# Patient Record
Sex: Female | Born: 1950 | Race: White | Hispanic: No | Smoking: Former smoker
Health system: Southern US, Community
[De-identification: ages and names within clinical notes are randomized; demographics above are authoritative.]

## PROBLEM LIST (undated history)

## (undated) ENCOUNTER — Emergency Department (HOSPITAL_COMMUNITY): Admission: EM | Payer: Medicare Other | Source: Home / Self Care

## (undated) DIAGNOSIS — N189 Chronic kidney disease, unspecified: Secondary | ICD-10-CM

## (undated) DIAGNOSIS — I509 Heart failure, unspecified: Secondary | ICD-10-CM

## (undated) DIAGNOSIS — Z9889 Other specified postprocedural states: Secondary | ICD-10-CM

## (undated) DIAGNOSIS — Z9289 Personal history of other medical treatment: Secondary | ICD-10-CM

## (undated) DIAGNOSIS — K449 Diaphragmatic hernia without obstruction or gangrene: Secondary | ICD-10-CM

## (undated) DIAGNOSIS — F419 Anxiety disorder, unspecified: Secondary | ICD-10-CM

## (undated) DIAGNOSIS — F319 Bipolar disorder, unspecified: Secondary | ICD-10-CM

## (undated) DIAGNOSIS — F411 Generalized anxiety disorder: Secondary | ICD-10-CM

## (undated) DIAGNOSIS — M1990 Unspecified osteoarthritis, unspecified site: Secondary | ICD-10-CM

## (undated) DIAGNOSIS — Z8719 Personal history of other diseases of the digestive system: Secondary | ICD-10-CM

## (undated) DIAGNOSIS — F329 Major depressive disorder, single episode, unspecified: Secondary | ICD-10-CM

## (undated) DIAGNOSIS — R0600 Dyspnea, unspecified: Secondary | ICD-10-CM

## (undated) DIAGNOSIS — G4700 Insomnia, unspecified: Secondary | ICD-10-CM

## (undated) DIAGNOSIS — K2270 Barrett's esophagus without dysplasia: Secondary | ICD-10-CM

## (undated) DIAGNOSIS — G250 Essential tremor: Secondary | ICD-10-CM

## (undated) DIAGNOSIS — K222 Esophageal obstruction: Secondary | ICD-10-CM

## (undated) DIAGNOSIS — Z86010 Personal history of colonic polyps: Secondary | ICD-10-CM

## (undated) DIAGNOSIS — Z87442 Personal history of urinary calculi: Secondary | ICD-10-CM

## (undated) DIAGNOSIS — Z8489 Family history of other specified conditions: Secondary | ICD-10-CM

## (undated) DIAGNOSIS — I639 Cerebral infarction, unspecified: Secondary | ICD-10-CM

## (undated) DIAGNOSIS — I219 Acute myocardial infarction, unspecified: Secondary | ICD-10-CM

## (undated) DIAGNOSIS — M797 Fibromyalgia: Secondary | ICD-10-CM

## (undated) DIAGNOSIS — R011 Cardiac murmur, unspecified: Secondary | ICD-10-CM

## (undated) DIAGNOSIS — T4145XA Adverse effect of unspecified anesthetic, initial encounter: Secondary | ICD-10-CM

## (undated) DIAGNOSIS — J42 Unspecified chronic bronchitis: Secondary | ICD-10-CM

## (undated) DIAGNOSIS — Z8711 Personal history of peptic ulcer disease: Secondary | ICD-10-CM

## (undated) DIAGNOSIS — I359 Nonrheumatic aortic valve disorder, unspecified: Secondary | ICD-10-CM

## (undated) DIAGNOSIS — J449 Chronic obstructive pulmonary disease, unspecified: Secondary | ICD-10-CM

## (undated) DIAGNOSIS — J189 Pneumonia, unspecified organism: Secondary | ICD-10-CM

## (undated) DIAGNOSIS — I5181 Takotsubo syndrome: Secondary | ICD-10-CM

## (undated) DIAGNOSIS — T8859XA Other complications of anesthesia, initial encounter: Secondary | ICD-10-CM

## (undated) DIAGNOSIS — I499 Cardiac arrhythmia, unspecified: Secondary | ICD-10-CM

## (undated) DIAGNOSIS — K219 Gastro-esophageal reflux disease without esophagitis: Secondary | ICD-10-CM

## (undated) DIAGNOSIS — R112 Nausea with vomiting, unspecified: Secondary | ICD-10-CM

## (undated) DIAGNOSIS — F32A Depression, unspecified: Secondary | ICD-10-CM

## (undated) DIAGNOSIS — E785 Hyperlipidemia, unspecified: Secondary | ICD-10-CM

## (undated) DIAGNOSIS — D649 Anemia, unspecified: Secondary | ICD-10-CM

## (undated) DIAGNOSIS — E119 Type 2 diabetes mellitus without complications: Secondary | ICD-10-CM

## (undated) DIAGNOSIS — I10 Essential (primary) hypertension: Secondary | ICD-10-CM

## (undated) DIAGNOSIS — R350 Frequency of micturition: Secondary | ICD-10-CM

## (undated) HISTORY — DX: Esophageal obstruction: K22.2

## (undated) HISTORY — DX: Diaphragmatic hernia without obstruction or gangrene: K44.9

## (undated) HISTORY — DX: Depression, unspecified: F32.A

## (undated) HISTORY — DX: Cerebral infarction, unspecified: I63.9

## (undated) HISTORY — DX: Personal history of colonic polyps: Z86.010

## (undated) HISTORY — DX: Gastro-esophageal reflux disease without esophagitis: K21.9

## (undated) HISTORY — DX: Insomnia, unspecified: G47.00

## (undated) HISTORY — DX: Barrett's esophagus without dysplasia: K22.70

## (undated) HISTORY — DX: Essential tremor: G25.0

## (undated) HISTORY — PX: CARDIAC CATHETERIZATION: SHX172

## (undated) HISTORY — DX: Generalized anxiety disorder: F41.1

## (undated) HISTORY — PX: COLONOSCOPY W/ BIOPSIES AND POLYPECTOMY: SHX1376

## (undated) HISTORY — PX: CARPAL TUNNEL RELEASE: SHX101

## (undated) HISTORY — PX: CATARACT EXTRACTION W/ INTRAOCULAR LENS  IMPLANT, BILATERAL: SHX1307

## (undated) HISTORY — DX: Chronic obstructive pulmonary disease, unspecified: J44.9

## (undated) HISTORY — DX: Unspecified osteoarthritis, unspecified site: M19.90

## (undated) HISTORY — PX: DILATION AND CURETTAGE OF UTERUS: SHX78

## (undated) HISTORY — DX: Major depressive disorder, single episode, unspecified: F32.9

## (undated) HISTORY — DX: Nonrheumatic aortic valve disorder, unspecified: I35.9

## (undated) HISTORY — DX: Hyperlipidemia, unspecified: E78.5

## (undated) HISTORY — PX: TUBAL LIGATION: SHX77

## (undated) HISTORY — PX: VAGINAL HYSTERECTOMY: SUR661

## (undated) HISTORY — PX: WRIST FLEXION TENDON TENOTOMIES AND PROXIMAL CORPECTOMY W/ WRIST ARTHRODESIS&ILIAC CREST BONE GRAFT: SHX2672

## (undated) HISTORY — DX: Essential (primary) hypertension: I10

---
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
## 1999-04-24 ENCOUNTER — Emergency Department (HOSPITAL_COMMUNITY): Admission: EM | Admit: 1999-04-24 | Discharge: 1999-04-24 | Payer: Self-pay | Admitting: Emergency Medicine

## 1999-04-24 ENCOUNTER — Encounter: Payer: Self-pay | Admitting: Emergency Medicine

## 2001-03-16 ENCOUNTER — Emergency Department (HOSPITAL_COMMUNITY): Admission: EM | Admit: 2001-03-16 | Discharge: 2001-03-16 | Payer: Self-pay

## 2001-03-30 ENCOUNTER — Ambulatory Visit (HOSPITAL_COMMUNITY): Admission: RE | Admit: 2001-03-30 | Discharge: 2001-03-30 | Payer: Self-pay | Admitting: Cardiovascular Disease

## 2001-05-28 ENCOUNTER — Ambulatory Visit (HOSPITAL_COMMUNITY): Admission: RE | Admit: 2001-05-28 | Discharge: 2001-05-28 | Payer: Self-pay | Admitting: Cardiovascular Disease

## 2002-02-07 ENCOUNTER — Encounter: Admission: RE | Admit: 2002-02-07 | Discharge: 2002-02-08 | Payer: Self-pay | Admitting: Specialist

## 2002-02-20 ENCOUNTER — Emergency Department (HOSPITAL_COMMUNITY): Admission: EM | Admit: 2002-02-20 | Discharge: 2002-02-20 | Payer: Self-pay | Admitting: Emergency Medicine

## 2002-02-20 ENCOUNTER — Encounter: Payer: Self-pay | Admitting: Emergency Medicine

## 2002-03-01 HISTORY — PX: LAPAROSCOPIC CHOLECYSTECTOMY: SUR755

## 2002-03-13 ENCOUNTER — Encounter: Payer: Self-pay | Admitting: Surgery

## 2002-03-13 ENCOUNTER — Encounter (INDEPENDENT_AMBULATORY_CARE_PROVIDER_SITE_OTHER): Payer: Self-pay | Admitting: Specialist

## 2002-03-13 ENCOUNTER — Observation Stay (HOSPITAL_COMMUNITY): Admission: RE | Admit: 2002-03-13 | Discharge: 2002-03-13 | Payer: Self-pay | Admitting: Surgery

## 2003-02-07 ENCOUNTER — Encounter: Payer: Self-pay | Admitting: Cardiovascular Disease

## 2003-02-07 ENCOUNTER — Ambulatory Visit (HOSPITAL_COMMUNITY): Admission: RE | Admit: 2003-02-07 | Discharge: 2003-02-07 | Payer: Self-pay | Admitting: Cardiovascular Disease

## 2003-08-02 HISTORY — PX: HEEL SPUR SURGERY: SHX665

## 2004-07-08 ENCOUNTER — Encounter: Payer: Self-pay | Admitting: Cardiovascular Disease

## 2004-08-17 ENCOUNTER — Emergency Department (HOSPITAL_COMMUNITY): Admission: EM | Admit: 2004-08-17 | Discharge: 2004-08-17 | Payer: Self-pay | Admitting: *Deleted

## 2004-08-28 ENCOUNTER — Emergency Department (HOSPITAL_COMMUNITY): Admission: EM | Admit: 2004-08-28 | Discharge: 2004-08-28 | Payer: Self-pay | Admitting: Emergency Medicine

## 2005-03-16 ENCOUNTER — Ambulatory Visit (HOSPITAL_COMMUNITY): Admission: RE | Admit: 2005-03-16 | Discharge: 2005-03-16 | Payer: Self-pay | Admitting: Cardiovascular Disease

## 2005-09-21 ENCOUNTER — Emergency Department (HOSPITAL_COMMUNITY): Admission: EM | Admit: 2005-09-21 | Discharge: 2005-09-21 | Payer: Self-pay | Admitting: Emergency Medicine

## 2007-06-07 ENCOUNTER — Encounter: Admission: RE | Admit: 2007-06-07 | Discharge: 2007-09-05 | Payer: Self-pay | Admitting: Orthopedic Surgery

## 2007-10-04 ENCOUNTER — Emergency Department (HOSPITAL_COMMUNITY): Admission: EM | Admit: 2007-10-04 | Discharge: 2007-10-05 | Payer: Self-pay | Admitting: Emergency Medicine

## 2008-09-28 ENCOUNTER — Emergency Department (HOSPITAL_COMMUNITY): Admission: EM | Admit: 2008-09-28 | Discharge: 2008-09-29 | Payer: Self-pay | Admitting: Emergency Medicine

## 2009-08-01 HISTORY — PX: SHOULDER OPEN ROTATOR CUFF REPAIR: SHX2407

## 2009-10-22 ENCOUNTER — Emergency Department (HOSPITAL_COMMUNITY): Admission: EM | Admit: 2009-10-22 | Discharge: 2009-10-22 | Payer: Self-pay | Admitting: Emergency Medicine

## 2010-03-03 ENCOUNTER — Ambulatory Visit (HOSPITAL_COMMUNITY): Admission: AD | Admit: 2010-03-03 | Discharge: 2010-03-04 | Payer: Self-pay | Admitting: Orthopedic Surgery

## 2010-03-31 ENCOUNTER — Encounter
Admission: RE | Admit: 2010-03-31 | Discharge: 2010-06-29 | Payer: Self-pay | Source: Home / Self Care | Admitting: Orthopedic Surgery

## 2010-05-11 ENCOUNTER — Encounter: Payer: Self-pay | Admitting: Gastroenterology

## 2010-08-05 ENCOUNTER — Ambulatory Visit: Payer: Self-pay | Admitting: Cardiovascular Disease

## 2010-09-13 ENCOUNTER — Encounter (INDEPENDENT_AMBULATORY_CARE_PROVIDER_SITE_OTHER): Payer: Self-pay | Admitting: *Deleted

## 2010-09-22 NOTE — Letter (Signed)
Summary: New Patient letter  [facility]  [address]   [place], [place] [address]   Phone: [phone]  Fax: [phone]       09/13/2010 MRN: [id number]  [name] [name] [address] [place], [place]  [address]  Dear [name] [name],  Welcome to the Gastroenterology Division at [facility].    You are scheduled to see Dr.  Jarold Motto on 10/19/2010 at 8:30 on the 3rd floor at [facility], [address]  We ask that you try to arrive at our office 15 minutes prior to your appointment time to allow for check-in.  We would like you to complete the enclosed self-administered evaluation form prior to your visit and bring it with you on the day of your appointment.  We will review it with you.  Also, please bring a complete list of all your medications or, if you prefer, bring the medication bottles and we will list them.  Please bring your insurance card so that we may make a copy of it.  If your insurance requires a referral to see a specialist, please bring your referral form from your primary care physician.  Co-payments are due at the time of your visit and may be paid by cash, check or credit card.     Your office visit will consist of a consult with your physician (includes a physical exam), any laboratory testing he/she may order, scheduling of any necessary diagnostic testing (e.g. x-ray, ultrasound, CT-scan), and scheduling of a procedure (e.g. Endoscopy, Colonoscopy) if required.  Please allow enough time on your schedule to allow for any/all of these possibilities.    If you cannot keep your appointment, please call [phone] to cancel or reschedule prior to your appointment date.  This allows [place] the opportunity to schedule an appointment for another patient in need of care.  If you do not cancel or reschedule by 5 p.m. the business day prior to your appointment date, you will be charged a $50.00 late cancellation/no-show fee.    Thank you for choosing  [facility] for your medical needs.  We appreciate the opportunity to care for you.  Please visit [place] at our website  to learn more about our practice.                     Sincerely,                                                             The Gastroenterology Division

## 2010-09-30 DIAGNOSIS — Z860100 Personal history of colon polyps, unspecified: Secondary | ICD-10-CM

## 2010-09-30 HISTORY — DX: Personal history of colon polyps, unspecified: Z86.0100

## 2010-09-30 HISTORY — DX: Personal history of colonic polyps: Z86.010

## 2010-10-11 LAB — HM COLONOSCOPY

## 2010-10-15 LAB — GLUCOSE, CAPILLARY
Glucose-Capillary: 143 mg/dL — ABNORMAL HIGH (ref 70–99)
Glucose-Capillary: 174 mg/dL — ABNORMAL HIGH (ref 70–99)
Glucose-Capillary: 175 mg/dL — ABNORMAL HIGH (ref 70–99)
Glucose-Capillary: 217 mg/dL — ABNORMAL HIGH (ref 70–99)

## 2010-10-16 LAB — COMPREHENSIVE METABOLIC PANEL
AST: 16 U/L (ref 0–37)
Alkaline Phosphatase: 59 U/L (ref 39–117)
CO2: 27 mEq/L (ref 19–32)
Calcium: 9.1 mg/dL (ref 8.4–10.5)
Chloride: 107 mEq/L (ref 96–112)
Creatinine, Ser: 0.61 mg/dL (ref 0.4–1.2)
Sodium: 141 mEq/L (ref 135–145)
Total Protein: 6.2 g/dL (ref 6.0–8.3)

## 2010-10-16 LAB — CBC
HCT: 35.4 % — ABNORMAL LOW (ref 36.0–46.0)
MCV: 91.3 fL (ref 78.0–100.0)
RDW: 13.9 % (ref 11.5–15.5)

## 2010-10-16 LAB — URINALYSIS, ROUTINE W REFLEX MICROSCOPIC
Bilirubin Urine: NEGATIVE
Glucose, UA: NEGATIVE mg/dL
Nitrite: NEGATIVE
pH: 6.5 (ref 5.0–8.0)

## 2010-10-16 LAB — DIFFERENTIAL
Eosinophils Relative: 4 % (ref 0–5)
Lymphocytes Relative: 29 % (ref 12–46)
Lymphs Abs: 1.8 10*3/uL (ref 0.7–4.0)
Monocytes Absolute: 0.4 10*3/uL (ref 0.1–1.0)
Neutro Abs: 3.7 10*3/uL (ref 1.7–7.7)
Neutrophils Relative %: 60 % (ref 43–77)

## 2010-10-16 LAB — APTT: aPTT: 33 seconds (ref 24–37)

## 2010-10-19 ENCOUNTER — Ambulatory Visit (INDEPENDENT_AMBULATORY_CARE_PROVIDER_SITE_OTHER): Payer: Medicaid Other | Admitting: Gastroenterology

## 2010-10-19 ENCOUNTER — Encounter: Payer: Self-pay | Admitting: Gastroenterology

## 2010-10-19 DIAGNOSIS — E119 Type 2 diabetes mellitus without complications: Secondary | ICD-10-CM

## 2010-10-19 DIAGNOSIS — R1013 Epigastric pain: Secondary | ICD-10-CM

## 2010-10-19 DIAGNOSIS — Z800 Family history of malignant neoplasm of digestive organs: Secondary | ICD-10-CM

## 2010-10-19 DIAGNOSIS — K5900 Constipation, unspecified: Secondary | ICD-10-CM

## 2010-10-19 DIAGNOSIS — K219 Gastro-esophageal reflux disease without esophagitis: Secondary | ICD-10-CM

## 2010-10-19 MED ORDER — PEG-KCL-NACL-NASULF-NA ASC-C 100 G PO SOLR: 1.0000 | Freq: Once | ORAL | Status: DC

## 2010-10-19 MED ORDER — PEG-KCL-NACL-NASULF-NA ASC-C 100 G PO SOLR: 1.0000 | Freq: Once | ORAL | Status: AC

## 2010-10-19 NOTE — Progress Notes (Signed)
History of Present Illness:  This is a 60 year old Caucasian female referred  For evaluation of years of acid reflux endoscopy performed within 20 years ago. She now has sub-xiphoid pain with rather marked regurgitation and increasing coughing and possible asthma. She denies true dysphasia, anorexia or weight loss. She has abnormal ulcer diabetes and associated mild constipation , but no history of rectal bleeding or melena. She is status post cholecystectomy in 2005 for cholelithiasis. She has not had previous colonoscopy , and she has a brother that died of colon cancer at age 41. Review of her labs showed normal CBC and metabolic profile. There is no history of fatty liver or other hepatobiliary complaints. She currently is on Nexium with some improvement in her symptomatology. There is no history of pancreatitis or hepatitis. She does have chronic anxiety syndrome and is on regular Xanax. She also uses frequent tramadol for right shoulder pain.   Past Medical History  Diagnosis Date  . Insomnia, unspecified   . Esophageal reflux   . Type II or unspecified type diabetes mellitus without mention of complication, not stated as uncontrolled   . HTN (hypertension)   . Hyperlipemia   . Asthma   . Osteoporosis   . Arthritis   . COPD (chronic obstructive pulmonary disease)   . Depression   . Stroke    Past Surgical History  Procedure Date  . Cholecystectomy 2005  . Partial hysterectomy   . Shoulder surgery 2011  . Tubal ligation   . Cardiac catheterization   . Tonsillectomy   . Foot surgery 2005    Left foot    reports that she has been smoking Cigarettes.  She has a 20 pack-year smoking history. She does not have any smokeless tobacco history on file. She reports that she drinks about .6 ounces of alcohol per week. She reports that she does not use illicit drugs. family history includes Breast cancer in her mother; Colon cancer (age of onset:52) in her brother; Diabetes in her maternal  grandfather; Heart disease in an unspecified family member; and Kidney disease in an unspecified family member. Allergies  Allergen Reactions  . Boniva (Ibandronate Sodium)     Jaw popping  . Ceclor (Cefaclor)   . Codeine   . Penicillins     Current outpatient prescriptions:albuterol (PROVENTIL HFA;VENTOLIN HFA) 108 (90 BASE) MCG/ACT inhaler, Take two puffs four times a day as needed , Disp: , Rfl: ;  ALPRAZolam (XANAX) 0.25 MG tablet, Take 0.25 mg by mouth 3 (three) times daily as needed.  , Disp: , Rfl: ;  aspirin 81 MG tablet, Take 81 mg by mouth daily.  , Disp: , Rfl: ;  azelastine (OPTIVAR) 0.05 % ophthalmic solution, Apply 1 drop to eye 2 (two) times daily.  , Disp: , Rfl:  esomeprazole (NEXIUM) 40 MG capsule, Take 40 mg by mouth daily before breakfast.  , Disp: , Rfl: ;  Fluticasone-Salmeterol (ADVAIR DISKUS) 500-50 MCG/DOSE AEPB, One puff twice a day as needed , Disp: , Rfl: ;  furosemide (LASIX) 40 MG tablet, Take 40 mg by mouth daily.  , Disp: , Rfl: ;  Olopatadine HCl (PATADAY) 0.2 % SOLN, Apply to eye. Use in each eye as needed , Disp: , Rfl:  Omega-3 Fatty Acids (FISH OIL) 1000 MG CAPS, Take by mouth daily.  , Disp: , Rfl: ;  rosuvastatin (CRESTOR) 20 MG tablet, Take 20 mg by mouth daily.  , Disp: , Rfl: ;  sertraline (ZOLOFT) 50 MG tablet, Take 50  mg by mouth at bedtime.  , Disp: , Rfl: ;  Teriparatide, Recombinant, (FORTEO) 600 MCG/2.4ML SOLN, Inject into the skin daily.  , Disp: , Rfl: ;  zolpidem (AMBIEN) 10 MG tablet, Take 10 mg by mouth at bedtime.  , Disp: , Rfl:  dexlansoprazole (DEXILANT) 60 MG capsule, Take 60 mg by mouth daily.  , Disp: , Rfl: ;  peg 3350 electrolyte powder (MOVIPREP) 100 G SOLR, Take 1 kit (100 g total) by mouth once., Disp: 1 kit, Rfl: 0;  DISCONTD: peg 3350 electrolyte powder (MOVIPREP) 100 G SOLR, Take 1 kit (100 g total) by mouth once., Disp: 1 kit, Rfl: 0  ROS: she does have some urinary incontinence he but denies rectal incontinency. The remainder of the  8 point ROS is negative     Physical Exam: General well developed well nourished patient in no acute distress, appearing their stated age Eyes PERRLA, no icterus fundoscopic exam per opthamologist Skin no lesions noted Neck supple, no adenopathy, no thyroid enlargement, no tenderness Chest clear to percussion and auscultation Heart no significant murmurs, gallops or rubs noted Abdomen no hepatosplenomegaly masses or tenderness, BS normal.   there is no hepatosplenomegaly but there is mild tenderness the subxiphoid area. Rectal inspection normal no fissures, or fistulae noted.  No masses or tenderness on digital exam. Stool guaiac negative. Extremities no acute joint lesions, edema, phlebitis or evidence of cellulitis. Neurologic patient oriented x 3, cranial nerves intact, no focal neurologic deficits noted. Psychological mental status normal and normal affect.      Assessment and plan:  This patient has worsening acid reflux and a probable large hiatal hernia. I've scheduled endoscopic exam because of her abdominal pain. We need to exclude Barrett's mucosa. Also because of her constipation and family history of colon carcinoma in her brother at age 76 , I've scheduled colonoscopy exam. A reflex regime has been reviewed with the patient , also the risk and benefits of endoscopy and colonoscopy. She understands has agreed to proceed.

## 2010-10-19 NOTE — Patient Instructions (Addendum)
Your procedure has been scheduled for 10/20/2010, please follow the seperate instructions.  You were given a handout on gerd and hiatal hernia today.  Your prescription(s) have been sent to you pharmacy.

## 2010-10-20 ENCOUNTER — Ambulatory Visit (AMBULATORY_SURGERY_CENTER): Payer: Medicaid Other | Admitting: Gastroenterology

## 2010-10-20 ENCOUNTER — Encounter: Payer: Self-pay | Admitting: Gastroenterology

## 2010-10-20 VITALS — BP 130/62 | HR 57 | Temp 97.0°F | Resp 18

## 2010-10-20 DIAGNOSIS — D126 Benign neoplasm of colon, unspecified: Secondary | ICD-10-CM

## 2010-10-20 DIAGNOSIS — K2970 Gastritis, unspecified, without bleeding: Secondary | ICD-10-CM

## 2010-10-20 DIAGNOSIS — K317 Polyp of stomach and duodenum: Secondary | ICD-10-CM

## 2010-10-20 DIAGNOSIS — K5790 Diverticulosis of intestine, part unspecified, without perforation or abscess without bleeding: Secondary | ICD-10-CM

## 2010-10-20 DIAGNOSIS — K449 Diaphragmatic hernia without obstruction or gangrene: Secondary | ICD-10-CM

## 2010-10-20 DIAGNOSIS — Z800 Family history of malignant neoplasm of digestive organs: Secondary | ICD-10-CM

## 2010-10-20 DIAGNOSIS — R109 Unspecified abdominal pain: Secondary | ICD-10-CM

## 2010-10-20 DIAGNOSIS — K2270 Barrett's esophagus without dysplasia: Secondary | ICD-10-CM

## 2010-10-20 DIAGNOSIS — K2950 Unspecified chronic gastritis without bleeding: Secondary | ICD-10-CM

## 2010-10-20 DIAGNOSIS — K222 Esophageal obstruction: Secondary | ICD-10-CM

## 2010-10-20 DIAGNOSIS — Z1211 Encounter for screening for malignant neoplasm of colon: Secondary | ICD-10-CM

## 2010-10-20 DIAGNOSIS — K635 Polyp of colon: Secondary | ICD-10-CM

## 2010-10-20 NOTE — Patient Instructions (Signed)
Take carafate 20 minutes after each meal and at bedtime Call office to schedule 3 week follow up.  Teaching information given for dilation diet, high fiber diet,polyps, diverticulosis, hiatal hernia, barrett's esophagus,gastritis.

## 2010-10-21 ENCOUNTER — Encounter: Payer: Self-pay | Admitting: Gastroenterology

## 2010-10-21 ENCOUNTER — Telehealth: Payer: Self-pay | Admitting: *Deleted

## 2010-10-21 MED ORDER — SUCRALFATE 1 GM/10ML PO SUSP: 1.0000 g | Freq: Four times a day (QID) | ORAL | Status: DC

## 2010-10-21 NOTE — Telephone Encounter (Signed)
Call in carafate suspension 1pt,refill prn, 1tbs pc and qhs

## 2010-10-21 NOTE — Telephone Encounter (Signed)
Follow up Call- Patient questions:  Do you have a fever, pain , or abdominal swelling? no Pain Score  0 *  Have you tolerated food without any problems? yes  Have you been able to return to your normal activities? yes  Do you have any questions about your discharge instructions: Diet   no Medications  yes Follow up visit  yes  Do you have questions or concerns about your Care? no  Actions: * If pain score is 4 or above: No action needed, pain <4.

## 2010-10-21 NOTE — Telephone Encounter (Signed)
Scheduled pt for f/u with Dr Jarold Motto for 11/16/10 @1 :45pm. Ordered her Carafate. Pt stated understanding.

## 2010-10-21 NOTE — Telephone Encounter (Signed)
LMOM for pt to call back.

## 2010-10-21 NOTE — Telephone Encounter (Signed)
Follow up call to patient today. Patient stating she went to pharmacy no prescription had been called in. Carafate ordered but not sent. Informed patient a telephone note would be sent to MD for Rx.

## 2010-10-22 DIAGNOSIS — K2960 Other gastritis without bleeding: Secondary | ICD-10-CM

## 2010-10-22 LAB — HELICOBACTER PYLORI SCREEN-BIOPSY: UREASE: NEGATIVE

## 2010-10-25 ENCOUNTER — Encounter: Payer: Self-pay | Admitting: Gastroenterology

## 2010-10-27 ENCOUNTER — Encounter: Payer: Self-pay | Admitting: Gastroenterology

## 2010-10-27 LAB — HM DEXA SCAN

## 2010-10-28 NOTE — Procedures (Signed)
Summary: Upper Endoscopy  Patient: [name] [name] Note: All result statuses are Final unless otherwise noted.  Tests: (1) Upper Endoscopy (EGD)   EGD Upper Endoscopy       DONE     [facility]     [address]     [place], [place]  [address]          ENDOSCOPY PROCEDURE REPORT          PATIENT:  [name], [name]  MR#:  [id number]     BIRTHDATE:  July 13, 1951, 59 yrs. old  GENDER:  female          ENDOSCOPIST:  Vania Rea. Jarold Motto, MD, [facility]     Referred by:  Rudi Heap, M.D.          PROCEDURE DATE:  10/20/2010     PROCEDURE:  EGD with biopsy for H. pylori 19147     ASA CLASS:  Class II     INDICATIONS:  GERD, dysphagia          MEDICATIONS:   There was residual sedation effect present from     prior procedure., Fentanyl 50 mcg IV, Versed 6 mg IV     TOPICAL ANESTHETIC:  Exactacain Spray          DESCRIPTION OF PROCEDURE:   After the risks benefits and     alternatives of the procedure were thoroughly explained, informed     consent was obtained.  The LB GIF-H180 [id number] endoscope was     introduced through the mouth and advanced to the second portion of     the duodenum, without limitations.  The instrument was slowly     withdrawn as the mucosa was fully examined.     <<PROCEDUREIMAGES>>          A hiatal hernia was found. 5 CM HH NOTED  Barrett's esophagus was     found.  Mild gastritis was found in the body and the antrum of the     stomach. CLO BX.DONE  A stricture was found at the     gastroesophageal junction. DILATED #75f DILATOR.SOME HEME,NO PAIN.     Retroflexed views revealed a hiatal hernia.    The scope was then     withdrawn from the patient and the procedure completed.          COMPLICATIONS:  None          ENDOSCOPIC IMPRESSION:     1) Hiatal hernia     2) Barrett's esophagus     3) Mild gastritis in the body and the antrum of the stomach     4) Stricture at the gastroesophageal junction     5) A hiatal hernia     1.R/O H.PYLORI   2.GERD AND STRICTURE     3.GASTRIC POLYPS.R/O ADENOMAS     RECOMMENDATIONS:     1) Await biopsy results     2) Clear liquids until, then soft foods rest iof day. Resume     prior diet tomorrow.     3) Rx CLO if positive     4) continue PPI          REPEAT EXAM:  No          ______________________________     Vania Rea. Jarold Motto, MD, [name]          CC:          n.     eSIGNED:   Vania Rea. Patterson at 10/20/2010 03:01 PM  [name], [name], [id number]  Note: An exclamation mark (!) indicates a result that was not dispersed into the flowsheet. Document Creation Date: 10/20/2010 3:01 PM _______________________________________________________________________  (1) Order result status: Final Collection or observation date-time: 10/20/2010 14:48 Requested date-time:  Receipt date-time:  Reported date-time:  Referring Physician:   Ordering Physician: Sheryn Bison [phone]) Specimen Source:  Source: Launa Grill Order Number: [id number] Lab site:

## 2010-10-28 NOTE — Procedures (Signed)
Summary: Colonoscopy  Patient: [name] Welcome Note: All result statuses are Final unless otherwise noted.  Tests: (1) Colonoscopy (COL)   COL Colonoscopy           DONE     [facility]     [address]     [place], [place]  [address]          COLONOSCOPY PROCEDURE REPORT          PATIENT:  [name] [name], [name] [name]  MR#:  [id number]     BIRTHDATE:  06-Jul-1951, 59 yrs. old  GENDER:  female     ENDOSCOPIST:  Vania Rea. Jarold Motto, MD, [facility]     REF. BY:  Rudi Heap, M.D.     PROCEDURE DATE:  10/20/2010     PROCEDURE:  Colonoscopy with snare polypectomy     ASA CLASS:  Class II     INDICATIONS:  Elevated Risk Screening brother with colon ca age     32.     MEDICATIONS:   Fentanyl 100 mg IV NEEDSPROPOFOL FOR NEXT CASE.,     Versed 10 mg IV          DESCRIPTION OF PROCEDURE:   After the risks benefits and     alternatives of the procedure were thoroughly explained, informed     consent was obtained.  Digital rectal exam was performed and     revealed no abnormalities.   The LB CF-H180AL [id number] endoscope     was introduced through the anus and advanced to the cecum, which     was identified by both the appendix and ileocecal valve, limited     by poor preparation.    The quality of the prep was poor, using     MoviPrep.  The instrument was then slowly withdrawn as the colon     was fully examined.     <<PROCEDUREIMAGES>>          FINDINGS:  There were multiple polyps identified and removed. 5-7     mm flat right colon and rectal polyps hot snare excised.see     pictures.  Mild diverticulosis was found in the sigmoid colon.     Retroflexed views in the rectum revealed no abnormalities.    The     scope was then withdrawn from the patient and the procedure     completed.          COMPLICATIONS:  None     ENDOSCOPIC IMPRESSION:     1) Polyps, multiple     2) Mild diverticulosis in the sigmoid colon     r/o adenomas.poor prep per n and v.     RECOMMENDATIONS:     1) Await  biopsy results     2) Repeat Colonoscopy in 3 years.     3) high fiber diet     REPEAT EXAM:  No          ______________________________     Vania Rea. Jarold Motto, MD, [name]          CC:          n.     eSIGNED:   Vania Rea. Jakerra Floyd at 10/20/2010 02:53 PM          Madison Hickman, [id number]  Note: An exclamation mark (!) indicates a result that was not dispersed into the flowsheet. Document Creation Date: 10/20/2010 2:53 PM _______________________________________________________________________  (1) Order result status: Final Collection or observation date-time: 10/20/2010 14:33 Requested date-time:  Receipt date-time:  Reported  date-time:  Referring Physician:   Ordering Physician: Sheryn Bison [id number]) Specimen Source:  Source: Launa Grill Order Number: [id number] Lab site:

## 2010-11-11 LAB — CK TOTAL AND CKMB (NOT AT ARMC)
CK, MB: 3.9 ng/mL (ref 0.3–4.0)
Relative Index: 2.6 — ABNORMAL HIGH (ref 0.0–2.5)

## 2010-11-11 LAB — TROPONIN I: Troponin I: <0.01 ng/mL (ref 0.00–0.06)

## 2010-11-16 ENCOUNTER — Encounter: Payer: Self-pay | Admitting: Gastroenterology

## 2010-11-16 ENCOUNTER — Ambulatory Visit (INDEPENDENT_AMBULATORY_CARE_PROVIDER_SITE_OTHER): Payer: Medicaid Other | Admitting: Gastroenterology

## 2010-11-16 VITALS — BP 130/68 | HR 64 | Ht 61.0 in | Wt 176.0 lb

## 2010-11-16 DIAGNOSIS — K222 Esophageal obstruction: Secondary | ICD-10-CM

## 2010-11-16 DIAGNOSIS — Z860100 Personal history of colon polyps, unspecified: Secondary | ICD-10-CM

## 2010-11-16 DIAGNOSIS — K219 Gastro-esophageal reflux disease without esophagitis: Secondary | ICD-10-CM

## 2010-11-16 LAB — URINALYSIS, ROUTINE W REFLEX MICROSCOPIC
Ketones, ur: NEGATIVE mg/dL
Protein, ur: NEGATIVE mg/dL
Urobilinogen, UA: 0.2 mg/dL (ref 0.0–1.0)

## 2010-11-16 LAB — CBC
RBC: 4.04 MIL/uL (ref 3.87–5.11)
WBC: 6.8 10*3/uL (ref 4.0–10.5)

## 2010-11-16 LAB — POCT I-STAT, CHEM 8
Chloride: 109 mEq/L (ref 96–112)
Creatinine, Ser: 0.6 mg/dL (ref 0.4–1.2)
Glucose, Bld: 182 mg/dL — ABNORMAL HIGH (ref 70–99)
HCT: 38 % (ref 36.0–46.0)
Potassium: 3.6 mEq/L (ref 3.5–5.1)
Sodium: 140 mEq/L (ref 135–145)

## 2010-11-16 LAB — HEPATIC FUNCTION PANEL
ALT: 29 U/L (ref 0–35)
AST: 29 U/L (ref 0–37)
Albumin: 4 g/dL (ref 3.5–5.2)
Bilirubin, Direct: <0.1 mg/dL (ref 0.0–0.3)

## 2010-11-16 LAB — DIFFERENTIAL
Lymphocytes Relative: 32 % (ref 12–46)
Lymphs Abs: 2.1 10*3/uL (ref 0.7–4.0)
Monocytes Relative: 7 % (ref 3–12)
Neutro Abs: 4 10*3/uL (ref 1.7–7.7)
Neutrophils Relative %: 57 % (ref 43–77)

## 2010-11-16 LAB — CK TOTAL AND CKMB (NOT AT ARMC)
CK, MB: 5.3 ng/mL — ABNORMAL HIGH (ref 0.3–4.0)
Relative Index: 2.4 (ref 0.0–2.5)

## 2010-11-16 LAB — RAPID URINE DRUG SCREEN, HOSP PERFORMED
Amphetamines: NOT DETECTED
Barbiturates: NOT DETECTED

## 2010-11-16 LAB — POCT CARDIAC MARKERS
Myoglobin, poc: 59.6 ng/mL (ref 12–200)
Troponin i, poc: <0.05 ng/mL (ref 0.00–0.09)

## 2010-11-16 MED ORDER — DEXLANSOPRAZOLE 60 MG PO CPDR: 60.0000 mg | DELAYED_RELEASE_CAPSULE | Freq: Every day | ORAL | Status: DC

## 2010-11-16 NOTE — Patient Instructions (Signed)
Your prescription(s) have been sent to you pharmacy.  Today you watched a movie on GERD.

## 2010-11-16 NOTE — Progress Notes (Signed)
History of Present Illness: This is a 60 year old Caucasian female with acid reflux who had recent endoscopy and colonoscopy and esophageal dilatation. She currently is asymptomatic on Dexilant 2 mg a day. At the time of colonoscopy she had several adenomatous polyps removed. Currently she is having regular bowel movements without abdominal pain, dysphagia, or reflux symptoms.    Current Medications, Allergies, Past Medical History, Past Surgical History, Family History and Social History were reviewed in [employer] record.   Assessment and plan: Reflux regime review with patient and patient video shown for her patient education. She is to continue Dexilant  and a high  fiber diet as tolerated. We will do when necessary dilations, followup colonoscopy in 5 years time. She is to continue other multiple medications listed and reviewed her chart per primary care. Encounter Diagnosis  Name Primary?  . Esophageal reflux Yes

## 2010-12-03 ENCOUNTER — Encounter: Payer: Self-pay | Admitting: Physician Assistant

## 2010-12-17 NOTE — H&P (Signed)
[place]  Patient:    [name] [name], [name] [name] Visit Number: [id number] MRN: [id number]          Service Type: EMS Location: [facility] Attending Physician:  Lorre Nick Dictated by:   Sandria Bales. Ezzard Standing, M.D. Admit Date:  02/20/2002   CC:         Thelma Barge, M.D., of [place] [facility]. Eden Emms, M.D. [facility]  Vesta Mixer, Montez Hageman., M.D.  Clinton D. Maple Hudson, M.D.  Maretta Bees. Vonita Moss, M.D.   History and Physical  DATE OF BIRTH:  1950-12-14  HISTORY OF PRESENT ILLNESS:  This is a 60 year old white female who is a patient of Dr. Thelma Barge of [place].  She started developing abdominal pain this morning with nausea and vomiting.  She called Dr. Orvan Falconer but never got an answer and came to the [facility] emergency room.  I was called after an evaluation revealed gallstones.  She denies any history of fatty food intolerance or prior stomach ulcers though she is on Nexium for some indigestion she has had.  No history of liver disease, pancreatic disease, or colon disease.  PAST MEDICAL HISTORY:  She has allergies to CECLOR and PENICILLIN by her history.  CURRENT MEDICATIONS:  She is not sure of all of them but includes nitroglycerin, cholesterol medicines, potassium, Nexium, fluid pill, Advair for her lungs, Flonase, Dextral LA, Plavix, and aspirin.  She is unsure of the doses of all these medicines.  PRIOR OPERATIONS:  Tubal ligation.  She had a partial hysterectomy in 1987 by an unknown physician.  She had kidney stone surgery in [place] in 1972 by an unknown physician, tonsils and adenoids in 1963.  She had a right arm ligament tear and repaired by Dr. Montez Morita in 1997.  She had a cardiac catheterization by Dr. Kristeen Miss in August 2002.  REVIEW OF SYSTEMS:  NEUROLOGIC:  She claims to have had two strokes when she was in her 27s some 20 years ago.  The etiology of these strokes is unclear. 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 I was able to find Dr. Namon Cirri note from a cardiac catheterization of August 2002, about a year ago, in which she had an ejection fraction of 55% and smooth and normal-appearing coronary arteries.  PULMONARY:  She has a history of asthma.  She is followed by Dr. Fannie Knee.  She quit smoking just two weeks ago.  GASTROINTESTINAL:  See history of present illness.  UROLOGIC:  She has had a history of kidney stones.  Again, she is followed by Dr. Larey Dresser most recently.  She is accompanied by her husband in the emergency room.  PHYSICAL EXAMINATION:  VITAL SIGNS:  Her temperature is 98.2, blood pressure 118/66, pulse 69, respirations 20.  GENERAL:  She is a well-nourished, mildly obese white female, alert and cooperative.  HEENT:  Unremarkable.  NECK:  Supple.  I feel no mass, no thyromegaly.  LUNGS:  Clear to auscultation.  HEART:  Regular rate and rhythm without murmur or rub.  ABDOMEN:  Soft at this time.  She has no guarding, no tenderness, no rebound.  RECTAL:  Exam reveals guaiac-negative stools.  EXTREMITIES:  Good strength in all four extremities.  NEUROLOGIC:  Grossly intact.  LABORATORY DATA:  Labs that I have show a white blood count  of 9300, a hemoglobin 13, hematocrit 39.  Her sodium is 137, potassium 3.8, chloride 109, CO2 20.  Her liver functions are all within normal limits.  Her urinalysis is unremarkable.  Her amylase is 51.  I reviewed her ultrasound which does show large gallstones which appear to be impacted.  IMPRESSION: 1. My feeling is that she is having and had biliary colic and she needs to    have a laparoscopic cholecystectomy but, first, that she is on two    medicines which are anticoagulants, the aspirin and the Plavix.  We could    just stop those for five to seven days before any surgery.  She is not    acutely at this time  and can be set up for elective surgery in the next two    to three weeks and I discussed with her.  I told her to stay on a low-fat    diet.  She will go to my office today, will get records from Dr. Elease Hashimoto,    Dr. Orvan Falconer, to make sure there are no other medical problems. 2. History of strokes but no permanent problems. 3. History of "MIs" but with recent cardiac catheterization which is reported    as negative. 4. Hypercholesterolemia. 5. Asthma followed by Dr. Maple Hudson. 6. Bladder problems followed by Dr. Larey Dresser. Dictated by:   Sandria Bales. Ezzard Standing, M.D. Attending Physician:  Lorre Nick DD:  02/20/02 TD:  02/20/02 Job: [id number]

## 2010-12-17 NOTE — Cardiovascular Report (Signed)
[name], [name]               ACCOUNT NO.:  [id number]   MEDICAL RECORD NO.:  [id number]          PATIENT TYPE:  OIB   LOCATION:  2853                         FACILITY:  [facility]   PHYSICIAN:  Vesta Mixer, M.D. DATE OF BIRTH:  Mar 29, 1951   DATE OF PROCEDURE:  03/16/2005  DATE OF DISCHARGE:                              CARDIAC CATHETERIZATION   Ms. [name] [name] is a 60 year old female with a history of syncope and episodes of  shortness breath.  She also has occasional episodes of chest tightness.  She  is referred for heart catheterization for further evaluation.   PROCEDURE:  Left heart catheterization with coronary angiography.   The right femoral artery was easily cannulated using a modified Seldinger  technique.   HEMODYNAMIC RESULTS:  LV pressure is 116/9 with an aortic pressure of  114/57.   ANGIOGRAPHY:  Left main:  The left main is smooth and normal.   The left anterior descending artery has only minimal luminal irregularities  between 5 and 10%.  There are certainly no clinically imported obstructions.  The diagonal vessels were relatively small throughout, otherwise  unremarkable.   The left circumflex artery is normal.  The obtuse marginal artery is normal.   The right coronary artery is moderate in size and is normal.  The posterior  descending artery the and posterolateral segment arteries were normal.   The left ventriculogram was performed in a 30 RAO position.  It revealed  left ventricle that is at the upper limits of normal.  The left ventricular  systolic function is at the lower limits of normal with an EF of around 50  and perhaps 55%.  There is no significant mitral regurgitation.   COMPLICATIONS:  None.   CONCLUSION:  1.  Essentially normal coronary arteries.  2.  Lower limits of normal left ventricular systolic function.  I do not      think that her symptoms are coming from a cardiac etiology.           ______________________________  Vesta Mixer, M.D.    PJN/MEDQ  D:  03/16/2005  T:  03/16/2005  Job:  [id number]   cc:   Orvan Falconer, M.D.  [place], [place]

## 2010-12-17 NOTE — Cardiovascular Report (Signed)
[facility]  Patient:    CHAYNA, SURRATT Visit Number: [id number] MRN: [id number]          Service Type: Attending:  Alvia Grove., M.D. Dictated by:   Alvia Grove., M.D. Proc. Date: 03/30/01   CC:         Cardiac Catheterization Lab   Cardiac Catheterization  HISTORY:  Mrs. Margo Aye is a 60 year old female with a recent onset of chest pain. She complains of exertional chest pain with even minor exertion.  She was referred for a heart catheterization for further evaluation.  CARDIOLOGIST:  Alvia Grove., M.D.  PROCEDURE:  Left heart catheterization with coronary angiography.  DESCRIPTION OF PROCEDURE:  The right femoral artery was easily cannulated using a modified Seldinger technique.  HEMODYNAMICS:  The left ventricular pressure was 113/18 with an aortic pressure was 108/62.  ANGIOGRAPHY:  The left main coronary artery is fairly short.  There is an ostial stenosis of approximately 20%.  There was some ventricularization of the 6-French catheter.  The 5-French catheter fit fairly well.  The left anterior descending artery is fairly smooth and normal.   It gives off several moderate size diagonal branches which are normal.  The first diagonal is a fairly large vessel; however, it is normal.  It is smooth and is without stenoses.  The left circumflex artery is a moderate size vessel and is normal.  The first obtuse marginal artery is normal.  The right coronary artery is large and dominant.  The posterior descending artery and the posterolateral segment artery are normal.  LEFT VENTRICULOGRAM:  A left ventriculogram was performed in the 30 RAO position.  It reveals overall normal left ventricular systolic function.  The ejection fraction is approximately 55%.  There is no mitral regurgitation. COMPLICATIONS:  None.  CONCLUSIONS: 1. Smooth and normal coronary arteries.  The mild narrowing of the left main  appears to be a fairly normal variant.  She does not appear to have    significant plaque. 2. Normal left ventricular systolic function. Dictated by:   Alvia Grove., M.D. Attending:  Alvia Grove., M.D. DD:  03/30/01 TD:  03/30/01 Job: [id number]

## 2010-12-17 NOTE — H&P (Signed)
[name] [name]               ACCOUNT NO.:  [id number]   MEDICAL RECORD NO.:  [id number]          PATIENT TYPE:  OIB   LOCATION:                               FACILITY:  [facility]   PHYSICIAN:  Vesta Mixer, M.D. DATE OF BIRTH:  October 06, 1950   DATE OF ADMISSION:  03/16/2005  DATE OF DISCHARGE:                                HISTORY & PHYSICAL   [name] [name] is a middle-aged female with a history of mild to moderate coronary  artery disease by heart catheterization in 2002.  She has continued to have  intermittent episodes of severe shortness of breath and some chest  discomfort.  She took several nitroglycerin last week because of her  episodes of chest discomfort.  She had a stress Cardiolite study recently  during which she was only able to get her heart rate up to 118.  She has had  episodes of weakness and dizziness.  She has not had any significant  arrhythmias on a [place] monitor.  She is now scheduled for heart  catheterization for further evaluation.   She denies any PND or orthopnea.  She is very intolerant of exercise.   CURRENT MEDICATIONS:  1.  Flonase twice a day.  2.  Nexium 40 mg p.o. b.i.d.  3.  Aspirin 81 mg a day.  4.  Zocor 80 mg a day.  5.  Furosemide 40 mg every other day.  6.  Fish oil once a day.  7.  Vitamin E, B12, and calcium once a day.   ALLERGIES:  None.   PAST MEDICAL HISTORY:  1.  History of generalized weakness.  2.  Diabetes mellitus.  3.  Hyperlipidemia.  4.  History of syncope.  5.  History of bradycardia.   FAMILY HISTORY:  Her father died at age 16 due to complications of  congestive heart failure.  Her mother has a history of aortic aneurysm.   REVIEW OF SYSTEMS:  Was reviewed and is essentially negative.   PHYSICAL EXAMINATION:  GENERAL:  She is a middle-aged white female in no  acute distress.  She is alert and oriented x3 and her mood and affect are  normal.  VITAL SIGNS:  Weight 170, blood pressure 118/70 with a heart  rate of 72.  HEENT:  2+ carotids.  She has no bruits.  No JVD.  No thyromegaly.  LUNGS:  Clear to auscultation.  HEART:  Regular rate.  S1, S2 with no murmurs, rubs, or gallops.  ABDOMEN:  Good bowel sounds.  It is nontender.  EXTREMITIES:  She has no clubbing, cyanosis, edema.  NEUROLOGIC:  Nonfocal.   [name] [name] presents with persistent episodes of weakness, dizziness, and now  has some chest pain relieved with nitroglycerin.  Given these symptoms I  would like to proceed with a heart catheterization.  We have discussed the  risks, benefits, and options of heart catheterization.  She understands and  agrees to proceed.  We will plan on doing an elective heart catheterization  within the week or so.  ______________________________  Vesta Mixer, M.D.     PJN/MEDQ  D:  03/07/2005  T:  03/07/2005  Job:  [id number]

## 2010-12-17 NOTE — Op Note (Signed)
[name] [name], [name] [name]                        ACCOUNT NO.:  [id number]   MEDICAL RECORD NO.:  [id number]                   PATIENT TYPE:  OBV   LOCATION:  0252                                 FACILITY:  [facility]   PHYSICIAN:  Sandria Bales. Ezzard Standing, M.D.               DATE OF BIRTH:  1950/08/31   DATE OF PROCEDURE:  03/13/2002  DATE OF DISCHARGE:  03/13/2002                                 OPERATIVE REPORT   CCS#:  [id number]   PREOPERATIVE DIAGNOSIS:  Chronic cholecystitis with cholelithiasis.   POSTOPERATIVE DIAGNOSIS:  Chronic cholecystitis with cholelithiasis.   PROCEDURE:  Laparoscopic cholecystectomy with intraoperative cholangiogram.   SURGEON:  Sandria Bales. Ezzard Standing, M.D.   FIRST ASSISTANT:  Sharlet Salina T. Hoxworth, M.D.   ANESTHESIA:  General endotracheal.   ESTIMATED BLOOD LOSS:  Minimal.   INDICATIONS FOR PROCEDURE:  The patient is a 60 year old white female  patient of Dr. Thelma Barge [place] who presented to the  [facility] Emergency Room on February 20, 2002, with abdominal pain. An  ultrasound at that time revealed large gallstones which appeared to be  impacting the neck of the gallbladder. She had normal liver functions.   She was sent home to be set up for elective laparoscopic cholecystectomy.  She was on Plavix and aspirin and I have asked her to stop both of these for  one week prior to her operation.   DESCRIPTION OF PROCEDURE:  The patient was placed in the supine position,  given a general endotracheal anesthetic. Her abdomen was prepped with  Betadine solution and sterilely draped. She was given 1 gm of Ancef at the  initiation of the procedure and she has PS stockings in place.   An infraumbilical incision was made with sharp dissection and carried down  to the abdominal cavity. A 30 degree laparoscope was inserted through a 10  mm Hasson trocar and the Hasson trocar secured with a #0 Vicryl suture. A  laparoscopic examination of her abdomen revealed  some adhesions in the lower  part of her abdomen. Her liver was unremarkable, stomach was unremarkable,  and what I could see of her bowel was unremarkable. Extra trocars were  placed, a 10 mm Ethicon trocar in the subxiphoid location and a 5 mm Ethicon  trocar in the right mid subcostal and a 5 mm Ethicon trocar in the right  lateral subcostal location. The gallbladder was grasped, rotated cephalad,  dissection carried out identifying the cystic duct/gallbladder junction.  There was noted to be a fair amount of fibrous tissue around this area  consistent with chronic cholecystitis.   The cystic artery was identified, triply Endo clipped and divided. The  cystic duct was identified, a clip placed on the gallbladder side of the  cystic duct. An intraoperative cholangiogram was then obtained using a cut-  off taut catheter, inserted through a 14-gauge Jelco catheter and inserted  to the side  of the cystic duct and then secured with an Endoclip.   The cholangiogram was shot using about 6 cc of 1/2 strength Hypaque solution  under direct fluoroscopy showing free flow of contrast down the cystic duct  into the common bile duct into the duodenum and refluxing up the hepatic  radicles. There was no obstruction, no mass and no leak. This led to a  normal intraoperative cholangiogram.   The taut catheter was then removed, the cystic duct triply Endo clipped and  divided. The gallbladder was then sharply and bluntly dissected from the  gallbladder bed. There were two additional clips placed along the  gallbladder bed for vessels. Bleeding was controlled primarily by hook Bovie  coagulation. Prior to complete revision of the gallbladder to the  gallbladder bed, the gallbladder bed and the triangle of Calot were  visualized and there was no bleeding and no bile leak. The gallbladder was  then divided, delivered through the umbilicus intact and sent to pathology.   The umbilical port was closed  with a #0 Vicryl suture. The other ports were  all visualized when the trocars were removed and there was no bleeding in  the trocar site. The skin at each site was closed with a 5-0 Monocryl  suture, painted with tincture of Benzoin and Steri-Strips and sterilely  dressed.   The patient tolerated the procedure well and was transported to the recovery  room in good condition. Sponge and needle counts were correct at the end of  the case.                                               Sandria Bales. Ezzard Standing, M.D.    DHN/MEDQ  D:  03/13/2002  T:  03/15/2002  Job:  [id number]   cc:   Thelma Barge, M.D.  [place], [place].   Vesta Mixer, M.D.   Clinton D. Maple Hudson, M.D.   Maretta Bees. Vonita Moss, M.D.

## 2011-01-03 ENCOUNTER — Encounter: Payer: Self-pay | Admitting: Physician Assistant

## 2011-04-25 LAB — POCT CARDIAC MARKERS
Myoglobin, poc: 42.2
Operator id: 4533
Operator id: 4533
Troponin i, poc: <0.05
Troponin i, poc: <0.05

## 2011-04-25 LAB — DIFFERENTIAL
Basophils Absolute: 0
Lymphocytes Relative: 35
Lymphs Abs: 2.2
Neutro Abs: 3.3
Neutrophils Relative %: 52

## 2011-04-25 LAB — CBC
HCT: 35 — ABNORMAL LOW
Platelets: 199
RDW: 14.1
WBC: 6.4

## 2011-04-25 LAB — BASIC METABOLIC PANEL
BUN: 8
Calcium: 9.7
Creatinine, Ser: 0.67
GFR calc non Af Amer: >60
Glucose, Bld: 109 — ABNORMAL HIGH
Potassium: 3.7

## 2012-02-06 ENCOUNTER — Ambulatory Visit (INDEPENDENT_AMBULATORY_CARE_PROVIDER_SITE_OTHER): Payer: Medicaid Other | Admitting: Cardiovascular Disease

## 2012-02-06 ENCOUNTER — Encounter: Payer: Self-pay | Admitting: Cardiovascular Disease

## 2012-02-06 VITALS — BP 125/68 | HR 56 | Ht 61.0 in | Wt 174.1 lb

## 2012-02-06 DIAGNOSIS — R079 Chest pain, unspecified: Secondary | ICD-10-CM

## 2012-02-06 DIAGNOSIS — E785 Hyperlipidemia, unspecified: Secondary | ICD-10-CM

## 2012-02-06 MED ORDER — ROSUVASTATIN CALCIUM 20 MG PO TABS: 20.0000 mg | ORAL_TABLET | Freq: Every day | ORAL | Status: DC

## 2012-02-06 MED ORDER — FUROSEMIDE 40 MG PO TABS: 40.0000 mg | ORAL_TABLET | Freq: Every day | ORAL | Status: DC

## 2012-02-06 MED ORDER — ATORVASTATIN CALCIUM 40 MG PO TABS: 40.0000 mg | ORAL_TABLET | Freq: Every day | ORAL | Status: DC

## 2012-02-06 NOTE — Patient Instructions (Signed)
Cleared for surgery/ paper form [facility] faxed back, no letter written,  Your physician wants you to follow-up in: 1 year You will receive a reminder letter in the mail two months in advance. If you don't receive a letter, please call our office to schedule the follow-up appointment.

## 2012-02-06 NOTE — Assessment & Plan Note (Signed)
Continues to have episodes of noncardiac chest pain. Number stress test and cardiac catheterizations in the past and she has within normal coronary arteries. I do not think that any of her current symptoms are related to coronary artery disease.  She is up to do most of her normal activities except for the is limited by her knee pain. She does have some shortness of breath and chest tightness after pushing on for 30 minutes. I suspect that this is because she is somewhat deconditioned.  We will see her again in one year. We'll check fasting labs and EKG at that time.

## 2012-02-06 NOTE — Progress Notes (Signed)
[name] [name] Date of Birth  November 10, 1950       [facility]    [facility] [address]  [address] [place], [place]  [address]   [place], [place]  [address] [phone]     [phone]   Fax  [phone]    Fax [phone]  Problem List: 1. Noncardiac chest pain 2. Normal heart catheterization 2006 3. Hyperlipidemia 4. Left knee pain  History of Present Illness:  [name] [name] has done well. It's been over 2 years since last saw her in the office. She has a history of noncardiac chest pain. She's had a normal heart catheterization the past. She complains of having some palpitations and shortness of breath particularly when she exerts herself but she's not been able to do much in the way of exercise with her bad knee. She  has been able to do all her normal activities without any significant problems.   Current Outpatient Prescriptions on File Prior to Visit  Medication Sig Dispense Refill  . albuterol (PROVENTIL HFA;VENTOLIN HFA) 108 (90 BASE) MCG/ACT inhaler Take two puffs four times a day as needed       . ALPRAZolam (XANAX) 0.25 MG tablet Take 0.25 mg by mouth 3 (three) times daily as needed.        [name] aspirin 81 MG tablet Take 81 mg by mouth daily.        [name] atorvastatin (LIPITOR) 40 MG tablet Take 40 mg by mouth daily.       [name] azelastine (OPTIVAR) 0.05 % ophthalmic solution Apply 1 drop to eye 2 (two) times daily.        . calcium carbonate (OS-CAL) 600 MG TABS Take 600 mg by mouth daily.       . Cholecalciferol (VITAMIN D) 1000 UNITS capsule Take 1,000 Units by mouth 2 (two) times a week.       [name] dexlansoprazole (DEXILANT) 60 MG capsule Take 1 capsule (60 mg total) by mouth daily.  30 capsule  6  . Fluticasone-Salmeterol (ADVAIR DISKUS) 500-50 MCG/DOSE AEPB One puff twice a day as needed       . furosemide (LASIX) 40 MG tablet Take 40 mg by mouth daily.        . Olopatadine HCl (PATADAY) 0.2 % SOLN Apply to eye. Use in each eye as  needed       . Omega-3 Fatty Acids (FISH OIL) 1000 MG CAPS Take by mouth daily.        . rosuvastatin (CRESTOR) 20 MG tablet Take 20 mg by mouth daily.        . sertraline (ZOLOFT) 50 MG tablet Take 50 mg by mouth at bedtime.        . vitamin B-12 (CYANOCOBALAMIN) 1000 MCG tablet Take 1,000 mcg by mouth daily.        [name] zolpidem (AMBIEN) 10 MG tablet Take 10 mg by mouth at bedtime.        . sucralfate (CARAFATE) 1 GM/10ML suspension Take 10 mLs (1 g total) by mouth 4 (four) times daily.  420 mL  1    Allergies  Allergen Reactions  . Boniva (Ibandronate Sodium)     Jaw popping  . Ceclor (Cefaclor)   . Codeine   . Penicillins     Past Medical History  Diagnosis Date  . Insomnia, unspecified   . Esophageal reflux   . Type II or unspecified type diabetes mellitus without mention of complication, not  stated as uncontrolled   . HTN (hypertension)   . Hyperlipemia   . Asthma   . Osteoporosis   . Arthritis   . COPD (chronic obstructive pulmonary disease)   . Depression   . Stroke   . Personal history of colonic polyps 09/2010    TUBULAR ADENOMAS (X3); NEGATIVE FOR HIGH GRADE DYSPLASIA OR MALIGNANCY.  . Barrett esophagus   . Hiatal hernia   . Esophageal stricture   . GERD (gastroesophageal reflux disease)   . GAD (generalized anxiety disorder)   . Aortic valve disorders     Past Surgical History  Procedure Date  . Cholecystectomy 2005  . Partial hysterectomy   . Shoulder surgery 2011  . Tubal ligation   . Cardiac catheterization   . Tonsillectomy   . Foot surgery 2005    Left foot  . Wrist flexion tendon tenotomies and proximal corpectomy w/ wrist arthrodesis&iliac crest bone graft     History  Smoking status  . Current Everyday Smoker -- 0.2 packs/day for 40 years  . Types: Cigarettes  Smokeless tobacco  . Never Used    History  Alcohol Use  . 0.6 oz/week  . 1 Cans of beer per week    occasionally    Family History  Problem Relation Age of Onset  . Breast  cancer Mother   . Diabetes Maternal Grandfather   . Kidney disease      Both sides of family  . Heart disease      Both sides of family  . Colon cancer Brother 11  . Colon polyps Brother   . Breast cancer Maternal Aunt   . Breast cancer Maternal Grandmother     Reviw of Systems:  Reviewed in the HPI.  All other systems are negative.  Physical Exam: Blood pressure 125/68, pulse 56, height 5\' 1"  (1.549 m), weight 174 lb 1.9 oz (78.98 kg). General: Well developed, well nourished, in no acute distress.  Head: Normocephalic, atraumatic, sclera non-icteric, mucus membranes are moist,   Neck: Supple. Carotids are 2 + without bruits. No JVD  Lungs: Clear bilaterally to auscultation.  Heart: regular rate.  normal  S1 S2. No murmurs, gallops or rubs.  Abdomen: Soft, non-tender, non-distended with normal bowel sounds. No hepatomegaly. No rebound/guarding. No masses.  Msk:  Strength and tone are normal  Extremities: No clubbing or cyanosis. No edema.  Distal pedal pulses are 2+ and equal bilaterally.  Neuro: Alert and oriented X 3. Moves all extremities spontaneously.  Psych:  Responds to questions appropriately with a normal affect.  ECG: 02/06/2012-sinus bradycardia at 58 beats a minute. She has no ST or T wave changes.  Assessment / Plan:

## 2012-02-14 ENCOUNTER — Telehealth: Payer: Self-pay | Admitting: Cardiovascular Disease

## 2012-02-14 NOTE — Telephone Encounter (Signed)
New  Problem:  Patient was seen in the office on  7/8 . clearance letter sent on 6/13. Office check on status of cardiac clearance .

## 2012-02-14 NOTE — Telephone Encounter (Signed)
Was faxe 7/8 as noted in ov note under pt instructions, will fax again. Toniann Fail informed

## 2012-02-21 ENCOUNTER — Encounter (HOSPITAL_COMMUNITY): Payer: Self-pay | Admitting: Pharmacy Technician

## 2012-02-21 NOTE — Patient Instructions (Signed)
[name] [name]  02/21/2012   Your procedure is scheduled on:  02/28/12 1230pm-130pm  Report to [facility] at 1000 AM.  Call this number if you have problems the morning of surgery: [phone]   Remember:   Do not eat food:After Midnight.  May have clear liquids:until Midnight . [name]  Take these medicines the morning of surgery with A SIP OF WATER:    Do not wear jewelry, make-up or nail polish.  Do not wear lotions, powders, or perfumes. .  Do not shave 48 hours prior to surgery.   Do not bring valuables to the hospital.  Contacts, dentures or bridgework may not be worn into surgery.  .   Patients discharged the day of surgery will not be allowed to drive home.  Name and phone number of your driver:   Special Instructions: CHG Shower Use Special Wash: 1/2 bottle night before surgery and 1/2 bottle morning of surgery. Shower chin to toes with CHG.  Wash face and private parts with regular soap.    Please read over the following fact sheets that you were given: MRSA Information, Incentive Spirometry Fact Sheet, coughing and deep breathing exercises

## 2012-02-22 ENCOUNTER — Encounter (HOSPITAL_COMMUNITY): Payer: Self-pay

## 2012-02-22 ENCOUNTER — Ambulatory Visit (HOSPITAL_COMMUNITY)
Admission: RE | Admit: 2012-02-22 | Discharge: 2012-02-22 | Disposition: A | Discharge status: Home / Self Care | Payer: Medicaid Other | Source: Ambulatory Visit | Attending: Orthopedic Surgery | Admitting: Orthopedic Surgery

## 2012-02-22 ENCOUNTER — Encounter (HOSPITAL_COMMUNITY)
Admission: RE | Admit: 2012-02-22 | Discharge: 2012-02-22 | Disposition: A | Discharge status: Home / Self Care | Payer: Medicaid Other | Source: Ambulatory Visit | Attending: Orthopedic Surgery | Admitting: Orthopedic Surgery

## 2012-02-22 DIAGNOSIS — I10 Essential (primary) hypertension: Secondary | ICD-10-CM | POA: Insufficient documentation

## 2012-02-22 DIAGNOSIS — Z01812 Encounter for preprocedural laboratory examination: Secondary | ICD-10-CM | POA: Insufficient documentation

## 2012-02-22 DIAGNOSIS — IMO0002 Reserved for concepts with insufficient information to code with codable children: Secondary | ICD-10-CM | POA: Insufficient documentation

## 2012-02-22 DIAGNOSIS — X58XXXA Exposure to other specified factors, initial encounter: Secondary | ICD-10-CM | POA: Insufficient documentation

## 2012-02-22 HISTORY — DX: Cardiac arrhythmia, unspecified: I49.9

## 2012-02-22 HISTORY — DX: Chronic kidney disease, unspecified: N18.9

## 2012-02-22 HISTORY — DX: Anxiety disorder, unspecified: F41.9

## 2012-02-22 HISTORY — DX: Acute myocardial infarction, unspecified: I21.9

## 2012-02-22 HISTORY — DX: Pneumonia, unspecified organism: J18.9

## 2012-02-22 HISTORY — DX: Cardiac murmur, unspecified: R01.1

## 2012-02-22 HISTORY — DX: Anemia, unspecified: D64.9

## 2012-02-22 LAB — APTT: aPTT: 31 seconds (ref 24–37)

## 2012-02-22 LAB — COMPREHENSIVE METABOLIC PANEL
ALT: 34 U/L (ref 0–35)
AST: 27 U/L (ref 0–37)
Albumin: 3.8 g/dL (ref 3.5–5.2)
Alkaline Phosphatase: 73 U/L (ref 39–117)
BUN: 10 mg/dL (ref 6–23)
CO2: 23 mEq/L (ref 19–32)
Calcium: 9.3 mg/dL (ref 8.4–10.5)
Chloride: 103 mEq/L (ref 96–112)
Creatinine, Ser: 0.53 mg/dL (ref 0.50–1.10)
GFR calc Af Amer: >90 mL/min (ref >90)
GFR calc non Af Amer: >90 mL/min (ref >90)
Glucose, Bld: 165 mg/dL — ABNORMAL HIGH (ref 70–99)
Potassium: 3.8 mEq/L (ref 3.5–5.1)
Sodium: 138 mEq/L (ref 135–145)
Total Bilirubin: 0.2 mg/dL — ABNORMAL LOW (ref 0.3–1.2)
Total Protein: 7.1 g/dL (ref 6.0–8.3)

## 2012-02-22 LAB — PROTIME-INR
INR: 0.95 (ref 0.00–1.49)
Prothrombin Time: 12.9 seconds (ref 11.6–15.2)

## 2012-02-22 LAB — URINALYSIS, ROUTINE W REFLEX MICROSCOPIC
Bilirubin Urine: NEGATIVE
Glucose, UA: NEGATIVE mg/dL
Hgb urine dipstick: NEGATIVE
Ketones, ur: NEGATIVE mg/dL
Leukocytes, UA: NEGATIVE
Nitrite: NEGATIVE
Protein, ur: NEGATIVE mg/dL
Specific Gravity, Urine: 1.015 (ref 1.005–1.030)
Urobilinogen, UA: 0.2 mg/dL (ref 0.0–1.0)
pH: 6 (ref 5.0–8.0)

## 2012-02-22 LAB — DIFFERENTIAL
Basophils Absolute: 0 10*3/uL (ref 0.0–0.1)
Basophils Relative: 1 % (ref 0–1)
Eosinophils Absolute: 0.2 10*3/uL (ref 0.0–0.7)
Eosinophils Relative: 3 % (ref 0–5)
Lymphocytes Relative: 34 % (ref 12–46)
Lymphs Abs: 2 10*3/uL (ref 0.7–4.0)
Monocytes Absolute: 0.6 10*3/uL (ref 0.1–1.0)
Monocytes Relative: 10 % (ref 3–12)
Neutro Abs: 3.1 10*3/uL (ref 1.7–7.7)
Neutrophils Relative %: 53 % (ref 43–77)

## 2012-02-22 LAB — SURGICAL PCR SCREEN
MRSA, PCR: NEGATIVE
Staphylococcus aureus: POSITIVE — AB

## 2012-02-22 LAB — CBC
HCT: 35.8 % — ABNORMAL LOW (ref 36.0–46.0)
Hemoglobin: 11.9 g/dL — ABNORMAL LOW (ref 12.0–15.0)
MCH: 27.9 pg (ref 26.0–34.0)
MCHC: 33.2 g/dL (ref 30.0–36.0)
MCV: 84 fL (ref 78.0–100.0)
Platelets: 227 10*3/uL (ref 150–400)
RBC: 4.26 MIL/uL (ref 3.87–5.11)
RDW: 15.9 % — ABNORMAL HIGH (ref 11.5–15.5)
WBC: 5.9 10*3/uL (ref 4.0–10.5)

## 2012-02-22 NOTE — H&P (Signed)
  [name] [name] DOB: [birth year]  Chief complaint: left knee pain  History of Present Illness The patient is a 61 year old female who is scheduled for a left knee arthroscopy with medial menisectomy by Dr. Darrelyn Hillock on Tuesday February 28, 2012. Symptoms reported today include pain, popping, pain with weightbearing and difficulty ambulating. The patient feels that they are doing poorly and report their pain level to be mild to moderate.  Her chief complaint is pain in her left knee and her knee giving away. She had minimal relief with cortisone injections. MRI revealed torn medial meniscus in the left knee.    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  Sprain/strain, shoulder/arm NOS (840.9).  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  Sprain/strain, ankle NOS (845.00).  Diabetes Mellitus, Type II Gastroesophageal Reflux Disease Hypercholesterolemia Osteoporosis    Allergies PENICILLIN.  CODEINE.  CECLOR.  BONIVA.    Family History Cancer. mother Cerebrovascular Accident. father Chronic Obstructive Lung Disease. father and brother Heart disease in female family member before age 63   Social History Alcohol use. current drinker; drinks beer; only occasionally per week Children. 3 Current work status. disabled Drug/Alcohol Rehab (Currently). no Exercise. Exercises daily; does other Illicit drug use. no Living situation. live alone Marital status. divorced Number of flights of stairs before winded. 1 Pain Contract. no Previously in rehab. no Tobacco / smoke exposure. yes Tobacco use. current some days smoker; smoke(d) less than 1/2 pack(s) per day   Medication History Lasix ( Oral) Active. Albuterol Sulfate ( Inhalation) Active. Xanax ( Oral)  Active. Lipitor ( Oral)  Active. Dexilant ( Oral) Active. Spirulina ( Oral)  Active. Zolpidem Tartrate ( Oral)  Active.    Past Surgical  History Carpal Tunnel Repair. right Foot Surgery. left Gallbladder Surgery. laporoscopic Hysterectomy. partial (non-cancerous) Rotator Cuff Repair. right Tonsillectomy Tubal Ligation   Review of Systems General:Present- Fever. Not Present- Chills, Night Sweats, Appetite Loss, Fatigue, Feeling sick, Weight Gain and Weight Loss. Skin:Not Present- Itching, Rash, Skin Color Changes, Ulcer, Psoriasis and Change in Hair or Nails. HEENT:Present- Ringing in the Ears. Not Present- Sensitivity to light, Nose Bleed, Visual Loss and Decreased Hearing. Neck:Not Present- Swollen Glands and Neck Mass. Respiratory:Present- Chronic Cough. Not Present- Shortness of breath, Snoring and Bloody sputum. Cardiovascular:Present- Palpitations. Not Present- Shortness of Breath, Chest Pain, Swelling of Extremities and Leg Cramps. Gastrointestinal:Present- Heartburn. Not Present- Bloody Stool, Abdominal Pain, Vomiting, Nausea and Incontinence of Stool. Female Genitourinary:Not Present- Blood in Urine, Irregular/missing periods, Frequency, Incontinence and Nocturia. Musculoskeletal:Present- Joint Pain. Not Present- Muscle Weakness, Muscle Pain, Joint Stiffness, Joint Swelling and Back Pain. Neurological:Not Present- Tingling, Numbness, Burning, Tremor, Headaches and Dizziness. Psychiatric:Not Present- Anxiety, Depression and Memory Loss. Endocrine:Not Present- Cold Intolerance, Heat Intolerance, Excessive hunger and Excessive Thirst. Hematology:Not Present- Abnormal Bleeding, Abnormal bruising, Anemia and Blood Clots.   Physical Exam She has pain and swelling in her knee. She has a peculiar clunking sensation in her knee. She does have a knee effusion. The popliteal space is fine. The calf is soft and nontender. No phlebitis. Circulation is intact. The hips exhibit normal painless ROM. Heart sounds normal. No murmurs. Lungs clear to auscultation. Abdomen soft and nontender. Neck supple. EOM  intact.     Assessment and Plan Medial meniscus tear, left knee Left knee arthroscopy with medial menisectomy     Dimitri Ped, PA-C

## 2012-02-23 ENCOUNTER — Encounter (HOSPITAL_COMMUNITY): Payer: Self-pay

## 2012-02-23 NOTE — Progress Notes (Signed)
EKG 02/06/12 chart  

## 2012-02-23 NOTE — Progress Notes (Signed)
02/06/12 LOV note with cardiology in [facility] Stress test 2004 - chart  Cath 2006 - chart

## 2012-02-23 NOTE — Progress Notes (Signed)
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 According to office visit note from cardiology ( Dr Elease Hashimoto ) pt has had episoded of noncardiac chest pain in the past and cardiac caths have been normal.

## 2012-02-28 ENCOUNTER — Ambulatory Visit (HOSPITAL_COMMUNITY): Payer: Medicaid Other | Admitting: Anesthesiology

## 2012-02-28 ENCOUNTER — Encounter (HOSPITAL_COMMUNITY): Payer: Self-pay | Admitting: Anesthesiology

## 2012-02-28 ENCOUNTER — Encounter (HOSPITAL_COMMUNITY): Payer: Self-pay | Admitting: *Deleted

## 2012-02-28 ENCOUNTER — Ambulatory Visit (HOSPITAL_COMMUNITY)
Admission: RE | Admit: 2012-02-28 | Discharge: 2012-02-28 | Disposition: A | Discharge status: Home / Self Care | Payer: Medicaid Other | Source: Ambulatory Visit | Attending: Orthopedic Surgery | Admitting: Orthopedic Surgery

## 2012-02-28 ENCOUNTER — Encounter (HOSPITAL_COMMUNITY)
Admission: RE | Disposition: A | Discharge status: Home / Self Care | Payer: Self-pay | Source: Ambulatory Visit | Attending: Orthopedic Surgery

## 2012-02-28 DIAGNOSIS — M2240 Chondromalacia patellae, unspecified knee: Secondary | ICD-10-CM | POA: Insufficient documentation

## 2012-02-28 DIAGNOSIS — E119 Type 2 diabetes mellitus without complications: Secondary | ICD-10-CM | POA: Insufficient documentation

## 2012-02-28 DIAGNOSIS — X58XXXA Exposure to other specified factors, initial encounter: Secondary | ICD-10-CM | POA: Insufficient documentation

## 2012-02-28 DIAGNOSIS — K219 Gastro-esophageal reflux disease without esophagitis: Secondary | ICD-10-CM | POA: Insufficient documentation

## 2012-02-28 DIAGNOSIS — M1710 Unilateral primary osteoarthritis, unspecified knee: Secondary | ICD-10-CM | POA: Insufficient documentation

## 2012-02-28 DIAGNOSIS — Z9889 Other specified postprocedural states: Secondary | ICD-10-CM

## 2012-02-28 DIAGNOSIS — IMO0002 Reserved for concepts with insufficient information to code with codable children: Secondary | ICD-10-CM | POA: Insufficient documentation

## 2012-02-28 DIAGNOSIS — Z79899 Other long term (current) drug therapy: Secondary | ICD-10-CM | POA: Insufficient documentation

## 2012-02-28 DIAGNOSIS — E7800 Pure hypercholesterolemia, unspecified: Secondary | ICD-10-CM | POA: Insufficient documentation

## 2012-02-28 DIAGNOSIS — M810 Age-related osteoporosis without current pathological fracture: Secondary | ICD-10-CM | POA: Insufficient documentation

## 2012-02-28 HISTORY — PX: KNEE ARTHROSCOPY: SHX127

## 2012-02-28 LAB — GLUCOSE, CAPILLARY: Glucose-Capillary: 149 mg/dL — ABNORMAL HIGH (ref 70–99)

## 2012-02-28 SURGERY — ARTHROSCOPY, KNEE
Anesthesia: General | Site: Knee | Laterality: Left | Wound class: Clean

## 2012-02-28 MED ORDER — SODIUM CHLORIDE 0.9 % IR SOLN: Status: DC | PRN

## 2012-02-28 MED ORDER — BACITRACIN-NEOMYCIN-POLYMYXIN 400-5-5000 EX OINT
TOPICAL_OINTMENT | CUTANEOUS | Status: AC
  Filled 2012-02-28: qty 1

## 2012-02-28 MED ORDER — FENTANYL CITRATE 0.05 MG/ML IJ SOLN
25.0000 ug | INTRAMUSCULAR | Status: DC | PRN
  Administered 2012-02-28 (×3): 50 ug via INTRAVENOUS

## 2012-02-28 MED ORDER — HYDROMORPHONE HCL PF 1 MG/ML IJ SOLN: 0.5000 mg | INTRAMUSCULAR | Status: DC | PRN

## 2012-02-28 MED ORDER — LACTATED RINGERS IR SOLN
Status: DC | PRN
  Administered 2012-02-28 (×2): 3000 mL

## 2012-02-28 MED ORDER — LACTATED RINGERS IV SOLN
INTRAVENOUS | Status: DC
  Administered 2012-02-28: 1000 mL via INTRAVENOUS

## 2012-02-28 MED ORDER — CLINDAMYCIN PHOSPHATE 900 MG/50ML IV SOLN
900.0000 mg | INTRAVENOUS | Status: AC
  Administered 2012-02-28: 900 mg via INTRAVENOUS
  Filled 2012-02-28: qty 50

## 2012-02-28 MED ORDER — BUPIVACAINE-EPINEPHRINE PF 0.25-1:200000 % IJ SOLN
INTRAMUSCULAR | Status: AC
  Filled 2012-02-28: qty 30

## 2012-02-28 MED ORDER — CHLORHEXIDINE GLUCONATE 4 % EX LIQD
60.0000 mL | Freq: Once | CUTANEOUS | Status: DC
  Filled 2012-02-28: qty 60

## 2012-02-28 MED ORDER — FENTANYL CITRATE 0.05 MG/ML IJ SOLN
INTRAMUSCULAR | Status: AC
  Filled 2012-02-28: qty 2

## 2012-02-28 MED ORDER — BACITRACIN ZINC 500 UNIT/GM EX OINT
TOPICAL_OINTMENT | CUTANEOUS | Status: DC | PRN
  Administered 2012-02-28: 1 via TOPICAL

## 2012-02-28 MED ORDER — FENTANYL CITRATE 0.05 MG/ML IJ SOLN
INTRAMUSCULAR | Status: DC | PRN
  Administered 2012-02-28: 25 ug via INTRAVENOUS
  Administered 2012-02-28: 50 ug via INTRAVENOUS
  Administered 2012-02-28: 25 ug via INTRAVENOUS
  Administered 2012-02-28: 50 ug via INTRAVENOUS

## 2012-02-28 MED ORDER — MIDAZOLAM HCL 5 MG/5ML IJ SOLN
INTRAMUSCULAR | Status: DC | PRN
  Administered 2012-02-28: 2 mg via INTRAVENOUS

## 2012-02-28 MED ORDER — LACTATED RINGERS IV SOLN: INTRAVENOUS | Status: DC

## 2012-02-28 MED ORDER — ACETAMINOPHEN 10 MG/ML IV SOLN
INTRAVENOUS | Status: DC | PRN
  Administered 2012-02-28: 1000 mg via INTRAVENOUS

## 2012-02-28 MED ORDER — HYDROCODONE-ACETAMINOPHEN 7.5-325 MG PO TABS
1.0000 | ORAL_TABLET | Freq: Four times a day (QID) | ORAL | Status: DC | PRN
  Administered 2012-02-28: 1 via ORAL
  Filled 2012-02-28: qty 1

## 2012-02-28 MED ORDER — ACETAMINOPHEN 10 MG/ML IV SOLN
INTRAVENOUS | Status: AC
  Filled 2012-02-28: qty 100

## 2012-02-28 MED ORDER — PROPOFOL 10 MG/ML IV EMUL
INTRAVENOUS | Status: DC | PRN
  Administered 2012-02-28: 150 mg via INTRAVENOUS

## 2012-02-28 MED ORDER — BUPIVACAINE-EPINEPHRINE 0.25% -1:200000 IJ SOLN
INTRAMUSCULAR | Status: DC | PRN
  Administered 2012-02-28: 30 mL

## 2012-02-28 MED ORDER — CLINDAMYCIN PHOSPHATE 900 MG/50ML IV SOLN
INTRAVENOUS | Status: AC
  Filled 2012-02-28: qty 50

## 2012-02-28 MED ORDER — HYDROCODONE-ACETAMINOPHEN 7.5-750 MG PO TABS: 1.0000 | ORAL_TABLET | Freq: Four times a day (QID) | ORAL | Status: AC | PRN

## 2012-02-28 MED ORDER — HYDROMORPHONE HCL PF 1 MG/ML IJ SOLN
INTRAMUSCULAR | Status: AC
  Administered 2012-02-28: 1 mg
  Filled 2012-02-28: qty 1

## 2012-02-28 MED ORDER — ONDANSETRON HCL 4 MG/2ML IJ SOLN
INTRAMUSCULAR | Status: DC | PRN
  Administered 2012-02-28: 4 mg via INTRAVENOUS

## 2012-02-28 MED ORDER — LIDOCAINE HCL (CARDIAC) 20 MG/ML IV SOLN
INTRAVENOUS | Status: DC | PRN
  Administered 2012-02-28: 80 mg via INTRAVENOUS

## 2012-02-28 SURGICAL SUPPLY — 25 items
BANDAGE ELASTIC 4 VELCRO ST LF (GAUZE/BANDAGES/DRESSINGS) ×2 IMPLANT
BLADE GREAT WHITE 4.2 (BLADE) ×2 IMPLANT
BNDG COHESIVE 6X5 TAN STRL LF (GAUZE/BANDAGES/DRESSINGS) ×2 IMPLANT
CLOTH BEACON ORANGE TIMEOUT ST (SAFETY) ×2 IMPLANT
DRAPE LG THREE QUARTER DISP (DRAPES) ×2 IMPLANT
DRSG ADAPTIC 3X8 NADH LF (GAUZE/BANDAGES/DRESSINGS) ×1 IMPLANT
DRSG PAD ABDOMINAL 8X10 ST (GAUZE/BANDAGES/DRESSINGS) ×3 IMPLANT
DURAPREP 26ML APPLICATOR (WOUND CARE) ×2 IMPLANT
GLOVE BIOGEL PI IND STRL 8 (GLOVE) ×1 IMPLANT
GLOVE BIOGEL PI INDICATOR 8 (GLOVE) ×1
GLOVE ECLIPSE 8.0 STRL XLNG CF (GLOVE) ×2 IMPLANT
GOWN STRL REIN XL XLG (GOWN DISPOSABLE) ×4 IMPLANT
MANIFOLD NEPTUNE II (INSTRUMENTS) ×2 IMPLANT
PACK ARTHROSCOPY WL (CUSTOM PROCEDURE TRAY) ×2 IMPLANT
PACK ICE MAXI GEL EZY WRAP (MISCELLANEOUS) ×2 IMPLANT
PAD MASON LEG HOLDER (PIN) ×2 IMPLANT
PADDING CAST COTTON 6X4 STRL (CAST SUPPLIES) ×1 IMPLANT
SET ARTHROSCOPY TUBING (MISCELLANEOUS) ×2
SET ARTHROSCOPY TUBING LN (MISCELLANEOUS) ×1 IMPLANT
SPONGE GAUZE 4X4 12PLY (GAUZE/BANDAGES/DRESSINGS) ×1 IMPLANT
SUT ETHILON 3 0 PS 1 (SUTURE) ×2 IMPLANT
TOWEL OR 17X26 10 PK STRL BLUE (TOWEL DISPOSABLE) ×6 IMPLANT
TUBING CONNECTING 10 (TUBING) ×2 IMPLANT
WAND 90 DEG TURBOVAC W/CORD (SURGICAL WAND) ×1 IMPLANT
WRAP KNEE MAXI GEL POST OP (GAUZE/BANDAGES/DRESSINGS) ×6 IMPLANT

## 2012-02-28 NOTE — Brief Op Note (Signed)
02/28/2012  2:04 PM  PATIENT:  [name] [name]  61 y.o. female  PRE-OPERATIVE DIAGNOSIS:  Left Knee Medial meniscal tear and Degenerative arthritis  POST-OPERATIVE DIAGNOSIS:  left knee medial meniscal tear and Degenerative arthritis  PROCEDURE:  Procedure(s) (LRB): ARTHROSCOPY KNEE (Left) with Medial Menisectomy and Abraison chondroplasties of Patello-Femoral Joint and Medial Femoral Condyle and Microfracture of Medial Femoral Condyle,and Diagnostic Arthroscopy. SURGEON:  Surgeon(s) and Role:    * Jacki Cones, MD - Primary    ASSISTANTS: Nurse   ANESTHESIA:   general  EBL:     BLOOD ADMINISTERED:none  DRAINS: none   LOCAL MEDICATIONS USED:  MARCAINE 0.25%,30cc with Epinephrine   SPECIMEN:  No Specimen  DISPOSITION OF SPECIMEN:  N/A  COUNTS:  YES  TOURNIQUET:  * No tourniquets in log *  DICTATION: .Other Dictation: Dictation Number [id number]  PLAN OF CARE: Discharge to home after PACU  PATIENT DISPOSITION:  PACU - hemodynamically stable.   Delay start of Pharmacological VTE agent (>24hrs) due to surgical blood loss or risk of bleeding: yes

## 2012-02-28 NOTE — Preoperative (Signed)
Beta Blockers   Reason not to administer Beta Blockers:Not Applicable 

## 2012-02-28 NOTE — Progress Notes (Addendum)
Discharge instructions with family.  Teach back done on medications, aspirin, ice sleeve, and transfer, stairs, and walker. Pt transferred to wheelchair and has increase pain left knee. Tears come to her eyes and she states that it feels like she never took any pain meds. Paged Dr Lequita Halt who is on call for Dr Darrelyn Hillock to get order for addition PO pain medication prior to discharge.  Pt states she wants to leave as her brothers are waiting on her. Will page Dr Lequita Halt again and let MD know she uses [facility] in [place].

## 2012-02-28 NOTE — Anesthesia Preprocedure Evaluation (Addendum)
Anesthesia Evaluation  Patient identified by MRN, date of birth, ID band Patient awake    Reviewed: Allergy & Precautions, H&P , NPO status , Patient's Chart, lab work & pertinent test results  Airway Mallampati: II TM Distance: >3 FB Neck ROM: full    Dental  (+) Edentulous Upper and Edentulous Lower   Pulmonary neg pulmonary ROS, shortness of breath, asthma , COPD COPD inhaler,  breath sounds clear to auscultation  Pulmonary exam normal       Cardiovascular Exercise Tolerance: Good negative cardio ROS  + dysrhythmias + Valvular Problems/Murmurs Rhythm:regular Rate:Normal + Systolic murmurs    Neuro/Psych Anxiety Depression CVA, No Residual Symptoms negative neurological ROS  negative psych ROS   GI/Hepatic negative GI ROS, Neg liver ROS, hiatal hernia, GERD-  Medicated and Controlled,  Endo/Other  negative endocrine ROSWell Controlled, Type 2  Renal/GU negative Renal ROS  negative genitourinary   Musculoskeletal   Abdominal   Peds  Hematology negative hematology ROS (+)   Anesthesia Other Findings   Reproductive/Obstetrics negative OB ROS                          Anesthesia Physical Anesthesia Plan  ASA: III  Anesthesia Plan: General   Post-op Pain Management:    Induction: Intravenous  Airway Management Planned: LMA  Additional Equipment:   Intra-op Plan:   Post-operative Plan:   Informed Consent: I have reviewed the patients History and Physical, chart, labs and discussed the procedure including the risks, benefits and alternatives for the proposed anesthesia with the patient or authorized representative who has indicated his/her understanding and acceptance.   Dental Advisory Given  Plan Discussed with: Surgeon  Anesthesia Plan Comments:         Anesthesia Quick Evaluation

## 2012-02-28 NOTE — Interval H&P Note (Signed)
History and Physical Interval Note:  02/28/2012 1:00 PM  [name] [name]  has presented today for surgery, with the diagnosis of Left Knee Medial meniscal tear  The various methods of treatment have been discussed with the patient and family. After consideration of risks, benefits and other options for treatment, the patient has consented to  Procedure(s) (LRB): ARTHROSCOPY KNEE (Left) as a surgical intervention .  The patient's history has been reviewed, patient examined, no change in status, stable for surgery.  I have reviewed the patient's chart and labs.  Questions were answered to the patient's satisfaction.     Niko Penson A

## 2012-02-28 NOTE — Anesthesia Postprocedure Evaluation (Signed)
  Anesthesia Post-op Note  Patient: [name] [name]  Procedure(s) Performed: Procedure(s) (LRB): ARTHROSCOPY KNEE (Left)  Patient Location: PACU  Anesthesia Type: General  Level of Consciousness: awake and alert   Airway and Oxygen Therapy: Patient Spontanous Breathing  Post-op Pain: mild  Post-op Assessment: Post-op Vital signs reviewed, Patient's Cardiovascular Status Stable, Respiratory Function Stable, Patent Airway and No signs of Nausea or vomiting  Post-op Vital Signs: stable  Complications: No apparent anesthesia complications

## 2012-02-28 NOTE — H&P (View-Only) (Signed)
EKG 02/06/12 chart

## 2012-02-28 NOTE — Transfer of Care (Signed)
Immediate Anesthesia Transfer of Care Note  Patient: [name] [name]  Procedure(s) Performed: Procedure(s) (LRB): ARTHROSCOPY KNEE (Left)  Patient Location: PACU  Anesthesia Type: General  Level of Consciousness: awake, alert , oriented and patient cooperative  Airway & Oxygen Therapy: Patient Spontanous Breathing and Patient connected to face mask oxygen  Post-op Assessment: Report given to PACU RN  Post vital signs: Reviewed and stable  Complications: No apparent anesthesia complications

## 2012-02-29 ENCOUNTER — Encounter (HOSPITAL_COMMUNITY): Payer: Self-pay | Admitting: Orthopedic Surgery

## 2012-02-29 NOTE — Op Note (Signed)
[name] [name], [name] [name] NO.:  [id number]  MEDICAL RECORD NO.:  [id number]  LOCATION:  [facility]                         FACILITY:  [facility]  PHYSICIAN:  Georges Lynch. Christyann Manolis, M.D.DATE OF BIRTH:  [birth year]  DATE OF PROCEDURE:  02/28/2012 DATE OF DISCHARGE:  02/28/2012                              OPERATIVE REPORT   SURGEON:  Windy Fast A. Maleah Rabago, MD  ASSISTANT:  The nurse.  PREOPERATIVE DIAGNOSES: 1. Degenerative arthritis, left knee. 2. Torn medial meniscus, left knee.  POSTOPERATIVE DIAGNOSES: 1. Degenerative arthritis, left knee. 2. Torn medial meniscus, left knee.  OPERATION: 1. Diagnostic arthroscopy, left knee. 2. Abrasion chondroplasty of the patellofemoral joint, left knee. 3. Synovectomy, left knee; suprapatellar pouch. 4. Medial meniscectomy of a complex tear of the posterior horn, medial     meniscus, left knee. 5. Abrasion chondroplasty of the medial femoral condyle. 6. Microfracture technique medial femoral condyle.  PROCEDURE IN DETAIL:  Under general anesthesia, routine orthopedic prep and drape of the left lower extremity was carried out.  She had Cleocin 600 mg IV.  At this time, the appropriate time-out was carried out in the operating room.  Prior to surgery, I also marked the appropriate left leg in the holding area.  Small punctate incision was made in suprapatellar pouch.  Knee was distended with saline.  Small punctate incision was made in the anterolateral joint.  The arthroscope was entered from lateral approach and a complete diagnostic arthroscopy was carried out.  I went up in the suprapatellar pouch, she had severe chronic thickened synovium.  I introduced the ArthroCare and did a synovectomy and then inspected the patellofemoral joint.  She had severe patellofemoral arthritis.  I did an abrasion chondroplasty of the patellofemoral joint.  Following that, I went down to lateral joint and lateral joint showed very minimal changes nothing  really needed to be done there.  Cruciate's were intact.  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  I did an abrasion chondroplasty of the medial femoral condyle and then microfracture technique on the medial femoral condyle.  I also did a medial meniscectomy of a severe complex tear of the posterior horn medial meniscus.  I thoroughly irrigated out the knee, removed all the fluid, closed all 3 punctate incisions with 3-0 nylon suture.  I injected 30 mL of 0.25% Marcaine and epinephrine in the joint and a sterile Neosporin dressing was applied.  FOLLOWUP CARE: 1. She will be on aspirin 325 mg b.i.d. as an anticoagulant. 2. She can take hydrocodone she told me they will put her on     hydrocodone 10/650 mg one every 4 hours p.r.n. for pain.  She will     be on crutches, partial weightbearing as tolerated. 3. We will see her in the office 10-12 days or prior to if there is a     problem.          ______________________________ Georges Lynch Darrelyn Hillock, M.D.     RAG/MEDQ  D:  02/28/2012  T:  02/29/2012  Job:  [id number]

## 2012-07-24 ENCOUNTER — Encounter: Payer: Self-pay | Admitting: Internal Medicine

## 2012-07-24 ENCOUNTER — Ambulatory Visit (INDEPENDENT_AMBULATORY_CARE_PROVIDER_SITE_OTHER): Payer: Medicaid Other | Admitting: Internal Medicine

## 2012-07-24 VITALS — BP 136/78 | HR 59 | Ht 61.0 in | Wt 173.4 lb

## 2012-07-24 DIAGNOSIS — J42 Unspecified chronic bronchitis: Secondary | ICD-10-CM

## 2012-07-24 DIAGNOSIS — J309 Allergic rhinitis, unspecified: Secondary | ICD-10-CM

## 2012-07-24 DIAGNOSIS — R05 Cough: Secondary | ICD-10-CM

## 2012-07-24 DIAGNOSIS — J3089 Other allergic rhinitis: Secondary | ICD-10-CM

## 2012-07-24 DIAGNOSIS — L509 Urticaria, unspecified: Secondary | ICD-10-CM

## 2012-07-24 DIAGNOSIS — J209 Acute bronchitis, unspecified: Secondary | ICD-10-CM

## 2012-07-24 DIAGNOSIS — J302 Other seasonal allergic rhinitis: Secondary | ICD-10-CM

## 2012-07-24 MED ORDER — DOXYCYCLINE HYCLATE 100 MG PO TABS: ORAL_TABLET | ORAL | Status: DC

## 2012-07-24 MED ORDER — LEVALBUTEROL HCL 0.63 MG/3ML IN NEBU
0.6300 mg | INHALATION_SOLUTION | Freq: Once | RESPIRATORY_TRACT | Status: AC
  Administered 2012-07-24: 0.63 mg via RESPIRATORY_TRACT

## 2012-07-24 MED ORDER — DYMISTA 137-50 MCG/ACT NA SUSP: 2.0000 | Freq: Every day | NASAL | Status: DC

## 2012-07-24 MED ORDER — BENZONATATE 200 MG PO CAPS: 200.0000 mg | ORAL_CAPSULE | Freq: Three times a day (TID) | ORAL | Status: DC | PRN

## 2012-07-24 NOTE — Progress Notes (Signed)
07/24/12- 38 yoF former smoker (06/24/12), former patient at old office,  Ref By Dr. Myers/ [facility] -- pt reports having prod cough w thick green mucus, pnd, chest tightness x4 weeks occuring mostly w weather change She reports cough and chest congestion off and on for years. Says she just stopped smoking in November of 2013. Now green sputum for 2 or 3 weeks with scratchy throat but no fever, blood or pain. Some cough even on good days. Occasional wheeze and she uses her rescue inhaler between 3 and 6 times daily. History of an allergic component and was on allergy vaccine years ago. Reports urticaria last week after steroid injection in her knee and shoulder, and also had flu vaccine about the same time. She treated herself with Benadryl successfully. She remains on Benadryl 50 mg twice daily. Has had hives before and "nervous". Several episodes of pneumonia. Aware of GERD symptoms despite Dexilant. CXR- 02/22/12 image reviewed IMPRESSION:  Negative exam.  Original Report Authenticated By: Rosealee Albee, M.D.   // Will want CXR on return//    Prior to Admission medications   Medication Sig Start Date End Date Taking? Authorizing Provider  albuterol (PROVENTIL HFA;VENTOLIN HFA) 108 (90 BASE) MCG/ACT inhaler Take two puffs four times a day as needed   Yes Historical Provider, MD  ALPRAZolam (XANAX) 0.25 MG tablet Take 0.25 mg by mouth 3 (three) times daily as needed. For anxiety   Yes Historical Provider, MD  atorvastatin (LIPITOR) 40 MG tablet Take 40 mg by mouth at bedtime.  02/06/12  Yes Vesta Mixer, MD  calcium carbonate (OS-CAL) 600 MG TABS Take 600 mg by mouth daily.    Yes Historical Provider, MD  Cholecalciferol (VITAMIN D) 1000 UNITS capsule Take 1,000 Units by mouth every morning.    Yes Historical Provider, MD  dexlansoprazole (DEXILANT) 60 MG capsule Take 60 mg by mouth every morning. 11/16/10  Yes Mardella Layman, MD  fluticasone [facility]) 50 MCG/ACT nasal spray Place 2 sprays  into the nose daily.   Yes Historical Provider, MD  Fluticasone-Salmeterol (ADVAIR DISKUS) 500-50 MCG/DOSE AEPB Take 1 puff by mouth 2 (two) times daily as needed. For shortness of breath   Yes Historical Provider, MD  furosemide (LASIX) 40 MG tablet Take 40 mg by mouth every morning. 02/06/12  Yes Vesta Mixer, MD  metFORMIN (GLUCOPHAGE) 500 MG tablet Take 500 mg by mouth 2 (two) times daily with a meal.   Yes Historical Provider, MD  Olopatadine HCl (PATADAY) 0.2 % SOLN Apply 1 drop to eye daily.   Yes Historical Provider, MD  Omega-3 Fatty Acids (FISH OIL) 1000 MG CAPS Take 3 capsules by mouth at bedtime.    Yes Historical Provider, MD  rosuvastatin (CRESTOR) 20 MG tablet Take 20 mg by mouth every morning. 02/06/12  Yes Vesta Mixer, MD  sertraline (ZOLOFT) 50 MG tablet Take 50 mg by mouth at bedtime.    Yes Historical Provider, MD  vitamin B-12 (CYANOCOBALAMIN) 1000 MCG tablet Take 1,000 mcg by mouth daily.    Yes Historical Provider, MD  zolpidem (AMBIEN) 10 MG tablet Take 10 mg by mouth at bedtime as needed. For sleep   Yes Historical Provider, MD  benzonatate (TESSALON) 200 MG capsule Take 1 capsule (200 mg total) by mouth 3 (three) times daily as needed for cough. 07/24/12 07/24/13  Waymon Budge, MD  doxycycline (VIBRA-TABS) 100 MG tablet 2 today then one daily 07/24/12 07/24/13  Waymon Budge, MD  DYMISTA [phone]  MCG/ACT SUSP Place 2 puffs into the nose at bedtime. 07/24/12   Waymon Budge, MD  sucralfate (CARAFATE) 1 GM/10ML suspension Take 10 mLs (1 g total) by mouth 4 (four) times daily. 10/21/10 10/21/11  Mardella Layman, MD   Past Medical History  Diagnosis Date  . Insomnia, unspecified   . Esophageal reflux   . Hyperlipemia   . Asthma   . Osteoporosis   . Arthritis   . COPD (chronic obstructive pulmonary disease)   . Depression   . Personal history of colonic polyps 09/2010    TUBULAR ADENOMAS (X3); NEGATIVE FOR HIGH GRADE DYSPLASIA OR MALIGNANCY.  . Barrett esophagus    . Hiatal hernia   . Esophageal stricture   . GERD (gastroesophageal reflux disease)   . GAD (generalized anxiety disorder)   . Aortic valve disorders   . Stroke     hx of 2   . Dysrhythmia     heart skips per pt   . Heart murmur   . Anxiety   . Shortness of breath     with exertion   . Pneumonia     hx of   . Type II or unspecified type diabetes mellitus without mention of complication, not stated as uncontrolled     on no meds   . Chronic kidney disease     xh of   . Anemia     hx of years ago    Past Surgical History  Procedure Date  . Cholecystectomy 2005  . Partial hysterectomy   . Shoulder surgery 2011  . Tubal ligation   . Tonsillectomy   . Foot surgery 2005    Left foot  . Wrist flexion tendon tenotomies and proximal corpectomy w/ wrist arthrodesis&iliac crest bone graft   . Cardiac catheterization     normal per Dr Elease Hashimoto in note dated 02/06/12   . Knee arthroscopy 02/28/2012    Procedure: ARTHROSCOPY KNEE;  Surgeon: Jacki Cones, MD;  Location: [facility];  Service: Orthopedics;  Laterality: Left;   Family History  Problem Relation Age of Onset  . Breast cancer Mother   . Diabetes Maternal Grandfather   . Kidney disease      Both sides of family  . Heart disease      Both sides of family  . Colon cancer Brother 62  . Colon polyps Brother   . Breast cancer Maternal Aunt   . Breast cancer Maternal Grandmother    History   Social History  . Marital Status: Divorced    Spouse Name: N/A    Number of Children: N/A  . Years of Education: N/A   Occupational History  . Retired    Social History Main Topics  . Smoking status: Former Smoker -- 0.2 packs/day for 40 years    Types: Cigarettes    Quit date: 06/24/2012  . Smokeless tobacco: Former Neurosurgeon    Types: Snuff, Chew    Quit date: 12/31/2011  . Alcohol Use: 0.6 oz/week    1 Cans of beer per week     Comment: occasionally  . Drug Use: No  . Sexually Active: Not on file   Other Topics Concern   . Not on file   Social History Narrative  . No narrative on file   ROS-see HPI Constitutional:   No-   weight loss, night sweats, fevers, chills, fatigue, lassitude. HEENT:   No-  headaches, difficulty swallowing, tooth/dental problems, sore throat,  No-  Sneezing,+ itching, no-ear ache, +nasal congestion, post nasal drip,  CV:  No-   chest pain, orthopnea, PND, swelling in lower extremities, anasarca, dizziness, +palpitations Resp: +  shortness of breath with exertion or at rest.              No-   productive cough,  No non-productive cough,  No- coughing up of blood.              No-   change in color of mucus.  No- wheezing.   Skin: + rash or lesions. GI:  +  heartburn, indigestion, no-abdominal pain, nausea, vomiting, diarrhea,                 change in bowel habits, loss of appetite GU: No-   dysuria, change in color of urine, no urgency or frequency.  No- flank pain. MS:  + joint pain or swelling.  No-No- decreased range of motion.  No- back pain. Neuro-     nothing unusual Psych:  No- change in mood or affect. + depression or anxiety.  No memory loss.  OBJ- Physical Exam General- Alert, Oriented, Affect-appropriate, Distress- none acute. Overweight Skin- + faint area of hives demonstrated on arm Lymphadenopathy- none Head- atraumatic            Eyes- Gross vision intact, PERRLA, conjunctivae and secretions clear            Ears- Hearing, canals-normal            Nose- Clear, no-Septal dev, mucus, polyps, erosion, perforation             Throat- Mallampati II , mucosa clear , drainage- none, tonsils- atrophic Neck- flexible , trachea midline, no stridor , thyroid nl, carotid no bruit Chest - symmetrical excursion , unlabored           Heart/CV- RRR , no murmur , no gallop  , no rub, nl s1 s2                           - JVD- none , edema- none, stasis changes- none, varices- none           Lung- clear to P&A, +wheezey cough with deep breath , dullness-none, rub- none            Chest wall-  Abd- tender-no, distended-no, bowel sounds-present, HSM- no Br/ Gen/ Rectal- Not done, not indicated Extrem- cyanosis- none, clubbing, none, atrophy- none, strength- nl Neuro- grossly intact to observation

## 2012-07-24 NOTE — Patient Instructions (Addendum)
Script sent for antibiotic and for the benzonatate perles for cough  Neb xop 0.63  Sample Dymista nasal spray    1-2 puffs each nostril once daily at bedtime  For the hives, try allegra/ fexofenadine or zyrtec/ cetirizine. These are once daily and non-sedating.   CXR on return visit

## 2012-07-24 NOTE — Progress Notes (Deleted)
  Subjective:    Patient ID: [name] [name], female    DOB: 1951/02/11, 61 y.o.   MRN: [id number]  HPI    Review of Systems  Constitutional: Negative for fever and unexpected weight change.  HENT: Positive for postnasal drip. Negative for ear pain, nosebleeds, congestion, sore throat, rhinorrhea, sneezing, trouble swallowing, dental problem and sinus pressure.   Eyes: Negative for redness and itching.  Respiratory: Positive for cough, chest tightness and shortness of breath. Negative for wheezing.   Cardiovascular: Positive for palpitations and leg swelling.  Gastrointestinal: Negative for nausea and vomiting.  Genitourinary: Negative for dysuria.  Musculoskeletal: Negative for joint swelling.  Skin: Positive for rash.  Neurological: Negative for headaches.  Hematological: Does not bruise/bleed easily.  Psychiatric/Behavioral: Negative for dysphoric mood. The patient is nervous/anxious.        Objective:   Physical Exam        Assessment & Plan:

## 2012-08-05 DIAGNOSIS — J449 Chronic obstructive pulmonary disease, unspecified: Secondary | ICD-10-CM | POA: Insufficient documentation

## 2012-08-05 DIAGNOSIS — L509 Urticaria, unspecified: Secondary | ICD-10-CM | POA: Insufficient documentation

## 2012-08-05 DIAGNOSIS — J302 Other seasonal allergic rhinitis: Secondary | ICD-10-CM | POA: Insufficient documentation

## 2012-08-05 NOTE — Assessment & Plan Note (Signed)
Nonspecific. She blames "stress" since her husband left her for a younger woman. She has also been dealing with an acute infection which could be a trigger. We discussed medications.  Plan- start by switching from Benadryl to a nonsedating antihistamine like Allegra. which can then be tapered as tolerated. We will look harder if we find that hives are persisting

## 2012-08-05 NOTE — Assessment & Plan Note (Signed)
Plan- Sample dymista nasal spray

## 2012-08-05 NOTE — Assessment & Plan Note (Signed)
Long tobacco history. We will want radiologic followup. We will proceed first with medical management. Plan-doxycycline, nebulizer with Xopenex

## 2012-08-22 ENCOUNTER — Telehealth: Payer: Self-pay | Admitting: Internal Medicine

## 2012-08-22 DIAGNOSIS — J42 Unspecified chronic bronchitis: Secondary | ICD-10-CM

## 2012-08-22 NOTE — Telephone Encounter (Signed)
Spoke with patient in the lobby.  Pt c/o wheezing, prod cough with small amounts of yellow/green sputum, increased SOB w/ some pressure/tightness in chest x10days, worse since last evening.  Denies f/c/s.  Pt has upcoming ov 2.4.14. Call pt back on her cell phone @ [phone]. [facility] Allergies  Allergen Reactions  . Boniva (Ibandronate Sodium)     Jaw popping  . Ceclor (Cefaclor) Other (See Comments)    Reaction=burning all over  . Codeine Nausea And Vomiting  . Penicillins    Dr Maple Hudson please advise, thanks.

## 2012-08-22 NOTE — Telephone Encounter (Signed)
Per CY- Order DME nebulizer compressor  Albuterol 0.083% Neb sol'n #50 1 QID PRN Refill PRN Dx Chronic Bronchitis

## 2012-08-22 NOTE — Telephone Encounter (Signed)
LMTCB

## 2012-08-23 MED ORDER — ALBUTEROL SULFATE (2.5 MG/3ML) 0.083% IN NEBU: 2.5000 mg | INHALATION_SOLUTION | Freq: Four times a day (QID) | RESPIRATORY_TRACT | Status: DC | PRN

## 2012-08-23 NOTE — Telephone Encounter (Signed)
Pt notified of CY recs and an order has been sent to [facility] for neb machine and RX to the pharmacy for the albuterol neb solution. Pt verbalized understanding and has no questions.

## 2012-08-29 ENCOUNTER — Telehealth: Payer: Self-pay | Admitting: Internal Medicine

## 2012-08-29 DIAGNOSIS — J209 Acute bronchitis, unspecified: Secondary | ICD-10-CM

## 2012-08-29 NOTE — Telephone Encounter (Signed)
Melissa called back, she does not see the 1.23.14 order.  But she will take the order from today and process this.  Nothing further needed; will sign off.

## 2012-08-29 NOTE — Telephone Encounter (Signed)
Order was sent to [facility] on 1.23.14, but the way in which these are placed have changed recently > ? Could be why she has not received this yet ?  LMOM TCB x1 for [name] [name], our [facility] liaison. In the meantime, will resend order the "old" way with a note that pt needs this today if possible. Called spoke with patient, informed her of the above and apologized for the inconvenience.  Pt okay with this and verbalized her understanding.  Advised that if she does not receive her neb machine or hear from [facility] by lunch-time tomorrow to call us back. =========================== [name] [name] called back.  She will check on the previous order, is aware that a new one has been placed and that the patient needs her machine today or tomorrow.  She will check on this and call me back directly.  Will forward to by inbasket.

## 2012-09-04 ENCOUNTER — Ambulatory Visit (INDEPENDENT_AMBULATORY_CARE_PROVIDER_SITE_OTHER)
Admission: RE | Admit: 2012-09-04 | Discharge: 2012-09-04 | Disposition: A | Discharge status: Home / Self Care | Payer: Medicaid Other | Source: Ambulatory Visit | Attending: Internal Medicine | Admitting: Internal Medicine

## 2012-09-04 ENCOUNTER — Ambulatory Visit (INDEPENDENT_AMBULATORY_CARE_PROVIDER_SITE_OTHER): Payer: Medicaid Other | Admitting: Internal Medicine

## 2012-09-04 VITALS — BP 130/70 | HR 61 | Ht 61.0 in | Wt 179.2 lb

## 2012-09-04 DIAGNOSIS — J42 Unspecified chronic bronchitis: Secondary | ICD-10-CM

## 2012-09-04 DIAGNOSIS — J209 Acute bronchitis, unspecified: Secondary | ICD-10-CM

## 2012-09-04 DIAGNOSIS — L509 Urticaria, unspecified: Secondary | ICD-10-CM

## 2012-09-04 MED ORDER — BENZONATATE 200 MG PO CAPS: 200.0000 mg | ORAL_CAPSULE | Freq: Three times a day (TID) | ORAL | Status: DC | PRN

## 2012-09-04 MED ORDER — MOMETASONE FURO-FORMOTEROL FUM 100-5 MCG/ACT IN AERO: INHALATION_SPRAY | RESPIRATORY_TRACT | Status: DC

## 2012-09-04 MED ORDER — LEVALBUTEROL HCL 0.63 MG/3ML IN NEBU
0.6300 mg | INHALATION_SOLUTION | Freq: Once | RESPIRATORY_TRACT | Status: AC
  Administered 2012-09-04: 0.63 mg via RESPIRATORY_TRACT

## 2012-09-04 MED ORDER — METHYLPREDNISOLONE ACETATE 80 MG/ML IJ SUSP
80.0000 mg | Freq: Once | INTRAMUSCULAR | Status: AC
  Administered 2012-09-04: 80 mg via INTRAMUSCULAR

## 2012-09-04 MED ORDER — ALBUTEROL SULFATE HFA 108 (90 BASE) MCG/ACT IN AERS: 2.0000 | INHALATION_SPRAY | RESPIRATORY_TRACT | Status: DC | PRN

## 2012-09-04 NOTE — Patient Instructions (Addendum)
Neb xop 0.63  Depo 80  Sample and script for Dulera 100 maintenance controller      2 puffs then rinse mouth, twice daily    Try this instead of Advair  Script for refill albuterol HFA rescue inhaler  Script for tessalon perles for cough   - amount limited by Medicaid  Schedule PFT   Dx asthma with bronchitis

## 2012-09-04 NOTE — Progress Notes (Signed)
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 Myers/ [facility] -- pt reports having prod cough w thick green mucus, pnd, chest tightness x4 weeks occuring mostly w weather change She reports cough and chest congestion off and on for years. Says she just stopped smoking in November of 2013. Now green sputum for 2 or 3 weeks with scratchy throat but no fever, blood or pain. Some cough even on good days. Occasional wheeze and she uses her rescue inhaler between 3 and 6 times daily. History of an allergic component and was on allergy vaccine years ago. Reports urticaria last week after steroid injection in her knee and shoulder, and also had flu vaccine about the same time. She treated herself with Benadryl successfully. She remains on Benadryl 50 mg twice daily. Has had hives before and "nervous". Several episodes of pneumonia. Aware of GERD symptoms despite Dexilant. CXR- 02/22/12 image reviewed IMPRESSION:  Negative exam.  Original Report Authenticated By: Rosealee Albee, M.D.   09/04/12- 88 yoF former smoker (06/24/12), former patient at old office,  Follows For: 4 week f/u - Non prod cough - Sinus and chest congestion - SOB worse with weather changes - Chest tightness and wheezing - Out of Advair for 2 weeks Advair sample helped. Dry cough. Postnasal drip with yellow nasal discharge. Hives resolved. Nebulizer does help. CXR 09/04/12 IMPRESSION:  No acute disease.  Original Report Authenticated By: Holley Dexter, M.D.  ROS-see HPI Constitutional:   No-   weight loss, night sweats, fevers, chills, fatigue, lassitude. HEENT:   No-  headaches, difficulty swallowing, tooth/dental problems, sore throat,       No-  Sneezing, itching, no-ear ache, +nasal congestion, post nasal drip,  CV:  No-   chest pain, orthopnea, PND, swelling in lower extremities, anasarca, dizziness, +palpitations Resp: +  shortness of breath with exertion or at rest.              No-    productive cough,  No non-productive cough,  No- coughing up of blood.              No-   change in color of mucus.  No- wheezing.   Skin: + rash or lesions. GI:  +  heartburn, indigestion, no-abdominal pain, nausea, vomiting,  GU: . MS:  + joint pain or swelling.   Neuro-     nothing unusual Psych:  No- change in mood or affect. + depression or anxiety.  No memory loss.  OBJ- Physical Exam General- Alert, Oriented, Affect-appropriate, Distress- none acute. Overweight Skin- + faint area of hives demonstrated on arm Lymphadenopathy- none Head- atraumatic            Eyes- Gross vision intact, PERRLA, conjunctivae and secretions clear            Ears- Hearing, canals-normal            Nose- Clear, no-Septal dev, mucus, polyps, erosion, perforation             Throat- Mallampati II , mucosa clear , drainage- none, tonsils- atrophic Neck- flexible , trachea midline, no stridor , thyroid nl, carotid no bruit Chest - symmetrical excursion , unlabored           Heart/CV- RRR , no murmur , no gallop  , no rub, nl s1 s2                           -  JVD- none , edema- none, stasis changes- none, varices- none           Lung- +diminished, +light wheeze, +harsh cough with deep breath , dullness-none, rub- none           Chest wall-  Abd-  Br/ Gen/ Rectal- Not done, not indicated Extrem- cyanosis- none, clubbing, none, atrophy- none, strength- nl Neuro- grossly intact to observation

## 2012-09-13 ENCOUNTER — Encounter: Payer: Self-pay | Admitting: Internal Medicine

## 2012-09-13 NOTE — Assessment & Plan Note (Signed)
Medications should provide control. Needs refills. We discussed options. Because of price we will switch from Advair to [facility]. Plan-Tessalon, Dulera, pro air

## 2012-09-13 NOTE — Assessment & Plan Note (Signed)
Resolved, probably self-limited. Etiology not defined.

## 2012-09-18 ENCOUNTER — Ambulatory Visit: Payer: Medicaid Other | Attending: Orthopedic Surgery | Admitting: Physical Therapy

## 2012-09-18 DIAGNOSIS — M25519 Pain in unspecified shoulder: Secondary | ICD-10-CM | POA: Insufficient documentation

## 2012-09-18 DIAGNOSIS — IMO0001 Reserved for inherently not codable concepts without codable children: Secondary | ICD-10-CM | POA: Insufficient documentation

## 2012-09-18 DIAGNOSIS — R5381 Other malaise: Secondary | ICD-10-CM | POA: Insufficient documentation

## 2012-09-18 DIAGNOSIS — M25619 Stiffness of unspecified shoulder, not elsewhere classified: Secondary | ICD-10-CM | POA: Insufficient documentation

## 2012-09-19 ENCOUNTER — Telehealth: Payer: Self-pay | Admitting: Internal Medicine

## 2012-09-19 NOTE — Telephone Encounter (Signed)
Medicaid ID# [id number] L.  [name] is non-preferred. Preferred alternatives are:  Qvar, Advair, Symbicort and Pulmicort.  Pt must try 2 alternatives and she has only tried Advair. DX of acute bronchitis MAY be enough for approval.  PA initiated and under clinical review. Should receive a response within 24 hours. Ref # [id number] P.  Will forward to Katie to await decision and for follow-up.

## 2012-09-20 ENCOUNTER — Ambulatory Visit: Payer: Medicaid Other | Admitting: Physical Therapy

## 2012-09-26 ENCOUNTER — Ambulatory Visit: Payer: Medicaid Other | Admitting: Physical Therapy

## 2012-09-28 NOTE — Telephone Encounter (Signed)
Called to check on PA for [facility].  Dulera APPROVED from 09/19/2012 through 09/09/2014 Ref # [id number] Pharmacy notified.

## 2012-10-08 ENCOUNTER — Ambulatory Visit: Payer: Medicaid Other | Admitting: Internal Medicine

## 2012-10-09 ENCOUNTER — Telehealth: Payer: Self-pay | Admitting: Internal Medicine

## 2012-10-09 NOTE — Telephone Encounter (Signed)
ATC patient, no answer LMOMTCB 

## 2012-10-10 MED ORDER — BENZONATATE 200 MG PO CAPS: 200.0000 mg | ORAL_CAPSULE | Freq: Three times a day (TID) | ORAL | Status: DC | PRN

## 2012-10-10 NOTE — Telephone Encounter (Signed)
LMTCBx1.Jennifer Castillo, CMA  

## 2012-10-10 NOTE — Telephone Encounter (Signed)
Per CY-okay to refill as requested. 

## 2012-10-10 NOTE — Telephone Encounter (Signed)
Tessalon refilled 09/04/12 #20 x PRN refills. Per pt [facility] can not transfer this to her new pharmacy [facility] and we need to send in RX. Please advise if okay to send in RX Dr. Maple Hudson thanks Last OV 09/04/12 Pending PFT AND OV 11/05/12

## 2012-10-10 NOTE — Telephone Encounter (Signed)
Rx has been sent to the pharmacy. Pt is aware. 

## 2012-10-11 ENCOUNTER — Encounter: Payer: Self-pay | Admitting: *Deleted

## 2012-10-16 ENCOUNTER — Encounter: Payer: Medicaid Other | Admitting: Gastroenterology

## 2012-10-16 NOTE — Progress Notes (Signed)
This encounter was created in error - please disregard.

## 2012-11-05 ENCOUNTER — Ambulatory Visit (INDEPENDENT_AMBULATORY_CARE_PROVIDER_SITE_OTHER): Payer: Medicaid Other | Admitting: Internal Medicine

## 2012-11-05 ENCOUNTER — Encounter: Payer: Self-pay | Admitting: Internal Medicine

## 2012-11-05 VITALS — BP 124/70 | HR 74 | Ht 60.0 in | Wt 172.0 lb

## 2012-11-05 DIAGNOSIS — J42 Unspecified chronic bronchitis: Secondary | ICD-10-CM

## 2012-11-05 DIAGNOSIS — J209 Acute bronchitis, unspecified: Secondary | ICD-10-CM

## 2012-11-05 DIAGNOSIS — L509 Urticaria, unspecified: Secondary | ICD-10-CM

## 2012-11-05 LAB — PULMONARY FUNCTION TEST

## 2012-11-05 MED ORDER — MOMETASONE FURO-FORMOTEROL FUM 100-5 MCG/ACT IN AERO: INHALATION_SPRAY | RESPIRATORY_TRACT | Status: DC

## 2012-11-05 MED ORDER — MOMETASONE FURO-FORMOTEROL FUM 100-5 MCG/ACT IN AERO: 2.0000 | INHALATION_SPRAY | Freq: Two times a day (BID) | RESPIRATORY_TRACT | Status: DC

## 2012-11-05 NOTE — Progress Notes (Signed)
07/24/12- 49 yoF former smoker (06/24/12), former patient at old office,  Ref By Dr. Myers/ [facility] -- pt reports having prod cough w thick green mucus, pnd, chest tightness x4 weeks occuring mostly w weather change She reports cough and chest congestion off and on for years. Says she just stopped smoking in November of 2013. Now green sputum for 2 or 3 weeks with scratchy throat but no fever, blood or pain. Some cough even on good days. Occasional wheeze and she uses her rescue inhaler between 3 and 6 times daily. History of an allergic component and was on allergy vaccine years ago. Reports urticaria last week after steroid injection in her knee and shoulder, and also had flu vaccine about the same time. She treated herself with Benadryl successfully. She remains on Benadryl 50 mg twice daily. Has had hives before and "nervous". Several episodes of pneumonia. Aware of GERD symptoms despite Dexilant. CXR- 02/22/12 image reviewed IMPRESSION:  Negative exam.  Original Report Authenticated By: Rosealee Albee, M.D.   09/04/12- 40 yoF former smoker (06/24/12), former patient at old office,  Follows For: 4 week f/u - Non prod cough - Sinus and chest congestion - SOB worse with weather changes - Chest tightness and wheezing - Out of Advair for 2 weeks Advair sample helped. Dry cough. Postnasal drip with yellow nasal discharge. Hives resolved. Nebulizer does help. CXR 09/04/12 IMPRESSION:  No acute disease.  Original Report Authenticated By: Holley Dexter, M.D.  11/05/12- 70 yoF former smoker (06/24/12), former patient at old office, followed for chronic bronchitis. FOLLOWS FOR: PFTs today-- pt reports breathing is "slightly worse" d/t weather, gets SOB easier, prod cough at times-- denies any other concerns at this time [facility] sample worked well instead of advair. LOV nebulizer and depomedrol helped " a bunch", as did Tessalon for cough. Still smoking 4-5 cigarettes/ day and resists blaming this for any  airway problems. Blames weather. Also blames oil heat and lack of airconditioning in her home. Asks letter stating those are aggravating her breathing. Says she looks for opportunities to be out of house. CXR 09/04/12 IMPRESSION:  No acute disease.  Original Report Authenticated By: Holley Dexter, M.D. PFT- 11/05/12- Normal except mild reduction of DLCO. No response to bronchodilator. FVC 2.17/ 83%, FEV1 1.76/ 91%, FEV1/FVC 0.81, TLC 93%, DLCO 68%.  ROS-see HPI Constitutional:   No-   weight loss, night sweats, fevers, chills, fatigue, lassitude. HEENT:   No-  headaches, difficulty swallowing, tooth/dental problems, sore throat,       No-  Sneezing, itching, no-ear ache, nasal congestion, post nasal drip,  CV:  No-   chest pain, orthopnea, PND, swelling in lower extremities, anasarca, dizziness, +palpitations Resp: +  shortness of breath with exertion or at rest.              No-   productive cough,  + non-productive cough,  No- coughing up of blood.              No-   change in color of mucus.  + wheezing.   Skin: No- rash or lesions. GI:  +  heartburn, indigestion, no-abdominal pain, nausea, vomiting,  GU: . MS:  + joint pain or swelling.   Neuro-     nothing unusual Psych:  No- change in mood or affect. + depression or anxiety.  No memory loss.  OBJ- Physical Exam General- Alert, Oriented, Affect-appropriate, Distress- none acute. Overweight Skin- No rash Lymphadenopathy- none Head- atraumatic  Eyes- Gross vision intact, PERRLA, conjunctivae and secretions clear            Ears- Hearing, canals-normal            Nose- Clear, no-Septal dev, mucus, polyps, erosion, perforation             Throat- Mallampati II , mucosa clear , drainage- none, tonsils- atrophic Neck- flexible , trachea midline, no stridor , thyroid nl, carotid no bruit Chest - symmetrical excursion , unlabored           Heart/CV- RRR , no murmur , no gallop  , no rub, nl s1 s2                           -  JVD- none , edema- none, stasis changes- none, varices- none           Lung-  +harsh cough with deep breath again noted , dullness-none, rub- none           Chest wall-  Abd-  Br/ Gen/ Rectal- Not done, not indicated Extrem- cyanosis- none, clubbing, none, atrophy- none, strength- nl Neuro- grossly intact to observation

## 2012-11-05 NOTE — Assessment & Plan Note (Signed)
Raspy cough despite good PFTs. There is a bronchitis. Watch for reflux. Plan- emphasis on smoking cessation. Walk for endurance and weight loss.           She requests letter supporting need for Heat Pump to replace old oil heat, and air conditioning. She hopes her breathing can improve.           Emphasized total smoking cessation.

## 2012-11-05 NOTE — Patient Instructions (Addendum)
Script with sample and coupon for [facility] 100 maintenance controller inhaler     2 puffs then rinse mouth well, twice daily every day.  Please keep trying to stop smoking completely  Please call as needed

## 2012-11-05 NOTE — Progress Notes (Signed)
PFT done today. 

## 2012-11-05 NOTE — Assessment & Plan Note (Signed)
Currently in remission 

## 2012-11-20 ENCOUNTER — Telehealth: Payer: Self-pay | Admitting: Internal Medicine

## 2012-11-20 NOTE — Telephone Encounter (Signed)
Chronic bronchitis with acute exacerbation - Waymon Budge, MD at 11/05/2012 2:48 PM   Status: Written Related Problem: Chronic bronchitis with acute exacerbation        Raspy cough despite good PFTs. There is a bronchitis. Watch for reflux.  Plan- emphasis on smoking cessation. Walk for endurance and weight loss.  She requests letter supporting need for Heat Pump to replace old oil heat, and air conditioning. She hopes her breathing can improve.  Emphasized total smoking cessation   Please advise. Thanks.

## 2012-11-21 NOTE — Telephone Encounter (Signed)
Pt is aware that we are working on this. Will send back to [facility] as reminder and patient would like this letter asap.

## 2012-11-21 NOTE — Telephone Encounter (Signed)
Pt called back re: same. Wants to speak to [facility]. Hazel Sams

## 2012-11-22 ENCOUNTER — Encounter: Payer: Self-pay | Admitting: Internal Medicine

## 2012-11-22 NOTE — Telephone Encounter (Signed)
LMTCB-need to know if patient would like to come by and pick up letter or have letter mailed to her home address. Thanks.

## 2012-11-22 NOTE — Telephone Encounter (Signed)
Pt called back-stating to mail letter to her home address on file. Letter has been mailed and nothing more needed at this time.

## 2012-12-04 ENCOUNTER — Other Ambulatory Visit: Payer: Self-pay | Admitting: *Deleted

## 2012-12-04 MED ORDER — ALPRAZOLAM 0.25 MG PO TABS: ORAL_TABLET | ORAL | Status: DC

## 2012-12-04 MED ORDER — DEXLANSOPRAZOLE 60 MG PO CPDR: 60.0000 mg | DELAYED_RELEASE_CAPSULE | Freq: Every morning | ORAL | Status: DC

## 2012-12-04 NOTE — Telephone Encounter (Signed)
[facility] 

## 2012-12-04 NOTE — Telephone Encounter (Signed)
Please call in RX for xanax 

## 2012-12-04 NOTE — Telephone Encounter (Signed)
LAST RF 10/30/12. CALL IN [facility] [place].

## 2012-12-07 ENCOUNTER — Ambulatory Visit (INDEPENDENT_AMBULATORY_CARE_PROVIDER_SITE_OTHER): Payer: Medicaid Other | Admitting: Nurse Practitioner

## 2012-12-07 ENCOUNTER — Encounter: Payer: Self-pay | Admitting: Nurse Practitioner

## 2012-12-07 VITALS — BP 133/75 | HR 70 | Temp 97.7°F | Ht 61.0 in | Wt 171.0 lb

## 2012-12-07 DIAGNOSIS — R600 Localized edema: Secondary | ICD-10-CM | POA: Insufficient documentation

## 2012-12-07 DIAGNOSIS — I10 Essential (primary) hypertension: Secondary | ICD-10-CM | POA: Insufficient documentation

## 2012-12-07 DIAGNOSIS — E119 Type 2 diabetes mellitus without complications: Secondary | ICD-10-CM

## 2012-12-07 DIAGNOSIS — E785 Hyperlipidemia, unspecified: Secondary | ICD-10-CM

## 2012-12-07 DIAGNOSIS — E559 Vitamin D deficiency, unspecified: Secondary | ICD-10-CM

## 2012-12-07 DIAGNOSIS — E1165 Type 2 diabetes mellitus with hyperglycemia: Secondary | ICD-10-CM

## 2012-12-07 DIAGNOSIS — R609 Edema, unspecified: Secondary | ICD-10-CM

## 2012-12-07 DIAGNOSIS — E118 Type 2 diabetes mellitus with unspecified complications: Secondary | ICD-10-CM | POA: Insufficient documentation

## 2012-12-07 DIAGNOSIS — D649 Anemia, unspecified: Secondary | ICD-10-CM

## 2012-12-07 HISTORY — DX: Essential (primary) hypertension: I10

## 2012-12-07 LAB — COMPLETE METABOLIC PANEL WITH GFR
AST: 19 U/L (ref 0–37)
Albumin: 4.2 g/dL (ref 3.5–5.2)
Alkaline Phosphatase: 75 U/L (ref 39–117)
Calcium: 9.3 mg/dL (ref 8.4–10.5)
Chloride: 106 mEq/L (ref 96–112)
Glucose, Bld: 201 mg/dL — ABNORMAL HIGH (ref 70–99)
Potassium: 3.4 mEq/L — ABNORMAL LOW (ref 3.5–5.3)
Sodium: 140 mEq/L (ref 135–145)
Total Protein: 6.7 g/dL (ref 6.0–8.3)

## 2012-12-07 LAB — POCT CBC
Granulocyte percent: 63.8 %G (ref 37–80)
Lymph, poc: 1.7 (ref 0.6–3.4)
MCH, POC: 30.5 pg (ref 27–31.2)
MPV: 7.9 fL (ref 0–99.8)
POC Granulocyte: 3.5 (ref 2–6.9)
POC LYMPH PERCENT: 31 %L (ref 10–50)
Platelet Count, POC: 212 10*3/uL (ref 142–424)
RBC: 4.2 M/uL (ref 4.04–5.48)

## 2012-12-07 MED ORDER — EPINEPHRINE 0.3 MG/0.3ML IJ SOAJ: 0.3000 mg | Freq: Once | INTRAMUSCULAR | Status: DC

## 2012-12-07 MED ORDER — SERTRALINE HCL 50 MG PO TABS: 50.0000 mg | ORAL_TABLET | Freq: Every day | ORAL | Status: DC

## 2012-12-07 MED ORDER — DEXLANSOPRAZOLE 60 MG PO CPDR: 60.0000 mg | DELAYED_RELEASE_CAPSULE | Freq: Every morning | ORAL | Status: DC

## 2012-12-07 NOTE — Progress Notes (Signed)
Subjective:    Patient ID: [name] [name], female    DOB: 07/16/1951, 62 y.o.   MRN: [id number]  HPI  Patient in today for follow-up of multiple medical problems. She is doing quite well and has no complaints today. She is a diabetic and checks her BS  Up to 3x a day. Averaging in 90's in AM fasting. She denies nay low blood sugars. o numbness or tingling bil feet. Last eye exam was in July of 2013. Patient Active Problem List   Diagnosis Date Noted  . Hypertension 12/07/2012  . Peripheral edema 12/07/2012  . Type II or unspecified type diabetes mellitus without mention of complication, not stated as uncontrolled 12/07/2012  . Chronic bronchitis with acute exacerbation 08/05/2012  . Seasonal and perennial allergic rhinitis 08/05/2012  . Urticaria 08/05/2012  . Chest pain 02/06/2012  . Hyperlipidemia 02/06/2012   Current outpatient prescriptions:albuterol (PROVENTIL HFA;VENTOLIN HFA) 108 (90 BASE) MCG/ACT inhaler, Inhale 2 puffs into the lungs every 4 (four) hours as needed for wheezing or shortness of breath. Take two puffs four times a day as needed, Disp: 1 Inhaler, Rfl: prn;  albuterol (PROVENTIL) (2.5 MG/3ML) 0.083% nebulizer solution, Take 3 mLs (2.5 mg total) by nebulization every 6 (six) hours as needed for wheezing. DX  491.9, Disp: 150 mL, Rfl: 12 ALPRAZolam (XANAX) 0.25 MG tablet, TAKE ONE TABLET EVERY 8 TO 12 HOURS AS NEEDED, Disp: 60 tablet, Rfl: 0;  atorvastatin (LIPITOR) 40 MG tablet, Take 40 mg by mouth at bedtime. , Disp: , Rfl: ;  calcium carbonate (OS-CAL) 600 MG TABS, Take 600 mg by mouth daily. , Disp: , Rfl: ;  Cholecalciferol (VITAMIN D) 1000 UNITS capsule, Take 1,000 Units by mouth every morning. , Disp: , Rfl:  dexlansoprazole (DEXILANT) 60 MG capsule, Take 1 capsule (60 mg total) by mouth every morning., Disp: 30 capsule, Rfl: 2;  DYMISTA 137-50 MCG/ACT SUSP, Place 2 puffs into the nose at bedtime., Disp: 1 Bottle, Rfl: 0;  fluticasone (FLONASE) 50 MCG/ACT nasal spray,  Place 2 sprays into the nose daily., Disp: , Rfl: ;  furosemide (LASIX) 40 MG tablet, Take 40 mg by mouth every morning., Disp: , Rfl:  metFORMIN (GLUCOPHAGE) 500 MG tablet, Take 500 mg by mouth daily with breakfast. , Disp: , Rfl: ;  mometasone-formoterol (DULERA) 100-5 MCG/ACT AERO, 2 puffs then rinse mouth twice daily maintenance controller, Disp: 1 Inhaler, Rfl: prn;  Omega-3 Fatty Acids (FISH OIL) 1000 MG CAPS, Take 3 capsules by mouth at bedtime. , Disp: , Rfl: ;  sertraline (ZOLOFT) 50 MG tablet, Take 50 mg by mouth at bedtime. , Disp: , Rfl:  vitamin B-12 (CYANOCOBALAMIN) 1000 MCG tablet, Take 1,000 mcg by mouth daily. , Disp: , Rfl: ;  benzonatate (TESSALON) 200 MG capsule, Take 1 capsule (200 mg total) by mouth 3 (three) times daily as needed for cough., Disp: 20 capsule, Rfl: prn;  Olopatadine HCl (PATADAY) 0.2 % SOLN, Apply 1 drop to eye daily., Disp: , Rfl: ;  sucralfate (CARAFATE) 1 GM/10ML suspension, Take 10 mLs (1 g total) by mouth 4 (four) times daily., Disp: 420 mL, Rfl: 1    Review of Systems  Constitutional: Negative for appetite change and fatigue.  HENT: Negative.   Eyes: Negative for visual disturbance.  Respiratory: Negative for cough, chest tightness and shortness of breath.   Cardiovascular: Negative for chest pain, palpitations and leg swelling.  Gastrointestinal: Negative for abdominal pain.  Genitourinary: Negative for frequency and difficulty urinating.  Neurological: Negative for dizziness,  numbness and headaches.  Hematological: Negative.   Psychiatric/Behavioral: Negative.        Objective:   Physical Exam  Constitutional: She is oriented to person, place, and time. She appears well-developed and well-nourished.  HENT:  Nose: Nose normal.  Mouth/Throat: Oropharynx is clear and moist.  Eyes: EOM are normal.  Neck: Trachea normal, normal range of motion and full passive range of motion without pain. Neck supple. No JVD present. Carotid bruit is not present.  No thyromegaly present.  Cardiovascular: Normal rate, regular rhythm, normal heart sounds and intact distal pulses.  Exam reveals no gallop and no friction rub.   No murmur heard. Pulmonary/Chest: Effort normal and breath sounds normal.  Abdominal: Soft. Bowel sounds are normal. She exhibits no distension and no mass. There is no tenderness.  Musculoskeletal: Normal range of motion.  Lymphadenopathy:    She has no cervical adenopathy.  Neurological: She is alert and oriented to person, place, and time. She has normal reflexes.  (+) 4/4 monofilament bil feet  Skin: Skin is warm and dry.  No callus formation   Psychiatric: She has a normal mood and affect. Her behavior is normal. Judgment and thought content normal.    BP 133/75  Pulse 70  Temp(Src) 97.7 F (36.5 C) (Oral)  Ht 5\' 1"  (1.549 m)  Wt 171 lb (77.565 kg)  BMI 32.33 kg/m2 Results for orders placed in visit on 12/07/12  POCT GLYCOSYLATED HEMOGLOBIN (HGB A1C)      Result Value Range   Hemoglobin A1C 7.3  %    POCT CBC      Result Value Range   WBC 5.5  4.6 - 10.2 K/uL   Lymph, poc 1.7  0.6 - 3.4   POC LYMPH PERCENT 31.0  10 - 50 %L   POC Granulocyte 3.5  2 - 6.9   Granulocyte percent 63.8  37 - 80 %G   RBC 4.2  4.04 - 5.48 M/uL   Hemoglobin 12.9  12.2 - 16.2 g/dL   HCT, POC 47.8 (*) 29.5 - 47.9 %   MCV 85.5  80 - 97 fL   MCH, POC 30.5  27 - 31.2 pg   MCHC 35.7 (*) 31.8 - 35.4 g/dL   RDW, POC 62.1     Platelet Count, POC 212.0  142 - 424 K/uL   MPV 7.9  0 - 99.8 fL         Assessment & Plan:  Type II or unspecified type diabetes mellitus with unspecified complication, uncontrolled - Plan: POCT glycosylated hemoglobin (Hb A1C)  Hypertension - Plan: COMPLETE METABOLIC PANEL WITH GFR  Anemia - Plan: POCT CBC  Unspecified vitamin D deficiency - Plan: Vitamin D 25 hydroxy  Hyperlipidemia - Plan: NMR Lipoprofile with Lipids  Peripheral edema  Type II or unspecified type diabetes mellitus without mention of  complication, not stated as uncontrolled    Medication List       These changes are accurate as of: 12/07/2012  3:06 PM. If you have any questions, ask your nurse or doctor.          STOP taking these medications       rosuvastatin 20 MG tablet  Commonly known as:  CRESTOR  Stopped by:  Bennie Pierini, FNP      TAKE these medications       albuterol (2.5 MG/3ML) 0.083% nebulizer solution  Commonly known as:  PROVENTIL  Take 3 mLs (2.5 mg total) by nebulization every 6 (six) hours as  needed for wheezing. DX  491.9     albuterol 108 (90 BASE) MCG/ACT inhaler  Commonly known as:  PROVENTIL HFA;VENTOLIN HFA  Inhale 2 puffs into the lungs every 4 (four) hours as needed for wheezing or shortness of breath. Take two puffs four times a day as needed     ALPRAZolam 0.25 MG tablet  Commonly known as:  XANAX  TAKE ONE TABLET EVERY 8 TO 12 HOURS AS NEEDED     atorvastatin 40 MG tablet  Commonly known as:  LIPITOR  Take 40 mg by mouth at bedtime.     benzonatate 200 MG capsule  Commonly known as:  TESSALON  Take 1 capsule (200 mg total) by mouth 3 (three) times daily as needed for cough.     calcium carbonate 600 MG Tabs  Commonly known as:  OS-CAL  Take 600 mg by mouth daily.     dexlansoprazole 60 MG capsule  Commonly known as:  DEXILANT  Take 1 capsule (60 mg total) by mouth every morning.     DYMISTA 137-50 MCG/ACT Susp  Generic drug:  Azelastine-Fluticasone  Place 2 puffs into the nose at bedtime.     EPINEPHrine 0.3 mg/0.3 mL Devi  Commonly known as:  EPIPEN  Inject 0.3 mLs (0.3 mg total) into the muscle once.  Started by:  Bennie Pierini, FNP     Fish Oil 1000 MG Caps  Take 3 capsules by mouth at bedtime.     fluticasone 50 MCG/ACT nasal spray  Commonly known as:  FLONASE  Place 2 sprays into the nose daily.     furosemide 40 MG tablet  Commonly known as:  LASIX  Take 40 mg by mouth every morning.     metFORMIN 500 MG tablet  Commonly known as:   GLUCOPHAGE  Take 500 mg by mouth daily with breakfast.     mometasone-formoterol 100-5 MCG/ACT Aero  Commonly known as:  DULERA  2 puffs then rinse mouth twice daily maintenance controller     PATADAY 0.2 % Soln  Generic drug:  Olopatadine HCl  Apply 1 drop to eye daily.     sertraline 50 MG tablet  Commonly known as:  ZOLOFT  Take 1 tablet (50 mg total) by mouth at bedtime.     sucralfate 1 GM/10ML suspension  Commonly known as:  CARAFATE  Take 10 mLs (1 g total) by mouth 4 (four) times daily.     vitamin B-12 1000 MCG tablet  Commonly known as:  CYANOCOBALAMIN  Take 1,000 mcg by mouth daily.     Vitamin D 1000 UNITS capsule  Take 1,000 Units by mouth every morning.       Low fat diet an dexercise Continue all meds Labs pending F/U in 3 months Mary-Margaret Daphine Deutscher, FNP

## 2012-12-08 LAB — VITAMIN D 25 HYDROXY (VIT D DEFICIENCY, FRACTURES): Vit D, 25-Hydroxy: 58 ng/mL (ref 30–89)

## 2012-12-11 LAB — NMR LIPOPROFILE WITH LIPIDS
Cholesterol, Total: 129 mg/dL (ref <200)
Large HDL-P: 2.9 umol/L — ABNORMAL LOW (ref >=4.8)
Large VLDL-P: 3.5 nmol/L — ABNORMAL HIGH (ref <=2.7)
Small LDL Particle Number: 530 nmol/L — ABNORMAL HIGH (ref <=527)
Triglycerides: 147 mg/dL (ref <150)

## 2013-01-04 NOTE — Progress Notes (Signed)
Surgery scheduled for 01/17/13.  Preop 01/08/13 at 130pm.  Need orders in EPIC.  Thanks.

## 2013-01-07 ENCOUNTER — Encounter (HOSPITAL_COMMUNITY): Payer: Self-pay | Admitting: Pharmacy Technician

## 2013-01-08 ENCOUNTER — Encounter (HOSPITAL_COMMUNITY): Payer: Self-pay

## 2013-01-08 ENCOUNTER — Encounter (HOSPITAL_COMMUNITY)
Admission: RE | Admit: 2013-01-08 | Discharge: 2013-01-08 | Disposition: A | Discharge status: Home / Self Care | Payer: Medicaid Other | Source: Ambulatory Visit | Attending: Orthopedic Surgery | Admitting: Orthopedic Surgery

## 2013-01-08 DIAGNOSIS — I219 Acute myocardial infarction, unspecified: Secondary | ICD-10-CM

## 2013-01-08 DIAGNOSIS — Z87442 Personal history of urinary calculi: Secondary | ICD-10-CM

## 2013-01-08 HISTORY — DX: Acute myocardial infarction, unspecified: I21.9

## 2013-01-08 HISTORY — DX: Personal history of urinary calculi: Z87.442

## 2013-01-08 HISTORY — PX: ESOPHAGEAL DILATION: SHX303

## 2013-01-08 LAB — URINALYSIS, ROUTINE W REFLEX MICROSCOPIC
Bilirubin Urine: NEGATIVE
Glucose, UA: NEGATIVE mg/dL
Hgb urine dipstick: NEGATIVE
Ketones, ur: NEGATIVE mg/dL
Leukocytes, UA: NEGATIVE
Nitrite: NEGATIVE
Protein, ur: NEGATIVE mg/dL
Specific Gravity, Urine: 1.018 (ref 1.005–1.030)
Urobilinogen, UA: 0.2 mg/dL (ref 0.0–1.0)
pH: 5 (ref 5.0–8.0)

## 2013-01-08 LAB — COMPREHENSIVE METABOLIC PANEL
ALT: 28 U/L (ref 0–35)
AST: 18 U/L (ref 0–37)
Albumin: 3.8 g/dL (ref 3.5–5.2)
Alkaline Phosphatase: 100 U/L (ref 39–117)
BUN: 11 mg/dL (ref 6–23)
CO2: 26 mEq/L (ref 19–32)
Calcium: 9.5 mg/dL (ref 8.4–10.5)
Chloride: 102 mEq/L (ref 96–112)
Creatinine, Ser: 0.56 mg/dL (ref 0.50–1.10)
GFR calc Af Amer: >90 mL/min (ref >90)
GFR calc non Af Amer: >90 mL/min (ref >90)
Glucose, Bld: 134 mg/dL — ABNORMAL HIGH (ref 70–99)
Potassium: 3.7 mEq/L (ref 3.5–5.1)
Sodium: 137 mEq/L (ref 135–145)
Total Bilirubin: 0.2 mg/dL — ABNORMAL LOW (ref 0.3–1.2)
Total Protein: 7.3 g/dL (ref 6.0–8.3)

## 2013-01-08 LAB — CBC
MCH: 27.3 pg (ref 26.0–34.0)
MCV: 83.4 fL (ref 78.0–100.0)
Platelets: 239 10*3/uL (ref 150–400)
RDW: 14.5 % (ref 11.5–15.5)
WBC: 7.3 10*3/uL (ref 4.0–10.5)

## 2013-01-08 LAB — PROTIME-INR
INR: 0.94 (ref 0.00–1.49)
Prothrombin Time: 12.5 seconds (ref 11.6–15.2)

## 2013-01-08 LAB — APTT: aPTT: 33 seconds (ref 24–37)

## 2013-01-08 NOTE — Patient Instructions (Addendum)
22 [name] [name]  01/08/2013   Your procedure is scheduled on: 6-19  -2014  Report to [facility] at      1100  AM.  Call this number if you have problems the morning of surgery: [phone]  Or Presurgical Testing [phone](Sharion Grieves)       Do not eat food:After Midnight.  May have clear liquids:up to 6 Hours before arrival. Nothing after : 0800 am  Clear liquids include soda, tea, black coffee, apple or grape juice, broth.  Take these medicines the morning of surgery with A SIP OF WATER: Xanax. Dexilant. Use inhalers - Albuterol, Advair, Dulera and bring. Bring Flonase. Do not take any Diabetic meds AM of .   Do not wear jewelry, make-up or nail polish.  Do not wear lotions, powders, or perfumes. You may wear deodorant.  Do not shave 12 hours prior to first CHG shower(legs and under arms).(face and neck okay.)  Do not bring valuables to the hospital.  Contacts, dentures or bridgework,body piercing,  may not be worn into surgery.  Leave suitcase in the car. After surgery it may be brought to your room.  For patients admitted to the hospital, checkout time is 11:00 AM the day of discharge.   Patients discharged the day of surgery will not be allowed to drive home. Must have responsible person with you x 24 hours once discharged.  Name and phone number of your driver: Claudette Wermuth- [phone]  Special Instructions: CHG(Chlorhedine 4%-"Hibiclens","Betasept","Aplicare") Shower Use Special Wash: see special instructions.(avoid face and genitals)   Please read over the following fact sheets that you were given: MRSA Information, Blood Transfusion fact sheet, Incentive Spirometry Instruction.    Failure to follow these instructions may result in Cancellation of your surgery.   Patient signature_______________________________________________________

## 2013-01-08 NOTE — Pre-Procedure Instructions (Signed)
01-08-13 EKG-7'13/ Cxr 7'25- epic

## 2013-01-13 NOTE — H&P (Signed)
TOTAL KNEE ADMISSION H&P  Patient is being admitted for left total knee arthroplasty.  Subjective:  Chief Complaint:left knee pain.  HPI: [name] [name], 62 y.o. female, has a history of pain and functional disability in the left knee due to arthritis and has failed non-surgical conservative treatments for greater than 12 weeks to includeNSAID's and/or analgesics, corticosteriod injections and activity modification.  Onset of symptoms was gradual, starting 5 years ago with gradually worsening course since that time. The patient noted prior procedures on the knee to include  arthroscopy and menisectomy on the left knee(s).  Patient currently rates pain in the left knee(s) at 6 out of 10 with activity. Patient has night pain, worsening of pain with activity and weight bearing, pain that interferes with activities of daily living, pain with passive range of motion, crepitus and joint swelling.  Patient has evidence of periarticular osteophytes and joint space narrowing by imaging studies.  There is no active infection.  Patient Active Problem List   Diagnosis Date Noted  . Hypertension 12/07/2012  . Peripheral edema 12/07/2012  . Type II or unspecified type diabetes mellitus without mention of complication, not stated as uncontrolled 12/07/2012  . Chronic bronchitis with acute exacerbation 08/05/2012  . Seasonal and perennial allergic rhinitis 08/05/2012  . Urticaria 08/05/2012  . Chest pain 02/06/2012  . Hyperlipidemia 02/06/2012   Past Medical History  Diagnosis Date  . Insomnia, unspecified   . Esophageal reflux   . Hyperlipemia   . Asthma   . Osteoporosis   . Arthritis   . COPD (chronic obstructive pulmonary disease)   . Depression   . Personal history of colonic polyps 09/2010    TUBULAR ADENOMAS (X3); NEGATIVE FOR HIGH GRADE DYSPLASIA OR MALIGNANCY.  . Barrett esophagus   . Hiatal hernia   . Esophageal stricture   . GERD (gastroesophageal reflux disease)   . GAD (generalized  anxiety disorder)   . Aortic valve disorders   . Dysrhythmia     heart skips per pt   . Heart murmur   . Anxiety   . Shortness of breath     with exertion   . Pneumonia     hx of   . Type II or unspecified type diabetes mellitus without mention of complication, not stated as uncontrolled     on no meds   . Anemia     hx of years ago   . Hypertension 12/07/2012  . Myocardial infarction 01-08-13    '06-Chest pain-no stent-dx. MI-stress related  . 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 History of kidney stones 01-08-13    past hx.  . Transfusion history 01-08-13    past hx. many yrs ago    Past Surgical History  Procedure Laterality Date  . Cholecystectomy  2005  . Partial hysterectomy    . Shoulder surgery  2011  . Tubal ligation    . Tonsillectomy    . Foot surgery  2005    Left foot  . Wrist flexion tendon tenotomies and proximal corpectomy w/ wrist arthrodesis&iliac crest bone graft    . Cardiac catheterization      normal per Dr Elease Hashimoto in note dated 02/06/12   . Knee arthroscopy  02/28/2012    Procedure: ARTHROSCOPY KNEE;  Surgeon: Jacki Cones, MD;  Location: [facility];  Service: Orthopedics;  Laterality: Left;  . Esophageal dilation  01-08-13    2 yrs ago  . Cataract extraction,  bilateral       Current outpatient prescriptions: albuterol (PROVENTIL HFA;VENTOLIN HFA) 108 (90 BASE) MCG/ACT inhaler, Inhale 2 puffs into the lungs every 4 (four) hours as needed for wheezing or shortness of breath. Take two puffs four times a day as needed, Disp: 1 Inhaler, Rfl: prn;  albuterol (PROVENTIL) (2.5 MG/3ML) 0.083% nebulizer solution, Take 3 mLs (2.5 mg total) by nebulization every 6 (six) hours as needed for wheezing. DX  491.9, Disp: 150 mL, Rfl: 12 ALPRAZolam (XANAX) 0.25 MG tablet, Take 0.25 mg by mouth every 8 (eight) hours as needed for anxiety., Disp: , Rfl: ;  aspirin EC 81 MG tablet, Take 81 mg by mouth daily., Disp: , Rfl: ;  atorvastatin (LIPITOR) 40 MG  tablet, Take 40 mg by mouth at bedtime. , Disp: , Rfl: ;  benzonatate (TESSALON) 200 MG capsule, Take 1 capsule (200 mg total) by mouth 3 (three) times daily as needed for cough., Disp: 20 capsule, Rfl: prn calcium carbonate (OS-CAL) 600 MG TABS, Take 600 mg by mouth daily. , Disp: , Rfl: ;  Cholecalciferol (VITAMIN D) 1000 UNITS capsule, Take 1,000 Units by mouth every morning. , Disp: , Rfl: ;  dexlansoprazole (DEXILANT) 60 MG capsule, Take 1 capsule (60 mg total) by mouth every morning., Disp: 30 capsule, Rfl: 5;  EPINEPHrine (EPIPEN) 0.3 mg/0.3 mL DEVI, Inject 0.3 mLs (0.3 mg total) into the muscle once., Disp: 1 Device, Rfl: 1 fluticasone (FLONASE) 50 MCG/ACT nasal spray, Place 2 sprays into the nose daily., Disp: , Rfl: ;  Fluticasone-Salmeterol (ADVAIR) 500-50 MCG/DOSE AEPB, Inhale 1 puff into the lungs every 12 (twelve) hours., Disp: , Rfl: ;  furosemide (LASIX) 40 MG tablet, Take 40 mg by mouth every morning., Disp: , Rfl: ;  metFORMIN (GLUCOPHAGE) 500 MG tablet, Take 500 mg by mouth daily with breakfast. Takes at bedtime., Disp: , Rfl:  mometasone-formoterol (DULERA) 100-5 MCG/ACT AERO, 2 puffs then rinse mouth twice daily maintenance controller, Disp: 1 Inhaler, Rfl: prn;  sertraline (ZOLOFT) 50 MG tablet, Take 1 tablet (50 mg total) by mouth at bedtime., Disp: 30 tablet, Rfl: 5;  vitamin B-12 (CYANOCOBALAMIN) 1000 MCG tablet, Take 1,000 mcg by mouth daily. , Disp: , Rfl:   Allergies  Allergen Reactions  . Boniva (Ibandronate Sodium)     Jaw popping  . Ceclor (Cefaclor) Other (See Comments)    Reaction=burning all over  . Codeine Nausea And Vomiting  . Cortisone Hives    All over body  . Penicillins     History  Substance Use Topics  . Smoking status: Current Some Day Smoker -- 0.25 packs/day for 40 years    Types: Cigarettes    Last Attempt to Quit: 06/24/2012  . Smokeless tobacco: Former Neurosurgeon    Types: Snuff, Chew    Quit date: 12/31/2011     Comment: 4-5 cigs per day///01-08-13   . Alcohol Use: 0.6 oz/week    1 Cans of beer per week     Comment: occasionally    Family History  Problem Relation Age of Onset  . Breast cancer Mother   . Diabetes Maternal Grandfather   . Kidney disease      Both sides of family  . Heart disease      Both sides of family  . Colon cancer Brother 9  . Colon polyps Brother   . Breast cancer Maternal Aunt   . Breast cancer Maternal Grandmother      Review of Systems  Constitutional: Negative.   HENT:  Negative.  Negative for neck pain.   Eyes: Negative.   Respiratory: Negative.   Cardiovascular: Negative.   Gastrointestinal: Negative.   Genitourinary: Negative.   Musculoskeletal: Positive for joint pain. Negative for myalgias, back pain and falls.       Left knee pain  Skin: Negative.   Neurological: Negative.   Endo/Heme/Allergies: Negative.   Psychiatric/Behavioral: Negative.     Objective:  Physical Exam  Constitutional: She is oriented to person, place, and time. She appears well-developed and well-nourished. No distress.  HENT:  Head: Normocephalic and atraumatic.  Right Ear: External ear normal.  Left Ear: External ear normal.  Nose: Nose normal.  Mouth/Throat: Oropharynx is clear and moist.  Eyes: Conjunctivae and EOM are normal.  Neck: Normal range of motion. Neck supple. No tracheal deviation present. No thyromegaly present.  Cardiovascular: Normal rate, regular rhythm, normal heart sounds and intact distal pulses.   No murmur heard. Respiratory: Effort normal and breath sounds normal. No respiratory distress. She has no wheezes. She exhibits no tenderness.  GI: Soft. Bowel sounds are normal. She exhibits no distension and no mass. There is no tenderness.  Musculoskeletal:       Right hip: Normal.       Left hip: Normal.       Right knee: Normal.       Left knee: She exhibits decreased range of motion and swelling. She exhibits no effusion and no erythema. Tenderness found. Medial joint line and lateral  joint line tenderness noted.  Lymphadenopathy:    She has no cervical adenopathy.  Neurological: She is alert and oriented to person, place, and time. She has normal strength and normal reflexes. No sensory deficit.  Skin: No rash noted. She is not diaphoretic. No erythema.  Psychiatric: She has a normal mood and affect. Her behavior is normal.   Vitals Pulse: 72 (Regular) BP: 167/80 (Sitting, Left Arm, Standard)  Estimated body mass index is 32.33 kg/(m^2) as calculated from the following:   Height as of 12/07/12: 5\' 1"  (1.549 m).   Weight as of 12/07/12: 77.565 kg (171 lb).   Imaging Review Plain radiographs demonstrate severe degenerative joint disease of the left knee(s). The overall alignment ismild varus. The bone quality appears to be good for age and reported activity level.  Assessment/Plan:  End stage arthritis, left knee   The patient history, physical examination, clinical judgment of the provider and imaging studies are consistent with end stage degenerative joint disease of the left knee(s) and total knee arthroplasty is deemed medically necessary. The treatment options including medical management, injection therapy arthroscopy and arthroplasty were discussed at length. The risks and benefits of total knee arthroplasty were presented and reviewed. The risks due to aseptic loosening, infection, stiffness, patella tracking problems, thromboembolic complications and other imponderables were discussed. The patient acknowledged the explanation, agreed to proceed with the plan and consent was signed. Patient is being admitted for inpatient treatment for surgery, pain control, PT, OT, prophylactic antibiotics, VTE prophylaxis, progressive ambulation and ADL's and discharge planning. The patient is planning to be discharged home with home health services     [place], [place]

## 2013-01-17 ENCOUNTER — Encounter (HOSPITAL_COMMUNITY): Payer: Self-pay | Admitting: Certified Registered Nurse Anesthetist

## 2013-01-17 ENCOUNTER — Inpatient Hospital Stay (HOSPITAL_COMMUNITY): Payer: Medicaid Other

## 2013-01-17 ENCOUNTER — Encounter (HOSPITAL_COMMUNITY)
Admission: RE | Disposition: A | Discharge status: Home Health | Payer: Self-pay | Source: Ambulatory Visit | Attending: Orthopedic Surgery

## 2013-01-17 ENCOUNTER — Inpatient Hospital Stay (HOSPITAL_COMMUNITY): Payer: Medicaid Other | Admitting: Certified Registered Nurse Anesthetist

## 2013-01-17 ENCOUNTER — Inpatient Hospital Stay (HOSPITAL_COMMUNITY)
Admission: RE | Admit: 2013-01-17 | Discharge: 2013-01-19 | DRG: 470 | Disposition: A | Discharge status: Home Health | Payer: Medicaid Other | Source: Ambulatory Visit | Attending: Orthopedic Surgery | Admitting: Orthopedic Surgery

## 2013-01-17 ENCOUNTER — Encounter (HOSPITAL_COMMUNITY): Payer: Self-pay | Admitting: *Deleted

## 2013-01-17 DIAGNOSIS — Z01812 Encounter for preprocedural laboratory examination: Secondary | ICD-10-CM

## 2013-01-17 DIAGNOSIS — E119 Type 2 diabetes mellitus without complications: Secondary | ICD-10-CM | POA: Diagnosis present

## 2013-01-17 DIAGNOSIS — M1712 Unilateral primary osteoarthritis, left knee: Secondary | ICD-10-CM | POA: Diagnosis present

## 2013-01-17 DIAGNOSIS — M1710 Unilateral primary osteoarthritis, unspecified knee: Principal | ICD-10-CM | POA: Diagnosis present

## 2013-01-17 DIAGNOSIS — E785 Hyperlipidemia, unspecified: Secondary | ICD-10-CM | POA: Diagnosis present

## 2013-01-17 DIAGNOSIS — I10 Essential (primary) hypertension: Secondary | ICD-10-CM | POA: Diagnosis present

## 2013-01-17 DIAGNOSIS — D62 Acute posthemorrhagic anemia: Secondary | ICD-10-CM | POA: Diagnosis not present

## 2013-01-17 DIAGNOSIS — M24569 Contracture, unspecified knee: Secondary | ICD-10-CM | POA: Diagnosis present

## 2013-01-17 HISTORY — PX: TOTAL KNEE ARTHROPLASTY: SHX125

## 2013-01-17 LAB — GLUCOSE, CAPILLARY: Glucose-Capillary: 200 mg/dL — ABNORMAL HIGH (ref 70–99)

## 2013-01-17 LAB — TYPE AND SCREEN
ABO/RH(D): O POS
Antibody Screen: NEGATIVE

## 2013-01-17 SURGERY — ARTHROPLASTY, KNEE, TOTAL
Anesthesia: Spinal | Site: Knee | Laterality: Left | Wound class: Clean

## 2013-01-17 MED ORDER — ONDANSETRON HCL 4 MG PO TABS
4.0000 mg | ORAL_TABLET | Freq: Four times a day (QID) | ORAL | Status: DC | PRN
  Administered 2013-01-17: 4 mg via ORAL
  Filled 2013-01-17: qty 1

## 2013-01-17 MED ORDER — MENTHOL 3 MG MT LOZG
1.0000 | LOZENGE | OROMUCOSAL | Status: DC | PRN
  Filled 2013-01-17: qty 9

## 2013-01-17 MED ORDER — VITAMIN B-12 1000 MCG PO TABS
1000.0000 ug | ORAL_TABLET | Freq: Every day | ORAL | Status: DC
  Administered 2013-01-18 – 2013-01-19 (×2): 1000 ug via ORAL
  Filled 2013-01-17 (×3): qty 1

## 2013-01-17 MED ORDER — HYDROMORPHONE HCL 2 MG PO TABS
2.0000 mg | ORAL_TABLET | ORAL | Status: DC | PRN
  Administered 2013-01-17 – 2013-01-19 (×10): 2 mg via ORAL
  Filled 2013-01-17 (×10): qty 1

## 2013-01-17 MED ORDER — CHLORHEXIDINE GLUCONATE 4 % EX LIQD
60.0000 mL | Freq: Once | CUTANEOUS | Status: DC
  Filled 2013-01-17: qty 60

## 2013-01-17 MED ORDER — HYDROMORPHONE HCL PF 1 MG/ML IJ SOLN
1.0000 mg | INTRAMUSCULAR | Status: DC | PRN
  Administered 2013-01-17: 0.25 mg via INTRAVENOUS
  Administered 2013-01-18: 1 mg via INTRAVENOUS
  Filled 2013-01-17: qty 1

## 2013-01-17 MED ORDER — HYDROMORPHONE HCL PF 1 MG/ML IJ SOLN
0.2500 mg | INTRAMUSCULAR | Status: DC | PRN
  Administered 2013-01-17: 0.25 mg via INTRAVENOUS

## 2013-01-17 MED ORDER — SODIUM CHLORIDE 0.9 % IR SOLN
Status: DC | PRN
  Administered 2013-01-17: 3000 mL

## 2013-01-17 MED ORDER — PANTOPRAZOLE SODIUM 40 MG PO TBEC
40.0000 mg | DELAYED_RELEASE_TABLET | Freq: Every day | ORAL | Status: DC
  Administered 2013-01-18 – 2013-01-19 (×2): 40 mg via ORAL
  Filled 2013-01-17 (×2): qty 1

## 2013-01-17 MED ORDER — FLUTICASONE PROPIONATE 50 MCG/ACT NA SUSP
2.0000 | Freq: Every day | NASAL | Status: DC
  Administered 2013-01-18 – 2013-01-19 (×2): 2 via NASAL
  Filled 2013-01-17: qty 16

## 2013-01-17 MED ORDER — BUPIVACAINE LIPOSOME 1.3 % IJ SUSP
20.0000 mL | Freq: Once | INTRAMUSCULAR | Status: DC
  Filled 2013-01-17: qty 20

## 2013-01-17 MED ORDER — PROPOFOL 10 MG/ML IV BOLUS
INTRAVENOUS | Status: DC | PRN
  Administered 2013-01-17: 20 mg via INTRAVENOUS
  Administered 2013-01-17: 160 mg via INTRAVENOUS
  Administered 2013-01-17: 20 mg via INTRAVENOUS

## 2013-01-17 MED ORDER — FLEET ENEMA 7-19 GM/118ML RE ENEM: 1.0000 | ENEMA | Freq: Once | RECTAL | Status: AC | PRN

## 2013-01-17 MED ORDER — ONDANSETRON HCL 4 MG/2ML IJ SOLN: 4.0000 mg | Freq: Four times a day (QID) | INTRAMUSCULAR | Status: DC | PRN

## 2013-01-17 MED ORDER — ACETAMINOPHEN 650 MG RE SUPP: 650.0000 mg | Freq: Four times a day (QID) | RECTAL | Status: DC | PRN

## 2013-01-17 MED ORDER — FUROSEMIDE 40 MG PO TABS
40.0000 mg | ORAL_TABLET | Freq: Every morning | ORAL | Status: DC
  Administered 2013-01-18 – 2013-01-19 (×2): 40 mg via ORAL
  Filled 2013-01-17 (×2): qty 1

## 2013-01-17 MED ORDER — KETAMINE HCL 10 MG/ML IJ SOLN
INTRAMUSCULAR | Status: DC | PRN
  Administered 2013-01-17: 20 mg via INTRAVENOUS

## 2013-01-17 MED ORDER — CALCIUM CARBONATE 1250 (500 CA) MG PO TABS
1.0000 | ORAL_TABLET | Freq: Every day | ORAL | Status: DC
  Administered 2013-01-18 – 2013-01-19 (×2): 500 mg via ORAL
  Filled 2013-01-17 (×3): qty 1

## 2013-01-17 MED ORDER — ALUM & MAG HYDROXIDE-SIMETH 200-200-20 MG/5ML PO SUSP
30.0000 mL | ORAL | Status: DC | PRN
  Administered 2013-01-18: 30 mL via ORAL
  Filled 2013-01-17: qty 30

## 2013-01-17 MED ORDER — BUPIVACAINE LIPOSOME 1.3 % IJ SUSP
INTRAMUSCULAR | Status: DC | PRN
  Administered 2013-01-17: 20 mL

## 2013-01-17 MED ORDER — SODIUM CHLORIDE 0.9 % IR SOLN
Status: DC | PRN
  Administered 2013-01-17: 14:00:00

## 2013-01-17 MED ORDER — PROMETHAZINE HCL 25 MG/ML IJ SOLN: 6.2500 mg | INTRAMUSCULAR | Status: DC | PRN

## 2013-01-17 MED ORDER — ALBUTEROL SULFATE (5 MG/ML) 0.5% IN NEBU
2.5000 mg | INHALATION_SOLUTION | RESPIRATORY_TRACT | Status: DC
  Administered 2013-01-17 – 2013-01-18 (×3): 2.5 mg via RESPIRATORY_TRACT
  Filled 2013-01-17 (×4): qty 0.5

## 2013-01-17 MED ORDER — METHOCARBAMOL 500 MG PO TABS
500.0000 mg | ORAL_TABLET | Freq: Four times a day (QID) | ORAL | Status: DC | PRN
  Administered 2013-01-17 – 2013-01-18 (×4): 500 mg via ORAL
  Filled 2013-01-17 (×4): qty 1

## 2013-01-17 MED ORDER — ONDANSETRON HCL 4 MG/2ML IJ SOLN
INTRAMUSCULAR | Status: DC | PRN
  Administered 2013-01-17: 4 mg via INTRAVENOUS

## 2013-01-17 MED ORDER — BISACODYL 10 MG RE SUPP: 10.0000 mg | Freq: Every day | RECTAL | Status: DC | PRN

## 2013-01-17 MED ORDER — CLINDAMYCIN PHOSPHATE 600 MG/50ML IV SOLN
600.0000 mg | Freq: Four times a day (QID) | INTRAVENOUS | Status: AC
  Administered 2013-01-17 – 2013-01-18 (×2): 600 mg via INTRAVENOUS
  Filled 2013-01-17 (×2): qty 50

## 2013-01-17 MED ORDER — CELECOXIB 200 MG PO CAPS
200.0000 mg | ORAL_CAPSULE | Freq: Two times a day (BID) | ORAL | Status: DC
  Administered 2013-01-17 – 2013-01-19 (×4): 200 mg via ORAL
  Filled 2013-01-17 (×5): qty 1

## 2013-01-17 MED ORDER — GLYCOPYRROLATE 0.2 MG/ML IJ SOLN
INTRAMUSCULAR | Status: DC | PRN
  Administered 2013-01-17: 0.2 mg via INTRAVENOUS

## 2013-01-17 MED ORDER — CLINDAMYCIN PHOSPHATE 900 MG/50ML IV SOLN
900.0000 mg | INTRAVENOUS | Status: AC
  Administered 2013-01-17: 900 mg via INTRAVENOUS

## 2013-01-17 MED ORDER — RIVAROXABAN 10 MG PO TABS
10.0000 mg | ORAL_TABLET | Freq: Every day | ORAL | Status: DC
  Administered 2013-01-18 – 2013-01-19 (×2): 10 mg via ORAL
  Filled 2013-01-17 (×3): qty 1

## 2013-01-17 MED ORDER — SODIUM CHLORIDE 0.9 % IJ SOLN
INTRAMUSCULAR | Status: DC | PRN
  Administered 2013-01-17: 20 mL

## 2013-01-17 MED ORDER — PHENOL 1.4 % MT LIQD
1.0000 | OROMUCOSAL | Status: DC | PRN
  Filled 2013-01-17: qty 177

## 2013-01-17 MED ORDER — METFORMIN HCL 500 MG PO TABS
500.0000 mg | ORAL_TABLET | Freq: Every day | ORAL | Status: DC
  Administered 2013-01-18 – 2013-01-19 (×2): 500 mg via ORAL
  Filled 2013-01-17 (×3): qty 1

## 2013-01-17 MED ORDER — ACETAMINOPHEN 10 MG/ML IV SOLN: 1000.0000 mg | Freq: Once | INTRAVENOUS | Status: DC | PRN

## 2013-01-17 MED ORDER — DEXAMETHASONE SODIUM PHOSPHATE 10 MG/ML IJ SOLN
INTRAMUSCULAR | Status: DC | PRN
  Administered 2013-01-17: 10 mg via INTRAVENOUS

## 2013-01-17 MED ORDER — MEPERIDINE HCL 50 MG/ML IJ SOLN: 6.2500 mg | INTRAMUSCULAR | Status: DC | PRN

## 2013-01-17 MED ORDER — MOMETASONE FURO-FORMOTEROL FUM 200-5 MCG/ACT IN AERO
2.0000 | INHALATION_SPRAY | Freq: Two times a day (BID) | RESPIRATORY_TRACT | Status: DC
  Filled 2013-01-17: qty 8.8

## 2013-01-17 MED ORDER — HEMOSTATIC AGENTS (NO CHARGE) OPTIME
TOPICAL | Status: DC | PRN
  Administered 2013-01-17: 1 via TOPICAL

## 2013-01-17 MED ORDER — SERTRALINE HCL 50 MG PO TABS
50.0000 mg | ORAL_TABLET | Freq: Every day | ORAL | Status: DC
  Administered 2013-01-17 – 2013-01-18 (×2): 50 mg via ORAL
  Filled 2013-01-17 (×3): qty 1

## 2013-01-17 MED ORDER — BUPIVACAINE IN DEXTROSE 0.75-8.25 % IT SOLN
INTRATHECAL | Status: DC | PRN
  Administered 2013-01-17: 2 mL via INTRATHECAL

## 2013-01-17 MED ORDER — MOMETASONE FURO-FORMOTEROL FUM 100-5 MCG/ACT IN AERO
2.0000 | INHALATION_SPRAY | Freq: Two times a day (BID) | RESPIRATORY_TRACT | Status: DC
Start: 2013-01-17 — End: 2013-01-19
  Administered 2013-01-17 – 2013-01-19 (×4): 2 via RESPIRATORY_TRACT
  Filled 2013-01-17: qty 8.8

## 2013-01-17 MED ORDER — POLYETHYLENE GLYCOL 3350 17 G PO PACK: 17.0000 g | PACK | Freq: Every day | ORAL | Status: DC | PRN

## 2013-01-17 MED ORDER — LIDOCAINE HCL (CARDIAC) 20 MG/ML IV SOLN
INTRAVENOUS | Status: DC | PRN
  Administered 2013-01-17: 100 mg via INTRAVENOUS

## 2013-01-17 MED ORDER — FENTANYL CITRATE 0.05 MG/ML IJ SOLN
INTRAMUSCULAR | Status: DC | PRN
  Administered 2013-01-17 (×2): 50 ug via INTRAVENOUS

## 2013-01-17 MED ORDER — ALBUTEROL SULFATE (5 MG/ML) 0.5% IN NEBU
2.5000 mg | INHALATION_SOLUTION | Freq: Once | RESPIRATORY_TRACT | Status: AC
  Administered 2013-01-17: 2.5 mg via RESPIRATORY_TRACT
  Filled 2013-01-17: qty 0.5

## 2013-01-17 MED ORDER — ACETAMINOPHEN 325 MG PO TABS: 650.0000 mg | ORAL_TABLET | Freq: Four times a day (QID) | ORAL | Status: DC | PRN

## 2013-01-17 MED ORDER — LACTATED RINGERS IV SOLN
INTRAVENOUS | Status: DC
Start: 2013-01-17 — End: 2013-01-19
  Administered 2013-01-17 – 2013-01-18 (×2): via INTRAVENOUS

## 2013-01-17 MED ORDER — HYDROMORPHONE HCL PF 1 MG/ML IJ SOLN
INTRAMUSCULAR | Status: DC | PRN
  Administered 2013-01-17: 0.5 mg via INTRAVENOUS
  Administered 2013-01-17: 2 mg via INTRAVENOUS
  Administered 2013-01-17: 0.5 mg via INTRAVENOUS

## 2013-01-17 MED ORDER — THROMBIN 5000 UNITS EX SOLR
CUTANEOUS | Status: DC | PRN
  Administered 2013-01-17 (×2): 5000 [IU] via TOPICAL

## 2013-01-17 MED ORDER — ALPRAZOLAM 0.25 MG PO TABS: 0.2500 mg | ORAL_TABLET | Freq: Three times a day (TID) | ORAL | Status: DC | PRN

## 2013-01-17 MED ORDER — CALCIUM CARBONATE 600 MG PO TABS: 600.0000 mg | ORAL_TABLET | Freq: Every day | ORAL | Status: DC

## 2013-01-17 MED ORDER — INSULIN ASPART 100 UNIT/ML ~~LOC~~ SOLN
0.0000 [IU] | Freq: Three times a day (TID) | SUBCUTANEOUS | Status: DC
  Administered 2013-01-17: 3 [IU] via SUBCUTANEOUS
  Administered 2013-01-18 (×2): 5 [IU] via SUBCUTANEOUS
  Administered 2013-01-18: 3 [IU] via SUBCUTANEOUS
  Administered 2013-01-19: 5 [IU] via SUBCUTANEOUS
  Administered 2013-01-19: 3 [IU] via SUBCUTANEOUS

## 2013-01-17 MED ORDER — MIDAZOLAM HCL 5 MG/5ML IJ SOLN
INTRAMUSCULAR | Status: DC | PRN
  Administered 2013-01-17: 2 mg via INTRAVENOUS

## 2013-01-17 MED ORDER — EPINEPHRINE 0.3 MG/0.3ML IJ SOAJ: 0.3000 mg | Freq: Once | INTRAMUSCULAR | Status: DC

## 2013-01-17 MED ORDER — FERROUS SULFATE 325 (65 FE) MG PO TABS
325.0000 mg | ORAL_TABLET | Freq: Three times a day (TID) | ORAL | Status: DC
  Administered 2013-01-18 – 2013-01-19 (×3): 325 mg via ORAL
  Filled 2013-01-17 (×7): qty 1

## 2013-01-17 MED ORDER — ALBUTEROL SULFATE HFA 108 (90 BASE) MCG/ACT IN AERS
2.0000 | INHALATION_SPRAY | RESPIRATORY_TRACT | Status: DC | PRN
  Administered 2013-01-18 (×3): 2 via RESPIRATORY_TRACT
  Filled 2013-01-17: qty 6.7

## 2013-01-17 MED ORDER — LACTATED RINGERS IV SOLN
INTRAVENOUS | Status: DC
  Administered 2013-01-17: 1000 mL via INTRAVENOUS

## 2013-01-17 MED ORDER — METHOCARBAMOL 100 MG/ML IJ SOLN
500.0000 mg | Freq: Four times a day (QID) | INTRAVENOUS | Status: DC | PRN
  Administered 2013-01-17: 500 mg via INTRAVENOUS
  Filled 2013-01-17 (×2): qty 5

## 2013-01-17 MED ORDER — ATORVASTATIN CALCIUM 40 MG PO TABS
40.0000 mg | ORAL_TABLET | Freq: Every day | ORAL | Status: DC
  Administered 2013-01-17 – 2013-01-18 (×2): 40 mg via ORAL
  Filled 2013-01-17 (×3): qty 1

## 2013-01-17 MED ORDER — LABETALOL HCL 5 MG/ML IV SOLN
INTRAVENOUS | Status: DC | PRN
  Administered 2013-01-17 (×3): 5 mg via INTRAVENOUS

## 2013-01-17 SURGICAL SUPPLY — 69 items
ADH SKN CLS APL DERMABOND .7 (GAUZE/BANDAGES/DRESSINGS) ×1
BAG SPEC THK2 15X12 ZIP CLS (MISCELLANEOUS) ×1
BAG ZIPLOCK 12X15 (MISCELLANEOUS) ×2 IMPLANT
BANDAGE ELASTIC 4 VELCRO ST LF (GAUZE/BANDAGES/DRESSINGS) ×2 IMPLANT
BANDAGE ELASTIC 6 VELCRO ST LF (GAUZE/BANDAGES/DRESSINGS) ×2 IMPLANT
BANDAGE ESMARK 6X9 LF (GAUZE/BANDAGES/DRESSINGS) ×1 IMPLANT
BLADE SAG 18X100X1.27 (BLADE) ×2 IMPLANT
BLADE SAW SGTL 11.0X1.19X90.0M (BLADE) ×2 IMPLANT
BNDG CMPR 9X6 STRL LF SNTH (GAUZE/BANDAGES/DRESSINGS) ×1
BNDG ESMARK 6X9 LF (GAUZE/BANDAGES/DRESSINGS) ×2
BONE CEMENT GENTAMICIN (Cement) ×4 IMPLANT
CAPT RP KNEE ×1 IMPLANT
CEMENT BONE GENTAMICIN 40 (Cement) ×2 IMPLANT
CLOTH BEACON ORANGE TIMEOUT ST (SAFETY) ×2 IMPLANT
CUFF TOURN SGL QUICK 34 (TOURNIQUET CUFF) ×2
CUFF TRNQT CYL 34X4X40X1 (TOURNIQUET CUFF) ×1 IMPLANT
DERMABOND ADVANCED (GAUZE/BANDAGES/DRESSINGS) ×1
DERMABOND ADVANCED .7 DNX12 (GAUZE/BANDAGES/DRESSINGS) ×1 IMPLANT
DRAPE EXTREMITY T 121X128X90 (DRAPE) ×2 IMPLANT
DRAPE INCISE IOBAN 66X45 STRL (DRAPES) ×2 IMPLANT
DRAPE LG THREE QUARTER DISP (DRAPES) ×2 IMPLANT
DRAPE POUCH INSTRU U-SHP 10X18 (DRAPES) ×2 IMPLANT
DRAPE U-SHAPE 47X51 STRL (DRAPES) ×2 IMPLANT
DRSG AQUACEL AG ADV 3.5X 6 (GAUZE/BANDAGES/DRESSINGS) IMPLANT
DRSG AQUACEL AG ADV 3.5X10 (GAUZE/BANDAGES/DRESSINGS) ×2 IMPLANT
DRSG PAD ABDOMINAL 8X10 ST (GAUZE/BANDAGES/DRESSINGS) ×8 IMPLANT
DRSG TEGADERM 4X4.75 (GAUZE/BANDAGES/DRESSINGS) ×2 IMPLANT
DURAPREP 26ML APPLICATOR (WOUND CARE) ×2 IMPLANT
ELECT REM PT RETURN 9FT ADLT (ELECTROSURGICAL) ×2
ELECTRODE REM PT RTRN 9FT ADLT (ELECTROSURGICAL) ×1 IMPLANT
EVACUATOR 1/8 PVC DRAIN (DRAIN) ×2 IMPLANT
GAUZE SPONGE 2X2 8PLY STRL LF (GAUZE/BANDAGES/DRESSINGS) ×1 IMPLANT
GLOVE BIOGEL PI IND STRL 8 (GLOVE) ×1 IMPLANT
GLOVE BIOGEL PI IND STRL 8.5 (GLOVE) IMPLANT
GLOVE BIOGEL PI INDICATOR 8 (GLOVE) ×1
GLOVE BIOGEL PI INDICATOR 8.5 (GLOVE)
GLOVE ECLIPSE 8.0 STRL XLNG CF (GLOVE) ×4 IMPLANT
GLOVE SURG SS PI 6.5 STRL IVOR (GLOVE) ×4 IMPLANT
GOWN PREVENTION PLUS LG XLONG (DISPOSABLE) ×4 IMPLANT
GOWN STRL REIN XL XLG (GOWN DISPOSABLE) ×2 IMPLANT
HANDPIECE INTERPULSE COAX TIP (DISPOSABLE) ×2
IMMOBILIZER KNEE 20 (SOFTGOODS) ×2
IMMOBILIZER KNEE 20 THIGH 36 (SOFTGOODS) IMPLANT
KIT BASIN OR (CUSTOM PROCEDURE TRAY) ×2 IMPLANT
MANIFOLD NEPTUNE II (INSTRUMENTS) ×2 IMPLANT
NEEDLE HYPO 22GX1.5 SAFETY (NEEDLE) ×2 IMPLANT
NS IRRIG 1000ML POUR BTL (IV SOLUTION) ×2 IMPLANT
PACK TOTAL JOINT (CUSTOM PROCEDURE TRAY) ×2 IMPLANT
PADDING CAST COTTON 6X4 STRL (CAST SUPPLIES) ×2 IMPLANT
POSITIONER SURGICAL ARM (MISCELLANEOUS) ×2 IMPLANT
SET HNDPC FAN SPRY TIP SCT (DISPOSABLE) ×1 IMPLANT
SPONGE GAUZE 2X2 STER 10/PKG (GAUZE/BANDAGES/DRESSINGS) ×1
SPONGE LAP 18X18 X RAY DECT (DISPOSABLE) ×2 IMPLANT
SPONGE SURGIFOAM ABS GEL 100 (HEMOSTASIS) ×2 IMPLANT
STAPLER VISISTAT 35W (STAPLE) IMPLANT
SUCTION FRAZIER 12FR DISP (SUCTIONS) ×1 IMPLANT
SUT BONE WAX W31G (SUTURE) ×2 IMPLANT
SUT MNCRL AB 4-0 PS2 18 (SUTURE) ×2 IMPLANT
SUT VIC AB 1 CT1 27 (SUTURE) ×4
SUT VIC AB 1 CT1 27XBRD ANTBC (SUTURE) ×2 IMPLANT
SUT VIC AB 2-0 CT1 27 (SUTURE) ×8
SUT VIC AB 2-0 CT1 TAPERPNT 27 (SUTURE) ×2 IMPLANT
SUT VLOC 180 0 24IN GS25 (SUTURE) ×2 IMPLANT
SYR 20CC LL (SYRINGE) ×2 IMPLANT
TOWEL OR 17X26 10 PK STRL BLUE (TOWEL DISPOSABLE) ×4 IMPLANT
TOWER CARTRIDGE SMART MIX (DISPOSABLE) ×2 IMPLANT
TRAY FOLEY CATH 14FRSI W/METER (CATHETERS) ×2 IMPLANT
WATER STERILE IRR 1500ML POUR (IV SOLUTION) ×2 IMPLANT
WRAP KNEE MAXI GEL POST OP (GAUZE/BANDAGES/DRESSINGS) ×4 IMPLANT

## 2013-01-17 NOTE — Anesthesia Preprocedure Evaluation (Addendum)
Anesthesia Evaluation    Airway Mallampati: II TM Distance: >3 FB Neck ROM: Full    Dental  (+) Dental Advisory Given   Pulmonary shortness of breath, asthma , COPD COPD inhaler, Current Smoker,  breath sounds clear to auscultation        Cardiovascular hypertension, Pt. on medications + Past MI (2006) + dysrhythmias + Valvular Problems/Murmurs Rhythm:Regular Rate:Normal     Neuro/Psych CVA (R sided weakness), Residual Symptoms    GI/Hepatic hiatal hernia, GERD-  ,  Endo/Other  diabetes, Type 2  Renal/GU      Musculoskeletal   Abdominal   Peds  Hematology   Anesthesia Other Findings   Reproductive/Obstetrics                        Anesthesia Physical Anesthesia Plan  ASA: III  Anesthesia Plan: Spinal   Post-op Pain Management:    Induction:   Airway Management Planned: Simple Face Mask and Nasal Cannula  Additional Equipment:   Intra-op Plan:   Post-operative Plan:   Informed Consent: I have reviewed the patients History and Physical, chart, labs and discussed the procedure including the risks, benefits and alternatives for the proposed anesthesia with the patient or authorized representative who has indicated his/her understanding and acceptance.   Dental advisory given  Plan Discussed with: CRNA  Anesthesia Plan Comments:         Anesthesia Quick Evaluation

## 2013-01-17 NOTE — Preoperative (Signed)
Beta Blockers   Reason not to administer Beta Blockers:Not Applicable 

## 2013-01-17 NOTE — Interval H&P Note (Signed)
History and Physical Interval Note:  01/17/2013 1:25 PM  [name] [name]  has presented today for surgery, with the diagnosis of OA LEFT KNEE   The various methods of treatment have been discussed with the patient and family. After consideration of risks, benefits and other options for treatment, the patient has consented to  Procedure(s): LEFT TOTAL KNEE ARTHROPLASTY (Left) as a surgical intervention .  The patient's history has been reviewed, patient examined, no change in status, stable for surgery.  I have reviewed the patient's chart and labs.  Questions were answered to the patient's satisfaction.     [name] A

## 2013-01-17 NOTE — Anesthesia Postprocedure Evaluation (Signed)
Anesthesia Post Note  Patient: [name] [name]  Procedure(s) Performed: Procedure(s) (LRB): LEFT TOTAL KNEE ARTHROPLASTY (Left)  Anesthesia type: General  Patient location: PACU  Post pain: Pain level controlled  Post assessment: Post-op Vital signs reviewed  Last Vitals: BP 129/66  Pulse 66  Temp(Src) 36.4 C (Oral)  Resp 14  SpO2 94%  Post vital signs: Reviewed  Level of consciousness: sedated  Complications: No apparent anesthesia complications

## 2013-01-17 NOTE — Anesthesia Procedure Notes (Signed)
Spinal  Patient location during procedure: OR Start time: 01/17/2013 1:38 PM End time: 01/17/2013 1:43 PM Staffing Anesthesiologist: Lewie Loron R Performed by: anesthesiologist  Preanesthetic Checklist Completed: patient identified, site marked, surgical consent, pre-op evaluation, timeout performed, IV checked, risks and benefits discussed and monitors and equipment checked Spinal Block Patient position: sitting Prep: ChloraPrep Patient monitoring: heart rate, continuous pulse ox and blood pressure Approach: midline Location: L4-5 Injection technique: single-shot Needle Needle type: Quincke  Needle gauge: 22 G Needle length: 9 cm Additional Notes Expiration date of kit checked and confirmed. Patient tolerated procedure well, without complications.

## 2013-01-17 NOTE — Transfer of Care (Signed)
Immediate Anesthesia Transfer of Care Note  Patient: [name] [name]  Procedure(s) Performed: Procedure(s): LEFT TOTAL KNEE ARTHROPLASTY (Left)  Patient Location: PACU  Anesthesia Type:General and Spinal  Level of Consciousness: awake, alert , oriented and patient cooperative  Airway & Oxygen Therapy: Patient Spontanous Breathing and Patient connected to face mask oxygen  Post-op Assessment: Report given to PACU RN, Post -op Vital signs reviewed and stable and Patient moving all extremities X 4  Post vital signs: Reviewed and stable  Complications: No apparent anesthesia complications

## 2013-01-17 NOTE — Brief Op Note (Signed)
01/17/2013  3:38 PM  PATIENT:  [name] [name]  62 y.o. female  PRE-OPERATIVE DIAGNOSIS:  OA LEFT KNEE   POST-OPERATIVE DIAGNOSIS:  OA LEFT KNEE   PROCEDURE:  Procedure(s): LEFT TOTAL KNEE ARTHROPLASTY (Left)  SURGEON:  Surgeon(s) and Role:    * Jacki Cones, MD - Primary  PHYSICIAN ASSISTANT: Dimitri Ped PA  ASSISTANTS: Dimitri Ped PA   ANESTHESIA:   spinal and general  EBL:  Total I/O In: 1000 [I.V.:1000] Out: -   BLOOD ADMINISTERED:none  DRAINS: (one) Hemovact drain(s) in the Left Knee with  Suction Open   LOCAL MEDICATIONS USED:  BUPIVICAINE20cc mixed with 20cc of Normal Saline   SPECIMEN:  No Specimen  DISPOSITION OF SPECIMEN:  N/A  COUNTS:  YES  TOURNIQUET:  * Missing tourniquet times found for documented tourniquets in log:  [id number] *  DICTATION: .Other Dictation: Dictation Number [id number]  PLAN OF CARE: Admit to inpatient   PATIENT DISPOSITION:  stable in OR   Delay start of Pharmacological VTE agent (>24hrs) due to surgical blood loss or risk of bleeding: yes

## 2013-01-18 ENCOUNTER — Encounter (HOSPITAL_COMMUNITY): Payer: Self-pay | Admitting: Orthopedic Surgery

## 2013-01-18 DIAGNOSIS — D62 Acute posthemorrhagic anemia: Secondary | ICD-10-CM | POA: Diagnosis not present

## 2013-01-18 LAB — BASIC METABOLIC PANEL
BUN: 10 mg/dL (ref 6–23)
CO2: 23 mEq/L (ref 19–32)
Chloride: 99 mEq/L (ref 96–112)
Creatinine, Ser: 0.56 mg/dL (ref 0.50–1.10)
Glucose, Bld: 248 mg/dL — ABNORMAL HIGH (ref 70–99)

## 2013-01-18 LAB — CBC
HCT: 28.3 % — ABNORMAL LOW (ref 36.0–46.0)
Hemoglobin: 9.3 g/dL — ABNORMAL LOW (ref 12.0–15.0)
MCH: 27.4 pg (ref 26.0–34.0)
MCV: 83.2 fL (ref 78.0–100.0)
RBC: 3.4 MIL/uL — ABNORMAL LOW (ref 3.87–5.11)
WBC: 10.6 10*3/uL — ABNORMAL HIGH (ref 4.0–10.5)

## 2013-01-18 LAB — GLUCOSE, CAPILLARY
Glucose-Capillary: 210 mg/dL — ABNORMAL HIGH (ref 70–99)
Glucose-Capillary: 220 mg/dL — ABNORMAL HIGH (ref 70–99)

## 2013-01-18 MED ORDER — ALBUTEROL SULFATE HFA 108 (90 BASE) MCG/ACT IN AERS
2.0000 | INHALATION_SPRAY | Freq: Four times a day (QID) | RESPIRATORY_TRACT | Status: DC
  Administered 2013-01-18 – 2013-01-19 (×2): 2 via RESPIRATORY_TRACT

## 2013-01-18 MED ORDER — ALBUTEROL SULFATE (5 MG/ML) 0.5% IN NEBU: 2.5000 mg | INHALATION_SOLUTION | RESPIRATORY_TRACT | Status: DC | PRN

## 2013-01-18 NOTE — Op Note (Signed)
[name] [name], LEVITAN NO.:  [id number]  MEDICAL RECORD NO.:  [id number]  LOCATION:  1603                         FACILITY:  [facility]  PHYSICIAN:  Georges Lynch. Blondie Riggsbee, M.D.DATE OF BIRTH:  Nov 14, 1950  DATE OF PROCEDURE:  01/17/2013 DATE OF DISCHARGE:                              OPERATIVE REPORT   SURGEON:  Georges Lynch. Darrelyn Hillock, M.D.  ASSISTANT:  Dimitri Ped, [place].  PREOPERATIVE DIAGNOSIS:  Severe degenerative arthritis of the left knee with a flexion contracture.  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  OPERATION:  Left total knee arthroplasty utilizing DePuy system.  I cemented all three components.  The gentamicin was used in the cement. Sizes used was a size 3 left posterior cruciate sacrificing femoral component, tibial tray was a 2.5 mm, the tibial insert was a size 3 12.5- mm thickness rotating platform.  The patella was a size 32 with 3 pegs. As I said, all three components were cemented.  PROCEDURE:  First, under spinal anesthesia.  Since this spinal anesthesia was very light, we decided to go ahead and give her general anesthetic.  A routine orthopedic prepping and draping of the left lower extremity was carried out.  The leg was exsanguinated with an Esmarch. Tourniquet was elevated at 325 mmHg.  The appropriate time-out was first carried out though.  I also marked the appropriate left leg in the holding area.  At this time, the knee was flexed and incision was made on the anterior aspect of the left knee.  Bleeders were identified and cauterized.  This particular time, a median parapatellar incision.  The left knee was carried out.  Bleeders were identified and cauterized. Following that, I did anterior and posterior cruciate excisions, also excised the medial and lateral menisci.  Thoroughly debrided the knee, then a synovectomy as well.  At this time, the knee was flexed.  The drill hole  was made in the intercondylar notch in usual fashion.  The canal finder then was inserted.  I thoroughly irrigated the femoral canal after removing 12 mm thickness off the distal femur.  We measured the femur to be a size 3.  I then inserted my next jig and did anterior, posterior and chamfering cuts for size 3 femoral component.  Following that, I went down and prepared the tibia in usual fashion.  We removed approximately 8 mm thickness off the affected medial side of the tibia. We then checked and removed the spurs in the posterior condyle. Following that, I inserted my spacer guide blocks and we had good function in flexion and extension.  I then removed the spacer blocks.  I then continued to prepare the tibia.  I cut my keel cut out of the tibia in usual fashion.  Following that, we then did a notch cut out of the distal femur.  Thoroughly irrigated out the area and then inserted my trial components.  We utilized the size 2.5 tray 3-mm and a size 3 femoral component.  I reduced the knee with a 12.5-mm thickness insert. I then did a resurfacing procedure on the patella for a size 32 patella. Three drill holes were made in  the articular surface of the patella.  I then removed all trial components, water picked the knee out, cemented all three permanent components in the usual fashion.  After the cement was dried, I removed all loose piece of cement.  I then water picked the knee out to make sure we removed all the pieces of cement.  Following that, I then removed my tibial insert and I injected a mixture of 20 mL of Exparel and 20 mL of normal saline.  I used half of that mixture in the deep structures.  Great care was taken to avoid the vessels and nerves.  I then packed some thrombin-soaked Gelfoam loosely in the posterior compartment region and then inserted my permanent rotating platform 12.5 mm thickness size 3, reduced the knee.  We had a nice flexion and extension and good  medial and lateral stability.  At this time, I then inserted the Hemovac drain, closed the knee in layers in usual fashion.  The wound was closed over Hemovac drain.  Sterile dressings were applied.          ______________________________ Georges Lynch Darrelyn Hillock, M.D.     RAG/MEDQ  D:  01/17/2013  T:  01/18/2013  Job:  [id number]

## 2013-01-18 NOTE — Progress Notes (Signed)
Physical Therapy Treatment Patient Details Name: [name] [name] MRN: [id number] DOB: 02-21-1951 Today's Date: 01/18/2013 Time: 1421-1440 PT Time Calculation (min): 19 min  PT Assessment / Plan / Recommendation Comments on Treatment Session       Follow Up Recommendations  Home health PT     Does the patient have the potential to tolerate intense rehabilitation     Barriers to Discharge        Equipment Recommendations  Rolling walker with 5" wheels    Recommendations for Other Services OT consult  Frequency 7X/week   Plan Discharge plan remains appropriate    Precautions / Restrictions Precautions Precautions: Knee;Fall Required Braces or Orthoses: Knee Immobilizer - Left Knee Immobilizer - Left: Discontinue once straight leg raise with < 10 degree lag Restrictions Weight Bearing Restrictions: No Other Position/Activity Restrictions: WBAT   Pertinent Vitals/Pain 7/10; premedicated, RN aware    Mobility  Transfers Transfers: Sit to Stand;Stand to Sit Sit to Stand: 4: Min assist Stand to Sit: 4: Min assist Details for Transfer Assistance: cues for LE management and use of UEs to self assist Ambulation/Gait Ambulation/Gait Assistance: 4: Min assist Ambulation Distance (Feet): 55 Feet (twice) Assistive device: Rolling walker Ambulation/Gait Assistance Details: cues for sequence, stride length, position from RW and posture Gait Pattern: Step-to pattern;Decreased stance time - left Stairs: No    Exercises     PT Diagnosis:    PT Problem List:   PT Treatment Interventions:     PT Goals Acute Rehab PT Goals PT Goal Formulation: With patient Time For Goal Achievement: 01/24/13 Potential to Achieve Goals: Good Pt will go Supine/Side to Sit: with supervision PT Goal: Supine/Side to Sit - Progress: Goal set today Pt will go Sit to Supine/Side: with supervision PT Goal: Sit to Supine/Side - Progress: Goal set today Pt will go Sit to Stand: with supervision PT  Goal: Sit to Stand - Progress: Goal set today Pt will go Stand to Sit: with supervision PT Goal: Stand to Sit - Progress: Goal set today Pt will Ambulate: 51 - 150 feet;with supervision;with rolling walker PT Goal: Ambulate - Progress: Goal set today Pt will Go Up / Down Stairs: 3-5 stairs;with min assist;with least restrictive assistive device PT Goal: Up/Down Stairs - Progress: Goal set today  Visit Information  Last PT Received On: 01/18/13 Assistance Needed: +1    Subjective Data  Subjective: Its hurting more than this morning Patient Stated Goal: Resume previous lifestyle with decreased pain   Cognition  Cognition Arousal/Alertness: Awake/alert Behavior During Therapy: WFL for tasks assessed/performed Overall Cognitive Status: Within Functional Limits for tasks assessed    Balance     End of Session PT - End of Session Equipment Utilized During Treatment: Gait belt;Left knee immobilizer Activity Tolerance: Patient tolerated treatment well Patient left: Other (comment) (in bathroom with RN) Nurse Communication: Mobility status   GP     Hayden Kihara 01/18/2013, 4:24 PM

## 2013-01-18 NOTE — Care Management Note (Signed)
  Page 2 of 2   01/18/2013     3:56:43 PM   CARE MANAGEMENT NOTE 01/18/2013  Patient:  [name], [name]   Account Number:  [id number]  Date Initiated:  01/18/2013  Documentation initiated by:  Colleen Can  Subjective/Objective Assessment:   dx total left knee replacemnt     Action/Plan:   CM spoke with patient. Plans are for patient to return to her mother's home in [place] where her brother and mother will be caregivers. States she will need RW and possibly commode seat.   Anticipated DC Date:  01/20/2013   Anticipated DC Plan:  HOME W HOME HEALTH SERVICES      DC Planning Services  CM consult      PAC Choice  DURABLE MEDICAL EQUIPMENT  HOME HEALTH   Choice offered to / List presented to:  C-1 Patient        HH arranged  HH-2 PT      [facility] agency  [facility]   Status of service:  In process, will continue to follow Medicare Important Message given?   (If response is "NO", the following Medicare IM given date fields will be blank) Date Medicare IM given:   Date Additional Medicare IM given:    Discharge Disposition:    Per UR Regulation:  Reviewed for med. necessity/level of care/duration of stay  If discussed at Long Length of Stay Meetings, dates discussed:    Comments:  01/18/2013 Colleen Can BSN RN CCM [phone] [facility] rep called to request HHpt services. Advised by rep that [facility] can provide HHpt services with start date of day after discharge. [facility] dme rep notified of dme needs.

## 2013-01-18 NOTE — Progress Notes (Signed)
Subjective: 1 Day Post-Op Procedure(s) (LRB): LEFT TOTAL KNEE ARTHROPLASTY (Left) Patient reports pain as 5 on 0-10 scale.Hemovac Dcd. She is doing well and should be ready for DC Saturday.    Objective: Vital signs in last 24 hours: Temp:  [97.4 F (36.3 C)-97.7 F (36.5 C)] 97.7 F (36.5 C) (06/20 0501) Pulse Rate:  [54-84] 70 (06/20 0501) Resp:  [11-18] 16 (06/20 0501) BP: (121-173)/(66-81) 150/71 mmHg (06/20 0501) SpO2:  [92 %-98 %] 98 % (06/20 0501) Weight:  [78.642 kg (173 lb 6 oz)] 78.642 kg (173 lb 6 oz) (06/19 1727)  Intake/Output from previous day: 06/19 0701 - 06/20 0700 In: 3490 [P.O.:720; I.V.:2670; IV Piggyback:100] Out: 9604 [VWUJW:1191; Drains:337] Intake/Output this shift:     Recent Labs  01/18/13 0443  HGB 9.3*    Recent Labs  01/18/13 0443  WBC 10.6*  RBC 3.40*  HCT 28.3*  PLT 187    Recent Labs  01/18/13 0443  NA 136  K 3.4*  CL 99  CO2 23  BUN 10  CREATININE 0.56  GLUCOSE 248*  CALCIUM 8.6   No results found for this basename: LABPT, INR,  in the last 72 hours  Neurologically intact Neurovascular intact Dorsiflexion/Plantar flexion intact  Assessment/Plan: 1 Day Post-Op Procedure(s) (LRB): LEFT TOTAL KNEE ARTHROPLASTY (Left) Up with therapy discontinued plans for Saturday.  Bradley Handyside A 01/18/2013, 7:22 AM

## 2013-01-18 NOTE — Evaluation (Signed)
Physical Therapy Evaluation Patient Details Name: [name] [name] MRN: [id number] DOB: March 12, 1951 Today's Date: 01/18/2013 Time: 6578-4696 PT Time Calculation (min): 39 min  PT Assessment / Plan / Recommendation Clinical Impression  Pt s/p L TKR presents with decreased L LE strength/ROM and post op pain limiting functional mobility    PT Assessment  Patient needs continued PT services    Follow Up Recommendations  Home health PT    Does the patient have the potential to tolerate intense rehabilitation      Barriers to Discharge None      Equipment Recommendations  Rolling walker with 5" wheels (Pt is 5'1")    Recommendations for Other Services OT consult   Frequency 7X/week    Precautions / Restrictions Precautions Precautions: Knee;Fall Restrictions Weight Bearing Restrictions: No Other Position/Activity Restrictions: WBAT   Pertinent Vitals/Pain 3-4/10, premed, cold packs provided      Mobility  Bed Mobility Bed Mobility: Supine to Sit Supine to Sit: 4: Min assist Details for Bed Mobility Assistance: cues for sequence and use of R LE to self assist Transfers Transfers: Sit to Stand;Stand to Sit Sit to Stand: 4: Min assist Stand to Sit: 4: Min assist Details for Transfer Assistance: cues for LE management and use of UEs to self assist Ambulation/Gait Ambulation/Gait Assistance: 4: Min assist Ambulation Distance (Feet): 65 Feet Assistive device: Rolling walker Ambulation/Gait Assistance Details: cues for sequence, posture and position from RW Gait Pattern: Step-to pattern;Decreased stance time - left Stairs: No    Exercises Total Joint Exercises Ankle Circles/Pumps: AROM;20 reps;Both;Supine Quad Sets: AROM;10 reps;Supine;Both Heel Slides: AAROM;10 reps;Supine;Left Straight Leg Raises: AROM;AAROM;10 reps;Supine;Left   PT Diagnosis: Difficulty walking  PT Problem List: Decreased strength;Decreased range of motion;Decreased activity tolerance;Decreased  mobility;Decreased knowledge of use of DME;Pain;Decreased knowledge of precautions PT Treatment Interventions: DME instruction;Gait training;Stair training;Functional mobility training;Therapeutic activities;Therapeutic exercise;Patient/family education   PT Goals Acute Rehab PT Goals PT Goal Formulation: With patient Time For Goal Achievement: 01/24/13 Potential to Achieve Goals: Good Pt will go Supine/Side to Sit: with supervision PT Goal: Supine/Side to Sit - Progress: Goal set today Pt will go Sit to Supine/Side: with supervision PT Goal: Sit to Supine/Side - Progress: Goal set today Pt will go Sit to Stand: with supervision PT Goal: Sit to Stand - Progress: Goal set today Pt will go Stand to Sit: with supervision PT Goal: Stand to Sit - Progress: Goal set today Pt will Ambulate: 51 - 150 feet;with supervision;with rolling walker PT Goal: Ambulate - Progress: Goal set today Pt will Go Up / Down Stairs: 3-5 stairs;with min assist;with least restrictive assistive device PT Goal: Up/Down Stairs - Progress: Goal set today  Visit Information  Last PT Received On: 01/18/13 Assistance Needed: +1    Subjective Data  Subjective: I am ready to get up Patient Stated Goal: Resume previous lifestyle with decreased pain   Prior Functioning  Home Living Lives With: Alone Available Help at Discharge: Family Type of Home: House Home Access: Stairs to enter Secretary/administrator of Steps: 3 Entrance Stairs-Rails: Right Home Layout: One level Home Adaptive Equipment: Straight cane Additional Comments: Pt will be staying with mother and brother Prior Function Level of Independence: Independent;Independent with assistive device(s) Able to Take Stairs?: Yes Driving: Yes Communication Communication: No difficulties Dominant Hand: Right    Cognition  Cognition Arousal/Alertness: Awake/alert Behavior During Therapy: WFL for tasks assessed/performed Overall Cognitive Status: Within  Functional Limits for tasks assessed    Extremity/Trunk Assessment Right Upper Extremity Assessment RUE ROM/Strength/Tone:  WFL for tasks assessed Left Upper Extremity Assessment LUE ROM/Strength/Tone: WFL for tasks assessed Right Lower Extremity Assessment RLE ROM/Strength/Tone: [facility] for tasks assessed Left Lower Extremity Assessment LLE ROM/Strength/Tone: Deficits LLE ROM/Strength/Tone Deficits: 3-/5 quads with AAROM at knee -10 - 55 Trunk Assessment Trunk Assessment: Normal   Balance    End of Session PT - End of Session Equipment Utilized During Treatment: Gait belt;Left knee immobilizer Activity Tolerance: Patient tolerated treatment well Patient left: in chair;with call bell/phone within reach Nurse Communication: Mobility status  GP     Demika Langenderfer 01/18/2013, 1:28 PM

## 2013-01-18 NOTE — Progress Notes (Signed)
OT Cancellation Note  Patient Details Name: DECKLYN HORNIK MRN: [id number] DOB: 1951/02/22   Cancelled Treatment:    Reason Eval/Treat Not Completed: Other (comment) (Attempted eval. Pt vomitting when OT arrived. Staff in assisting. Will reattempt tomorrow. )  Lennox Laity [phone] 01/18/2013, 3:49 PM

## 2013-01-19 LAB — CBC
Hemoglobin: 9 g/dL — ABNORMAL LOW (ref 12.0–15.0)
MCHC: 34.6 g/dL (ref 30.0–36.0)
RBC: 3.14 MIL/uL — ABNORMAL LOW (ref 3.87–5.11)

## 2013-01-19 LAB — GLUCOSE, CAPILLARY

## 2013-01-19 LAB — BASIC METABOLIC PANEL
GFR calc non Af Amer: >90 mL/min (ref >90)
Glucose, Bld: 180 mg/dL — ABNORMAL HIGH (ref 70–99)
Potassium: 3.3 mEq/L — ABNORMAL LOW (ref 3.5–5.1)
Sodium: 136 mEq/L (ref 135–145)

## 2013-01-19 MED ORDER — RIVAROXABAN 10 MG PO TABS: 10.0000 mg | ORAL_TABLET | Freq: Every day | ORAL | Status: DC

## 2013-01-19 MED ORDER — METHOCARBAMOL 500 MG PO TABS: 500.0000 mg | ORAL_TABLET | Freq: Four times a day (QID) | ORAL | Status: DC | PRN

## 2013-01-19 MED ORDER — HYDROMORPHONE HCL 2 MG PO TABS: 2.0000 mg | ORAL_TABLET | ORAL | Status: DC | PRN

## 2013-01-19 NOTE — Evaluation (Signed)
Occupational Therapy Evaluation Patient Details Name: [name] [name] MRN: [id number] DOB: August 01, 1951 Today's Date: 01/19/2013 Time: 4098-1191 OT Time Calculation (min): 17 min  OT Assessment / Plan / Recommendation Clinical Impression  Pt presents to OT s.p TKR. Pt educated on ADL activity s.p TKR.  All edcuation complete    OT Assessment  Patient does not need any further OT services    Follow Up Recommendations  No OT follow up                Precautions / Restrictions Precautions Precautions: Knee;Fall Required Braces or Orthoses: Knee Immobilizer - Left Knee Immobilizer - Left: Discontinue once straight leg raise with < 10 degree lag Restrictions Weight Bearing Restrictions: No Other Position/Activity Restrictions: WBAT       ADL  Grooming: Performed;Wash/dry face;Supervision/safety Where Assessed - Grooming: Unsupported standing Lower Body Dressing: Performed Where Assessed - Lower Body Dressing: Unsupported sit to stand Toilet Transfer: Performed;Supervision/safety Toilet Transfer Method: Sit to Barista: Regular height toilet Toileting - Clothing Manipulation and Hygiene: Performed;Supervision/safety Where Assessed - Toileting Clothing Manipulation and Hygiene: Standing Transfers/Ambulation Related to ADLs: Pts  mom will A as needed        Visit Information  Last OT Received On: 01/19/13    Subjective Data  Subjective: I am going home today. Mom will help me as I need   Prior Functioning     Home Living Lives With: Alone Available Help at Discharge: Family Type of Home: House Home Access: Stairs to enter Secretary/administrator of Steps: 3 Entrance Stairs-Rails: Right Home Layout: One level Home Adaptive Equipment: Straight cane Additional Comments: Pt will be staying with mother and brother Prior Function Level of Independence: Independent;Independent with assistive device(s) Able to Take Stairs?: Yes Driving:  Yes Communication Communication: No difficulties Dominant Hand: Right         Vision/Perception Vision - History Patient Visual Report: No change from baseline   Cognition  Cognition Arousal/Alertness: Awake/alert Behavior During Therapy: WFL for tasks assessed/performed Overall Cognitive Status: Within Functional Limits for tasks assessed    Extremity/Trunk Assessment Right Upper Extremity Assessment RUE ROM/Strength/Tone: [facility] for tasks assessed Left Upper Extremity Assessment LUE ROM/Strength/Tone: WFL for tasks assessed     Mobility Transfers Transfers: Sit to Stand;Stand to Sit Sit to Stand: 5: Supervision Stand to Sit: 5: Supervision           End of Session OT - End of Session Equipment Utilized During Treatment: Left knee immobilizer Activity Tolerance: Patient tolerated treatment well       [name] [name], [name] [name] 01/19/2013, 10:09 AM

## 2013-01-19 NOTE — Progress Notes (Signed)
Pt stable, scripts, d/c instructions, and equipment given with no questions/concerns voiced by pt or family.  Pt transported via wheelchair to private vehicle with NT and family.

## 2013-01-19 NOTE — Progress Notes (Signed)
Subjective: 2 Days Post-Op Procedure(s) (LRB): LEFT TOTAL KNEE ARTHROPLASTY (Left) Patient reports pain as 3 on 0-10 scale.    Objective: Vital signs in last 24 hours: Temp:  [98.3 F (36.8 C)-99.4 F (37.4 C)] 99 F (37.2 C) (06/21 0530) Pulse Rate:  [60-84] 84 (06/21 0530) Resp:  [16-18] 16 (06/21 0530) BP: (131-157)/(51-73) 146/70 mmHg (06/21 0530) SpO2:  [92 %-98 %] 92 % (06/21 0530)  Intake/Output from previous day: 06/20 0701 - 06/21 0700 In: 1980 [P.O.:720; I.V.:1260] Out: 3850 [Urine:3850] Intake/Output this shift:     Recent Labs  01/18/13 0443 01/19/13 0455  HGB 9.3* 9.0*    Recent Labs  01/18/13 0443 01/19/13 0455  WBC 10.6* 8.0  RBC 3.40* 3.14*  HCT 28.3* 26.0*  PLT 187 180    Recent Labs  01/18/13 0443 01/19/13 0455  NA 136 136  K 3.4* 3.3*  CL 99 101  CO2 23 27  BUN 10 6  CREATININE 0.56 0.54  GLUCOSE 248* 180*  CALCIUM 8.6 8.7   No results found for this basename: LABPT, INR,  in the last 72 hours  Neurologically intact Sensation intact distally Intact pulses distally Incision: no drainage  Assessment/Plan: 2 Days Post-Op Procedure(s) (LRB): LEFT TOTAL KNEE ARTHROPLASTY (Left) Advance diet Discharge home with home health DC instructions given  Ardyn Forge C 01/19/2013, 8:16 AM

## 2013-01-19 NOTE — Discharge Summary (Signed)
Physician Discharge Summary   Patient ID: [name] [name] MRN: [id number] DOB/AGE: 1950/08/16 62 y.o.  Admit date: 01/17/2013 Discharge date: 01/19/2013  Primary Diagnosis: left knee DJD  Admission Diagnoses:  Past Medical History  Diagnosis Date  . Insomnia, unspecified   . Esophageal reflux   . Hyperlipemia   . Asthma   . Osteoporosis   . Arthritis   . COPD (chronic obstructive pulmonary disease)   . Depression   . Personal history of colonic polyps 09/2010    TUBULAR ADENOMAS (X3); NEGATIVE FOR HIGH GRADE DYSPLASIA OR MALIGNANCY.  . Barrett esophagus   . Hiatal hernia   . Esophageal stricture   . GERD (gastroesophageal reflux disease)   . GAD (generalized anxiety disorder)   . Aortic valve disorders   . Dysrhythmia     heart skips per pt   . Heart murmur   . Anxiety   . Shortness of breath     with exertion   . Pneumonia     hx of   . Type II or unspecified type diabetes mellitus without mention of complication, not stated as uncontrolled     on no meds   . Anemia     hx of years ago   . Hypertension 12/07/2012  . Myocardial infarction 01-08-13    '06-Chest pain-no stent-dx. MI-stress related  . 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 History of kidney stones 01-08-13    past hx.  . Transfusion history 01-08-13    past hx. many yrs ago   Discharge Diagnoses:   Active Problems:   Osteoarthritis of left knee   Postoperative anemia due to acute blood loss  Estimated body mass index is 32.78 kg/(m^2) as calculated from the following:   Height as of this encounter: 5\' 1"  (1.549 m).   Weight as of this encounter: 78.642 kg (173 lb 6 oz).  Procedure:  Procedure(s) (LRB): LEFT TOTAL KNEE ARTHROPLASTY (Left)   Consults: None  HPI: see H&P Laboratory Data: Admission on 01/17/2013  Component Date Value Range Status  . Glucose-Capillary 01/17/2013 142* 70 - 99 mg/dL Final  . Glucose-Capillary 01/17/2013 200* 70 - 99 mg/dL Final  . WBC  78/46/9629 10.6* 4.0 - 10.5 K/uL Final  . RBC 01/18/2013 3.40* 3.87 - 5.11 MIL/uL Final  . Hemoglobin 01/18/2013 9.3* 12.0 - 15.0 g/dL Final  . HCT 52/84/1324 28.3* 36.0 - 46.0 % Final  . MCV 01/18/2013 83.2  78.0 - 100.0 fL Final  . MCH 01/18/2013 27.4  26.0 - 34.0 pg Final  . MCHC 01/18/2013 32.9  30.0 - 36.0 g/dL Final  . RDW 40/05/2724 14.2  11.5 - 15.5 % Final  . Platelets 01/18/2013 187  150 - 400 K/uL Final  . Sodium 01/18/2013 136  135 - 145 mEq/L Final  . Potassium 01/18/2013 3.4* 3.5 - 5.1 mEq/L Final  . Chloride 01/18/2013 99  96 - 112 mEq/L Final  . CO2 01/18/2013 23  19 - 32 mEq/L Final  . Glucose, Bld 01/18/2013 248* 70 - 99 mg/dL Final  . BUN 36/64/4034 10  6 - 23 mg/dL Final  . Creatinine, Ser 01/18/2013 0.56  0.50 - 1.10 mg/dL Final  . Calcium 74/25/9563 8.6  8.4 - 10.5 mg/dL Final  . GFR calc non Af Amer 01/18/2013 >90  >90 mL/min Final  . GFR calc Af Amer 01/18/2013 >90  >90 mL/min Final   Comment:  The eGFR has been calculated                          using the CKD EPI equation.                          This calculation has not been                          validated in all clinical                          situations.                          eGFR's persistently                          <90 mL/min signify                          possible Chronic Kidney Disease.  . Glucose-Capillary 01/18/2013 210* 70 - 99 mg/dL Final  . Glucose-Capillary 01/18/2013 158* 70 - 99 mg/dL Final  . Glucose-Capillary 01/18/2013 220* 70 - 99 mg/dL Final  . WBC 81/19/1478 8.0  4.0 - 10.5 K/uL Final  . RBC 01/19/2013 3.14* 3.87 - 5.11 MIL/uL Final  . Hemoglobin 01/19/2013 9.0* 12.0 - 15.0 g/dL Final  . HCT 29/56/2130 26.0* 36.0 - 46.0 % Final  . MCV 01/19/2013 82.8  78.0 - 100.0 fL Final  . MCH 01/19/2013 28.7  26.0 - 34.0 pg Final  . MCHC 01/19/2013 34.6  30.0 - 36.0 g/dL Final  . RDW 86/57/8469 14.7  11.5 - 15.5 % Final  . Platelets 01/19/2013 180   150 - 400 K/uL Final  . Sodium 01/19/2013 136  135 - 145 mEq/L Final  . Potassium 01/19/2013 3.3* 3.5 - 5.1 mEq/L Final  . Chloride 01/19/2013 101  96 - 112 mEq/L Final  . CO2 01/19/2013 27  19 - 32 mEq/L Final  . Glucose, Bld 01/19/2013 180* 70 - 99 mg/dL Final  . BUN 62/95/2841 6  6 - 23 mg/dL Final  . Creatinine, Ser 01/19/2013 0.54  0.50 - 1.10 mg/dL Final  . Calcium 32/44/0102 8.7  8.4 - 10.5 mg/dL Final  . GFR calc non Af Amer 01/19/2013 >90  >90 mL/min Final  . GFR calc Af Amer 01/19/2013 >90  >90 mL/min Final   Comment:                                 The eGFR has been calculated                          using the CKD EPI equation.                          This calculation has not been                          validated in all clinical                          situations.  eGFR's persistently                          <90 mL/min signify                          possible Chronic Kidney Disease.  . Glucose-Capillary 01/19/2013 156* 70 - 99 mg/dL Final  . Comment 1 14/78/2956 Notify RN   Final  . Comment 2 01/19/2013 Documented in Chart   Final  Hospital Outpatient Visit on 01/08/2013  Component Date Value Range Status  . aPTT 01/08/2013 33  24 - 37 seconds Final  . Sodium 01/08/2013 137  135 - 145 mEq/L Final  . Potassium 01/08/2013 3.7  3.5 - 5.1 mEq/L Final  . Chloride 01/08/2013 102  96 - 112 mEq/L Final  . CO2 01/08/2013 26  19 - 32 mEq/L Final  . Glucose, Bld 01/08/2013 134* 70 - 99 mg/dL Final  . BUN 21/30/8657 11  6 - 23 mg/dL Final  . Creatinine, Ser 01/08/2013 0.56  0.50 - 1.10 mg/dL Final  . Calcium 84/69/6295 9.5  8.4 - 10.5 mg/dL Final  . Total Protein 01/08/2013 7.3  6.0 - 8.3 g/dL Final  . Albumin 28/41/3244 3.8  3.5 - 5.2 g/dL Final  . AST 08/03/7251 18  0 - 37 U/L Final  . ALT 01/08/2013 28  0 - 35 U/L Final  . Alkaline Phosphatase 01/08/2013 100  39 - 117 U/L Final  . Total Bilirubin 01/08/2013 0.2* 0.3 - 1.2 mg/dL Final  . GFR  calc non Af Amer 01/08/2013 >90  >90 mL/min Final  . GFR calc Af Amer 01/08/2013 >90  >90 mL/min Final   Comment:                                 The eGFR has been calculated                          using the CKD EPI equation.                          This calculation has not been                          validated in all clinical                          situations.                          eGFR's persistently                          <90 mL/min signify                          possible Chronic Kidney Disease.  [name] Prothrombin Time 01/08/2013 12.5  11.6 - 15.2 seconds Final  . INR 01/08/2013 0.94  0.00 - 1.49 Final  . ABO/RH(D) 01/08/2013 O POS   Final  . Antibody Screen 01/08/2013 NEG   Final  . Sample Expiration 01/08/2013 01/20/2013   Final  . Color, Urine 01/08/2013 YELLOW  YELLOW Final  . APPearance 01/08/2013 CLOUDY* CLEAR Final  .  Specific Gravity, Urine 01/08/2013 1.018  1.005 - 1.030 Final  . pH 01/08/2013 5.0  5.0 - 8.0 Final  . Glucose, UA 01/08/2013 NEGATIVE  NEGATIVE mg/dL Final  . Hgb urine dipstick 01/08/2013 NEGATIVE  NEGATIVE Final  . Bilirubin Urine 01/08/2013 NEGATIVE  NEGATIVE Final  . Ketones, ur 01/08/2013 NEGATIVE  NEGATIVE mg/dL Final  . Protein, ur 16/05/9603 NEGATIVE  NEGATIVE mg/dL Final  . Urobilinogen, UA 01/08/2013 0.2  0.0 - 1.0 mg/dL Final  . Nitrite 54/04/8118 NEGATIVE  NEGATIVE Final  . Leukocytes, UA 01/08/2013 NEGATIVE  NEGATIVE Final   MICROSCOPIC NOT DONE ON URINES WITH NEGATIVE PROTEIN, BLOOD, LEUKOCYTES, NITRITE, OR GLUCOSE <1000 mg/dL.  . WBC 01/08/2013 7.3  4.0 - 10.5 K/uL Final  . RBC 01/08/2013 4.29  3.87 - 5.11 MIL/uL Final  . Hemoglobin 01/08/2013 11.7* 12.0 - 15.0 g/dL Final  . HCT 14/78/2956 35.8* 36.0 - 46.0 % Final  . MCV 01/08/2013 83.4  78.0 - 100.0 fL Final  . MCH 01/08/2013 27.3  26.0 - 34.0 pg Final  . MCHC 01/08/2013 32.7  30.0 - 36.0 g/dL Final  . RDW 21/30/8657 14.5  11.5 - 15.5 % Final  . Platelets 01/08/2013 239  150 -  400 K/uL Final  . MRSA, PCR 01/08/2013 NEGATIVE  NEGATIVE Final  . Staphylococcus aureus 01/08/2013 NEGATIVE  NEGATIVE Final   Comment:                                 The Xpert SA Assay (FDA                          approved for NASAL specimens                          in patients over 65 years of age),                          is one component of                          a comprehensive surveillance                          program.  Test performance has                          been validated by [employer] for patients greater                          than or equal to 72 year old.                          It is not intended                          to diagnose infection nor to                          guide or monitor treatment.  . ABO/RH(D)  01/08/2013 O POS   Final  Office Visit on 12/07/2012  Component Date Value Range Status  . Hemoglobin A1C 12/07/2012 7.3  %   Final   normal range  4.2-6.3 %  . Sodium 12/07/2012 140  135 - 145 mEq/L Final  . Potassium 12/07/2012 3.4* 3.5 - 5.3 mEq/L Final  . Chloride 12/07/2012 106  96 - 112 mEq/L Final  . CO2 12/07/2012 23  19 - 32 mEq/L Final  . Glucose, Bld 12/07/2012 201* 70 - 99 mg/dL Final  . BUN 16/05/9603 10  6 - 23 mg/dL Final  . Creat 54/04/8118 0.63  0.50 - 1.10 mg/dL Final  . Total Bilirubin 12/07/2012 0.4  0.3 - 1.2 mg/dL Final  . Alkaline Phosphatase 12/07/2012 75  39 - 117 U/L Final  . AST 12/07/2012 19  0 - 37 U/L Final  . ALT 12/07/2012 23  0 - 35 U/L Final  . Total Protein 12/07/2012 6.7  6.0 - 8.3 g/dL Final  . Albumin 14/78/2956 4.2  3.5 - 5.2 g/dL Final  . Calcium 21/30/8657 9.3  8.4 - 10.5 mg/dL Final  . GFR, Est African American 12/07/2012 >89   Final  . GFR, Est Non African American 12/07/2012 >89   Final   Comment:                            The estimated GFR is a calculation valid for adults (>=69 years old)                          that uses the CKD-EPI algorithm to adjust for age and  sex. It is                            not to be used for children, pregnant women, hospitalized patients,                             patients on dialysis, or with rapidly changing kidney function.                          According to the NKDEP, eGFR >89 is normal, 60-89 shows mild                          impairment, 30-59 shows moderate impairment, 15-29 shows severe                          impairment and <15 is ESRD.                             [name] LDL Particle Number 12/07/2012 [id number]  <1000 nmol/L Final   Comment:                            Reference Range:                          ----------------                          Low:                <  1000                          Moderate:           1000-1299                          Borderline-High:    1300-1599                          High:               1600-2000                          Very High:          >2000                                                        . LDL (calc) 12/07/2012 60  <147 mg/dL Final   Comment: LDL-C is inaccurate if patient is nonfasting.                                                     Reference Range:                          ----------------                          Optimal:            <100                          Near/Above Optimal: 100-129                          Borderline High:    130-159                          High:               160-189                          Very High:          >=190                                                        . HDL-C 12/07/2012 40  >=40 mg/dL Final  . Triglycerides 12/07/2012 147  <150 mg/dL Final  . Cholesterol, Total 12/07/2012 129  <200 mg/dL Final  . HDL Particle Number 12/07/2012 29.8* >=30.5 umol/L Final  . Large HDL-P 12/07/2012 2.9* >=4.8 umol/L Final  . Large VLDL-P 12/07/2012 3.5* <=2.7 nmol/L Final  . Small LDL Particle Number 12/07/2012 530* <=527 nmol/L Final  . LDL Size  12/07/2012 20.5* >20.5 nm Final  . HDL Size 12/07/2012 8.7* >=9.2 nm  Final  . VLDL Size 12/07/2012 44.4  <=19.1 nm Final  . LP-IR Score 12/07/2012 63* <=45 Final   Comment:                            HDL Particle Number, Large HDL-P, Large VLDL-P, Small LDL Particle                          Number, LDL Size, HDL Size, VLDL Size, and LP-IR Score have been                          validated and are reported by [employer] but not cleared by                          the [place] FDA; the clinical utility of these test results has not been                          fully established.                             . WBC 12/07/2012 5.5  4.6 - 10.2 K/uL Final  . Lymph, poc 12/07/2012 1.7  0.6 - 3.4 Final  . POC LYMPH PERCENT 12/07/2012 31.0  10 - 50 %L Final  . POC Granulocyte 12/07/2012 3.5  2 - 6.9 Final  . Granulocyte percent 12/07/2012 63.8  37 - 80 %G Final  . RBC 12/07/2012 4.2  4.04 - 5.48 M/uL Final  . Hemoglobin 12/07/2012 12.9  12.2 - 16.2 g/dL Final  . HCT, POC 47/82/9562 36.1* 37.7 - 47.9 % Final  . MCV 12/07/2012 85.5  80 - 97 fL Final  . MCH, POC 12/07/2012 30.5  27 - 31.2 pg Final  . MCHC 12/07/2012 35.7* 31.8 - 35.4 g/dL Final  . RDW, POC 13/03/6577 14.5   Final  . Platelet Count, POC 12/07/2012 212.0  142 - 424 K/uL Final  . MPV 12/07/2012 7.9  0 - 99.8 fL Final  . Vit D, 25-Hydroxy 12/07/2012 58  30 - 89 ng/mL Final   Comment: This assay accurately quantifies Vitamin D, which is the sum of the                          25-Hydroxy forms of Vitamin D2 and D3.  Studies have shown that the                          optimum concentration of 25-Hydroxy Vitamin D is 30 ng/mL or higher.                           Concentrations of Vitamin D between 20 and 29 ng/mL are considered to                          be insufficient and concentrations less than 20 ng/mL are considered                          to be deficient for Vitamin  D.     X-Rays:Dg Knee Left Port  01/17/2013   *RADIOLOGY REPORT*  Clinical Data: Post left knee replacement  PORTABLE LEFT KNEE  - 1-2 VIEW  Comparison: None.  Findings: Two views of the left knee submitted.  There is left knee prosthesis in anatomic alignment.  Postsurgical changes are noted with periarticular soft tissue air.  A surgical drain in place noted.  IMPRESSION: Left knee prosthesis in anatomic alignment.  Postsurgical changes are noted.   Original Report Authenticated By: Natasha Mead, M.D.    EKG: Orders placed in visit on 02/06/12  . EKG 12-LEAD     Hospital Course: [name] [name] is a 62 y.o. who was admitted to [facility]. They were brought to the operating room on 01/17/2013 and underwent Procedure(s): LEFT TOTAL KNEE ARTHROPLASTY.  Patient tolerated the procedure well and was later transferred to the recovery room and then to the orthopaedic floor for postoperative care.  They were given PO and IV analgesics for pain control following their surgery.  They were given 24 hours of postoperative antibiotics of  Anti-infectives   Start     Dose/Rate Route Frequency Ordered Stop   01/17/13 2000  clindamycin (CLEOCIN) IVPB 600 mg     600 mg 100 mL/hr over 30 Minutes Intravenous Every 6 hours 01/17/13 1732 01/18/13 0242   01/17/13 1417  polymyxin B 500,000 Units, bacitracin 50,000 Units in sodium chloride irrigation 0.9 % 500 mL irrigation  Status:  Discontinued       As needed 01/17/13 1418 01/17/13 1609   01/17/13 1129  clindamycin (CLEOCIN) IVPB 900 mg     900 mg 100 mL/hr over 30 Minutes Intravenous On call to O.R. 01/17/13 1129 01/17/13 1322     and started on DVT prophylaxis in the form of Xarelto and TED hose.   PT and OT were ordered for total joint protocol.  Discharge planning consulted to help with postop disposition and equipment needs.  Patient had a good night on the evening of surgery.  They started to get up OOB with therapy on day one. Hemovac drain was pulled without difficulty.  Continued to work with therapy into day two.  By day two, the patient had progressed with therapy and  meeting their goals.  Incision was healing well, aquacel dressing clean, dry, intact.  Patient was seen in rounds and was ready to go home.   Discharge Medications: Prior to Admission medications   Medication Sig Start Date End Date Taking? Authorizing Provider  albuterol (PROVENTIL HFA;VENTOLIN HFA) 108 (90 BASE) MCG/ACT inhaler Inhale 2 puffs into the lungs every 4 (four) hours as needed for wheezing or shortness of breath. Take two puffs four times a day as needed 09/04/12 09/04/13 Yes Clinton D Young, MD  albuterol (PROVENTIL) (2.5 MG/3ML) 0.083% nebulizer solution Take 3 mLs (2.5 mg total) by nebulization every 6 (six) hours as needed for wheezing. DX  491.9 08/23/12  Yes Waymon Budge, MD  ALPRAZolam Prudy Feeler) 0.25 MG tablet Take 0.25 mg by mouth every 8 (eight) hours as needed for anxiety.   Yes Historical Provider, MD  atorvastatin (LIPITOR) 40 MG tablet Take 40 mg by mouth at bedtime.  02/06/12  Yes Vesta Mixer, MD  benzonatate (TESSALON) 200 MG capsule Take 1 capsule (200 mg total) by mouth 3 (three) times daily as needed for cough. 10/10/12 10/10/13 Yes Clinton D Young, MD  calcium carbonate (OS-CAL) 600 MG TABS Take 600 mg by mouth daily.  Yes Historical Provider, MD  dexlansoprazole (DEXILANT) 60 MG capsule Take 1 capsule (60 mg total) by mouth every morning. 12/07/12  Yes Mary-Margaret Daphine Deutscher, FNP  fluticasone (FLONASE) 50 MCG/ACT nasal spray Place 2 sprays into the nose daily.   Yes Historical Provider, MD  Fluticasone-Salmeterol (ADVAIR) 500-50 MCG/DOSE AEPB Inhale 1 puff into the lungs every 12 (twelve) hours.   Yes Historical Provider, MD  furosemide (LASIX) 40 MG tablet Take 40 mg by mouth every morning. 02/06/12  Yes Vesta Mixer, MD  metFORMIN (GLUCOPHAGE) 500 MG tablet Take 500 mg by mouth daily with breakfast. Takes at bedtime.   Yes Historical Provider, MD  mometasone-formoterol [facility]) 100-5 MCG/ACT AERO 2 puffs then rinse mouth twice daily maintenance controller 11/05/12 11/05/13  Yes Clinton D Young, MD  sertraline (ZOLOFT) 50 MG tablet Take 1 tablet (50 mg total) by mouth at bedtime. 12/07/12  Yes Mary-Margaret Daphine Deutscher, FNP  aspirin EC 81 MG tablet Take 81 mg by mouth daily.    Historical Provider, MD  Cholecalciferol (VITAMIN D) 1000 UNITS capsule Take 1,000 Units by mouth every morning.     Historical Provider, MD  vitamin B-12 (CYANOCOBALAMIN) 1000 MCG tablet Take 1,000 mcg by mouth daily.     Historical Provider, MD    Diet: Regular diet Activity:WBAT Follow-up:in 10-14 days Disposition - Home Discharged Condition: good   Discharge Orders   Future Appointments Provider Department Dept Phone   05/07/2013 10:45 AM Waymon Budge, MD [facility] [phone]   Future Orders Complete By Expires     Call MD / Call 911  As directed     Comments:      If you experience chest pain or shortness of breath, CALL 911 and be transported to the hospital emergency room.  If you develope a fever above 101 F, pus (white drainage) or increased drainage or redness at the wound, or calf pain, call your surgeon's office.    Constipation Prevention  As directed     Comments:      Drink plenty of fluids.  Prune juice may be helpful.  You may use a stool softener, such as Colace (over the counter) 100 mg twice a day.  Use MiraLax (over the counter) for constipation as needed.    Diet - low sodium heart healthy  As directed     Increase activity slowly as tolerated  As directed         Medication List    STOP taking these medications       EPINEPHrine 0.3 mg/0.3 mL Devi  Commonly known as:  EPIPEN      TAKE these medications       albuterol (2.5 MG/3ML) 0.083% nebulizer solution  Commonly known as:  PROVENTIL  Take 3 mLs (2.5 mg total) by nebulization every 6 (six) hours as needed for wheezing. DX  491.9     albuterol 108 (90 BASE) MCG/ACT inhaler  Commonly known as:  PROVENTIL HFA;VENTOLIN HFA  Inhale 2 puffs into the lungs every 4 (four) hours as needed for  wheezing or shortness of breath. Take two puffs four times a day as needed     ALPRAZolam 0.25 MG tablet  Commonly known as:  XANAX  Take 0.25 mg by mouth every 8 (eight) hours as needed for anxiety.     aspirin EC 81 MG tablet  Take 81 mg by mouth daily.     atorvastatin 40 MG tablet  Commonly known as:  LIPITOR  Take 40 mg  by mouth at bedtime.     benzonatate 200 MG capsule  Commonly known as:  TESSALON  Take 1 capsule (200 mg total) by mouth 3 (three) times daily as needed for cough.     calcium carbonate 600 MG Tabs  Commonly known as:  OS-CAL  Take 600 mg by mouth daily.     dexlansoprazole 60 MG capsule  Commonly known as:  DEXILANT  Take 1 capsule (60 mg total) by mouth every morning.     fluticasone 50 MCG/ACT nasal spray  Commonly known as:  FLONASE  Place 2 sprays into the nose daily.     Fluticasone-Salmeterol 500-50 MCG/DOSE Aepb  Commonly known as:  ADVAIR  Inhale 1 puff into the lungs every 12 (twelve) hours.     furosemide 40 MG tablet  Commonly known as:  LASIX  Take 40 mg by mouth every morning.     metFORMIN 500 MG tablet  Commonly known as:  GLUCOPHAGE  Take 500 mg by mouth daily with breakfast. Takes at bedtime.     mometasone-formoterol 100-5 MCG/ACT Aero  Commonly known as:  DULERA  2 puffs then rinse mouth twice daily maintenance controller     sertraline 50 MG tablet  Commonly known as:  ZOLOFT  Take 1 tablet (50 mg total) by mouth at bedtime.     vitamin B-12 1000 MCG tablet  Commonly known as:  CYANOCOBALAMIN  Take 1,000 mcg by mouth daily.     Vitamin D 1000 UNITS capsule  Take 1,000 Units by mouth every morning.         SignedDorothy Spark. 01/19/2013, 8:21 AM

## 2013-01-19 NOTE — Progress Notes (Signed)
Physical Therapy Treatment Patient Details Name: [name] [name] MRN: [id number] DOB: 04/15/1951 Today's Date: 01/19/2013 Time: 1191-4782 PT Time Calculation (min): 41 min  PT Assessment / Plan / Recommendation Comments on Treatment Session  Increased time all tasks.    Follow Up Recommendations  Home health PT     Does the patient have the potential to tolerate intense rehabilitation     Barriers to Discharge        Equipment Recommendations  Rolling walker with 5" wheels    Recommendations for Other Services OT consult  Frequency 7X/week   Plan Discharge plan remains appropriate    Precautions / Restrictions Precautions Precautions: Knee;Fall Required Braces or Orthoses: Knee Immobilizer - Left Knee Immobilizer - Left: Discontinue once straight leg raise with < 10 degree lag (Pt performed IND SLR this am) Restrictions Weight Bearing Restrictions: No Other Position/Activity Restrictions: WBAT   Pertinent Vitals/Pain     Mobility  Transfers Transfers: Sit to Stand;Stand to Sit Sit to Stand: 5: Supervision Stand to Sit: 5: Supervision Details for Transfer Assistance: cues for LE management and use of UEs to self assist Ambulation/Gait Ambulation/Gait Assistance: 4: Min guard;5: Supervision Ambulation Distance (Feet): 133 Feet Assistive device: Rolling walker Ambulation/Gait Assistance Details: cues for posture, stride length and position from RW Gait Pattern: Step-to pattern;Decreased stance time - left Stairs: Yes Stairs Assistance: 4: Min assist Stairs Assistance Details (indicate cue type and reason): cues for sequence and foot/cane placement Stair Management Technique: One rail Right;Forwards;With cane;Step to pattern Number of Stairs: 4    Exercises Total Joint Exercises Ankle Circles/Pumps: AROM;20 reps;Both;Supine Quad Sets: AROM;Supine;Both;20 reps Heel Slides: AAROM;Supine;Left;20 reps Straight Leg Raises: AROM;AAROM;Supine;Left;20 reps   PT  Diagnosis:    PT Problem List:   PT Treatment Interventions:     PT Goals Acute Rehab PT Goals PT Goal Formulation: With patient Time For Goal Achievement: 01/24/13 Potential to Achieve Goals: Good Pt will go Supine/Side to Sit: with supervision PT Goal: Supine/Side to Sit - Progress: Progressing toward goal Pt will go Sit to Supine/Side: with supervision PT Goal: Sit to Supine/Side - Progress: Progressing toward goal Pt will go Sit to Stand: with supervision PT Goal: Sit to Stand - Progress: Met Pt will go Stand to Sit: with supervision PT Goal: Stand to Sit - Progress: Met Pt will Ambulate: 51 - 150 feet;with supervision;with rolling walker PT Goal: Ambulate - Progress: Met Pt will Go Up / Down Stairs: 3-5 stairs;with min assist;with least restrictive assistive device PT Goal: Up/Down Stairs - Progress: Met  Visit Information  Last PT Received On: 01/19/13 Assistance Needed: +1    Subjective Data  Subjective: I'm going home today Patient Stated Goal: Resume previous lifestyle with decreased pain   Cognition  Cognition Arousal/Alertness: Awake/alert Behavior During Therapy: WFL for tasks assessed/performed Overall Cognitive Status: Within Functional Limits for tasks assessed    Balance     End of Session PT - End of Session Equipment Utilized During Treatment: Gait belt Activity Tolerance: Patient tolerated treatment well Patient left: in chair;with call bell/phone within reach;with family/visitor present Nurse Communication: Mobility status   GP     [name] [name] 01/19/2013, 12:12 PM

## 2013-02-19 ENCOUNTER — Ambulatory Visit: Payer: Medicaid Other | Attending: Orthopedic Surgery | Admitting: Physical Therapy

## 2013-02-19 DIAGNOSIS — Z96659 Presence of unspecified artificial knee joint: Secondary | ICD-10-CM | POA: Insufficient documentation

## 2013-02-19 DIAGNOSIS — IMO0001 Reserved for inherently not codable concepts without codable children: Secondary | ICD-10-CM | POA: Insufficient documentation

## 2013-02-19 DIAGNOSIS — M25569 Pain in unspecified knee: Secondary | ICD-10-CM | POA: Insufficient documentation

## 2013-02-19 DIAGNOSIS — R5381 Other malaise: Secondary | ICD-10-CM | POA: Insufficient documentation

## 2013-02-19 DIAGNOSIS — M25669 Stiffness of unspecified knee, not elsewhere classified: Secondary | ICD-10-CM | POA: Insufficient documentation

## 2013-02-22 ENCOUNTER — Ambulatory Visit: Payer: Medicaid Other | Admitting: Physical Therapy

## 2013-02-26 ENCOUNTER — Ambulatory Visit: Payer: Medicaid Other | Admitting: Physical Therapy

## 2013-02-28 ENCOUNTER — Ambulatory Visit: Payer: Medicaid Other | Admitting: Physical Therapy

## 2013-03-05 ENCOUNTER — Ambulatory Visit: Payer: Medicaid Other | Attending: Orthopedic Surgery | Admitting: Physical Therapy

## 2013-03-05 DIAGNOSIS — R5381 Other malaise: Secondary | ICD-10-CM | POA: Insufficient documentation

## 2013-03-05 DIAGNOSIS — IMO0001 Reserved for inherently not codable concepts without codable children: Secondary | ICD-10-CM | POA: Insufficient documentation

## 2013-03-05 DIAGNOSIS — Z96659 Presence of unspecified artificial knee joint: Secondary | ICD-10-CM | POA: Insufficient documentation

## 2013-03-05 DIAGNOSIS — M25669 Stiffness of unspecified knee, not elsewhere classified: Secondary | ICD-10-CM | POA: Insufficient documentation

## 2013-03-05 DIAGNOSIS — M25569 Pain in unspecified knee: Secondary | ICD-10-CM | POA: Insufficient documentation

## 2013-03-08 ENCOUNTER — Ambulatory Visit: Payer: Medicaid Other

## 2013-03-11 ENCOUNTER — Other Ambulatory Visit: Payer: Self-pay | Admitting: *Deleted

## 2013-03-11 MED ORDER — METFORMIN HCL 500 MG PO TABS: 500.0000 mg | ORAL_TABLET | Freq: Every day | ORAL | Status: DC

## 2013-03-11 NOTE — Telephone Encounter (Signed)
HOSP ENCOUNTER 6/14.

## 2013-03-12 ENCOUNTER — Encounter: Payer: Medicaid Other | Admitting: *Deleted

## 2013-03-14 ENCOUNTER — Encounter: Payer: Medicaid Other | Admitting: Physical Therapy

## 2013-04-23 ENCOUNTER — Encounter: Payer: Self-pay | Admitting: Cardiovascular Disease

## 2013-05-02 ENCOUNTER — Encounter: Payer: Self-pay | Admitting: Cardiovascular Disease

## 2013-05-02 ENCOUNTER — Ambulatory Visit (INDEPENDENT_AMBULATORY_CARE_PROVIDER_SITE_OTHER): Payer: Medicaid Other | Admitting: Cardiovascular Disease

## 2013-05-02 VITALS — BP 124/76 | HR 70 | Ht 60.0 in | Wt 164.0 lb

## 2013-05-02 DIAGNOSIS — E119 Type 2 diabetes mellitus without complications: Secondary | ICD-10-CM

## 2013-05-02 DIAGNOSIS — E785 Hyperlipidemia, unspecified: Secondary | ICD-10-CM

## 2013-05-02 DIAGNOSIS — R079 Chest pain, unspecified: Secondary | ICD-10-CM

## 2013-05-02 DIAGNOSIS — I10 Essential (primary) hypertension: Secondary | ICD-10-CM

## 2013-05-02 NOTE — Assessment & Plan Note (Signed)
Her CP is noncardiac.  She needs to exercise on a regular basis.

## 2013-05-02 NOTE — Patient Instructions (Addendum)
Your physician wants you to follow-up in: 1 YEAR.  You will receive a reminder letter in the mail two months in advance. If you don't receive a letter, please call our office to schedule the follow-up appointment.  Your physician recommends that you continue on your current medications as directed. Please refer to the Current Medication list given to you today.  

## 2013-05-02 NOTE — Progress Notes (Signed)
[name] [name] Date of Birth  18-Feb-1951       [facility]    [place] [address]  [address] [place], [place]  [address]   [place], [place]  [address] [phone]     [phone]   Fax  [phone]    Fax [phone]  Problem List: 1. Noncardiac chest pain 2. Normal heart catheterization 2006 3. Hyperlipidemia 4. Left knee pain  History of Present Illness:  [name] [name] has done well. It's been over 2 years since last saw her in the office. She has a history of noncardiac chest pain. She's had a normal heart catheterization the past. She complains of having some palpitations and shortness of breath particularly when she exerts herself but she's not been able to do much in the way of exercise with her bad knee. She  has been able to do all her normal activities without any significant problems.  Oct. 2, 2014:  [name] [name] is doing well.  No CP. She had left knee replacement this past summer.   She is doing well.   She still has some chest tightness but    Current Outpatient Prescriptions on File Prior to Visit  Medication Sig Dispense Refill  . albuterol (PROVENTIL HFA;VENTOLIN HFA) 108 (90 BASE) MCG/ACT inhaler Inhale 2 puffs into the lungs every 4 (four) hours as needed for wheezing or shortness of breath. Take two puffs four times a day as needed  1 Inhaler  prn  . albuterol (PROVENTIL) (2.5 MG/3ML) 0.083% nebulizer solution Take 3 mLs (2.5 mg total) by nebulization every 6 (six) hours as needed for wheezing. DX  491.9  150 mL  12  . ALPRAZolam (XANAX) 0.25 MG tablet Take 0.25 mg by mouth every 8 (eight) hours as needed for anxiety.      [name] aspirin EC 81 MG tablet Take 81 mg by mouth daily.      [name] atorvastatin (LIPITOR) 40 MG tablet Take 40 mg by mouth at bedtime.       . benzonatate (TESSALON) 200 MG capsule Take 1 capsule (200 mg total) by mouth 3 (three) times daily as needed for cough.  20 capsule  prn  . calcium carbonate (OS-CAL)  600 MG TABS Take 600 mg by mouth daily.       . Cholecalciferol (VITAMIN D) 1000 UNITS capsule Take 1,000 Units by mouth every morning.       [name] dexlansoprazole (DEXILANT) 60 MG capsule Take 1 capsule (60 mg total) by mouth every morning.  30 capsule  5  . fluticasone (FLONASE) 50 MCG/ACT nasal spray Place 2 sprays into the nose daily.      . Fluticasone-Salmeterol (ADVAIR) 500-50 MCG/DOSE AEPB Inhale 1 puff into the lungs every 12 (twelve) hours.      . furosemide (LASIX) 40 MG tablet Take 40 mg by mouth every morning.      [name] HYDROmorphone (DILAUDID) 2 MG tablet Take 1 tablet (2 mg total) by mouth every 4 (four) hours as needed.  40 tablet  0  . methocarbamol (ROBAXIN) 500 MG tablet Take 1 tablet (500 mg total) by mouth every 6 (six) hours as needed.  40 tablet  1  . mometasone-formoterol (DULERA) 100-5 MCG/ACT AERO 2 puffs then rinse mouth twice daily maintenance controller  1 Inhaler  prn  . vitamin B-12 (CYANOCOBALAMIN) 1000 MCG tablet Take 1,000 mcg by mouth daily.        No current facility-administered medications on file  prior to visit.    Allergies  Allergen Reactions  . Boniva [Ibandronate Sodium]     Jaw popping  . Ceclor [Cefaclor] Other (See Comments)    Reaction=burning all over  . Codeine Nausea And Vomiting  . Cortisone Hives    All over body  . Penicillins     Past Medical History  Diagnosis Date  . Insomnia, unspecified   . Esophageal reflux   . Hyperlipemia   . Asthma   . Osteoporosis   . Arthritis   . COPD (chronic obstructive pulmonary disease)   . Depression   . Personal history of colonic polyps 09/2010    TUBULAR ADENOMAS (X3); NEGATIVE FOR HIGH GRADE DYSPLASIA OR MALIGNANCY.  . Barrett esophagus   . Hiatal hernia   . Esophageal stricture   . GERD (gastroesophageal reflux disease)   . GAD (generalized anxiety disorder)   . Aortic valve disorders   . Dysrhythmia     heart skips per pt   . Heart murmur   . Anxiety   . Shortness of breath     with  exertion   . Pneumonia     hx of   . Type II or unspecified type diabetes mellitus without mention of complication, not stated as uncontrolled     on no meds   . Anemia     hx of years ago   . Hypertension 12/07/2012  . Myocardial infarction 01-08-13    '06-Chest pain-no stent-dx. MI-stress related  . 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 History of kidney stones 01-08-13    past hx.  . Transfusion history 01-08-13    past hx. many yrs ago    Past Surgical History  Procedure Laterality Date  . Cholecystectomy  2005  . Partial hysterectomy    . Shoulder surgery  2011  . Tubal ligation    . Tonsillectomy    . Foot surgery  2005    Left foot  . Wrist flexion tendon tenotomies and proximal corpectomy w/ wrist arthrodesis&iliac crest bone graft    . Cardiac catheterization      normal per Dr Elease Hashimoto in note dated 02/06/12   . Knee arthroscopy  02/28/2012    Procedure: ARTHROSCOPY KNEE;  Surgeon: Jacki Cones, MD;  Location: [facility];  Service: Orthopedics;  Laterality: Left;  . Esophageal dilation  01-08-13    2 yrs ago  . Cataract extraction, bilateral    . Total knee arthroplasty Left 01/17/2013    Procedure: LEFT TOTAL KNEE ARTHROPLASTY;  Surgeon: Jacki Cones, MD;  Location: [facility];  Service: Orthopedics;  Laterality: Left;    History  Smoking status  . Current Some Day Smoker -- 0.25 packs/day for 40 years  . Types: Cigarettes  . Last Attempt to Quit: 06/24/2012  Smokeless tobacco  . Former Neurosurgeon  . Types: Snuff, Chew  . Quit date: 12/31/2011    Comment: 4-5 cigs per day///01-08-13    History  Alcohol Use  . 0.6 oz/week  . 1 Cans of beer per week    Comment: occasionally    Family History  Problem Relation Age of Onset  . Breast cancer Mother   . Diabetes Maternal Grandfather   . Kidney disease      Both sides of family  . Heart disease      Both sides of family  . Colon cancer Brother 84  . Colon polyps Brother   .  Breast cancer  Maternal Aunt   . Breast cancer Maternal Grandmother     Reviw of Systems:  Reviewed in the HPI.  All other systems are negative.  Physical Exam: Blood pressure 124/76, pulse 70, height 5' (1.524 m), weight 164 lb (74.39 kg). General: Well developed, well nourished, in no acute distress.  Head: Normocephalic, atraumatic, sclera non-icteric, mucus membranes are moist,   Neck: Supple. Carotids are 2 + without bruits. No JVD  Lungs: Clear bilaterally to auscultation.  Heart: regular rate.  normal  S1 S2. No murmurs, gallops or rubs.  Abdomen: Soft, non-tender, non-distended with normal bowel sounds. No hepatomegaly. No rebound/guarding. No masses.  Msk:  Strength and tone are normal  Extremities: No clubbing or cyanosis. No edema.  Distal pedal pulses are 2+ and equal bilaterally.  Neuro: Alert and oriented X 3. Moves all extremities spontaneously.  Psych:  Responds to questions appropriately with a normal affect.  ECG: Oct. 2, 2014:  NSR at 70.  Voltage for LVH   Assessment / Plan:

## 2013-05-02 NOTE — Assessment & Plan Note (Signed)
Stable

## 2013-05-07 ENCOUNTER — Ambulatory Visit (INDEPENDENT_AMBULATORY_CARE_PROVIDER_SITE_OTHER): Payer: Medicaid Other | Admitting: Internal Medicine

## 2013-05-07 ENCOUNTER — Encounter: Payer: Self-pay | Admitting: Internal Medicine

## 2013-05-07 VITALS — BP 132/62 | HR 72 | Ht 60.0 in | Wt 169.2 lb

## 2013-05-07 DIAGNOSIS — Z720 Tobacco use: Secondary | ICD-10-CM

## 2013-05-07 DIAGNOSIS — J42 Unspecified chronic bronchitis: Secondary | ICD-10-CM

## 2013-05-07 DIAGNOSIS — J209 Acute bronchitis, unspecified: Secondary | ICD-10-CM

## 2013-05-07 DIAGNOSIS — Z23 Encounter for immunization: Secondary | ICD-10-CM

## 2013-05-07 DIAGNOSIS — F17200 Nicotine dependence, unspecified, uncomplicated: Secondary | ICD-10-CM

## 2013-05-07 MED ORDER — MOMETASONE FURO-FORMOTEROL FUM 100-5 MCG/ACT IN AERO: INHALATION_SPRAY | RESPIRATORY_TRACT | Status: DC

## 2013-05-07 MED ORDER — FLUTICASONE PROPIONATE 50 MCG/ACT NA SUSP: 2.0000 | Freq: Every day | NASAL | Status: DC

## 2013-05-07 NOTE — Progress Notes (Signed)
07/24/12- 61 yoF former smoker (06/24/12), former patient at old office,  Ref By Dr. Myers/ [facility] -- pt reports having prod cough w thick green mucus, pnd, chest tightness x4 weeks occuring mostly w weather change She reports cough and chest congestion off and on for years. Says she just stopped smoking in November of 2013. Now green sputum for 2 or 3 weeks with scratchy throat but no fever, blood or pain. Some cough even on good days. Occasional wheeze and she uses her rescue inhaler between 3 and 6 times daily. History of an allergic component and was on allergy vaccine years ago. Reports urticaria last week after steroid injection in her knee and shoulder, and also had flu vaccine about the same time. She treated herself with Benadryl successfully. She remains on Benadryl 50 mg twice daily. Has had hives before and "nervous". Several episodes of pneumonia. Aware of GERD symptoms despite Dexilant. CXR- 02/22/12 image reviewed IMPRESSION:  Negative exam.  Original Report Authenticated By: Rosealee Albee, M.D.   09/04/12- 32 yoF former smoker (06/24/12), former patient at old office,  Follows For: 4 week f/u - Non prod cough - Sinus and chest congestion - SOB worse with weather changes - Chest tightness and wheezing - Out of Advair for 2 weeks Advair sample helped. Dry cough. Postnasal drip with yellow nasal discharge. Hives resolved. Nebulizer does help. CXR 09/04/12 IMPRESSION:  No acute disease.  Original Report Authenticated By: Holley Dexter, M.D.  11/05/12- 55 yoF former smoker (06/24/12), former patient at old office, followed for chronic bronchitis. FOLLOWS FOR: PFTs today-- pt reports breathing is "slightly worse" d/t weather, gets SOB easier, prod cough at times-- denies any other concerns at this time [facility] sample worked well instead of advair. LOV nebulizer and depomedrol helped " a bunch", as did Tessalon for cough. Still smoking 4-5 cigarettes/ day and resists blaming this for any  airway problems. Blames weather. Also blames oil heat and lack of airconditioning in her home. Asks letter stating those are aggravating her breathing. Says she looks for opportunities to be out of house. CXR 09/04/12 IMPRESSION:  No acute disease.  Original Report Authenticated By: Holley Dexter, M.D. PFT- 11/05/12- Normal except mild reduction of DLCO. No response to bronchodilator. FVC 2.17/ 83%, FEV1 1.76/ 91%, FEV1/FVC 0.81, TLC 93%, DLCO 68%.  05/07/13- 62 yoF former smoker (06/24/12), former patient at old office, followed for chronic bronchitis FOLLOWS AVW:UJWJX more sob since last night,chest tightness mid chest, cough at hs -yellow green,denies wheezing and chest pain,says Dulera worked well,needs refills for [employer] Mostly coughs with colds. Admits he still smokes an occasional cigarette and we discussed smoking cessation.  ROS-see HPI Constitutional:   No-   weight loss, night sweats, fevers, chills, fatigue, lassitude. HEENT:   No-  headaches, difficulty swallowing, tooth/dental problems, sore throat,       No-  Sneezing, itching, no-ear ache, nasal congestion, post nasal drip,  CV:  No-   chest pain, orthopnea, PND, swelling in lower extremities, anasarca, dizziness, +palpitations Resp: +  shortness of breath with exertion or at rest.              No-   productive cough,  + non-productive cough,  No- coughing up of blood.              No-   change in color of mucus.  + wheezing.   Skin: No- rash or lesions. GI:  No- heartburn, indigestion, no-abdominal pain, nausea, vomiting,  GU: . MS:  +  joint pain or swelling.   Neuro-     nothing unusual Psych:  No- change in mood or affect. + depression or anxiety.  No memory loss.  OBJ- Physical Exam General- Alert, Oriented, Affect-appropriate, Distress- none acute. Overweight Skin- No rash Lymphadenopathy- none Head- atraumatic            Eyes- Gross vision intact, PERRLA, conjunctivae and secretions clear            Ears-  Hearing, canals-normal            Nose- Clear, no-Septal dev, mucus, polyps, erosion, perforation             Throat- Mallampati II , mucosa clear , drainage- none, tonsils- atrophic Neck- flexible , trachea midline, no stridor , thyroid nl, carotid no bruit Chest - symmetrical excursion , unlabored           Heart/CV- RRR , no murmur , no gallop  , no rub, nl s1 s2                           - JVD- none , edema- none, stasis changes- none, varices- none           Lung-  + cough with deep breath again noted , dullness-none, rub- none           Chest wall-  Abd-  Br/ Gen/ Rectal- Not done, not indicated Extrem- cyanosis- none, clubbing, none, atrophy- none, strength- nl Neuro- grossly intact to observation

## 2013-05-07 NOTE — Patient Instructions (Signed)
Scripts sent for [employer]  Flu vax  Please keep trying to stop those last few cigarettes  Please call as needed

## 2013-05-14 ENCOUNTER — Encounter (HOSPITAL_COMMUNITY): Payer: Self-pay

## 2013-05-14 ENCOUNTER — Encounter (HOSPITAL_COMMUNITY)
Admission: RE | Admit: 2013-05-14 | Discharge: 2013-05-14 | Disposition: A | Discharge status: Home / Self Care | Payer: Medicaid Other | Source: Ambulatory Visit | Attending: Internal Medicine | Admitting: Internal Medicine

## 2013-05-14 ENCOUNTER — Other Ambulatory Visit (HOSPITAL_COMMUNITY): Payer: Self-pay | Admitting: Internal Medicine

## 2013-05-14 DIAGNOSIS — M810 Age-related osteoporosis without current pathological fracture: Secondary | ICD-10-CM | POA: Insufficient documentation

## 2013-05-14 MED ORDER — SODIUM CHLORIDE 0.9 % IV SOLN
Freq: Once | INTRAVENOUS | Status: AC
  Administered 2013-05-14: 20 mL/h via INTRAVENOUS

## 2013-05-14 MED ORDER — ZOLEDRONIC ACID 5 MG/100ML IV SOLN
5.0000 mg | Freq: Once | INTRAVENOUS | Status: AC
  Administered 2013-05-14: 5 mg via INTRAVENOUS
  Filled 2013-05-14: qty 100

## 2013-05-20 ENCOUNTER — Encounter: Payer: Self-pay | Admitting: Internal Medicine

## 2013-05-20 DIAGNOSIS — Z720 Tobacco use: Secondary | ICD-10-CM | POA: Insufficient documentation

## 2013-05-20 NOTE — Assessment & Plan Note (Signed)
He did not want to get into a conversation about his smoking. I encouraged him to stop and offered support

## 2013-05-20 NOTE — Assessment & Plan Note (Addendum)
Exacerbations particularly associated with respiratory infection. Plan-flu shot, refill Dulera, smoking cessation

## 2013-06-10 ENCOUNTER — Other Ambulatory Visit: Payer: Self-pay

## 2013-06-10 NOTE — Telephone Encounter (Signed)
Last seen 12/07/12  Mae  Last glucose 6/14

## 2013-06-12 MED ORDER — METFORMIN HCL 500 MG PO TABS: 500.0000 mg | ORAL_TABLET | Freq: Every day | ORAL | Status: DC

## 2013-07-09 ENCOUNTER — Other Ambulatory Visit: Payer: Self-pay | Admitting: *Deleted

## 2013-07-09 MED ORDER — ATORVASTATIN CALCIUM 40 MG PO TABS: 40.0000 mg | ORAL_TABLET | Freq: Every day | ORAL | Status: DC

## 2013-07-09 NOTE — Telephone Encounter (Signed)
Last labs 06/14 

## 2013-08-06 ENCOUNTER — Encounter: Payer: Self-pay | Admitting: Advanced Practice Midwife

## 2013-08-06 ENCOUNTER — Ambulatory Visit (INDEPENDENT_AMBULATORY_CARE_PROVIDER_SITE_OTHER): Payer: Medicaid Other | Admitting: Advanced Practice Midwife

## 2013-08-06 VITALS — BP 147/76 | HR 64 | Temp 97.8°F | Ht 60.0 in | Wt 171.0 lb

## 2013-08-06 DIAGNOSIS — Z0000 Encounter for general adult medical examination without abnormal findings: Secondary | ICD-10-CM

## 2013-08-06 DIAGNOSIS — N644 Mastodynia: Secondary | ICD-10-CM

## 2013-08-06 DIAGNOSIS — Z780 Asymptomatic menopausal state: Secondary | ICD-10-CM

## 2013-08-06 NOTE — Progress Notes (Signed)
Pt in office today for annual Gyn exam, reports new growth inside upper labia

## 2013-08-06 NOTE — Progress Notes (Signed)
Subjective:    KALASIA CRAFTON is a 63 y.o. female who presents for an annual exam. The patient has no complaints today. The patient is not sexually active. GYN screening history: last pap: was normal and patient does not recall when last pap was and last mammogram: approximate date 09/2012 and was normal. The patient wears seatbelts: yes. The patient participates in regular exercise: no. Has the patient ever been transfused or tattooed?: not asked. The patient reports that there is not domestic violence in her life.   Patient used to be a Dealer.  Menstrual History: OB History   Grav Para Term Preterm Abortions TAB SAB Ect Mult Living                  Menarche age: NA  No LMP recorded. Patient has had a hysterectomy.    The following portions of the patient's history were reviewed and updated as appropriate: allergies, current medications, past family history, past medical history, past social history, past surgical history and problem list.  Review of Systems A comprehensive review of systems was negative.    Objective:    BP 147/76  Pulse 64  Temp(Src) 97.8 F (36.6 C)  Ht 5' (1.524 m)  Wt 171 lb (77.565 kg)  BMI 33.40 kg/m2  General Appearance:    Alert, cooperative, no distress, appears stated age  Head:    Normocephalic, without obvious abnormality, atraumatic  Eyes:    PERRL, conjunctiva/corneas clear, EOM's intact, fundi    benign, both eyes  Ears:    Normal TM's and external ear canals, both ears  Nose:   Nares normal, septum midline, mucosa normal, no drainage    or sinus tenderness  Throat:   Lips, mucosa, and tongue normal; teeth and gums normal  Neck:   Supple, symmetrical, trachea midline, no adenopathy;    thyroid:  no enlargement/tenderness/nodules; no carotid   bruit or JVD  Back:     Symmetric, no curvature, ROM normal, no CVA tenderness  Lungs:     Clear to auscultation bilaterally, respirations unlabored  Chest Wall:    No tenderness or deformity   Heart:  Heart Murmur auscultated  Breast Exam:    Tenderness from 6 to 9oclock on left breast 8/10 in pain, patient jumped, tissue dense in that area, masses, or nipple abnormality, circular area red tissue approx size of quarter on right breast no change per patient report, not raised (biopsy scar)  Abdomen:     Soft, non-tender, bowel sounds active all four quadrants,    no masses, no organomegaly  Genitalia:    Small sebaceous cyst above clitoral hood, no evidence of infection. 1 mm in diameter. Normal female without lesion, discharge or tenderness  Rectal:    Normal tone, normal prostate, no masses or tenderness;   guaiac negative stool  Extremities:   Extremities normal, atraumatic, no cyanosis or edema  Pulses:   2+ and symmetric all extremities  Skin:   Skin color, texture, turgor normal, no rashes or lesions  Lymph nodes:   Cervical, supraclavicular, and axillary nodes normal  Neurologic:   CNII-XII intact, normal strength, sensation and reflexes    throughout  .    Assessment:   Patient Active Problem List   Diagnosis Date Noted  . Tobacco user 05/20/2013  . Postoperative anemia due to acute blood loss 01/18/2013  . Osteoarthritis of left knee 01/17/2013  . Hypertension 12/07/2012  . Peripheral edema 12/07/2012  . Type II or unspecified type diabetes  mellitus without mention of complication, not stated as uncontrolled 12/07/2012  . Chronic bronchitis with acute exacerbation 08/05/2012  . Seasonal and perennial allergic rhinitis 08/05/2012  . Urticaria 08/05/2012  . Chest pain 02/06/2012  . Hyperlipidemia 02/06/2012   Chief Complaint   Gynecologic Exam     Post Menopausal Breast Pain, new on exam (dense tissue) Sebaceous cyst on perineum    Plan:     Mammogram.  For new onset of pain. Pending. Patient up to date on DEXA and colonoscopy. Warm compress to perineum, RTC if worsens or symptomatic. RTC PRN Continue care and management w/ PCP.  50 min spent with  patient greater than 80% spent in counseling and coordination of care.   Latonia Conrow Roni Bread CNM

## 2013-08-19 ENCOUNTER — Encounter: Payer: Self-pay | Admitting: Internal Medicine

## 2013-08-29 ENCOUNTER — Other Ambulatory Visit: Payer: Self-pay | Admitting: Internal Medicine

## 2013-08-29 NOTE — Telephone Encounter (Signed)
Received faxed refill request from [facility] for Paroxetine 20mg  QD #30 This medication is not currently on pt's med list She sees Ramos FNP for PCP Note written on request with the above info and a request to defer to pt's PCP

## 2013-09-11 ENCOUNTER — Other Ambulatory Visit: Payer: Self-pay | Admitting: *Deleted

## 2013-09-11 NOTE — Telephone Encounter (Signed)
No refill until seen

## 2013-09-11 NOTE — Telephone Encounter (Signed)
Patient notified at last visit that Yoder. Please advise

## 2013-10-02 ENCOUNTER — Encounter: Payer: Self-pay | Admitting: Internal Medicine

## 2013-10-26 ENCOUNTER — Encounter (HOSPITAL_COMMUNITY): Payer: Self-pay | Admitting: Emergency Medicine

## 2013-10-26 ENCOUNTER — Observation Stay (HOSPITAL_COMMUNITY)
Admission: EM | Admit: 2013-10-26 | Discharge: 2013-10-26 | Disposition: A | Discharge status: Home / Self Care | Payer: Medicaid Other | Attending: Internal Medicine | Admitting: Internal Medicine

## 2013-10-26 ENCOUNTER — Emergency Department (HOSPITAL_COMMUNITY): Payer: Medicaid Other

## 2013-10-26 ENCOUNTER — Observation Stay (HOSPITAL_COMMUNITY): Payer: Medicaid Other

## 2013-10-26 DIAGNOSIS — R079 Chest pain, unspecified: Principal | ICD-10-CM

## 2013-10-26 DIAGNOSIS — F411 Generalized anxiety disorder: Secondary | ICD-10-CM | POA: Insufficient documentation

## 2013-10-26 DIAGNOSIS — G4700 Insomnia, unspecified: Secondary | ICD-10-CM | POA: Insufficient documentation

## 2013-10-26 DIAGNOSIS — I6350 Cerebral infarction due to unspecified occlusion or stenosis of unspecified cerebral artery: Secondary | ICD-10-CM | POA: Insufficient documentation

## 2013-10-26 DIAGNOSIS — Z8701 Personal history of pneumonia (recurrent): Secondary | ICD-10-CM | POA: Insufficient documentation

## 2013-10-26 DIAGNOSIS — J449 Chronic obstructive pulmonary disease, unspecified: Secondary | ICD-10-CM | POA: Insufficient documentation

## 2013-10-26 DIAGNOSIS — K222 Esophageal obstruction: Secondary | ICD-10-CM | POA: Insufficient documentation

## 2013-10-26 DIAGNOSIS — D649 Anemia, unspecified: Secondary | ICD-10-CM | POA: Insufficient documentation

## 2013-10-26 DIAGNOSIS — Z87891 Personal history of nicotine dependence: Secondary | ICD-10-CM | POA: Insufficient documentation

## 2013-10-26 DIAGNOSIS — M810 Age-related osteoporosis without current pathological fracture: Secondary | ICD-10-CM | POA: Insufficient documentation

## 2013-10-26 DIAGNOSIS — Z860100 Personal history of colon polyps, unspecified: Secondary | ICD-10-CM | POA: Insufficient documentation

## 2013-10-26 DIAGNOSIS — Z880 Allergy status to penicillin: Secondary | ICD-10-CM | POA: Insufficient documentation

## 2013-10-26 DIAGNOSIS — M129 Arthropathy, unspecified: Secondary | ICD-10-CM | POA: Insufficient documentation

## 2013-10-26 DIAGNOSIS — R269 Unspecified abnormalities of gait and mobility: Secondary | ICD-10-CM

## 2013-10-26 DIAGNOSIS — E785 Hyperlipidemia, unspecified: Secondary | ICD-10-CM | POA: Insufficient documentation

## 2013-10-26 DIAGNOSIS — I639 Cerebral infarction, unspecified: Secondary | ICD-10-CM | POA: Diagnosis present

## 2013-10-26 DIAGNOSIS — F3289 Other specified depressive episodes: Secondary | ICD-10-CM | POA: Insufficient documentation

## 2013-10-26 DIAGNOSIS — G459 Transient cerebral ischemic attack, unspecified: Secondary | ICD-10-CM | POA: Diagnosis present

## 2013-10-26 DIAGNOSIS — I359 Nonrheumatic aortic valve disorder, unspecified: Secondary | ICD-10-CM | POA: Insufficient documentation

## 2013-10-26 DIAGNOSIS — Z87442 Personal history of urinary calculi: Secondary | ICD-10-CM | POA: Insufficient documentation

## 2013-10-26 DIAGNOSIS — I499 Cardiac arrhythmia, unspecified: Secondary | ICD-10-CM | POA: Insufficient documentation

## 2013-10-26 DIAGNOSIS — K2270 Barrett's esophagus without dysplasia: Secondary | ICD-10-CM | POA: Insufficient documentation

## 2013-10-26 DIAGNOSIS — I10 Essential (primary) hypertension: Secondary | ICD-10-CM | POA: Insufficient documentation

## 2013-10-26 DIAGNOSIS — J4489 Other specified chronic obstructive pulmonary disease: Secondary | ICD-10-CM | POA: Insufficient documentation

## 2013-10-26 DIAGNOSIS — R011 Cardiac murmur, unspecified: Secondary | ICD-10-CM | POA: Insufficient documentation

## 2013-10-26 DIAGNOSIS — IMO0002 Reserved for concepts with insufficient information to code with codable children: Secondary | ICD-10-CM | POA: Insufficient documentation

## 2013-10-26 DIAGNOSIS — Z79899 Other long term (current) drug therapy: Secondary | ICD-10-CM | POA: Insufficient documentation

## 2013-10-26 DIAGNOSIS — E119 Type 2 diabetes mellitus without complications: Secondary | ICD-10-CM | POA: Insufficient documentation

## 2013-10-26 DIAGNOSIS — Z9189 Other specified personal risk factors, not elsewhere classified: Secondary | ICD-10-CM | POA: Insufficient documentation

## 2013-10-26 DIAGNOSIS — R2681 Unsteadiness on feet: Secondary | ICD-10-CM

## 2013-10-26 DIAGNOSIS — I252 Old myocardial infarction: Secondary | ICD-10-CM | POA: Insufficient documentation

## 2013-10-26 DIAGNOSIS — K219 Gastro-esophageal reflux disease without esophagitis: Secondary | ICD-10-CM | POA: Insufficient documentation

## 2013-10-26 DIAGNOSIS — F329 Major depressive disorder, single episode, unspecified: Secondary | ICD-10-CM | POA: Insufficient documentation

## 2013-10-26 DIAGNOSIS — K449 Diaphragmatic hernia without obstruction or gangrene: Secondary | ICD-10-CM | POA: Insufficient documentation

## 2013-10-26 LAB — HEPATIC FUNCTION PANEL
ALK PHOS: 83 U/L (ref 39–117)
ALT: 34 U/L (ref 0–35)
AST: 26 U/L (ref 0–37)
Albumin: 4 g/dL (ref 3.5–5.2)
Total Bilirubin: <0.2 mg/dL — ABNORMAL LOW (ref 0.3–1.2)
Total Protein: 7.1 g/dL (ref 6.0–8.3)

## 2013-10-26 LAB — CBC
HCT: 37.2 % (ref 36.0–46.0)
Hemoglobin: 12.8 g/dL (ref 12.0–15.0)
MCH: 31.1 pg (ref 26.0–34.0)
MCHC: 34.4 g/dL (ref 30.0–36.0)
MCV: 90.3 fL (ref 78.0–100.0)
PLATELETS: 216 10*3/uL (ref 150–400)
RBC: 4.12 MIL/uL (ref 3.87–5.11)
RDW: 15.4 % (ref 11.5–15.5)
WBC: 6.4 10*3/uL (ref 4.0–10.5)

## 2013-10-26 LAB — PROTIME-INR
INR: 0.96 (ref 0.00–1.49)
Prothrombin Time: 12.6 seconds (ref 11.6–15.2)

## 2013-10-26 LAB — BASIC METABOLIC PANEL
BUN: 11 mg/dL (ref 6–23)
CALCIUM: 9.8 mg/dL (ref 8.4–10.5)
CO2: 27 mEq/L (ref 19–32)
Chloride: 99 mEq/L (ref 96–112)
Creatinine, Ser: 0.66 mg/dL (ref 0.50–1.10)
GFR calc Af Amer: >90 mL/min (ref >90)
Glucose, Bld: 169 mg/dL — ABNORMAL HIGH (ref 70–99)
POTASSIUM: 4.1 meq/L (ref 3.7–5.3)
Sodium: 138 mEq/L (ref 137–147)

## 2013-10-26 LAB — DIFFERENTIAL
BASOS ABS: 0 10*3/uL (ref 0.0–0.1)
BASOS PCT: 0 % (ref 0–1)
EOS PCT: 7 % — AB (ref 0–5)
Eosinophils Absolute: 0.5 10*3/uL (ref 0.0–0.7)
Lymphocytes Relative: 43 % (ref 12–46)
Lymphs Abs: 3 10*3/uL (ref 0.7–4.0)
MONO ABS: 0.5 10*3/uL (ref 0.1–1.0)
Monocytes Relative: 7 % (ref 3–12)
Neutro Abs: 3 10*3/uL (ref 1.7–7.7)
Neutrophils Relative %: 43 % (ref 43–77)

## 2013-10-26 LAB — I-STAT TROPONIN, ED: TROPONIN I, POC: 0.01 ng/mL (ref 0.00–0.08)

## 2013-10-26 LAB — APTT: aPTT: 31 seconds (ref 24–37)

## 2013-10-26 LAB — PRO B NATRIURETIC PEPTIDE: Pro B Natriuretic peptide (BNP): 41 pg/mL (ref 0–125)

## 2013-10-26 MED ORDER — CALCIUM CARBONATE ANTACID 500 MG PO CHEW
1.0000 | CHEWABLE_TABLET | Freq: Every day | ORAL | Status: DC
  Administered 2013-10-26: 200 mg via ORAL
  Filled 2013-10-26 (×2): qty 1

## 2013-10-26 MED ORDER — ASPIRIN 325 MG PO TBEC: 325.0000 mg | DELAYED_RELEASE_TABLET | Freq: Every day | ORAL | Status: DC

## 2013-10-26 MED ORDER — PANTOPRAZOLE SODIUM 40 MG PO TBEC
40.0000 mg | DELAYED_RELEASE_TABLET | Freq: Every day | ORAL | Status: DC
Start: 2013-10-26 — End: 2013-10-26
  Administered 2013-10-26: 40 mg via ORAL
  Filled 2013-10-26: qty 1

## 2013-10-26 MED ORDER — CALCIUM CARBONATE 600 MG PO TABS
600.0000 mg | ORAL_TABLET | Freq: Every day | ORAL | Status: DC
  Filled 2013-10-26 (×2): qty 1

## 2013-10-26 MED ORDER — SODIUM CHLORIDE 0.9 % IJ SOLN
3.0000 mL | Freq: Two times a day (BID) | INTRAMUSCULAR | Status: DC
  Administered 2013-10-26: 3 mL via INTRAVENOUS

## 2013-10-26 MED ORDER — ATORVASTATIN CALCIUM 40 MG PO TABS
40.0000 mg | ORAL_TABLET | Freq: Every day | ORAL | Status: DC
  Filled 2013-10-26: qty 1

## 2013-10-26 MED ORDER — ALBUTEROL SULFATE (2.5 MG/3ML) 0.083% IN NEBU: 3.0000 mL | INHALATION_SOLUTION | RESPIRATORY_TRACT | Status: DC | PRN

## 2013-10-26 MED ORDER — VITAMIN D 1000 UNITS PO CAPS: 1000.0000 [IU] | ORAL_CAPSULE | Freq: Every morning | ORAL | Status: DC

## 2013-10-26 MED ORDER — FUROSEMIDE 40 MG PO TABS
40.0000 mg | ORAL_TABLET | Freq: Every morning | ORAL | Status: DC
  Administered 2013-10-26: 40 mg via ORAL
  Filled 2013-10-26: qty 1

## 2013-10-26 MED ORDER — ASPIRIN EC 325 MG PO TBEC
325.0000 mg | DELAYED_RELEASE_TABLET | Freq: Every day | ORAL | Status: DC
  Administered 2013-10-26: 325 mg via ORAL
  Filled 2013-10-26: qty 1

## 2013-10-26 MED ORDER — PAROXETINE HCL 20 MG PO TABS
20.0000 mg | ORAL_TABLET | Freq: Every day | ORAL | Status: DC
  Administered 2013-10-26: 20 mg via ORAL
  Filled 2013-10-26: qty 1

## 2013-10-26 MED ORDER — ENOXAPARIN SODIUM 40 MG/0.4ML ~~LOC~~ SOLN
40.0000 mg | SUBCUTANEOUS | Status: DC
Start: 2013-10-26 — End: 2013-10-26
  Administered 2013-10-26: 40 mg via SUBCUTANEOUS
  Filled 2013-10-26 (×2): qty 0.4

## 2013-10-26 MED ORDER — FERROUS SULFATE 325 (65 FE) MG PO TABS
325.0000 mg | ORAL_TABLET | Freq: Every day | ORAL | Status: DC
  Filled 2013-10-26: qty 1

## 2013-10-26 MED ORDER — ALPRAZOLAM 0.25 MG PO TABS: 0.2500 mg | ORAL_TABLET | Freq: Three times a day (TID) | ORAL | Status: DC | PRN

## 2013-10-26 MED ORDER — VITAMIN D3 25 MCG (1000 UNIT) PO TABS
1000.0000 [IU] | ORAL_TABLET | Freq: Every day | ORAL | Status: DC
  Administered 2013-10-26: 1000 [IU] via ORAL
  Filled 2013-10-26: qty 1

## 2013-10-26 MED ORDER — NITROGLYCERIN 0.4 MG SL SUBL
0.4000 mg | SUBLINGUAL_TABLET | SUBLINGUAL | Status: DC | PRN
  Administered 2013-10-26 (×2): 0.4 mg via SUBLINGUAL

## 2013-10-26 MED ORDER — MOMETASONE FURO-FORMOTEROL FUM 100-5 MCG/ACT IN AERO
2.0000 | INHALATION_SPRAY | Freq: Two times a day (BID) | RESPIRATORY_TRACT | Status: DC
  Administered 2013-10-26: 2 via RESPIRATORY_TRACT
  Filled 2013-10-26: qty 8.8

## 2013-10-26 MED ORDER — METFORMIN HCL 500 MG PO TABS
500.0000 mg | ORAL_TABLET | Freq: Every day | ORAL | Status: DC
  Administered 2013-10-26: 500 mg via ORAL
  Filled 2013-10-26 (×2): qty 1

## 2013-10-26 NOTE — ED Notes (Signed)
Patient arrives via EMS from home with complaint of chest pain. 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 Patient explains that she has been feeling some symptoms of weakness and lightheadedness for about 3 days. EMS gave 324 ASA, and 1 NTG SL tab with chest pain reducing from a 8 to 2/10.

## 2013-10-26 NOTE — ED Notes (Signed)
Pt returned to room  

## 2013-10-26 NOTE — Discharge Summary (Signed)
Physician Discharge Summary    [name] [name]  [name]#: [id number]  DOB:June 30, 1951  Date of Admission: 10/26/2013 Date of Discharge: 10/26/2013  Attending Physician:Kiev Labrosse  Patient's FVC:[id number], [name] Reaper, MD  Consults:Treatment Team:  Catarina Hartshorn, MD    Discharge Diagnoses: Active Problems:   Gait instability     TIA (transient ischemic attack)   Discharge Medications:   Medication List         albuterol (2.5 MG/3ML) 0.083% nebulizer solution  Commonly known as:  PROVENTIL  Take 3 mLs (2.5 mg total) by nebulization every 6 (six) hours as needed for wheezing. DX  491.9     albuterol 108 (90 BASE) MCG/ACT inhaler  Commonly known as:  PROVENTIL HFA;VENTOLIN HFA  Inhale 2 puffs into the lungs every 4 (four) hours as needed for wheezing or shortness of breath. Take two puffs four times a day as needed     ALPRAZolam 0.25 MG tablet  Commonly known as:  XANAX  Take 0.25 mg by mouth every 8 (eight) hours as needed for anxiety.     aspirin 325 MG EC tablet  Take 1 tablet (325 mg total) by mouth daily.     atorvastatin 40 MG tablet  Commonly known as:  LIPITOR  Take 1 tablet (40 mg total) by mouth at bedtime.     calcium carbonate 600 MG Tabs tablet  Commonly known as:  OS-CAL  Take 600 mg by mouth daily.     dexlansoprazole 60 MG capsule  Commonly known as:  DEXILANT  Take 1 capsule (60 mg total) by mouth every morning.     ferrous sulfate 325 (65 FE) MG tablet  Take 325 mg by mouth daily with breakfast.     fluticasone 50 MCG/ACT nasal spray  Commonly known as:  FLONASE  Place 2 sprays into the nose daily.     furosemide 40 MG tablet  Commonly known as:  LASIX  Take 40 mg by mouth every morning.     metFORMIN 500 MG tablet  Commonly known as:  GLUCOPHAGE  Take 1 tablet (500 mg total) by mouth daily. Takes at bedtime.     mometasone-formoterol 100-5 MCG/ACT Aero  Commonly known as:  DULERA  2 puffs then rinse mouth twice daily maintenance  controller     PARoxetine 20 MG tablet  Commonly known as:  PAXIL  Take 20 mg by mouth daily.     vitamin B-12 1000 MCG tablet  Commonly known as:  CYANOCOBALAMIN  Take 1,000 mcg by mouth daily.     Vitamin D 1000 UNITS capsule  Take 1,000 Units by mouth every morning.        Hospital Procedures: Ct Head Wo [name]  10/26/2013   CLINICAL DATA:  Dizziness and posterior headache  EXAM: CT HEAD WITHOUT [name]  TECHNIQUE: Contiguous axial images were obtained from the base of the skull through the vertex without intravenous [name].  COMPARISON:  10/04/2007  FINDINGS: Skull and Sinuses:Mucosal thickening in the bilateral paranasal sinuses with secretion retention within the left sphenoid.  Orbits: Bilateral cataract resection.  Brain: No evidence of acute abnormality, such as acute infarction, hemorrhage, hydrocephalus, or mass lesion/mass effect.  IMPRESSION: 1. No acute intracranial findings. 2. Sinusitis without acute effusions.   Electronically Signed   By: Jorje Guild M.D.   On: 10/26/2013 03:12   [name] [name]  10/26/2013   CLINICAL DATA:  63 year old female with abnormal gait and balance. Falling to the right. Ataxia. Initial encounter.  EXAM: MRI  HEAD WITHOUT [name]  TECHNIQUE: Multiplanar, multiecho pulse sequences of the brain and surrounding structures were obtained without intravenous [name].  COMPARISON:  Head CT without [name] 0259 hr the same day.  FINDINGS: Cerebral volume is within normal limits for age. No restricted diffusion to suggest acute infarction. No midline shift, mass effect, evidence of mass lesion, ventriculomegaly, extra-axial collection or acute intracranial hemorrhage. Cervicomedullary junction and pituitary are within normal limits. Major intracranial vascular flow voids are preserved. Negative visualized cervical spine. Normal bone marrow signal. Pearline Cables and white matter signal is within normal limits for age throughout the brain.  Visible  internal auditory structures appear normal. Trace inferior mastoid fluid on the right. Small volume retained secretions in the nasopharynx. Mild paranasal sinus mucosal thickening, most pronounced in the left sphenoid.  Postoperative changes to the globes. Diminutive, atrophied right parotid gland. Submandibular glands also appear atrophied. Other Visualized scalp soft tissues are within normal limits.  IMPRESSION: 1. Normal for age non [name] MRI appearance of the brain. 2. Mild paranasal sinus inflammatory changes. 3. Evidence of right parotid gland and bilateral submandibular gland atrophy. Query chronic sialoadenitis or inflammatory process such as Sjogren's syndrome.   Electronically Signed   By: Lars Pinks M.D.   On: 10/26/2013 14:34    History of Present Illness: Pt presented w/ dysarthria, facial droop, and ataxic gait. It all started > 24 hours prior to admission and was resolved by the time I evaluated the patient   Hospital Course: TIA - symptoms resolved on their own. MRI and CT showed NAICA to indicate CVA. She will have ASA increased from 81 to 325mg  daily and follow up as outpatient for CUS to r/o carotid stenosis. Her labs, vitals and clinical condition was at baseline during entire observation stay and she was stable for discharge home w/ close outpt f/u   Day of Discharge Exam BP 122/52  Pulse 68  Temp(Src) 97.6 F (36.4 C) (Oral)  Resp 18  Ht 5' (1.524 m)  Wt 80.423 kg (177 lb 4.8 oz)  BMI 34.63 kg/m2  SpO2 95%  Physical Exam: General appearance: WF in NAD  Eyes: no scleral icterus Throat: oropharynx moist without erythema Resp: CTAB, no wheezes or rales  Cardio: RRR, no MRG  GI: soft, non-tender; bowel sounds normal; no masses,  no organomegaly Extremities: no clubbing, cyanosis or edema Neuro : CN II-XII intact. No focal neuro deficits of upper or lower extremities . No dysarthria . No facial droop  Discharge Labs:  Recent Labs  10/26/13 0127  NA 138  K 4.1   CL 99  CO2 27  GLUCOSE 169*  BUN 11  CREATININE 0.66  CALCIUM 9.8    Recent Labs  10/26/13 0128  AST 26  ALT 34  ALKPHOS 83  BILITOT <0.2*  PROT 7.1  ALBUMIN 4.0    Recent Labs  10/26/13 0127 10/26/13 0134  WBC 6.4  --   NEUTROABS  --  3.0  HGB 12.8  --   HCT 37.2  --   MCV 90.3  --   PLT 216  --    Lab Results  Component Value Date   INR 0.96 10/26/2013   INR 0.94 01/08/2013   INR 0.95 02/22/2012   No results found for this basename: CKTOTAL, CKMB, CKMBINDEX, TROPONINI,  in the last 72 hours No results found for this basename: TSH, T4TOTAL, FREET3, T3FREE, THYROIDAB,  in the last 72 hours No results found for this basename: VITAMINB12, FOLATE, FERRITIN, TIBC, IRON, RETICCTPCT,  in the last 72 hours  Discharge instructions:     Discharge Orders   Future Appointments Provider Department Dept Phone   11/06/2013 10:30 AM Deneise Lever, MD [facility] [phone]   Future Orders Complete By Expires   Call MD for:  As directed    Comments:     Call MD for slurred speech, weakness, facial droop   Diet - low sodium heart healthy  As directed    Increase activity slowly  As directed      06-Home-Health Care Svc   Disposition: home   Follow-up Appts: Follow-up with Dr. Ardeth Perfect at [facility] in 3-7 days. Our office will call for appointment.  Condition on Discharge: stable   Tests Needing Follow-up: will need to order CUS as outpt to ensure no carotid stenosis as cuase of TIA   Time spent in discharge (includes decision making & examination of pt): 45 minutes    Signed: Aziah Brostrom 10/26/2013, 3:16 PM   su

## 2013-10-26 NOTE — H&P (Signed)
Physician Admission History and Physical     PCP:   Velna Hatchet, MD   Chief Complaint:  24+ hours of R facial droop, ataxic gait, dysarthria   HPI: [name] [name] is an 63 y.o. female. Patient presents from home when family noticed her falling to one side, having difficulty speaking, R sided facial droop. This has never occurred to her b/f and it has resolved at time of my exam. Neuro saw patient and recs that we perform MRI brain. We will admit and follow w/ neuro. She was on ASA 81 at home.   Her risk factors for TIA are DM/HLD.   Review of Systems:  ROS Neg except as noted above     Past Medical History (reviewed - no changes required): bronchitis and asthma -- requires 1-2x per week; neb daily  osteoporosis   depression/anxiety  DM // HLD // CAD  - clean cath  "fluid overload" on lasix  GERD  Vit D Def  iron def anemia  ( prior PCP * Dr Cindee Lame in [place] )( Dr Annamaria Boots * [place] [facility] )( Dr Cathie Olden * [place] ) ( Dr Melvyn Novas * ortho )  NO GYN - last PAP 2006 (referring to GYN )  Surgical History (reviewed - no changes required): L  foot surgery // L TKA // R shoulder surgery // R carpal tunnel release // Tubal ligation // choley // T&A // partial hysterectomy   Family History (reviewed - no changes required): CVA < 67 - dad  DM - mom and sibling  MI < 26 - mom and sibling  HTN - mom  anemia - mom  father - allergies and arthritis  Social History (reviewed - no changes required): disabled. divorced.  tob: none etoh: occ   Family History Summary:     Reviewed history Last on 09/25/2013 and no changes required:10/26/2013   General Comments - FH: CVA < 28 - dad  DM - mom and sibling  MI < 61 - mom and sibling  HTN - mom  anemia - mom  father - allergies and arthritis   Social History:    Reviewed history from 03/11/2013 and no changes required:       disabled. divorced.        tob: none       etoh: occ    Physical Exam  General appearance:  well nourished, well hydrated, no acute distress   Complete Medication List: 1)  Fluticasone Propionate 50 Mcg/act Nasal Susp (Fluticasone propionate) .... Spray 1 per nostril daily 2)  Hydromet 5-1.5 Mg/14ml Oral Syrp (Hydrocodone-homatropine) .... Take 68ml q6 hours prn cough 3)  Cefdinir 300 Mg Oral Caps (Cefdinir) .... Take 1 tab bid x 7 days 4)  Ferrous Sulfate 325 (65 Fe) Mg Tabs (Ferrous sulfate) .... Take 1 tab bid 5)  Paroxetine Hcl 20 Mg Tabs (Paroxetine hcl) .... Take 1 tab daily 6)  Reclast 5 Mg/159ml Soln (Zoledronic acid) .... Infuse 5mg  iv once a year (start september 2014) 7)  Vitamin D3 5000 Unit Caps (Cholecalciferol) .... Take 1 capsule by mouth every day 8)  Caltrate 600+d 600-400 Mg-unit Tabs (Calcium carbonate-vitamin d) .... Take 3 tablet by mouth every day 9)  B-12 1000 Mcg Caps (Cyanocobalamin) .... Take 1 capsule by mouth everyday 10)  Aspirin 81 Mg Tabs (Aspirin) .... Take 1 tablet by mouth every day 11)  Epipen 2-pak 0.3 Mg/0.50ml Soaj (Epinephrine) .... As needed 12)  Advair Diskus 500-50 Mcg/dose Aepb (Fluticasone-salmeterol) .... Inhale  2 times a day 13)  Proair Hfa 108 (90 Base) Mcg/act Aers (Albuterol sulfate) .... Inhale 2 puffs every 4 hours as needed 14)  Benzonatate 200 Mg Caps (Benzonatate) .... Take 1 capsule by mouth three times every day 15)  Albuterol Sulfate (2.5 Mg/108ml) 0.083% Nebu (Albuterol sulfate) .... Inhale every 6 hours as needed 16)  Metformin Hcl 500 Mg Tabs (Metformin hcl) .... Take 1 tab bid 17)  Atorvastatin Calcium 40 Mg Tabs (Atorvastatin calcium) .... Take 1 tablet by mouth every day 18)  Alprazolam 0.5 Mg Oral Tabs (Alprazolam) .... Take 1 tab tid prn anxiety 19)  Furosemide 40 Mg Tabs (Furosemide) .... Take 1 tablet by mouth every morning 20)  Dexilant 60 Mg Cpdr (Dexlansoprazole) .... Take 1 capsule by mouth every morning  Allergies:   Allergies  Allergen Reactions  . Pneumococcal Vaccines Other (See Comments)    PP-23 vaccine;  had BIG local red reaction with heat.   Jaclyn Prime [Ibandronate Sodium]     Jaw popping  . Ceclor [Cefaclor] Other (See Comments)    Reaction=burning all over  . Codeine Nausea And Vomiting  . Cortisone Hives    All over body  . Penicillins     Physical Exam: Filed Vitals:   10/26/13 0430 10/26/13 0445 10/26/13 0500 10/26/13 0602  BP: 115/57 104/51 110/50 120/55  Pulse: 55 52 57 55  Temp:    97.8 F (36.6 C)  TempSrc:    Oral  Resp: 22 22 22 18   Height:    5' (1.524 m)  Weight:    80.423 kg (177 lb 4.8 oz)  SpO2: 96% 96% 97% 94%   Gen: WF in NAD  HEENT: clear sclera, trachea midline  Lungs:CTAB, no rales, wheezes  Cardio:  RRR, no MRG  Abd: soft, NT, ND  MSK: 4/5 strength at R hand. Otherwise no focal weakness  Neuro: CN II-XII grossly intact  Skin:warm,dry    Labs on Admission:   Recent Labs  10/26/13 0127  NA 138  K 4.1  CL 99  CO2 27  GLUCOSE 169*  BUN 11  CREATININE 0.66  CALCIUM 9.8    Recent Labs  10/26/13 0128  AST 26  ALT 34  ALKPHOS 83  BILITOT <0.2*  PROT 7.1  ALBUMIN 4.0   No results found for this basename: LIPASE, AMYLASE,  in the last 72 hours  Recent Labs  10/26/13 0127 10/26/13 0134  WBC 6.4  --   NEUTROABS  --  3.0  HGB 12.8  --   HCT 37.2  --   MCV 90.3  --   PLT 216  --    No results found for this basename: CKTOTAL, CKMB, CKMBINDEX, TROPONINI,  in the last 72 hours Lab Results  Component Value Date   INR 0.96 10/26/2013   INR 0.94 01/08/2013   INR 0.95 02/22/2012   No results found for this basename: TSH, T4TOTAL, FREET3, T3FREE, THYROIDAB,  in the last 72 hours No results found for this basename: VITAMINB12, FOLATE, FERRITIN, TIBC, IRON, RETICCTPCT,  in the last 72 hours  Radiological Exams on Admission: Ct Head Wo Contrast  10/26/2013   CLINICAL DATA:  Dizziness and posterior headache  EXAM: CT HEAD WITHOUT CONTRAST  TECHNIQUE: Contiguous axial images were obtained from the base of the skull through the vertex  without intravenous contrast.  COMPARISON:  10/04/2007  FINDINGS: Skull and Sinuses:Mucosal thickening in the bilateral paranasal sinuses with secretion retention within the left sphenoid.  Orbits: Bilateral cataract  resection.  Brain: No evidence of acute abnormality, such as acute infarction, hemorrhage, hydrocephalus, or mass lesion/mass effect.  IMPRESSION: 1. No acute intracranial findings. 2. Sinusitis without acute effusions.   Electronically Signed   By: Jorje Guild M.D.   On: 10/26/2013 03:12   Orders placed during the hospital encounter of 10/26/13  . EKG 12-LEAD  . EKG 12-LEAD  . ED EKG  . ED EKG    Assessment/Plan TIA - stat brain MRI. Increase ASA to 325. Order A1C and lipid panel. Tele. Appreciate neuro recs   Otherwise will continue all home meds  lovenox for dvt ppx     Tymeka Privette 10/26/2013, 6:26 AM

## 2013-10-26 NOTE — ED Provider Notes (Signed)
CSN: [id number]     Arrival date & time 10/26/13  0106 History   First MD Initiated Contact with Patient 10/26/13 0224     Chief Complaint  Patient presents with  . Chest Pain     (Consider location/radiation/quality/duration/timing/severity/associated sxs/prior Treatment) Patient is a 63 y.o. female presenting with chest pain. The history is provided by the patient.  Chest Pain She has been having waxing and waning chest pain throughout the day. Pain is tall and tight with some radiation to the back and right arm. There is no associated nausea but there has been diaphoresis. She has noted some increasing dyspnea over the last several days which is not exertional or positional Nothing makes pain better nothing makes it worse. She did come in by ambulance and EMS gave her aspirin and nitroglycerin which did improve her pain. Pain is currently rated at 3/10 but was as severe as 8/10. She did not notice any difference in her pain based on body position or exertion. Also, during the day today she has felt weak and has been off balance. She tends to fall to the right when she tries to walk. She denies any headache. She denies visual change. Last known normal was greater than 24 hours ago.  Past Medical History  Diagnosis Date  . Insomnia, unspecified   . Esophageal reflux   . Hyperlipemia   . Asthma   . Osteoporosis   . Arthritis   . COPD (chronic obstructive pulmonary disease)   . Depression   . Personal history of colonic polyps 09/2010    TUBULAR ADENOMAS (X3); NEGATIVE FOR HIGH GRADE DYSPLASIA OR MALIGNANCY.  . Barrett esophagus   . Hiatal hernia   . Esophageal stricture   . GERD (gastroesophageal reflux disease)   . GAD (generalized anxiety disorder)   . Aortic valve disorders   . Dysrhythmia     heart skips per pt   . Heart murmur   . Anxiety   . Shortness of breath     with exertion   . Pneumonia     hx of   . Type II or unspecified type diabetes mellitus without mention of  complication, not stated as uncontrolled     on no meds   . Anemia     hx of years ago   . Hypertension 12/07/2012  . Myocardial infarction 01-08-13    '06-Chest pain-no stent-dx. MI-stress related  . 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 History of kidney stones 01-08-13    past hx.  . Transfusion history 01-08-13    past hx. many yrs ago   Past Surgical History  Procedure Laterality Date  . Cholecystectomy  2005  . Partial hysterectomy    . Shoulder surgery  2011  . Tubal ligation    . Tonsillectomy    . Foot surgery  2005    Left foot  . Wrist flexion tendon tenotomies and proximal corpectomy w/ wrist arthrodesis&iliac crest bone graft    . Cardiac catheterization      normal per Dr Acie Fredrickson in note dated 02/06/12   . Knee arthroscopy  02/28/2012    Procedure: ARTHROSCOPY KNEE;  Surgeon: Tobi Bastos, MD;  Location: [facility];  Service: Orthopedics;  Laterality: Left;  . Esophageal dilation  01-08-13    2 yrs ago  . Cataract extraction, bilateral    . Total knee arthroplasty Left 01/17/2013    Procedure: LEFT TOTAL KNEE ARTHROPLASTY;  Surgeon: Tobi Bastos, MD;  Location: [facility];  Service: Orthopedics;  Laterality: Left;   Family History  Problem Relation Age of Onset  . Breast cancer Mother   . Diabetes Maternal Grandfather   . Kidney disease      Both sides of family  . Heart disease      Both sides of family  . Colon cancer Brother 40  . Colon polyps Brother   . Breast cancer Maternal Aunt   . Breast cancer Maternal Grandmother    History  Substance Use Topics  . Smoking status: Former Smoker -- 0.25 packs/day for 40 years    Types: Cigarettes    Quit date: 06/24/2012  . Smokeless tobacco: Former Systems developer    Types: Snuff, Sarina Ser    Quit date: 12/31/2011     Comment: quit 3 months ago  . Alcohol Use: 0.6 oz/week    1 Cans of beer per week     Comment: occasionally   OB History   Grav Para Term Preterm Abortions TAB SAB Ect Mult Living                  Review of Systems  Cardiovascular: Positive for chest pain.  All other systems reviewed and are negative.      Allergies  Pneumococcal vaccines; Boniva; Ceclor; Codeine; Cortisone; and Penicillins  Home Medications   Current Outpatient Rx  Name  Route  Sig  Dispense  Refill  . EXPIRED: albuterol (PROVENTIL HFA;VENTOLIN HFA) 108 (90 BASE) MCG/ACT inhaler   Inhalation   Inhale 2 puffs into the lungs every 4 (four) hours as needed for wheezing or shortness of breath. Take two puffs four times a day as needed   1 Inhaler   prn   . albuterol (PROVENTIL) (2.5 MG/3ML) 0.083% nebulizer solution   Nebulization   Take 3 mLs (2.5 mg total) by nebulization every 6 (six) hours as needed for wheezing. DX  491.9   150 mL   12   . ALPRAZolam (XANAX) 0.25 MG tablet   Oral   Take 0.25 mg by mouth every 8 (eight) hours as needed for anxiety.         [name] aspirin EC 81 MG tablet   Oral   Take 81 mg by mouth daily.         [name] atorvastatin (LIPITOR) 40 MG tablet   Oral   Take 1 tablet (40 mg total) by mouth at bedtime.   30 tablet   0     Must be seen before next refill   . calcium carbonate (OS-CAL) 600 MG TABS   Oral   Take 600 mg by mouth daily.          . Cholecalciferol (VITAMIN D) 1000 UNITS capsule   Oral   Take 1,000 Units by mouth every morning.          [name] dexlansoprazole (DEXILANT) 60 MG capsule   Oral   Take 1 capsule (60 mg total) by mouth every morning.   30 capsule   5   . ferrous sulfate 325 (65 FE) MG tablet   Oral   Take 325 mg by mouth daily with breakfast.         . fluticasone (FLONASE) 50 MCG/ACT nasal spray   Nasal   Place 2 sprays into the nose daily.   16 g   prn   . furosemide (LASIX) 40 MG tablet   Oral   Take 40 mg by mouth every  morning.         . metFORMIN (GLUCOPHAGE) 500 MG tablet   Oral   Take 1 tablet (500 mg total) by mouth daily. Takes at bedtime.   30 tablet   0   . mometasone-formoterol (DULERA) 100-5  MCG/ACT AERO      2 puffs then rinse mouth twice daily maintenance controller   1 Inhaler   prn   . vitamin B-12 (CYANOCOBALAMIN) 1000 MCG tablet   Oral   Take 1,000 mcg by mouth daily.           BP 152/56  Pulse 54  Temp(Src) 98.2 F (36.8 C) (Oral)  Resp 18  SpO2 97% Physical Exam  Nursing note and vitals reviewed.  63 year old female, resting comfortably and in no acute distress. Vital signs are significant for bradycardia with heart rate 54, and hypertension with blood pressure 152/56. Oxygen saturation is 97%, which is normal. Head is normocephalic and atraumatic. PERRLA, EOMI. Oropharynx is clear. Fundi show no hemorrhage, exudate, or papilledema. There is no nystagmus.. Neck is nontender and supple without adenopathy or JVD. There are no carotid bruits. Back is nontender and there is no CVA tenderness.  Lungs are clear without rales, wheezes, or rhonchi. Chest is nontender. Heart has regular rate and rhythm without murmur. Abdomen is soft, flat, nontender without masses or hepatosplenomegaly and peristalsis is normoactive. Extremities have no cyanosis or edema, full range of motion is present. Skin is warm and dry without rash. Neurologic: Mental status is normal, cranial nerves are intact, there are no motor or sensory deficits. There is marked ataxia on the right with finger to nose testing. When she sits up, she is markedly off balance and falls to the right consistently.  ED Course  Procedures (including critical care time) Labs Review Results for orders placed during the hospital encounter of 10/26/13  CBC      Result Value Ref Range   WBC 6.4  4.0 - 10.5 K/uL   RBC 4.12  3.87 - 5.11 MIL/uL   Hemoglobin 12.8  12.0 - 15.0 g/dL   HCT 37.2  36.0 - 46.0 %   MCV 90.3  78.0 - 100.0 fL   MCH 31.1  26.0 - 34.0 pg   MCHC 34.4  30.0 - 36.0 g/dL   RDW 15.4  11.5 - 15.5 %   Platelets 216  150 - 400 K/uL  BASIC METABOLIC PANEL      Result Value Ref Range   Sodium 138   137 - 147 mEq/L   Potassium 4.1  3.7 - 5.3 mEq/L   Chloride 99  96 - 112 mEq/L   CO2 27  19 - 32 mEq/L   Glucose, Bld 169 (*) 70 - 99 mg/dL   BUN 11  6 - 23 mg/dL   Creatinine, Ser 0.66  0.50 - 1.10 mg/dL   Calcium 9.8  8.4 - 10.5 mg/dL   GFR calc non Af Amer >90  >90 mL/min   GFR calc Af Amer >90  >90 mL/min  PRO B NATRIURETIC PEPTIDE      Result Value Ref Range   Pro B Natriuretic peptide (BNP) 41.0  0 - 125 pg/mL  DIFFERENTIAL      Result Value Ref Range   Neutrophils Relative % 43  43 - 77 %   Neutro Abs 3.0  1.7 - 7.7 K/uL   Lymphocytes Relative 43  12 - 46 %   Lymphs Abs 3.0  0.7 - 4.0 K/uL  Monocytes Relative 7  3 - 12 %   Monocytes Absolute 0.5  0.1 - 1.0 K/uL   Eosinophils Relative 7 (*) 0 - 5 %   Eosinophils Absolute 0.5  0.0 - 0.7 K/uL   Basophils Relative 0  0 - 1 %   Basophils Absolute 0.0  0.0 - 0.1 K/uL  PROTIME-INR      Result Value Ref Range   Prothrombin Time 12.6  11.6 - 15.2 seconds   INR 0.96  0.00 - 1.49  APTT      Result Value Ref Range   aPTT 31  24 - 37 seconds  HEPATIC FUNCTION PANEL      Result Value Ref Range   Total Protein 7.1  6.0 - 8.3 g/dL   Albumin 4.0  3.5 - 5.2 g/dL   AST 26  0 - 37 U/L   ALT 34  0 - 35 U/L   Alkaline Phosphatase 83  39 - 117 U/L   Total Bilirubin <0.2 (*) 0.3 - 1.2 mg/dL   Bilirubin, Direct <0.2  0.0 - 0.3 mg/dL   Indirect Bilirubin NOT CALCULATED  0.3 - 0.9 mg/dL  I-STAT TROPOININ, ED      Result Value Ref Range   Troponin i, poc 0.01  0.00 - 0.08 ng/mL   Comment 3            Imaging Review Ct Head Wo Contrast  10/26/2013   CLINICAL DATA:  Dizziness and posterior headache  EXAM: CT HEAD WITHOUT CONTRAST  TECHNIQUE: Contiguous axial images were obtained from the base of the skull through the vertex without intravenous contrast.  COMPARISON:  10/04/2007  FINDINGS: Skull and Sinuses:Mucosal thickening in the bilateral paranasal sinuses with secretion retention within the left sphenoid.  Orbits: Bilateral cataract  resection.  Brain: No evidence of acute abnormality, such as acute infarction, hemorrhage, hydrocephalus, or mass lesion/mass effect.  IMPRESSION: 1. No acute intracranial findings. 2. Sinusitis without acute effusions.   Electronically Signed   By: Jorje Guild M.D.   On: 10/26/2013 03:12     EKG Interpretation   Date/Time:  Saturday October 26 2013 01:14:53 EDT Ventricular Rate:  53 PR Interval:  158 QRS Duration: 106 QT Interval:  488 QTC Calculation: 458 R Axis:   -33 Text Interpretation:  Sinus rhythm Left ventricular hypertrophy When  compared with ECG of 02/24/2010, No significant change was found Confirmed  by [facility]  MD, Ranjit Ashurst ([id number]) on 10/26/2013 2:39:21 AM      MDM   Final diagnoses:  Stroke  Chest pain    Chest pain of uncertain can cause. Constant pain through the day would make cardiac source unlikely but not totally excluded. Initial ECG and troponin are unremarkable. 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 Because last known normal was greater than 24 hours ago, she it does not qualify as a code stroke. Stroke workup is initiated. She is also given additional nitroglycerin. She will need to be made pain-free before being admitted for stroke workup. Old records are reviewed and she has been followed by cardiology and felt to have noncardiac chest pain.  There was complete relief of chest pain following a single nitroglycerin. Neurology has seen the patient and is not certain that she has had a stroke. Apparently, she has ataxia of the right arm at baseline although family tells me that it is clearly much worse today. She'll need to be admitted to get MRI scan done as well as to cycle of  troponin. Case is discussed with Dr. Ardeth Perfect who agrees to admit the patient under observation status.  Delora Fuel, MD [id number]

## 2013-10-26 NOTE — Consult Note (Signed)
Referring Physician: Roxanne Mins    Chief Complaint: Difficulty with balance  HPI: [name] [name] is an 63 y.o. female that reports that she went to bed normal on Wednesday.  On Thursday when she got up she felt off balance.  Felt as if she was falling to the right.  Patient was able to get through the day and then on awakening today felt even worse.  Patient also experienced chest pain and at that time presented for evaluation. Patient has a tremor of her RUE that has been present for years but seems worse today.  Also complains of an occipital headache with no associated nausea, vomiting, photophobia or phonophobia.    Date last known well: Date: 10/24/2013 Time last known well: Time: 01:00 tPA Given: No: Outside time window  Past Medical History  Diagnosis Date  . Insomnia, unspecified   . Esophageal reflux   . Hyperlipemia   . Asthma   . Osteoporosis   . Arthritis   . COPD (chronic obstructive pulmonary disease)   . Depression   . Personal history of colonic polyps 09/2010    TUBULAR ADENOMAS (X3); NEGATIVE FOR HIGH GRADE DYSPLASIA OR MALIGNANCY.  . Barrett esophagus   . Hiatal hernia   . Esophageal stricture   . GERD (gastroesophageal reflux disease)   . GAD (generalized anxiety disorder)   . Aortic valve disorders   . Dysrhythmia     heart skips per pt   . Heart murmur   . Anxiety   . Shortness of breath     with exertion   . Pneumonia     hx of   . Type II or unspecified type diabetes mellitus without mention of complication, not stated as uncontrolled     on no meds   . Anemia     hx of years ago   . Hypertension 12/07/2012  . Myocardial infarction 01-08-13    '06-Chest pain-no stent-dx. MI-stress related  . 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 History of kidney stones 01-08-13    past hx.  . Transfusion history 01-08-13    past hx. many yrs ago    Past Surgical History  Procedure Laterality Date  . Cholecystectomy  2005  . Partial  hysterectomy    . Shoulder surgery  2011  . Tubal ligation    . Tonsillectomy    . Foot surgery  2005    Left foot  . Wrist flexion tendon tenotomies and proximal corpectomy w/ wrist arthrodesis&iliac crest bone graft    . Cardiac catheterization      normal per Dr Acie Fredrickson in note dated 02/06/12   . Knee arthroscopy  02/28/2012    Procedure: ARTHROSCOPY KNEE;  Surgeon: Tobi Bastos, MD;  Location: [facility];  Service: Orthopedics;  Laterality: Left;  . Esophageal dilation  01-08-13    2 yrs ago  . Cataract extraction, bilateral    . Total knee arthroplasty Left 01/17/2013    Procedure: LEFT TOTAL KNEE ARTHROPLASTY;  Surgeon: Tobi Bastos, MD;  Location: [facility];  Service: Orthopedics;  Laterality: Left;    Family History  Problem Relation Age of Onset  . Breast cancer Mother   . Diabetes Maternal Grandfather   . Kidney disease      Both sides of family  . Heart disease      Both sides of family  . Colon cancer Brother 86  . Colon polyps Brother   .  Breast cancer Maternal Aunt   . Breast cancer Maternal Grandmother    Social History:  reports that she quit smoking about 16 months ago. Her smoking use included Cigarettes. She has a 10 pack-year smoking history. She quit smokeless tobacco use about 21 months ago. Her smokeless tobacco use included Snuff and Chew. She reports that she drinks about 0.6 ounces of alcohol per week. She reports that she does not use illicit drugs.  Allergies:  Allergies  Allergen Reactions  . Pneumococcal Vaccines Other (See Comments)    PP-23 vaccine; had BIG local red reaction with heat.   Jaclyn Prime [Ibandronate Sodium]     Jaw popping  . Ceclor [Cefaclor] Other (See Comments)    Reaction=burning all over  . Codeine Nausea And Vomiting  . Cortisone Hives    All over body  . Penicillins     Medications: I have reviewed the patient's current medications. Prior to Admission:  Current outpatient prescriptions: albuterol (PROVENTIL HFA;VENTOLIN  HFA) 108 (90 BASE) MCG/ACT inhaler, Inhale 2 puffs into the lungs every 4 (four) hours as needed for wheezing or shortness of breath. Take two puffs four times a day as needed, Disp: 1 Inhaler, Rfl: prn;   albuterol (PROVENTIL) (2.5 MG/3ML) 0.083% nebulizer solution, Take 3 mLs (2.5 mg total) by nebulization every 6 (six) hours as needed for wheezing. DX  491.9, Disp: 150 mL, Rfl: 12 ALPRAZolam (XANAX) 0.25 MG tablet, Take 0.25 mg by mouth every 8 (eight) hours as needed for anxiety., Disp: , Rfl: ;   aspirin EC 81 MG tablet, Take 81 mg by mouth daily., Disp: , Rfl: ;   atorvastatin (LIPITOR) 40 MG tablet, Take 1 tablet (40 mg total) by mouth at bedtime., Disp: 30 tablet, Rfl: 0;  calcium carbonate (OS-CAL) 600 MG TABS, Take 600 mg by mouth daily. , Disp: , Rfl:  Cholecalciferol (VITAMIN D) 1000 UNITS capsule, Take 1,000 Units by mouth every morning. , Disp: , Rfl: ;   dexlansoprazole (DEXILANT) 60 MG capsule, Take 1 capsule (60 mg total) by mouth every morning., Disp: 30 capsule, Rfl: 5;   ferrous sulfate 325 (65 FE) MG tablet, Take 325 mg by mouth daily with breakfast., Disp: , Rfl: ;  fluticasone (FLONASE) 50 MCG/ACT nasal spray, Place 2 sprays into the nose daily., Disp: 16 g, Rfl: prn furosemide (LASIX) 40 MG tablet, Take 40 mg by mouth every morning., Disp: , Rfl: ;   metFORMIN (GLUCOPHAGE) 500 MG tablet, Take 1 tablet (500 mg total) by mouth daily. Takes at bedtime., Disp: 30 tablet, Rfl: 0;  mometasone-formoterol (DULERA) 100-5 MCG/ACT AERO, 2 puffs then rinse mouth twice daily maintenance controller, Disp: 1 Inhaler, Rfl: PARoxetine (PAXIL) 20 MG tablet, Take 20 mg by mouth daily., Disp: , Rfl:  vitamin B-12 (CYANOCOBALAMIN) 1000 MCG tablet, Take 1,000 mcg by mouth daily. , Disp: , Rfl:   ROS: History obtained from the patient  General ROS: negative for - chills, fatigue, fever, night sweats, weight gain or weight loss Psychological ROS: negative for - behavioral disorder, hallucinations,  memory difficulties, mood swings or suicidal ideation Ophthalmic ROS: negative for - blurry vision, double vision, eye pain or loss of vision ENT ROS: negative for - epistaxis, nasal discharge, oral lesions, sore throat, tinnitus or vertigo Allergy and Immunology ROS: negative for - hives or itchy/watery eyes Hematological and Lymphatic ROS: negative for - bleeding problems, bruising or swollen lymph nodes Endocrine ROS: negative for - galactorrhea, hair pattern changes, polydipsia/polyuria or temperature intolerance Respiratory ROS: negative  for - cough, hemoptysis, shortness of breath or wheezing Cardiovascular ROS: chest pain Gastrointestinal ROS: negative for - abdominal pain, diarrhea, hematemesis, nausea/vomiting or stool incontinence Genito-Urinary ROS: negative for - dysuria, hematuria, incontinence or urinary frequency/urgency Musculoskeletal ROS: right knee pain, neck pain Neurological ROS: as noted in HPI Dermatological ROS: negative for rash and skin lesion changes  Physical Examination: Blood pressure 152/56, pulse 54, temperature 98.2 F (36.8 C), temperature source Oral, resp. rate 18, SpO2 99.00%.  Neurologic Examination: Mental Status: Alert, oriented, thought content appropriate.  Speech fluent without evidence of aphasia.  Able to follow 3 step commands without difficulty. Cranial Nerves: II: Discs flat bilaterally; Visual fields grossly normal, pupils equal, round, reactive to light and accommodation III,IV, VI: ptosis not present, extra-ocular motions intact bilaterally V,VII: decrease in the left NLF, facial light touch sensation decreased on the right splitting the midline VIII: hearing normal bilaterally IX,X: gag reflex present XI: bilateral shoulder shrug XII: midline tongue extension Motor: Right : Upper extremity   5/5    Left:     Upper extremity   5/5  Lower extremity   5/5     Lower extremity   5/5 Tone and bulk:normal tone throughout; no atrophy  noted Sensory: Pinprick and light touch decreased on the right, splitting the midline Deep Tendon Reflexes: 2+ and symmetric throughout Plantars: Right: downgoing   Left: downgoing Cerebellar: Tremor noted in the RUE.  No dysmetria noted with finger-to-nose or heel-to-shin testing bilaterally Gait: Unable to test due to chest pain CV: pulses palpable throughout     Laboratory Studies:  Basic Metabolic Panel:  Recent Labs Lab 10/26/13 0127  NA 138  K 4.1  CL 99  CO2 27  GLUCOSE 169*  BUN 11  CREATININE 0.66  CALCIUM 9.8    Liver Function Tests:  Recent Labs Lab 10/26/13 0128  AST 26  ALT 34  ALKPHOS 83  BILITOT <0.2*  PROT 7.1  ALBUMIN 4.0   No results found for this basename: LIPASE, AMYLASE,  in the last 168 hours No results found for this basename: AMMONIA,  in the last 168 hours  CBC:  Recent Labs Lab 10/26/13 0127 10/26/13 0134  WBC 6.4  --   NEUTROABS  --  3.0  HGB 12.8  --   HCT 37.2  --   MCV 90.3  --   PLT 216  --     Cardiac Enzymes: No results found for this basename: CKTOTAL, CKMB, CKMBINDEX, TROPONINI,  in the last 168 hours  BNP: No components found with this basename: POCBNP,   CBG: No results found for this basename: GLUCAP,  in the last 168 hours  Microbiology: Results for orders placed during the hospital encounter of 01/08/13  SURGICAL PCR SCREEN     Status: None   Collection Time    01/08/13  2:30 PM      Result Value Ref Range Status   MRSA, PCR NEGATIVE  NEGATIVE Final   Staphylococcus aureus NEGATIVE  NEGATIVE Final   Comment:            The Xpert SA Assay (FDA     approved for NASAL specimens     in patients over 52 years of age),     is one component of     a comprehensive surveillance     program.  Test performance has     been validated by [employer] for patients greater     than or equal  to 89 year old.     It is not intended     to diagnose infection nor to     guide or monitor treatment.     Coagulation Studies:  Recent Labs  10/26/13 0239  LABPROT 12.6  INR 0.96    Urinalysis: No results found for this basename: COLORURINE, APPERANCEUR, LABSPEC, PHURINE, GLUCOSEU, HGBUR, BILIRUBINUR, KETONESUR, PROTEINUR, UROBILINOGEN, NITRITE, LEUKOCYTESUR,  in the last 168 hours  Lipid Panel:    Component Value Date/Time   TRIG 147 12/07/2012 1445   LDLCALC 60 12/07/2012 1445    HgbA1C:  Lab Results  Component Value Date   HGBA1C 7.3  % 12/07/2012    Urine Drug Screen:     Component Value Date/Time   LABOPIA NONE DETECTED 09/28/2008 2319   COCAINSCRNUR NONE DETECTED 09/28/2008 2319   LABBENZ NONE DETECTED 09/28/2008 2319   AMPHETMU NONE DETECTED 09/28/2008 2319   THCU NONE DETECTED 09/28/2008 2319   LABBARB  Value: NONE DETECTED        DRUG SCREEN FOR MEDICAL PURPOSES ONLY.  IF CONFIRMATION IS NEEDED FOR ANY PURPOSE, NOTIFY LAB WITHIN 5 DAYS.        LOWEST DETECTABLE LIMITS FOR URINE DRUG SCREEN Drug Class       Cutoff (ng/mL) Amphetamine      1000 Barbiturate      200 Benzodiazepine   182 Tricyclics       993 Opiates          300 Cocaine          300 THC              50 09/28/2008 2319    Alcohol Level: No results found for this basename: ETH,  in the last 168 hours  Other results: EKG: sinus rhythm at 53 bpm.  Imaging: Ct Head Wo Contrast  10/26/2013   CLINICAL DATA:  Dizziness and posterior headache  EXAM: CT HEAD WITHOUT CONTRAST  TECHNIQUE: Contiguous axial images were obtained from the base of the skull through the vertex without intravenous contrast.  COMPARISON:  10/04/2007  FINDINGS: Skull and Sinuses:Mucosal thickening in the bilateral paranasal sinuses with secretion retention within the left sphenoid.  Orbits: Bilateral cataract resection.  Brain: No evidence of acute abnormality, such as acute infarction, hemorrhage, hydrocephalus, or mass lesion/mass effect.  IMPRESSION: 1. No acute intracranial findings. 2. Sinusitis without acute effusions.   Electronically Signed    By: Jorje Guild M.D.   On: 10/26/2013 03:12    Assessment: 63 y.o. female presenting with complaints of difficulty with gait and worsening RUE tremor.  Head CT reviewed and shows no acute changes.  There is the question as to whether the patient may have had a posterior circulation event.  Further work up recommended.    Stroke Risk Factors - diabetes mellitus, hyperlipidemia and hypertension  Plan: 1. HgbA1c, fasting lipid panel 2. MRI, MRA  of the brain without contrast.  Remaining stroke work up not indicated unless imaging diagnostic of an acute ischemic event.   3. Prophylactic therapy-Continue ASA 4. Telemetry monitoring 5. Frequent neuro checks  Case discussed with Dr. Orvan Falconer, MD [facility] [phone] 10/26/2013, 3:21 AM

## 2013-10-26 NOTE — Progress Notes (Signed)
Utilization review completed.  P.J. Hau Sanor,RN,BSN Case Manager 

## 2013-11-04 ENCOUNTER — Ambulatory Visit (HOSPITAL_COMMUNITY)
Admission: RE | Admit: 2013-11-04 | Discharge: 2013-11-04 | Disposition: A | Discharge status: Home / Self Care | Payer: Medicaid Other | Source: Ambulatory Visit | Attending: Surgery | Admitting: Surgery

## 2013-11-04 ENCOUNTER — Other Ambulatory Visit (HOSPITAL_COMMUNITY): Payer: Self-pay | Admitting: Internal Medicine

## 2013-11-04 DIAGNOSIS — G459 Transient cerebral ischemic attack, unspecified: Secondary | ICD-10-CM

## 2013-11-06 ENCOUNTER — Ambulatory Visit (INDEPENDENT_AMBULATORY_CARE_PROVIDER_SITE_OTHER): Payer: Medicaid Other | Admitting: Internal Medicine

## 2013-11-06 ENCOUNTER — Encounter: Payer: Self-pay | Admitting: Internal Medicine

## 2013-11-06 VITALS — BP 112/70 | HR 64 | Ht 60.0 in | Wt 181.0 lb

## 2013-11-06 DIAGNOSIS — J42 Unspecified chronic bronchitis: Secondary | ICD-10-CM

## 2013-11-06 DIAGNOSIS — J302 Other seasonal allergic rhinitis: Secondary | ICD-10-CM

## 2013-11-06 DIAGNOSIS — F17200 Nicotine dependence, unspecified, uncomplicated: Secondary | ICD-10-CM

## 2013-11-06 DIAGNOSIS — Z720 Tobacco use: Secondary | ICD-10-CM

## 2013-11-06 DIAGNOSIS — J3089 Other allergic rhinitis: Secondary | ICD-10-CM

## 2013-11-06 DIAGNOSIS — J309 Allergic rhinitis, unspecified: Secondary | ICD-10-CM

## 2013-11-06 DIAGNOSIS — J209 Acute bronchitis, unspecified: Secondary | ICD-10-CM

## 2013-11-06 MED ORDER — ALBUTEROL SULFATE HFA 108 (90 BASE) MCG/ACT IN AERS: 2.0000 | INHALATION_SPRAY | RESPIRATORY_TRACT | Status: DC | PRN

## 2013-11-06 MED ORDER — AZITHROMYCIN 250 MG PO TABS: ORAL_TABLET | ORAL | Status: DC

## 2013-11-06 NOTE — Patient Instructions (Signed)
Script sent for albuterol HFA rescue inhaler to use if needed, when you are away from your nebulizer machine  Script sent for Zpak to treat sinus infection and bronchitis

## 2013-11-06 NOTE — Progress Notes (Signed)
07/24/12- 80 yoF former smoker (06/24/12), former patient at old office,  Ref By Dr. Myers/ [facility] -- pt reports having prod cough w thick green mucus, pnd, chest tightness x4 weeks occuring mostly w weather change She reports cough and chest congestion off and on for years. Says she just stopped smoking in November of 2013. Now green sputum for 2 or 3 weeks with scratchy throat but no fever, blood or pain. Some cough even on good days. Occasional wheeze and she uses her rescue inhaler between 3 and 6 times daily. History of an allergic component and was on allergy vaccine years ago. Reports urticaria last week after steroid injection in her knee and shoulder, and also had flu vaccine about the same time. She treated herself with Benadryl successfully. She remains on Benadryl 50 mg twice daily. Has had hives before and "nervous". Several episodes of pneumonia. Aware of GERD symptoms despite Dexilant. CXR- 02/22/12 image reviewed IMPRESSION:  Negative exam.  Original Report Authenticated By: [name], M.D.   09/04/12- 31 yoF former smoker (06/24/12), former patient at old office,  Follows For: 4 week f/u - Non prod cough - Sinus and chest congestion - SOB worse with weather changes - Chest tightness and wheezing - Out of Advair for 2 weeks Advair sample helped. Dry cough. Postnasal drip with yellow nasal discharge. Hives resolved. Nebulizer does help. CXR 09/04/12 IMPRESSION:  No acute disease.  Original Report Authenticated By: [name], M.D.  11/05/12- 9 yoF former smoker (06/24/12), former patient at old office, followed for chronic bronchitis. FOLLOWS FOR: PFTs today-- pt reports breathing is "slightly worse" d/t weather, gets SOB easier, prod cough at times-- denies any other concerns at this time [facility] sample worked well instead of advair. LOV nebulizer and depomedrol helped " a bunch", as did Tessalon for cough. Still smoking 4-5 cigarettes/ day and resists blaming this for any  airway problems. [place]. Also blames oil heat and lack of airconditioning in her home. Asks letter stating those are aggravating her breathing. Says she looks for opportunities to be out of house. CXR 09/04/12 IMPRESSION:  No acute disease.  Original Report Authenticated By: [name], M.D. PFT- 11/05/12- Normal except mild reduction of DLCO. No response to bronchodilator. FVC 2.17/ 83%, FEV1 1.76/ 91%, FEV1/FVC 0.81, TLC 93%, DLCO 68%.  05/07/13- 28 yoF former smoker (06/24/12), former patient at old office, followed for chronic bronchitis FOLLOWS JIR:CVELF more sob since last night,chest tightness mid chest, cough at hs -yellow green,denies wheezing and chest pain,says Dulera worked well,needs refills for [employer] Mostly coughs with colds. Admits he still smokes an occasional cigarette and we discussed smoking cessation.  11/06/13-  46 yoF former smoker (06/24/12), former patient at old office, followed for chronic bronchitis, complicated by DM2, hx CVA FOLLOWS FOR: Breathing is worse since season changed. Pollen is making things worse. Reports SOB, coughing with production of yellow/green mucus. 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 Chest tight.  ROS-see HPI Constitutional:   No-   weight loss, night sweats, fevers, chills, fatigue, lassitude. HEENT:   +  headaches, difficulty swallowing, tooth/dental problems, sore throat,       No-  Sneezing, itching, no-ear ache, nasal congestion, +post nasal drip,  CV:  No-   chest pain, orthopnea, PND, swelling in lower extremities, anasarca, dizziness, +palpitations Resp: +  shortness of breath with exertion or at rest.              No-   productive cough,  +  non-productive cough,  No- coughing up of blood.              No-   change in color of mucus.  + wheezing.   Skin: No- rash or lesions. GI:  No- heartburn, indigestion, no-abdominal pain, nausea, vomiting,  GU: . MS:  + joint pain or swelling.   Neuro-      nothing unusual Psych:  No- change in mood or affect. + depression or anxiety.  No memory loss.  OBJ- Physical Exam General- Alert, Oriented, Affect-appropriate, Distress- none acute. Overweight Skin- No rash Lymphadenopathy- none Head- atraumatic            Eyes- Gross vision intact, PERRLA, conjunctivae and secretions clear            Ears- Hearing, canals-normal            Nose- Clear, no-Septal dev, mucus, polyps, erosion, perforation             Throat- Mallampati II , mucosa clear , drainage- none, tonsils- atrophic Neck- flexible , trachea midline, no stridor , thyroid nl, carotid no bruit Chest - symmetrical excursion , unlabored           Heart/CV- RRR , no murmur , no gallop  , no rub, nl s1 s2                           - JVD- none , edema- none, stasis changes- none, varices- none           Lung-  Cough+barking , dullness-none, rub- none, wheeze-none           Chest wall-  Abd-  Br/ Gen/ Rectal- Not done, not indicated Extrem- cyanosis- none, clubbing, none, atrophy- none, strength- nl Neuro- grossly intact to observation

## 2013-12-04 NOTE — Assessment & Plan Note (Signed)
Continues in remission 

## 2013-12-04 NOTE — Assessment & Plan Note (Signed)
Seasonal exacerbation and possible acute sinusitis Plan- Zpak, saline rinse, disciussed antihistamines

## 2013-12-04 NOTE — Assessment & Plan Note (Signed)
Mild tightness blamed on pollen Refill rescue inhaler

## 2014-01-03 ENCOUNTER — Telehealth: Payer: Self-pay | Admitting: Internal Medicine

## 2014-01-03 MED ORDER — BENZONATATE 200 MG PO CAPS: 200.0000 mg | ORAL_CAPSULE | Freq: Three times a day (TID) | ORAL | Status: DC | PRN

## 2014-01-03 NOTE — Telephone Encounter (Signed)
Pt aware that we will send to [facility] to advise on refill.  Last filled in 2014; patient is current with OVs. Please advise if okay to refill. Thanks.

## 2014-01-03 NOTE — Telephone Encounter (Signed)
Ok to refill 

## 2014-01-03 NOTE — Telephone Encounter (Signed)
[facility], lmtcb X1 to make pt aware.

## 2014-01-06 NOTE — Telephone Encounter (Signed)
Pt aware. Sahar Ryback, CMA  

## 2014-03-04 ENCOUNTER — Telehealth: Payer: Self-pay | Admitting: Internal Medicine

## 2014-03-04 DIAGNOSIS — R053 Chronic cough: Secondary | ICD-10-CM

## 2014-03-04 DIAGNOSIS — R05 Cough: Secondary | ICD-10-CM

## 2014-03-04 NOTE — Telephone Encounter (Signed)
Ok order lab only- Allergy profile  Dx chronic cough

## 2014-03-04 NOTE — Telephone Encounter (Signed)
Pt states she has had a cough for a while now and would like to know if [facility] would run labs on her for allergy profile or IGE only as this is what he did for her brother. Pt is aware that [facility] is out of the office this week and will address this next week.

## 2014-03-05 NOTE — Telephone Encounter (Signed)
[phone] returning call

## 2014-03-05 NOTE — Telephone Encounter (Signed)
PT AWARE OF RECS. RX CALLEED IN

## 2014-03-05 NOTE — Telephone Encounter (Signed)
lmomtcb x1 

## 2014-03-07 ENCOUNTER — Other Ambulatory Visit: Payer: Self-pay | Admitting: General Practice

## 2014-03-18 ENCOUNTER — Other Ambulatory Visit: Payer: Medicaid Other

## 2014-03-18 ENCOUNTER — Telehealth: Payer: Self-pay | Admitting: Internal Medicine

## 2014-03-18 DIAGNOSIS — R053 Chronic cough: Secondary | ICD-10-CM

## 2014-03-18 DIAGNOSIS — R05 Cough: Secondary | ICD-10-CM

## 2014-03-18 NOTE — Telephone Encounter (Signed)
Called and spoke to pt. Pt stated she had the allergy lab collected at "short stay". I informed pt that the results are not currently in the system. Pt stated because she was with a relative at [facility] getting a xolair injections then she would go to the lab here to have it drawn. I informed pt that we would contact her once we have the results. Pt verbalized understanding and denied any further questions or concerns at this time.

## 2014-03-18 NOTE — Telephone Encounter (Signed)
Call pt on cell @ [phone].[name] [name]

## 2014-03-19 LAB — ALLERGY FULL PROFILE
Allergen, D pternoyssinus,d7: <0.1 kU/L
Alternaria Alternata: <0.1 kU/L
Bahia Grass: <0.1 kU/L
Bermuda Grass: <0.1 kU/L
Candida Albicans: <0.1 kU/L
Cat Dander: <0.1 kU/L
Common Ragweed: <0.1 kU/L
Curvularia lunata: <0.1 kU/L
D. farinae: <0.1 kU/L
Dog Dander: <0.1 kU/L
Elm IgE: <0.1 kU/L
G009 Red Top: <0.1 kU/L
Goldenrod: <0.1 kU/L
Helminthosporium halodes: <0.1 kU/L
House Dust Hollister: <0.1 kU/L
IgE (Immunoglobulin E), Serum: 29.8 IU/mL (ref 0.0–180.0)
Lamb's Quarters: <0.1 kU/L
Oak: <0.1 kU/L
Sycamore Tree: <0.1 kU/L
Timothy Grass: <0.1 kU/L

## 2014-04-01 ENCOUNTER — Telehealth: Payer: Self-pay | Admitting: Internal Medicine

## 2014-04-01 MED ORDER — VARENICLINE TARTRATE 0.5 MG X 11 & 1 MG X 42 PO MISC: ORAL | Status: DC

## 2014-04-01 NOTE — Telephone Encounter (Signed)
Dr Annamaria Boots, patient came by with her brother today in allergy-would like to see if we would send Rx for Chantix so she can have help to stop smoking as she knows this is harming her and causing her to have a cough all the time. Please advise if okay to send Chantix Rx. Thanks.

## 2014-04-01 NOTE — Telephone Encounter (Signed)
Ok to Rx Chantix. She probably needs starter kit, then call us when that is done to order maintenance kit. Suggest she aim to stop smoking after she has been on Chantix x 1 week.

## 2014-04-01 NOTE — Telephone Encounter (Signed)
Pt aware of recs. RX called in. Nothing further needed 

## 2014-04-03 ENCOUNTER — Encounter: Payer: Self-pay | Admitting: Internal Medicine

## 2014-04-16 ENCOUNTER — Encounter: Payer: Self-pay | Admitting: Internal Medicine

## 2014-05-07 ENCOUNTER — Other Ambulatory Visit: Payer: Self-pay | Admitting: Internal Medicine

## 2014-05-07 ENCOUNTER — Encounter: Payer: Self-pay | Admitting: Cardiovascular Disease

## 2014-05-07 ENCOUNTER — Ambulatory Visit (INDEPENDENT_AMBULATORY_CARE_PROVIDER_SITE_OTHER)
Admission: RE | Admit: 2014-05-07 | Discharge: 2014-05-07 | Disposition: A | Discharge status: Home / Self Care | Payer: Medicaid Other | Source: Ambulatory Visit | Attending: Internal Medicine | Admitting: Internal Medicine

## 2014-05-07 ENCOUNTER — Ambulatory Visit
Admission: RE | Admit: 2014-05-07 | Discharge: 2014-05-07 | Disposition: A | Discharge status: Home / Self Care | Payer: Self-pay | Source: Ambulatory Visit | Attending: Internal Medicine | Admitting: Internal Medicine

## 2014-05-07 ENCOUNTER — Encounter: Payer: Self-pay | Admitting: Internal Medicine

## 2014-05-07 ENCOUNTER — Ambulatory Visit (INDEPENDENT_AMBULATORY_CARE_PROVIDER_SITE_OTHER): Payer: Medicaid Other | Admitting: Cardiovascular Disease

## 2014-05-07 ENCOUNTER — Ambulatory Visit: Payer: Medicaid Other | Admitting: Internal Medicine

## 2014-05-07 VITALS — BP 138/84 | HR 56 | Ht 60.0 in | Wt 176.4 lb

## 2014-05-07 VITALS — BP 124/78 | HR 62 | Ht 60.5 in | Wt 179.0 lb

## 2014-05-07 DIAGNOSIS — R079 Chest pain, unspecified: Secondary | ICD-10-CM

## 2014-05-07 DIAGNOSIS — J441 Chronic obstructive pulmonary disease with (acute) exacerbation: Secondary | ICD-10-CM

## 2014-05-07 DIAGNOSIS — Z23 Encounter for immunization: Secondary | ICD-10-CM

## 2014-05-07 MED ORDER — LEVALBUTEROL HCL 0.63 MG/3ML IN NEBU
0.6300 mg | INHALATION_SOLUTION | Freq: Once | RESPIRATORY_TRACT | Status: AC
  Administered 2014-05-07: 0.63 mg via RESPIRATORY_TRACT

## 2014-05-07 MED ORDER — METHYLPREDNISOLONE ACETATE 80 MG/ML IJ SUSP
80.0000 mg | Freq: Once | INTRAMUSCULAR | Status: AC
  Administered 2014-05-07: 80 mg via INTRAMUSCULAR

## 2014-05-07 NOTE — Progress Notes (Signed)
[name] [name] Date of Birth  [birth year]       [facility]    [employer] [address], [place], [place]  [address]   [place], [place]  [address] [phone]     [phone]   Fax  [phone]    Fax [phone]  Problem List: 1. Noncardiac chest pain 2. Normal heart catheterization 2006 3. Hyperlipidemia 4. Left knee pain  History of Present Illness:  [name] [name] has done well. It's been over 2 years since last saw her in the office. She has a history of noncardiac chest pain. She's had a normal heart catheterization the past. She complains of having some palpitations and shortness of breath particularly when she exerts herself but she's not been able to do much in the way of exercise with her bad knee. She  has been able to do all her normal activities without any significant problems.  Oct. 2, 2014:  [name] [name] is doing well.  No CP. She had left knee replacement this past summer.   She is doing well.   She still has some chest tightness.  Oct. 7, 2015;  [name] [name] has had bilateral knee surgeries ( left TKA, and right arthroscopic surgery)   She is doing better.  Some occasional cp     Current Outpatient Prescriptions on File Prior to Visit  Medication Sig Dispense Refill  . albuterol (PROVENTIL HFA;VENTOLIN HFA) 108 (90 BASE) MCG/ACT inhaler Inhale 2 puffs into the lungs every 4 (four) hours as needed for wheezing or shortness of breath. Take two puffs four times a day as needed  1 Inhaler  prn  . albuterol (PROVENTIL) (2.5 MG/3ML) 0.083% nebulizer solution Take 3 mLs (2.5 mg total) by nebulization every 6 (six) hours as needed for wheezing. DX  491.9  150 mL  12  . ALPRAZolam (XANAX) 0.25 MG tablet Take 0.5 mg by mouth every 8 (eight) hours as needed for anxiety.       [name] ampicillin (PRINCIPEN) 250 MG capsule Take 250 mg by mouth 2 (two) times daily.      [name] aspirin EC 81 MG tablet Take 81 mg by mouth daily.      [name]  atorvastatin (LIPITOR) 40 MG tablet Take 1 tablet (40 mg total) by mouth at bedtime.  30 tablet  0  . benzonatate (TESSALON) 200 MG capsule Take 1 capsule (200 mg total) by mouth 3 (three) times daily as needed for cough.  30 capsule  2  . calcium carbonate (OS-CAL) 600 MG TABS Take 600 mg by mouth daily.       . Cholecalciferol (VITAMIN D) 1000 UNITS capsule Take 1,000 Units by mouth every morning.       [name] dexlansoprazole (DEXILANT) 60 MG capsule Take 1 capsule (60 mg total) by mouth every morning.  30 capsule  5  . ferrous sulfate 325 (65 FE) MG tablet Take 325 mg by mouth daily with breakfast.      . furosemide (LASIX) 40 MG tablet Take 40 mg by mouth every morning.      . Menthol (HONEY LEMON COUGH DROPS MT) Use as directed in the mouth or throat. Taking approx. 8-9 daily      . metFORMIN (GLUCOPHAGE) 500 MG tablet Take 1 tablet (500 mg total) by mouth daily. Takes at bedtime.  30 tablet  0  . mometasone-formoterol (DULERA) 100-5 MCG/ACT AERO 2 puffs then rinse mouth twice daily maintenance controller  1 Inhaler  prn  . PARoxetine (PAXIL) 20 MG tablet Take 20 mg by mouth daily.      . varenicline (CHANTIX PAK) 0.5 MG X 11 & 1 MG X 42 tablet Take 0.5 mg tab once daily x3days, then increase to 0.5mg  tab twice daily x4days, then increase to 1mg  tab twice daily  53 tablet  0  . vitamin B-12 (CYANOCOBALAMIN) 1000 MCG tablet Take 1,000 mcg by mouth daily.       [name] aspirin 325 MG EC tablet Take 81 mg by mouth daily.      . fluticasone (FLONASE) 50 MCG/ACT nasal spray Place 2 sprays into the nose daily.  16 g  prn   No current facility-administered medications on file prior to visit.    Allergies  Allergen Reactions  . Pneumococcal Vaccines Other (See Comments)    PP-23 vaccine; had BIG local red reaction with heat.   Jaclyn Prime [Ibandronate Sodium]     Jaw popping  . Ceclor [Cefaclor] Other (See Comments)    Reaction=burning all over  . Codeine Nausea And Vomiting  . Cortisone Hives    All  over body  . Penicillins     Past Medical History  Diagnosis Date  . Insomnia, unspecified   . Esophageal reflux   . Hyperlipemia   . Asthma   . Osteoporosis   . Arthritis   . COPD (chronic obstructive pulmonary disease)   . Depression   . Personal history of colonic polyps 09/2010    TUBULAR ADENOMAS (X3); NEGATIVE FOR HIGH GRADE DYSPLASIA OR MALIGNANCY.  . Barrett esophagus   . Hiatal hernia   . Esophageal stricture   . GERD (gastroesophageal reflux disease)   . GAD (generalized anxiety disorder)   . Aortic valve disorders   . Dysrhythmia     heart skips per pt   . Heart murmur   . Anxiety   . Shortness of breath     with exertion   . Pneumonia     hx of   . Type II or unspecified type diabetes mellitus without mention of complication, not stated as uncontrolled     on no meds   . Anemia     hx of years ago   . Hypertension 12/07/2012  . Myocardial infarction 01-08-13    '06-Chest pain-no stent-dx. MI-stress related  . 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 History of kidney stones 01-08-13    past hx.  . Transfusion history 01-08-13    past hx. many yrs ago    Past Surgical History  Procedure Laterality Date  . Cholecystectomy  2005  . Partial hysterectomy    . Shoulder surgery  2011  . Tubal ligation    . Tonsillectomy    . Foot surgery  2005    Left foot  . Wrist flexion tendon tenotomies and proximal corpectomy w/ wrist arthrodesis&iliac crest bone graft    . Cardiac catheterization      normal per Dr Acie Fredrickson in note dated 02/06/12   . Knee arthroscopy  02/28/2012    Procedure: ARTHROSCOPY KNEE;  Surgeon: Tobi Bastos, MD;  Location: [facility];  Service: Orthopedics;  Laterality: Left;  . Esophageal dilation  01-08-13    2 yrs ago  . Cataract extraction, bilateral    . 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 Former Smoker -- 0.25 packs/day for 40 years  . Types: Cigarettes  . Quit date: 06/24/2013  Smokeless tobacco  . Former Systems developer  . Types: Snuff, Chew  . Quit date: 12/31/2011    Comment: quit 3 months ago    History  Alcohol Use  . 0.6 oz/week  . 1 Cans of beer per week    Comment: occasionally    Family History  Problem Relation Age of Onset  . Breast cancer Mother   . Diabetes Maternal Grandfather   . Kidney disease      Both sides of family  . Heart disease      Both sides of family  . Colon cancer Brother 4  . Colon polyps Brother   . Breast cancer Maternal Aunt   . Breast cancer Maternal Grandmother     Reviw of Systems:  Reviewed in the HPI.  All other systems are negative.  Physical Exam: Blood pressure 138/84, pulse 56, height 5' (1.524 m), weight 176 lb 6.4 oz (80.015 kg). General: Well developed, well nourished, in no acute distress.  Head: Normocephalic, atraumatic, sclera non-icteric, mucus membranes are moist,   Neck: Supple. Carotids are 2 + without bruits. No JVD  Lungs: Clear bilaterally to auscultation.  Heart: regular rate.  normal  S1 S2. No murmurs, gallops or rubs.  Abdomen: Soft, non-tender, non-distended with normal bowel sounds. No hepatomegaly. No rebound/guarding. No masses.  Msk:  Strength and tone are normal  Extremities: No clubbing or cyanosis. No edema.  Distal pedal pulses are 2+ and equal bilaterally.  Neuro: Alert and oriented X 3. Moves all extremities spontaneously.  Psych:  Responds to questions appropriately with a normal affect.  ECG: Oct. 7, 2015:  Sinus brady at 56.  voltage for LVH  Assessment / Plan:

## 2014-05-07 NOTE — Patient Instructions (Signed)
Finish the antibiotic  Neb xopenex 0.63   Dx chronic bronchitis with acute exacerbation  Depo 58  Flu vax   Order- CXR    Dx chronic bronchitis with acute exacerbation

## 2014-05-07 NOTE — Progress Notes (Signed)
07/24/12- 20 yoF former smoker (06/24/12), former patient at old office,  Ref By Dr. Myers/ [facility] -- pt reports having prod cough w thick green mucus, pnd, chest tightness x4 weeks occuring mostly w weather change She reports cough and chest congestion off and on for years. Says she just stopped smoking in November of 2013. Now green sputum for 2 or 3 weeks with scratchy throat but no fever, blood or pain. Some cough even on good days. Occasional wheeze and she uses her rescue inhaler between 3 and 6 times daily. History of an allergic component and was on allergy vaccine years ago. Reports urticaria last week after steroid injection in her knee and shoulder, and also had flu vaccine about the same time. She treated herself with Benadryl successfully. She remains on Benadryl 50 mg twice daily. Has had hives before and "nervous". Several episodes of pneumonia. Aware of GERD symptoms despite Dexilant. CXR- 02/22/12 image reviewed IMPRESSION:  Negative exam.  Original Report Authenticated By: [name], M.D.   09/04/12- 13 yoF former smoker (06/24/12), former patient at old office,  Follows For: 4 week f/u - Non prod cough - Sinus and chest congestion - SOB worse with weather changes - Chest tightness and wheezing - Out of Advair for 2 weeks Advair sample helped. Dry cough. Postnasal drip with yellow nasal discharge. Hives resolved. Nebulizer does help. CXR 09/04/12 IMPRESSION:  No acute disease.  Original Report Authenticated By: [name], M.D.  11/05/12- 45 yoF former smoker (06/24/12), former patient at old office, followed for chronic bronchitis. FOLLOWS FOR: PFTs today-- pt reports breathing is "slightly worse" d/t weather, gets SOB easier, prod cough at times-- denies any other concerns at this time [facility] sample worked well instead of advair. LOV nebulizer and depomedrol helped " a bunch", as did Tessalon for cough. Still smoking 4-5 cigarettes/ day and resists blaming this for any  airway problems. [place]. Also blames oil heat and lack of airconditioning in her home. Asks letter stating those are aggravating her breathing. Says she looks for opportunities to be out of house. CXR 09/04/12 IMPRESSION:  No acute disease.  Original Report Authenticated By: [name], M.D. PFT- 11/05/12- Normal except mild reduction of DLCO. No response to bronchodilator. FVC 2.17/ 83%, FEV1 1.76/ 91%, FEV1/FVC 0.81, TLC 93%, DLCO 68%.  05/07/13- 54 yoF former smoker (06/24/12), former patient at old office, followed for chronic bronchitis FOLLOWS VHQ:IONGE more sob since last night,chest tightness mid chest, cough at hs -yellow green,denies wheezing and chest pain,says Dulera worked well,needs refills for [employer] Mostly coughs with colds. Admits he still smokes an occasional cigarette and we discussed smoking cessation.  11/06/13-  36 yoF former smoker (06/24/12), former patient at old office, followed for chronic bronchitis, complicated by DM2, hx CVA FOLLOWS FOR: Breathing is worse since season changed. Pollen is making things worse. Reports SOB, coughing with production of yellow/green mucus. 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 Chest tight.  05/07/14- 26 yoF former smoker (06/24/12), , followed for chronic bronchitis, allergic rhinitis complicated by DM2, hx CVA FOLLOW FOR: Chronic Bronchitis; currently on abx for inner ear infection; wants to discuss going on Singular for allergies;  sinus and chest congestion; non-productive cough    ROS-see HPI Constitutional:   No-   weight loss, night sweats, fevers, chills, fatigue, lassitude. HEENT:   +  headaches, difficulty swallowing, tooth/dental problems, sore throat,       No-  Sneezing, itching, no-ear ache, nasal congestion, +post nasal drip,  CV:  No-   chest pain, orthopnea, PND, swelling in lower extremities, anasarca, dizziness, +palpitations Resp: +  shortness of breath with exertion or at rest.               No-   productive cough,  + non-productive cough,  No- coughing up of blood.              No-   change in color of mucus.  + wheezing.   Skin: No- rash or lesions. GI:  No- heartburn, indigestion, no-abdominal pain, nausea, vomiting,  GU: . MS:  + joint pain or swelling.   Neuro-     nothing unusual Psych:  No- change in mood or affect. + depression or anxiety.  No memory loss.  OBJ- Physical Exam General- Alert, Oriented, Affect-appropriate, Distress- none acute. Overweight Skin- No rash Lymphadenopathy- none Head- atraumatic            Eyes- Gross vision intact, PERRLA, conjunctivae and secretions clear            Ears- Hearing, canals-normal            Nose- Clear, no-Septal dev, mucus, polyps, erosion, perforation             Throat- Mallampati II , mucosa clear , drainage- none, tonsils- atrophic Neck- flexible , trachea midline, no stridor , thyroid nl, carotid no bruit Chest - symmetrical excursion , unlabored           Heart/CV- RRR , no murmur , no gallop  , no rub, nl s1 s2                           - JVD- none , edema- none, stasis changes- none, varices- none           Lung-  Cough+barking , dullness-none, rub- none, wheeze-none           Chest wall-  Abd-  Br/ Gen/ Rectal- Not done, not indicated Extrem- cyanosis- none, clubbing, none, atrophy- none, strength- nl Neuro- grossly intact to observation

## 2014-05-07 NOTE — Patient Instructions (Signed)
Your physician recommends that you continue on your current medications as directed. Please refer to the Current Medication list given to you today.  Your physician wants you to follow-up in: 1 year with Dr. Nahser.  You will receive a reminder letter in the mail two months in advance. If you don't receive a letter, please call our office to schedule the follow-up appointment.  

## 2014-05-07 NOTE — Assessment & Plan Note (Signed)
She continues to have vague CP.  She has had 2 cardiac caths - neither showed any significant coronary irregularities.  I offered to do a stress myoview if she thinks the pains are more severe but she wanted to wait.   I will see her in 1 year.

## 2014-05-14 ENCOUNTER — Telehealth: Payer: Self-pay | Admitting: Internal Medicine

## 2014-05-14 NOTE — Telephone Encounter (Signed)
Singulair is not on pt current medication list.  Called pt. She reports Dr. Annamaria Boots was going start her on this medication and it was not sent to the pharmacy. Do not see this on AVS from last OV. Please advise Dr. Annamaria Boots thanks

## 2014-05-15 MED ORDER — MONTELUKAST SODIUM 10 MG PO TABS: 10.0000 mg | ORAL_TABLET | Freq: Every day | ORAL | Status: DC

## 2014-05-15 NOTE — Telephone Encounter (Signed)
ATC - Unable to leave message - Will try back - Rx for Singulair was sent to pharmacy.

## 2014-05-15 NOTE — Telephone Encounter (Signed)
Ok to add Singulair 10 mg, 1 daily, #30, ref prn

## 2014-05-16 NOTE — Telephone Encounter (Signed)
ATC, NA and not able to leave a msg

## 2014-05-19 NOTE — Telephone Encounter (Signed)
Unable to reach pt on cell # - automated message "person is not accepting phone call at this time." LM x 1 on home #

## 2014-05-19 NOTE — Telephone Encounter (Signed)
Spoke with pt - Pt confirmed she has received singulair rx sent to [facility].  Nothing further needed at this time.

## 2014-05-19 NOTE — Telephone Encounter (Signed)
atc pt but the message received stated that this person is not accepting calls at this time.  Will try  Back later.

## 2014-05-21 ENCOUNTER — Encounter (HOSPITAL_COMMUNITY): Payer: Self-pay

## 2014-05-21 ENCOUNTER — Ambulatory Visit (HOSPITAL_COMMUNITY)
Admission: RE | Admit: 2014-05-21 | Discharge: 2014-05-21 | Disposition: A | Discharge status: Home / Self Care | Payer: Medicaid Other | Source: Ambulatory Visit | Attending: Internal Medicine | Admitting: Internal Medicine

## 2014-05-21 DIAGNOSIS — Z7983 Long term (current) use of bisphosphonates: Secondary | ICD-10-CM | POA: Diagnosis not present

## 2014-05-21 MED ORDER — ZOLEDRONIC ACID 5 MG/100ML IV SOLN
5.0000 mg | Freq: Once | INTRAVENOUS | Status: AC
  Administered 2014-05-21: 5 mg via INTRAVENOUS
  Filled 2014-05-21: qty 100

## 2014-05-21 MED ORDER — SODIUM CHLORIDE 0.9 % IV SOLN
250.0000 mL | INTRAVENOUS | Status: DC
  Administered 2014-05-21: 250 mL via INTRAVENOUS

## 2014-05-21 NOTE — Discharge Instructions (Signed)

## 2014-05-28 ENCOUNTER — Telehealth: Payer: Self-pay | Admitting: Internal Medicine

## 2014-05-28 MED ORDER — EPINEPHRINE 0.3 MG/0.3ML IJ SOAJ: 0.3000 mg | Freq: Once | INTRAMUSCULAR | Status: DC

## 2014-05-28 NOTE — Telephone Encounter (Signed)
Epi-Pen refill sent to [facility] LM x 1 for pt to make aware Rx sent.

## 2014-05-29 NOTE — Telephone Encounter (Signed)
Patient is returning call.  [phone]

## 2014-05-29 NOTE — Telephone Encounter (Signed)
Lm x 2  For the pt.

## 2014-05-29 NOTE — Telephone Encounter (Signed)
I called pt. Made aware of below. Nothing further needed

## 2014-06-05 ENCOUNTER — Other Ambulatory Visit: Payer: Self-pay | Admitting: Surgical

## 2014-06-09 ENCOUNTER — Encounter (HOSPITAL_COMMUNITY): Payer: Self-pay

## 2014-06-11 ENCOUNTER — Encounter (HOSPITAL_COMMUNITY)
Admission: RE | Admit: 2014-06-11 | Discharge: 2014-06-11 | Disposition: A | Discharge status: Home / Self Care | Payer: Medicaid Other | Source: Ambulatory Visit | Attending: Orthopedic Surgery | Admitting: Orthopedic Surgery

## 2014-06-11 ENCOUNTER — Encounter (HOSPITAL_COMMUNITY): Payer: Self-pay

## 2014-06-11 DIAGNOSIS — Z01812 Encounter for preprocedural laboratory examination: Secondary | ICD-10-CM | POA: Diagnosis present

## 2014-06-11 DIAGNOSIS — Z01818 Encounter for other preprocedural examination: Secondary | ICD-10-CM

## 2014-06-11 HISTORY — DX: Frequency of micturition: R35.0

## 2014-06-11 LAB — COMPREHENSIVE METABOLIC PANEL
ALT: 23 U/L (ref 0–35)
AST: 17 U/L (ref 0–37)
Albumin: 3.8 g/dL (ref 3.5–5.2)
Alkaline Phosphatase: 75 U/L (ref 39–117)
Anion gap: 14 (ref 5–15)
BUN: 16 mg/dL (ref 6–23)
CO2: 22 mEq/L (ref 19–32)
Calcium: 9.6 mg/dL (ref 8.4–10.5)
Chloride: 96 mEq/L (ref 96–112)
Creatinine, Ser: 0.58 mg/dL (ref 0.50–1.10)
GFR calc Af Amer: >90 mL/min (ref >90)
GFR calc non Af Amer: >90 mL/min (ref >90)
Glucose, Bld: 136 mg/dL — ABNORMAL HIGH (ref 70–99)
Potassium: 4.1 mEq/L (ref 3.7–5.3)
Sodium: 132 mEq/L — ABNORMAL LOW (ref 137–147)
Total Bilirubin: <0.2 mg/dL — ABNORMAL LOW (ref 0.3–1.2)
Total Protein: 7 g/dL (ref 6.0–8.3)

## 2014-06-11 LAB — URINALYSIS, ROUTINE W REFLEX MICROSCOPIC
Bilirubin Urine: NEGATIVE
Glucose, UA: 100 mg/dL — AB
Hgb urine dipstick: NEGATIVE
Ketones, ur: NEGATIVE mg/dL
Leukocytes, UA: NEGATIVE
Nitrite: NEGATIVE
Protein, ur: NEGATIVE mg/dL
Specific Gravity, Urine: 1.024 (ref 1.005–1.030)
Urobilinogen, UA: 0.2 mg/dL (ref 0.0–1.0)
pH: 5.5 (ref 5.0–8.0)

## 2014-06-11 LAB — APTT: aPTT: 33 seconds (ref 24–37)

## 2014-06-11 LAB — PROTIME-INR
INR: 0.97 (ref 0.00–1.49)
Prothrombin Time: 12.9 seconds (ref 11.6–15.2)

## 2014-06-11 LAB — SURGICAL PCR SCREEN
MRSA, PCR: NEGATIVE
Staphylococcus aureus: NEGATIVE

## 2014-06-11 MED ORDER — CHLORHEXIDINE GLUCONATE 4 % EX LIQD
60.0000 mL | Freq: Once | CUTANEOUS | Status: DC
Start: 2014-06-11 — End: 2014-06-11

## 2014-06-11 MED ORDER — CHLORHEXIDINE GLUCONATE 4 % EX LIQD: 60.0000 mL | Freq: Once | CUTANEOUS | Status: DC

## 2014-06-11 NOTE — Pre-Procedure Instructions (Signed)
NOTE OF MEDICAL CLEARANCE ON CHART FROM DR. HOLWERDA-WITH OFFICE NOTE, EKG AND CBC REPORT  06-04-14. CMET, PT, PTT, UA, T/S, PCR WERE DONE TODAY PREOP AT [facility]. CXR REPORT IN EPIC FROM 05-07-14. PREOP CHART GIVEN TO FOLLOW UP NURSE SHARON SHOFFNER FOR REVIEW OF ALL PREOP TEST RESULTS.

## 2014-06-11 NOTE — Patient Instructions (Addendum)
YOUR SURGERY IS SCHEDULED AT [facility]  ON:  Thursday  11/19  REPORT TO  SHORT STAY CENTER AT:  10:45 AM   DO NOT EAT OR DRINK ANYTHING AFTER MIDNIGHT THE NIGHT BEFORE YOUR SURGERY.  YOU MAY BRUSH YOUR TEETH, RINSE OUT YOUR MOUTH--BUT NO WATER, NO FOOD, NO CHEWING GUM, NO MINTS, NO CANDIES, NO CHEWING TOBACCO.  PLEASE TAKE THE FOLLOWING MEDICATIONS THE AM OF YOUR SURGERY WITH A FEW SIPS OF WATER:  [name] [name], DEXILANT.  MAY USE YOUR FLONASE NASAL SPRAY; PLEASE USE YOUR INHALERS  ( DULERA  AND ALBUTEROL INHALERS )  AND NEBULIZER AM OF SURGERY BEFORE COMING TO HOSPITAL.  PLEASE BRING YOUR ALBUTEROL INHALER.   IF YOU ARE DIABETIC:  DO NOT TAKE ANY DIABETIC MEDICATIONS THE AM OF YOUR SURGERY.      DO NOT BRING VALUABLES, MONEY, CREDIT CARDS.  DO NOT WEAR JEWELRY, MAKE-UP, NAIL POLISH AND NO METAL PINS OR CLIPS IN YOUR HAIR. CONTACT LENS, DENTURES / PARTIALS, GLASSES SHOULD NOT BE WORN TO SURGERY AND IN MOST CASES-HEARING AIDS WILL NEED TO BE REMOVED.  BRING YOUR GLASSES CASE, ANY EQUIPMENT NEEDED FOR YOUR CONTACT LENS. FOR PATIENTS ADMITTED TO THE HOSPITAL--CHECK OUT TIME THE DAY OF DISCHARGE IS 11:00 AM.  ALL INPATIENT ROOMS ARE PRIVATE - WITH BATHROOM, TELEPHONE, TELEVISION AND WIFI INTERNET.    PLEASE BE AWARE THAT YOU MAY NEED ADDITIONAL BLOOD DRAWN DAY OF YOUR SURGERY  _______________________________________________________________________   [name] [name] - Preparing for Surgery Before surgery, you can play an important role.  Because skin is not sterile, your skin needs to be as free of germs as possible.  You can reduce the number of germs on your skin by washing with CHG (chlorahexidine gluconate) soap before surgery.  CHG is an antiseptic cleaner which kills germs and bonds with the skin to continue killing germs even after washing. Please DO NOT use if you have an allergy to CHG or antibacterial soaps.  If your skin becomes reddened/irritated stop using the CHG and  inform your nurse when you arrive at Short Stay. Do not shave (including legs and underarms) for at least 48 hours prior to the first CHG shower.  You may shave your face/neck. Please follow these instructions carefully:  1.  Shower with CHG Soap the night before surgery and the  morning of Surgery.  2.  If you choose to wash your hair, wash your hair first as usual with your  normal  shampoo.  3.  After you shampoo, rinse your hair and body thoroughly to remove the  shampoo.                           4.  Use CHG as you would any other liquid soap.  You can apply chg directly  to the skin and wash                       Gently with a scrungie or clean washcloth.  5.  Apply the CHG Soap to your body ONLY FROM THE NECK DOWN.   Do not use on face/ open                           Wound or open sores. Avoid contact with eyes, ears mouth and genitals (private parts).  Wash face,  Genitals (private parts) with your normal soap.             6.  Wash thoroughly, paying special attention to the area where your surgery  will be performed.  7.  Thoroughly rinse your body with warm water from the neck down.  8.  DO NOT shower/wash with your normal soap after using and rinsing off  the CHG Soap.                9.  Pat yourself dry with a clean towel.            10.  Wear clean pajamas.            11.  Place clean sheets on your bed the night of your first shower and do not  sleep with pets. Day of Surgery : Do not apply any lotions/deodorants the morning of surgery.  Please wear clean clothes to the hospital/surgery center.  FAILURE TO FOLLOW THESE INSTRUCTIONS MAY RESULT IN THE CANCELLATION OF YOUR SURGERY PATIENT SIGNATURE_________________________________  NURSE SIGNATURE__________________________________  ________________________________________________________________________   [name] [name]  An incentive spirometer is a tool that can help keep your lungs clear and  active. This tool measures how well you are filling your lungs with each breath. Taking long deep breaths may help reverse or decrease the chance of developing breathing (pulmonary) problems (especially infection) following:  A long period of time when you are unable to move or be active. BEFORE THE PROCEDURE   If the spirometer includes an indicator to show your best effort, your nurse or respiratory therapist will set it to a desired goal.  If possible, sit up straight or lean slightly forward. Try not to slouch.  Hold the incentive spirometer in an upright position. INSTRUCTIONS FOR USE   Sit on the edge of your bed if possible, or sit up as far as you can in bed or on a chair.  Hold the incentive spirometer in an upright position.  Breathe out normally.  Place the mouthpiece in your mouth and seal your lips tightly around it.  Breathe in slowly and as deeply as possible, raising the piston or the ball toward the top of the column.  Hold your breath for 3-5 seconds or for as long as possible. Allow the piston or ball to fall to the bottom of the column.  Remove the mouthpiece from your mouth and breathe out normally.  Rest for a few seconds and repeat Steps 1 through 7 at least 10 times every 1-2 hours when you are awake. Take your time and take a few normal breaths between deep breaths.  The spirometer may include an indicator to show your best effort. Use the indicator as a goal to work toward during each repetition.  After each set of 10 deep breaths, practice coughing to be sure your lungs are clear. If you have an incision (the cut made at the time of surgery), support your incision when coughing by placing a pillow or rolled up towels firmly against it. Once you are able to get out of bed, walk around indoors and cough well. You may stop using the incentive spirometer when instructed by your caregiver.  RISKS AND COMPLICATIONS  Take your time so you do not get dizzy or  light-headed.  If you are in pain, you may need to take or ask for pain medication before doing incentive spirometry. It is harder to take a deep breath if you are having  pain. AFTER USE  Rest and breathe slowly and easily.  It can be helpful to keep track of a log of your progress. Your caregiver can provide you with a simple table to help with this. If you are using the spirometer at home, follow these instructions: [place] IF:   You are having difficultly using the spirometer.  You have trouble using the spirometer as often as instructed.  Your pain medication is not giving enough relief while using the spirometer.  You develop fever of 100.5 F (38.1 C) or higher. SEEK IMMEDIATE MEDICAL CARE IF:   You cough up bloody sputum that had not been present before.  You develop fever of 102 F (38.9 C) or greater.  You develop worsening pain at or near the incision site. MAKE SURE YOU:   Understand these instructions.  Will watch your condition.  Will get help right away if you are not doing well or get worse. Document Released: 11/28/2006 Document Revised: 10/10/2011 Document Reviewed: 01/29/2007 Exit[employer] Patient Information 2014 Exit[employer], [place].   ________________________________________________________________________  WHAT IS A BLOOD TRANSFUSION? Blood Transfusion Information  A transfusion is the replacement of blood or some of its parts. Blood is made up of multiple cells which provide different functions.  Red blood cells carry oxygen and are used for blood loss replacement.  White blood cells fight against infection.  Platelets control bleeding.  Plasma helps clot blood.  Other blood products are available for specialized needs, such as hemophilia or other clotting disorders. BEFORE THE TRANSFUSION  Who gives blood for transfusions?   Healthy volunteers who are fully evaluated to make sure their blood is safe. This is blood bank  blood. Transfusion therapy is the safest it has ever been in the practice of medicine. Before blood is taken from a donor, a complete history is taken to make sure that person has no history of diseases nor engages in risky social behavior (examples are intravenous drug use or sexual activity with multiple partners). The donor's travel history is screened to minimize risk of transmitting infections, such as malaria. The donated blood is tested for signs of infectious diseases, such as HIV and hepatitis. The blood is then tested to be sure it is compatible with you in order to minimize the chance of a transfusion reaction. If you or a relative donates blood, this is often done in anticipation of surgery and is not appropriate for emergency situations. It takes many days to process the donated blood. RISKS AND COMPLICATIONS Although transfusion therapy is very safe and saves many lives, the main dangers of transfusion include:   Getting an infectious disease.  Developing a transfusion reaction. This is an allergic reaction to something in the blood you were given. Every precaution is taken to prevent this. The decision to have a blood transfusion has been considered carefully by your caregiver before blood is given. Blood is not given unless the benefits outweigh the risks. AFTER THE TRANSFUSION  Right after receiving a blood transfusion, you will usually feel much better and more energetic. This is especially true if your red blood cells have gotten low (anemic). The transfusion raises the level of the red blood cells which carry oxygen, and this usually causes an energy increase.  The nurse administering the transfusion will monitor you carefully for complications. HOME CARE INSTRUCTIONS  No special instructions are needed after a transfusion. You may find your energy is better. Speak with your caregiver about any limitations on activity for underlying diseases  you may have. SEEK MEDICAL CARE IF:    Your condition is not improving after your transfusion.  You develop redness or irritation at the intravenous (IV) site. SEEK IMMEDIATE MEDICAL CARE IF:  Any of the following symptoms occur over the next 12 hours:  Shaking chills.  You have a temperature by mouth above 102 F (38.9 C), not controlled by medicine.  Chest, back, or muscle pain.  People around you feel you are not acting correctly or are confused.  Shortness of breath or difficulty breathing.  Dizziness and fainting.  You get a rash or develop hives.  You have a decrease in urine output.  Your urine turns a dark color or changes to pink, red, or brown. Any of the following symptoms occur over the next 10 days:  You have a temperature by mouth above 102 F (38.9 C), not controlled by medicine.  Shortness of breath.  Weakness after normal activity.  The white part of the eye turns yellow (jaundice).  You have a decrease in the amount of urine or are urinating less often.  Your urine turns a dark color or changes to pink, red, or brown. Document Released: 07/15/2000 Document Revised: 10/10/2011 Document Reviewed: 03/03/2008 [facility] Patient Information 2014 [place], [place].  _______________________________________________________________________

## 2014-06-14 ENCOUNTER — Other Ambulatory Visit: Payer: Self-pay | Admitting: Internal Medicine

## 2014-06-17 ENCOUNTER — Other Ambulatory Visit: Payer: Self-pay | Admitting: Surgical

## 2014-06-17 ENCOUNTER — Telehealth: Payer: Self-pay | Admitting: Internal Medicine

## 2014-06-17 MED ORDER — TRANEXAMIC ACID 100 MG/ML IV SOLN: 2000.0000 mg | Freq: Once | INTRAVENOUS | Status: AC

## 2014-06-17 NOTE — Telephone Encounter (Signed)
Ok to send Rx for Chantix maintenance kit. I wouldn't repeat the starter kit.

## 2014-06-17 NOTE — H&P (Signed)
TOTAL KNEE ADMISSION H&P  Patient is being admitted for right total knee arthroplasty.  Subjective:  Chief Complaint:right knee pain.  HPI: CHAMPAYNE [name] [name], 63 y.o. female, has a history of pain and functional disability in the right knee due to arthritis and has failed non-surgical conservative treatments for greater than 12 weeks to includeNSAID's and/or analgesics, corticosteriod injections and activity modification.  Onset of symptoms was gradual, starting 5 years ago with gradually worsening course since that time. The patient noted prior procedures on the knee to include  arthroscopy and menisectomy on the right knee(s).  Patient currently rates pain in the right knee(s) at 7 out of 10 with activity. Patient has night pain, worsening of pain with activity and weight bearing, pain that interferes with activities of daily living, pain with passive range of motion, crepitus and joint swelling.  Patient has evidence of periarticular osteophytes and joint space narrowing by imaging studies.  There is no active infection.  Patient Active Problem List   Diagnosis Date Noted  . Gait instability 10/26/2013  . Stroke 10/26/2013  . TIA (transient ischemic attack) 10/26/2013  . Tobacco user, hx of 05/20/2013  . Postoperative anemia due to acute blood loss 01/18/2013  . Osteoarthritis of left knee 01/17/2013  . Hypertension 12/07/2012  . Peripheral edema 12/07/2012  . Type II or unspecified type diabetes mellitus without mention of complication, not stated as uncontrolled 12/07/2012  . Chronic bronchitis with acute exacerbation 08/05/2012  . Seasonal and perennial allergic rhinitis 08/05/2012  . Urticaria 08/05/2012  . Chest pain 02/06/2012  . Hyperlipidemia 02/06/2012   Past Medical History  Diagnosis Date  . Insomnia, unspecified   . Esophageal reflux   . Hyperlipemia   . Asthma   . Osteoporosis   . Arthritis   . COPD (chronic obstructive pulmonary disease)   . Depression   .  Personal history of colonic polyps 09/2010    TUBULAR ADENOMAS (X3); NEGATIVE FOR HIGH GRADE DYSPLASIA OR MALIGNANCY.  . Barrett esophagus   . Hiatal hernia   . Esophageal stricture   . GERD (gastroesophageal reflux disease)   . GAD (generalized anxiety disorder)   . Aortic valve disorders   . Heart murmur   . Anxiety   . Shortness of breath     with exertion   . Pneumonia     hx of   . Type II or unspecified type diabetes mellitus without mention of complication, not stated as uncontrolled     on no meds   . Anemia     hx of years ago   . Hypertension 12/07/2012  . Myocardial infarction 01-08-13    '06-Chest pain-no stent-dx. MI-stress related  . History of kidney stones 01-08-13    past hx.  . Transfusion history 01-08-13    past hx. many yrs ago  . Dysrhythmia     heart skips per pt   . 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 Urinary frequency     AND INCONTINENCE    Past Surgical History  Procedure Laterality Date  . Cholecystectomy  2005  . Partial hysterectomy    . Shoulder surgery  2011  . Tubal ligation    . Tonsillectomy    . Foot surgery  2005    Left foot  . Wrist flexion tendon tenotomies and proximal corpectomy w/ wrist arthrodesis&iliac crest bone graft    . Cardiac catheterization      normal per Dr  Nahser in note dated 02/06/12   . Knee arthroscopy  02/28/2012    Procedure: ARTHROSCOPY KNEE;  Surgeon: Tobi Bastos, MD;  Location: [facility];  Service: Orthopedics;  Laterality: Left;  . Esophageal dilation  01-08-13    2 yrs ago  . Cataract extraction, bilateral    . Total knee arthroplasty Left 01/17/2013    Procedure: LEFT TOTAL KNEE ARTHROPLASTY;  Surgeon: Tobi Bastos, MD;  Location: [facility];  Service: Orthopedics;  Laterality: Left;     Current outpatient prescriptions:  albuterol (PROVENTIL HFA;VENTOLIN HFA) 108 (90 BASE) MCG/ACT inhaler, Inhale 2 puffs into the lungs every 4 (four) hours as needed for wheezing or shortness of  breath. Take two puffs four times a day as needed, Disp: 1 Inhaler, Rfl: prn;   albuterol (PROVENTIL) (2.5 MG/3ML) 0.083% nebulizer solution, Take 3 mLs (2.5 mg total) by nebulization every 6 (six) hours as needed for wheezing. DX  491.9, Disp: 150 mL, Rfl: 12 ALPRAZolam (XANAX) 0.5 MG tablet, Take 0.5 mg by mouth 3 (three) times daily., Disp: , Rfl: ;   aspirin EC 81 MG tablet, Take 81 mg by mouth every morning. , Disp: , Rfl: ;   atorvastatin (LIPITOR) 40 MG tablet, Take 1 tablet (40 mg total) by mouth at bedtime., Disp: 30 tablet, Rfl: 0;   benzonatate (TESSALON) 200 MG capsule, Take 1 capsule (200 mg total) by mouth 3 (three) times daily as needed for cough., Disp: 30 capsule, Rfl: 2 calcium carbonate (OS-CAL) 600 MG TABS, Take 600 mg by mouth at bedtime. , Disp: , Rfl: ;   Cholecalciferol (VITAMIN D) 1000 UNITS capsule, Take 1,000 Units by mouth at bedtime. , Disp: , Rfl: ;   dexlansoprazole (DEXILANT) 60 MG capsule, Take 1 capsule (60 mg total) by mouth every morning., Disp: 30 capsule, Rfl: 5;   docusate sodium (COLACE) 100 MG capsule, Take 100 mg by mouth 2 (two) times daily., Disp: , Rfl:  EPINEPHrine 0.3 mg/0.3 mL IJ SOAJ injection, Inject 0.3 mLs (0.3 mg total) into the muscle once. (Patient taking differently: Inject 0.3 mg into the muscle once. EPI PEN - PT ALLERGIC TO BEES), Disp: 1 Device, Rfl: 1;  ferrous sulfate 325 (65 FE) MG tablet, Take 325 mg by mouth 2 (two) times daily. , Disp: , Rfl:  fluticasone (FLONASE) 50 MCG/ACT nasal spray, Place 2 sprays into the nose daily. (Patient taking differently: Place 1 spray into the nose 2 (two) times daily. ), Disp: 16 g, Rfl: prn;  furosemide (LASIX) 40 MG tablet, Take 40 mg by mouth every morning., Disp: , Rfl: ;   Menthol (HONEY LEMON COUGH DROPS MT), Use as directed in the mouth or throat. Taking approx. 8-9 daily, Disp: , Rfl:  metFORMIN (GLUCOPHAGE) 500 MG tablet, Take 1 tablet (500 mg total) by mouth daily. Takes at bedtime. (Patient  taking differently: Take 500 mg by mouth 2 (two) times daily. ), Disp: 30 tablet, Rfl: 0;  mometasone-formoterol (DULERA) 100-5 MCG/ACT AERO, Inhale 1 puff into the lungs 2 (two) times daily., Disp: , Rfl: ;   montelukast (SINGULAIR) 10 MG tablet, Take 1 tablet (10 mg total) by mouth at bedtime., Disp: 30 tablet, Rfl: 11 PARoxetine (PAXIL) 20 MG tablet, Take 20 mg by mouth at bedtime. , Disp: , Rfl: ;   varenicline (CHANTIX CONTINUING MONTH PAK) 1 MG tablet, Take 1 mg by mouth 2 (two) times daily., Disp: , Rfl: ;   vitamin B-12 (CYANOCOBALAMIN) 1000 MCG tablet, Take 1,000 mcg by mouth  at bedtime. , Disp: , Rfl: ;   ALPRAZolam (XANAX) 0.25 MG tablet, Take 0.5 mg by mouth every 8 (eight) hours as needed for anxiety. , Disp: , Rfl:  ampicillin (PRINCIPEN) 250 MG capsule, Take 250 mg by mouth 2 (two) times daily., Disp: , Rfl: ;   CHANTIX STARTING MONTH PAK 0.5 MG X 11 & 1 MG X 42 tablet, TAKE AS DIRECTED, Disp: 53 tablet, Rfl: 0;   mometasone-formoterol (DULERA) 100-5 MCG/ACT AERO, 2 puffs then rinse mouth twice daily maintenance controller (Patient taking differently: Inhale 1 puff into the lungs 2 (two) times daily. ), Disp: 1 Inhaler, Rfl: prn  Allergies  Allergen Reactions  . Pneumococcal Vaccines Other (See Comments)    PP-23 vaccine; had BIG local red reaction with heat.   Jaclyn Prime [Ibandronate Sodium]     Jaw popping  . Ceclor [Cefaclor] Other (See Comments)    Reaction=burning all over  . Codeine Nausea And Vomiting  . Cortisone Hives    All over body  . Other     BEE STINGS- CLOSE OFF THROAT  . Penicillins     "CLOSES OFF MY BREATHING"    History  Substance Use Topics  . Smoking status: Former Smoker -- 0.25 packs/day for 40 years    Types: Cigarettes    Quit date: 06/24/2013  . Smokeless tobacco: Former Systems developer    Types: Snuff, Sarina Ser    Quit date: 12/31/2011     Comment: quit 3 months ago  . Alcohol Use: 0.6 oz/week    1 Cans of beer per week     Comment: occasionally     Family History  Problem Relation Age of Onset  . Breast cancer Mother   . Diabetes Maternal Grandfather   . Kidney disease      Both sides of family  . Heart disease      Both sides of family  . Colon cancer Brother 42  . Colon polyps Brother   . Breast cancer Maternal Aunt   . Breast cancer Maternal Grandmother      Review of Systems  Constitutional: Negative.   HENT: Negative.   Eyes: Negative.   Respiratory: Positive for cough and wheezing. Negative for hemoptysis, sputum production and shortness of breath.   Cardiovascular: Negative.   Gastrointestinal: Positive for heartburn. Negative for nausea, vomiting, abdominal pain, diarrhea, constipation, blood in stool and melena.  Genitourinary: Positive for frequency. Negative for dysuria, urgency, hematuria and flank pain.  Musculoskeletal: Positive for joint pain. Negative for myalgias, back pain, falls and neck pain.       Right knee pain  Skin: Negative.   Neurological: Negative.   Endo/Heme/Allergies: Positive for environmental allergies. Negative for polydipsia. Does not bruise/bleed easily.  Psychiatric/Behavioral: Negative.     Objective:  Physical Exam  Constitutional: She is oriented to person, place, and time. She appears well-developed. No distress.  Overweight  HENT:  Head: Normocephalic and atraumatic.  Right Ear: External ear normal.  Left Ear: External ear normal.  Nose: Nose normal.  Mouth/Throat: Oropharynx is clear and moist.  Eyes: Conjunctivae and EOM are normal.  Neck: Normal range of motion. Neck supple.  Cardiovascular: Normal rate, regular rhythm and intact distal pulses.   Murmur heard.  Systolic murmur is present with a grade of 3/6  Respiratory: Breath sounds normal. No respiratory distress. She has no wheezes.  GI: Soft. Bowel sounds are normal. She exhibits no distension. There is no tenderness.  Musculoskeletal:  Right hip: Normal.       Left hip: Normal.       Right knee: She  exhibits decreased range of motion and swelling. She exhibits no effusion and no erythema. Tenderness found. Medial joint line and lateral joint line tenderness noted.       Left knee: Normal.       Right lower leg: She exhibits no tenderness and no swelling.       Left lower leg: She exhibits no tenderness and no swelling.  Neurological: She is alert and oriented to person, place, and time. She has normal strength and normal reflexes. No sensory deficit.  Skin: No rash noted. She is not diaphoretic. No erythema.  Psychiatric: She has a normal mood and affect. Her behavior is normal.   Vitals  Weight: 170 lb Height: 60.5in Body Surface Area: 1.75 m Body Mass Index: 32.65 kg/m  Pulse: 64 (Regular)  BP: 134/72 (Sitting, Left Arm, Standard)   Imaging Review Plain radiographs demonstrate severe degenerative joint disease of the right knee(s). The overall alignment ismild varus. The bone quality appears to be good for age and reported activity level.  Assessment/Plan:  End stage arthritis, right knee   The patient history, physical examination, clinical judgment of the provider and imaging studies are consistent with end stage degenerative joint disease of the right knee(s) and total knee arthroplasty is deemed medically necessary. The treatment options including medical management, injection therapy arthroscopy and arthroplasty were discussed at length. The risks and benefits of total knee arthroplasty were presented and reviewed. The risks due to aseptic loosening, infection, stiffness, patella tracking problems, thromboembolic complications and other imponderables were discussed. The patient acknowledged the explanation, agreed to proceed with the plan and consent was signed. Patient is being admitted for inpatient treatment for surgery, pain control, PT, OT, prophylactic antibiotics, VTE prophylaxis, progressive ambulation and ADL's and discharge planning. The patient is planning to  be discharged home with home health services    Topical TXA  PCP: Dr. Lewis Moccasin at [facility], [place]

## 2014-06-17 NOTE — Telephone Encounter (Signed)
Called and spoke to pt. Pt is requesting refill on Chantix. Pt stated she just finished the starter kit and is requesting a refill. Pt was started on Chantix 04/01/14 by CY. Pt last seen on 05/07/2014.  CY please advise if ok to fill. Thanks.   Allergies  Allergen Reactions  . Pneumococcal Vaccines Other (See Comments)    PP-23 vaccine; had BIG local red reaction with heat.   Jaclyn Prime [Ibandronate Sodium]     Jaw popping  . Ceclor [Cefaclor] Other (See Comments)    Reaction=burning all over  . Codeine Nausea And Vomiting  . Cortisone Hives    All over body  . Other     BEE STINGS- CLOSE OFF THROAT  . Penicillins     "CLOSES OFF MY BREATHING"    Current Outpatient Prescriptions on File Prior to Visit  Medication Sig Dispense Refill  . albuterol (PROVENTIL HFA;VENTOLIN HFA) 108 (90 BASE) MCG/ACT inhaler Inhale 2 puffs into the lungs every 4 (four) hours as needed for wheezing or shortness of breath. Take two puffs four times a day as needed 1 Inhaler prn  . albuterol (PROVENTIL) (2.5 MG/3ML) 0.083% nebulizer solution Take 3 mLs (2.5 mg total) by nebulization every 6 (six) hours as needed for wheezing. DX  491.9 150 mL 12  . ALPRAZolam (XANAX) 0.25 MG tablet Take 0.5 mg by mouth every 8 (eight) hours as needed for anxiety.     . ALPRAZolam (XANAX) 0.5 MG tablet Take 0.5 mg by mouth 3 (three) times daily.    [name] ampicillin (PRINCIPEN) 250 MG capsule Take 250 mg by mouth 2 (two) times daily.    [name] aspirin EC 81 MG tablet Take 81 mg by mouth every morning.     [name] atorvastatin (LIPITOR) 40 MG tablet Take 1 tablet (40 mg total) by mouth at bedtime. 30 tablet 0  . benzonatate (TESSALON) 200 MG capsule Take 1 capsule (200 mg total) by mouth 3 (three) times daily as needed for cough. 30 capsule 2  . calcium carbonate (OS-CAL) 600 MG TABS Take 600 mg by mouth at bedtime.     . CHANTIX STARTING MONTH PAK 0.5 MG X 11 & 1 MG X 42 tablet TAKE AS DIRECTED 53 tablet 0  . Cholecalciferol (VITAMIN D) 1000  UNITS capsule Take 1,000 Units by mouth at bedtime.     [name] dexlansoprazole (DEXILANT) 60 MG capsule Take 1 capsule (60 mg total) by mouth every morning. 30 capsule 5  . docusate sodium (COLACE) 100 MG capsule Take 100 mg by mouth 2 (two) times daily.    [name] EPINEPHrine 0.3 mg/0.3 mL IJ SOAJ injection Inject 0.3 mLs (0.3 mg total) into the muscle once. (Patient taking differently: Inject 0.3 mg into the muscle once. EPI PEN - PT ALLERGIC TO BEES) 1 Device 1  . ferrous sulfate 325 (65 FE) MG tablet Take 325 mg by mouth 2 (two) times daily.     . fluticasone (FLONASE) 50 MCG/ACT nasal spray Place 2 sprays into the nose daily. (Patient taking differently: Place 1 spray into the nose 2 (two) times daily. ) 16 g prn  . furosemide (LASIX) 40 MG tablet Take 40 mg by mouth every morning.    . Menthol (HONEY LEMON COUGH DROPS MT) Use as directed in the mouth or throat. Taking approx. 8-9 daily    . metFORMIN (GLUCOPHAGE) 500 MG tablet Take 1 tablet (500 mg total) by mouth daily. Takes at bedtime. (Patient taking differently: Take 500 mg by mouth  2 (two) times daily. ) 30 tablet 0  . mometasone-formoterol (DULERA) 100-5 MCG/ACT AERO 2 puffs then rinse mouth twice daily maintenance controller (Patient taking differently: Inhale 1 puff into the lungs 2 (two) times daily. ) 1 Inhaler prn  . mometasone-formoterol (DULERA) 100-5 MCG/ACT AERO Inhale 1 puff into the lungs 2 (two) times daily.    . montelukast (SINGULAIR) 10 MG tablet Take 1 tablet (10 mg total) by mouth at bedtime. 30 tablet 11  . PARoxetine (PAXIL) 20 MG tablet Take 20 mg by mouth at bedtime.     . varenicline (CHANTIX CONTINUING MONTH PAK) 1 MG tablet Take 1 mg by mouth 2 (two) times daily.    . vitamin B-12 (CYANOCOBALAMIN) 1000 MCG tablet Take 1,000 mcg by mouth at bedtime.      No current facility-administered medications on file prior to visit.

## 2014-06-18 ENCOUNTER — Other Ambulatory Visit: Payer: Self-pay | Admitting: Internal Medicine

## 2014-06-18 LAB — TYPE AND SCREEN
ABO/RH(D): O POS
Antibody Screen: NEGATIVE

## 2014-06-18 NOTE — Telephone Encounter (Signed)
ATC pt, automated message stated "The person you are trying to reach is not accepting calls at this time.  Please try your call again later."  WCB.

## 2014-06-19 ENCOUNTER — Inpatient Hospital Stay (HOSPITAL_COMMUNITY): Payer: Medicaid Other | Admitting: Certified Registered Nurse Anesthetist

## 2014-06-19 ENCOUNTER — Inpatient Hospital Stay (HOSPITAL_COMMUNITY): Payer: Medicaid Other

## 2014-06-19 ENCOUNTER — Encounter (HOSPITAL_COMMUNITY): Payer: Self-pay | Admitting: *Deleted

## 2014-06-19 ENCOUNTER — Inpatient Hospital Stay (HOSPITAL_COMMUNITY)
Admission: RE | Admit: 2014-06-19 | Discharge: 2014-06-24 | DRG: 469 | Disposition: A | Discharge status: Rehabilitation Facility | Payer: Medicaid Other | Source: Ambulatory Visit | Attending: Internal Medicine | Admitting: Internal Medicine

## 2014-06-19 ENCOUNTER — Encounter (HOSPITAL_COMMUNITY)
Admission: RE | Disposition: A | Discharge status: Rehabilitation Facility | Payer: Medicaid Other | Source: Ambulatory Visit | Attending: Emergency Medicine

## 2014-06-19 DIAGNOSIS — E663 Overweight: Secondary | ICD-10-CM | POA: Diagnosis present

## 2014-06-19 DIAGNOSIS — E118 Type 2 diabetes mellitus with unspecified complications: Secondary | ICD-10-CM

## 2014-06-19 DIAGNOSIS — I2510 Atherosclerotic heart disease of native coronary artery without angina pectoris: Secondary | ICD-10-CM | POA: Diagnosis present

## 2014-06-19 DIAGNOSIS — K219 Gastro-esophageal reflux disease without esophagitis: Secondary | ICD-10-CM | POA: Diagnosis present

## 2014-06-19 DIAGNOSIS — I97791 Other intraoperative cardiac functional disturbances during other surgery: Secondary | ICD-10-CM | POA: Diagnosis not present

## 2014-06-19 DIAGNOSIS — M25561 Pain in right knee: Secondary | ICD-10-CM | POA: Diagnosis present

## 2014-06-19 DIAGNOSIS — J9601 Acute respiratory failure with hypoxia: Secondary | ICD-10-CM | POA: Diagnosis not present

## 2014-06-19 DIAGNOSIS — E785 Hyperlipidemia, unspecified: Secondary | ICD-10-CM | POA: Diagnosis present

## 2014-06-19 DIAGNOSIS — K5900 Constipation, unspecified: Secondary | ICD-10-CM | POA: Diagnosis present

## 2014-06-19 DIAGNOSIS — J449 Chronic obstructive pulmonary disease, unspecified: Secondary | ICD-10-CM | POA: Diagnosis present

## 2014-06-19 DIAGNOSIS — I420 Dilated cardiomyopathy: Secondary | ICD-10-CM

## 2014-06-19 DIAGNOSIS — G9340 Encephalopathy, unspecified: Secondary | ICD-10-CM | POA: Diagnosis not present

## 2014-06-19 DIAGNOSIS — I5181 Takotsubo syndrome: Secondary | ICD-10-CM

## 2014-06-19 DIAGNOSIS — F329 Major depressive disorder, single episode, unspecified: Secondary | ICD-10-CM | POA: Diagnosis present

## 2014-06-19 DIAGNOSIS — I480 Paroxysmal atrial fibrillation: Secondary | ICD-10-CM | POA: Diagnosis present

## 2014-06-19 DIAGNOSIS — Z6832 Body mass index (BMI) 32.0-32.9, adult: Secondary | ICD-10-CM

## 2014-06-19 DIAGNOSIS — I248 Other forms of acute ischemic heart disease: Secondary | ICD-10-CM | POA: Diagnosis present

## 2014-06-19 DIAGNOSIS — F419 Anxiety disorder, unspecified: Secondary | ICD-10-CM | POA: Diagnosis present

## 2014-06-19 DIAGNOSIS — E876 Hypokalemia: Secondary | ICD-10-CM | POA: Diagnosis not present

## 2014-06-19 DIAGNOSIS — J9690 Respiratory failure, unspecified, unspecified whether with hypoxia or hypercapnia: Secondary | ICD-10-CM | POA: Diagnosis present

## 2014-06-19 DIAGNOSIS — K2270 Barrett's esophagus without dysplasia: Secondary | ICD-10-CM | POA: Diagnosis present

## 2014-06-19 DIAGNOSIS — F17220 Nicotine dependence, chewing tobacco, uncomplicated: Secondary | ICD-10-CM | POA: Diagnosis present

## 2014-06-19 DIAGNOSIS — J811 Chronic pulmonary edema: Secondary | ICD-10-CM

## 2014-06-19 DIAGNOSIS — I69251 Hemiplegia and hemiparesis following other nontraumatic intracranial hemorrhage affecting right dominant side: Secondary | ICD-10-CM

## 2014-06-19 DIAGNOSIS — M1711 Unilateral primary osteoarthritis, right knee: Secondary | ICD-10-CM | POA: Diagnosis not present

## 2014-06-19 DIAGNOSIS — E871 Hypo-osmolality and hyponatremia: Secondary | ICD-10-CM | POA: Diagnosis not present

## 2014-06-19 DIAGNOSIS — G4700 Insomnia, unspecified: Secondary | ICD-10-CM | POA: Diagnosis present

## 2014-06-19 DIAGNOSIS — I2489 Other forms of acute ischemic heart disease: Secondary | ICD-10-CM

## 2014-06-19 DIAGNOSIS — J810 Acute pulmonary edema: Secondary | ICD-10-CM

## 2014-06-19 DIAGNOSIS — M810 Age-related osteoporosis without current pathological fracture: Secondary | ICD-10-CM | POA: Diagnosis present

## 2014-06-19 DIAGNOSIS — I4891 Unspecified atrial fibrillation: Secondary | ICD-10-CM

## 2014-06-19 DIAGNOSIS — R778 Other specified abnormalities of plasma proteins: Secondary | ICD-10-CM

## 2014-06-19 DIAGNOSIS — I5021 Acute systolic (congestive) heart failure: Secondary | ICD-10-CM | POA: Diagnosis present

## 2014-06-19 DIAGNOSIS — F411 Generalized anxiety disorder: Secondary | ICD-10-CM | POA: Diagnosis present

## 2014-06-19 DIAGNOSIS — I252 Old myocardial infarction: Secondary | ICD-10-CM

## 2014-06-19 DIAGNOSIS — E1165 Type 2 diabetes mellitus with hyperglycemia: Secondary | ICD-10-CM | POA: Diagnosis present

## 2014-06-19 DIAGNOSIS — D649 Anemia, unspecified: Secondary | ICD-10-CM | POA: Diagnosis present

## 2014-06-19 DIAGNOSIS — Z9289 Personal history of other medical treatment: Secondary | ICD-10-CM

## 2014-06-19 DIAGNOSIS — K449 Diaphragmatic hernia without obstruction or gangrene: Secondary | ICD-10-CM | POA: Diagnosis present

## 2014-06-19 DIAGNOSIS — R0902 Hypoxemia: Secondary | ICD-10-CM | POA: Insufficient documentation

## 2014-06-19 DIAGNOSIS — I959 Hypotension, unspecified: Secondary | ICD-10-CM | POA: Insufficient documentation

## 2014-06-19 DIAGNOSIS — Z01812 Encounter for preprocedural laboratory examination: Secondary | ICD-10-CM

## 2014-06-19 DIAGNOSIS — Z96651 Presence of right artificial knee joint: Secondary | ICD-10-CM

## 2014-06-19 DIAGNOSIS — I10 Essential (primary) hypertension: Secondary | ICD-10-CM | POA: Diagnosis present

## 2014-06-19 DIAGNOSIS — D696 Thrombocytopenia, unspecified: Secondary | ICD-10-CM | POA: Diagnosis not present

## 2014-06-19 DIAGNOSIS — R7989 Other specified abnormal findings of blood chemistry: Secondary | ICD-10-CM

## 2014-06-19 HISTORY — PX: TOTAL KNEE ARTHROPLASTY: SHX125

## 2014-06-19 LAB — CBC WITH DIFFERENTIAL/PLATELET
BASOS ABS: 0 10*3/uL (ref 0.0–0.1)
BASOS PCT: 0 % (ref 0–1)
EOS PCT: 2 % (ref 0–5)
Eosinophils Absolute: 0.3 10*3/uL (ref 0.0–0.7)
HEMATOCRIT: 42 % (ref 36.0–46.0)
Hemoglobin: 13.8 g/dL (ref 12.0–15.0)
Lymphocytes Relative: 26 % (ref 12–46)
Lymphs Abs: 4.8 10*3/uL — ABNORMAL HIGH (ref 0.7–4.0)
MCH: 30.6 pg (ref 26.0–34.0)
MCHC: 32.9 g/dL (ref 30.0–36.0)
MCV: 93.1 fL (ref 78.0–100.0)
MONO ABS: 0.9 10*3/uL (ref 0.1–1.0)
Monocytes Relative: 5 % (ref 3–12)
Neutro Abs: 12.2 10*3/uL — ABNORMAL HIGH (ref 1.7–7.7)
Neutrophils Relative %: 67 % (ref 43–77)
Platelets: 302 10*3/uL (ref 150–400)
RBC: 4.51 MIL/uL (ref 3.87–5.11)
RDW: 13.6 % (ref 11.5–15.5)
WBC: 18.2 10*3/uL — ABNORMAL HIGH (ref 4.0–10.5)

## 2014-06-19 LAB — COMPREHENSIVE METABOLIC PANEL
ALT: 73 U/L — AB (ref 0–35)
AST: 67 U/L — AB (ref 0–37)
Albumin: 3.4 g/dL — ABNORMAL LOW (ref 3.5–5.2)
Alkaline Phosphatase: 86 U/L (ref 39–117)
Anion gap: 18 — ABNORMAL HIGH (ref 5–15)
BILIRUBIN TOTAL: 0.3 mg/dL (ref 0.3–1.2)
BUN: 18 mg/dL (ref 6–23)
CHLORIDE: 97 meq/L (ref 96–112)
CO2: 22 meq/L (ref 19–32)
CREATININE: 0.92 mg/dL (ref 0.50–1.10)
Calcium: 8.2 mg/dL — ABNORMAL LOW (ref 8.4–10.5)
GFR calc non Af Amer: 66 mL/min — ABNORMAL LOW (ref >90)
GFR, EST AFRICAN AMERICAN: 76 mL/min — AB (ref >90)
Glucose, Bld: 448 mg/dL — ABNORMAL HIGH (ref 70–99)
Potassium: 3.6 mEq/L — ABNORMAL LOW (ref 3.7–5.3)
Sodium: 137 mEq/L (ref 137–147)
Total Protein: 6.5 g/dL (ref 6.0–8.3)

## 2014-06-19 LAB — BLOOD GAS, ARTERIAL
ACID-BASE DEFICIT: 4.8 mmol/L — AB (ref 0.0–2.0)
ACID-BASE DEFICIT: 3.1 mmol/L — AB (ref 0.0–2.0)
Bicarbonate: 22.2 mEq/L (ref 20.0–24.0)
Bicarbonate: 22.1 mEq/L (ref 20.0–24.0)
DRAWN BY: 232811
Drawn by: 330991
FIO2: 100 %
FIO2: 0.9 %
LHR: 20 {breaths}/min
LHR: 20 {breaths}/min
O2 Saturation: 97.4 %
O2 Saturation: 97.1 %
PATIENT TEMPERATURE: 98.1
PEEP/CPAP: 12 cmH2O
PEEP: 12 cmH2O
PRESSURE CONTROL: 13 cmH2O
Patient temperature: 37
TCO2: 20.1 mmol/L (ref 0–100)
TCO2: 19.6 mmol/L (ref 0–100)
pCO2 arterial: 50.6 mmHg — ABNORMAL HIGH (ref 35.0–45.0)
pCO2 arterial: 42 mmHg (ref 35.0–45.0)
pH, Arterial: 7.264 — ABNORMAL LOW (ref 7.350–7.450)
pH, Arterial: 7.34 — ABNORMAL LOW (ref 7.350–7.450)
pO2, Arterial: 119 mmHg — ABNORMAL HIGH (ref 80.0–100.0)
pO2, Arterial: 100 mmHg (ref 80.0–100.0)

## 2014-06-19 LAB — GLUCOSE, CAPILLARY
GLUCOSE-CAPILLARY: 213 mg/dL — AB (ref 70–99)
Glucose-Capillary: 135 mg/dL — ABNORMAL HIGH (ref 70–99)
Glucose-Capillary: 339 mg/dL — ABNORMAL HIGH (ref 70–99)
Glucose-Capillary: 284 mg/dL — ABNORMAL HIGH (ref 70–99)
Glucose-Capillary: 252 mg/dL — ABNORMAL HIGH (ref 70–99)

## 2014-06-19 LAB — CBC
HEMATOCRIT: 35.1 % — AB (ref 36.0–46.0)
Hemoglobin: 11.7 g/dL — ABNORMAL LOW (ref 12.0–15.0)
MCH: 30.7 pg (ref 26.0–34.0)
MCHC: 33.3 g/dL (ref 30.0–36.0)
MCV: 92.1 fL (ref 78.0–100.0)
PLATELETS: 192 10*3/uL (ref 150–400)
RBC: 3.81 MIL/uL — ABNORMAL LOW (ref 3.87–5.11)
RDW: 13.6 % (ref 11.5–15.5)
WBC: 7.3 10*3/uL (ref 4.0–10.5)

## 2014-06-19 LAB — POCT I-STAT 4, (NA,K, GLUC, HGB,HCT)
Glucose, Bld: 128 mg/dL — ABNORMAL HIGH (ref 70–99)
HCT: 34 % — ABNORMAL LOW (ref 36.0–46.0)
Hemoglobin: 11.6 g/dL — ABNORMAL LOW (ref 12.0–15.0)
Potassium: 3.2 mEq/L — ABNORMAL LOW (ref 3.7–5.3)
Sodium: 140 mEq/L (ref 137–147)

## 2014-06-19 LAB — CK TOTAL AND CKMB (NOT AT ARMC)
CK, MB: 3.3 ng/mL (ref 0.3–4.0)
RELATIVE INDEX: 2.2 (ref 0.0–2.5)
Total CK: 151 U/L (ref 7–177)

## 2014-06-19 LAB — MAGNESIUM: MAGNESIUM: 1.8 mg/dL (ref 1.5–2.5)

## 2014-06-19 LAB — PRO B NATRIURETIC PEPTIDE: Pro B Natriuretic peptide (BNP): 108 pg/mL (ref 0–125)

## 2014-06-19 LAB — PHOSPHORUS: Phosphorus: 6.7 mg/dL — ABNORMAL HIGH (ref 2.3–4.6)

## 2014-06-19 LAB — TROPONIN I: TROPONIN I: 0.67 ng/mL — AB (ref <0.30)

## 2014-06-19 SURGERY — ARTHROPLASTY, KNEE, TOTAL
Anesthesia: General | Site: Knee | Laterality: Right

## 2014-06-19 MED ORDER — HYDROMORPHONE HCL 1 MG/ML IJ SOLN
INTRAMUSCULAR | Status: DC | PRN
  Administered 2014-06-19 (×2): 0.5 mg via INTRAVENOUS

## 2014-06-19 MED ORDER — ONDANSETRON HCL 4 MG/2ML IJ SOLN
INTRAMUSCULAR | Status: AC
  Filled 2014-06-19: qty 2

## 2014-06-19 MED ORDER — THROMBIN 5000 UNITS EX SOLR
OROMUCOSAL | Status: DC | PRN
  Administered 2014-06-19: 14:00:00 via TOPICAL

## 2014-06-19 MED ORDER — INSULIN ASPART 100 UNIT/ML ~~LOC~~ SOLN: 2.0000 [IU] | SUBCUTANEOUS | Status: DC

## 2014-06-19 MED ORDER — NEOSTIGMINE METHYLSULFATE 10 MG/10ML IV SOLN
INTRAVENOUS | Status: AC
  Filled 2014-06-19: qty 1

## 2014-06-19 MED ORDER — HYDROMORPHONE HCL 1 MG/ML IJ SOLN: 1.0000 mg | INTRAMUSCULAR | Status: DC | PRN

## 2014-06-19 MED ORDER — ONDANSETRON HCL 4 MG/2ML IJ SOLN: 4.0000 mg | Freq: Four times a day (QID) | INTRAMUSCULAR | Status: DC | PRN

## 2014-06-19 MED ORDER — ROCURONIUM BROMIDE 100 MG/10ML IV SOLN
INTRAVENOUS | Status: AC
  Filled 2014-06-19: qty 1

## 2014-06-19 MED ORDER — MENTHOL 3 MG MT LOZG: 1.0000 | LOZENGE | OROMUCOSAL | Status: DC | PRN

## 2014-06-19 MED ORDER — CHLORHEXIDINE GLUCONATE 0.12 % MT SOLN
15.0000 mL | Freq: Two times a day (BID) | OROMUCOSAL | Status: DC
  Administered 2014-06-19 – 2014-06-24 (×9): 15 mL via OROMUCOSAL
  Filled 2014-06-19 (×11): qty 15

## 2014-06-19 MED ORDER — METHOCARBAMOL 1000 MG/10ML IJ SOLN: 500.0000 mg | Freq: Four times a day (QID) | INTRAVENOUS | Status: DC | PRN

## 2014-06-19 MED ORDER — LACTATED RINGERS IV SOLN: INTRAVENOUS | Status: DC

## 2014-06-19 MED ORDER — FENTANYL CITRATE 0.05 MG/ML IJ SOLN
25.0000 ug | INTRAMUSCULAR | Status: DC | PRN
  Administered 2014-06-19: 25 ug via INTRAVENOUS
  Administered 2014-06-19 (×2): 50 ug via INTRAVENOUS
  Administered 2014-06-19: 25 ug via INTRAVENOUS
  Filled 2014-06-19 (×4): qty 2

## 2014-06-19 MED ORDER — FENTANYL CITRATE 0.05 MG/ML IJ SOLN
INTRAMUSCULAR | Status: DC | PRN
  Administered 2014-06-19 (×2): 50 ug via INTRAVENOUS
  Administered 2014-06-19: 100 ug via INTRAVENOUS
  Administered 2014-06-19: 50 ug via INTRAVENOUS

## 2014-06-19 MED ORDER — FERROUS SULFATE 325 (65 FE) MG PO TABS: 325.0000 mg | ORAL_TABLET | Freq: Two times a day (BID) | ORAL | Status: DC

## 2014-06-19 MED ORDER — ESMOLOL HCL 10 MG/ML IV SOLN
INTRAVENOUS | Status: DC | PRN
  Administered 2014-06-19: 20 mg via INTRAVENOUS

## 2014-06-19 MED ORDER — TRANEXAMIC ACID 100 MG/ML IV SOLN
2000.0000 mg | INTRAVENOUS | Status: DC | PRN
  Administered 2014-06-19: 2000 mg via TOPICAL

## 2014-06-19 MED ORDER — FUROSEMIDE 40 MG PO TABS: 40.0000 mg | ORAL_TABLET | Freq: Every morning | ORAL | Status: DC

## 2014-06-19 MED ORDER — DEXAMETHASONE SODIUM PHOSPHATE 10 MG/ML IJ SOLN: INTRAMUSCULAR | Status: DC | PRN

## 2014-06-19 MED ORDER — VANCOMYCIN HCL IN DEXTROSE 1-5 GM/200ML-% IV SOLN: 1000.0000 mg | Freq: Two times a day (BID) | INTRAVENOUS | Status: DC

## 2014-06-19 MED ORDER — SODIUM CHLORIDE 0.9 % IJ SOLN
INTRAMUSCULAR | Status: DC | PRN
  Administered 2014-06-19: 20 mL via INTRAVENOUS

## 2014-06-19 MED ORDER — VANCOMYCIN HCL IN DEXTROSE 1-5 GM/200ML-% IV SOLN
INTRAVENOUS | Status: AC
  Filled 2014-06-19: qty 200

## 2014-06-19 MED ORDER — POLYETHYLENE GLYCOL 3350 17 G PO PACK: 17.0000 g | PACK | Freq: Every day | ORAL | Status: DC | PRN

## 2014-06-19 MED ORDER — METFORMIN HCL 500 MG PO TABS: 500.0000 mg | ORAL_TABLET | Freq: Every day | ORAL | Status: DC

## 2014-06-19 MED ORDER — VANCOMYCIN HCL IN DEXTROSE 1-5 GM/200ML-% IV SOLN
1000.0000 mg | INTRAVENOUS | Status: AC
  Administered 2014-06-19: 1000 mg via INTRAVENOUS

## 2014-06-19 MED ORDER — ROCURONIUM BROMIDE 100 MG/10ML IV SOLN
INTRAVENOUS | Status: DC | PRN
  Administered 2014-06-19 (×2): 30 mg via INTRAVENOUS

## 2014-06-19 MED ORDER — BUPIVACAINE LIPOSOME 1.3 % IJ SUSP
20.0000 mL | Freq: Once | INTRAMUSCULAR | Status: AC
  Administered 2014-06-19: 20 mL
  Filled 2014-06-19: qty 20

## 2014-06-19 MED ORDER — FUROSEMIDE 10 MG/ML IJ SOLN
INTRAMUSCULAR | Status: AC
  Filled 2014-06-19: qty 2

## 2014-06-19 MED ORDER — DOCUSATE SODIUM 100 MG PO CAPS
100.0000 mg | ORAL_CAPSULE | Freq: Two times a day (BID) | ORAL | Status: DC | PRN
Start: 2014-06-19 — End: 2014-06-19

## 2014-06-19 MED ORDER — BISACODYL 5 MG PO TBEC: 5.0000 mg | DELAYED_RELEASE_TABLET | Freq: Every day | ORAL | Status: DC | PRN

## 2014-06-19 MED ORDER — ACETAMINOPHEN 650 MG RE SUPP: 650.0000 mg | Freq: Four times a day (QID) | RECTAL | Status: DC | PRN

## 2014-06-19 MED ORDER — NEOSTIGMINE METHYLSULFATE 10 MG/10ML IV SOLN
INTRAVENOUS | Status: DC | PRN
  Administered 2014-06-19: 5 mg via INTRAVENOUS

## 2014-06-19 MED ORDER — ACETAMINOPHEN 325 MG PO TABS
650.0000 mg | ORAL_TABLET | Freq: Four times a day (QID) | ORAL | Status: DC | PRN
  Administered 2014-06-21 – 2014-06-23 (×4): 650 mg via ORAL
  Filled 2014-06-19 (×4): qty 2

## 2014-06-19 MED ORDER — ALBUTEROL SULFATE (2.5 MG/3ML) 0.083% IN NEBU: 2.5000 mg | INHALATION_SOLUTION | Freq: Four times a day (QID) | RESPIRATORY_TRACT | Status: DC | PRN

## 2014-06-19 MED ORDER — ALUM & MAG HYDROXIDE-SIMETH 200-200-20 MG/5ML PO SUSP: 30.0000 mL | ORAL | Status: DC | PRN

## 2014-06-19 MED ORDER — PROPOFOL 10 MG/ML IV BOLUS
INTRAVENOUS | Status: AC
  Filled 2014-06-19: qty 20

## 2014-06-19 MED ORDER — THROMBIN 5000 UNITS EX SOLR
CUTANEOUS | Status: AC
  Filled 2014-06-19: qty 5000

## 2014-06-19 MED ORDER — SUCCINYLCHOLINE CHLORIDE 20 MG/ML IJ SOLN
INTRAMUSCULAR | Status: DC | PRN
  Administered 2014-06-19: 100 mg via INTRAVENOUS

## 2014-06-19 MED ORDER — HYDROCODONE-ACETAMINOPHEN 5-325 MG PO TABS: 1.0000 | ORAL_TABLET | ORAL | Status: DC | PRN

## 2014-06-19 MED ORDER — ONDANSETRON HCL 4 MG PO TABS: 4.0000 mg | ORAL_TABLET | Freq: Four times a day (QID) | ORAL | Status: DC | PRN

## 2014-06-19 MED ORDER — RIVAROXABAN 10 MG PO TABS
10.0000 mg | ORAL_TABLET | Freq: Every day | ORAL | Status: DC
  Filled 2014-06-19: qty 1

## 2014-06-19 MED ORDER — VARENICLINE TARTRATE 0.5 MG X 11 & 1 MG X 42 PO MISC: 1.0000 | ORAL | Status: DC

## 2014-06-19 MED ORDER — MIDAZOLAM HCL 2 MG/2ML IJ SOLN
INTRAMUSCULAR | Status: AC
  Filled 2014-06-19: qty 2

## 2014-06-19 MED ORDER — FERROUS SULFATE 325 (65 FE) MG PO TABS: 325.0000 mg | ORAL_TABLET | Freq: Three times a day (TID) | ORAL | Status: DC

## 2014-06-19 MED ORDER — HYDROMORPHONE HCL 1 MG/ML IJ SOLN: 0.2500 mg | INTRAMUSCULAR | Status: DC | PRN

## 2014-06-19 MED ORDER — LIDOCAINE HCL (CARDIAC) 20 MG/ML IV SOLN
INTRAVENOUS | Status: DC | PRN
  Administered 2014-06-19: 100 mg via INTRAVENOUS

## 2014-06-19 MED ORDER — FLUTICASONE PROPIONATE 50 MCG/ACT NA SUSP
2.0000 | Freq: Every day | NASAL | Status: DC
  Filled 2014-06-19: qty 16

## 2014-06-19 MED ORDER — EPINEPHRINE HCL 0.1 MG/ML IJ SOSY
PREFILLED_SYRINGE | INTRAMUSCULAR | Status: DC | PRN
  Administered 2014-06-19 (×4): 10 ug via INTRAVENOUS
  Administered 2014-06-19: 30 ug via INTRAVENOUS

## 2014-06-19 MED ORDER — DOCUSATE SODIUM 50 MG/5ML PO LIQD
100.0000 mg | Freq: Two times a day (BID) | ORAL | Status: DC | PRN
  Filled 2014-06-19: qty 10

## 2014-06-19 MED ORDER — CELECOXIB 200 MG PO CAPS
200.0000 mg | ORAL_CAPSULE | Freq: Two times a day (BID) | ORAL | Status: DC
  Filled 2014-06-19: qty 1

## 2014-06-19 MED ORDER — HYDROMORPHONE HCL 2 MG/ML IJ SOLN
INTRAMUSCULAR | Status: AC
  Filled 2014-06-19: qty 1

## 2014-06-19 MED ORDER — FENTANYL CITRATE 0.05 MG/ML IJ SOLN
100.0000 ug | INTRAMUSCULAR | Status: DC | PRN
  Administered 2014-06-19: 100 ug via INTRAVENOUS
  Filled 2014-06-19: qty 2

## 2014-06-19 MED ORDER — IPRATROPIUM-ALBUTEROL 0.5-2.5 (3) MG/3ML IN SOLN
3.0000 mL | RESPIRATORY_TRACT | Status: DC
  Administered 2014-06-19 – 2014-06-20 (×4): 3 mL via RESPIRATORY_TRACT
  Filled 2014-06-19 (×4): qty 3

## 2014-06-19 MED ORDER — VARENICLINE TARTRATE 1 MG PO TABS: 1.0000 mg | ORAL_TABLET | Freq: Two times a day (BID) | ORAL | Status: DC

## 2014-06-19 MED ORDER — PROPOFOL 10 MG/ML IV BOLUS
INTRAVENOUS | Status: DC | PRN
  Administered 2014-06-19: 110 mg via INTRAVENOUS

## 2014-06-19 MED ORDER — LACTATED RINGERS IV SOLN
INTRAVENOUS | Status: DC
  Administered 2014-06-19: 17:00:00 via INTRAVENOUS

## 2014-06-19 MED ORDER — BUPIVACAINE HCL (PF) 0.25 % IJ SOLN
INTRAMUSCULAR | Status: AC
  Filled 2014-06-19: qty 30

## 2014-06-19 MED ORDER — FAMOTIDINE IN NACL 20-0.9 MG/50ML-% IV SOLN
20.0000 mg | Freq: Two times a day (BID) | INTRAVENOUS | Status: DC
  Filled 2014-06-19: qty 50

## 2014-06-19 MED ORDER — GLYCOPYRROLATE 0.2 MG/ML IJ SOLN
INTRAMUSCULAR | Status: AC
  Filled 2014-06-19: qty 3

## 2014-06-19 MED ORDER — FENTANYL CITRATE 0.05 MG/ML IJ SOLN
50.0000 ug | Freq: Once | INTRAMUSCULAR | Status: AC
  Administered 2014-06-20: 50 ug via INTRAVENOUS
  Filled 2014-06-19: qty 2

## 2014-06-19 MED ORDER — ALBUTEROL SULFATE HFA 108 (90 BASE) MCG/ACT IN AERS: 2.0000 | INHALATION_SPRAY | RESPIRATORY_TRACT | Status: DC | PRN

## 2014-06-19 MED ORDER — SODIUM CHLORIDE 0.9 % IR SOLN
Status: AC
  Filled 2014-06-19: qty 1

## 2014-06-19 MED ORDER — PANTOPRAZOLE SODIUM 40 MG IV SOLR
40.0000 mg | Freq: Every day | INTRAVENOUS | Status: DC
  Administered 2014-06-19: 40 mg via INTRAVENOUS
  Filled 2014-06-19: qty 40

## 2014-06-19 MED ORDER — LACTATED RINGERS IV SOLN
INTRAVENOUS | Status: DC
  Administered 2014-06-19: 1000 mL via INTRAVENOUS

## 2014-06-19 MED ORDER — HYDROMORPHONE HCL 2 MG PO TABS: 2.0000 mg | ORAL_TABLET | ORAL | Status: DC | PRN

## 2014-06-19 MED ORDER — CETYLPYRIDINIUM CHLORIDE 0.05 % MT LIQD
7.0000 mL | Freq: Four times a day (QID) | OROMUCOSAL | Status: DC
  Administered 2014-06-20 – 2014-06-24 (×13): 7 mL via OROMUCOSAL

## 2014-06-19 MED ORDER — FENTANYL CITRATE 0.05 MG/ML IJ SOLN
INTRAMUSCULAR | Status: AC
Start: 2014-06-19 — End: 2014-06-19
  Filled 2014-06-19: qty 5

## 2014-06-19 MED ORDER — PHENOL 1.4 % MT LIQD
1.0000 | OROMUCOSAL | Status: DC | PRN
  Filled 2014-06-19: qty 177

## 2014-06-19 MED ORDER — ALBUTEROL SULFATE HFA 108 (90 BASE) MCG/ACT IN AERS
INHALATION_SPRAY | RESPIRATORY_TRACT | Status: DC | PRN
  Administered 2014-06-19: 5 via RESPIRATORY_TRACT

## 2014-06-19 MED ORDER — FLEET ENEMA 7-19 GM/118ML RE ENEM: 1.0000 | ENEMA | Freq: Once | RECTAL | Status: DC | PRN

## 2014-06-19 MED ORDER — ALBUTEROL SULFATE HFA 108 (90 BASE) MCG/ACT IN AERS
INHALATION_SPRAY | RESPIRATORY_TRACT | Status: AC
  Filled 2014-06-19: qty 6.7

## 2014-06-19 MED ORDER — MIDAZOLAM HCL 2 MG/2ML IJ SOLN
1.0000 mg | INTRAMUSCULAR | Status: DC | PRN
  Administered 2014-06-19 (×2): 2 mg via INTRAVENOUS
  Administered 2014-06-19: 1 mg via INTRAVENOUS
  Filled 2014-06-19 (×2): qty 2

## 2014-06-19 MED ORDER — POTASSIUM CHLORIDE 20 MEQ/15ML (10%) PO SOLN
40.0000 meq | Freq: Three times a day (TID) | ORAL | Status: AC
  Administered 2014-06-19 (×2): 40 meq
  Filled 2014-06-19 (×2): qty 30

## 2014-06-19 MED ORDER — MIDAZOLAM HCL 5 MG/5ML IJ SOLN
INTRAMUSCULAR | Status: DC | PRN
  Administered 2014-06-19 (×2): 2 mg via INTRAVENOUS

## 2014-06-19 MED ORDER — LABETALOL HCL 5 MG/ML IV SOLN
INTRAVENOUS | Status: AC
  Filled 2014-06-19: qty 4

## 2014-06-19 MED ORDER — PANTOPRAZOLE SODIUM 40 MG PO TBEC: 40.0000 mg | DELAYED_RELEASE_TABLET | Freq: Every day | ORAL | Status: DC

## 2014-06-19 MED ORDER — SODIUM CHLORIDE 0.9 % IV SOLN
250.0000 mL | INTRAVENOUS | Status: DC | PRN
Start: 2014-06-19 — End: 2014-06-24
  Administered 2014-06-20: 250 mL via INTRAVENOUS

## 2014-06-19 MED ORDER — MONTELUKAST SODIUM 10 MG PO TABS: 10.0000 mg | ORAL_TABLET | Freq: Every day | ORAL | Status: DC

## 2014-06-19 MED ORDER — ATORVASTATIN CALCIUM 40 MG PO TABS: 40.0000 mg | ORAL_TABLET | Freq: Every day | ORAL | Status: DC

## 2014-06-19 MED ORDER — MOMETASONE FURO-FORMOTEROL FUM 100-5 MCG/ACT IN AERO: 1.0000 | INHALATION_SPRAY | Freq: Two times a day (BID) | RESPIRATORY_TRACT | Status: DC

## 2014-06-19 MED ORDER — SODIUM CHLORIDE 0.9 % IJ SOLN
INTRAMUSCULAR | Status: AC
  Filled 2014-06-19: qty 50

## 2014-06-19 MED ORDER — SODIUM CHLORIDE 0.9 % IV SOLN
0.0000 ug/h | INTRAVENOUS | Status: DC
  Administered 2014-06-20: 50 ug/h via INTRAVENOUS
  Filled 2014-06-19: qty 50

## 2014-06-19 MED ORDER — TRANEXAMIC ACID 100 MG/ML IV SOLN
2000.0000 mg | Freq: Once | INTRAVENOUS | Status: DC
  Filled 2014-06-19: qty 20

## 2014-06-19 MED ORDER — BUPIVACAINE HCL (PF) 0.25 % IJ SOLN
INTRAMUSCULAR | Status: DC | PRN
  Administered 2014-06-19: 20 mL

## 2014-06-19 MED ORDER — SODIUM CHLORIDE 0.9 % IR SOLN
Status: DC | PRN
  Administered 2014-06-19: 14:00:00

## 2014-06-19 MED ORDER — MIDAZOLAM HCL 2 MG/2ML IJ SOLN
INTRAMUSCULAR | Status: AC
  Administered 2014-06-19: 2 mg
  Filled 2014-06-19: qty 2

## 2014-06-19 MED ORDER — GLYCOPYRROLATE 0.2 MG/ML IJ SOLN
INTRAMUSCULAR | Status: DC | PRN
  Administered 2014-06-19: 0.2 mg via INTRAVENOUS
  Administered 2014-06-19: 0.6 mg via INTRAVENOUS

## 2014-06-19 MED ORDER — PAROXETINE HCL 20 MG PO TABS: 20.0000 mg | ORAL_TABLET | Freq: Every day | ORAL | Status: DC

## 2014-06-19 MED ORDER — ONDANSETRON HCL 4 MG/2ML IJ SOLN
INTRAMUSCULAR | Status: DC | PRN
  Administered 2014-06-19: 4 mg via INTRAVENOUS

## 2014-06-19 MED ORDER — FENTANYL CITRATE 0.05 MG/ML IJ SOLN: 100.0000 ug | INTRAMUSCULAR | Status: DC | PRN

## 2014-06-19 MED ORDER — SODIUM CHLORIDE 0.9 % IV SOLN
INTRAVENOUS | Status: DC
  Administered 2014-06-19: 2.2 [IU]/h via INTRAVENOUS
  Filled 2014-06-19: qty 2.5

## 2014-06-19 MED ORDER — LABETALOL HCL 5 MG/ML IV SOLN
INTRAVENOUS | Status: DC | PRN
  Administered 2014-06-19 (×2): 5 mg via INTRAVENOUS

## 2014-06-19 MED ORDER — FUROSEMIDE 10 MG/ML IJ SOLN
INTRAMUSCULAR | Status: AC
  Administered 2014-06-19: 40 mg via INTRAMUSCULAR
  Administered 2014-06-19: 20 mg via INTRAMUSCULAR
  Filled 2014-06-19: qty 2

## 2014-06-19 MED ORDER — FUROSEMIDE 10 MG/ML IJ SOLN
8.0000 mg/h | INTRAVENOUS | Status: DC
  Administered 2014-06-19: 8 mg/h via INTRAVENOUS
  Filled 2014-06-19: qty 25

## 2014-06-19 MED ORDER — FENTANYL BOLUS VIA INFUSION
50.0000 ug | INTRAVENOUS | Status: DC | PRN
Start: 2014-06-19 — End: 2014-06-20
  Administered 2014-06-20: 50 ug via INTRAVENOUS
  Filled 2014-06-19: qty 100

## 2014-06-19 MED ORDER — SODIUM CHLORIDE 0.9 % IR SOLN
Status: DC | PRN
  Administered 2014-06-19: 1000 mL

## 2014-06-19 MED ORDER — METHOCARBAMOL 500 MG PO TABS: 500.0000 mg | ORAL_TABLET | Freq: Four times a day (QID) | ORAL | Status: DC | PRN

## 2014-06-19 MED ORDER — ESMOLOL HCL 10 MG/ML IV SOLN
INTRAVENOUS | Status: AC
  Filled 2014-06-19: qty 10

## 2014-06-19 MED ORDER — DEXAMETHASONE SODIUM PHOSPHATE 10 MG/ML IJ SOLN
INTRAMUSCULAR | Status: AC
  Filled 2014-06-19: qty 1

## 2014-06-19 MED ORDER — LIDOCAINE HCL (CARDIAC) 20 MG/ML IV SOLN
INTRAVENOUS | Status: AC
  Filled 2014-06-19: qty 5

## 2014-06-19 MED ORDER — ALPRAZOLAM 0.5 MG PO TABS: 0.5000 mg | ORAL_TABLET | Freq: Three times a day (TID) | ORAL | Status: DC

## 2014-06-19 MED FILL — Medication: Qty: 1 | Status: AC

## 2014-06-19 SURGICAL SUPPLY — 72 items
ADH SKN CLS APL DERMABOND .7 (GAUZE/BANDAGES/DRESSINGS) ×1
BAG SPEC THK2 15X12 ZIP CLS (MISCELLANEOUS)
BAG ZIPLOCK 12X15 (MISCELLANEOUS) IMPLANT
BANDAGE ELASTIC 4 VELCRO ST LF (GAUZE/BANDAGES/DRESSINGS) ×3 IMPLANT
BANDAGE ELASTIC 6 VELCRO ST LF (GAUZE/BANDAGES/DRESSINGS) ×3 IMPLANT
BANDAGE ESMARK 6X9 LF (GAUZE/BANDAGES/DRESSINGS) ×1 IMPLANT
BLADE SAG 18X100X1.27 (BLADE) ×3 IMPLANT
BLADE SAW SGTL 11.0X1.19X90.0M (BLADE) ×3 IMPLANT
BNDG CMPR 9X6 STRL LF SNTH (GAUZE/BANDAGES/DRESSINGS) ×1
BNDG ESMARK 6X9 LF (GAUZE/BANDAGES/DRESSINGS) ×3
BONE CEMENT GENTAMICIN (Cement) ×6 IMPLANT
CAPT RP KNEE ×2 IMPLANT
CEMENT BONE GENTAMICIN 40 (Cement) ×2 IMPLANT
CUFF TOURN SGL QUICK 34 (TOURNIQUET CUFF) ×3
CUFF TRNQT CYL 34X4X40X1 (TOURNIQUET CUFF) ×1 IMPLANT
DERMABOND ADVANCED (GAUZE/BANDAGES/DRESSINGS) ×2
DERMABOND ADVANCED .7 DNX12 (GAUZE/BANDAGES/DRESSINGS) IMPLANT
DRAPE EXTREMITY T 121X128X90 (DRAPE) ×3 IMPLANT
DRAPE INCISE IOBAN 66X45 STRL (DRAPES) IMPLANT
DRAPE POUCH INSTRU U-SHP 10X18 (DRAPES) ×3 IMPLANT
DRAPE U-SHAPE 47X51 STRL (DRAPES) ×3 IMPLANT
DRSG AQUACEL AG ADV 3.5X10 (GAUZE/BANDAGES/DRESSINGS) ×3 IMPLANT
DRSG OPSITE POSTOP 3X4 (GAUZE/BANDAGES/DRESSINGS) ×2 IMPLANT
DRSG PAD ABDOMINAL 8X10 ST (GAUZE/BANDAGES/DRESSINGS) IMPLANT
DRSG TEGADERM 4X4.75 (GAUZE/BANDAGES/DRESSINGS) ×3 IMPLANT
DURAPREP 26ML APPLICATOR (WOUND CARE) ×3 IMPLANT
ELECT REM PT RETURN 9FT ADLT (ELECTROSURGICAL) ×3
ELECTRODE REM PT RTRN 9FT ADLT (ELECTROSURGICAL) ×1 IMPLANT
EVACUATOR 1/8 PVC DRAIN (DRAIN) ×3 IMPLANT
FACESHIELD WRAPAROUND (MASK) ×15 IMPLANT
FACESHIELD WRAPAROUND OR TEAM (MASK) ×5 IMPLANT
GAUZE SPONGE 2X2 8PLY STRL LF (GAUZE/BANDAGES/DRESSINGS) ×1 IMPLANT
GLOVE BIOGEL PI IND STRL 6.5 (GLOVE) ×1 IMPLANT
GLOVE BIOGEL PI IND STRL 8 (GLOVE) ×1 IMPLANT
GLOVE BIOGEL PI INDICATOR 6.5 (GLOVE) ×2
GLOVE BIOGEL PI INDICATOR 8 (GLOVE) ×2
GLOVE ECLIPSE 8.0 STRL XLNG CF (GLOVE) ×6 IMPLANT
GLOVE SURG SS PI 6.5 STRL IVOR (GLOVE) ×3 IMPLANT
GOWN STRL REUS W/TWL LRG LVL3 (GOWN DISPOSABLE) ×3 IMPLANT
GOWN STRL REUS W/TWL XL LVL3 (GOWN DISPOSABLE) ×3 IMPLANT
HANDPIECE INTERPULSE COAX TIP (DISPOSABLE) ×3
IMMOBILIZER KNEE 20 (SOFTGOODS) ×5 IMPLANT
IMMOBILIZER KNEE 20 THIGH 36 (SOFTGOODS) ×1 IMPLANT
KIT BASIN OR (CUSTOM PROCEDURE TRAY) ×3 IMPLANT
LIQUID BAND (GAUZE/BANDAGES/DRESSINGS) ×3 IMPLANT
MANIFOLD NEPTUNE II (INSTRUMENTS) ×3 IMPLANT
NEEDLE HYPO 22GX1.5 SAFETY (NEEDLE) ×8 IMPLANT
NS IRRIG 1000ML POUR BTL (IV SOLUTION) IMPLANT
PACK TOTAL JOINT (CUSTOM PROCEDURE TRAY) ×3 IMPLANT
PADDING CAST COTTON 6X4 STRL (CAST SUPPLIES) IMPLANT
POSITIONER SURGICAL ARM (MISCELLANEOUS) ×3 IMPLANT
SET HNDPC FAN SPRY TIP SCT (DISPOSABLE) ×1 IMPLANT
SET PAD KNEE POSITIONER (MISCELLANEOUS) ×3 IMPLANT
SPONGE GAUZE 2X2 STER 10/PKG (GAUZE/BANDAGES/DRESSINGS) ×2
SPONGE LAP 18X18 X RAY DECT (DISPOSABLE) IMPLANT
SPONGE SURGIFOAM ABS GEL 100 (HEMOSTASIS) ×3 IMPLANT
STAPLER VISISTAT 35W (STAPLE) IMPLANT
SUCTION FRAZIER 12FR DISP (SUCTIONS) ×3 IMPLANT
SUT BONE WAX W31G (SUTURE) ×3 IMPLANT
SUT MNCRL AB 4-0 PS2 18 (SUTURE) ×3 IMPLANT
SUT VIC AB 1 CT1 27 (SUTURE) ×6
SUT VIC AB 1 CT1 27XBRD ANTBC (SUTURE) ×2 IMPLANT
SUT VIC AB 2-0 CT1 27 (SUTURE) ×9
SUT VIC AB 2-0 CT1 TAPERPNT 27 (SUTURE) ×3 IMPLANT
SUT VLOC 180 0 24IN GS25 (SUTURE) ×3 IMPLANT
SYR 20CC LL (SYRINGE) ×9 IMPLANT
TOWEL OR 17X26 10 PK STRL BLUE (TOWEL DISPOSABLE) ×3 IMPLANT
TOWEL OR NON WOVEN STRL DISP B (DISPOSABLE) IMPLANT
TOWER CARTRIDGE SMART MIX (DISPOSABLE) ×3 IMPLANT
TRAY FOLEY CATH 14FRSI W/METER (CATHETERS) ×3 IMPLANT
WATER STERILE IRR 1500ML POUR (IV SOLUTION) ×3 IMPLANT
WRAP KNEE MAXI GEL POST OP (GAUZE/BANDAGES/DRESSINGS) ×3 IMPLANT

## 2014-06-19 NOTE — Progress Notes (Signed)
eLink Physician-Brief Progress Note Patient Name: [name] [name] DOB: Sep 03, 1950 MRN: [id number]   Date of Service  06/19/2014  HPI/Events of Note  63 yo female with PMHx of COPD, Tobacco abuse, DM, presented for R knee replacement. During the procedure patient developed pulmonary edema and hypoxia, difficult to ventilate during the procedure, got 20mg  IV lasix with some improvement but still with some level of hypoxia.  After procedure, patient transferred to ICU, on ventilator.  History per chart review and from anesthiesology and orthopedic physicians  eICU Interventions  63 yo with VDFR 1. VDFR - DDx included CHF, COPD, fluid overload - cont with vent support - CXR with inc vascular marking, consistent with edema, plan to monitor closely - restrict I\o - check CMP, Mg, Phos, cardiac enzymes, BNP and ECHO  2. Hx of COPD - prn duonebs while on vent, then resume home inhalers once extubated.   3. DM - ssi  GI prophylaxis - PPI DVT - post op Day 1, ted hose     Intervention Category Evaluation Type: New Patient Evaluation  [name] [name] 06/19/2014, 4:36 PM

## 2014-06-19 NOTE — Progress Notes (Signed)
CRITICAL VALUE ALERT  Critical value received:  Troponin 0.67  Date of notification:  06/19/2014  Time of notification:  4707    Critical value read back:Yes.    Nurse who received alert:  Leola Brazil  MD notified (1st page):  Dr. Stevenson Clinch  Time of first page:  29  MD notified (2nd page):  Time of second page:  Responding MD:  Dr. Stevenson Clinch  Time MD responded:  1800

## 2014-06-19 NOTE — Brief Op Note (Signed)
06/19/2014  2:55 PM  PATIENT:  [name] [name]  63 y.o. female  PRE-OPERATIVE DIAGNOSIS: Primary  OSTEOARTHRITIS RIGHT KNEE   POST-OPERATIVE DIAGNOSIS:Primary  OSTEOARTHRITIS RIGHT KNEE  PROCEDURE:  Procedure(s): RIGHT TOTAL KNEE ARTHROPLASTY (Right)  SURGEON:  Surgeon(s) and Role:    * Tobi Bastos, MD - Primary  PHYSICIAN ASSISTANT: Ardeen Jourdain PA  ASSISTANTS:Amber Slater-Marietta PA  ANESTHESIA:   general  EBL:     BLOOD ADMINISTERED:none  DRAINS: (One) Hemovact drain(s) in the Right Knee with  Suction Open   LOCAL MEDICATIONS USED:  MARCAINE 20cc of Plain 0.25% Marcaine and 20cc of Exparel mixed with 20cc of Normal Saline    SPECIMEN:  No Specimen  DISPOSITION OF SPECIMEN:  N/A  COUNTS:  YES  TOURNIQUET:  * Missing tourniquet times found for documented tourniquets in log:  [id number] *  DICTATION: .Other Dictation: Dictation Number [id number]  PLAN OF CARE: Admit to inpatient   PATIENT DISPOSITION:  Stable in OR   Delay start of Pharmacological VTE agent (>24hrs) due to surgical blood loss or risk of bleeding: yes

## 2014-06-19 NOTE — Progress Notes (Signed)
Patient developed pulmonary edema at the end of her surgery and we gave lasix 20 mg and decided to admit to ICU.  While waiting to move her to ICU became very difficult to ventilate.  SaO2 in 60's % during this time.  Gave IV increments of epinephrine, inhaled anesthetic, and albuterol, but eventually impossible to ventilate.  Reintubated and looked with bronchoscope, but everything was normal.  Changed filter in circuit and immediately could ventilate with no problem.  SaO2 very briefly as low as 30% but mostly 55-65% during the period it took to figure out what the problem was.  Patient was stable when we took her to ICU with SaO2 above 90% and normal BP.  Have ordered cardiac enzymes and CXR.  Called ICU physician with history.   Camdon Saetern MD

## 2014-06-19 NOTE — Anesthesia Preprocedure Evaluation (Addendum)
Anesthesia Evaluation  Patient identified by MRN, date of birth, ID band Patient awake    Reviewed: Allergy & Precautions, H&P , NPO status , Patient's Chart, lab work & pertinent test results, reviewed documented beta blocker date and time   Airway Mallampati: II  TM Distance: >3 FB Neck ROM: Full    Dental  (+) Dental Advisory Given, Edentulous Upper, Edentulous Lower   Pulmonary shortness of breath and with exertion, asthma , COPD COPD inhaler, Current Smoker, former smoker,  breath sounds clear to auscultation  Pulmonary exam normal       Cardiovascular hypertension, Pt. 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), Residual Symptoms negative psych ROS   GI/Hepatic negative GI ROS, Neg liver ROS, hiatal hernia, GERD-  ,  Endo/Other  diabetes, Type 2, Oral Hypoglycemic Agents  Renal/GU negative Renal ROS  negative genitourinary   Musculoskeletal   Abdominal   Peds  Hematology negative hematology ROS (+)   Anesthesia Other Findings   Reproductive/Obstetrics negative OB ROS                            Anesthesia Physical Anesthesia Plan  ASA: III  Anesthesia Plan: General   Post-op Pain Management:    Induction: Intravenous  Airway Management Planned: Oral ETT  Additional Equipment:   Intra-op Plan:   Post-operative Plan: Extubation in OR  Informed Consent: I have reviewed the patients History and Physical, chart, labs and discussed the procedure including the risks, benefits and alternatives for the proposed anesthesia with the patient or authorized representative who has indicated his/her understanding and acceptance.   Dental Advisory Given  Plan Discussed with: CRNA and Surgeon  Anesthesia Plan Comments:        Anesthesia Quick Evaluation

## 2014-06-19 NOTE — Telephone Encounter (Signed)
Attempted to call the pt again and the same message as before keeps playing.  Wcb.

## 2014-06-19 NOTE — Op Note (Signed)
NAMELILLYONNA, ARMSTEAD NO.:  [id number]  MEDICAL RECORD NO.:  [id number]  LOCATION:  53                         FACILITY:  [facility]  PHYSICIAN:  Kipp Brood. Brentyn Seehafer, M.D.DATE OF BIRTH:  Mar 26, 1951  DATE OF PROCEDURE:  06/19/2014 DATE OF DISCHARGE:                              OPERATIVE REPORT   SURGEON:  Kipp Brood. Gladstone Lighter, M.D.  OPERATIVE ASSISTANT:  Ardeen Jourdain, PA.  PREOPERATIVE DIAGNOSIS:  Severe bone-on-bone osteoarthritis of the right knee.  POSTOPERATIVE DIAGNOSIS:  Severe bone-on-bone osteoarthritis of the right knee.  OPERATION:  Right total knee arthroplasty utilizing DePuy system; all 3 components were cemented.  Gentamicin was used in the cement.  The sizes used was a size 2 right femoral component posterior cruciate sacrificing type tibial tray, which was size 2, the insert was size 2 with a 10 mm thickness polyethylene rotating platform insert, patella was a size 32 with 3 pegs.  DESCRIPTION OF PROCEDURE:  Under general anesthesia, routine orthopedic prep and draping of the right lower extremity was carried out.  The appropriate time-out was first carried out.  I also marked the appropriate right leg in the holding area.  At this time, the leg was exsanguinated with Esmarch, tourniquet was elevated at 325 mmHg.  The knee then was placed in a DeMayo knee holder and flexed, an anterior approach to the knee was carried out.  Two flaps were created.  I then carried out a median parapatellar incision, reflected patella laterally and with the knee flexed, I did excise the anterior and posterior cruciate ligaments and then medial and lateral meniscectomies were carried out.  At this time, I did a synovectomy as well.  I then made my initial drill hole in the intercondylar notch.  At this time, the canal finer was inserted.  I thoroughly irrigated out the canal after that was removed.  I then removed 30 mm thickness off the distal femur because  of the slight contracture.  At this time, we then prepared the tibia in the usual fashion.  A drill hole was made in the tibial plateau.  We then removed 6 mm thickness off the affected medial side by utilizing intramedullary guide.  At this time, we then removed all the remaining spurs by inserting our laminar spreaders.  Following that, we then inserted our 10 mm thickness spacer blocks, had excellent stability.  We then removed those and continued to prepare the tibia by cutting our keel cut in the tibia.  We then cut our notch cut out of the distal femur.  We inserted our trial components and selected a 10 mm thickness insert.  With the knee in extension, I then did a resurfacing procedure on the patella for a size 32 patella, 3 drill holes were made in the patella articular surface.  Following that, all trial components were removed.  We water picked out the knee thoroughly, cemented all 3 components in simultaneously.  Once the cement was hardened, we then removed the loose pieces of cement, we then water picked the knee out again to make sure no root creases were present, and we also reinspected posteriorly.  I then inserted some thrombin-soaked Gelfoam posteriorly. The permanent component  was size 2 polyethylene rotating platform, 10 mm thickness insert was inserted, knee was reduced, we had excellent motion.  The Hemovac drain was inserted.  Prior to closing the wound initially while the cement was hardening, we injected 20 mL of 0.25% Marcaine plain into the soft tissue after closing the capsule.  We then injected 20 mL of Exparel mixed with 20 mL of normal saline.  We also injected the tranexamic acid in the soft tissue area above the capsule.  The patient had 1 g of IV vancomycin preop.  The remaining part of the wound was closed in the usual fashion. All soft tissue structures were closed.  Skin was closed in subcu fashion and sterile dressings were applied.           ______________________________ Kipp Brood Gladstone Lighter, M.D.     RAG/MEDQ  D:  06/19/2014  T:  06/19/2014  Job:  [id number]

## 2014-06-19 NOTE — Transfer of Care (Signed)
Immediate Anesthesia Transfer of Care Note  Patient: [name] [name]  Procedure(s) Performed: Procedure(s): RIGHT TOTAL KNEE ARTHROPLASTY (Right)  Patient Location: PACU and ICU  Anesthesia Type:General  Level of Consciousness: sedated and Patient remains intubated per anesthesia plan  Airway & Oxygen Therapy: Patient remains intubated per anesthesia plan  Post-op Assessment: Post -op Vital signs reviewed and stable  Post vital signs: Reviewed  Complications: respiratory complications. Patient remains intubated and sent to ICU.

## 2014-06-19 NOTE — Care Management Note (Signed)
    Page 1 of 2   06/19/2014     4:33:23 PM CARE MANAGEMENT NOTE 06/19/2014  Patient:  [name] [name], [name] [name]   Account Number:  [id number]  Date Initiated:  06/19/2014  Documentation initiated by:  Persephonie Hegwood  Subjective/Objective Assessment:   post op complication requiring reintubation in pacu     Action/Plan:   tbd   Anticipated DC Date:  06/22/2014   Anticipated DC Plan:  HOME/SELF CARE  In-house referral  NA      DC Planning Services  NA      [facility] Choice  NA   Choice offered to / List presented to:  NA   DME arranged  NA      DME agency  NA     [place] arranged  NA      [place] agency  NA   Status of service:  In process, will continue to follow Medicare Important Message given?   (If response is "NO", the following Medicare IM given date fields will be blank) Date Medicare IM given:   Medicare IM given by:   Date Additional Medicare IM given:   Additional Medicare IM given by:    Discharge Disposition:    Per UR Regulation:  Reviewed for med. necessity/level of care/duration of stay  If discussed at [place] of Stay Meetings, dates discussed:    Comments:  [id number]/Donneisha Beane Rosana Hoes, RN, BSN, CCM Chart reviewed. Discharge needs and patient's stay to be reviewed and followed by case manager. Chart note for progression of stay: RIGHT TOTAL KNEE ARTHROPLASTY (Right) Patient developed pulmonary edema at the end of her surgery and we gave lasix 20 mg and decided to admit to ICU.  While waiting to move her to ICU became very difficult to ventilate.  SaO2 in 60's % during this time.  Gave IV increments of epinephrine, inhaled anesthetic, and albuterol, but eventually impossible to ventilate.  Reintubated and looked with bronchoscope, but everything was normal.  Changed filter in circuit and immediately could ventilate with no problem.  SaO2 very briefly as low as 30% but mostly 55-65% during the period it took to figure out what the problem was.  Patient was stable when we  took her to ICU with SaO2 above 90% and normal BP.  Have ordered cardiac enzymes and CXR.  Called ICU physician with history.   [place] MD

## 2014-06-19 NOTE — Anesthesia Postprocedure Evaluation (Signed)
  Anesthesia Post-op Note  Patient: [name] [name]  Procedure(s) Performed: Procedure(s) (LRB): RIGHT TOTAL KNEE ARTHROPLASTY (Right)  Patient Location: ICU  Anesthesia Type: General  Level of Consciousness: sedated   Airway and Oxygen Therapy: On ventilator  Post-op Pain: mild  Post-op Assessment: Post-op Vital signs reviewed, Patient's Cardiovascular Status Stable, Respiratory Function Stable, Intubated and No signs of Nausea or vomiting  Last Vitals:  Filed Vitals:   06/19/14 1117  BP: 138/72  Pulse: 68  Temp: 36.4 C  Resp: 16    Post-op Vital Signs: stable   Complications: Pulmonary edema from unknown causes and a period of hypoxia secondary to fluid saturation of filter in anesthesia circuit before detected and changed.

## 2014-06-19 NOTE — Interval H&P Note (Signed)
History and Physical Interval Note:  06/19/2014 12:46 PM  [name] [name]  has presented today for surgery, with the diagnosis of OA RIGHT KNEE   The various methods of treatment have been discussed with the patient and family. After consideration of risks, benefits and other options for treatment, the patient has consented to  Procedure(s): RIGHT TOTAL KNEE ARTHROPLASTY (Right) as a surgical intervention .  The patient's history has been reviewed, patient examined, no change in status, stable for surgery.  I have reviewed the patient's chart and labs.  Questions were answered to the patient's satisfaction.     Reg Bircher A

## 2014-06-19 NOTE — Consult Note (Signed)
PULMONARY / CRITICAL CARE MEDICINE   Name: SAMAMTHA TIEGS MRN: [id number] DOB: June 24, 1951    ADMISSION DATE:  06/19/2014 CONSULTATION DATE:  06/19/2014  REFERRING MD :  Gladstone Lighter - Ortho  CHIEF COMPLAINT:  Respiratory failure  INITIAL PRESENTATION: 63 year old female presented for R knee arthroscopy 11/19. After procedure completed while still on vent developed flash pulmonary edema, which was reportedly pouring from ETT. She remained intubated, but severely hypoxemic post op to ICU. PCCM to assume care.  STUDIES:  PFT- 11/05/12- Normal except mild reduction of DLCO. No response to bronchodilator. FVC 2.17/ 83%, FEV1 1.76/ 91%, FEV1/FVC 0.81, TLC 93%, DLCO 68%.  SIGNIFICANT EVENTS: 11/19 intubated to ICU  HISTORY OF PRESENT ILLNESS:  63 year old female with PMH as below, which is significant for COPD followed by CY in office, Barretts esophagus, GERD, Aortic valve disorder, and DM. She presented for R knee arthoplasty 11/19. The procedure went as planned according to orthopedist. After incision was closed she was extubated. She then developed profound hypoxemia, was re-intubated, and OR staff noted copious white frothy secretions coming from ETT. She was given 60mg  Lasix in recovery room and ventilatory settings were maximized by CRNA. She was transported to [facility] ICU where she remained hypoxic with heavy ventilatory support. PCCM to see and assume care.   PAST MEDICAL HISTORY :   has a past medical history of Insomnia, unspecified; Esophageal reflux; Hyperlipemia; Asthma; Osteoporosis; Arthritis; COPD (chronic obstructive pulmonary disease); Depression; Personal history of colonic polyps (09/2010); Barrett esophagus; Hiatal hernia; Esophageal stricture; GERD (gastroesophageal reflux disease); GAD (generalized anxiety disorder); Aortic valve disorders; Heart murmur; Anxiety; Shortness of breath; Pneumonia; Type II or unspecified type diabetes mellitus without mention of complication, not stated as  uncontrolled; Anemia; Hypertension (12/07/2012); Myocardial infarction (01-08-13); History of kidney stones (01-08-13); Transfusion history (01-08-13); Dysrhythmia; Stroke; and Urinary frequency.  has past surgical history that includes Cholecystectomy (2005); Partial hysterectomy; Shoulder surgery (2011); Tubal ligation; Tonsillectomy; Foot surgery (2005); Wrist flexion tendon tenotomies and proximal corpectomy w/ wrist arthrodesis&iliac crest bone graft; Cardiac catheterization; Knee arthroscopy (02/28/2012); Esophageal dilation (01-08-13); Cataract extraction, bilateral; and Total knee arthroplasty (Left, 01/17/2013). Prior to Admission medications   Medication Sig Start Date End Date Taking? Authorizing Provider  albuterol (PROVENTIL HFA;VENTOLIN HFA) 108 (90 BASE) MCG/ACT inhaler Inhale 2 puffs into the lungs every 4 (four) hours as needed for wheezing or shortness of breath. Take two puffs four times a day as needed 11/06/13 12/28/14 Yes Clinton D Young, MD  albuterol (PROVENTIL) (2.5 MG/3ML) 0.083% nebulizer solution Take 3 mLs (2.5 mg total) by nebulization every 6 (six) hours as needed for wheezing. DX  491.9 08/23/12  Yes Deneise Lever, MD  ALPRAZolam Duanne Moron) 0.5 MG tablet Take 0.5 mg by mouth 3 (three) times daily.   Yes Historical Provider, MD  aspirin EC 81 MG tablet Take 81 mg by mouth every morning.    Yes Historical Provider, MD  atorvastatin (LIPITOR) 40 MG tablet Take 1 tablet (40 mg total) by mouth at bedtime. 07/09/13  Yes Mary-Margaret Hassell Done, FNP  benzonatate (TESSALON) 200 MG capsule Take 1 capsule (200 mg total) by mouth 3 (three) times daily as needed for cough. 01/03/14 01/03/15 Yes Clinton D Young, MD  calcium carbonate (OS-CAL) 600 MG TABS Take 600 mg by mouth at bedtime.    Yes Historical Provider, MD  Cholecalciferol (VITAMIN D) 1000 UNITS capsule Take 1,000 Units by mouth at bedtime.    Yes Historical Provider, MD  dexlansoprazole (DEXILANT) 60 MG  capsule Take 1 capsule (60 mg total) by  mouth every morning. 12/07/12  Yes Mary-Margaret Hassell Done, FNP  docusate sodium (COLACE) 100 MG capsule Take 100 mg by mouth 2 (two) times daily.   Yes Historical Provider, MD  ferrous sulfate 325 (65 FE) MG tablet Take 325 mg by mouth 2 (two) times daily.    Yes Historical Provider, MD  fluticasone (FLONASE) 50 MCG/ACT nasal spray Place 2 sprays into the nose daily. Patient taking differently: Place 1 spray into the nose 2 (two) times daily.  05/07/13  Yes Deneise Lever, MD  furosemide (LASIX) 40 MG tablet Take 40 mg by mouth every morning. 02/06/12  Yes Thayer Headings, MD  Menthol (HONEY LEMON COUGH DROPS MT) Use as directed in the mouth or throat. Taking approx. 8-9 daily   Yes Historical Provider, MD  metFORMIN (GLUCOPHAGE) 500 MG tablet Take 1 tablet (500 mg total) by mouth daily. Takes at bedtime. Patient taking differently: Take 500 mg by mouth 2 (two) times daily.  06/10/13  Yes Mae Loree Fee, FNP  mometasone-formoterol (DULERA) 100-5 MCG/ACT AERO 2 puffs then rinse mouth twice daily maintenance controller Patient taking differently: Inhale 1 puff into the lungs 2 (two) times daily.  05/07/13 06/17/14 Yes Clinton D Young, MD  montelukast (SINGULAIR) 10 MG tablet Take 1 tablet (10 mg total) by mouth at bedtime. 05/15/14  Yes Deneise Lever, MD  PARoxetine (PAXIL) 20 MG tablet Take 20 mg by mouth at bedtime.    Yes Historical Provider, MD  varenicline (CHANTIX CONTINUING MONTH PAK) 1 MG tablet Take 1 mg by mouth 2 (two) times daily.   Yes Historical Provider, MD  vitamin B-12 (CYANOCOBALAMIN) 1000 MCG tablet Take 1,000 mcg by mouth at bedtime.    Yes Historical Provider, MD  CHANTIX STARTING MONTH PAK 0.5 MG X 11 & 1 MG X 42 tablet TAKE AS DIRECTED 05/07/14   Deneise Lever, MD  EPINEPHrine 0.3 mg/0.3 mL IJ SOAJ injection Inject 0.3 mLs (0.3 mg total) into the muscle once. Patient taking differently: Inject 0.3 mg into the muscle once as needed. EPI PEN - PT ALLERGIC TO BEES 05/28/14   Deneise Lever, MD   Allergies  Allergen Reactions  . Other Anaphylaxis    BEE STINGS- CLOSE OFF THROAT  . Penicillins Anaphylaxis    "CLOSES OFF MY BREATHING"  . Pneumococcal Vaccines Other (See Comments)    PP-23 vaccine; had BIG local red reaction with heat.   Jaclyn Prime [Ibandronate Sodium]     Jaw popping  . Ceclor [Cefaclor] Other (See Comments)    Reaction=burning all over  . Codeine Nausea And Vomiting  . Cortisone Hives    All over body    FAMILY HISTORY:  indicated that her mother is alive. She indicated that her father is deceased. She indicated that her brother is alive.  SOCIAL HISTORY:  reports that she quit smoking about a year ago. Her smoking use included Cigarettes. She has a 10 pack-year smoking history. She quit smokeless tobacco use about 2 years ago. Her smokeless tobacco use included Snuff and Chew. She reports that she drinks about 0.6 oz of alcohol per week. She reports that she does not use illicit drugs.  REVIEW OF SYSTEMS:  Unable, encephalopathic  SUBJECTIVE:   VITAL SIGNS: Temp:  [97.6 F (36.4 C)] 97.6 F (36.4 C) (11/19 1117) Pulse Rate:  [68] 68 (11/19 1117) Resp:  [16] 16 (11/19 1117) BP: (138)/(72) 138/72 mmHg (11/19 1117) SpO2:  [  94 %] 94 % (11/19 1117) Weight:  [80.74 kg (178 lb)] 80.74 kg (178 lb) (11/19 1136) HEMODYNAMICS:   VENTILATOR SETTINGS:   INTAKE / OUTPUT:  Intake/Output Summary (Last 24 hours) at 06/19/14 1650 Last data filed at 06/19/14 1615  Gross per 24 hour  Intake   1000 ml  Output     50 ml  Net    950 ml    PHYSICAL EXAMINATION: General:  Elderly female on vent Neuro:  Sedated on vent HEENT:  [place]/AT, PERRL, no JVD noted Cardiovascular:  S1S2 intact, no MRG noted Lungs:  Diminished. Course sounds throughout.  Abdomen:  Soft, non-distended Musculoskeletal:  No acute deformity Skin:  Intact  LABS:  CBC  Recent Labs Lab 06/19/14 1255 06/19/14 1256  WBC 7.3  --   HGB 11.7* 11.6*  HCT 35.1* 34.0*  PLT 192   --    Coag's No results for input(s): APTT, INR in the last 168 hours. BMET  Recent Labs Lab 06/19/14 1256  NA 140  K 3.2*  GLUCOSE 128*   Electrolytes No results for input(s): CALCIUM, MG, PHOS in the last 168 hours. Sepsis Markers No results for input(s): LATICACIDVEN, PROCALCITON, O2SATVEN in the last 168 hours. ABG No results for input(s): PHART, PCO2ART, PO2ART in the last 168 hours. Liver Enzymes No results for input(s): AST, ALT, ALKPHOS, BILITOT, ALBUMIN in the last 168 hours. Cardiac Enzymes No results for input(s): TROPONINI, PROBNP in the last 168 hours. Glucose  Recent Labs Lab 06/19/14 1121  GLUCAP 135*    Imaging No results found.   ASSESSMENT / PLAN:  PULMONARY OETT 11/19 A: Acute respiratory failure in setting of pulmonary edema.  Pulmonary Edema, complete bilateral airspace opacification. Suspect due to extubation in PACU H/o COPD without acute exacerbation  P:   Full vent support - PAC, 13/12, 20, 100% Check CXR Follow ABG VAP prevention per protocol Lasix gtt Scheduled nebs Holding chantix, inhalers   CARDIOVASCULAR A:  Hypotension - likely sedation related CAD  P:  MAP goal > 61mm/Hg Hold IVF Trend troponin 2D Echo Check BNP EKG Holding statin  RENAL A:   Hypokalemia  P:   Follow Bmet Supplement K per tube  GASTROINTESTINAL A:   H/o GERD, Barrett's esophagus  P:   NPO SUP: IV Pepcid  HEMATOLOGIC A:   Mild anemia  P:  Follow CBC SCDs Start xarelto or heparin gtt 11/20 per ortho  INFECTIOUS A:  No acute issues  P:   Monitor clinically  ENDOCRINE A:   DM  P:   CBG and SSI  NEUROLOGIC A:   Acute encephalopathy in setting of hypoxemia  P:   RASS goal: -1 PRN fentanyl and versed PAD protocol Holding paroxetine, alprazolam   FAMILY  - Updates:   - Inter-disciplinary family meet or Palliative Care meeting due by:  11/26   TODAY'S SUMMARY: 63 year old female with flash pulmonary edema  s/p extubation in OR (R knee arthroplasty). Refractory hypoxemia with heavy vent support. Maintain on pressure control ventilation with high PEEP and lasix gtt. PRN sedation.    Georgann Housekeeper, ACNP [place] [facility] Pager [phone] or [phone]  Severe pulmonary edema and hypoxemia, will place on lasix drip and order troponins and echo and EKG, ?if patient had an intraop MI.  CC time 45 min  Patient seen and examined, agree with above note.  I dictated the care and orders written for this patient under my direction.  Rush Farmer, MD [phone]

## 2014-06-20 ENCOUNTER — Inpatient Hospital Stay (HOSPITAL_COMMUNITY): Payer: Medicaid Other

## 2014-06-20 ENCOUNTER — Encounter (HOSPITAL_COMMUNITY): Payer: Self-pay | Admitting: Orthopedic Surgery

## 2014-06-20 DIAGNOSIS — I420 Dilated cardiomyopathy: Secondary | ICD-10-CM

## 2014-06-20 DIAGNOSIS — I248 Other forms of acute ischemic heart disease: Secondary | ICD-10-CM

## 2014-06-20 DIAGNOSIS — R7989 Other specified abnormal findings of blood chemistry: Secondary | ICD-10-CM

## 2014-06-20 DIAGNOSIS — J9600 Acute respiratory failure, unspecified whether with hypoxia or hypercapnia: Secondary | ICD-10-CM

## 2014-06-20 DIAGNOSIS — I4891 Unspecified atrial fibrillation: Secondary | ICD-10-CM

## 2014-06-20 DIAGNOSIS — R778 Other specified abnormalities of plasma proteins: Secondary | ICD-10-CM

## 2014-06-20 LAB — BLOOD GAS, ARTERIAL
Acid-Base Excess: 2.3 mmol/L — ABNORMAL HIGH (ref 0.0–2.0)
Bicarbonate: 24.3 mEq/L — ABNORMAL HIGH (ref 20.0–24.0)
DRAWN BY: 308601
FIO2: 0.6 %
LHR: 20 {breaths}/min
O2 SAT: 99.4 %
PATIENT TEMPERATURE: 98.6
PEEP/CPAP: 12 cmH2O
PH ART: 7.507 — AB (ref 7.350–7.450)
PRESSURE CONTROL: 13 cmH2O
TCO2: 20.9 mmol/L (ref 0–100)
pCO2 arterial: 31 mmHg — ABNORMAL LOW (ref 35.0–45.0)
pO2, Arterial: 191 mmHg — ABNORMAL HIGH (ref 80.0–100.0)

## 2014-06-20 LAB — GLUCOSE, CAPILLARY
GLUCOSE-CAPILLARY: 121 mg/dL — AB (ref 70–99)
GLUCOSE-CAPILLARY: 168 mg/dL — AB (ref 70–99)
GLUCOSE-CAPILLARY: 193 mg/dL — AB (ref 70–99)
GLUCOSE-CAPILLARY: 238 mg/dL — AB (ref 70–99)
GLUCOSE-CAPILLARY: 238 mg/dL — AB (ref 70–99)
Glucose-Capillary: 163 mg/dL — ABNORMAL HIGH (ref 70–99)
Glucose-Capillary: 213 mg/dL — ABNORMAL HIGH (ref 70–99)
Glucose-Capillary: 233 mg/dL — ABNORMAL HIGH (ref 70–99)

## 2014-06-20 LAB — BASIC METABOLIC PANEL
ANION GAP: 22 — AB (ref 5–15)
BUN: 15 mg/dL (ref 6–23)
CO2: 22 mEq/L (ref 19–32)
CREATININE: 0.82 mg/dL (ref 0.50–1.10)
Calcium: 8.4 mg/dL (ref 8.4–10.5)
Chloride: 96 mEq/L (ref 96–112)
GFR calc non Af Amer: 76 mL/min — ABNORMAL LOW (ref >90)
GFR, EST AFRICAN AMERICAN: 88 mL/min — AB (ref >90)
Glucose, Bld: 188 mg/dL — ABNORMAL HIGH (ref 70–99)
Potassium: 3.2 mEq/L — ABNORMAL LOW (ref 3.7–5.3)
Sodium: 140 mEq/L (ref 137–147)

## 2014-06-20 LAB — TROPONIN I
Troponin I: 1.3 ng/mL (ref <0.30)
Troponin I: 1.88 ng/mL (ref <0.30)

## 2014-06-20 LAB — CBC
HCT: 40.8 % (ref 36.0–46.0)
Hemoglobin: 13.7 g/dL (ref 12.0–15.0)
MCH: 30.7 pg (ref 26.0–34.0)
MCHC: 33.6 g/dL (ref 30.0–36.0)
MCV: 91.5 fL (ref 78.0–100.0)
PLATELETS: 246 10*3/uL (ref 150–400)
RBC: 4.46 MIL/uL (ref 3.87–5.11)
RDW: 13.8 % (ref 11.5–15.5)
WBC: 14.7 10*3/uL — ABNORMAL HIGH (ref 4.0–10.5)

## 2014-06-20 LAB — PHOSPHORUS: Phosphorus: 3.1 mg/dL (ref 2.3–4.6)

## 2014-06-20 LAB — MAGNESIUM: MAGNESIUM: 1.5 mg/dL (ref 1.5–2.5)

## 2014-06-20 LAB — HEPARIN LEVEL (UNFRACTIONATED): Heparin Unfractionated: 0.49 IU/mL (ref 0.30–0.70)

## 2014-06-20 MED ORDER — VARENICLINE TARTRATE 1 MG PO TABS: 1.0000 mg | ORAL_TABLET | Freq: Two times a day (BID) | ORAL | Status: DC

## 2014-06-20 MED ORDER — PERFLUTREN LIPID MICROSPHERE
1.0000 mL | INTRAVENOUS | Status: AC | PRN
  Administered 2014-06-20: 2 mL via INTRAVENOUS
  Administered 2014-06-20: 1 mL via INTRAVENOUS
  Filled 2014-06-20: qty 10

## 2014-06-20 MED ORDER — HEPARIN BOLUS VIA INFUSION
2000.0000 [IU] | Freq: Once | INTRAVENOUS | Status: AC
  Administered 2014-06-20: 2000 [IU] via INTRAVENOUS
  Filled 2014-06-20: qty 2000

## 2014-06-20 MED ORDER — HEPARIN (PORCINE) IN NACL 100-0.45 UNIT/ML-% IJ SOLN
1000.0000 [IU]/h | INTRAMUSCULAR | Status: DC
  Administered 2014-06-20: 1000 [IU]/h via INTRAVENOUS
  Filled 2014-06-20 (×3): qty 250

## 2014-06-20 MED ORDER — FENTANYL 10 MCG/ML IV SOLN
INTRAVENOUS | Status: DC
  Administered 2014-06-20: 20:00:00 via INTRAVENOUS
  Administered 2014-06-20: 447.9 ug via INTRAVENOUS
  Administered 2014-06-20: 12:00:00 via INTRAVENOUS
  Administered 2014-06-21: 120 ug via INTRAVENOUS
  Administered 2014-06-21: 60 ug/h via INTRAVENOUS
  Administered 2014-06-21: 135 ug via INTRAVENOUS
  Filled 2014-06-20 (×2): qty 50

## 2014-06-20 MED ORDER — INSULIN ASPART 100 UNIT/ML ~~LOC~~ SOLN: 0.0000 [IU] | Freq: Three times a day (TID) | SUBCUTANEOUS | Status: DC

## 2014-06-20 MED ORDER — MONTELUKAST SODIUM 10 MG PO TABS
10.0000 mg | ORAL_TABLET | Freq: Every day | ORAL | Status: DC
  Administered 2014-06-20 – 2014-06-23 (×4): 10 mg via ORAL
  Filled 2014-06-20 (×4): qty 1

## 2014-06-20 MED ORDER — DIPHENHYDRAMINE HCL 12.5 MG/5ML PO ELIX: 12.5000 mg | ORAL_SOLUTION | Freq: Four times a day (QID) | ORAL | Status: DC | PRN

## 2014-06-20 MED ORDER — AMIODARONE LOAD VIA INFUSION
150.0000 mg | Freq: Once | INTRAVENOUS | Status: AC
  Administered 2014-06-20: 150 mg via INTRAVENOUS
  Filled 2014-06-20: qty 83.34

## 2014-06-20 MED ORDER — SODIUM CHLORIDE 0.9 % IJ SOLN: 9.0000 mL | INTRAMUSCULAR | Status: DC | PRN

## 2014-06-20 MED ORDER — FLUTICASONE PROPIONATE 50 MCG/ACT NA SUSP
2.0000 | Freq: Every day | NASAL | Status: DC
  Administered 2014-06-20 – 2014-06-24 (×5): 2 via NASAL
  Filled 2014-06-20: qty 16

## 2014-06-20 MED ORDER — INSULIN ASPART 100 UNIT/ML ~~LOC~~ SOLN
0.0000 [IU] | Freq: Three times a day (TID) | SUBCUTANEOUS | Status: DC
  Administered 2014-06-20 – 2014-06-21 (×2): 5 [IU] via SUBCUTANEOUS
  Administered 2014-06-21: 3 [IU] via SUBCUTANEOUS
  Administered 2014-06-21: 2 [IU] via SUBCUTANEOUS
  Administered 2014-06-22: 5 [IU] via SUBCUTANEOUS
  Administered 2014-06-22: 3 [IU] via SUBCUTANEOUS
  Administered 2014-06-22: 2 [IU] via SUBCUTANEOUS
  Administered 2014-06-23: 3 [IU] via SUBCUTANEOUS
  Administered 2014-06-23: 2 [IU] via SUBCUTANEOUS
  Administered 2014-06-23: 3 [IU] via SUBCUTANEOUS
  Administered 2014-06-24: 5 [IU] via SUBCUTANEOUS
  Administered 2014-06-24: 2 [IU] via SUBCUTANEOUS

## 2014-06-20 MED ORDER — ALPRAZOLAM 0.5 MG PO TABS
0.5000 mg | ORAL_TABLET | Freq: Three times a day (TID) | ORAL | Status: DC | PRN
  Administered 2014-06-21 – 2014-06-23 (×4): 0.5 mg via ORAL
  Filled 2014-06-20 (×5): qty 1

## 2014-06-20 MED ORDER — INSULIN GLARGINE 100 UNIT/ML ~~LOC~~ SOLN
10.0000 [IU] | Freq: Every day | SUBCUTANEOUS | Status: DC
  Administered 2014-06-20 – 2014-06-24 (×5): 10 [IU] via SUBCUTANEOUS
  Filled 2014-06-20 (×5): qty 0.1

## 2014-06-20 MED ORDER — ASPIRIN 325 MG PO TABS
325.0000 mg | ORAL_TABLET | Freq: Every day | ORAL | Status: DC
  Administered 2014-06-21 – 2014-06-22 (×2): 325 mg via ORAL
  Filled 2014-06-20 (×2): qty 1

## 2014-06-20 MED ORDER — PAROXETINE HCL 20 MG PO TABS
20.0000 mg | ORAL_TABLET | Freq: Every day | ORAL | Status: DC
  Administered 2014-06-20 – 2014-06-23 (×4): 20 mg via ORAL
  Filled 2014-06-20 (×5): qty 1

## 2014-06-20 MED ORDER — ALBUTEROL SULFATE (2.5 MG/3ML) 0.083% IN NEBU: 2.5000 mg | INHALATION_SOLUTION | RESPIRATORY_TRACT | Status: DC | PRN

## 2014-06-20 MED ORDER — PANTOPRAZOLE SODIUM 40 MG PO TBEC
40.0000 mg | DELAYED_RELEASE_TABLET | Freq: Every day | ORAL | Status: DC
  Administered 2014-06-20 – 2014-06-24 (×5): 40 mg via ORAL
  Filled 2014-06-20 (×5): qty 1

## 2014-06-20 MED ORDER — AMIODARONE HCL IN DEXTROSE 360-4.14 MG/200ML-% IV SOLN
60.0000 mg/h | INTRAVENOUS | Status: AC
  Administered 2014-06-20: 60 mg/h via INTRAVENOUS
  Filled 2014-06-20 (×2): qty 200

## 2014-06-20 MED ORDER — ATORVASTATIN CALCIUM 40 MG PO TABS
40.0000 mg | ORAL_TABLET | Freq: Every day | ORAL | Status: DC
  Administered 2014-06-20 – 2014-06-23 (×4): 40 mg via ORAL
  Filled 2014-06-20 (×4): qty 1

## 2014-06-20 MED ORDER — NALOXONE HCL 0.4 MG/ML IJ SOLN: 0.4000 mg | INTRAMUSCULAR | Status: DC | PRN

## 2014-06-20 MED ORDER — MOMETASONE FURO-FORMOTEROL FUM 100-5 MCG/ACT IN AERO
1.0000 | INHALATION_SPRAY | Freq: Two times a day (BID) | RESPIRATORY_TRACT | Status: DC
  Administered 2014-06-20 – 2014-06-24 (×9): 1 via RESPIRATORY_TRACT
  Filled 2014-06-20: qty 8.8

## 2014-06-20 MED ORDER — ONDANSETRON HCL 4 MG/2ML IJ SOLN: 4.0000 mg | Freq: Four times a day (QID) | INTRAMUSCULAR | Status: DC | PRN

## 2014-06-20 MED ORDER — AMIODARONE HCL IN DEXTROSE 360-4.14 MG/200ML-% IV SOLN
30.0000 mg/h | INTRAVENOUS | Status: DC
  Administered 2014-06-20: 30 mg/h via INTRAVENOUS
  Administered 2014-06-20 – 2014-06-21 (×2): 40 mg/h via INTRAVENOUS
  Filled 2014-06-20 (×2): qty 200

## 2014-06-20 MED ORDER — DIPHENHYDRAMINE HCL 50 MG/ML IJ SOLN: 12.5000 mg | Freq: Four times a day (QID) | INTRAMUSCULAR | Status: DC | PRN

## 2014-06-20 MED ORDER — ASPIRIN 325 MG PO TABS: 325.0000 mg | ORAL_TABLET | Freq: Every day | ORAL | Status: DC

## 2014-06-20 NOTE — Progress Notes (Signed)
Subjective: 1 Day Post-Op Procedure(s) (LRB): RIGHT TOTAL KNEE ARTHROPLASTY (Right) Patient reports pain as 2 on 0-10 scale.  Doing Well compared to yesterday.She is alert and responding. Hemovac DCd.Case discussed in depth with Critical care.  Objective: Vital signs in last 24 hours: Temp:  [97.6 F (36.4 C)-99.2 F (37.3 C)] 99.2 F (37.3 C) (11/20 0429) Pulse Rate:  [50-119] 76 (11/20 0600) Resp:  [16-23] 20 (11/20 0600) BP: (89-141)/(59-107) 111/74 mmHg (11/20 0600) SpO2:  [84 %-100 %] 98 % (11/20 0600) FiO2 (%):  [40 %-100 %] 40 % (11/20 0410) Weight:  [80.74 kg (178 lb)-85.2 kg (187 lb 13.3 oz)] 85.2 kg (187 lb 13.3 oz) (11/20 0500)  Intake/Output from previous day: 11/19 0701 - 11/20 0700 In: 1158.5 [I.V.:1158.5] Out: 2200 [Urine:2200] Intake/Output this shift:     Recent Labs  06/19/14 1255 06/19/14 1256 06/19/14 1629 06/20/14 0512  HGB 11.7* 11.6* 13.8 13.7    Recent Labs  06/19/14 1629 06/20/14 0512  WBC 18.2* 14.7*  RBC 4.51 4.46  HCT 42.0 40.8  PLT 302 246    Recent Labs  06/19/14 1629 06/20/14 0512  NA 137 140  K 3.6* 3.2*  CL 97 96  CO2 22 22  BUN 18 15  CREATININE 0.92 0.82  GLUCOSE 448* 188*  CALCIUM 8.2* 8.4   No results for input(s): LABPT, INR in the last 72 hours.  Dorsiflexion/Plantar flexion intact  Assessment/Plan: 1 Day Post-Op Procedure(s) (LRB): RIGHT TOTAL KNEE ARTHROPLASTY (Right) Up with therapy  Taegan Haider A 06/20/2014, 7:15 AM

## 2014-06-20 NOTE — Progress Notes (Signed)
[place] for Heparin Indication: atrial fibrillation, VTE prophlaxis s/p R TKA  Allergies  Allergen Reactions  . Other Anaphylaxis    BEE STINGS- CLOSE OFF THROAT  . Penicillins Anaphylaxis    "CLOSES OFF MY BREATHING"  . Pneumococcal Vaccines Other (See Comments)    PP-23 vaccine; had BIG local red reaction with heat.   Jaclyn Prime [Ibandronate Sodium]     Jaw popping  . Ceclor [Cefaclor] Other (See Comments)    Reaction=burning all over  . Codeine Nausea And Vomiting  . Cortisone Hives    All over body   Patient Measurements: Height: 5' 0.5" (153.7 cm) Weight: 187 lb 13.3 oz (85.2 kg) IBW/kg (Calculated) : 46.65 Heparin Dosing Weight: 66 kg  Vital Signs: Temp: 98.1 F (36.7 C) (11/20 1600) Temp Source: Oral (11/20 1600) BP: 79/60 mmHg (11/20 1600) Pulse Rate: 84 (11/20 1600)  Labs:  Recent Labs  06/19/14 1255 06/19/14 1256 06/19/14 1622 06/19/14 1629 06/19/14 2311 06/20/14 0512 06/20/14 1749  HGB 11.7* 11.6*  --  13.8  --  13.7  --   HCT 35.1* 34.0*  --  42.0  --  40.8  --   PLT 192  --   --  302  --  246  --   HEPARINUNFRC  --   --   --   --   --   --  0.49  CREATININE  --   --   --  0.92  --  0.82  --   CKTOTAL  --   --  151  --   --   --   --   CKMB  --   --  3.3  --   --   --   --   TROPONINI  --   --  0.67*  --  1.30* 1.88*  --    Estimated Creatinine Clearance: 70.6 mL/min (by C-G formula based on Cr of 0.82).  Medical History: Past Medical History  Diagnosis Date  . Insomnia, unspecified   . Esophageal reflux   . Hyperlipemia   . Asthma   . Osteoporosis   . Arthritis   . COPD (chronic obstructive pulmonary disease)   . Depression   . Personal history of colonic polyps 09/2010    TUBULAR ADENOMAS (X3); NEGATIVE FOR HIGH GRADE DYSPLASIA OR MALIGNANCY.  . Barrett esophagus   . Hiatal hernia   . Esophageal stricture   . GERD (gastroesophageal reflux disease)   . GAD (generalized anxiety disorder)   .  Aortic valve disorders   . Heart murmur   . Anxiety   . Shortness of breath     with exertion   . Pneumonia     hx of   . Type II or unspecified type diabetes mellitus without mention of complication, not stated as uncontrolled     on no meds   . Anemia     hx of years ago   . Hypertension 12/07/2012  . Myocardial infarction 01-08-13    '06-Chest pain-no stent-dx. MI-stress related  . History of kidney stones 01-08-13    past hx.  . Transfusion history 01-08-13    past hx. many yrs ago  . Dysrhythmia     heart skips per pt   . 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 Urinary frequency     AND INCONTINENCE   Medications:  Scheduled:  . antiseptic oral rinse  7  mL Mouth Rinse QID  . [START ON 06/21/2014] aspirin  325 mg Oral Daily  . atorvastatin  40 mg Oral QHS  . chlorhexidine  15 mL Mouth Rinse BID  . fentaNYL   Intravenous 6 times per day  . fluticasone  2 spray Each Nare Daily  . [START ON 06/21/2014] insulin aspart  0-15 Units Subcutaneous TID WC  . insulin glargine  10 Units Subcutaneous Daily  . mometasone-formoterol  1 puff Inhalation BID  . montelukast  10 mg Oral QHS  . pantoprazole  40 mg Oral Daily  . PARoxetine  20 mg Oral QHS   Infusions:  . amiodarone 40 mg/hr (06/20/14 1530)  . heparin 1,000 Units/hr (06/20/14 1141)   Assessment: 63 year old female s/p R TKA on 11/19 who developed flash pulmonary edema, refractory hypoxemia and heavy vent support post-op. Patient was initially ordered to start Xarelto 10mg  daily today for post-of VTE prophylaxis, but now plan is to start IV heparin and continue amiodarone drip since patient also developed Afib with RVR.   Baseline coags wnl preop (aPTT 33, PT 12.9, INR 0.91  CBC today stable from yesterday  SCr wnl, CrCl 70 ml/min,No current bleeding reported  Heparin bolus 2000 units, infusion at 1000 units/hr  Heparin levels: 11/20 @ 1749 = 0.49, in goal range  Goal of Therapy:  Heparin level  0.3-0.7 units/ml Monitor platelets by anticoagulation protocol: Yes   Plan:   Continue Heparin infusion at 1000 units/hr  Check heparin level in 6hrs  Daily heparin level, CBC  Thank you for the consult,  Minda Ditto PharmD Pager [phone] 06/20/2014, 7:04 PM

## 2014-06-20 NOTE — Progress Notes (Signed)
CRITICAL VALUE ALERT  Critical value received:  Troponin 1.3  Date of notification:  11/20  Time of notification:  0000  Critical value read back:Yes.    Nurse who received alert:  Avon Gully, RN--notified primary RN Shirlee Limerick

## 2014-06-20 NOTE — Telephone Encounter (Signed)
Called pt at # provided above received same msg as stated below.  ATC pt at [phone], spoke with pt's mother who reports pt is currently in ICU at [facility].  States pt went into hospital for a knee replacement and "lung filled up with fluid all of a sudden."  Mom would like chantix maintanece rx to be sent to [facility] to be available for when pt is d/c'd.  Rx sent - mother aware and is aware I will pass msg to CY regarding pt's admission.  She verbalized understanding and voiced no further questions or concerns at this time.

## 2014-06-20 NOTE — Telephone Encounter (Signed)
Ok to send rx for Chantix maintenance kit with one refill as requested

## 2014-06-20 NOTE — Progress Notes (Signed)
ANTICOAGULATION CONSULT NOTE - Initial Consult  Pharmacy Consult for Heparin Indication: atrial fibrillation, VTE prophlaxis s/p R TKA  Allergies  Allergen Reactions  . Other Anaphylaxis    BEE STINGS- CLOSE OFF THROAT  . Penicillins Anaphylaxis    "CLOSES OFF MY BREATHING"  . Pneumococcal Vaccines Other (See Comments)    PP-23 vaccine; had BIG local red reaction with heat.   Jaclyn Prime [Ibandronate Sodium]     Jaw popping  . Ceclor [Cefaclor] Other (See Comments)    Reaction=burning all over  . Codeine Nausea And Vomiting  . Cortisone Hives    All over body    Patient Measurements: Height: 5' 0.5" (153.7 cm) Weight: 187 lb 13.3 oz (85.2 kg) IBW/kg (Calculated) : 46.65 Heparin Dosing Weight: 66 kg  Vital Signs: Temp: 100.1 F (37.8 C) (11/20 0800) Temp Source: Oral (11/20 0800) BP: 95/59 mmHg (11/20 0800) Pulse Rate: 104 (11/20 0800)  Labs:  Recent Labs  06/19/14 1255 06/19/14 1256 06/19/14 1622 06/19/14 1629 06/19/14 2311 06/20/14 0512  HGB 11.7* 11.6*  --  13.8  --  13.7  HCT 35.1* 34.0*  --  42.0  --  40.8  PLT 192  --   --  302  --  246  CREATININE  --   --   --  0.92  --  0.82  CKTOTAL  --   --  151  --   --   --   CKMB  --   --  3.3  --   --   --   TROPONINI  --   --  0.67*  --  1.30* 1.88*    Estimated Creatinine Clearance: 70.6 mL/min (by C-G formula based on Cr of 0.82).   Medical History: Past Medical History  Diagnosis Date  . Insomnia, unspecified   . Esophageal reflux   . Hyperlipemia   . Asthma   . Osteoporosis   . Arthritis   . COPD (chronic obstructive pulmonary disease)   . Depression   . Personal history of colonic polyps 09/2010    TUBULAR ADENOMAS (X3); NEGATIVE FOR HIGH GRADE DYSPLASIA OR MALIGNANCY.  . Barrett esophagus   . Hiatal hernia   . Esophageal stricture   . GERD (gastroesophageal reflux disease)   . GAD (generalized anxiety disorder)   . Aortic valve disorders   . Heart murmur   . Anxiety   . Shortness of breath      with exertion   . Pneumonia     hx of   . Type II or unspecified type diabetes mellitus without mention of complication, not stated as uncontrolled     on no meds   . Anemia     hx of years ago   . Hypertension 12/07/2012  . Myocardial infarction 01-08-13    '06-Chest pain-no stent-dx. MI-stress related  . History of kidney stones 01-08-13    past hx.  . Transfusion history 01-08-13    past hx. many yrs ago  . Dysrhythmia     heart skips per pt   . 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 Urinary frequency     AND INCONTINENCE    Medications:  Scheduled:  . antiseptic oral rinse  7 mL Mouth Rinse QID  . [START ON 06/21/2014] aspirin  325 mg Oral Daily  . atorvastatin  40 mg Oral QHS  . chlorhexidine  15 mL Mouth Rinse BID  . fentaNYL   Intravenous  6 times per day  . fluticasone  2 spray Each Nare Daily  . insulin glargine  10 Units Subcutaneous Daily  . mometasone-formoterol  1 puff Inhalation BID  . montelukast  10 mg Oral QHS  . pantoprazole  40 mg Oral Daily  . PARoxetine  20 mg Oral QHS   Infusions:  . amiodarone 60 mg/hr (06/20/14 0532)   Followed by  . amiodarone     PRN: sodium chloride, acetaminophen **OR** [DISCONTINUED] acetaminophen, albuterol, ALPRAZolam, diphenhydrAMINE **OR** diphenhydrAMINE, docusate **OR** docusate, naloxone **AND** sodium chloride, ondansetron (ZOFRAN) IV, perflutren lipid microspheres (DEFINITY) IV suspension, phenol  Assessment: 63 year old female s/p R TKA on 11/19 who developed flash pulmonary edema, refractory hypoxemia and heavy vent support post-op. Patient was initially ordered to start Xarelto 10mg  daily today for post-of VTE prophylaxis, but now plan is to start IV heparin and continue amiodarone drip since patient also developed Afib with RVR.   Baseline coags wnl preop (aPTT 33, PT 12.9, INR 0.91  CBC today stable from yesterday  SCr wnl, CrCl 70 ml/min  No current bleeding reported  Goal of  Therapy:  Heparin level 0.3-0.7 units/ml Monitor platelets by anticoagulation protocol: Yes   Plan:   Heparin 2000 units IV bolus  Heparin 1000 units/hr  Check heparin level in 6hrs  Daily heparin level, CBC  Peggyann Juba, PharmD, BCPS Pager: [phone] 06/20/2014,10:21 AM

## 2014-06-20 NOTE — Progress Notes (Signed)
PULMONARY / CRITICAL CARE MEDICINE   Name: [name] [name] MRN: [id number] DOB: April 10, 1951    ADMISSION DATE:  06/19/2014 CONSULTATION DATE:  06/19/2014  REFERRING MD :  Gladstone Lighter - Ortho  CHIEF COMPLAINT:  Respiratory failure  INITIAL PRESENTATION:  63 yo female former smoker s/p Rt knee arthroscopy 11/19.  Developed pulmonary edema with VDRF post-op.  STUDIES:  11/05/12 PFT >> FEV1 1.76 (91%), FEV1% 81, TLC 93%, DCLO 68% 06/20/14 Echo >>  SIGNIFICANT EVENTS: 11/19 intubated to ICU 11/20 A fib with RVR, troponin elevation >> cardiology consulted  SUBJECTIVE:  Tolerated SBT.  VITAL SIGNS: Temp:  [97.6 F (36.4 C)-100.1 F (37.8 C)] 100.1 F (37.8 C) (11/20 0800) Pulse Rate:  [50-119] 104 (11/20 0800) Resp:  [16-23] 21 (11/20 0800) BP: (89-141)/(59-107) 95/59 mmHg (11/20 0800) SpO2:  [84 %-100 %] 98 % (11/20 0800) FiO2 (%):  [40 %-100 %] 40 % (11/20 0852) Weight:  [178 lb (80.74 kg)-187 lb 13.3 oz (85.2 kg)] 187 lb 13.3 oz (85.2 kg) (11/20 0500) VENTILATOR SETTINGS: Vent Mode:  [-] PCV FiO2 (%):  [40 %-100 %] 40 % Set Rate:  [20 bmp] 20 bmp Vt Set:  [300 mL] 300 mL PEEP:  [12 cmH20] 12 cmH20 Plateau Pressure:  [21 cmH20-26 cmH20] 24 cmH20 INTAKE / OUTPUT:  Intake/Output Summary (Last 24 hours) at 06/20/14 0936 Last data filed at 06/20/14 0600  Gross per 24 hour  Intake 1158.45 ml  Output   2200 ml  Net -1041.55 ml    PHYSICAL EXAMINATION: General: no distress Neuro: RASS 0, normal strength HEENT: ETT in place Cardiovascular: irregular Lungs: no wheeze Abdomen:  Soft, non-distended Musculoskeletal: Rt knee dressing clean Skin: no rash  LABS:  CBC  Recent Labs Lab 06/19/14 1255 06/19/14 1256 06/19/14 1629 06/20/14 0512  WBC 7.3  --  18.2* 14.7*  HGB 11.7* 11.6* 13.8 13.7  HCT 35.1* 34.0* 42.0 40.8  PLT 192  --  302 246   Coag's No results for input(s): APTT, INR in the last 168 hours.   BMET  Recent Labs Lab 06/19/14 1256  06/19/14 1629 06/20/14 0512  NA 140 137 140  K 3.2* 3.6* 3.2*  CL  --  97 96  CO2  --  22 22  BUN  --  18 15  CREATININE  --  0.92 0.82  GLUCOSE 128* 448* 188*   Electrolytes  Recent Labs Lab 06/19/14 1629 06/20/14 0512  CALCIUM 8.2* 8.4  MG 1.8 1.5  PHOS 6.7* 3.1   ABG  Recent Labs Lab 06/19/14 1812 06/19/14 2010 06/20/14 0405  PHART 7.264* 7.340* 7.507*  PCO2ART 50.6* 42.0 31.0*  PO2ART 119.0* 100.0 191.0*   Liver Enzymes  Recent Labs Lab 06/19/14 1629  AST 67*  ALT 73*  ALKPHOS 86  BILITOT 0.3  ALBUMIN 3.4*   Cardiac Enzymes  Recent Labs Lab 06/19/14 1622 06/19/14 1629 06/19/14 2311 06/20/14 0512  TROPONINI 0.67*  --  1.30* 1.88*  PROBNP  --  108.0  --   --    Glucose  Recent Labs Lab 06/19/14 2331 06/20/14 0037 06/20/14 0141 06/20/14 0244 06/20/14 0434 06/20/14 0815  GLUCAP 213* 193* 163* 121* 168* 238*    Imaging Dg Chest Port 1 View  06/19/2014   CLINICAL DATA:  Endotracheal tube.  NG tube.  EXAM: PORTABLE CHEST - 1 VIEW  COMPARISON:  06/19/2014 at 1620 hr  FINDINGS: Endotracheal tube has been withdrawn since the prior study. The tip now projects approximately 5-10 mm above the carina.  Enteric tube courses into the left upper abdomen with tip not imaged. The cardiomediastinal silhouette is unchanged. Diffuse bilateral lung opacities, greatest in the perihilar regions and lung bases, do not appear significantly changed. No pleural effusion or pneumothorax is identified.  IMPRESSION: 1. Interval retraction of endotracheal tube as above. Recommend retracting an additional 2-3 cm. 2. Similar appearance bilateral pulmonary opacities consistent with edema.   Electronically Signed   By: Logan Bores   On: 06/19/2014 17:48   Dg Chest Port 1 View  06/19/2014   CLINICAL DATA:  Pulmonary edema.  EXAM: PORTABLE CHEST - 1 VIEW  COMPARISON:  May 07, 2014.  FINDINGS: Stable cardiomediastinal silhouette. Nasogastric tube is seen entering the stomach.  Endotracheal tube is seen directed into the right mainstem bronchus ; is is recommended to be withdrawn at least 4 cm. There is interval development of bilateral perihilar and basilar opacities concerning for pulmonary edema. No pneumothorax or significant pleural effusion is noted at this time.  IMPRESSION: Interval development of moderate bilateral perihilar and basilar pulmonary edema.  Nasogastric tube is seen directed in the right mainstem bronchus; it is recommended it be withdrawn at least 4 cm. Critical Value/emergent results were called by telephone at the time of interpretation on 06/19/2014 at 4:42 pm to [facility], LPN, who verbally acknowledged these results.   Electronically Signed   By: Sabino Dick M.D.   On: 06/19/2014 16:43     ASSESSMENT / PLAN:  PULMONARY OETT 11/19 A: Acute hypoxic respiratory failure 2nd to acute pulmonary edema. Hx of chronic bronchitis, allergic rhinitis. P:   Proceed with extubation 11/20 PRN BD's F/u CXR intermittently Resume dulera, singulair, flonase after extubation D/c lasix gtt 11/20 >> goal even fluid balance  CARDIOVASCULAR A:  A fib with RVR 11/20. Troponin elevation. Hx of CAD, HLD P:  F/u Echo Cardiology consulted 11/20 ASA, lipitor Continue amiodarone Heparin gtt per pharmacy  RENAL A:   Hypokalemia. P:   F/u and replace electrolytes as needed  GASTROINTESTINAL A:   H/o GERD, Barrett's esophagus. P:   Protonix for SUP Advance diet after extubation  HEMATOLOGIC A:   Mild anemia P:  Follow CBC  INFECTIOUS A:  No evidence for infection. P:   Monitor clinically  ENDOCRINE A:   DM type II. P:   CBG and SSI Hold outpt metformin for now  NEUROLOGIC A:   Acute encephalopathy in setting of hypoxemia >> resolved. Hx of Depression, insomnia, anxiety. P:   Fentanyl PCA Resume paroxetine, alprazolam   ORTHOPEDICS A: S/p Rt knee arthroplasty. P: Per ortho   FAMILY  - Updates:   -  Inter-disciplinary family meet or Palliative Care meeting due by:  11/26  SUMMARY: Respiratory status improved >> proceed with extubation.  Consulted cardiology to assess A fib, troponin leak.  Okay to start heparin gtt per ortho.    CC time 35 minutes.  Chesley Mires, MD [facility] Pulmonary/Critical Care 06/20/2014, 10:23 AM Pager:  [phone] After 3pm call: [phone]

## 2014-06-20 NOTE — Telephone Encounter (Signed)
Attempted to call the pt again and the same message played.  Will sign off of message at this time and wait for pt to call back.

## 2014-06-20 NOTE — Procedures (Signed)
Extubation Procedure Note  Patient Details:   Name: [name] [name] DOB: [birth year] MRN: [id number]   Airway Documentation:  Airway 8 mm (Active)  Secured at (cm) 22 cm 06/20/2014  8:52 AM  Measured From Lips 06/20/2014  8:52 AM  [place] 06/20/2014  8:52 AM  Secured By [employer] 06/20/2014  8:52 AM  Tube Holder Repositioned Yes 06/20/2014  8:52 AM  Cuff Pressure (cm H2O) 25 cm H2O 06/20/2014  8:52 AM  Site Condition Dry 06/19/2014  6:00 PM    Evaluation  O2 sats: 93 Complications: none Patient tolerated procedure well. Bilateral Breath Sounds: Clear Suctioning: Oral Pt able to talk  Per CCM order, pt extubated, placed on 4L nasal cannula.  Pt tolerated well, no complications.   Martha Clan 06/20/2014, 10:26 AM

## 2014-06-20 NOTE — Progress Notes (Signed)
CRITICAL VALUE ALERT  Critical value received:  Trop 1.88  Date of notification:  06/20/2014  Time of notification:  0633  Critical value read back: yes  Nurse who received alert:  Irene Pap, RN  MD notified (1st page):  Called Dr. Alva Garnet.   Time of first page:  [phone]  MD notified (2nd page):  Time of second page:  Responding MD:  Spoke with Dr. Alva Garnet. Time MD responded: [phone]

## 2014-06-20 NOTE — Progress Notes (Signed)
[place] Progress Note Patient Name: [name] [name] DOB: 1951-03-31 MRN: [id number]   Date of Service  06/20/2014  HPI/Events of Note  AFRVR Mildly elevated trop I  eICU Interventions  Amiodarone load and bolus EKG pending Aspirin ordered Echocardiogram ordered Consider Cards consult in AM 11/20     Intervention Category Major Interventions: Arrhythmia - evaluation and management  Merton Border 06/20/2014, 5:25 AM

## 2014-06-20 NOTE — Consult Note (Signed)
CARDIOLOGY CONSULT NOTE   Patient ID: KIMARIA STRUTHERS MRN: [id number] DOB/AGE: 10-29-1950 63 y.o.  Admit Date: 06/19/2014  Primary Physician: Velna Hatchet, MD  Primary Cardiologist    [place]   Clinical Summary Ms. Asfaw is a 63 y.o.female. She has a history of significant lung disease. Her cardiac status has been stable. Historically she has had a normal EKG. She has been seen by Dr. Acie Fredrickson over many years. In the past she had normal cardiac catheterization in 2002 and 2006. She had low normal left ventricular function and no significant coronary disease. She was seen in the cardiology office most recently in October, 2015. Her EKG had not changed. She had vague chest discomfort that she's had over many years. No further workup was necessary at that time. There has been no assessment of left ventricular function since her catheterization in 2006.  The patient was admitted and had knee surgery. Shortly after extubation she went into pulmonary edema and was reintubated. There is consideration that she may have had negative pressure pulmonary edema. She has been diuresing and improving rapidly. She did develop rapid atrial fibrillation yesterday. She has been treated with IV amiodarone and converted back to sinus rhythm. She is extubated. Her troponin has gone up to 1.88. EKG this admission shows that her QRS is wider than baseline. It has the appearance of an incomplete left bundle-branch block. There is decreased anterior R-wave progression. There is no EKG for comparison on the days prior to surgery. There is an EKG from October, 2015 that had shown a normal QRS with no abnormalities. Two-dimensional echo had just been done is I reach the patient's bedside. It is technically difficult. However there is severe global left jugular dysfunction. Ejection fraction is no more than 25%. There is question that the base may move better than the mid ventricle and apex. However this is a borderline  call.     Allergies  Allergen Reactions  . Other Anaphylaxis    BEE STINGS- CLOSE OFF THROAT  . Penicillins Anaphylaxis    "CLOSES OFF MY BREATHING"  . Pneumococcal Vaccines Other (See Comments)    PP-23 vaccine; had BIG local red reaction with heat.   Jaclyn Prime [Ibandronate Sodium]     Jaw popping  . Ceclor [Cefaclor] Other (See Comments)    Reaction=burning all over  . Codeine Nausea And Vomiting  . Cortisone Hives    All over body    Medications Scheduled Medications: . antiseptic oral rinse  7 mL Mouth Rinse QID  . [START ON 06/21/2014] aspirin  325 mg Oral Daily  . atorvastatin  40 mg Oral QHS  . chlorhexidine  15 mL Mouth Rinse BID  . fentaNYL   Intravenous 6 times per day  . fluticasone  2 spray Each Nare Daily  . heparin  2,000 Units Intravenous Once  . insulin glargine  10 Units Subcutaneous Daily  . mometasone-formoterol  1 puff Inhalation BID  . montelukast  10 mg Oral QHS  . pantoprazole  40 mg Oral Daily  . PARoxetine  20 mg Oral QHS     Infusions: . amiodarone 60 mg/hr (06/20/14 0532)   Followed by  . amiodarone    . heparin       PRN Medications:  sodium chloride, acetaminophen **OR** [DISCONTINUED] acetaminophen, albuterol, ALPRAZolam, diphenhydrAMINE **OR** diphenhydrAMINE, docusate **OR** docusate, naloxone **AND** sodium chloride, ondansetron (ZOFRAN) IV, perflutren lipid microspheres (DEFINITY) IV suspension, phenol   Past Medical History  Diagnosis Date  .  Insomnia, unspecified   . Esophageal reflux   . Hyperlipemia   . Asthma   . Osteoporosis   . Arthritis   . COPD (chronic obstructive pulmonary disease)   . Depression   . Personal history of colonic polyps 09/2010    TUBULAR ADENOMAS (X3); NEGATIVE FOR HIGH GRADE DYSPLASIA OR MALIGNANCY.  . Barrett esophagus   . Hiatal hernia   . Esophageal stricture   . GERD (gastroesophageal reflux disease)   . GAD (generalized anxiety disorder)   . Aortic valve disorders   . Heart murmur     . Anxiety   . Shortness of breath     with exertion   . Pneumonia     hx of   . Type II or unspecified type diabetes mellitus without mention of complication, not stated as uncontrolled     on no meds   . Anemia     hx of years ago   . Hypertension 12/07/2012  . Myocardial infarction 01-08-13    '06-Chest pain-no stent-dx. MI-stress related  . History of kidney stones 01-08-13    past hx.  . Transfusion history 01-08-13    past hx. many yrs ago  . Dysrhythmia     heart skips per pt   . 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 Urinary frequency     AND INCONTINENCE    Past Surgical History  Procedure Laterality Date  . Cholecystectomy  2005  . Partial hysterectomy    . Shoulder surgery  2011  . Tubal ligation    . Tonsillectomy    . Foot surgery  2005    Left foot  . Wrist flexion tendon tenotomies and proximal corpectomy w/ wrist arthrodesis&iliac crest bone graft    . Cardiac catheterization      normal per Dr Acie Fredrickson in note dated 02/06/12   . Knee arthroscopy  02/28/2012    Procedure: ARTHROSCOPY KNEE;  Surgeon: Tobi Bastos, MD;  Location: [facility];  Service: Orthopedics;  Laterality: Left;  . Esophageal dilation  01-08-13    2 yrs ago  . Cataract extraction, bilateral    . Total knee arthroplasty Left 01/17/2013    Procedure: LEFT TOTAL KNEE ARTHROPLASTY;  Surgeon: Tobi Bastos, MD;  Location: [facility];  Service: Orthopedics;  Laterality: Left;    Family History  Problem Relation Age of Onset  . Breast cancer Mother   . Diabetes Maternal Grandfather   . Kidney disease      Both sides of family  . Heart disease      Both sides of family  . Colon cancer Brother 70  . Colon polyps Brother   . Breast cancer Maternal Aunt   . Breast cancer Maternal Grandmother     Social History Ms. Pendergraph reports that she quit smoking about a year ago. Her smoking use included Cigarettes. She has a 10 pack-year smoking history. She quit smokeless tobacco use  about 2 years ago. Her smokeless tobacco use included Snuff and Chew. Ms. Forgione reports that she drinks about 0.6 oz of alcohol per week.  Review of Systems Patient denies fever, chills, headache, sweats, rash, change in vision, change in hearing, chest pain, cough, nausea or vomiting, urinary symptoms. All other systems are reviewed and are negative.  Physical Examination Blood pressure 95/59, pulse 104, temperature 100.1 F (37.8 C), temperature source Oral, resp. rate 21, height 5' 0.5" (1.537 m), weight 187 lb 13.3 oz (  85.2 kg), SpO2 98 %.  Intake/Output Summary (Last 24 hours) at 06/20/14 1116 Last data filed at 06/20/14 0600  Gross per 24 hour  Intake 1158.45 ml  Output   2200 ml  Net -1041.55 ml   Patient is oriented to person time and place. Affect is normal. She is comfortable in bed at this time. Head is atraumatic. Sclera and conjunctiva are normal. There is no jugular venous distention. Lungs reveal scattered rhonchi. There is no respiratory distress. Cardiac exam reveals S1 and S2. The abdomen is soft. There is no peripheral edema. There are no musculoskeletal deformities. There are no skin rashes.  Prior Cardiac Testing/Procedures  Lab Results  Basic Metabolic Panel:  Recent Labs Lab 06/19/14 1256 06/19/14 1629 06/20/14 0512  NA 140 137 140  K 3.2* 3.6* 3.2*  CL  --  97 96  CO2  --  22 22  GLUCOSE 128* 448* 188*  BUN  --  18 15  CREATININE  --  0.92 0.82  CALCIUM  --  8.2* 8.4  MG  --  1.8 1.5  PHOS  --  6.7* 3.1    Liver Function Tests:  Recent Labs Lab 06/19/14 1629  AST 67*  ALT 73*  ALKPHOS 86  BILITOT 0.3  PROT 6.5  ALBUMIN 3.4*    CBC:  Recent Labs Lab 06/19/14 1255 06/19/14 1256 06/19/14 1629 06/20/14 0512  WBC 7.3  --  18.2* 14.7*  NEUTROABS  --   --  12.2*  --   HGB 11.7* 11.6* 13.8 13.7  HCT 35.1* 34.0* 42.0 40.8  MCV 92.1  --  93.1 91.5  PLT 192  --  302 246    Cardiac Enzymes:  Recent Labs Lab 06/19/14 1622  06/19/14 2311 06/20/14 0512  CKTOTAL 151  --   --   CKMB 3.3  --   --   TROPONINI 0.67* 1.30* 1.88*    BNP: Invalid input(s): POCBNP   Radiology: Dg Chest Port 1 View  06/20/2014   CLINICAL DATA:  Intubated patient.  , pulmonary edema  EXAM: PORTABLE CHEST - 1 VIEW  COMPARISON:  Portable chest x-ray of June 19, 2014  FINDINGS: The lungs are well-expanded. The interstitial markings are less prominent. The pulmonary vascularity is more distinct today. There is no significant pleural effusion nor pneumothorax. The cardiopericardial silhouette is normal in size. The mediastinum is normal in width.  The endotracheal tube tip lies 3.5 cm above the crotch of the carina. The esophagogastric tube tip projects below the inferior margin of the image.  IMPRESSION: Improved appearance of the pulmonary interstitium consistent with resolving edema. There is no evidence of pneumonia nor significant pleural effusion. There has been interval withdrawal of the endotracheal tube such that its tip now lies 3.5 cm above the carina.   Electronically Signed   By: David  Martinique   On: 06/20/2014 07:45   Dg Chest Port 1 View  06/19/2014   CLINICAL DATA:  Endotracheal tube.  NG tube.  EXAM: PORTABLE CHEST - 1 VIEW  COMPARISON:  06/19/2014 at 1620 hr  FINDINGS: Endotracheal tube has been withdrawn since the prior study. The tip now projects approximately 5-10 mm above the carina. Enteric tube courses into the left upper abdomen with tip not imaged. The cardiomediastinal silhouette is unchanged. Diffuse bilateral lung opacities, greatest in the perihilar regions and lung bases, do not appear significantly changed. No pleural effusion or pneumothorax is identified.  IMPRESSION: 1. Interval retraction of endotracheal tube as above. Recommend  retracting an additional 2-3 cm. 2. Similar appearance bilateral pulmonary opacities consistent with edema.   Electronically Signed   By: Logan Bores   On: 06/19/2014 17:48   Dg Chest  Port 1 View  06/19/2014   CLINICAL DATA:  Pulmonary edema.  EXAM: PORTABLE CHEST - 1 VIEW  COMPARISON:  May 07, 2014.  FINDINGS: Stable cardiomediastinal silhouette. Nasogastric tube is seen entering the stomach. Endotracheal tube is seen directed into the right mainstem bronchus ; is is recommended to be withdrawn at least 4 cm. There is interval development of bilateral perihilar and basilar opacities concerning for pulmonary edema. No pneumothorax or significant pleural effusion is noted at this time.  IMPRESSION: Interval development of moderate bilateral perihilar and basilar pulmonary edema.  Nasogastric tube is seen directed in the right mainstem bronchus; it is recommended it be withdrawn at least 4 cm. Critical Value/emergent results were called by telephone at the time of interpretation on 06/19/2014 at 4:42 pm to [facility], LPN, who verbally acknowledged these results.   Electronically Signed   By: Sabino Dick M.D.   On: 06/19/2014 16:43    ECG: The last EKG before admission in October, 2015 revealed normal sinus rhythm with a narrow QRS and no significant abnormalities. EKG on June 19, 2014 reveals that the QRS has increased in duration. There is an incomplete left bundle branch block. There is decreased anterior R-wave progression. Cannot rule out anterior MI of indeterminate age.  Telemetry I have reviewed telemetry today June 20, 2014. Currently there is sinus rhythm. Her episode of atrial fibrillation yesterday is recorded. The rate was in the range of 160.   Impression and Recommendations    Total knee replacement status      The patient had successful knee surgery this admission. She is recovering.    Respiratory failure requiring intubation   Acute pulmonary edema     The patient is improving. She is no longer intubated. She is diuresing and improving. Etiology of her pulmonary edema may be multifactorial. I do not think that she's had a ST elevation MI. She  currently has left ventricular dysfunction. But it is not completely clear when this occurred. There may have been a component also of negative pressure pulmonary edema after extubation. The plan for the treatment of her pulmonary edema will be continued careful diuresis.       Atrial fibrillation with RVR      The patient had an episode of rapid atrial fibrillation yesterday. She is converted to sinus rhythm. For now during her acute illness, plan to continue IV amiodarone. Decisions about chronic anticoagulation can be made at a later date. Up to this point there has been no history of atrial fibrillation.    Congestive dilated cardiomyopathy     The patient has severe left ventricular dysfunction by echo today. It is possible that the base moves better than the mid ventricle and the apex. It is possible that the appearance of this ventricle could be from a Takot-subo event. We will have to follow her LV function with early repeat echo. At the same time we will need to start medications as they can be tolerated including carvedilol and ACE inhibitor. Left ventricular function was normal in the past. There has not been reassessment her her LV function in recent years. Therefore we cannot say when she developed her left ventricular dysfunction. It will be appropriate to proceed with cardiac catheterization during this admission when she is completely stable. Her blood  pressure is borderline. Her pulmonary status is borderline. I'm hesitant to start either an ACE inhibitor or a beta blocker at this point today. I'm hopeful that these can be started when the pulmonary team feels that this is possible.    Demand ischemia of myocardium    She has had troponin elevation up to 1.8. This does not seem high enough to account for her left ventricular dysfunction. I suspect that this is related to demand ischemia from her acute pulmonary edema and atrial fibrillation.   Daryel November, MD 06/20/2014, 11:16 AM

## 2014-06-20 NOTE — Progress Notes (Signed)
PT Cancellation Note  Patient Details Name: [name] [name] MRN: [id number] DOB: 1951/06/20   Cancelled Treatment:    Reason Eval/Treat Not Completed: Medical issues which prohibited therapy (pt extubated today 11/20, elevated troponins and cardiology consult)   Trena Platt 06/20/2014, 10:46 AM Carmelia Bake, PT, DPT 06/20/2014 Pager: [phone]

## 2014-06-20 NOTE — Progress Notes (Signed)
OT Cancellation Note  Patient Details Name: [name] [name] MRN: [id number] DOB: Nov 20, 1950   Cancelled Treatment:    Reason Eval/Treat Not Completed: Patient not medically ready -  Pt with elevated Troponins and currently, on vent.  Will initiate OT eval when medically ready.  Darlina Rumpf [facility], OTR/L [phone]  06/20/2014, 9:11 AM

## 2014-06-20 NOTE — Progress Notes (Signed)
Inpatient Diabetes Program Recommendations  AACE/ADA: New Consensus Statement on Inpatient Glycemic Control (2013)  Target Ranges:  Prepandial:   less than 140 mg/dL      Peak postprandial:   less than 180 mg/dL (1-2 hours)      Critically ill patients:  140 - 180 mg/dL   Reason for Visit: Hyperglycemia  Diabetes history: DM2 Outpatient Diabetes medications: metformin 500 bid Current orders for Inpatient glycemic control: Lantus 10 units QD  Results for [name] [name], [name] [name] (MRN [id number]) as of 06/20/2014 09:47  Ref. Range 06/20/2014 00:37 06/20/2014 01:41 06/20/2014 02:44 06/20/2014 04:34 06/20/2014 08:15  Glucose-Capillary Latest Range: 70-99 mg/dL 193 (H) 163 (H) 121 (H) 168 (H) 238 (H)   Needs correction insulin. Consider ICU Hyperglycemia Protocol.  Inpatient Diabetes Program Recommendations Insulin - Basal: On Lantus 10 units QD Correction (SSI): Please consider Novolog sensitive Q4 while NPO  Check HgbA1C to assess glycemic control prior to hospitalization.  Will continue to follow. Thank you. Lorenda Peck, RD, LDN, CDE Inpatient Diabetes Coordinator [phone]

## 2014-06-20 NOTE — Progress Notes (Signed)
2355: Called Dr. Alva Garnet about pt's RASS score. Pt agitated still with versed and fentanyl pushes.  Dr. Alva Garnet ordered fentanyl drip to maintain RASS -1.   0200: Called Dr. Alva Garnet with critical Lab Triponin 1.3.

## 2014-06-20 NOTE — Progress Notes (Signed)
Echocardiogram 2D Echocardiogram with Definity has been performed.  [name] [name] 06/20/2014, 11:48 AM

## 2014-06-20 NOTE — Progress Notes (Signed)
[place] Progress Note Patient Name: [name] [name] DOB: 1951-05-09 MRN: [id number]   Date of Service  06/20/2014  HPI/Events of Note    eICU Interventions  Orders for transition from insulin gtt to [place] entered     Intervention Category Major Interventions: Hyperglycemia - active titration of insulin therapy  Merton Border 06/20/2014, 3:05 AM

## 2014-06-21 ENCOUNTER — Inpatient Hospital Stay (HOSPITAL_COMMUNITY): Payer: Medicaid Other

## 2014-06-21 DIAGNOSIS — I5181 Takotsubo syndrome: Secondary | ICD-10-CM

## 2014-06-21 DIAGNOSIS — Z96651 Presence of right artificial knee joint: Secondary | ICD-10-CM

## 2014-06-21 DIAGNOSIS — I248 Other forms of acute ischemic heart disease: Secondary | ICD-10-CM

## 2014-06-21 DIAGNOSIS — J9690 Respiratory failure, unspecified, unspecified whether with hypoxia or hypercapnia: Secondary | ICD-10-CM

## 2014-06-21 DIAGNOSIS — J449 Chronic obstructive pulmonary disease, unspecified: Secondary | ICD-10-CM

## 2014-06-21 LAB — BASIC METABOLIC PANEL
ANION GAP: 12 (ref 5–15)
Anion gap: 15 (ref 5–15)
BUN: 12 mg/dL (ref 6–23)
BUN: 11 mg/dL (ref 6–23)
CHLORIDE: 91 meq/L — AB (ref 96–112)
CO2: 28 meq/L (ref 19–32)
CO2: 26 meq/L (ref 19–32)
Calcium: 7.5 mg/dL — ABNORMAL LOW (ref 8.4–10.5)
Calcium: 7.8 mg/dL — ABNORMAL LOW (ref 8.4–10.5)
Chloride: 87 mEq/L — ABNORMAL LOW (ref 96–112)
Creatinine, Ser: 0.67 mg/dL (ref 0.50–1.10)
Creatinine, Ser: 0.61 mg/dL (ref 0.50–1.10)
GFR calc Af Amer: >90 mL/min (ref >90)
GFR calc non Af Amer: >90 mL/min (ref >90)
GFR calc non Af Amer: >90 mL/min (ref >90)
GLUCOSE: 208 mg/dL — AB (ref 70–99)
Glucose, Bld: 211 mg/dL — ABNORMAL HIGH (ref 70–99)
POTASSIUM: 3.1 meq/L — AB (ref 3.7–5.3)
POTASSIUM: 3.2 meq/L — AB (ref 3.7–5.3)
Sodium: 127 mEq/L — ABNORMAL LOW (ref 137–147)
Sodium: 132 mEq/L — ABNORMAL LOW (ref 137–147)

## 2014-06-21 LAB — GLUCOSE, CAPILLARY
GLUCOSE-CAPILLARY: 185 mg/dL — AB (ref 70–99)
GLUCOSE-CAPILLARY: 156 mg/dL — AB (ref 70–99)
Glucose-Capillary: 197 mg/dL — ABNORMAL HIGH (ref 70–99)
Glucose-Capillary: 213 mg/dL — ABNORMAL HIGH (ref 70–99)
Glucose-Capillary: 141 mg/dL — ABNORMAL HIGH (ref 70–99)

## 2014-06-21 LAB — CBC
HCT: 32 % — ABNORMAL LOW (ref 36.0–46.0)
Hemoglobin: 11.2 g/dL — ABNORMAL LOW (ref 12.0–15.0)
MCH: 31.8 pg (ref 26.0–34.0)
MCHC: 35 g/dL (ref 30.0–36.0)
MCV: 90.9 fL (ref 78.0–100.0)
Platelets: 179 10*3/uL (ref 150–400)
RBC: 3.52 MIL/uL — AB (ref 3.87–5.11)
RDW: 13.7 % (ref 11.5–15.5)
WBC: 10.5 10*3/uL (ref 4.0–10.5)

## 2014-06-21 LAB — HEPARIN LEVEL (UNFRACTIONATED): Heparin Unfractionated: 0.38 IU/mL (ref 0.30–0.70)

## 2014-06-21 MED ORDER — DIPHENHYDRAMINE HCL 12.5 MG/5ML PO ELIX: 12.5000 mg | ORAL_SOLUTION | Freq: Four times a day (QID) | ORAL | Status: DC | PRN

## 2014-06-21 MED ORDER — POTASSIUM CHLORIDE CRYS ER 20 MEQ PO TBCR
30.0000 meq | EXTENDED_RELEASE_TABLET | ORAL | Status: AC
  Administered 2014-06-21 (×2): 30 meq via ORAL
  Filled 2014-06-21 (×4): qty 1

## 2014-06-21 MED ORDER — AMIODARONE HCL IN DEXTROSE 360-4.14 MG/200ML-% IV SOLN
60.0000 mg/h | INTRAVENOUS | Status: AC
  Administered 2014-06-21 (×2): 60 mg/h via INTRAVENOUS
  Filled 2014-06-21: qty 200

## 2014-06-21 MED ORDER — DIPHENHYDRAMINE HCL 50 MG/ML IJ SOLN
12.5000 mg | Freq: Four times a day (QID) | INTRAMUSCULAR | Status: DC | PRN
Start: 2014-06-21 — End: 2014-06-22

## 2014-06-21 MED ORDER — FUROSEMIDE 10 MG/ML IJ SOLN
40.0000 mg | Freq: Once | INTRAMUSCULAR | Status: AC
  Administered 2014-06-21: 40 mg via INTRAVENOUS
  Filled 2014-06-21: qty 4

## 2014-06-21 MED ORDER — FAMOTIDINE IN NACL 20-0.9 MG/50ML-% IV SOLN
20.0000 mg | INTRAVENOUS | Status: DC
  Administered 2014-06-21: 20 mg via INTRAVENOUS
  Filled 2014-06-21: qty 50

## 2014-06-21 MED ORDER — ONDANSETRON HCL 4 MG/2ML IJ SOLN
INTRAMUSCULAR | Status: AC
  Filled 2014-06-21: qty 2

## 2014-06-21 MED ORDER — METFORMIN HCL 500 MG PO TABS
500.0000 mg | ORAL_TABLET | Freq: Every day | ORAL | Status: DC
  Administered 2014-06-21 – 2014-06-22 (×2): 500 mg via ORAL
  Filled 2014-06-21 (×4): qty 1

## 2014-06-21 MED ORDER — ONDANSETRON HCL 4 MG/2ML IJ SOLN
4.0000 mg | Freq: Four times a day (QID) | INTRAMUSCULAR | Status: DC | PRN
  Administered 2014-06-21: 4 mg via INTRAVENOUS

## 2014-06-21 MED ORDER — POTASSIUM CHLORIDE CRYS ER 20 MEQ PO TBCR
20.0000 meq | EXTENDED_RELEASE_TABLET | Freq: Once | ORAL | Status: AC
  Administered 2014-06-21: 20 meq via ORAL
  Filled 2014-06-21: qty 1

## 2014-06-21 MED ORDER — SODIUM CHLORIDE 0.9 % IV SOLN
INTRAVENOUS | Status: AC
  Administered 2014-06-21: 15:00:00 via INTRAVENOUS

## 2014-06-21 MED ORDER — APIXABAN 5 MG PO TABS
5.0000 mg | ORAL_TABLET | Freq: Two times a day (BID) | ORAL | Status: DC
  Administered 2014-06-21 – 2014-06-24 (×7): 5 mg via ORAL
  Filled 2014-06-21 (×7): qty 1

## 2014-06-21 MED ORDER — AMIODARONE HCL IN DEXTROSE 360-4.14 MG/200ML-% IV SOLN
30.0000 mg/h | INTRAVENOUS | Status: DC
  Administered 2014-06-21: 30 mg/h via INTRAVENOUS
  Filled 2014-06-21: qty 200

## 2014-06-21 MED ORDER — ONDANSETRON HCL 4 MG/2ML IJ SOLN: 4.0000 mg | Freq: Four times a day (QID) | INTRAMUSCULAR | Status: DC | PRN

## 2014-06-21 MED ORDER — NALOXONE HCL 0.4 MG/ML IJ SOLN
0.4000 mg | INTRAMUSCULAR | Status: DC | PRN
Start: 2014-06-21 — End: 2014-06-22

## 2014-06-21 MED ORDER — LISINOPRIL 10 MG PO TABS
10.0000 mg | ORAL_TABLET | Freq: Every day | ORAL | Status: DC
  Administered 2014-06-21: 10 mg via ORAL
  Filled 2014-06-21: qty 1

## 2014-06-21 MED ORDER — SODIUM CHLORIDE 0.9 % IJ SOLN: 9.0000 mL | INTRAMUSCULAR | Status: DC | PRN

## 2014-06-21 MED ORDER — FENTANYL 10 MCG/ML IV SOLN
INTRAVENOUS | Status: DC
  Administered 2014-06-21: 14:00:00 via INTRAVENOUS
  Administered 2014-06-21: 75 ug via INTRAVENOUS
  Administered 2014-06-21: 15 ug via INTRAVENOUS
  Administered 2014-06-21: 120 ug/h via INTRAVENOUS
  Administered 2014-06-21: 60 ug via INTRAVENOUS
  Administered 2014-06-22 (×2): 30 ug via INTRAVENOUS
  Filled 2014-06-21 (×2): qty 50

## 2014-06-21 NOTE — Progress Notes (Signed)
[facility] for Heparin Indication: atrial fibrillation, VTE prophlaxis s/p R TKA  Allergies  Allergen Reactions  . Other Anaphylaxis    BEE STINGS- CLOSE OFF THROAT  . Penicillins Anaphylaxis    "CLOSES OFF MY BREATHING"  . Pneumococcal Vaccines Other (See Comments)    PP-23 vaccine; had BIG local red reaction with heat.   Jaclyn Prime [Ibandronate Sodium]     Jaw popping  . Ceclor [Cefaclor] Other (See Comments)    Reaction=burning all over  . Codeine Nausea And Vomiting  . Cortisone Hives    All over body   Patient Measurements: Height: 5' 0.5" (153.7 cm) Weight: 187 lb 13.3 oz (85.2 kg) IBW/kg (Calculated) : 46.65 Heparin Dosing Weight: 66 kg  Vital Signs: Temp: 98 F (36.7 C) (11/20 2329) Temp Source: Oral (11/20 2329) BP: 104/49 mmHg (11/21 0200) Pulse Rate: 76 (11/21 0200)  Labs:  Recent Labs  06/19/14 1622 06/19/14 1629 06/19/14 2311 06/20/14 0512 06/20/14 1749 06/21/14 0215  HGB  --  13.8  --  13.7  --  11.2*  HCT  --  42.0  --  40.8  --  32.0*  PLT  --  302  --  246  --  179  HEPARINUNFRC  --   --   --   --  0.49 0.38  CREATININE  --  0.92  --  0.82  --  0.67  CKTOTAL 151  --   --   --   --   --   CKMB 3.3  --   --   --   --   --   TROPONINI 0.67*  --  1.30* 1.88*  --   --    Estimated Creatinine Clearance: 72.4 mL/min (by C-G formula based on Cr of 0.67).  Medical History: Past Medical History  Diagnosis Date  . Insomnia, unspecified   . Esophageal reflux   . Hyperlipemia   . Asthma   . Osteoporosis   . Arthritis   . COPD (chronic obstructive pulmonary disease)   . Depression   . Personal history of colonic polyps 09/2010    TUBULAR ADENOMAS (X3); NEGATIVE FOR HIGH GRADE DYSPLASIA OR MALIGNANCY.  . Barrett esophagus   . Hiatal hernia   . Esophageal stricture   . GERD (gastroesophageal reflux disease)   . GAD (generalized anxiety disorder)   . Aortic valve disorders   . Heart murmur   . Anxiety   .  Shortness of breath     with exertion   . Pneumonia     hx of   . Type II or unspecified type diabetes mellitus without mention of complication, not stated as uncontrolled     on no meds   . Anemia     hx of years ago   . Hypertension 12/07/2012  . Myocardial infarction 01-08-13    '06-Chest pain-no stent-dx. MI-stress related  . History of kidney stones 01-08-13    past hx.  . Transfusion history 01-08-13    past hx. many yrs ago  . Dysrhythmia     heart skips per pt   . 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 Urinary frequency     AND INCONTINENCE   Medications:  Scheduled:  . antiseptic oral rinse  7 mL Mouth Rinse QID  . aspirin  325 mg Oral Daily  . atorvastatin  40 mg Oral QHS  . chlorhexidine  15 mL  Mouth Rinse BID  . fentaNYL   Intravenous 6 times per day  . fluticasone  2 spray Each Nare Daily  . insulin aspart  0-15 Units Subcutaneous TID WC  . insulin glargine  10 Units Subcutaneous Daily  . mometasone-formoterol  1 puff Inhalation BID  . montelukast  10 mg Oral QHS  . pantoprazole  40 mg Oral Daily  . PARoxetine  20 mg Oral QHS  . potassium chloride  30 mEq Oral Q4H   Infusions:  . amiodarone 40 mg/hr (06/20/14 2122)  . heparin 1,000 Units/hr (06/20/14 1141)   Assessment: 63 year old female s/p R TKA on 11/19 who developed flash pulmonary edema, refractory hypoxemia and heavy vent support post-op. Patient was initially ordered to start Xarelto 10mg  daily today for post-of VTE prophylaxis, but now plan is to start IV heparin and continue amiodarone drip since patient also developed Afib with RVR.   Baseline coags wnl preop (aPTT 33, PT 12.9, INR 0.91  CBC today stable from yesterday  SCr wnl, CrCl 70 ml/min,No current bleeding reported  Heparin bolus 2000 units, infusion at 1000 units/hr  Heparin levels: 11/20 @ 1749 = 0.49, in goal range 11/21 @ 02:15 = 0.38 (therapeutic)  Goal of Therapy:  Heparin level 0.3-0.7 units/ml Monitor  platelets by anticoagulation protocol: Yes   Plan:   Continue Heparin infusion at 1000 units/hr  Daily heparin level, CBC  Leone Haven, PharmD  06/21/2014, 3:53 AM

## 2014-06-21 NOTE — Progress Notes (Signed)
Patient Name: [name] [name] Date of Encounter: 06/21/2014  Active Problems:   Total knee replacement status   Respiratory failure requiring intubation   Acute pulmonary edema   Acute respiratory failure with hypoxia   Hypoxemia   Arterial hypotension   Elevated troponin   Atrial fibrillation with RVR   Congestive dilated cardiomyopathy   Demand ischemia of myocardium   Length of Stay: 2  SUBJECTIVE  Denies angina and breathing is "not too bad". Taking O2 mask off to eat breakfast. AFib resolved overnight, returned a few minutes ago and is going "in and out". When in NSR HR in 80s, in AF 120-140. She had contralateral knee surgery last year without CHF or arrhythmia. Troponin peaking at <2. Echo suggestive of (but not 100% diagnostic of) stress cardiomyopathy. QT long on IV amio and hypokalemic. Great diuresis.  CURRENT MEDS . antiseptic oral rinse  7 mL Mouth Rinse QID  . apixaban  5 mg Oral BID  . aspirin  325 mg Oral Daily  . atorvastatin  40 mg Oral QHS  . chlorhexidine  15 mL Mouth Rinse BID  . fentaNYL   Intravenous 6 times per day  . fluticasone  2 spray Each Nare Daily  . insulin aspart  0-15 Units Subcutaneous TID WC  . insulin glargine  10 Units Subcutaneous Daily  . lisinopril  10 mg Oral Daily  . mometasone-formoterol  1 puff Inhalation BID  . montelukast  10 mg Oral QHS  . pantoprazole  40 mg Oral Daily  . PARoxetine  20 mg Oral QHS    OBJECTIVE   Intake/Output Summary (Last 24 hours) at 06/21/14 0832 Last data filed at 06/21/14 0600  Gross per 24 hour  Intake 700.15 ml  Output   2300 ml  Net -1599.85 ml   Filed Weights   06/19/14 1136 06/20/14 0500 06/21/14 0500  Weight: 178 lb (80.74 kg) 187 lb 13.3 oz (85.2 kg) 182 lb 15.7 oz (83 kg)    PHYSICAL EXAM Filed Vitals:   06/21/14 0700 06/21/14 0742 06/21/14 0800 06/21/14 0818  BP: 117/60     Pulse: 114     Temp:  98.3 F (36.8 C)    TempSrc:  Axillary    Resp: 18  22   Height:       Weight:      SpO2: 94%  93% 95%   General: Alert, oriented x3, no distress Head: no evidence of trauma, PERRL, EOMI, no exophtalmos or lid lag, no myxedema, no xanthelasma; normal ears, nose and oropharynx Neck: normal jugular venous pulsations and no hepatojugular reflux; brisk carotid pulses without delay and no carotid bruits Chest: clear to auscultation, no signs of consolidation by percussion or palpation, normal fremitus, symmetrical and full respiratory excursions Cardiovascular: normal position and quality of the apical impulse, regular rhythm, normal first and second heart sounds, no rubs or gallops, no murmur Abdomen: no tenderness or distention, no masses by palpation, no abnormal pulsatility or arterial bruits, normal bowel sounds, no hepatosplenomegaly Extremities: no clubbing, cyanosis or edema; 2+ radial, ulnar and brachial pulses bilaterally; 2+ right femoral, posterior tibial and dorsalis pedis pulses; 2+ left femoral, posterior tibial and dorsalis pedis pulses; no subclavian or femoral bruits. R knee surgical site looks great without bleeding or swelling Neurological: grossly nonfocal  LABS  CBC  Recent Labs  06/19/14 1629 06/20/14 0512 06/21/14 0215  WBC 18.2* 14.7* 10.5  NEUTROABS 12.2*  --   --   HGB 13.8 13.7 11.2*  HCT  42.0 40.8 32.0*  MCV 93.1 91.5 90.9  PLT 302 246 481   Basic Metabolic Panel  Recent Labs  06/19/14 1629 06/20/14 0512 06/21/14 0215 06/21/14 0500  NA 137 140 127* 132*  K 3.6* 3.2* 3.1* 3.2*  CL 97 96 87* 91*  CO2 22 22 28 26   GLUCOSE 448* 188* 208* 211*  BUN 18 15 12 11   CREATININE 0.92 0.82 0.67 0.61  CALCIUM 8.2* 8.4 7.5* 7.8*  MG 1.8 1.5  --   --   PHOS 6.7* 3.1  --   --    Liver Function Tests  Recent Labs  06/19/14 1629  AST 67*  ALT 73*  ALKPHOS 86  BILITOT 0.3  PROT 6.5  ALBUMIN 3.4*   No results for input(s): LIPASE, AMYLASE in the last 72 hours. Cardiac Enzymes  Recent Labs  06/19/14 1622  06/19/14 2311 06/20/14 0512  CKTOTAL 151  --   --   CKMB 3.3  --   --   TROPONINI 0.67* 1.30* 1.88*   BNP Invalid input(s): POCBNP D-Dimer No results for input(s): DDIMER in the last 72 hours. Hemoglobin A1C No results for input(s): HGBA1C in the last 72 hours. Fasting Lipid Panel No results for input(s): CHOL, HDL, LDLCALC, TRIG, CHOLHDL, LDLDIRECT in the last 72 hours. Thyroid Function Tests No results for input(s): TSH, T4TOTAL, T3FREE, THYROIDAB in the last 72 hours.  Invalid input(s): [place]  Radiology Studies Imaging results have been reviewed and Dg Chest Port 1 View  06/20/2014   CLINICAL DATA:  Intubated patient.  , pulmonary edema  EXAM: PORTABLE CHEST - 1 VIEW  COMPARISON:  Portable chest x-ray of June 19, 2014  FINDINGS: The lungs are well-expanded. The interstitial markings are less prominent. The pulmonary vascularity is more distinct today. There is no significant pleural effusion nor pneumothorax. The cardiopericardial silhouette is normal in size. The mediastinum is normal in width.  The endotracheal tube tip lies 3.5 cm above the crotch of the carina. The esophagogastric tube tip projects below the inferior margin of the image.  IMPRESSION: Improved appearance of the pulmonary interstitium consistent with resolving edema. There is no evidence of pneumonia nor significant pleural effusion. There has been interval withdrawal of the endotracheal tube such that its tip now lies 3.5 cm above the carina.   Electronically Signed   By: David  Martinique   On: 06/20/2014 07:45   Dg Chest Port 1 View  06/19/2014   CLINICAL DATA:  Endotracheal tube.  NG tube.  EXAM: PORTABLE CHEST - 1 VIEW  COMPARISON:  06/19/2014 at 1620 hr  FINDINGS: Endotracheal tube has been withdrawn since the prior study. The tip now projects approximately 5-10 mm above the carina. Enteric tube courses into the left upper abdomen with tip not imaged. The cardiomediastinal silhouette is unchanged. Diffuse  bilateral lung opacities, greatest in the perihilar regions and lung bases, do not appear significantly changed. No pleural effusion or pneumothorax is identified.  IMPRESSION: 1. Interval retraction of endotracheal tube as above. Recommend retracting an additional 2-3 cm. 2. Similar appearance bilateral pulmonary opacities consistent with edema.   Electronically Signed   By: Logan Bores   On: 06/19/2014 17:48   Dg Chest Port 1 View  06/19/2014   CLINICAL DATA:  Pulmonary edema.  EXAM: PORTABLE CHEST - 1 VIEW  COMPARISON:  May 07, 2014.  FINDINGS: Stable cardiomediastinal silhouette. Nasogastric tube is seen entering the stomach. Endotracheal tube is seen directed into the right mainstem bronchus ; is is  recommended to be withdrawn at least 4 cm. There is interval development of bilateral perihilar and basilar opacities concerning for pulmonary edema. No pneumothorax or significant pleural effusion is noted at this time.  IMPRESSION: Interval development of moderate bilateral perihilar and basilar pulmonary edema.  Nasogastric tube is seen directed in the right mainstem bronchus; it is recommended it be withdrawn at least 4 cm. Critical Value/emergent results were called by telephone at the time of interpretation on 06/19/2014 at 4:42 pm to [facility], LPN, who verbally acknowledged these results.   Electronically Signed   By: Sabino Dick M.D.   On: 06/19/2014 16:43    TELE NSR/AF w RVR  ECG Incomplete LBBB, long QT  ASSESSMENT AND PLAN  Suspected stress cardiomyopathy - Troponin increase is minor with disproportionate degree of LV dysfunction. Improving pulmonary edema. Paroxysmal, recurrent atrial fibrillation - increase amiodarone IV dose for better rate control and switch to PO amio in AM. No bleeding. Change IV heparin to Eliquis. Will continue Eliquis beyond the typical duration for post TKR DVT prophylaxis, for AF-related stroke prevention. Avoiding beta blocker due to COPD. Add  ACEi. Repeat echo early next week. If  LVEF fails to show improvement she should undergo repeat coronary angiography. Prolonged QT is secondary to a combination of hypokalemia (being replaced), stress CMP/ischemia and IV amiodarone   Sanda Klein, MD, [facility] [phone] office [phone] pager 06/21/2014 8:32 AM

## 2014-06-21 NOTE — Progress Notes (Signed)
PULMONARY / CRITICAL CARE MEDICINE   Name: [name] [name] MRN: [id number] DOB: 1950-08-25    ADMISSION DATE:  06/19/2014 CONSULTATION DATE:  06/19/2014  REFERRING MD :  Gladstone Lighter - Ortho  CHIEF COMPLAINT:  Respiratory failure  INITIAL PRESENTATION:  63 yo female former smoker s/p Rt knee arthroscopy 11/19.  Developed pulmonary edema with VDRF post-op.  STUDIES:  11/05/12 PFT >> FEV1 1.76 (91%), FEV1% 81, TLC 93%, DCLO 68% 06/20/14 Echo >> EF 15 to 20%  SIGNIFICANT EVENTS: 11/19 intubated to ICU 11/20 A fib with RVR, troponin elevation >> cardiology consulted  SUBJECTIVE:  Needed increased O2 overnight.  Denies chest pain.  Tolerating diet.  VITAL SIGNS: Temp:  [97.7 F (36.5 C)-98.4 F (36.9 C)] 98.3 F (36.8 C) (11/21 0742) Pulse Rate:  [44-128] 114 (11/21 0700) Resp:  [14-23] 18 (11/21 0700) BP: (79-120)/(38-76) 117/60 mmHg (11/21 0700) SpO2:  [71 %-100 %] 94 % (11/21 0700) FiO2 (%):  [40 %-60 %] 60 % (11/21 0400) Weight:  [182 lb 15.7 oz (83 kg)] 182 lb 15.7 oz (83 kg) (11/21 0500) INTAKE / OUTPUT:  Intake/Output Summary (Last 24 hours) at 06/21/14 0823 Last data filed at 06/21/14 0600  Gross per 24 hour  Intake 700.15 ml  Output   2300 ml  Net -1599.85 ml    PHYSICAL EXAMINATION: General: no distress Neuro: normal strength HEENT: no sinus tenderness Cardiovascular: irregular Lungs: basilar rales Abdomen:  Soft, non-distended Musculoskeletal: Rt knee dressing clean Skin: no rash  LABS:  CBC  Recent Labs Lab 06/19/14 1629 06/20/14 0512 06/21/14 0215  WBC 18.2* 14.7* 10.5  HGB 13.8 13.7 11.2*  HCT 42.0 40.8 32.0*  PLT 302 246 179   BMET  Recent Labs Lab 06/20/14 0512 06/21/14 0215 06/21/14 0500  NA 140 127* 132*  K 3.2* 3.1* 3.2*  CL 96 87* 91*  CO2 22 28 26   BUN 15 12 11   CREATININE 0.82 0.67 0.61  GLUCOSE 188* 208* 211*   Electrolytes  Recent Labs Lab 06/19/14 1629 06/20/14 0512 06/21/14 0215 06/21/14 0500  CALCIUM 8.2*  8.4 7.5* 7.8*  MG 1.8 1.5  --   --   PHOS 6.7* 3.1  --   --    ABG  Recent Labs Lab 06/19/14 1812 06/19/14 2010 06/20/14 0405  PHART 7.264* 7.340* 7.507*  PCO2ART 50.6* 42.0 31.0*  PO2ART 119.0* 100.0 191.0*   Liver Enzymes  Recent Labs Lab 06/19/14 1629  AST 67*  ALT 73*  ALKPHOS 86  BILITOT 0.3  ALBUMIN 3.4*   Cardiac Enzymes  Recent Labs Lab 06/19/14 1622 06/19/14 1629 06/19/14 2311 06/20/14 0512  TROPONINI 0.67*  --  1.30* 1.88*  PROBNP  --  108.0  --   --    Glucose  Recent Labs Lab 06/20/14 0434 06/20/14 0815 06/20/14 1141 06/20/14 1811 06/20/14 2112 06/21/14 0727  GLUCAP 168* 238* 213* 238* 233* 197*    Imaging Dg Chest Port 1 View  06/20/2014   CLINICAL DATA:  Intubated patient.  , pulmonary edema  EXAM: PORTABLE CHEST - 1 VIEW  COMPARISON:  Portable chest x-ray of June 19, 2014  FINDINGS: The lungs are well-expanded. The interstitial markings are less prominent. The pulmonary vascularity is more distinct today. There is no significant pleural effusion nor pneumothorax. The cardiopericardial silhouette is normal in size. The mediastinum is normal in width.  The endotracheal tube tip lies 3.5 cm above the crotch of the carina. The esophagogastric tube tip projects below the inferior margin of the image.  IMPRESSION: Improved appearance of the pulmonary interstitium consistent with resolving edema. There is no evidence of pneumonia nor significant pleural effusion. There has been interval withdrawal of the endotracheal tube such that its tip now lies 3.5 cm above the carina.   Electronically Signed   By: David  Martinique   On: 06/20/2014 07:45     ASSESSMENT / PLAN:  PULMONARY OETT 11/19 >> 11/20 A: Acute hypoxic respiratory failure 2nd to acute pulmonary edema >> extubated 11/20. Hx of chronic bronchitis, allergic rhinitis. P:   Oxygen prn to keep SaO2 > 92% F/u CXR 11/21 Continue dulera, singulair, flonase PRN albuterol Lasix 40 mg IV x  one on 11/21  CARDIOVASCULAR A:  A fib with RVR 11/20. Troponin elevation. Hx of CAD, HLD. Acute systolic heart failure ?Takotsubo cardiomyopathy. P:  Cardiology consulted 11/20 ASA, lipitor, lisinopril, eliquis Defer management of amiodarone gtt to cardiology  RENAL A:   Hypokalemia. P:   F/u and replace electrolytes as needed  GASTROINTESTINAL A:   H/o GERD, Barrett's esophagus. P:   Protonix for SUP Modified CHO diet  HEMATOLOGIC A:   Mild anemia. P:  Follow CBC intermittently  INFECTIOUS A:  No evidence for infection. P:   Monitor clinically  ENDOCRINE A:   DM type II. P:   CBG and SSI Resume metformin 11/21  NEUROLOGIC A:   Acute encephalopathy in setting of hypoxemia >> resolved. Hx of Depression, insomnia, anxiety. P:   Fentanyl PCA Resume paroxetine, alprazolam   ORTHOPEDICS A: S/p Rt knee arthroplasty. P: Per ortho Mobilize as tolerate when okay with ortho and cardiology  SUMMARY: Can change to SDU status.  Keep on PCCM service for now.  Discussed plan with Dr. Sallyanne Kuster.  Chesley Mires, MD [facility] Pulmonary/Critical Care 06/21/2014, 8:23 AM Pager:  [phone] After 3pm call: [phone]

## 2014-06-21 NOTE — Progress Notes (Signed)
[facility] ADULT ICU REPLACEMENT PROTOCOL FOR AM LAB REPLACEMENT ONLY  The patient does apply for the [facility] Adult ICU Electrolyte Replacment Protocol based on the criteria listed below:   1. Is GFR >/= 40 ml/min? Yes.    Patient's GFR today is >90 2. Is urine output >/= 0.5 ml/kg/hr for the last 6 hours? Yes.   Patient's UOP is 0.8 ml/kg/hr 3. Is BUN < 60 mg/dL? Yes.    Patient's BUN today is 12 4. Abnormal electrolyte(s): Potassium 5. Ordered repletion with: Potassium per protocol    Yusef Lamp P 06/21/2014 3:46 AM

## 2014-06-21 NOTE — Progress Notes (Signed)
I noticed in the cardiology note that he said the patient was extubated and then had to be reintubated because of respiratory failure.  This is not accurate.  The patient was only reintubated to be sure the endotracheal tube was not plugged or blocking the ventilations for some other obscure reason.  She was not intended to be extubated because the pulmonary edema developed before the surgery was even over.  We had already decided to keep her on a ventilator and admit her to ICU before we had the problem where we could not ventilate and she got hypoxic.    Phillips Goulette MD

## 2014-06-21 NOTE — Progress Notes (Signed)
OT Cancellation Note  Patient Details Name: [name] [name] MRN: [id number] DOB: 1951-01-08   Cancelled Treatment:    Reason Eval/Treat Not Completed: Medical issues which prohibited therapy - RN requests OT defer eval at this time.  Pt on 100% non re-breather sitting up in chair.  Will reattempt.   Darlina Rumpf Slaughter, OTR/L [phone]  06/21/2014, 1:30 PM

## 2014-06-21 NOTE — Addendum Note (Signed)
Addendum  created 06/21/14 0734 by Peyton Najjar, MD   Modules edited: Clinical Notes   Clinical Notes:  File: [id number]; Pend: [phone]

## 2014-06-21 NOTE — Progress Notes (Signed)
Patient's BP slowly getting softer. Spoke with MD Sood via phone and made aware most recent BP of 71/38 MAP 48. He advised to do nothing and see if she comes back up herself and that someone would be in the box at 3pm and he would make them aware. Will continue to monitor.

## 2014-06-21 NOTE — Progress Notes (Signed)
BP after NS 150/hr x 4 hour bolus was 81/26.  Pt is in an orthopneic position in bed on 5L [place] with sats 94% and is able to hold a conversation with her family.  Pt IVF has been limited and advised pt to decrease po fluids which she is reluctant to do. QTc is 0.65.   Also pt stated that she self- medicated with dexlansoprazole earlier in the day for her heartburn. She was instructed to send home medication with her family which she did. Dr Melvyn Novas in e-link was updated on patient status and said the blood pressure was acceptable and we would closely monitor her fluid status.  He also ordered med to replace her home med.  Will continue to monitor

## 2014-06-21 NOTE — Progress Notes (Signed)
Subjective: Good pain control right knee.     Objective: Vital signs in last 24 hours: Temp:  [97.7 F (36.5 C)-98.4 F (36.9 C)] 98.3 F (36.8 C) (11/21 0742) Pulse Rate:  [44-128] 107 (11/21 1000) Resp:  [14-27] 27 (11/21 1000) BP: (79-120)/(38-76) 109/51 mmHg (11/21 1011) SpO2:  [71 %-100 %] 95 % (11/21 1000) FiO2 (%):  [55 %-60 %] 60 % (11/21 0400) Weight:  [83 kg (182 lb 15.7 oz)] 83 kg (182 lb 15.7 oz) (11/21 0500)  Intake/Output from previous day: 11/20 0701 - 11/21 0700 In: 850.8 [I.V.:850.8] Out: 2300 [Urine:2300] Intake/Output this shift: Total I/O In: -  Out: 200 [Urine:200]   Recent Labs  06/19/14 1255 06/19/14 1256 06/19/14 1629 06/20/14 0512 06/21/14 0215  HGB 11.7* 11.6* 13.8 13.7 11.2*    Recent Labs  06/20/14 0512 06/21/14 0215  WBC 14.7* 10.5  RBC 4.46 3.52*  HCT 40.8 32.0*  PLT 246 179    Recent Labs  06/21/14 0215 06/21/14 0500  NA 127* 132*  K 3.1* 3.2*  CL 87* 91*  CO2 28 26  BUN 12 11  CREATININE 0.67 0.61  GLUCOSE 208* 211*  CALCIUM 7.5* 7.8*   No results for input(s): LABPT, INR in the last 72 hours.  Exam:  aquacel dressing intact.  Calf nontender, NVI.    Assessment/Plan: Continue present care.  Possible d/c home Monday if medically stable.    [name] [name] M 06/21/2014, 10:47 AM

## 2014-06-21 NOTE — Evaluation (Addendum)
Physical Therapy Evaluation Patient Details Name: [name] [name] MRN: [id number] DOB: Mar 16, 1951 Today's Date: 06/21/2014   History of Present Illness  63 yo female adm 06/19/14 for R TKA; Pt  developed pulmonary edema during surgery and so it was decided to leave pt on vent,  pt also with afib with RVR, elevated troponin (felt to be demand ishcemia per Cards), was  extubated 06/20/14; PMHx:  HTN, MI, COPD, L TKA, shoulder surgery;   Pt is POD #2 at time of this PT eval;, rate controlled and back in NSR  Clinical Impression  Pt will benefit from PT to address deficits below; Plan is for home with HHPT and family support, no DME needs; Will continue to follow    Follow Up Recommendations Home health PT    Equipment Recommendations  None recommended by PT    Recommendations for Other Services       Precautions / Restrictions Precautions Precautions: Fall Required Braces or Orthoses: Knee Immobilizer - Right Knee Immobilizer - Right: Discontinue once straight leg raise with < 10 degree lag Restrictions Weight Bearing Restrictions: No Other Position/Activity Restrictions: WBAT      Mobility  Bed Mobility Overal bed mobility: Needs Assistance;+ 2 for safety/equipment Bed Mobility: Supine to Sit     Supine to sit: Min assist     General bed mobility comments: assist with RL, incr time, cues for technique and safety  Transfers Overall transfer level: Needs assistance Equipment used: Rolling walker (2 wheeled) Transfers: Sit to/from [facility] Sit to Stand: +2 safety/equipment;Min assist Stand pivot transfers: +2 safety/equipment;Mod assist       General transfer comment: verbal cues for hand placement, RW safety with turns and posture  Ambulation/Gait Ambulation/Gait assistance: Min assist;+2 safety/equipment Ambulation Distance (Feet): 5 Feet (pivotal steps ) Assistive device: Rolling walker (2 wheeled) Gait Pattern/deviations: Step-to pattern      General Gait Details: cues to incr WBing on RLE, use of UEs for pain control  Stairs            Wheelchair Mobility    Modified Rankin (Stroke Patients Only)       Balance Overall balance assessment: Needs assistance           Standing balance-Leahy Scale: Poor Standing balance comment: needs UE support to maintain balance                             Pertinent Vitals/Pain Pain Assessment: 0-10 Pain Score: 8  Pain Location: right knee Pain Descriptors / Indicators: Sore Pain Intervention(s): Limited activity within patient's tolerance;Repositioned;Ice applied   HR 105--147--108 O2 sats on 100% NRB 97--95--96% RR 24--30--22     Home Living Family/patient expects to be discharged to:: Private residence Living Arrangements: Alone   Type of Home: House Home Access: Stairs to enter   [employer] of Steps: 2 Home Layout: One level Home Equipment: Environmental consultant - 2 wheels;Cane - single point;Bedside commode Additional Comments: pt will be staying with her mother and brother    Prior Function Level of Independence: Independent;Independent with assistive device(s)               Hand Dominance        Extremity/Trunk Assessment               Lower Extremity Assessment: RLE deficits/detail RLE Deficits / Details: hip flexion and knee extension 2+/5, limited by pain; ankle WFL;  Communication   Communication: No difficulties  Cognition Arousal/Alertness: Awake/alert Behavior During Therapy: WFL for tasks assessed/performed Overall Cognitive Status: Within Functional Limits for tasks assessed                      General Comments      Exercises Total Joint Exercises Ankle Circles/Pumps: AROM;Both;10 reps Quad Sets: 5 reps;AROM;Strengthening;Both Straight Leg Raises: AROM;AAROM;Strengthening;Right;5 reps Goniometric ROM: grossly -10 to 50* AAROM      Assessment/Plan    PT Assessment Patient needs  continued PT services  PT Diagnosis Difficulty walking   PT Problem List Decreased mobility;Decreased activity tolerance;Decreased range of motion;Decreased strength;Cardiopulmonary status limiting activity  PT Treatment Interventions DME instruction;Gait training;Functional mobility training;Therapeutic activities;Therapeutic exercise;Patient/family education;Stair training   PT Goals (Current goals can be found in the Care Plan section) Acute Rehab PT Goals Patient Stated Goal: home, walk with less pain PT Goal Formulation: With patient Time For Goal Achievement: 06/28/14 Potential to Achieve Goals: Good    Frequency 7X/week   Barriers to discharge        Co-evaluation               End of Session Equipment Utilized During Treatment: Gait belt Activity Tolerance: Patient tolerated treatment well Patient left: in chair;with call bell/phone within reach Nurse Communication: Mobility status         Time: 1010-1043 PT Time Calculation (min) (ACUTE ONLY): 33 min   Charges:   PT Evaluation $Initial PT Evaluation Tier I: 1 Procedure PT Treatments $Therapeutic Activity: 23-37 mins   PT G Codes:          Enes Rokosz 06/26/2014, 12:23 PM

## 2014-06-22 ENCOUNTER — Inpatient Hospital Stay (HOSPITAL_COMMUNITY): Payer: Medicaid Other | Admitting: Certified Registered Nurse Anesthetist

## 2014-06-22 DIAGNOSIS — I5181 Takotsubo syndrome: Secondary | ICD-10-CM

## 2014-06-22 LAB — BASIC METABOLIC PANEL
Anion gap: 13 (ref 5–15)
BUN: 15 mg/dL (ref 6–23)
CALCIUM: 7.3 mg/dL — AB (ref 8.4–10.5)
CO2: 24 mEq/L (ref 19–32)
Chloride: 91 mEq/L — ABNORMAL LOW (ref 96–112)
Creatinine, Ser: 0.71 mg/dL (ref 0.50–1.10)
GFR calc Af Amer: >90 mL/min (ref >90)
Glucose, Bld: 137 mg/dL — ABNORMAL HIGH (ref 70–99)
Potassium: 3.3 mEq/L — ABNORMAL LOW (ref 3.7–5.3)
SODIUM: 128 meq/L — AB (ref 137–147)

## 2014-06-22 LAB — CBC
HCT: 26.8 % — ABNORMAL LOW (ref 36.0–46.0)
Hemoglobin: 9.2 g/dL — ABNORMAL LOW (ref 12.0–15.0)
MCH: 31 pg (ref 26.0–34.0)
MCHC: 34.3 g/dL (ref 30.0–36.0)
MCV: 90.2 fL (ref 78.0–100.0)
Platelets: 130 10*3/uL — ABNORMAL LOW (ref 150–400)
RBC: 2.97 MIL/uL — AB (ref 3.87–5.11)
RDW: 13.4 % (ref 11.5–15.5)
WBC: 6.6 10*3/uL (ref 4.0–10.5)

## 2014-06-22 LAB — GLUCOSE, CAPILLARY
GLUCOSE-CAPILLARY: 165 mg/dL — AB (ref 70–99)
GLUCOSE-CAPILLARY: 146 mg/dL — AB (ref 70–99)
GLUCOSE-CAPILLARY: 210 mg/dL — AB (ref 70–99)
Glucose-Capillary: 237 mg/dL — ABNORMAL HIGH (ref 70–99)

## 2014-06-22 LAB — TSH: TSH: 3.12 u[IU]/mL (ref 0.350–4.500)

## 2014-06-22 LAB — CORTISOL: Cortisol, Plasma: 28.8 ug/dL

## 2014-06-22 LAB — MAGNESIUM: Magnesium: 1.4 mg/dL — ABNORMAL LOW (ref 1.5–2.5)

## 2014-06-22 MED ORDER — DIPHENHYDRAMINE HCL 50 MG/ML IJ SOLN: 12.5000 mg | Freq: Four times a day (QID) | INTRAMUSCULAR | Status: DC | PRN

## 2014-06-22 MED ORDER — AMIODARONE HCL 200 MG PO TABS
400.0000 mg | ORAL_TABLET | Freq: Every day | ORAL | Status: DC
  Administered 2014-06-22 – 2014-06-23 (×2): 400 mg via ORAL
  Filled 2014-06-22 (×3): qty 2

## 2014-06-22 MED ORDER — ALUM & MAG HYDROXIDE-SIMETH 200-200-20 MG/5ML PO SUSP
30.0000 mL | ORAL | Status: DC | PRN
  Administered 2014-06-22 – 2014-06-23 (×3): 30 mL via ORAL
  Filled 2014-06-22 (×3): qty 30

## 2014-06-22 MED ORDER — SODIUM CHLORIDE 0.9 % IJ SOLN: 9.0000 mL | INTRAMUSCULAR | Status: DC | PRN

## 2014-06-22 MED ORDER — NALOXONE HCL 0.4 MG/ML IJ SOLN: 0.4000 mg | INTRAMUSCULAR | Status: DC | PRN

## 2014-06-22 MED ORDER — DIPHENHYDRAMINE HCL 12.5 MG/5ML PO ELIX: 12.5000 mg | ORAL_SOLUTION | Freq: Four times a day (QID) | ORAL | Status: DC | PRN

## 2014-06-22 MED ORDER — POTASSIUM CHLORIDE CRYS ER 20 MEQ PO TBCR
40.0000 meq | EXTENDED_RELEASE_TABLET | Freq: Two times a day (BID) | ORAL | Status: AC
  Administered 2014-06-22 (×2): 40 meq via ORAL
  Filled 2014-06-22 (×2): qty 2

## 2014-06-22 MED ORDER — MAGNESIUM SULFATE 2 GM/50ML IV SOLN
2.0000 g | Freq: Once | INTRAVENOUS | Status: AC
  Administered 2014-06-22: 2 g via INTRAVENOUS
  Filled 2014-06-22: qty 50

## 2014-06-22 MED ORDER — ONDANSETRON HCL 4 MG/2ML IJ SOLN: 4.0000 mg | Freq: Four times a day (QID) | INTRAMUSCULAR | Status: DC | PRN

## 2014-06-22 MED ORDER — FENTANYL 10 MCG/ML IV SOLN
INTRAVENOUS | Status: DC
  Administered 2014-06-22: 30 ug via INTRAVENOUS
  Administered 2014-06-22: 19:00:00 via INTRAVENOUS
  Administered 2014-06-22: 165 ug via INTRAVENOUS
  Administered 2014-06-23: 45 ug via INTRAVENOUS
  Administered 2014-06-23: 75 ug via INTRAVENOUS
  Administered 2014-06-23: 45 ug via INTRAVENOUS
  Filled 2014-06-22 (×2): qty 50

## 2014-06-22 MED ORDER — SODIUM CHLORIDE 0.9 % IV SOLN
INTRAVENOUS | Status: DC | PRN
  Administered 2014-06-22: 06:00:00 via INTRA_ARTERIAL

## 2014-06-22 NOTE — Progress Notes (Signed)
[facility] notified that pt is c/o "heart-burn" . Retta Pitcher, Beverly Gust, RN

## 2014-06-22 NOTE — Progress Notes (Signed)
Subjective: 3 Days Post-Op Procedure(s) (LRB): RIGHT TOTAL KNEE ARTHROPLASTY (Right) Patient reports pain as well controlled.  Breathing is improving. Tolerating PO's. Denies CP or calf pain.  Objective: Vital signs in last 24 hours: Temp:  [97.9 F (36.6 C)-98.3 F (36.8 C)] 98.1 F (36.7 C) (11/22 0400) Pulse Rate:  [53-108] 68 (11/22 0700) Resp:  [16-29] 21 (11/22 0700) BP: (61-125)/(28-66) 94/37 mmHg (11/22 0000) SpO2:  [92 %-100 %] 95 % (11/22 0759) Arterial Line BP: (97-127)/(40-48) 120/42 mmHg (11/22 0700) FiO2 (%):  [100 %] 100 % (11/21 1557) Weight:  [84.1 kg (185 lb 6.5 oz)-86.4 kg (190 lb 7.6 oz)] 84.1 kg (185 lb 6.5 oz) (11/22 0500)  Intake/Output from previous day: 11/21 0701 - 11/22 0700 In: 2729.8 [P.O.:980; I.V.:1699.8; IV Piggyback:50] Out: 8325 [Urine:3235] Intake/Output this shift:     Recent Labs  06/19/14 1256 06/19/14 1629 06/20/14 0512 06/21/14 0215 06/22/14 0409  HGB 11.6* 13.8 13.7 11.2* 9.2*    Recent Labs  06/21/14 0215 06/22/14 0409  WBC 10.5 6.6  RBC 3.52* 2.97*  HCT 32.0* 26.8*  PLT 179 130*    Recent Labs  06/21/14 0500 06/22/14 0409  NA 132* 128*  K 3.2* 3.3*  CL 91* 91*  CO2 26 24  BUN 11 15  CREATININE 0.61 0.71  GLUCOSE 211* 137*  CALCIUM 7.8* 7.3*   No results for input(s): LABPT, INR in the last 72 hours.  Well nourished. Alert and oriented x3. RRR, Lungs clear, BS x4. Abdomen soft and non tender. Right Calf soft and non tender. Right knee dressing C/D/I. No DVT signs. Compartment soft. No signs of infection.  Right LE neurovascular intact.  Assessment/Plan: 3 Days Post-Op Procedure(s) (LRB): RIGHT TOTAL KNEE ARTHROPLASTY (Right) Plan to D/c first of the week if stable Right knee doing well Up with PT Continue current care  [name] [name] L 06/22/2014, 8:30 AM

## 2014-06-22 NOTE — Progress Notes (Signed)
Patient Name: [name] [name] Date of Encounter: 06/22/2014  Active Problems:   Total knee replacement status   Respiratory failure requiring intubation   Acute pulmonary edema   Acute respiratory failure with hypoxia   Hypoxemia   Arterial hypotension   Elevated troponin   Atrial fibrillation with RVR   Congestive dilated cardiomyopathy   Demand ischemia of myocardium   Takotsubo syndrome   Length of Stay: 3  SUBJECTIVE  Feels better than yesterday. Knee pain is major complaint. Off oxygen by face mask, on oximizer. BP substantially improved.  CURRENT MEDS . amiodarone  400 mg Oral Daily  . antiseptic oral rinse  7 mL Mouth Rinse QID  . apixaban  5 mg Oral BID  . aspirin  325 mg Oral Daily  . atorvastatin  40 mg Oral QHS  . chlorhexidine  15 mL Mouth Rinse BID  . famotidine (PEPCID) IV  20 mg Intravenous Q24H  . fentaNYL   Intravenous 6 times per day  . fluticasone  2 spray Each Nare Daily  . insulin aspart  0-15 Units Subcutaneous TID WC  . insulin glargine  10 Units Subcutaneous Daily  . metFORMIN  500 mg Oral Q breakfast  . mometasone-formoterol  1 puff Inhalation BID  . montelukast  10 mg Oral QHS  . pantoprazole  40 mg Oral Daily  . PARoxetine  20 mg Oral QHS  . potassium chloride  40 mEq Oral BID    OBJECTIVE   Intake/Output Summary (Last 24 hours) at 06/22/14 0802 Last data filed at 06/22/14 0600  Gross per 24 hour  Intake 2709.82 ml  Output   3035 ml  Net -325.18 ml   Filed Weights   06/21/14 0500 06/22/14 0400 06/22/14 0500  Weight: 182 lb 15.7 oz (83 kg) 190 lb 7.6 oz (86.4 kg) 185 lb 6.5 oz (84.1 kg)    PHYSICAL EXAM Filed Vitals:   06/22/14 0500 06/22/14 0600 06/22/14 0700 06/22/14 0759  BP:      Pulse: 90 67 68   Temp:      TempSrc:      Resp: 17 22 21    Height:      Weight: 185 lb 6.5 oz (84.1 kg)     SpO2: 94% 98% 95% 95%   General: Alert, oriented x3, no distress Head: no evidence of trauma, PERRL, EOMI, no exophtalmos or lid  lag, no myxedema, no xanthelasma; normal ears, nose and oropharynx Neck: normal jugular venous pulsations and no hepatojugular reflux; brisk carotid pulses without delay and no carotid bruits Chest: clear to auscultation, no signs of consolidation by percussion or palpation, normal fremitus, symmetrical and full respiratory excursions Cardiovascular: normal position and quality of the apical impulse, regular rhythm, normal first and second heart sounds, no rubs or gallops, no murmur Abdomen: no tenderness or distention, no masses by palpation, no abnormal pulsatility or arterial bruits, normal bowel sounds, no hepatosplenomegaly Extremities: no clubbing, cyanosis or edema; 2+ radial, ulnar and brachial pulses bilaterally; 2+ right femoral, posterior tibial and dorsalis pedis pulses; 2+ left femoral, posterior tibial and dorsalis pedis pulses; no subclavian or femoral bruits. R knee surgical site looks great without bleeding or swelling Neurological: grossly nonfocal  LABS  CBC  Recent Labs  06/19/14 1629  06/21/14 0215 06/22/14 0409  WBC 18.2*  < > 10.5 6.6  NEUTROABS 12.2*  --   --   --   HGB 13.8  < > 11.2* 9.2*  HCT 42.0  < > 32.0* 26.8*  MCV 93.1  < > 90.9 90.2  PLT 302  < > 179 130*  < > = values in this interval not displayed. Basic Metabolic Panel  Recent Labs  06/19/14 1629 06/20/14 0512  06/21/14 0500 06/22/14 0409  NA 137 140  < > 132* 128*  K 3.6* 3.2*  < > 3.2* 3.3*  CL 97 96  < > 91* 91*  CO2 22 22  < > 26 24  GLUCOSE 448* 188*  < > 211* 137*  BUN 18 15  < > 11 15  CREATININE 0.92 0.82  < > 0.61 0.71  CALCIUM 8.2* 8.4  < > 7.8* 7.3*  MG 1.8 1.5  --   --  1.4*  PHOS 6.7* 3.1  --   --   --   < > = values in this interval not displayed. Liver Function Tests  Recent Labs  06/19/14 1629  AST 67*  ALT 73*  ALKPHOS 86  BILITOT 0.3  PROT 6.5  ALBUMIN 3.4*   No results for input(s): LIPASE, AMYLASE in the last 72 hours. Cardiac Enzymes  Recent Labs   06/19/14 1622 06/19/14 2311 06/20/14 0512  CKTOTAL 151  --   --   CKMB 3.3  --   --   TROPONINI 0.67* 1.30* 1.88*    Radiology Studies Imaging results have been reviewed and Dg Chest Port 1 View  06/21/2014   CLINICAL DATA:  Pulmonary edema.  COPD.  EXAM: PORTABLE CHEST - 1 VIEW  COMPARISON:  06/20/2014  FINDINGS: Interval extubation and removal of NG tube. Heart is upper limits normal in size. Continued interstitial prominence throughout the lungs, presumably interstitial edema, not significantly changed. No confluent opacities or effusions. No acute bony abnormality.  IMPRESSION: Continued stable interstitial prominence throughout the lungs.   Electronically Signed   By: Rolm Baptise M.D.   On: 06/21/2014 11:09    TELE NSR in 60s, no AFib since yesterday  ECG Repeat pending. On Tele QTc around 630 ms  ASSESSMENT AND PLAN Clinically improved AFib resolved. Very long QT. K still low and now also hyponatremic.  Switch to PO amiodarone - may have to stop altogether if QT is still severely prolonged after correcting K. Whether she has either Takotsubo syndrome or coronary ischemia, these also will contribute to QT prolongation. BP improved, but will refrain from restarting ACEi until her Na is better. Repeat echo in AM - if it does not evolve in a pattern consistent with stress cardiomyopathy, will recommend coronary angiography.    Sanda Klein, MD, [facility] [phone] office [phone] pager 06/22/2014 8:02 AM

## 2014-06-22 NOTE — Progress Notes (Signed)
PULMONARY / CRITICAL CARE MEDICINE   Name: [name] [name] MRN: [id number] DOB: Aug 22, 1950    ADMISSION DATE:  06/19/2014 CONSULTATION DATE:  06/19/2014  REFERRING MD :  Gladstone Lighter - Ortho  CHIEF COMPLAINT:  Respiratory failure  INITIAL PRESENTATION:  63 yo female former smoker s/p Rt knee arthroscopy 11/19.  Developed pulmonary edema with VDRF post-op.  STUDIES:  11/05/12 PFT >> FEV1 1.76 (91%), FEV1% 81, TLC 93%, DCLO 68% 06/20/14 Echo >> EF 15 to 20%  SIGNIFICANT EVENTS: 11/19 intubated to ICU 11/20 A fib with RVR, troponin elevation >> cardiology consulted 11/21 Converted to sinus rhythm  SUBJECTIVE:  Low BP yesterday on cuff >> aline inserted and BP readings better/more accurate.  Breathing better.  Denies chest pain.  Rt knee pain better.  VITAL SIGNS: Temp:  [97.9 F (36.6 C)-98.3 F (36.8 C)] 98.1 F (36.7 C) (11/22 0800) Pulse Rate:  [53-108] 67 (11/22 0800) Resp:  [16-29] 19 (11/22 0800) BP: (61-125)/(28-66) 100/38 mmHg (11/22 0800) SpO2:  [92 %-100 %] 95 % (11/22 0800) Arterial Line BP: (97-127)/(40-48) 112/43 mmHg (11/22 0800) FiO2 (%):  [100 %] 100 % (11/21 1557) Weight:  [185 lb 6.5 oz (84.1 kg)-190 lb 7.6 oz (86.4 kg)] 185 lb 6.5 oz (84.1 kg) (11/22 0500) INTAKE / OUTPUT:  Intake/Output Summary (Last 24 hours) at 06/22/14 0845 Last data filed at 06/22/14 0600  Gross per 24 hour  Intake 2666.16 ml  Output   3035 ml  Net -368.84 ml    PHYSICAL EXAMINATION: General: no distress Neuro: normal strength HEENT: no sinus tenderness Cardiovascular: regular Lungs: no wheeze Abdomen:  Soft, non-distended Musculoskeletal: Rt knee dressing clean Skin: no rash  LABS:  CBC  Recent Labs Lab 06/20/14 0512 06/21/14 0215 06/22/14 0409  WBC 14.7* 10.5 6.6  HGB 13.7 11.2* 9.2*  HCT 40.8 32.0* 26.8*  PLT 246 179 130*   BMET  Recent Labs Lab 06/21/14 0215 06/21/14 0500 06/22/14 0409  NA 127* 132* 128*  K 3.1* 3.2* 3.3*  CL 87* 91* 91*  CO2 28  26 24   BUN 12 11 15   CREATININE 0.67 0.61 0.71  GLUCOSE 208* 211* 137*   Electrolytes  Recent Labs Lab 06/19/14 1629 06/20/14 0512 06/21/14 0215 06/21/14 0500 06/22/14 0409  CALCIUM 8.2* 8.4 7.5* 7.8* 7.3*  MG 1.8 1.5  --   --  1.4*  PHOS 6.7* 3.1  --   --   --    ABG  Recent Labs Lab 06/19/14 1812 06/19/14 2010 06/20/14 0405  PHART 7.264* 7.340* 7.507*  PCO2ART 50.6* 42.0 31.0*  PO2ART 119.0* 100.0 191.0*   Liver Enzymes  Recent Labs Lab 06/19/14 1629  AST 67*  ALT 73*  ALKPHOS 86  BILITOT 0.3  ALBUMIN 3.4*   Cardiac Enzymes  Recent Labs Lab 06/19/14 1622 06/19/14 1629 06/19/14 2311 06/20/14 0512  TROPONINI 0.67*  --  1.30* 1.88*  PROBNP  --  108.0  --   --    Glucose  Recent Labs Lab 06/21/14 0727 06/21/14 1102 06/21/14 1715 06/21/14 2147 06/21/14 2315 06/22/14 0807  GLUCAP 197* 213* 141* 185* 156* 165*    Imaging Dg Chest Port 1 View  06/21/2014   CLINICAL DATA:  Pulmonary edema.  COPD.  EXAM: PORTABLE CHEST - 1 VIEW  COMPARISON:  06/20/2014  FINDINGS: Interval extubation and removal of NG tube. Heart is upper limits normal in size. Continued interstitial prominence throughout the lungs, presumably interstitial edema, not significantly changed. No confluent opacities or effusions. No acute bony  abnormality.  IMPRESSION: Continued stable interstitial prominence throughout the lungs.   Electronically Signed   By: Rolm Baptise M.D.   On: 06/21/2014 11:09     ASSESSMENT / PLAN:  PULMONARY OETT 11/19 >> 11/20 A: Acute hypoxic respiratory failure 2nd to acute pulmonary edema >> extubated 11/20. Hx of chronic bronchitis, allergic rhinitis. P:   Oxygen prn to keep SaO2 > 92% F/u CXR intermittently Continue dulera, singulair, flonase PRN albuterol Goal even fluid balance  CARDIOVASCULAR A:  A fib with RVR 11/20. Troponin elevation. Hx of CAD, HLD. Acute systolic heart failure ?Takotsubo cardiomyopathy. Prolonged QT. P:  ASA,  lipitor, lisinopril, eliquis Transition to po amiodarone per cardiology F/u Echo 11/23 Might need cardiac cath >> defer timing to cardiology  RENAL A:   Hypokalemia. Hypomagnesemia. Hyponatremia. P:   F/u and replace electrolytes as needed Check TSH, cortisol  GASTROINTESTINAL A:   H/o GERD, Barrett's esophagus. P:   Protonix for SUP Modified CHO diet  HEMATOLOGIC A:   Mild anemia, thrombocytopenia. P:  Follow CBC  INFECTIOUS A:  No evidence for infection. P:   Monitor clinically  ENDOCRINE A:   DM type II. P:   CBG and SSI Resumed metformin 11/21  NEUROLOGIC A:   Acute encephalopathy in setting of hypoxemia >> resolved. Hx of Depression, insomnia, anxiety. P:   Fentanyl PCA Resume paroxetine, alprazolam   ORTHOPEDICS A: S/p Rt knee arthroplasty. P: Per ortho Mobilize as tolerate when okay with ortho and cardiology  SUMMARY: Respiratory status and hemodynamics improved.  Will ask [facility] to assume care from 11/23 and PCCM sign off.  Chesley Mires, MD [facility] Pulmonary/Critical Care 06/22/2014, 8:45 AM Pager:  [phone] After 3pm call: [phone]

## 2014-06-22 NOTE — Progress Notes (Signed)
On call cardiology notified that pt is back in atrial fib(rate 90s).  [name] [name]

## 2014-06-22 NOTE — Progress Notes (Signed)
Physical Therapy Treatment Patient Details Name: [name] [name] MRN: [id number] DOB: 1951-04-14 Today's Date: 06/22/2014    History of Present Illness 63 yo female adm 06/19/14 for R TKA; Pt  developed pulmonary edema during surgery and so it was decided to leave pt on vent,  pt also with afib with RVR, elevated troponin (felt to be demand ishcemia per Cards), was  extubated 06/20/14; PMHx:  HTN, MI, COPD, L TKA, shoulder surgery;   Pt is POD #2 at time of this PT eval;, rate controlled and back in NSR    PT Comments    Pt progressing well, off NRB and on [place] today; she continues to fatigue quickly but much better overall today, able to incr amb distance; pt reported feeling anxious at times d/t nearby pt screaming quite loudly.   Follow Up Recommendations  Supervision - Intermittent;Home health PT     Equipment Recommendations  None recommended by PT    Recommendations for Other Services       Precautions / Restrictions Precautions Required Braces or Orthoses: Knee Immobilizer - Right Knee Immobilizer - Right: Discontinue once straight leg raise with < 10 degree lag Restrictions Weight Bearing Restrictions: No Other Position/Activity Restrictions: WBAT    Mobility  Bed Mobility Overal bed mobility: Needs Assistance;+ 2 for safety/equipment Bed Mobility: Supine to Sit     Supine to sit: Min guard     General bed mobility comments: incr time, pt motivated to self assist; min/guard for trunk-safety, +2 for multiple lines  Transfers Overall transfer level: Needs assistance Equipment used: Rolling walker (2 wheeled) Transfers: Sit to/from Stand Sit to Stand: +2 safety/equipment;Min assist         General transfer comment: verbal cues for hand placement, RW safety   Ambulation/Gait Ambulation/Gait assistance: Min assist Ambulation Distance (Feet): 26 Feet Assistive device: Rolling walker (2 wheeled) Gait Pattern/deviations: Step-to pattern;Decreased weight shift  to right;Decreased stance time - right     General Gait Details: cues to incr WBing on RLE, use of UEs for pain control, sequence   Stairs            Wheelchair Mobility    Modified Rankin (Stroke Patients Only)       Balance Overall balance assessment: Needs assistance   Sitting balance-Leahy Scale: Poor       Standing balance-Leahy Scale: Poor Standing balance comment: needs UE support to maintain balance                    Cognition Arousal/Alertness: Awake/alert Behavior During Therapy: WFL for tasks assessed/performed Overall Cognitive Status: Within Functional Limits for tasks assessed       Memory: Decreased short-term memory (pt does not recall having PT yesterday)              Exercises Total Joint Exercises Ankle Circles/Pumps: AROM;Both;10 reps Quad Sets: AROM;Strengthening;Both;10 reps Heel Slides: AAROM;Right;10 reps Hip ABduction/ADduction: AAROM;Strengthening;Right;10 reps Straight Leg Raises: AROM;AAROM;Strengthening;Right;10 reps Goniometric ROM: grossly -8 to 60* with AAROM    General Comments        Pertinent Vitals/Pain Pain Assessment: 0-10 Pain Score: 8  Pain Location: right knee Pain Descriptors / Indicators: Sore Pain Intervention(s): Limited activity within patient's tolerance;Monitored during session;PCA encouraged;Ice applied  BP 112/54 sats 95% on [place] HR 81 RR25 Stable during session today    Home Living                      Prior Function  PT Goals (current goals can now be found in the care plan section) Acute Rehab PT Goals Patient Stated Goal: home, walk with less pain PT Goal Formulation: With patient Time For Goal Achievement: 06/28/14 Potential to Achieve Goals: Good Progress towards PT goals: Progressing toward goals    Frequency  7X/week    PT Plan Current plan remains appropriate    Co-evaluation             End of Session Equipment Utilized During  Treatment: Gait belt Activity Tolerance: Patient tolerated treatment well Patient left: in chair;with call bell/phone within reach     Time: 0930-1009 PT Time Calculation (min) (ACUTE ONLY): 39 min  Charges:  $Gait Training: 23-37 mins $Therapeutic Exercise: 8-22 mins                    G Codes:      Takiera Mayo Jul 15, 2014, 10:23 AM

## 2014-06-23 ENCOUNTER — Encounter: Payer: Medicaid Other | Admitting: Internal Medicine

## 2014-06-23 DIAGNOSIS — I369 Nonrheumatic tricuspid valve disorder, unspecified: Secondary | ICD-10-CM

## 2014-06-23 DIAGNOSIS — I5021 Acute systolic (congestive) heart failure: Secondary | ICD-10-CM | POA: Diagnosis present

## 2014-06-23 LAB — BASIC METABOLIC PANEL
ANION GAP: 11 (ref 5–15)
BUN: 8 mg/dL (ref 6–23)
CALCIUM: 8.6 mg/dL (ref 8.4–10.5)
CO2: 25 mEq/L (ref 19–32)
Chloride: 97 mEq/L (ref 96–112)
Creatinine, Ser: 0.61 mg/dL (ref 0.50–1.10)
GFR calc Af Amer: >90 mL/min (ref >90)
Glucose, Bld: 161 mg/dL — ABNORMAL HIGH (ref 70–99)
Potassium: 4.5 mEq/L (ref 3.7–5.3)
SODIUM: 133 meq/L — AB (ref 137–147)

## 2014-06-23 LAB — CBC
HCT: 25.8 % — ABNORMAL LOW (ref 36.0–46.0)
Hemoglobin: 8.9 g/dL — ABNORMAL LOW (ref 12.0–15.0)
MCH: 31.1 pg (ref 26.0–34.0)
MCHC: 34.5 g/dL (ref 30.0–36.0)
MCV: 90.2 fL (ref 78.0–100.0)
Platelets: 177 10*3/uL (ref 150–400)
RBC: 2.86 MIL/uL — ABNORMAL LOW (ref 3.87–5.11)
RDW: 13.7 % (ref 11.5–15.5)
WBC: 5.5 10*3/uL (ref 4.0–10.5)

## 2014-06-23 LAB — GLUCOSE, CAPILLARY
GLUCOSE-CAPILLARY: 178 mg/dL — AB (ref 70–99)
GLUCOSE-CAPILLARY: 160 mg/dL — AB (ref 70–99)
Glucose-Capillary: 149 mg/dL — ABNORMAL HIGH (ref 70–99)
Glucose-Capillary: 131 mg/dL — ABNORMAL HIGH (ref 70–99)

## 2014-06-23 LAB — MAGNESIUM: MAGNESIUM: 2.1 mg/dL (ref 1.5–2.5)

## 2014-06-23 MED ORDER — ASPIRIN EC 81 MG PO TBEC
81.0000 mg | DELAYED_RELEASE_TABLET | Freq: Every day | ORAL | Status: DC
  Administered 2014-06-23 – 2014-06-24 (×2): 81 mg via ORAL
  Filled 2014-06-23 (×2): qty 1

## 2014-06-23 MED ORDER — HYDROMORPHONE HCL 1 MG/ML IJ SOLN
0.5000 mg | INTRAMUSCULAR | Status: DC | PRN
  Administered 2014-06-23 – 2014-06-24 (×4): 0.5 mg via INTRAVENOUS
  Filled 2014-06-23 (×4): qty 1

## 2014-06-23 MED ORDER — DOCUSATE SODIUM 100 MG PO CAPS
100.0000 mg | ORAL_CAPSULE | Freq: Two times a day (BID) | ORAL | Status: DC
  Administered 2014-06-23 – 2014-06-24 (×3): 100 mg via ORAL
  Filled 2014-06-23 (×3): qty 1

## 2014-06-23 MED ORDER — POLYETHYLENE GLYCOL 3350 17 G PO PACK
17.0000 g | PACK | Freq: Every day | ORAL | Status: DC
  Administered 2014-06-23 – 2014-06-24 (×2): 17 g via ORAL
  Filled 2014-06-23 (×2): qty 1

## 2014-06-23 MED ORDER — OXYCODONE HCL 5 MG PO TABS
5.0000 mg | ORAL_TABLET | ORAL | Status: DC | PRN
  Administered 2014-06-23 – 2014-06-24 (×6): 5 mg via ORAL
  Filled 2014-06-23 (×6): qty 1

## 2014-06-23 NOTE — Progress Notes (Signed)
Wasted in sink 34 cc of fentanyl PCA. Witnessed by Prince Rome RN Krissie Merrick, Beverly Gust, RN

## 2014-06-23 NOTE — Progress Notes (Signed)
PT Cancellation Note  Patient Details Name: [name] [name] MRN: [id number] DOB: [birth year]   Cancelled Treatment:     PM Tx cancelled per RN.  Pt c/o increased knee pain/redness during OT session.   Rica Koyanagi  PTA [facility]  Acute  Rehab Pager      [phone]

## 2014-06-23 NOTE — Progress Notes (Signed)
CARE MANAGEMENT NOTE 06/23/2014  Patient:  CATHERN, TAHIR   Account Number:  [id number]  Date Initiated:  06/19/2014  Documentation initiated by:  DAVIS,RHONDA  Subjective/Objective Assessment:   post op complication requiring reintubation in pacu     Action/Plan:   tbd   Anticipated DC Date:  06/25/2014   Anticipated DC Plan:  IP REHAB FACILITY  In-house referral  NA      DC Planning Services  CM consult      PAC Choice  NA   Choice offered to / List presented to:  NA   DME arranged  NA      DME agency  NA     [place] arranged  NA      [facility] agency  NA   Status of service:  In process, will continue to follow Medicare Important Message given?   (If response is "NO", the following Medicare IM given date fields will be blank) Date Medicare IM given:   Medicare IM given by:   Date Additional Medicare IM given:   Additional Medicare IM given by:    Discharge Disposition:    Per UR Regulation:  Reviewed for med. necessity/level of care/duration of stay  If discussed at [place] of Stay Meetings, dates discussed:    Comments:  06/23/14 Daijanae Rafalski RN,BSN NCM [place].POD#5 R TKA,CHF,AFIB.PT-CIR.CIR CONS PENDING-AWAIT RECOMMENDATIONS.  [id number]/BULAGT Rosana Hoes, RN, BSN, CCM Chart reviewed. Discharge needs and patient's stay to be reviewed and followed by case manager. Chart note for progression of stay: RIGHT TOTAL KNEE ARTHROPLASTY (Right) Patient developed pulmonary edema at the end of her surgery and we gave lasix 20 mg and decided to admit to ICU.  While waiting to move her to ICU became very difficult to ventilate.  SaO2 in 60's % during this time.  Gave IV increments of epinephrine, inhaled anesthetic, and albuterol, but eventually impossible to ventilate.  Reintubated and looked with bronchoscope, but everything was normal.  Changed filter in circuit and immediately could ventilate with no problem.  SaO2 very briefly as low as 30% but mostly  55-65% during the period it took to figure out what the problem was.  Patient was stable when we took her to ICU with SaO2 above 90% and normal BP.  Have ordered cardiac enzymes and CXR.  Called ICU physician with history.   [place] MD

## 2014-06-23 NOTE — Progress Notes (Addendum)
Patient Name: PAULLETTE MCKAIN Date of Encounter: 06/23/2014  Active Problems:   Total knee replacement status   Respiratory failure requiring intubation   Acute pulmonary edema   Acute respiratory failure with hypoxia   Hypoxemia   Arterial hypotension   Elevated troponin   Atrial fibrillation with RVR   Congestive dilated cardiomyopathy   Demand ischemia of myocardium   Takotsubo syndrome   Length of Stay: 4  SUBJECTIVE  Feels well. Brief (approx 1 hour) PAF last evening, VR around 105, now NSR in 60s.  BP substantially improved despite net negative fluid balance, K and Mg now normal. Marked reduction in QTC now around 470 ms.  CURRENT MEDS . amiodarone  400 mg Oral Daily  . antiseptic oral rinse  7 mL Mouth Rinse QID  . apixaban  5 mg Oral BID  . aspirin EC  81 mg Oral Daily  . atorvastatin  40 mg Oral QHS  . chlorhexidine  15 mL Mouth Rinse BID  . docusate sodium  100 mg Oral BID  . fluticasone  2 spray Each Nare Daily  . insulin aspart  0-15 Units Subcutaneous TID WC  . insulin glargine  10 Units Subcutaneous Daily  . mometasone-formoterol  1 puff Inhalation BID  . montelukast  10 mg Oral QHS  . pantoprazole  40 mg Oral Daily  . PARoxetine  20 mg Oral QHS  . polyethylene glycol  17 g Oral Daily    OBJECTIVE   Intake/Output Summary (Last 24 hours) at 06/23/14 0910 Last data filed at 06/23/14 0600  Gross per 24 hour  Intake 1346.7 ml  Output   3450 ml  Net -2103.3 ml   Filed Weights   06/21/14 0500 06/22/14 0400 06/23/14 0500  Weight: 182 lb 15.7 oz (83 kg) 190 lb 7.6 oz (86.4 kg) 188 lb 15 oz (85.7 kg)    PHYSICAL EXAM Filed Vitals:   06/22/14 2300 06/23/14 0000 06/23/14 0400 06/23/14 0500  BP:      Pulse: 68 71 65 66  Temp:  98.1 F (36.7 C)  97.8 F (36.6 C)  TempSrc:  Oral  Oral  Resp: 23 18 16 16   Height:      Weight:    188 lb 15 oz (85.7 kg)  SpO2: 98% 98% 98% 100%   General: Alert, oriented x3, no distress Head: no evidence of  trauma, PERRL, EOMI, no exophtalmos or lid lag, no myxedema, no xanthelasma; normal ears, nose and oropharynx Neck: normal jugular venous pulsations and no hepatojugular reflux; brisk carotid pulses without delay and no carotid bruits Chest: clear to auscultation, no signs of consolidation by percussion or palpation, normal fremitus, symmetrical and full respiratory excursions Cardiovascular: normal position and quality of the apical impulse, regular rhythm, normal first and second heart sounds, no rubs or gallops, no murmur Abdomen: no tenderness or distention, no masses by palpation, no abnormal pulsatility or arterial bruits, normal bowel sounds, no hepatosplenomegaly Extremities: no clubbing, cyanosis or edema; 2+ radial, ulnar and brachial pulses bilaterally; 2+ right femoral, posterior tibial and dorsalis pedis pulses; 2+ left femoral, posterior tibial and dorsalis pedis pulses; no subclavian or femoral bruits. R knee surgical site looks great without bleeding or swelling Neurological: grossly nonfocal  LABS  CBC  Recent Labs  06/22/14 0409 06/23/14 0430  WBC 6.6 5.5  HGB 9.2* 8.9*  HCT 26.8* 25.8*  MCV 90.2 90.2  PLT 130* 614   Basic Metabolic Panel  Recent Labs  06/22/14 0409 06/23/14 0430  NA 128* 133*  K 3.3* 4.5  CL 91* 97  CO2 24 25  GLUCOSE 137* 161*  BUN 15 8  CREATININE 0.71 0.61  CALCIUM 7.3* 8.6  MG 1.4* 2.1   Recent Labs  06/22/14 0939  TSH 3.120    Radiology Studies Imaging results have been reviewed and Dg Chest Port 1 View  06/21/2014   CLINICAL DATA:  Pulmonary edema.  COPD.  EXAM: PORTABLE CHEST - 1 VIEW  COMPARISON:  06/20/2014  FINDINGS: Interval extubation and removal of NG tube. Heart is upper limits normal in size. Continued interstitial prominence throughout the lungs, presumably interstitial edema, not significantly changed. No confluent opacities or effusions. No acute bony abnormality.  IMPRESSION: Continued stable interstitial  prominence throughout the lungs.   Electronically Signed   By: Rolm Baptise M.D.   On: 06/21/2014 11:09    TELE PAF 2200h last night, otw NSR   ASSESSMENT AND PLAN Continues to improve. Low arrhythmia burden. Improved electrolytes. QT much better.  Continue PO amiodarone. Reduce to 200 mg in a week. Plan to stop in 30 days if no arrhythmia and myocardial dysfunction resolves. Continue Eliquis for several months until we are certain arrhythmia was exclusively related to the acute illness. If echo today shows improving LV function, plan Lexiscan Myoview (normal coronaries by cath 2006). If LV still severely depressed will hold Eliquis and plan cath in AM.  I think we can DC arterial line and transfer out of ICU from my point of view.  Sanda Klein, MD, [facility] [phone] office [phone] pager 06/23/2014 9:10 AM

## 2014-06-23 NOTE — Progress Notes (Signed)
CARE MANAGEMENT NOTE 06/23/2014  Patient:  MENDE, BISWELL   Account Number:  [id number]  Date Initiated:  06/19/2014  Documentation initiated by:  DAVIS,RHONDA  Subjective/Objective Assessment:   post op complication requiring reintubation in pacu     Action/Plan:   tbd   Anticipated DC Date:  06/25/2014   Anticipated DC Plan:  IP REHAB FACILITY  In-house referral  NA      DC Planning Services  CM consult      PAC Choice  NA   Choice offered to / List presented to:  NA   DME arranged  NA      DME agency  NA     [place] arranged  NA      [place] agency  NA   Status of service:  In process, will continue to follow Medicare Important Message given?   (If response is "NO", the following Medicare IM given date fields will be blank) Date Medicare IM given:   Medicare IM given by:   Date Additional Medicare IM given:   Additional Medicare IM given by:    Discharge Disposition:    Per UR Regulation:  Reviewed for med. necessity/level of care/duration of stay  If discussed at [place] of Stay Meetings, dates discussed:    Comments:  06/23/14 Marrion Finan RN,BSN NCM [place].POD#4 R TKA,CHF,AFIB.PT-CIR.CIR CONS PENDING-AWAIT RECOMMENDATIONS.  [id number] Rosana Hoes, RN, BSN, CCM Chart reviewed. Discharge needs and patient's stay to be reviewed and followed by case manager. Chart note for progression of stay: RIGHT TOTAL KNEE ARTHROPLASTY (Right) Patient developed pulmonary edema at the end of her surgery and we gave lasix 20 mg and decided to admit to ICU.  While waiting to move her to ICU became very difficult to ventilate.  SaO2 in 60's % during this time.  Gave IV increments of epinephrine, inhaled anesthetic, and albuterol, but eventually impossible to ventilate.  Reintubated and looked with bronchoscope, but everything was normal.  Changed filter in circuit and immediately could ventilate with no problem.  SaO2 very briefly as low as 30% but mostly  55-65% during the period it took to figure out what the problem was.  Patient was stable when we took her to ICU with SaO2 above 90% and normal BP.  Have ordered cardiac enzymes and CXR.  Called ICU physician with history.   [place] MD

## 2014-06-23 NOTE — Progress Notes (Signed)
Echocardiogram 2D Echocardiogram has been performed.  [name] [name] 06/23/2014, 10:52 AM

## 2014-06-23 NOTE — Progress Notes (Signed)
Subjective: 4 Days Post-Op Procedure(s) (LRB): RIGHT TOTAL KNEE ARTHROPLASTY (Right) Patient reports pain as 2 on 0-10 scale. Doing much better. She is alert and knows where she is and who I am. She will be DCd to her [facility] home when she is cleared by her MD.She is on Eliquis because of her Atrial Fibrillation.   Objective: Vital signs in last 24 hours: Temp:  [97.5 F (36.4 C)-98.4 F (36.9 C)] 97.8 F (36.6 C) (11/23 0500) Pulse Rate:  [63-96] 66 (11/23 0500) Resp:  [16-25] 16 (11/23 0500) BP: (100)/(38) 100/38 mmHg (11/22 0800) SpO2:  [88 %-100 %] 100 % (11/23 0500) Arterial Line BP: (110-148)/(40-61) 142/52 mmHg (11/23 0500) Weight:  [85.7 kg (188 lb 15 oz)] 85.7 kg (188 lb 15 oz) (11/23 0500)  Intake/Output from previous day: 11/22 0701 - 11/23 0700 In: 1420.1 [P.O.:1080; I.V.:340.1] Out: 4125 [Urine:4125] Intake/Output this shift:     Recent Labs  06/21/14 0215 06/22/14 0409 06/23/14 0430  HGB 11.2* 9.2* 8.9*    Recent Labs  06/22/14 0409 06/23/14 0430  WBC 6.6 5.5  RBC 2.97* 2.86*  HCT 26.8* 25.8*  PLT 130* 177    Recent Labs  06/22/14 0409 06/23/14 0430  NA 128* 133*  K 3.3* 4.5  CL 91* 97  CO2 24 25  BUN 15 8  CREATININE 0.71 0.61  GLUCOSE 137* 161*  CALCIUM 7.3* 8.6   No results for input(s): LABPT, INR in the last 72 hours.  Dorsiflexion/Plantar flexion intact No cellulitis present  Assessment/Plan: 4 Days Post-Op Procedure(s) (LRB): RIGHT TOTAL KNEE ARTHROPLASTY (Right) Up with therapy  Koy Lamp A 06/23/2014, 7:05 AM

## 2014-06-23 NOTE — Consult Note (Signed)
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 Referring Physician:  Dr. [place] Pink   HPI: [name] [name] is a 63 y.o. 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 Post op course complicated by  VDRF due to flash pulmonary edema as well as A fib with RVR. She was started on IV amiodarone and converted into NSR. 2D echo with EF 15-20 5 with diffuse hypokinesis.  She was extubated on 11/20 and Dr. Ron Parker consulted for input and felt that edema was multifactorial and elevated troponin due to demand ischemia from acute pulmonary edema and A fib and felt that echo diagnostic of stress cardiomyopathy. She had recurrent A fib on 11/20 and was started on eliquis. Repeat Echo today with improvement in EF 55% and normal LV thickness/function. Therapy ongoing and patient continues to be limited by poor DOE as well as pain. CIR recommended for follow up therapy.    Review of Systems  HENT: Negative for hearing loss.   Eyes: Negative for blurred vision and double vision.  Respiratory: Positive for shortness of breath (with activity). Negative for wheezing.   Cardiovascular: Negative for chest pain and palpitations.  Gastrointestinal: Positive for constipation (No BM since surgery). Negative for heartburn and nausea.  Genitourinary: Negative for urgency and frequency.  Musculoskeletal: Positive for myalgias and falls (for past 3 months).  Neurological: Positive for sensory change (RLE numbness intermittently with give  away). Negative for dizziness and headaches.  Psychiatric/Behavioral: The patient does not have insomnia.       Past Medical History  Diagnosis Date  . Insomnia, unspecified   . Esophageal reflux   . Hyperlipemia   . Asthma   . Osteoporosis   . Arthritis   . COPD (chronic obstructive pulmonary disease)   .  Depression   . Personal history of colonic polyps 09/2010    TUBULAR ADENOMAS (X3); NEGATIVE FOR HIGH GRADE DYSPLASIA OR MALIGNANCY.  . Barrett esophagus   . Hiatal hernia   . Esophageal stricture   . GERD (gastroesophageal reflux disease)   . GAD (generalized anxiety disorder)   . Aortic valve disorders   . Heart murmur   . Anxiety   . Shortness of breath     with exertion   . Pneumonia     hx of   . Type II or unspecified type diabetes mellitus without mention of complication, not stated as uncontrolled     on no meds   . Anemia     hx of years ago   . Hypertension 12/07/2012  . Myocardial infarction 01-08-13    '06-Chest pain-no stent-dx. MI-stress related  . History of kidney stones 01-08-13    past hx.  . Transfusion history 01-08-13    past hx. many yrs ago  . Dysrhythmia     heart skips per pt   . 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 Urinary frequency     AND INCONTINENCE    Past Surgical History  Procedure Laterality Date  . Cholecystectomy  2005  . Partial hysterectomy    . Shoulder surgery  2011  . Tubal ligation    . Tonsillectomy    . Foot surgery  2005    Left foot  . Wrist flexion tendon tenotomies and proximal  corpectomy w/ wrist arthrodesis&iliac crest bone graft    . Cardiac catheterization      normal per Dr Acie Fredrickson in note dated 02/06/12   . Knee arthroscopy  02/28/2012    Procedure: ARTHROSCOPY KNEE;  Surgeon: Tobi Bastos, MD;  Location: [facility];  Service: Orthopedics;  Laterality: Left;  . Esophageal dilation  01-08-13    2 yrs ago  . Cataract extraction, bilateral    . Total knee arthroplasty Left 01/17/2013    Procedure: LEFT TOTAL KNEE ARTHROPLASTY;  Surgeon: Tobi Bastos, MD;  Location: [facility];  Service: Orthopedics;  Laterality: Left;  . Total knee arthroplasty Right 06/19/2014    Procedure: RIGHT TOTAL KNEE ARTHROPLASTY;  Surgeon: Tobi Bastos, MD;  Location: [facility];  Service: Orthopedics;  Laterality: Right;     Family History  Problem Relation Age of Onset  . Breast cancer Mother   . Diabetes Maternal Grandfather   . Kidney disease      Both sides of family  . Heart disease      Both sides of family  . Colon cancer Brother 59  . Colon polyps Brother   . Breast cancer Maternal Aunt   . Breast cancer Maternal Grandmother     Social History:  Lives alone. Disabled since '85 due to stroke? Independent with AD. She reports that she quit smoking 2 months ago.  Her smoking use included Cigarettes. She has a 10 pack-year smoking history. She quit smokeless tobacco use about 2 years ago. Her smokeless tobacco use included Snuff and Chew. She reports that she drinks about 0.6 oz of alcohol per week. She reports that she does not use illicit drugs.     Allergies  Allergen Reactions  . Other Anaphylaxis    BEE STINGS- CLOSE OFF THROAT  . Penicillins Anaphylaxis    "CLOSES OFF MY BREATHING"  . Pneumococcal Vaccines Other (See Comments)    PP-23 vaccine; had BIG local red reaction with heat.   Jaclyn Prime [Ibandronate Sodium]     Jaw popping  . Ceclor [Cefaclor] Other (See Comments)    Reaction=burning all over  . Codeine Nausea And Vomiting  . Cortisone Hives    All over body    Medications Prior to Admission  Medication Sig Dispense Refill  . albuterol (PROVENTIL HFA;VENTOLIN HFA) 108 (90 BASE) MCG/ACT inhaler Inhale 2 puffs into the lungs every 4 (four) hours as needed for wheezing or shortness of breath. Take two puffs four times a day as needed 1 Inhaler prn  . albuterol (PROVENTIL) (2.5 MG/3ML) 0.083% nebulizer solution Take 3 mLs (2.5 mg total) by nebulization every 6 (six) hours as needed for wheezing. DX  491.9 150 mL 12  . ALPRAZolam (XANAX) 0.5 MG tablet Take 0.5 mg by mouth 3 (three) times daily.    [name] aspirin EC 81 MG tablet Take 81 mg by mouth every morning.     [name] atorvastatin (LIPITOR) 40 MG tablet Take 1 tablet (40 mg total) by mouth at bedtime. 30 tablet 0  . benzonatate  (TESSALON) 200 MG capsule Take 1 capsule (200 mg total) by mouth 3 (three) times daily as needed for cough. 30 capsule 2  . calcium carbonate (OS-CAL) 600 MG TABS Take 600 mg by mouth at bedtime.     . Cholecalciferol (VITAMIN D) 1000 UNITS capsule Take 1,000 Units by mouth at bedtime.     [name] dexlansoprazole (DEXILANT) 60 MG capsule Take 1 capsule (60 mg total) by mouth every morning. Taylor  capsule 5  . docusate sodium (COLACE) 100 MG capsule Take 100 mg by mouth 2 (two) times daily.    . ferrous sulfate 325 (65 FE) MG tablet Take 325 mg by mouth 2 (two) times daily.     . fluticasone (FLONASE) 50 MCG/ACT nasal spray Place 2 sprays into the nose daily. (Patient taking differently: Place 1 spray into the nose 2 (two) times daily. ) 16 g prn  . furosemide (LASIX) 40 MG tablet Take 40 mg by mouth every morning.    . Menthol (HONEY LEMON COUGH DROPS MT) Use as directed in the mouth or throat. Taking approx. 8-9 daily    . metFORMIN (GLUCOPHAGE) 500 MG tablet Take 1 tablet (500 mg total) by mouth daily. Takes at bedtime. (Patient taking differently: Take 500 mg by mouth 2 (two) times daily. ) 30 tablet 0  . mometasone-formoterol (DULERA) 100-5 MCG/ACT AERO 2 puffs then rinse mouth twice daily maintenance controller (Patient taking differently: Inhale 1 puff into the lungs 2 (two) times daily. ) 1 Inhaler prn  . montelukast (SINGULAIR) 10 MG tablet Take 1 tablet (10 mg total) by mouth at bedtime. 30 tablet 11  . PARoxetine (PAXIL) 20 MG tablet Take 20 mg by mouth at bedtime.     . vitamin B-12 (CYANOCOBALAMIN) 1000 MCG tablet Take 1,000 mcg by mouth at bedtime.     . CHANTIX STARTING MONTH PAK 0.5 MG X 11 & 1 MG X 42 tablet TAKE AS DIRECTED 53 tablet 0  . EPINEPHrine 0.3 mg/0.3 mL IJ SOAJ injection Inject 0.3 mLs (0.3 mg total) into the muscle once. (Patient taking differently: Inject 0.3 mg into the muscle once as needed. EPI PEN - PT ALLERGIC TO BEES) 1 Device 1    Home: Home Living Family/patient  expects to be discharged to:: Private residence Living Arrangements: Alone Type of Home: House Home Access: Stairs to enter Technical brewer of Steps: 2 Home Layout: One level Home Equipment: Environmental consultant - 2 wheels, Cane - single point, Bedside commode Additional Comments: pt will be staying with her mother and brother  Functional History: Prior Function Level of Independence: Independent, Independent with assistive device(s) Functional Status:  Mobility: Bed Mobility Overal bed mobility: Needs Assistance Bed Mobility: Supine to Sit Supine to sit: Min guard General bed mobility comments: incr time, pt motivated to self assist; min/guard for safety, impulsive Transfers Overall transfer level: Needs assistance Equipment used: Rolling walker (2 wheeled) Transfers: Sit to/from Stand Sit to Stand: Min assist, From elevated surface Stand pivot transfers: Min assist General transfer comment: verbal cues for hand placement, RW safety Ambulation/Gait Ambulation/Gait assistance: Mod assist Ambulation Distance (Feet): 20 Feet (then 10') Assistive device: Rolling walker (2 wheeled) Gait Pattern/deviations: Step-to pattern, Decreased step length - right, Decreased stance time - right, Antalgic General Gait Details: cues to incr WBing on RLE, use of UEs for pain control, sequence    ADL: ADL Overall ADL's : Needs assistance/impaired Eating/Feeding: Set up, Sitting Grooming: Sitting, Wash/dry face Upper Body Bathing: Sitting, Set up Lower Body Bathing: Sit to/from stand, Moderate assistance Upper Body Dressing : Set up, Sitting Lower Body Dressing: Sit to/from stand, Maximal assistance Toilet Transfer: Moderate assistance, RW Toileting- Clothing Manipulation and Hygiene: Moderate assistance, Sit to/from stand General ADL Comments: Pt very agreeable - but in pain. RN called as knee was very red. RN marked area around knee that was reddened  Cognition: Cognition Overall Cognitive  Status: Within Functional Limits for tasks assessed Orientation Level: Oriented X4 Cognition Arousal/Alertness:  Awake/alert Behavior During Therapy: WFL for tasks assessed/performed Overall Cognitive Status: Within Functional Limits for tasks assessed Memory: Decreased short-term memory (pt does not recall having PT yesterday)  Blood pressure 142/63, pulse 70, temperature 97.8 F (36.6 C), temperature source Oral, resp. rate 20, height 5' 0.5" (1.537 m), weight 85.7 kg (188 lb 15 oz), SpO2 98 %. Physical Exam  Nursing note and vitals reviewed. Constitutional: She is oriented to person, place, and time. She appears well-developed and well-nourished.  HENT:  Head: Atraumatic.  Eyes: Conjunctivae are normal. Pupils are equal, round, and reactive to light.  Neck: Normal range of motion. Neck supple.  Cardiovascular: Normal rate and regular rhythm.   Respiratory: Effort normal and breath sounds normal. No respiratory distress. She has no wheezes.  GI: Soft. Bowel sounds are normal. She exhibits no distension. There is no tenderness.  Musculoskeletal:  Right knee with surgical dressing in place--hot to touch, tender, edematous and has diffuse erythema surrounding medial aspect. LLE with well healed old TKR incision.   Neurological: She is alert and oriented to person, place, and time.  Skin: Skin is warm and dry.    Results for orders placed or performed during the hospital encounter of 06/19/14 (from the past 24 hour(s))  Glucose, capillary     Status: Abnormal   Collection Time: 06/22/14  4:32 PM  Result Value Ref Range   Glucose-Capillary 146 (H) 70 - 99 mg/dL  Glucose, capillary     Status: Abnormal   Collection Time: 06/22/14  9:48 PM  Result Value Ref Range   Glucose-Capillary 210 (H) 70 - 99 mg/dL  Basic metabolic panel     Status: Abnormal   Collection Time: 06/23/14  4:30 AM  Result Value Ref Range   Sodium 133 (L) 137 - 147 mEq/L   Potassium 4.5 3.7 - 5.3 mEq/L   Chloride  97 96 - 112 mEq/L   CO2 25 19 - 32 mEq/L   Glucose, Bld 161 (H) 70 - 99 mg/dL   BUN 8 6 - 23 mg/dL   Creatinine, Ser 0.61 0.50 - 1.10 mg/dL   Calcium 8.6 8.4 - 10.5 mg/dL   GFR calc non Af Amer >90 >90 mL/min   GFR calc Af Amer >90 >90 mL/min   Anion gap 11 5 - 15  Magnesium     Status: None   Collection Time: 06/23/14  4:30 AM  Result Value Ref Range   Magnesium 2.1 1.5 - 2.5 mg/dL  CBC     Status: Abnormal   Collection Time: 06/23/14  4:30 AM  Result Value Ref Range   WBC 5.5 4.0 - 10.5 K/uL   RBC 2.86 (L) 3.87 - 5.11 MIL/uL   Hemoglobin 8.9 (L) 12.0 - 15.0 g/dL   HCT 25.8 (L) 36.0 - 46.0 %   MCV 90.2 78.0 - 100.0 fL   MCH 31.1 26.0 - 34.0 pg   MCHC 34.5 30.0 - 36.0 g/dL   RDW 13.7 11.5 - 15.5 %   Platelets 177 150 - 400 K/uL  Glucose, capillary     Status: Abnormal   Collection Time: 06/23/14  8:11 AM  Result Value Ref Range   Glucose-Capillary 149 (H) 70 - 99 mg/dL  Glucose, capillary     Status: Abnormal   Collection Time: 06/23/14 12:52 PM  Result Value Ref Range   Glucose-Capillary 178 (H) 70 - 99 mg/dL   Comment 1 Notify RN    Comment 2 Documented in Chart    No results  found.  Assessment/Plan: Diagnosis: right tka with post op respiratory failure/cardiac complications 1. Does the need for close, 24 hr/day medical supervision in concert with the patient's rehab needs make it unreasonable for this patient to be served in a less intensive setting? Yes 2. Co-Morbidities requiring supervision/potential complications: afib, dm2, cm 3. Due to bladder management, bowel management, safety, skin/wound care, disease management, medication administration, pain management and patient education, does the patient require 24 hr/day rehab nursing? Yes 4. Does the patient require coordinated care of a physician, rehab nurse, PT (1-2 hrs/day, 5 days/week) and OT (1-2 hrs/day, 5 days/week) to address physical and functional deficits in the context of the above medical diagnosis(es)?  Yes Addressing deficits in the following areas: balance, endurance, locomotion, strength, transferring, bowel/bladder control, bathing, dressing, feeding, grooming, toileting and psychosocial support 5. Can the patient actively participate in an intensive therapy program of at least 3 hrs of therapy per day at least 5 days per week? Yes 6. The potential for patient to make measurable gains while on inpatient rehab is excellent 7. Anticipated functional outcomes upon discharge from inpatient rehab are modified independent  with PT, modified independent with OT, n/a with SLP. 8. Estimated rehab length of stay to reach the above functional goals is: 10-14 days 9. Does the patient have adequate social supports and living environment to accommodate these discharge functional goals? Yes 10. Anticipated D/C setting: Home 11. Anticipated post D/C treatments: HH therapy and Outpatient therapy 12. Overall Rehab/Functional Prognosis: excellent  RECOMMENDATIONS: This patient's condition is appropriate for continued rehabilitative care in the following setting: CIR Patient has agreed to participate in recommended program. Yes and Potentially Note that insurance prior authorization may be required for reimbursement for recommended care.  Comment: Rehab Admissions Coordinator to follow up.  Thanks,  [name], MD, [facility]     06/23/2014

## 2014-06-23 NOTE — Progress Notes (Signed)
Rehab Admissions Coordinator Note:  Patient was screened by Retta Diones for appropriateness for an Inpatient Acute Rehab Consult.  At this time, we are recommending Inpatient Rehab consult.  Retta Diones 06/23/2014, 1:29 PM  I can be reached at [phone].

## 2014-06-23 NOTE — Progress Notes (Signed)
Pt's right knee is reddened and the redness seems to have spread since her arrival on the floor at 1245. Redness marked for future assessments. Knee is also warm to the touch and swollen. Surgical dressing remains intact with no drainage. Dr Rushie Nyhan made aware of this, no new orders received at this time and he stated that he will see pt in the am. Will continue to monitor.

## 2014-06-23 NOTE — Progress Notes (Signed)
Physical Therapy Treatment Patient Details Name: [name] [name] MRN: [id number] DOB: 04/13/1951 Today's Date: 06/23/2014    History of Present Illness 63 yo female adm 06/19/14 for R TKA; Pt  developed pulmonary edema during surgery and so it was decided to leave pt on vent,  pt also with afib with RVR, elevated troponin (felt to be demand ishcemia per Cards), was  extubated 06/20/14; PMHx:  HTN, MI, COPD, L TKA, shoulder surgery;   Pt is POD #2 at time of this PT eval;, rate controlled and back in NSR    PT Comments    Patient is very limited in mobility due to  DOE, . Patient could benefit from CIR consult. Patient reports plans to go to [facility] home.  Follow Up Recommendations  CIR (pt may have limited resources for post op  )     Equipment Recommendations  None recommended by PT    Recommendations for Other Services       Precautions / Restrictions Precautions Precautions: Fall Precaution Comments: monitor HR/Sats Required Braces or Orthoses: Knee Immobilizer - Right-did not use. Knee Immobilizer - Right: Discontinue once straight leg raise with < 10 degree lag Restrictions Weight Bearing Restrictions: No Other Position/Activity Restrictions: WBAT    Mobility  Bed Mobility Overal bed mobility: Needs Assistance;+ 2 for safety/equipment Bed Mobility: Supine to Sit     Supine to sit: Min guard     General bed mobility comments: incr time, pt motivated to self assist; min/guard for safety, impulsive  Transfers Overall transfer level: Needs assistance Equipment used: Rolling walker (2 wheeled) Transfers: Sit to/from [employer] Sit to Stand: Min assist;+2 safety/equipment;From elevated surface Stand pivot transfers: Min assist;+2 safety/equipment       General transfer comment: verbal cues for hand placement, RW safety , did not turn and back up to surface completely  Ambulation/Gait Ambulation/Gait assistance: mod assist;+2  safety/equipment Ambulation Distance (Feet): 20 Feet (then 10')   Gait Pattern/deviations: Step-to pattern;Decreased step length - right;Decreased stance time - right;Antalgic     General Gait Details: cues to incr WBing on RLE, use of UEs for pain control, sequence   Stairs            Wheelchair Mobility    Modified Rankin (Stroke Patients Only)       Balance                                    Cognition Arousal/Alertness: Awake/alert Behavior During Therapy: WFL for tasks assessed/performed Overall Cognitive Status: Within Functional Limits for tasks assessed                      Exercises Total Joint Exercises Ankle Circles/Pumps: AROM;Both;10 reps Quad Sets: AROM;Strengthening;Both;10 reps Short Arc QuadSinclair Ship;Right;10 reps;Seated Hip ABduction/ADduction: AAROM;Strengthening;Right;10 reps Straight Leg Raises: AROM;AAROM;Strengthening;Right;10 reps Goniometric ROM: 5-60    General Comments        Pertinent Vitals/Pain Pain Score: 10-Worst pain ever Pain Location: R knee Pain Descriptors / Indicators: Aching;Discomfort;Tightness Pain Intervention(s): Limited activity within patient's tolerance;Patient requesting pain meds-RN notified    Home Living                      Prior Function            PT Goals (current goals can now be found in the care plan section) Progress towards PT goals: Progressing toward goals  Frequency  7X/week    PT Plan Discharge plan needs to be updated;Current plan remains appropriate (discussed with patient  that snf rehab may benefit patient but she stated" I will re[facility]")    Co-evaluation             End of Session   Activity Tolerance: Patient limited by fatigue;Patient limited by pain Patient left: in chair;with nursing/sitter in room     Time: 1158-1230 PT Time Calculation (min) (ACUTE ONLY): 32 min  Charges:  $Gait Training: 8-22 mins                    G  Codes:      Claretha Cooper 06/23/2014, 12:54 PM Tresa Endo PT [phone]

## 2014-06-23 NOTE — Progress Notes (Signed)
[facility] PROGRESS NOTE  [name] [name] HFW:[id number] DOB: 11/11/1950 DOA: 06/19/2014  PCP: Velna Hatchet, MD  Brief HPI: Patient is a 63 year old Caucasian female who underwent right knee arthroplasty on November 19. Postoperatively, she developed pulmonary edema. She had to be intubated. She was given Lasix. She was in the ICU. Subsequently, she improved. She was extubated and was transferred to the hospitalist care. Echocardiogram revealed EF of 15%. Cardiology was consulted.  Past medical history:  Past Medical History  Diagnosis Date  . Insomnia, unspecified   . Esophageal reflux   . Hyperlipemia   . Asthma   . Osteoporosis   . Arthritis   . COPD (chronic obstructive pulmonary disease)   . Depression   . Personal history of colonic polyps 09/2010    TUBULAR ADENOMAS (X3); NEGATIVE FOR HIGH GRADE DYSPLASIA OR MALIGNANCY.  . Barrett esophagus   . Hiatal hernia   . Esophageal stricture   . GERD (gastroesophageal reflux disease)   . GAD (generalized anxiety disorder)   . Aortic valve disorders   . Heart murmur   . Anxiety   . Shortness of breath     with exertion   . Pneumonia     hx of   . Type II or unspecified type diabetes mellitus without mention of complication, not stated as uncontrolled     on no meds   . Anemia     hx of years ago   . Hypertension 12/07/2012  . Myocardial infarction 01-08-13    '06-Chest pain-no stent-dx. MI-stress related  . History of kidney stones 01-08-13    past hx.  . Transfusion history 01-08-13    past hx. many yrs ago  . Dysrhythmia     heart skips per pt   . 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 Urinary frequency     AND INCONTINENCE    Consultants: Cardiology  Procedures:  2D ECHO Study Conclusions - Left ventricle: The cavity size was normal. Wall thickness wasnormal. Systolic function was severely reduced. The estimatedejection fraction was in the range of 15% to 20%.  Diffusehypokinesis with mild sparing of the basilar wall segments. Noevidence of thrombus.  SIGNIFICANT EVENTS: 11/19 intubated to ICU 11/20 A fib with RVR, troponin elevation >> cardiology consulted 11/21 Converted to sinus rhythm  Antibiotics: None  Subjective: Patient feels better this morning. Denies any chest pain or shortness of breath. Still has pain in the right knee, especially with movement.  Objective: Vital Signs  Filed Vitals:   06/23/14 0400 06/23/14 0500 06/23/14 0800 06/23/14 1000  BP:      Pulse: 65 66 63 71  Temp:  97.8 F (36.6 C)    TempSrc:  Oral    Resp: 16 16 15 20   Height:      Weight:  85.7 kg (188 lb 15 oz)    SpO2: 98% 100% 100% 99%    Intake/Output Summary (Last 24 hours) at 06/23/14 1101 Last data filed at 06/23/14 1000  Gross per 24 hour  Intake   1810 ml  Output   2950 ml  Net  -1140 ml   Filed Weights   06/21/14 0500 06/22/14 0400 06/23/14 0500  Weight: 83 kg (182 lb 15.7 oz) 86.4 kg (190 lb 7.6 oz) 85.7 kg (188 lb 15 oz)    General appearance: alert, cooperative, appears stated age, no distress and moderately obese Resp: clear to auscultation bilaterally Cardio: regular rate and rhythm,  S1, S2 normal, no murmur, click, rub or gallop GI: soft, non-tender; bowel sounds normal; no masses,  no organomegaly Extremities: Right knee is swollen. Limited range of motion of that joint. No pitting edema. Neurologic: Alert and oriented 3. No cranial nerve deficits. Motor strength is equal bilateral upper and left lower extremity. Limited in the right lower extremity due to recent surgery.  Lab Results:  Basic Metabolic Panel:  Recent Labs Lab 06/19/14 1629 06/20/14 0512 06/21/14 0215 06/21/14 0500 06/22/14 0409 06/23/14 0430  NA 137 140 127* 132* 128* 133*  K 3.6* 3.2* 3.1* 3.2* 3.3* 4.5  CL 97 96 87* 91* 91* 97  CO2 22 22 28 26 24 25   GLUCOSE 448* 188* 208* 211* 137* 161*  BUN 18 15 12 11 15 8   CREATININE 0.92 0.82 0.67 0.61  0.71 0.61  CALCIUM 8.2* 8.4 7.5* 7.8* 7.3* 8.6  MG 1.8 1.5  --   --  1.4* 2.1  PHOS 6.7* 3.1  --   --   --   --    Liver Function Tests:  Recent Labs Lab 06/19/14 1629  AST 67*  ALT 73*  ALKPHOS 86  BILITOT 0.3  PROT 6.5  ALBUMIN 3.4*   CBC:  Recent Labs Lab 06/19/14 1629 06/20/14 0512 06/21/14 0215 06/22/14 0409 06/23/14 0430  WBC 18.2* 14.7* 10.5 6.6 5.5  NEUTROABS 12.2*  --   --   --   --   HGB 13.8 13.7 11.2* 9.2* 8.9*  HCT 42.0 40.8 32.0* 26.8* 25.8*  MCV 93.1 91.5 90.9 90.2 90.2  PLT 302 246 179 130* 177   Cardiac Enzymes:  Recent Labs Lab 06/19/14 1622 06/19/14 2311 06/20/14 0512  CKTOTAL 151  --   --   CKMB 3.3  --   --   TROPONINI 0.67* 1.30* 1.88*   BNP (last 3 results)  Recent Labs  10/26/13 0128 06/19/14 1629  PROBNP 41.0 108.0   CBG:  Recent Labs Lab 06/21/14 2315 06/22/14 0807 06/22/14 1223 06/22/14 1632 06/22/14 2148  GLUCAP 156* 165* 237* 146* 210*     Studies/Results: No results found.  Medications:  Scheduled: . amiodarone  400 mg Oral Daily  . antiseptic oral rinse  7 mL Mouth Rinse QID  . apixaban  5 mg Oral BID  . aspirin EC  81 mg Oral Daily  . atorvastatin  40 mg Oral QHS  . chlorhexidine  15 mL Mouth Rinse BID  . docusate sodium  100 mg Oral BID  . fluticasone  2 spray Each Nare Daily  . insulin aspart  0-15 Units Subcutaneous TID WC  . insulin glargine  10 Units Subcutaneous Daily  . mometasone-formoterol  1 puff Inhalation BID  . montelukast  10 mg Oral QHS  . pantoprazole  40 mg Oral Daily  . PARoxetine  20 mg Oral QHS  . polyethylene glycol  17 g Oral Daily   Continuous:  CHE:NIDPOE chloride, acetaminophen **OR** [DISCONTINUED] acetaminophen, albuterol, ALPRAZolam, alum & mag hydroxide-simeth, diphenhydrAMINE **OR** [DISCONTINUED] diphenhydrAMINE, HYDROmorphone (DILAUDID) injection, naloxone **AND** [DISCONTINUED] sodium chloride, oxyCODONE, phenol  Assessment/Plan:  Active Problems:   Total knee  replacement status   Respiratory failure requiring intubation   Acute pulmonary edema   Acute respiratory failure with hypoxia   Hypoxemia   Arterial hypotension   Elevated troponin   Atrial fibrillation with RVR   Congestive dilated cardiomyopathy   Demand ischemia of myocardium   Takotsubo syndrome    Acute hypoxic respiratory failure 2nd to acute pulmonary  edema  She is improved. She was extubated November 20. She is not on any diuretics at this time. We will discuss with cardiology.  Hx of chronic bronchitis, allergic rhinitis. Stable currently. Continue with her Dulera, Singulair, Flonase.  A fib with RVR 11/20. Currently in sinus rhythm. She is on amiodarone orally. Cardiology is following closely. She is noted to have prolonged QT on EKG.  Acute systolic congestive heart failure/?Takotsubo cardiomyopathy/Troponin elevation/Hx of CAD, HLD. Plan is to repeat echocardiogram today. If there is no improvement in her ejection fraction, coronary angiogram will be considered. If not, she will need stress test as an outpatient. Strict ins and outs. Daily weights. Discussed briefly with Dr. Sallyanne Kuster. Will also discuss diuretics with cardiology.   Hypokalemia/Hypomagnesemia. This has been repleted.  Hyponatremia. Stable at this time. TSH was normal. As was cortisol level.  H/o GERD, Barrett's esophagus. Continue PPI.  Normocytic anemia Hemoglobin has dropped about 2 units over the last 3 days. No overt bleeding has been identified. We will check a stool, focal blood. Continue to monitor for now. No need for urgent transfusion.  DM type II. Continue CBG and SSI  Acute encephalopathy in setting of hypoxemia Resolved.  Hx of Depression, insomnia, anxiety. Resume paroxetine, alprazolam   S/p Rt knee arthroplasty. Per ortho. Can transition over to PRN narcotics from her PCA. PT and OT to continue.  DVT Prophylaxis: Cardiology has started Apixaban    Code Status: Full code    Family Communication: Discussed with the patient  Disposition Plan: Okay for transfer to telemetry    LOS: 4 days   [facility] Pager [phone] 06/23/2014, 11:01 AM  If 8PM-8AM, please contact night-coverage at [email or web address], password [facility]

## 2014-06-23 NOTE — Progress Notes (Signed)
Inpatient Diabetes Program Recommendations  AACE/ADA: New Consensus Statement on Inpatient Glycemic Control (2013)  Target Ranges:  Prepandial:   less than 140 mg/dL      Peak postprandial:   less than 180 mg/dL (1-2 hours)      Critically ill patients:  140 - 180 mg/dL     Results for KAREE, FORGE (MRN [id number]) as of 06/23/2014 07:55  Ref. Range 06/22/2014 08:07 06/22/2014 12:23 06/22/2014 16:32 06/22/2014 21:48  Glucose-Capillary Latest Range: 70-99 mg/dL 165 (H) 237 (H) 146 (H) 210 (H)     Patient having some issues with elevated postprandial CBGs.  Note home dose Metformin stopped this AM.    MD- Please consider adding Novolog Meal Coverage to current in-hospital insulin regimen- Novolog 3 units tid with meals    Will follow Wyn Quaker RN, MSN, CDE Diabetes Coordinator Inpatient Diabetes Program Team Pager: [phone] (8a-10p)

## 2014-06-23 NOTE — Evaluation (Signed)
Occupational Therapy Evaluation Patient Details Name: [name] [name] MRN: [id number] DOB: 1951/06/10 Today's Date: 06/23/2014    History of Present Illness 63 yo female adm 06/19/14 for R TKA; Pt  developed pulmonary edema during surgery and so it was decided to leave pt on vent,  pt also with afib with RVR, elevated troponin (felt to be demand ishcemia per Cards), was  extubated 06/20/14; PMHx:  HTN, MI, COPD, L TKA, shoulder surgery;   Pt is POD #2 at time of this PT eval;, rate controlled and back in NSR   Clinical Impression   Pt is s/p TKA resulting in the deficits listed below (see OT Problem List).  Pt will benefit from skilled OT to increase their safety and independence with ADL and functional mobility for ADL to facilitate discharge to venue listed below.      Follow Up Recommendations  SNF    Equipment Recommendations  None recommended by OT    Recommendations for Other Services       Precautions / Restrictions Precautions Precautions: Fall Precaution Comments: monitor HR/Sats Required Braces or Orthoses: Knee Immobilizer - Right Knee Immobilizer - Right: Discontinue once straight leg raise with < 10 degree lag Restrictions Weight Bearing Restrictions: No Other Position/Activity Restrictions: WBAT      Mobility Bed Mobility Overal bed mobility: Needs Assistance Bed Mobility: Supine to Sit     Supine to sit: Min guard     General bed mobility comments: incr time, pt motivated to self assist; min/guard for safety, impulsive  Transfers Overall transfer level: Needs assistance Equipment used: Rolling walker (2 wheeled) Transfers: Sit to/from Stand Sit to Stand: Min assist;From elevated surface Stand pivot transfers: Min assist       General transfer comment: verbal cues for hand placement, RW safety         ADL Overall ADL's : Needs assistance/impaired Eating/Feeding: Set up;Sitting   Grooming: Sitting;Wash/dry face   Upper Body Bathing:  Sitting;Set up   Lower Body Bathing: Sit to/from stand;Moderate assistance   Upper Body Dressing : Set up;Sitting   Lower Body Dressing: Sit to/from stand;Maximal assistance   Toilet Transfer: Moderate assistance;RW   Toileting- Clothing Manipulation and Hygiene: Moderate assistance;Sit to/from stand         General ADL Comments: Pt very agreeable - but in pain. RN called as knee was very red. RN marked area around knee that was reddened               Pertinent Vitals/Pain Pain Score: 10-Worst pain ever Pain Location: R knee Pain Descriptors / Indicators: Discomfort Pain Intervention(s): Limited activity within patient's tolerance        Extremity/Trunk Assessment Upper Extremity Assessment Upper Extremity Assessment: Generalized weakness              Cognition Arousal/Alertness: Awake/alert Behavior During Therapy: WFL for tasks assessed/performed Overall Cognitive Status: Within Functional Limits for tasks assessed                     General Comments                  Prior Functioning/Environment               OT Diagnosis: Generalized weakness   OT Problem List: Decreased strength;Decreased activity tolerance;Decreased safety awareness   OT Treatment/Interventions: Self-care/ADL training;DME and/or AE instruction;Patient/family education    OT Goals(Current goals can be found in the care plan section) Acute Rehab OT Goals Patient Stated Goal:  home, walk with less pain OT Goal Formulation: With patient Time For Goal Achievement: 07/07/14 Potential to Achieve Goals: Good  OT Frequency: Min 2X/week   Barriers to D/C:            Co-evaluation              End of Session Equipment Utilized During Treatment: Surveyor, mining Communication: Mobility status  Activity Tolerance: Patient tolerated treatment well Patient left: in bed   Time: 1400-1425 OT Time Calculation (min): 25 min Charges:  OT General Charges $OT  Visit: 1 Procedure OT Evaluation $Initial OT Evaluation Tier I: 1 Procedure OT Treatments $Self Care/Home Management : 8-22 mins G-Codes:    Payton Mccallum D 15-Jul-2014, 3:01 PM

## 2014-06-24 ENCOUNTER — Inpatient Hospital Stay (HOSPITAL_COMMUNITY)
Admission: RE | Admit: 2014-06-24 | Discharge: 2014-06-28 | DRG: 560 | Disposition: A | Discharge status: Home Health | Payer: Medicaid Other | Source: Intra-hospital | Attending: Physical Medicine & Rehabilitation | Admitting: Physical Medicine & Rehabilitation

## 2014-06-24 ENCOUNTER — Other Ambulatory Visit: Payer: Self-pay

## 2014-06-24 ENCOUNTER — Encounter (HOSPITAL_COMMUNITY): Payer: Self-pay | Admitting: *Deleted

## 2014-06-24 DIAGNOSIS — E118 Type 2 diabetes mellitus with unspecified complications: Secondary | ICD-10-CM

## 2014-06-24 DIAGNOSIS — Z8673 Personal history of transient ischemic attack (TIA), and cerebral infarction without residual deficits: Secondary | ICD-10-CM

## 2014-06-24 DIAGNOSIS — I5021 Acute systolic (congestive) heart failure: Secondary | ICD-10-CM

## 2014-06-24 DIAGNOSIS — I10 Essential (primary) hypertension: Secondary | ICD-10-CM | POA: Diagnosis not present

## 2014-06-24 DIAGNOSIS — I4891 Unspecified atrial fibrillation: Secondary | ICD-10-CM | POA: Diagnosis not present

## 2014-06-24 DIAGNOSIS — L0390 Cellulitis, unspecified: Secondary | ICD-10-CM | POA: Diagnosis not present

## 2014-06-24 DIAGNOSIS — E119 Type 2 diabetes mellitus without complications: Secondary | ICD-10-CM

## 2014-06-24 DIAGNOSIS — F411 Generalized anxiety disorder: Secondary | ICD-10-CM | POA: Diagnosis not present

## 2014-06-24 DIAGNOSIS — Z471 Aftercare following joint replacement surgery: Secondary | ICD-10-CM | POA: Diagnosis present

## 2014-06-24 DIAGNOSIS — J449 Chronic obstructive pulmonary disease, unspecified: Secondary | ICD-10-CM

## 2014-06-24 DIAGNOSIS — I248 Other forms of acute ischemic heart disease: Secondary | ICD-10-CM

## 2014-06-24 DIAGNOSIS — Z96651 Presence of right artificial knee joint: Secondary | ICD-10-CM

## 2014-06-24 DIAGNOSIS — D62 Acute posthemorrhagic anemia: Secondary | ICD-10-CM

## 2014-06-24 DIAGNOSIS — M1711 Unilateral primary osteoarthritis, right knee: Secondary | ICD-10-CM | POA: Diagnosis present

## 2014-06-24 DIAGNOSIS — I5181 Takotsubo syndrome: Secondary | ICD-10-CM | POA: Diagnosis not present

## 2014-06-24 LAB — BASIC METABOLIC PANEL
ANION GAP: 12 (ref 5–15)
BUN: 8 mg/dL (ref 6–23)
CALCIUM: 8.6 mg/dL (ref 8.4–10.5)
CO2: 26 meq/L (ref 19–32)
CREATININE: 0.62 mg/dL (ref 0.50–1.10)
Chloride: 97 mEq/L (ref 96–112)
GFR calc Af Amer: >90 mL/min (ref >90)
GFR calc non Af Amer: >90 mL/min (ref >90)
Glucose, Bld: 166 mg/dL — ABNORMAL HIGH (ref 70–99)
Potassium: 4.1 mEq/L (ref 3.7–5.3)
Sodium: 135 mEq/L — ABNORMAL LOW (ref 137–147)

## 2014-06-24 LAB — GLUCOSE, CAPILLARY
GLUCOSE-CAPILLARY: 196 mg/dL — AB (ref 70–99)
GLUCOSE-CAPILLARY: 114 mg/dL — AB (ref 70–99)
GLUCOSE-CAPILLARY: 215 mg/dL — AB (ref 70–99)
Glucose-Capillary: 140 mg/dL — ABNORMAL HIGH (ref 70–99)

## 2014-06-24 LAB — CBC
HEMATOCRIT: 26.2 % — AB (ref 36.0–46.0)
Hemoglobin: 8.7 g/dL — ABNORMAL LOW (ref 12.0–15.0)
MCH: 30.6 pg (ref 26.0–34.0)
MCHC: 33.2 g/dL (ref 30.0–36.0)
MCV: 92.3 fL (ref 78.0–100.0)
PLATELETS: 203 10*3/uL (ref 150–400)
RBC: 2.84 MIL/uL — AB (ref 3.87–5.11)
RDW: 14 % (ref 11.5–15.5)
WBC: 5.6 10*3/uL (ref 4.0–10.5)

## 2014-06-24 LAB — RETICULOCYTES
RBC.: 2.84 MIL/uL — ABNORMAL LOW (ref 3.87–5.11)
RETIC CT PCT: 2.5 % (ref 0.4–3.1)
Retic Count, Absolute: 71 10*3/uL (ref 19.0–186.0)

## 2014-06-24 LAB — IRON AND TIBC
Iron: 18 ug/dL — ABNORMAL LOW (ref 42–135)
SATURATION RATIOS: 9 % — AB (ref 20–55)
TIBC: 199 ug/dL — AB (ref 250–470)
UIBC: 181 ug/dL (ref 125–400)

## 2014-06-24 LAB — FERRITIN: FERRITIN: 199 ng/mL (ref 10–291)

## 2014-06-24 LAB — VITAMIN B12: Vitamin B-12: 1120 pg/mL — ABNORMAL HIGH (ref 211–911)

## 2014-06-24 LAB — FOLATE: Folate: 7.4 ng/mL

## 2014-06-24 MED ORDER — PROCHLORPERAZINE MALEATE 5 MG PO TABS
5.0000 mg | ORAL_TABLET | Freq: Four times a day (QID) | ORAL | Status: DC | PRN
  Filled 2014-06-24: qty 2

## 2014-06-24 MED ORDER — CIPROFLOXACIN HCL 250 MG PO TABS
250.0000 mg | ORAL_TABLET | Freq: Two times a day (BID) | ORAL | Status: DC
  Administered 2014-06-24 – 2014-06-28 (×8): 250 mg via ORAL
  Filled 2014-06-24 (×10): qty 1

## 2014-06-24 MED ORDER — MOMETASONE FURO-FORMOTEROL FUM 100-5 MCG/ACT IN AERO
1.0000 | INHALATION_SPRAY | Freq: Two times a day (BID) | RESPIRATORY_TRACT | Status: DC
  Administered 2014-06-24 – 2014-06-28 (×7): 1 via RESPIRATORY_TRACT
  Filled 2014-06-24: qty 8.8

## 2014-06-24 MED ORDER — PHENOL 1.4 % MT LIQD: 1.0000 | OROMUCOSAL | Status: DC | PRN

## 2014-06-24 MED ORDER — ALPRAZOLAM 0.25 MG PO TABS
0.5000 mg | ORAL_TABLET | Freq: Three times a day (TID) | ORAL | Status: DC | PRN
  Administered 2014-06-26 – 2014-06-28 (×2): 0.5 mg via ORAL
  Filled 2014-06-24 (×2): qty 2

## 2014-06-24 MED ORDER — GUAIFENESIN-DM 100-10 MG/5ML PO SYRP: 5.0000 mL | ORAL_SOLUTION | Freq: Four times a day (QID) | ORAL | Status: DC | PRN

## 2014-06-24 MED ORDER — PROCHLORPERAZINE 25 MG RE SUPP
12.5000 mg | Freq: Four times a day (QID) | RECTAL | Status: DC | PRN
  Filled 2014-06-24: qty 1

## 2014-06-24 MED ORDER — FLUTICASONE PROPIONATE 50 MCG/ACT NA SUSP
2.0000 | Freq: Every day | NASAL | Status: DC
  Administered 2014-06-25 – 2014-06-28 (×4): 2 via NASAL
  Filled 2014-06-24: qty 16

## 2014-06-24 MED ORDER — DIPHENHYDRAMINE HCL 12.5 MG/5ML PO ELIX: 12.5000 mg | ORAL_SOLUTION | Freq: Four times a day (QID) | ORAL | Status: DC | PRN

## 2014-06-24 MED ORDER — AMIODARONE HCL 200 MG PO TABS
200.0000 mg | ORAL_TABLET | Freq: Every day | ORAL | Status: DC
  Administered 2014-06-25 – 2014-06-28 (×4): 200 mg via ORAL
  Filled 2014-06-24 (×5): qty 1

## 2014-06-24 MED ORDER — ATORVASTATIN CALCIUM 40 MG PO TABS
40.0000 mg | ORAL_TABLET | Freq: Every day | ORAL | Status: DC
Start: 2014-06-24 — End: 2014-06-28
  Administered 2014-06-24 – 2014-06-27 (×4): 40 mg via ORAL
  Filled 2014-06-24 (×5): qty 1

## 2014-06-24 MED ORDER — SENNOSIDES-DOCUSATE SODIUM 8.6-50 MG PO TABS
2.0000 | ORAL_TABLET | Freq: Every day | ORAL | Status: DC
  Administered 2014-06-24: 2 via ORAL
  Filled 2014-06-24 (×2): qty 2

## 2014-06-24 MED ORDER — INSULIN ASPART 100 UNIT/ML ~~LOC~~ SOLN
0.0000 [IU] | Freq: Three times a day (TID) | SUBCUTANEOUS | Status: DC
Start: 2014-06-24 — End: 2014-06-28
  Administered 2014-06-25: 2 [IU] via SUBCUTANEOUS
  Administered 2014-06-25: 3 [IU] via SUBCUTANEOUS
  Administered 2014-06-25: 2 [IU] via SUBCUTANEOUS
  Administered 2014-06-26 – 2014-06-27 (×4): 3 [IU] via SUBCUTANEOUS
  Administered 2014-06-27 (×2): 2 [IU] via SUBCUTANEOUS
  Administered 2014-06-28 (×2): 3 [IU] via SUBCUTANEOUS

## 2014-06-24 MED ORDER — ASPIRIN EC 81 MG PO TBEC
81.0000 mg | DELAYED_RELEASE_TABLET | Freq: Every day | ORAL | Status: DC
  Administered 2014-06-25 – 2014-06-28 (×4): 81 mg via ORAL
  Filled 2014-06-24 (×5): qty 1

## 2014-06-24 MED ORDER — ACETAMINOPHEN 325 MG PO TABS
325.0000 mg | ORAL_TABLET | ORAL | Status: DC | PRN
  Administered 2014-06-24 – 2014-06-28 (×9): 650 mg via ORAL
  Filled 2014-06-24 (×9): qty 2

## 2014-06-24 MED ORDER — AMIODARONE HCL 200 MG PO TABS
200.0000 mg | ORAL_TABLET | Freq: Every day | ORAL | Status: DC
  Administered 2014-06-24: 200 mg via ORAL
  Filled 2014-06-24: qty 1

## 2014-06-24 MED ORDER — ALUM & MAG HYDROXIDE-SIMETH 200-200-20 MG/5ML PO SUSP
30.0000 mL | ORAL | Status: DC | PRN
  Administered 2014-06-24: 30 mL via ORAL
  Filled 2014-06-24: qty 30

## 2014-06-24 MED ORDER — FLEET ENEMA 7-19 GM/118ML RE ENEM
1.0000 | ENEMA | Freq: Once | RECTAL | Status: AC
  Administered 2014-06-24: 1 via RECTAL
  Filled 2014-06-24: qty 1

## 2014-06-24 MED ORDER — PANTOPRAZOLE SODIUM 40 MG PO TBEC
40.0000 mg | DELAYED_RELEASE_TABLET | Freq: Every day | ORAL | Status: DC
  Filled 2014-06-24: qty 1

## 2014-06-24 MED ORDER — ALBUTEROL SULFATE (2.5 MG/3ML) 0.083% IN NEBU: 2.5000 mg | INHALATION_SOLUTION | RESPIRATORY_TRACT | Status: DC | PRN

## 2014-06-24 MED ORDER — DOCUSATE SODIUM 100 MG PO CAPS
100.0000 mg | ORAL_CAPSULE | Freq: Two times a day (BID) | ORAL | Status: DC
  Administered 2014-06-24: 100 mg via ORAL
  Filled 2014-06-24 (×4): qty 1

## 2014-06-24 MED ORDER — PROCHLORPERAZINE EDISYLATE 5 MG/ML IJ SOLN
5.0000 mg | Freq: Four times a day (QID) | INTRAMUSCULAR | Status: DC | PRN
  Filled 2014-06-24: qty 2

## 2014-06-24 MED ORDER — NALOXONE HCL 0.4 MG/ML IJ SOLN: 0.4000 mg | INTRAMUSCULAR | Status: DC | PRN

## 2014-06-24 MED ORDER — CETYLPYRIDINIUM CHLORIDE 0.05 % MT LIQD
7.0000 mL | Freq: Four times a day (QID) | OROMUCOSAL | Status: DC
  Administered 2014-06-25 – 2014-06-28 (×11): 7 mL via OROMUCOSAL

## 2014-06-24 MED ORDER — METFORMIN HCL 500 MG PO TABS
500.0000 mg | ORAL_TABLET | Freq: Two times a day (BID) | ORAL | Status: DC
Start: 2014-06-24 — End: 2014-06-28
  Administered 2014-06-24 – 2014-06-28 (×8): 500 mg via ORAL
  Filled 2014-06-24 (×10): qty 1

## 2014-06-24 MED ORDER — MONTELUKAST SODIUM 10 MG PO TABS
10.0000 mg | ORAL_TABLET | Freq: Every day | ORAL | Status: DC
  Administered 2014-06-24 – 2014-06-27 (×4): 10 mg via ORAL
  Filled 2014-06-24 (×5): qty 1

## 2014-06-24 MED ORDER — POLYETHYLENE GLYCOL 3350 17 G PO PACK: 17.0000 g | PACK | Freq: Two times a day (BID) | ORAL | Status: DC

## 2014-06-24 MED ORDER — PAROXETINE HCL 20 MG PO TABS
20.0000 mg | ORAL_TABLET | Freq: Every day | ORAL | Status: DC
  Administered 2014-06-24 – 2014-06-27 (×4): 20 mg via ORAL
  Filled 2014-06-24 (×5): qty 1

## 2014-06-24 MED ORDER — APIXABAN 5 MG PO TABS
5.0000 mg | ORAL_TABLET | Freq: Two times a day (BID) | ORAL | Status: DC
Start: 2014-06-24 — End: 2014-06-28
  Administered 2014-06-24 – 2014-06-28 (×8): 5 mg via ORAL
  Filled 2014-06-24 (×10): qty 1

## 2014-06-24 MED ORDER — OXYCODONE HCL 5 MG PO TABS
5.0000 mg | ORAL_TABLET | ORAL | Status: DC | PRN
  Administered 2014-06-25 – 2014-06-28 (×17): 5 mg via ORAL
  Filled 2014-06-24 (×17): qty 1

## 2014-06-24 MED ORDER — METHOCARBAMOL 500 MG PO TABS
500.0000 mg | ORAL_TABLET | Freq: Four times a day (QID) | ORAL | Status: DC | PRN
  Administered 2014-06-24 – 2014-06-26 (×5): 500 mg via ORAL
  Filled 2014-06-24 (×5): qty 1

## 2014-06-24 MED ORDER — POLYETHYLENE GLYCOL 3350 17 G PO PACK
17.0000 g | PACK | Freq: Two times a day (BID) | ORAL | Status: DC
  Administered 2014-06-24 – 2014-06-28 (×5): 17 g via ORAL
  Filled 2014-06-24 (×10): qty 1

## 2014-06-24 NOTE — PMR Pre-admission (Signed)
PMR Admission Coordinator Pre-Admission Assessment  Patient: [name] [name] is an 63 y.o., female MRN: [id number] DOB: July 05, 1951 Height: 5' 0.5" (153.7 cm) Weight: 84 kg (185 lb 3 oz)              Insurance Information HMO:     PPO:      PCP:      IPA:      80/20:      OTHER:  PRIMARY:  Medicaid [place] Access      Policy#:  [id number]      Subscriber:  Self  CM Name:     Phone#:      Fax#:  Pre-Cert#:       Employer:  Benefits:  Phone #:  [phone]     Name:  Eff. Date:  09/30/10     Deduct:       Out of Pocket Max:       Life Max:  CIR:       SNF:  Outpatient:      Co-Pay:  Home Health:       Co-Pay:  DME:      Co-Pay:  Providers:    Emergency Contact Information Contact Information    Name Relation Home Work Mobile   Buxton,John Brother [phone]  [phone]   [place] Mother [phone]     No name specified         Current Medical History  Patient Admitting Diagnosis: right tka with post op respiratory failure/cardiac complications History of Present Illness: [name] [name] is a 63 y.o. 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 Post op course complicated by VDRF due to flash pulmonary edema as well as A fib with RVR. She was started on IV amiodarone and converted into NSR. 2D echo with EF 15-20 5 with diffuse hypokinesis. She was extubated on 11/20 and Dr. Ron Parker consulted for input and felt that edema was multifactorial and elevated troponin due to demand ischemia from acute pulmonary edema and A fib and felt that echo diagnostic of stress cardiomyopathy. She had recurrent A fib on 11/20 and was started on eliquis. Repeat echo with improvement in EF 55% and normal LV thickness/function and clinical progress consistent with Takotsubo syndrome. Cardiology recommends decreasing amiodarone to 200 mg/day, Eliquis for several months as well as [employer] for work up in the future.  Right knee incision been erythematous past 24 hours . Pt. Was admitted to IP Rehab on 06/24/14      Past Medical History  Past Medical History  Diagnosis Date  . Insomnia, unspecified   . Esophageal reflux   . Hyperlipemia   . Asthma   . Osteoporosis   . Arthritis   . COPD (chronic obstructive pulmonary disease)   . Depression   . Personal history of colonic polyps 09/2010    TUBULAR ADENOMAS (X3); NEGATIVE FOR HIGH GRADE DYSPLASIA OR MALIGNANCY.  . Barrett esophagus   . Hiatal hernia   . Esophageal stricture   . GERD (gastroesophageal reflux disease)   . GAD (generalized anxiety disorder)   . Aortic valve disorders   . Heart murmur   . Anxiety   . Shortness of breath     with exertion   . Pneumonia     hx of   . Type II or unspecified type diabetes mellitus without mention of complication, not stated as uncontrolled     on no meds   .  Anemia     hx of years ago   . Hypertension 12/07/2012  . Myocardial infarction 01-08-13    '06-Chest pain-no stent-dx. MI-stress related  . History of kidney stones 01-08-13    past hx.  . Transfusion history 01-08-13    past hx. many yrs ago  . Dysrhythmia     heart skips per pt   . 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 Urinary frequency     AND INCONTINENCE    Family History  family history includes Breast cancer in her maternal aunt, maternal grandmother, and mother; Colon cancer (age of onset: 68) in her brother; Colon polyps in her brother; Diabetes in her maternal grandfather; Heart disease in an other family member; Kidney disease in an other family member.  Prior Rehab/Hospitalizations: no prior rehab per pt.   Current Medications  Current facility-administered medications: 0.9 %  sodium chloride infusion, 250 mL, Intravenous, PRN, Vilinda Boehringer, MD, Last Rate: 10 mL/hr at 06/20/14 2123, 250 mL at 06/20/14 2123;  acetaminophen (TYLENOL) tablet 650 mg, 650 mg, Oral, Q6H PRN, 650 mg at 06/23/14 0506 **OR**  [DISCONTINUED] acetaminophen (TYLENOL) suppository 650 mg, 650 mg, Rectal, Q6H PRN, Tobi Bastos, MD albuterol (PROVENTIL) (2.5 MG/3ML) 0.083% nebulizer solution 2.5 mg, 2.5 mg, Nebulization, Q2H PRN, Chesley Mires, MD;  ALPRAZolam Duanne Moron) tablet 0.5 mg, 0.5 mg, Oral, TID PRN, Chesley Mires, MD, 0.5 mg at 06/23/14 2129;  alum & mag hydroxide-simeth (MAALOX/MYLANTA) 200-200-20 MG/5ML suspension 30 mL, 30 mL, Oral, Q4H PRN, Mauri Brooklyn, MD, 30 mL at 06/23/14 2216 amiodarone (PACERONE) tablet 200 mg, 200 mg, Oral, Daily, Sanda Klein, MD, 200 mg at 06/24/14 0948;  antiseptic oral rinse (CPC / CETYLPYRIDINIUM CHLORIDE 0.05%) solution 7 mL, 7 mL, Mouth Rinse, QID, Collene Gobble, MD, 7 mL at 06/24/14 1255;  apixaban (ELIQUIS) tablet 5 mg, 5 mg, Oral, BID, Sanda Klein, MD, 5 mg at 06/24/14 0947;  aspirin EC tablet 81 mg, 81 mg, Oral, Daily, Bonnielee Haff, MD, 81 mg at 06/24/14 0947 atorvastatin (LIPITOR) tablet 40 mg, 40 mg, Oral, QHS, Chesley Mires, MD, 40 mg at 06/23/14 2129;  chlorhexidine (PERIDEX) 0.12 % solution 15 mL, 15 mL, Mouth Rinse, BID, Collene Gobble, MD, 15 mL at 06/24/14 0814;  docusate sodium (COLACE) capsule 100 mg, 100 mg, Oral, BID, Bonnielee Haff, MD, 100 mg at 06/24/14 0948;  fluticasone (FLONASE) 50 MCG/ACT nasal spray 2 spray, 2 spray, Each Nare, Daily, Chesley Mires, MD, 2 spray at 06/24/14 0948 HYDROmorphone (DILAUDID) injection 0.5 mg, 0.5 mg, Intravenous, Q3H PRN, Bonnielee Haff, MD, 0.5 mg at 06/24/14 0814;  insulin aspart (novoLOG) injection 0-15 Units, 0-15 Units, Subcutaneous, TID WC, Colbert Coyer, MD, 5 Units at 06/24/14 1253;  insulin glargine (LANTUS) injection 10 Units, 10 Units, Subcutaneous, Daily, Wilhelmina Mcardle, MD, 10 Units at 06/24/14 0947 mometasone-formoterol (DULERA) 100-5 MCG/ACT inhaler 1 puff, 1 puff, Inhalation, BID, Chesley Mires, MD, 1 puff at 06/24/14 0907;  montelukast (SINGULAIR) tablet 10 mg, 10 mg, Oral, QHS, Chesley Mires, MD, 10 mg at 06/23/14 2129;   naloxone [facility]) injection 0.4 mg, 0.4 mg, Intravenous, PRN **AND** [DISCONTINUED] sodium chloride 0.9 % injection 9 mL, 9 mL, Intravenous, PRN, Chesley Mires, MD oxyCODONE (Oxy IR/ROXICODONE) immediate release tablet 5 mg, 5 mg, Oral, Q4H PRN, Bonnielee Haff, MD, 5 mg at 06/24/14 1322;  pantoprazole (PROTONIX) EC tablet 40 mg, 40 mg, Oral, Daily, Chesley Mires, MD, 40 mg at 06/24/14 0947;  PARoxetine (  PAXIL) tablet 20 mg, 20 mg, Oral, QHS, Chesley Mires, MD, 20 mg at 06/23/14 2129;  phenol (CHLORASEPTIC) mouth spray 1 spray, 1 spray, Mouth/Throat, PRN, Vishal Mungal, MD polyethylene glycol (MIRALAX / GLYCOLAX) packet 17 g, 17 g, Oral, BID, Bonnielee Haff, MD Facility-Administered Medications Ordered in Other Encounters: tranexamic acid (CYKLOKAPRON) 2,000 mg in sodium chloride 0.9 % 50 mL Topical Application, 6,834 mg, Topical, Once, Amber [employer], PA-C  Patients Current Diet: carb modified  Precautions / Restrictions Precautions Precautions: Fall Precaution Comments: monitor HR/Sats Restrictions Weight Bearing Restrictions: No Other Position/Activity Restrictions: WBAT   Prior Activity Level Limited Community (1-2x/wk): Pt. drives to near by grocery store once a week.  Brother takes her to MD appointments . Home Assistive Devices / Equipment Home Assistive Devices/Equipment: None Home Equipment: Environmental consultant - 2 wheels, Cane - single point, Bedside commode  Prior Functional Level Prior Function Level of Independence: Independent, Independent with assistive device(s)  Current Functional Level Cognition  Overall Cognitive Status: Within Functional Limits for tasks assessed Orientation Level: Oriented X4    Extremity Assessment (includes Sensation/Coordination)          ADLs  Overall ADL's : Needs assistance/impaired Eating/Feeding: Set up, Sitting Grooming: Sitting, Wash/dry face Upper Body Bathing: Sitting, Set up Lower Body Bathing: Sit to/from stand, Moderate  assistance Upper Body Dressing : Set up, Sitting Lower Body Dressing: Sit to/from stand, Maximal assistance Toilet Transfer: Moderate assistance, RW Toileting- Clothing Manipulation and Hygiene: Moderate assistance, Sit to/from stand General ADL Comments: Pt very agreeable - but in pain. RN called as knee was very red. RN marked area around knee that was reddened    Mobility  Overal bed mobility: Needs Assistance Bed Mobility: Supine to Sit Supine to sit: Min guard General bed mobility comments: incr time, pt motivated to self assist; min/guard for safety, impulsive    Transfers  Overall transfer level: Needs assistance Equipment used: Rolling walker (2 wheeled) Transfers: Sit to/from Stand Sit to Stand: Min assist, From elevated surface Stand pivot transfers: Min assist General transfer comment: verbal cues for hand placement, RW safety    Ambulation / Gait / Stairs / Wheelchair Mobility  Ambulation/Gait Ambulation/Gait assistance: Mod assist Ambulation Distance (Feet): 20 Feet (then 10') Assistive device: Rolling walker (2 wheeled) Gait Pattern/deviations: Step-to pattern, Decreased step length - right, Decreased stance time - right, Antalgic General Gait Details: cues to incr WBing on RLE, use of UEs for pain control, sequence    Posture / Balance      Special needs/care consideration BiPAP/CPAP  no CPM  no Continuous Drip IV  no Skin  Right knee incision covered with bandage; erythema surrounding knee, markde with marker for borders                               Bowel mgmt: last BM 06/18/14.  Per Rosezella Florida, RN, pt. Has fleets ordered to be given today Bladder mgmt: per pt., continent on 3 n 1 Diabetic mgmt yes     Previous Home Environment Living Arrangements: Alone Available Help at Discharge: Family, Available 24 hours/day Type of Home: House Home Layout: One level Home Access: Stairs to enter [employer] of Steps: 2 [place]: No Additional  Comments: pt will be staying with her mother and brother  Discharge Living Setting Plans for Discharge Living Setting: Patient's home Type of Home at Discharge: House Discharge Home Layout: Two level, Laundry or work area in basement Discharge  Home Access: Stairs to enter Entrance Stairs-Rails: None Entrance Stairs-Number of Steps: 2 Discharge Bathroom Shower/Tub: Tub/shower unit Discharge Bathroom Toilet: Standard Discharge Bathroom Accessibility: Yes How Accessible: Accessible via walker Does the patient have any problems obtaining your medications?: No  Social/Family/Support Systems Anticipated Caregiver: Mother, Chrysten Woulfe and brother Zahira Brummond will assist on PRN basis Ability/Limitations of Caregiver: Pt's mother is age 83 Caregiver Availability: Intermittent Discharge Plan Discussed with Primary Caregiver: Yes Is Caregiver In Agreement with Plan?: Yes Does Caregiver/Family have Issues with Lodging/Transportation while Pt is in Rehab?: No    Goals/Additional Needs Patient/Family Goal for Rehab: mod I for PT and OT; n/a SLP Expected length of stay: 10-14 days Cultural Considerations: "[place]" Equipment Needs: TBA Pt/Family Agrees to Admission and willing to participate: Yes (pt., Mom and brother Jenny Reichmann) Program Orientation Provided & Reviewed with Pt/Caregiver Including Roles  & Responsibilities: Yes   Decrease burden of Care through IP rehab admission:   no   Possible need for SNF placement upon discharge:  Not expected   Patient Condition: This patient's condition remains as documented in the consult dated 06/24/14  the Rehabilitation Physician determined and documented that the patient's condition is appropriate for intensive rehabilitative care in an inpatient rehabilitation facility. Will admit to inpatient rehab today.  Preadmission Screen Completed By:  Gerlean Ren, 06/24/2014 1:31  PM ______________________________________________________________________   Discussed status with Dr. Naaman Plummer on 06/24/14  At 1331 and received telephone approval for admission today.    Admission Coordinator:  Gerlean Ren, time 1331  On 06/24/14.

## 2014-06-24 NOTE — Progress Notes (Signed)
Patient arrived to unit around 1530 from [facility]. Pt oriented to unit and safety plan discussed and signed with patient. Rehab routines explained and pt verbalized understanding, cont plan of care. Neave Lenger, Dione Plover

## 2014-06-24 NOTE — Progress Notes (Signed)
[name], MD Physician Signed Physical Medicine and Rehabilitation PMR Pre-admission 06/24/2014 1:18 PM  Related encounter: Admission (Discharged) from 06/19/2014 in [facility]   PMR Admission Coordinator Pre-Admission Assessment  Patient: [name] [name] is an 63 y.o., female MRN: [id number] DOB: October 01, 1950 Height: 5' 0.5" (153.7 cm) Weight: 84 kg (185 lb 3 oz)  Insurance Information HMO: PPO: PCP: IPA: 80/20: OTHER:  PRIMARY: Medicaid [place] Access Policy#: [id number] k Subscriber: Self CM Name: Phone#: Fax#:  Pre-Cert#: Employer:  Benefits: Phone #: [phone] Name:  Eff. Date: 09/30/10 Deduct: Out of Pocket Max: Life Max:  CIR: SNF:  Outpatient: Co-Pay:  Home Health: Co-Pay:  DME: Co-Pay:  Providers:    Emergency Contact Information Contact Information    Name Relation Home Work Mobile   K[place],John Brother [phone]  [phone]   [place] Mother [phone]     No name specified         Current Medical History  Patient Admitting Diagnosis: right tka with post op respiratory failure/cardiac complications History of Present Illness: [name] [name] is a 63 y.o. 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 Post op course complicated by VDRF due to flash pulmonary edema as well as A fib with RVR. She was started on IV amiodarone and converted into NSR. 2D echo with EF 15-20 5 with diffuse hypokinesis. She was extubated on 11/20 and Dr. Ron Parker consulted for input and felt that edema was multifactorial and elevated troponin due to demand  ischemia from acute pulmonary edema and A fib and felt that echo diagnostic of stress cardiomyopathy. She had recurrent A fib on 11/20 and was started on eliquis. Repeat echo with improvement in EF 55% and normal LV thickness/function and clinical progress consistent with Takotsubo syndrome. Cardiology recommends decreasing amiodarone to 200 mg/day, Eliquis for several months as well as [employer] for work up in the future. Right knee incision been erythematous past 24 hours . Pt. Was admitted to IP Rehab on 06/24/14      Past Medical History  Past Medical History  Diagnosis Date  . Insomnia, unspecified   . Esophageal reflux   . Hyperlipemia   . Asthma   . Osteoporosis   . Arthritis   . COPD (chronic obstructive pulmonary disease)   . Depression   . Personal history of colonic polyps 09/2010    TUBULAR ADENOMAS (X3); NEGATIVE FOR HIGH GRADE DYSPLASIA OR MALIGNANCY.  . Barrett esophagus   . Hiatal hernia   . Esophageal stricture   . GERD (gastroesophageal reflux disease)   . GAD (generalized anxiety disorder)   . Aortic valve disorders   . Heart murmur   . Anxiety   . Shortness of breath     with exertion   . Pneumonia     hx of   . Type II or unspecified type diabetes mellitus without mention of complication, not stated as uncontrolled     on no meds   . Anemia     hx of years ago   . Hypertension 12/07/2012  . Myocardial infarction 01-08-13    '06-Chest pain-no stent-dx. MI-stress related  . History of kidney stones 01-08-13    past hx.  . Transfusion history 01-08-13    past hx. many yrs ago  . Dysrhythmia     heart skips per pt   . Stroke  hx of 2 -remains with some right sided weaknes  . Urinary frequency     AND INCONTINENCE    Family History  family history includes Breast cancer in her maternal aunt, maternal grandmother, and mother;  Colon cancer (age of onset: 102) in her brother; Colon polyps in her brother; Diabetes in her maternal grandfather; Heart disease in an other family member; Kidney disease in an other family member.  Prior Rehab/Hospitalizations: no prior rehab per pt.  Current Medications  Current facility-administered medications: 0.9 % sodium chloride infusion, 250 mL, Intravenous, PRN, Vilinda Boehringer, MD, Last Rate: 10 mL/hr at 06/20/14 2123, 250 mL at 06/20/14 2123; acetaminophen (TYLENOL) tablet 650 mg, 650 mg, Oral, Q6H PRN, 650 mg at 06/23/14 0506 **OR** [DISCONTINUED] acetaminophen (TYLENOL) suppository 650 mg, 650 mg, Rectal, Q6H PRN, Tobi Bastos, MD albuterol (PROVENTIL) (2.5 MG/3ML) 0.083% nebulizer solution 2.5 mg, 2.5 mg, Nebulization, Q2H PRN, Chesley Mires, MD; ALPRAZolam Duanne Moron) tablet 0.5 mg, 0.5 mg, Oral, TID PRN, Chesley Mires, MD, 0.5 mg at 06/23/14 2129; alum & mag hydroxide-simeth (MAALOX/MYLANTA) 200-200-20 MG/5ML suspension 30 mL, 30 mL, Oral, Q4H PRN, Mauri Brooklyn, MD, 30 mL at 06/23/14 2216 amiodarone (PACERONE) tablet 200 mg, 200 mg, Oral, Daily, Sanda Klein, MD, 200 mg at 06/24/14 0948; antiseptic oral rinse (CPC / CETYLPYRIDINIUM CHLORIDE 0.05%) solution 7 mL, 7 mL, Mouth Rinse, QID, Collene Gobble, MD, 7 mL at 06/24/14 1255; apixaban (ELIQUIS) tablet 5 mg, 5 mg, Oral, BID, Sanda Klein, MD, 5 mg at 06/24/14 0947; aspirin EC tablet 81 mg, 81 mg, Oral, Daily, Bonnielee Haff, MD, 81 mg at 06/24/14 0947 atorvastatin (LIPITOR) tablet 40 mg, 40 mg, Oral, QHS, Chesley Mires, MD, 40 mg at 06/23/14 2129; chlorhexidine (PERIDEX) 0.12 % solution 15 mL, 15 mL, Mouth Rinse, BID, Collene Gobble, MD, 15 mL at 06/24/14 0814; docusate sodium (COLACE) capsule 100 mg, 100 mg, Oral, BID, Bonnielee Haff, MD, 100 mg at 06/24/14 0948; fluticasone (FLONASE) 50 MCG/ACT nasal spray 2 spray, 2 spray, Each Nare, Daily, Chesley Mires, MD, 2 spray at 06/24/14 0948 HYDROmorphone (DILAUDID) injection 0.5  mg, 0.5 mg, Intravenous, Q3H PRN, Bonnielee Haff, MD, 0.5 mg at 06/24/14 0814; insulin aspart (novoLOG) injection 0-15 Units, 0-15 Units, Subcutaneous, TID WC, Colbert Coyer, MD, 5 Units at 06/24/14 1253; insulin glargine (LANTUS) injection 10 Units, 10 Units, Subcutaneous, Daily, Wilhelmina Mcardle, MD, 10 Units at 06/24/14 0947 mometasone-formoterol (DULERA) 100-5 MCG/ACT inhaler 1 puff, 1 puff, Inhalation, BID, Chesley Mires, MD, 1 puff at 06/24/14 0907; montelukast (SINGULAIR) tablet 10 mg, 10 mg, Oral, QHS, Chesley Mires, MD, 10 mg at 06/23/14 2129; naloxone [facility]) injection 0.4 mg, 0.4 mg, Intravenous, PRN **AND** [DISCONTINUED] sodium chloride 0.9 % injection 9 mL, 9 mL, Intravenous, PRN, Chesley Mires, MD oxyCODONE (Oxy IR/ROXICODONE) immediate release tablet 5 mg, 5 mg, Oral, Q4H PRN, Bonnielee Haff, MD, 5 mg at 06/24/14 1322; pantoprazole (PROTONIX) EC tablet 40 mg, 40 mg, Oral, Daily, Chesley Mires, MD, 40 mg at 06/24/14 0947; PARoxetine (PAXIL) tablet 20 mg, 20 mg, Oral, QHS, Chesley Mires, MD, 20 mg at 06/23/14 2129; phenol (CHLORASEPTIC) mouth spray 1 spray, 1 spray, Mouth/Throat, PRN, Vishal Mungal, MD polyethylene glycol (MIRALAX / GLYCOLAX) packet 17 g, 17 g, Oral, BID, Bonnielee Haff, MD Facility-Administered Medications Ordered in Other Encounters: tranexamic acid (CYKLOKAPRON) 2,000 mg in sodium chloride 0.9 % 50 mL Topical Application, 6,767 mg, Topical, Once, Amber Renelda Loma, PA-C  Patients Current Diet: carb modified  Precautions / Restrictions Precautions Precautions: Fall Precaution Comments: monitor  HR/Sats Restrictions Weight Bearing Restrictions: No Other Position/Activity Restrictions: WBAT   Prior Activity Level Limited Community (1-2x/wk): Pt. drives to near by grocery store once a week. Brother takes her to MD appointments . Home Assistive Devices / Equipment Home Assistive Devices/Equipment: None Home Equipment: Environmental consultant - 2 wheels, Cane - single point,  Bedside commode  Prior Functional Level Prior Function Level of Independence: Independent, Independent with assistive device(s)  Current Functional Level Cognition  Overall Cognitive Status: Within Functional Limits for tasks assessed Orientation Level: Oriented X4   Extremity Assessment (includes Sensation/Coordination)          ADLs  Overall ADL's : Needs assistance/impaired Eating/Feeding: Set up, Sitting Grooming: Sitting, Wash/dry face Upper Body Bathing: Sitting, Set up Lower Body Bathing: Sit to/from stand, Moderate assistance Upper Body Dressing : Set up, Sitting Lower Body Dressing: Sit to/from stand, Maximal assistance Toilet Transfer: Moderate assistance, RW Toileting- Clothing Manipulation and Hygiene: Moderate assistance, Sit to/from stand General ADL Comments: Pt very agreeable - but in pain. RN called as knee was very red. RN marked area around knee that was reddened    Mobility  Overal bed mobility: Needs Assistance Bed Mobility: Supine to Sit Supine to sit: Min guard General bed mobility comments: incr time, pt motivated to self assist; min/guard for safety, impulsive    Transfers  Overall transfer level: Needs assistance Equipment used: Rolling walker (2 wheeled) Transfers: Sit to/from Stand Sit to Stand: Min assist, From elevated surface Stand pivot transfers: Min assist General transfer comment: verbal cues for hand placement, RW safety    Ambulation / Gait / Stairs / Wheelchair Mobility  Ambulation/Gait Ambulation/Gait assistance: Mod assist Ambulation Distance (Feet): 20 Feet (then 10') Assistive device: Rolling walker (2 wheeled) Gait Pattern/deviations: Step-to pattern, Decreased step length - right, Decreased stance time - right, Antalgic General Gait Details: cues to incr WBing on RLE, use of UEs for pain control, sequence    Posture / Balance      Special needs/care consideration BiPAP/CPAP no CPM no Continuous  Drip IV no Skin Right knee incision covered with bandage; erythema surrounding knee, markde with marker for borders  Bowel mgmt: last BM 06/18/14. Per Rosezella [place], RN, pt. Has fleets ordered to be given today Bladder mgmt: per pt., continent on 3 n 1 Diabetic mgmt yes     Previous Home Environment Living Arrangements: Alone Available Help at Discharge: Family, Available 24 hours/day Type of Home: House Home Layout: One level Home Access: Stairs to enter Technical brewer of Steps: 2 [place]: No Additional Comments: pt will be staying with her mother and brother  Discharge Living Setting Plans for Discharge Living Setting: Patient's home Type of Home at Discharge: House Discharge Home Layout: Two level, Laundry or work area in basement Discharge Home Access: Stairs to enter Entrance Stairs-Rails: None Technical brewer of Steps: 2 Discharge Bathroom Shower/Tub: Tub/shower unit Discharge Bathroom Toilet: Standard Discharge Bathroom Accessibility: Yes How Accessible: Accessible via walker Does the patient have any problems obtaining your medications?: No  Social/Family/Support Systems Anticipated Caregiver: Mother, Shyenne Maggard and brother Gavina Dildine will assist on PRN basis Ability/Limitations of Caregiver: Pt's mother is age 58 Caregiver Availability: Intermittent Discharge Plan Discussed with Primary Caregiver: Yes Is Caregiver In Agreement with Plan?: Yes Does Caregiver/Family have Issues with Lodging/Transportation while Pt is in Rehab?: No    Goals/Additional Needs Patient/Family Goal for Rehab: mod I for PT and OT; n/a SLP Expected length of stay: 10-14 days Cultural Considerations: "[place]" Equipment Needs:  TBA Pt/Family Agrees to Admission and willing to participate: Yes (pt., Mom and brother Jenny Reichmann) Program Orientation Provided & Reviewed with Pt/Caregiver Including Roles & Responsibilities:  Yes   Decrease burden of Care through IP rehab admission: no   Possible need for SNF placement upon discharge: Not expected   Patient Condition: This patient's condition remains as documented in the consult dated 06/24/14 the Rehabilitation Physician determined and documented that the patient's condition is appropriate for intensive rehabilitative care in an inpatient rehabilitation facility. Will admit to inpatient rehab today.  Preadmission Screen Completed By: Gerlean Ren, 06/24/2014 1:31 PM ______________________________________________________________________  Discussed status with Dr. Naaman Plummer on 06/24/14 At 1331 and received telephone approval for admission today.   Admission Coordinator: Gerlean Ren, time 1331 On 06/24/14.

## 2014-06-24 NOTE — Progress Notes (Signed)
  Amiodarone Drug - Drug Interaction Consult Note  Recommendations: There are no medication adjustments needed for amiodarone drug interactions.  Amiodarone is metabolized by the cytochrome P450 system and therefore has the potential to cause many drug interactions. Amiodarone has an average plasma half-life of 50 days (range 20 to 100 days).   There is potential for drug interactions to occur several weeks or months after stopping treatment and the onset of drug interactions may be slow after initiating amiodarone.   [x]  Statins: Increased risk of myopathy. Simvastatin- restrict dose to 20mg  daily. Other statins: counsel patients to report any muscle pain or weakness immediately.  []  Anticoagulants: Amiodarone can increase anticoagulant effect. Consider warfarin dose reduction. Patients should be monitored closely and the dose of anticoagulant altered accordingly, remembering that amiodarone levels take several weeks to stabilize.  []  Antiepileptics: Amiodarone can increase plasma concentration of phenytoin, the dose should be reduced. Note that small changes in phenytoin dose can result in large changes in levels. Monitor patient and counsel on signs of toxicity.  []  Beta blockers: increased risk of bradycardia, AV block and myocardial depression. Sotalol - avoid concomitant use.  []   Calcium channel blockers (diltiazem and verapamil): increased risk of bradycardia, AV block and myocardial depression.  []   Cyclosporine: Amiodarone increases levels of cyclosporine. Reduced dose of cyclosporine is recommended.  []  Digoxin dose should be halved when amiodarone is started.  []  Diuretics: increased risk of cardiotoxicity if hypokalemia occurs.  []  Oral hypoglycemic agents (glyburide, glipizide, glimepiride): increased risk of hypoglycemia. Patient's glucose levels should be monitored closely when initiating amiodarone therapy.   []  Drugs that prolong the QT interval:  Torsades de pointes  risk may be increased with concurrent use - avoid if possible.  Monitor QTc, also keep magnesium/potassium WNL if concurrent therapy can't be avoided. [name] Antibiotics: e.g. fluoroquinolones, erythromycin. . Antiarrhythmics: e.g. quinidine, procainamide, disopyramide, sotalol. . Antipsychotics: e.g. phenothiazines, haloperidol.  . Lithium, tricyclic antidepressants, and methadone. Thank You,   Bonnita Nasuti Pharm.D. CPP, BCPS Clinical Pharmacist [phone] 06/24/2014 4:45 PM

## 2014-06-24 NOTE — H&P (Addendum)
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 Post op course complicated by VDRF due to flash pulmonary edema as well as A fib with RVR. She was started on IV amiodarone and converted into NSR. 2D echo with EF 15-20 5 with diffuse hypokinesis. She was extubated on 11/20 and Dr. Ron Parker consulted for input and felt that edema was multifactorial and elevated troponin due to demand ischemia from acute pulmonary edema and A fib and felt that echo diagnostic of stress cardiomyopathy. She had recurrent A fib on 11/20 and was started on eliquis. Repeat echo with improvement in EF 55% and normal LV thickness/function and clinical progress consistent with Takotsubo syndrome. Cardiology recommends decreasing amiodarone to 200 mg/day, Eliquis for several months as well as [employer] for work up in the future. Right knee incision and surrounding site has been erythematous past 24 hours .    ROS  HENT: Negative for hearing loss.  Eyes: Negative for blurred vision and double vision.  Respiratory: Positive for shortness of breath (with activity). Negative for wheezing.  Cardiovascular: Negative for chest pain and palpitations.  Gastrointestinal: Positive for constipation (No BM since surgery). Negative for heartburn and nausea.  Genitourinary: Negative for urgency and frequency.  Musculoskeletal: Positive for myalgias and falls (for past 3 months).  Neurological: Positive for sensory change (RLE numbness intermittently with give away). Negative for dizziness and headaches.  Psychiatric/Behavioral: The patient does not have insomnia.     Past Medical History  Diagnosis Date  . Insomnia, unspecified     . Esophageal reflux   . Hyperlipemia   . Asthma   . Osteoporosis   . Arthritis   . COPD (chronic obstructive pulmonary disease)   . Depression   . Personal history of colonic polyps 09/2010    TUBULAR ADENOMAS (X3); NEGATIVE FOR HIGH GRADE DYSPLASIA OR MALIGNANCY.  . Barrett esophagus   . Hiatal hernia   . Esophageal stricture   . GERD (gastroesophageal reflux disease)   . GAD (generalized anxiety disorder)   . Aortic valve disorders   . Heart murmur   . Anxiety   . Shortness of breath     with exertion   . Pneumonia     hx of   . Type II or unspecified type diabetes mellitus without mention of complication, not stated as uncontrolled     on no meds   . Anemia     hx of years ago   . Hypertension 12/07/2012  . Myocardial infarction 01-08-13    '06-Chest pain-no stent-dx. MI-stress related  . History of kidney stones 01-08-13    past hx.  . Transfusion history 01-08-13    past hx. many yrs ago  . Dysrhythmia     heart skips per pt   . 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 Urinary frequency     AND INCONTINENCE    Past Surgical History  Procedure Laterality Date  . Cholecystectomy  2005  . Partial hysterectomy    . Shoulder surgery  2011  . Tubal ligation    . Tonsillectomy    . Foot surgery  2005  Left foot  . Wrist flexion tendon tenotomies and proximal corpectomy w/ wrist arthrodesis&iliac crest bone graft    . Cardiac catheterization      normal per Dr Acie Fredrickson in note dated 02/06/12   . Knee arthroscopy  02/28/2012    Procedure: ARTHROSCOPY KNEE; Surgeon: Tobi Bastos, MD; Location: [facility]; Service: Orthopedics; Laterality: Left;  . Esophageal dilation  01-08-13    2 yrs ago  . Cataract extraction, bilateral    . Total knee arthroplasty Left 01/17/2013     Procedure: LEFT TOTAL KNEE ARTHROPLASTY; Surgeon: Tobi Bastos, MD; Location: [facility]; Service: Orthopedics; Laterality: Left;  . Total knee arthroplasty Right 06/19/2014    Procedure: RIGHT TOTAL KNEE ARTHROPLASTY; Surgeon: Tobi Bastos, MD; Location: [facility]; Service: Orthopedics; Laterality: Right;    Family History  Problem Relation Age of Onset  . Breast cancer Mother   . Diabetes Maternal Grandfather   . Kidney disease      Both sides of family  . Heart disease      Both sides of family  . Colon cancer Brother 5  . Colon polyps Brother   . Breast cancer Maternal Aunt   . Breast cancer Maternal Grandmother     Social History: Lives alone. Disabled since '85 due to stroke? Independent with AD. She reports that she quit smoking 2 months ago. Her smoking use included Cigarettes. She has a 10 pack-year smoking history. She quit smokeless tobacco use about 2 years ago. Her smokeless tobacco use included Snuff and Chew. She reports that she drinks about 0.6 oz of alcohol per week. She reports that she does not use illicit drugs.    Allergies  Allergen Reactions  . Other Anaphylaxis    BEE STINGS- CLOSE OFF THROAT  . Penicillins Anaphylaxis    "CLOSES OFF MY BREATHING"  . Pneumococcal Vaccines Other (See Comments)    PP-23 vaccine; had BIG local red reaction with heat.   Jaclyn Prime [Ibandronate Sodium]     Jaw popping  . Ceclor [Cefaclor] Other (See Comments)    Reaction=burning all over  . Codeine Nausea And Vomiting  . Cortisone Hives    All over body   Medications Prior to Admission  Medication Sig Dispense Refill  . albuterol (PROVENTIL HFA;VENTOLIN HFA) 108 (90 BASE) MCG/ACT inhaler Inhale 2 puffs into the lungs every 4 (four) hours as needed for wheezing or shortness of breath. Take two puffs four times a day as needed 1 Inhaler prn  . albuterol  (PROVENTIL) (2.5 MG/3ML) 0.083% nebulizer solution Take 3 mLs (2.5 mg total) by nebulization every 6 (six) hours as needed for wheezing. DX 491.9 150 mL 12  . ALPRAZolam (XANAX) 0.5 MG tablet Take 0.5 mg by mouth 3 (three) times daily.    [name] aspirin EC 81 MG tablet Take 81 mg by mouth every morning.     [name] atorvastatin (LIPITOR) 40 MG tablet Take 1 tablet (40 mg total) by mouth at bedtime. 30 tablet 0  . benzonatate (TESSALON) 200 MG capsule Take 1 capsule (200 mg total) by mouth 3 (three) times daily as needed for cough. 30 capsule 2  . calcium carbonate (OS-CAL) 600 MG TABS Take 600 mg by mouth at bedtime.     . Cholecalciferol (VITAMIN D) 1000 UNITS capsule Take 1,000 Units by mouth at bedtime.     [name] dexlansoprazole (DEXILANT) 60 MG capsule Take 1 capsule (60 mg total) by mouth every morning. 30 capsule 5  . docusate sodium (COLACE)  100 MG capsule Take 100 mg by mouth 2 (two) times daily.    . ferrous sulfate 325 (65 FE) MG tablet Take 325 mg by mouth 2 (two) times daily.     . fluticasone (FLONASE) 50 MCG/ACT nasal spray Place 2 sprays into the nose daily. (Patient taking differently: Place 1 spray into the nose 2 (two) times daily. ) 16 g prn  . furosemide (LASIX) 40 MG tablet Take 40 mg by mouth every morning.    . Menthol (HONEY LEMON COUGH DROPS MT) Use as directed in the mouth or throat. Taking approx. 8-9 daily    . metFORMIN (GLUCOPHAGE) 500 MG tablet Take 1 tablet (500 mg total) by mouth daily. Takes at bedtime. (Patient taking differently: Take 500 mg by mouth 2 (two) times daily. ) 30 tablet 0  . mometasone-formoterol (DULERA) 100-5 MCG/ACT AERO 2 puffs then rinse mouth twice daily maintenance controller (Patient taking differently: Inhale 1 puff into the lungs 2 (two) times daily. ) 1 Inhaler prn  . montelukast (SINGULAIR) 10 MG tablet Take 1 tablet (10 mg total) by mouth at bedtime. 30 tablet 11  .  PARoxetine (PAXIL) 20 MG tablet Take 20 mg by mouth at bedtime.     . vitamin B-12 (CYANOCOBALAMIN) 1000 MCG tablet Take 1,000 mcg by mouth at bedtime.     . CHANTIX STARTING MONTH PAK 0.5 MG X 11 & 1 MG X 42 tablet TAKE AS DIRECTED 53 tablet 0  . EPINEPHrine 0.3 mg/0.3 mL IJ SOAJ injection Inject 0.3 mLs (0.3 mg total) into the muscle once. (Patient taking differently: Inject 0.3 mg into the muscle once as needed. EPI PEN - PT ALLERGIC TO BEES) 1 Device 1    Home: Home Living Family/patient expects to be discharged to:: Private residence Living Arrangements: Alone Type of Home: House Home Access: Stairs to enter Technical brewer of Steps: 2 Home Layout: One level Home Equipment: Environmental consultant - 2 wheels, Cane - single point, Bedside commode Additional Comments: pt will be staying with her mother and brother  Functional History: Prior Function Level of Independence: Independent, Independent with assistive device(s)  Functional Status:  Mobility: Bed Mobility Overal bed mobility: Needs Assistance Bed Mobility: Supine to Sit Supine to sit: Min guard General bed mobility comments: incr time, pt motivated to self assist; min/guard for safety, impulsive Transfers Overall transfer level: Needs assistance Equipment used: Rolling walker (2 wheeled) Transfers: Sit to/from Stand Sit to Stand: Min assist, From elevated surface Stand pivot transfers: Min assist General transfer comment: verbal cues for hand placement, RW safety Ambulation/Gait Ambulation/Gait assistance: Mod assist Ambulation Distance (Feet): 20 Feet (then 10') Assistive device: Rolling walker (2 wheeled) Gait Pattern/deviations: Step-to pattern, Decreased step length - right, Decreased stance time - right, Antalgic General Gait Details: cues to incr WBing on RLE, use of UEs for pain control, sequence    ADL: ADL Overall ADL's : Needs assistance/impaired Eating/Feeding: Set up,  Sitting Grooming: Sitting, Wash/dry face Upper Body Bathing: Sitting, Set up Lower Body Bathing: Sit to/from stand, Moderate assistance Upper Body Dressing : Set up, Sitting Lower Body Dressing: Sit to/from stand, Maximal assistance Toilet Transfer: Moderate assistance, RW Toileting- Clothing Manipulation and Hygiene: Moderate assistance, Sit to/from stand General ADL Comments: Pt very agreeable - but in pain. RN called as knee was very red. RN marked area around knee that was reddened  Cognition: Cognition Overall Cognitive Status: Within Functional Limits for tasks assessed Orientation Level: Oriented X4 Cognition Arousal/Alertness: Awake/alert Behavior During Therapy: [facility] for  tasks assessed/performed Overall Cognitive Status: Within Functional Limits for tasks assessed Memory: Decreased short-term memory (pt does not recall having PT yesterday)   Blood pressure 128/60, pulse 70, temperature 98.2 F (36.8 C), temperature source Oral, resp. rate 18, height 5' 0.5" (1.537 m), weight 84 kg (185 lb 3 oz), SpO2 94 %. Physical Exam Nursing note and vitals reviewed. Constitutional: She is oriented to person, place, and time. She appears well-developed and well-nourished.  HENT: edentulous, oral mucosa pink and moist Head: Atraumatic.  Eyes: Conjunctivae are normal. Pupils are equal, round, and reactive to light.  Neck: Normal range of motion. Neck supple.  Cardiovascular: Normal rate and irregularly irregular rhythm.  Respiratory: Effort normal and breath sounds normal. No respiratory distress. She has no wheezes.  GI: Soft. Bowel sounds are normal. She exhibits no distension. There is no tenderness.  Musculoskeletal:  Right knee with surgical dressing in place--warm to touch, tender, erythema particularly around the medial aspect of the knee. The incision itself looks clean. LLE with well healed old TKR incision.  Neurological: She is alert and oriented to person, place, and  time. UES: 5/5 proximal to distal. RLE: limited by pain/knee surgery. Right ankle 4/5. LLE: 3+/5 HF, 4 left knee, and 4+ ankle. No appreciable sensory deficits Skin: Skin is warm and dry.  Psych: pt is pleasant and cooperative    Lab Results Last 48 Hours    Results for orders placed or performed during the hospital encounter of 06/19/14 (from the past 48 hour(s))  Glucose, capillary Status: Abnormal   Collection Time: 06/22/14 12:23 PM  Result Value Ref Range   Glucose-Capillary 237 (H) 70 - 99 mg/dL  Glucose, capillary Status: Abnormal   Collection Time: 06/22/14 4:32 PM  Result Value Ref Range   Glucose-Capillary 146 (H) 70 - 99 mg/dL  Glucose, capillary Status: Abnormal   Collection Time: 06/22/14 9:48 PM  Result Value Ref Range   Glucose-Capillary 210 (H) 70 - 99 mg/dL  Basic metabolic panel Status: Abnormal   Collection Time: 06/23/14 4:30 AM  Result Value Ref Range   Sodium 133 (L) 137 - 147 mEq/L   Potassium 4.5 3.7 - 5.3 mEq/L    Comment: DELTA CHECK NOTED REPEATED TO VERIFY NO VISIBLE HEMOLYSIS    Chloride 97 96 - 112 mEq/L   CO2 25 19 - 32 mEq/L   Glucose, Bld 161 (H) 70 - 99 mg/dL   BUN 8 6 - 23 mg/dL   Creatinine, Ser 0.61 0.50 - 1.10 mg/dL   Calcium 8.6 8.4 - 10.5 mg/dL   GFR calc non Af Amer >90 >90 mL/min   GFR calc Af Amer >90 >90 mL/min    Comment: (NOTE) The eGFR has been calculated using the CKD EPI equation. This calculation has not been validated in all clinical situations. eGFR's persistently <90 mL/min signify possible Chronic Kidney Disease.    Anion gap 11 5 - 15  Magnesium Status: None   Collection Time: 06/23/14 4:30 AM  Result Value Ref Range   Magnesium 2.1 1.5 - 2.5 mg/dL  CBC Status: Abnormal   Collection Time: 06/23/14 4:30 AM  Result Value Ref Range   WBC 5.5 4.0 - 10.5 K/uL   RBC 2.86 (L)  3.87 - 5.11 MIL/uL   Hemoglobin 8.9 (L) 12.0 - 15.0 g/dL   HCT 25.8 (L) 36.0 - 46.0 %   MCV 90.2 78.0 - 100.0 fL   MCH 31.1 26.0 - 34.0 pg   MCHC 34.5 30.0 - 36.0 g/dL  RDW 13.7 11.5 - 15.5 %   Platelets 177 150 - 400 K/uL    Comment: REPEATED TO VERIFY DELTA CHECK NOTED   Glucose, capillary Status: Abnormal   Collection Time: 06/23/14 8:11 AM  Result Value Ref Range   Glucose-Capillary 149 (H) 70 - 99 mg/dL  Glucose, capillary Status: Abnormal   Collection Time: 06/23/14 12:52 PM  Result Value Ref Range   Glucose-Capillary 178 (H) 70 - 99 mg/dL   Comment 1 Notify RN    Comment 2 Documented in Chart   Glucose, capillary Status: Abnormal   Collection Time: 06/23/14 5:48 PM  Result Value Ref Range   Glucose-Capillary 160 (H) 70 - 99 mg/dL  Glucose, capillary Status: Abnormal   Collection Time: 06/23/14 9:58 PM  Result Value Ref Range   Glucose-Capillary 131 (H) 70 - 99 mg/dL  CBC Status: Abnormal   Collection Time: 06/24/14 4:13 AM  Result Value Ref Range   WBC 5.6 4.0 - 10.5 K/uL   RBC 2.84 (L) 3.87 - 5.11 MIL/uL   Hemoglobin 8.7 (L) 12.0 - 15.0 g/dL   HCT 26.2 (L) 36.0 - 46.0 %   MCV 92.3 78.0 - 100.0 fL   MCH 30.6 26.0 - 34.0 pg   MCHC 33.2 30.0 - 36.0 g/dL   RDW 14.0 11.5 - 15.5 %   Platelets 203 150 - 400 K/uL  Basic metabolic panel Status: Abnormal   Collection Time: 06/24/14 4:13 AM  Result Value Ref Range   Sodium 135 (L) 137 - 147 mEq/L   Potassium 4.1 3.7 - 5.3 mEq/L   Chloride 97 96 - 112 mEq/L   CO2 26 19 - 32 mEq/L   Glucose, Bld 166 (H) 70 - 99 mg/dL   BUN 8 6 - 23 mg/dL   Creatinine, Ser 0.62 0.50 - 1.10 mg/dL   Calcium 8.6 8.4 - 10.5 mg/dL   GFR calc non Af Amer >90 >90 mL/min   GFR calc Af Amer >90 >90 mL/min    Comment: (NOTE) The eGFR has been  calculated using the CKD EPI equation. This calculation has not been validated in all clinical situations. eGFR's persistently <90 mL/min signify possible Chronic Kidney Disease.    Anion gap 12 5 - 15  Vitamin B12 Status: Abnormal   Collection Time: 06/24/14 4:13 AM  Result Value Ref Range   Vitamin B-12 1120 (H) 211 - 911 pg/mL    Comment: Performed at [facility]  Folate Status: None   Collection Time: 06/24/14 4:13 AM  Result Value Ref Range   Folate 7.4 ng/mL    Comment: (NOTE) Reference Ranges  Deficient: 0.4 - 3.3 ng/mL  Indeterminate: 3.4 - 5.4 ng/mL  Normal: > 5.4 ng/mL Performed at [facility]   Ferritin Status: None   Collection Time: 06/24/14 4:13 AM  Result Value Ref Range   Ferritin 199 10 - 291 ng/mL    Comment: Performed at [place] Status: Abnormal   Collection Time: 06/24/14 4:13 AM  Result Value Ref Range   Retic Ct Pct 2.5 0.4 - 3.1 %   RBC. 2.84 (L) 3.87 - 5.11 MIL/uL   Retic Count, Manual 71.0 19.0 - 186.0 K/uL  Glucose, capillary Status: Abnormal   Collection Time: 06/24/14 7:15 AM  Result Value Ref Range   Glucose-Capillary 140 (H) 70 - 99 mg/dL      Imaging Results (Last 48 hours)    No results found.       Medical Problem List  and Plan: 1. Functional deficits secondary to R-TKR complicated by VDRF, fluid overload and a fib 2. DVT Prophylaxis/Anticoagulation: Pharmaceutical: Other (comment)--Eliquis 3. Pain Management: Will continue oxycodone prn with ice to help with edema.  4. Generalized anxiety disorder/Mood: On paxil daily with xanax prn. LCSW to follow for evaluation and support.  5. Neuropsych: This patient is capable of making decisions on her own behalf. 6. Skin/Wound Care:   Routine pressure relief measures. Add nutritional supplement to help with  healing.              -likely cellulitis: begin empiric cipro             -check cbc tomorrow 7. Fluids/Electrolytes/Nutrition: Monitor I/O and watch for recurrent signs of overload.  8. COPD: Respiratory status stable on dulera bid and Singulair daily.  9. HTN: Will monitor every 8 hours. Off Lasix at this time.  10. A fib with RVR: Will monitor with checks every 8 hours. On amiodarone 200 mg daily now. 11. Demand ischemia of myocardium with Takotsubo syndrome: Will monitor weight daily. Low salt diet.  12. DM type 2: Monitor BS with ac/hs cbg checks. Was on metformin once a day at home. Will d/c lantus and titrate as needed. Use SSI for elevated BS.  13. ABLA: Add iron supplement.   Post Admission Physician Evaluation: 1. Functional deficits secondary to right knee OA requiring right TKA, complicated by VDRF/fluid overload/afib. 2. Patient is admitted to receive collaborative, interdisciplinary care between the physiatrist, rehab nursing staff, and therapy team. 3. Patient's level of medical complexity and substantial therapy needs in context of that medical necessity cannot be provided at a lesser intensity of care such as a SNF. 4. Patient has experienced substantial functional loss from his/her baseline which was documented above under the "Functional History" and "Functional Status" headings. Judging by the patient's diagnosis, physical exam, and functional history, the patient has potential for functional progress which will result in measurable gains while on inpatient rehab. These gains will be of substantial and practical use upon discharge in facilitating mobility and self-care at the household level. 5. Physiatrist will provide 24 hour management of medical needs as well as oversight of the therapy plan/treatment and provide guidance as appropriate regarding the interaction of the two. 6. 24 hour rehab nursing will assist with bladder management, bowel management, safety,  skin/wound care, disease management, medication administration, pain management and patient education and help integrate therapy concepts, techniques,education, etc. 7. PT will assess and treat for/with: Lower extremity strength, range of motion, stamina, balance, functional mobility, safety, adaptive techniques and equipment, knee ROM, pain relief, pacing, education for patient, wound care. Goals are: mod I. 8. OT will assess and treat for/with: ADL's, functional mobility, safety, upper extremity strength, adaptive techniques and equipment, pain mgt, wound observation, community reintegration, leisure awareness. Goals are: mod I. Therapy may not yet proceed with showering this patient. 9. SLP will assess and treat for/with: n/a. Goals are: n/a. 10. Case Management and Social Worker will assess and treat for psychological issues and discharge planning. 11. Team conference will be held weekly to assess progress toward goals and to determine barriers to discharge. 12. Patient will receive at least 3 hours of therapy per day at least 5 days per week. 13. ELOS: 10-14 days  14. Prognosis: excellent     [name], MD, [facility] 06/24/2014

## 2014-06-24 NOTE — Progress Notes (Signed)
Physical Therapy Treatment Patient Details Name: [name] [name] MRN: [id number] DOB: 24-Jan-1951 Today's Date: 06/24/2014    History of Present Illness 63 yo female adm 06/19/14 for R TKA; Pt  developed pulmonary edema during surgery and so it was decided to leave pt on vent,  pt also with afib with RVR, elevated troponin (felt to be demand ishcemia per Cards), was  extubated 06/20/14; PMHx:  HTN, MI, COPD, L TKA, shoulder surgery;   Pt is POD #2 at time of this PT eval;, rate controlled and back in NSR    PT Comments    PM session.  Assisted OOB to amb to BR then in hallway.  Assisted back to bed and applied ICE.  Follow Up Recommendations  CIR     Equipment Recommendations       Recommendations for Other Services       Precautions / Restrictions Precautions Precautions: Fall Precaution Comments: monitor HR/Sats Knee Immobilizer - Right: Discontinue once straight leg raise with < 10 degree lag (pt able to perform active SLR) Restrictions Weight Bearing Restrictions: No Other Position/Activity Restrictions: WBAT    Mobility  Bed Mobility Overal bed mobility: Needs Assistance Bed Mobility: Supine to Sit     Supine to sit: Min guard     General bed mobility comments: incr time, pt motivated to self assist; min/guard for safety, impulsive  Transfers Overall transfer level: Needs assistance Equipment used: Rolling walker (2 wheeled) Transfers: Sit to/from Stand Sit to Stand: Min assist;From elevated surface         General transfer comment: verbal cues for hand placement, RW safety  Ambulation/Gait Ambulation/Gait assistance: Min assist;Mod assist Ambulation Distance (Feet): 32 Feet Assistive device: Rolling walker (2 wheeled) Gait Pattern/deviations: Step-to pattern Gait velocity: decreased   General Gait Details: HR increased to 150's .  Amb distance shortened and VC's deep breathing/relaxation tech.   Stairs            Wheelchair Mobility     Modified Rankin (Stroke Patients Only)       Balance                                    Cognition                            Exercises      General Comments        Pertinent Vitals/Pain Pain Assessment: 0-10 Pain Score: 4  Pain Location: R knee Pain Descriptors / Indicators: Aching;Constant;Sore Pain Intervention(s): Monitored during session;Premedicated before session;Ice applied;Repositioned    Home Living     Available Help at Discharge: Family;Available 24 hours/day                Prior Function            PT Goals (current goals can now be found in the care plan section) Progress towards PT goals: Progressing toward goals    Frequency  7X/week    PT Plan      Co-evaluation             End of Session Equipment Utilized During Treatment: Gait belt Activity Tolerance: Patient limited by fatigue Patient left: in bed;with call bell/phone within reach;with chair alarm set     Time: 7616-0737 PT Time Calculation (min) (ACUTE ONLY): 28 min  Charges:  $Gait Training: 8-22 mins $Therapeutic Activity: 8-22  mins                    G Codes:      [name] [name]  PTA [facility]  Acute  Rehab Pager      [phone]

## 2014-06-24 NOTE — Progress Notes (Signed)
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 Referring Physician: Dr. Maryland Pink   HPI: [name] [name] is a 63 y.o. 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 Post op course complicated by VDRF due to flash pulmonary edema as well as A fib with RVR. She was started on IV amiodarone and converted into NSR. 2D echo with EF 15-20 5 with diffuse hypokinesis. She was extubated on 11/20 and Dr. Ron Parker consulted for input and felt that edema was multifactorial and elevated troponin due to demand ischemia from acute pulmonary edema and A fib and felt that echo diagnostic of stress cardiomyopathy. She had recurrent A fib on 11/20 and was started on eliquis. Repeat Echo today with improvement in EF 55% and normal LV thickness/function. Therapy ongoing and patient continues to be limited by poor DOE as well as pain. CIR recommended for follow up therapy.    Review of Systems  HENT: Negative for hearing loss.  Eyes: Negative for blurred vision and double vision.  Respiratory: Positive for shortness of breath (with activity). Negative for wheezing.  Cardiovascular: Negative for chest pain and palpitations.  Gastrointestinal: Positive for constipation (No BM since surgery). Negative for heartburn and nausea.  Genitourinary: Negative for urgency and frequency.  Musculoskeletal: Positive for myalgias and falls (for past 3 months).  Neurological: Positive for sensory change (RLE numbness intermittently with give away). Negative for dizziness and headaches.    Psychiatric/Behavioral: The patient does not have insomnia.      Past Medical History  Diagnosis Date  . Insomnia, unspecified   . Esophageal reflux   . Hyperlipemia   . Asthma   . Osteoporosis   . Arthritis   . COPD (chronic obstructive pulmonary disease)   . Depression   . Personal history of colonic polyps 09/2010    TUBULAR ADENOMAS (X3); NEGATIVE FOR HIGH GRADE DYSPLASIA OR MALIGNANCY.  . Barrett esophagus   . Hiatal hernia   . Esophageal stricture   . GERD (gastroesophageal reflux disease)   . GAD (generalized anxiety disorder)   . Aortic valve disorders   . Heart murmur   . Anxiety   . Shortness of breath     with exertion   . Pneumonia     hx of   . Type II or unspecified type diabetes mellitus without mention of complication, not stated as uncontrolled     on no meds   . Anemia     hx of years ago   . Hypertension 12/07/2012  . Myocardial infarction 01-08-13    '06-Chest pain-no stent-dx. MI-stress related  . History of kidney stones 01-08-13    past hx.  . Transfusion history 01-08-13    past hx. many yrs ago  . Dysrhythmia     heart skips per pt   . 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 Urinary frequency     AND INCONTINENCE    Past Surgical History  Procedure Laterality Date  . Cholecystectomy  2005  . Partial hysterectomy    . Shoulder  surgery  2011  . Tubal ligation    . Tonsillectomy    . Foot surgery  2005    Left foot  . Wrist flexion tendon tenotomies and proximal corpectomy w/ wrist arthrodesis&iliac crest bone graft    . Cardiac catheterization      normal per Dr Acie Fredrickson in note dated 02/06/12   . Knee arthroscopy  02/28/2012    Procedure: ARTHROSCOPY KNEE; Surgeon: Tobi Bastos, MD; Location: [facility]; Service: Orthopedics; Laterality: Left;   . Esophageal dilation  01-08-13    2 yrs ago  . Cataract extraction, bilateral    . Total knee arthroplasty Left 01/17/2013    Procedure: LEFT TOTAL KNEE ARTHROPLASTY; Surgeon: Tobi Bastos, MD; Location: [facility]; Service: Orthopedics; Laterality: Left;  . Total knee arthroplasty Right 06/19/2014    Procedure: RIGHT TOTAL KNEE ARTHROPLASTY; Surgeon: Tobi Bastos, MD; Location: [facility]; Service: Orthopedics; Laterality: Right;    Family History  Problem Relation Age of Onset  . Breast cancer Mother   . Diabetes Maternal Grandfather   . Kidney disease      Both sides of family  . Heart disease      Both sides of family  . Colon cancer Brother 29  . Colon polyps Brother   . Breast cancer Maternal Aunt   . Breast cancer Maternal Grandmother     Social History: Lives alone. Disabled since '85 due to stroke? Independent with AD. She reports that she quit smoking 2 months ago. Her smoking use included Cigarettes. She has a 10 pack-year smoking history. She quit smokeless tobacco use about 2 years ago. Her smokeless tobacco use included Snuff and Chew. She reports that she drinks about 0.6 oz of alcohol per week. She reports that she does not use illicit drugs.    Allergies  Allergen Reactions  . Other Anaphylaxis    BEE STINGS- CLOSE OFF THROAT  . Penicillins Anaphylaxis    "CLOSES OFF MY BREATHING"  . Pneumococcal Vaccines Other (See Comments)    PP-23 vaccine; had BIG local red reaction with heat.   Jaclyn Prime [Ibandronate Sodium]     Jaw popping  . Ceclor [Cefaclor] Other (See Comments)    Reaction=burning all over  . Codeine Nausea And Vomiting  . Cortisone Hives    All over body    Medications Prior to Admission  Medication Sig Dispense Refill  . albuterol (PROVENTIL HFA;VENTOLIN HFA) 108 (90 BASE) MCG/ACT inhaler Inhale 2 puffs into the  lungs every 4 (four) hours as needed for wheezing or shortness of breath. Take two puffs four times a day as needed 1 Inhaler prn  . albuterol (PROVENTIL) (2.5 MG/3ML) 0.083% nebulizer solution Take 3 mLs (2.5 mg total) by nebulization every 6 (six) hours as needed for wheezing. DX 491.9 150 mL 12  . ALPRAZolam (XANAX) 0.5 MG tablet Take 0.5 mg by mouth 3 (three) times daily.    [name] aspirin EC 81 MG tablet Take 81 mg by mouth every morning.     [name] atorvastatin (LIPITOR) 40 MG tablet Take 1 tablet (40 mg total) by mouth at bedtime. 30 tablet 0  . benzonatate (TESSALON) 200 MG capsule Take 1 capsule (200 mg total) by mouth 3 (three) times daily as needed for cough. 30 capsule 2  . calcium carbonate (OS-CAL) 600 MG TABS Take 600 mg by mouth at bedtime.     . Cholecalciferol (VITAMIN D) 1000 UNITS capsule Take 1,000 Units by mouth at bedtime.     [name]  dexlansoprazole (DEXILANT) 60 MG capsule Take 1 capsule (60 mg total) by mouth every morning. 30 capsule 5  . docusate sodium (COLACE) 100 MG capsule Take 100 mg by mouth 2 (two) times daily.    . ferrous sulfate 325 (65 FE) MG tablet Take 325 mg by mouth 2 (two) times daily.     . fluticasone (FLONASE) 50 MCG/ACT nasal spray Place 2 sprays into the nose daily. (Patient taking differently: Place 1 spray into the nose 2 (two) times daily. ) 16 g prn  . furosemide (LASIX) 40 MG tablet Take 40 mg by mouth every morning.    . Menthol (HONEY LEMON COUGH DROPS MT) Use as directed in the mouth or throat. Taking approx. 8-9 daily    . metFORMIN (GLUCOPHAGE) 500 MG tablet Take 1 tablet (500 mg total) by mouth daily. Takes at bedtime. (Patient taking differently: Take 500 mg by mouth 2 (two) times daily. ) 30 tablet 0  . mometasone-formoterol (DULERA) 100-5 MCG/ACT AERO 2 puffs then rinse mouth twice daily maintenance controller (Patient taking differently: Inhale 1 puff into the lungs 2 (two) times  daily. ) 1 Inhaler prn  . montelukast (SINGULAIR) 10 MG tablet Take 1 tablet (10 mg total) by mouth at bedtime. 30 tablet 11  . PARoxetine (PAXIL) 20 MG tablet Take 20 mg by mouth at bedtime.     . vitamin B-12 (CYANOCOBALAMIN) 1000 MCG tablet Take 1,000 mcg by mouth at bedtime.     . CHANTIX STARTING MONTH PAK 0.5 MG X 11 & 1 MG X 42 tablet TAKE AS DIRECTED 53 tablet 0  . EPINEPHrine 0.3 mg/0.3 mL IJ SOAJ injection Inject 0.3 mLs (0.3 mg total) into the muscle once. (Patient taking differently: Inject 0.3 mg into the muscle once as needed. EPI PEN - PT ALLERGIC TO BEES) 1 Device 1    Home: Home Living Family/patient expects to be discharged to:: Private residence Living Arrangements: Alone Type of Home: House Home Access: Stairs to enter Technical brewer of Steps: 2 Home Layout: One level Home Equipment: Environmental consultant - 2 wheels, Cane - single point, Bedside commode Additional Comments: pt will be staying with her mother and brother  Functional History: Prior Function Level of Independence: Independent, Independent with assistive device(s) Functional Status:  Mobility: Bed Mobility Overal bed mobility: Needs Assistance Bed Mobility: Supine to Sit Supine to sit: Min guard General bed mobility comments: incr time, pt motivated to self assist; min/guard for safety, impulsive Transfers Overall transfer level: Needs assistance Equipment used: Rolling walker (2 wheeled) Transfers: Sit to/from Stand Sit to Stand: Min assist, From elevated surface Stand pivot transfers: Min assist General transfer comment: verbal cues for hand placement, RW safety Ambulation/Gait Ambulation/Gait assistance: Mod assist Ambulation Distance (Feet): 20 Feet (then 10') Assistive device: Rolling walker (2 wheeled) Gait Pattern/deviations: Step-to pattern, Decreased step length - right, Decreased stance time - right, Antalgic General Gait Details: cues to incr WBing on RLE, use  of UEs for pain control, sequence    ADL: ADL Overall ADL's : Needs assistance/impaired Eating/Feeding: Set up, Sitting Grooming: Sitting, Wash/dry face Upper Body Bathing: Sitting, Set up Lower Body Bathing: Sit to/from stand, Moderate assistance Upper Body Dressing : Set up, Sitting Lower Body Dressing: Sit to/from stand, Maximal assistance Toilet Transfer: Moderate assistance, RW Toileting- Clothing Manipulation and Hygiene: Moderate assistance, Sit to/from stand General ADL Comments: Pt very agreeable - but in pain. RN called as knee was very red. RN marked area around knee that was reddened  Cognition:  Cognition Overall Cognitive Status: Within Functional Limits for tasks assessed Orientation Level: Oriented X4 Cognition Arousal/Alertness: Awake/alert Behavior During Therapy: WFL for tasks assessed/performed Overall Cognitive Status: Within Functional Limits for tasks assessed Memory: Decreased short-term memory (pt does not recall having PT yesterday)  Blood pressure 142/63, pulse 70, temperature 97.8 F (36.6 C), temperature source Oral, resp. rate 20, height 5' 0.5" (1.537 m), weight 85.7 kg (188 lb 15 oz), SpO2 98 %. Physical Exam  Nursing note and vitals reviewed. Constitutional: She is oriented to person, place, and time. She appears well-developed and well-nourished.  HENT:  Head: Atraumatic.  Eyes: Conjunctivae are normal. Pupils are equal, round, and reactive to light.  Neck: Normal range of motion. Neck supple.  Cardiovascular: Normal rate and regular rhythm.  Respiratory: Effort normal and breath sounds normal. No respiratory distress. She has no wheezes.  GI: Soft. Bowel sounds are normal. She exhibits no distension. There is no tenderness.  Musculoskeletal:  Right knee with surgical dressing in place--hot to touch, tender, edematous and has diffuse erythema surrounding medial aspect. LLE with well healed old TKR incision.  Neurological: She is alert and  oriented to person, place, and time.  Skin: Skin is warm and dry.     Lab Results Last 24 Hours    Results for orders placed or performed during the hospital encounter of 06/19/14 (from the past 24 hour(s))  Glucose, capillary Status: Abnormal   Collection Time: 06/22/14 4:32 PM  Result Value Ref Range   Glucose-Capillary 146 (H) 70 - 99 mg/dL  Glucose, capillary Status: Abnormal   Collection Time: 06/22/14 9:48 PM  Result Value Ref Range   Glucose-Capillary 210 (H) 70 - 99 mg/dL  Basic metabolic panel Status: Abnormal   Collection Time: 06/23/14 4:30 AM  Result Value Ref Range   Sodium 133 (L) 137 - 147 mEq/L   Potassium 4.5 3.7 - 5.3 mEq/L   Chloride 97 96 - 112 mEq/L   CO2 25 19 - 32 mEq/L   Glucose, Bld 161 (H) 70 - 99 mg/dL   BUN 8 6 - 23 mg/dL   Creatinine, Ser 0.61 0.50 - 1.10 mg/dL   Calcium 8.6 8.4 - 10.5 mg/dL   GFR calc non Af Amer >90 >90 mL/min   GFR calc Af Amer >90 >90 mL/min   Anion gap 11 5 - 15  Magnesium Status: None   Collection Time: 06/23/14 4:30 AM  Result Value Ref Range   Magnesium 2.1 1.5 - 2.5 mg/dL  CBC Status: Abnormal   Collection Time: 06/23/14 4:30 AM  Result Value Ref Range   WBC 5.5 4.0 - 10.5 K/uL   RBC 2.86 (L) 3.87 - 5.11 MIL/uL   Hemoglobin 8.9 (L) 12.0 - 15.0 g/dL   HCT 25.8 (L) 36.0 - 46.0 %   MCV 90.2 78.0 - 100.0 fL   MCH 31.1 26.0 - 34.0 pg   MCHC 34.5 30.0 - 36.0 g/dL   RDW 13.7 11.5 - 15.5 %   Platelets 177 150 - 400 K/uL  Glucose, capillary Status: Abnormal   Collection Time: 06/23/14 8:11 AM  Result Value Ref Range   Glucose-Capillary 149 (H) 70 - 99 mg/dL  Glucose, capillary Status: Abnormal   Collection Time: 06/23/14 12:52 PM  Result Value Ref Range   Glucose-Capillary 178 (H) 70 - 99 mg/dL   Comment 1 Notify RN    Comment 2  Documented in Chart       Imaging Results (Last 48 hours)    No  results found.    Assessment/Plan: Diagnosis: right tka with post op respiratory failure/cardiac complications 1. Does the need for close, 24 hr/day medical supervision in concert with the patient's rehab needs make it unreasonable for this patient to be served in a less intensive setting? Yes 2. Co-Morbidities requiring supervision/potential complications: afib, dm2, cm 3. Due to bladder management, bowel management, safety, skin/wound care, disease management, medication administration, pain management and patient education, does the patient require 24 hr/day rehab nursing? Yes 4. Does the patient require coordinated care of a physician, rehab nurse, PT (1-2 hrs/day, 5 days/week) and OT (1-2 hrs/day, 5 days/week) to address physical and functional deficits in the context of the above medical diagnosis(es)? Yes Addressing deficits in the following areas: balance, endurance, locomotion, strength, transferring, bowel/bladder control, bathing, dressing, feeding, grooming, toileting and psychosocial support 5. Can the patient actively participate in an intensive therapy program of at least 3 hrs of therapy per day at least 5 days per week? Yes 6. The potential for patient to make measurable gains while on inpatient rehab is excellent 7. Anticipated functional outcomes upon discharge from inpatient rehab are modified independent with PT, modified independent with OT, n/a with SLP. 8. Estimated rehab length of stay to reach the above functional goals is: 10-14 days 9. Does the patient have adequate social supports and living environment to accommodate these discharge functional goals? Yes 10. Anticipated D/C setting: Home 11. Anticipated post D/C treatments: HH therapy and Outpatient therapy 12. Overall Rehab/Functional Prognosis: excellent  RECOMMENDATIONS: This patient's condition is appropriate for continued rehabilitative care  in the following setting: CIR Patient has agreed to participate in recommended program. Yes and Potentially Note that insurance prior authorization may be required for reimbursement for recommended care.  Comment: Rehab Admissions Coordinator to follow up.  Thanks,  [name] Staggers, MD, Mellody Drown     06/23/2014

## 2014-06-24 NOTE — Plan of Care (Signed)
Problem: RH BOWEL ELIMINATION Goal: RH STG MANAGE BOWEL W/MEDICATION W/ASSISTANCE STG Manage Bowel with Medication with Assistance.  Outcome: Progressing  Problem: RH SKIN INTEGRITY Goal: RH STG SKIN FREE OF INFECTION/BREAKDOWN Outcome: Progressing Goal: RH STG MAINTAIN SKIN INTEGRITY WITH ASSISTANCE STG Maintain Skin Integrity With Assistance.  Outcome: Progressing Goal: RH STG ABLE TO PERFORM INCISION/WOUND CARE W/ASSISTANCE STG Able To Perform Incision/Wound Care With Assistance.  Outcome: Progressing  Problem: RH SAFETY Goal: RH STG ADHERE TO SAFETY PRECAUTIONS W/ASSISTANCE/DEVICE STG Adhere to Safety Precautions With Assistance/Device.  Outcome: Progressing Goal: RH STG DECREASED RISK OF FALL WITH ASSISTANCE STG Decreased Risk of Fall With Assistance.  Outcome: Progressing  Problem: RH COGNITION-NURSING Goal: RH STG USES MEMORY AIDS/STRATEGIES W/ASSIST TO PROBLEM SOLVE STG Uses Memory Aids/Strategies With Assistance to Problem Solve.  Outcome: Progressing  Problem: RH PAIN MANAGEMENT Goal: RH STG PAIN MANAGED AT OR BELOW PT'S PAIN GOAL Outcome: Progressing

## 2014-06-24 NOTE — Progress Notes (Signed)
Inpatient Rehabilitation  I met with the patient at the bedside to discuss her post acute rehab options.  She indicated she is interested in coming to IP rheab but wanted me to discuss with her mother Alonza Smoker and her brother Jenny Reichmann.  I phoned Alonza Smoker and Jenny Reichmann and discussed with each of them.  They are supportive of this plan.  I spoke with Dr. Maryland Pink and he is in agreement and will write Dc orders.  Pt.'s RN Rosezella [place] is aware.  I will notify case manager.  Please call if questions.  [place] [facility] Admissions Coordinator Cell [phone] Office [phone]

## 2014-06-24 NOTE — Progress Notes (Addendum)
Physical Therapy Treatment Patient Details Name: [name] [name] MRN: [id number] DOB: 05/31/1951 Today's Date: 06/24/2014    History of Present Illness 63 yo female adm 06/19/14 for R TKA; Pt  developed pulmonary edema during surgery and so it was decided to leave pt on vent,  pt also with afib with RVR, elevated troponin (felt to be demand ishcemia per Cards), was  extubated 06/20/14; PMHx:  HTN, MI, COPD, L TKA, shoulder surgery;   Pt is POD #2 at time of this PT eval;, rate controlled and back in NSR    PT Comments    POD # 5  Post op Pulmonary Edema am session.  D/C KI as pt is able to perform active SLR.  Assisted OOB to anb to BR then in hallway.  Performed TKR TE's followed by ICE.    Follow Up Recommendations  CIR     Equipment Recommendations       Recommendations for Other Services       Precautions / Restrictions Precautions Precautions: Fall Precaution Comments: monitor HR/Sats Knee Immobilizer - Right: Discontinue once straight leg raise with < 10 degree lag (pt able to perform active SLR) Restrictions Weight Bearing Restrictions: No Other Position/Activity Restrictions: WBAT    Mobility  Bed Mobility Overal bed mobility: Needs Assistance Bed Mobility: Supine to Sit     Supine to sit: Min guard     General bed mobility comments: incr time, pt motivated to self assist; min/guard for safety, impulsive  Transfers Overall transfer level: Needs assistance Equipment used: Rolling walker (2 wheeled) Transfers: Sit to/from Stand Sit to Stand: Min assist;From elevated surface         General transfer comment: verbal cues for hand placement, RW safety  Ambulation/Gait Ambulation/Gait assistance: Min assist;Mod assist Ambulation Distance (Feet): 28 Feet Assistive device: Rolling walker (2 wheeled) Gait Pattern/deviations: Step-to pattern Gait velocity: decreased   General Gait Details: HR increased to 150's .  Amb distance shortened and VC's deep  breathing/relaxation tech.   Stairs            Wheelchair Mobility    Modified Rankin (Stroke Patients Only)       Balance                                    Cognition                            Exercises   Total Knee Replacement TE's 10 reps B LE ankle pumps 10 reps towel squeezes 10 reps knee presses 10 reps heel slides  10 reps SAQ's 10 reps SLR's 10 reps ABD Followed by ICE     General Comments        Pertinent Vitals/Pain Pain Assessment: 0-10 Pain Score: 4  Pain Location: R knee Pain Descriptors / Indicators: Aching;Constant;Sore Pain Intervention(s): Monitored during session;Premedicated before session;Ice applied;Repositioned    Home Living     Available Help at Discharge: Family;Available 24 hours/day                Prior Function            PT Goals (current goals can now be found in the care plan section) Progress towards PT goals: Progressing toward goals    Frequency  7X/week    PT Plan      Co-evaluation  End of Session Equipment Utilized During Treatment: Gait belt Activity Tolerance: Patient limited by fatigue Patient left: in chair;with call bell/phone within reach;with chair alarm set     Time: [phone] PT Time Calculation (min) (ACUTE ONLY): 41 min  Charges:  $Gait Training: 8-22 mins $Therapeutic Exercise: 8-22 mins $Therapeutic Activity: 8-22 mins                    G Codes:      Rica Koyanagi  PTA [facility]  Acute  Rehab Pager      [phone]

## 2014-06-24 NOTE — Discharge Instructions (Signed)

## 2014-06-24 NOTE — Progress Notes (Signed)
Patient Name: [name] [name] Date of Encounter: 06/24/2014  Principal Problem:   Acute systolic heart failure Active Problems:   DM type 2 (diabetes mellitus, type 2)   Total knee replacement status   Respiratory failure requiring intubation   Acute pulmonary edema   Acute respiratory failure with hypoxia   Hypoxemia   Arterial hypotension   Elevated troponin   Atrial fibrillation with RVR   Congestive dilated cardiomyopathy   Demand ischemia of myocardium   Takotsubo syndrome   Length of Stay: 5  SUBJECTIVE   Feeling well. Being evaluated for rehab. Maintaining NSR. No cardiac complaints. Echo shows normal LVEF with near complete resolution of wall motion abnormality.  CURRENT MEDS . amiodarone  400 mg Oral Daily  . antiseptic oral rinse  7 mL Mouth Rinse QID  . apixaban  5 mg Oral BID  . aspirin EC  81 mg Oral Daily  . atorvastatin  40 mg Oral QHS  . chlorhexidine  15 mL Mouth Rinse BID  . docusate sodium  100 mg Oral BID  . fluticasone  2 spray Each Nare Daily  . insulin aspart  0-15 Units Subcutaneous TID WC  . insulin glargine  10 Units Subcutaneous Daily  . mometasone-formoterol  1 puff Inhalation BID  . montelukast  10 mg Oral QHS  . pantoprazole  40 mg Oral Daily  . PARoxetine  20 mg Oral QHS  . polyethylene glycol  17 g Oral Daily    OBJECTIVE   Intake/Output Summary (Last 24 hours) at 06/24/14 0843 Last data filed at 06/24/14 0831  Gross per 24 hour  Intake    740 ml  Output   1600 ml  Net   -860 ml   Filed Weights   06/23/14 0500 06/24/14 0500 06/24/14 0534  Weight: 188 lb 15 oz (85.7 kg) 181 lb 11.2 oz (82.419 kg) 185 lb 3 oz (84 kg)    PHYSICAL EXAM Filed Vitals:   06/23/14 2321 06/24/14 0338 06/24/14 0500 06/24/14 0534  BP:    128/60  Pulse:    70  Temp:    98.2 F (36.8 C)  TempSrc:    Oral  Resp: 20 18  20   Height:      Weight:   181 lb 11.2 oz (82.419 kg) 185 lb 3 oz (84 kg)  SpO2:    96%   General: Alert, oriented x3, no  distress Head: no evidence of trauma, PERRL, EOMI, no exophtalmos or lid lag, no myxedema, no xanthelasma; normal ears, nose and oropharynx Neck: normal jugular venous pulsations and no hepatojugular reflux; brisk carotid pulses without delay and no carotid bruits Chest: clear to auscultation, no signs of consolidation by percussion or palpation, normal fremitus, symmetrical and full respiratory excursions Cardiovascular: normal position and quality of the apical impulse, regular rhythm, normal first and second heart sounds, no rubs or gallops, no murmur Abdomen: no tenderness or distention, no masses by palpation, no abnormal pulsatility or arterial bruits, normal bowel sounds, no hepatosplenomegaly Extremities: no clubbing, cyanosis or edema; 2+ radial, ulnar and brachial pulses bilaterally; 2+ right femoral, posterior tibial and dorsalis pedis pulses; 2+ left femoral, posterior tibial and dorsalis pedis pulses; no subclavian or femoral bruits. R knee surgical site looks great without bleeding or swelling Neurological: grossly nonfocal  LABS  CBC  Recent Labs  06/23/14 0430 06/24/14 0413  WBC 5.5 5.6  HGB 8.9* 8.7*  HCT 25.8* 26.2*  MCV 90.2 92.3  PLT 177 211   Basic Metabolic  Panel  Recent Labs  06/22/14 0409 06/23/14 0430 06/24/14 0413  NA 128* 133* 135*  K 3.3* 4.5 4.1  CL 91* 97 97  CO2 24 25 26   GLUCOSE 137* 161* 166*  BUN 15 8 8   CREATININE 0.71 0.61 0.62  CALCIUM 7.3* 8.6 8.6  MG 1.4* 2.1  --    Recent Labs  06/22/14 0939  TSH 3.120    Radiology Studies Imaging results have been reviewed and No results found.  TELE NSR  ECHO - Left ventricle: THERE IS MARKED IMPROVEMENT IN LV FUNCTION SINCE 06/20/2014. Maybe slight relative apical hypokinesis. The cavity size was normal. Wall thickness was normal. The estimated ejection fraction was 55%.  ECG NSR, persistent repol changes, QTc down to 465 ms  ASSESSMENT AND PLAN  Clinical progress highly  consistent with Takotsubo sd. Rhythm normal and BP better, no CHF.  Improvement was so fast I think we will accelerate the reduction in amio dose: reduce to 200 mg. Plan to stop in 30 days if no arrhythmia and myocardial dysfunction resolves. Continue Eliquis for several months until we are certain arrhythmia was exclusively related to the acute illness. If echo today shows improving LV function, plan Lexiscan Myoview (normal coronaries by cath 2006) which can be done as an outpatient.   Sanda Klein, MD, [facility] [phone] office [phone] pager 06/24/2014 8:43 AM

## 2014-06-24 NOTE — Discharge Summary (Signed)
[facility]  Physician Discharge Summary   Patient ID: ELERI RUBEN MRN: [id number] DOB/AGE: 02-14-1951 63 y.o.  Admit date: 06/19/2014 Discharge date: 06/24/2014  PCP: Velna Hatchet, MD  DISCHARGE DIAGNOSES:  Principal Problem:   Acute systolic heart failure Active Problems:   DM type 2 (diabetes mellitus, type 2)   Total knee replacement status   Respiratory failure requiring intubation   Acute pulmonary edema   Acute respiratory failure with hypoxia   Hypoxemia   Arterial hypotension   Elevated troponin   Atrial fibrillation with RVR   Congestive dilated cardiomyopathy   Demand ischemia of myocardium   Takotsubo syndrome   RECOMMENDATIONS FOR OUTPATIENT FOLLOW UP: 1. Patient being discharged to inpatient rehabilitation  DISCHARGE CONDITION: fair  Diet recommendation: Modified carbohydrate.  Filed Weights   06/23/14 0500 06/24/14 0500 06/24/14 0534  Weight: 85.7 kg (188 lb 15 oz) 82.419 kg (181 lb 11.2 oz) 84 kg (185 lb 3 oz)    INITIAL HISTORY: Patient is a 63 year old Caucasian female who underwent right knee arthroplasty on November 19. Postoperatively, she developed pulmonary edema. She had to be intubated. She was given Lasix. She was in the ICU. Subsequently, she improved. She was extubated and was transferred to the hospitalist care. Echocardiogram revealed EF of 15%. Cardiology was consulted.  Consultants: Cardiology  Procedures:  2D ECHO 11/20 Study Conclusions - Left ventricle: The cavity size was normal. Wall thickness wasnormal. Systolic function was severely reduced. The estimatedejection fraction was in the range of 15% to 20%. Diffusehypokinesis with mild sparing of the basilar wall segments. Noevidence of thrombus.  2D ECHO 11/23 Study Conclusions - Left ventricle: THERE IS MARKED IMPROVEMENT IN LV FUNCTION SINCE11/20/2015. Maybe slight relative apical hypokinesis. The cavity size was normal. Wall thickness was normal.  The estimatedejection fraction was 55%. - Right ventricle: The cavity size was normal. Systolic functionwas normal.  SIGNIFICANT EVENTS: 11/19 intubated to ICU 11/20 A fib with RVR, troponin elevation >> cardiology consulted 11/21 Converted to sinus rhythm  HOSPITAL COURSE:   Acute hypoxic respiratory failure 2nd to acute pulmonary edema  She had to be intubated as she was hypoxic. She was diuresed. She was subsequently extubated on November 20. She has been doing well just on oxygen by nasal cannula.  Acute systolic congestive heart failure/?Takotsubo cardiomyopathy/Troponin elevation/Hx of CAD, HLD. She was found to have elevated troponins. Her echocardiogram showed EF of 15%. She was thought to have Takotsubo cardiomyopathy. Echocardiogram was repeated and has shown significantly improved EF up to 55%. So, this somewhat confirms the diagnosis. She does not require any diuretics at this time as the patient is urinating quite well. This may be considered at the time of eventual discharge from rehab. This was discussed with cardiology. She does not need a coronary angiogram currently. She apparently had one in 2006. She will need an outpatient stress test, which cardiology will arrange.   A fib with RVR 11/20. She had transient atrial fibrillation while she was in respiratory distress. She was initially placed on amiodarone infusion. She has been transitioned to oral amiodarone. Cardiology will most likely discontinue in a month if she remains stable without any recurrence. And for now she has also been placed on oral anticoagulation with Apixaban. Monitor her QT interval with EKGs periodically.  Hx of chronic bronchitis, allergic rhinitis. Stable currently. Continue with her Dulera, Singulair, Flonase.  Hypokalemia/Hypomagnesemia. This has been repleted.  Hyponatremia. Stable at this time. TSH was normal. As was cortisol level.  H/o GERD, Barrett's  esophagus. Continue  PPI.  Normocytic anemia Hemoglobin had dropped about 2 units. No overt bleeding has been identified. This is likely a combination of acute illness and postoperative state. Hemoglobin is stable today. There was no need for transfusion.  Constipation. She has been placed on a bowel regimen with stool softeners and laxatives. She'll be given an enema today. This can be pursued further at rehabilitation.  DM type II. Continue CBG and SSI. Her home medication regimen may be reinitiated in the next several days. A1c was not checked.  Acute encephalopathy in setting of hypoxemia Resolved.  Hx of Depression, insomnia, anxiety. Continue her home medications including alprazolam and fluoxetine.  S/p Rt knee arthroplasty. Management per ortho. She was transitioned over from her PCA pump to oral and parenteral as needed narcotics. Pain is reasonably well controlled. Continue with physical and occupational therapy.  Patient is stable. She is much improved from her respiratory standpoint. She has been accepted by inpatient rehabilitation. Cardiology to arrange for outpatient workup. She'll be discharged today.   PERTINENT LABS:  The results of significant diagnostics from this hospitalization (including imaging, microbiology, ancillary and laboratory) are listed below for reference.     Labs: Basic Metabolic Panel:  Recent Labs Lab 06/19/14 1629 06/20/14 0512 06/21/14 0215 06/21/14 0500 06/22/14 0409 06/23/14 0430 06/24/14 0413  NA 137 140 127* 132* 128* 133* 135*  K 3.6* 3.2* 3.1* 3.2* 3.3* 4.5 4.1  CL 97 96 87* 91* 91* 97 97  CO2 22 22 28 26 24 25 26   GLUCOSE 448* 188* 208* 211* 137* 161* 166*  BUN 18 15 12 11 15 8 8   CREATININE 0.92 0.82 0.67 0.61 0.71 0.61 0.62  CALCIUM 8.2* 8.4 7.5* 7.8* 7.3* 8.6 8.6  MG 1.8 1.5  --   --  1.4* 2.1  --   PHOS 6.7* 3.1  --   --   --   --   --    Liver Function Tests:  Recent Labs Lab 06/19/14 1629  AST 67*  ALT 73*  ALKPHOS 86  BILITOT  0.3  PROT 6.5  ALBUMIN 3.4*   CBC:  Recent Labs Lab 06/19/14 1629 06/20/14 0512 06/21/14 0215 06/22/14 0409 06/23/14 0430 06/24/14 0413  WBC 18.2* 14.7* 10.5 6.6 5.5 5.6  NEUTROABS 12.2*  --   --   --   --   --   HGB 13.8 13.7 11.2* 9.2* 8.9* 8.7*  HCT 42.0 40.8 32.0* 26.8* 25.8* 26.2*  MCV 93.1 91.5 90.9 90.2 90.2 92.3  PLT 302 246 179 130* 177 203   Cardiac Enzymes:  Recent Labs Lab 06/19/14 1622 06/19/14 2311 06/20/14 0512  CKTOTAL 151  --   --   CKMB 3.3  --   --   TROPONINI 0.67* 1.30* 1.88*   BNP: BNP (last 3 results)  Recent Labs  10/26/13 0128 06/19/14 1629  PROBNP 41.0 108.0   CBG:  Recent Labs Lab 06/23/14 1252 06/23/14 1748 06/23/14 2158 06/24/14 0715 06/24/14 1151  GLUCAP 178* 160* 131* 140* 196*     IMAGING STUDIES Dg Chest Port 1 View  06/21/2014   CLINICAL DATA:  Pulmonary edema.  COPD.  EXAM: PORTABLE CHEST - 1 VIEW  COMPARISON:  06/20/2014  FINDINGS: Interval extubation and removal of NG tube. Heart is upper limits normal in size. Continued interstitial prominence throughout the lungs, presumably interstitial edema, not significantly changed. No confluent opacities or effusions. No acute bony abnormality.  IMPRESSION: Continued stable interstitial prominence throughout the lungs.  Electronically Signed   By: Rolm Baptise M.D.   On: 06/21/2014 11:09   Dg Chest Port 1 View  06/20/2014   CLINICAL DATA:  Intubated patient.  , pulmonary edema  EXAM: PORTABLE CHEST - 1 VIEW  COMPARISON:  Portable chest x-ray of June 19, 2014  FINDINGS: The lungs are well-expanded. The interstitial markings are less prominent. The pulmonary vascularity is more distinct today. There is no significant pleural effusion nor pneumothorax. The cardiopericardial silhouette is normal in size. The mediastinum is normal in width.  The endotracheal tube tip lies 3.5 cm above the crotch of the carina. The esophagogastric tube tip projects below the inferior margin of  the image.  IMPRESSION: Improved appearance of the pulmonary interstitium consistent with resolving edema. There is no evidence of pneumonia nor significant pleural effusion. There has been interval withdrawal of the endotracheal tube such that its tip now lies 3.5 cm above the carina.   Electronically Signed   By: David  Martinique   On: 06/20/2014 07:45   Dg Chest Port 1 View  06/19/2014   CLINICAL DATA:  Endotracheal tube.  NG tube.  EXAM: PORTABLE CHEST - 1 VIEW  COMPARISON:  06/19/2014 at 1620 hr  FINDINGS: Endotracheal tube has been withdrawn since the prior study. The tip now projects approximately 5-10 mm above the carina. Enteric tube courses into the left upper abdomen with tip not imaged. The cardiomediastinal silhouette is unchanged. Diffuse bilateral lung opacities, greatest in the perihilar regions and lung bases, do not appear significantly changed. No pleural effusion or pneumothorax is identified.  IMPRESSION: 1. Interval retraction of endotracheal tube as above. Recommend retracting an additional 2-3 cm. 2. Similar appearance bilateral pulmonary opacities consistent with edema.   Electronically Signed   By: Logan Bores   On: 06/19/2014 17:48   Dg Chest Port 1 View  06/19/2014   CLINICAL DATA:  Pulmonary edema.  EXAM: PORTABLE CHEST - 1 VIEW  COMPARISON:  May 07, 2014.  FINDINGS: Stable cardiomediastinal silhouette. Nasogastric tube is seen entering the stomach. Endotracheal tube is seen directed into the right mainstem bronchus ; is is recommended to be withdrawn at least 4 cm. There is interval development of bilateral perihilar and basilar opacities concerning for pulmonary edema. No pneumothorax or significant pleural effusion is noted at this time.  IMPRESSION: Interval development of moderate bilateral perihilar and basilar pulmonary edema.  Nasogastric tube is seen directed in the right mainstem bronchus; it is recommended it be withdrawn at least 4 cm. Critical Value/emergent results  were called by telephone at the time of interpretation on 06/19/2014 at 4:42 pm to [facility], LPN, who verbally acknowledged these results.   Electronically Signed   By: Sabino Dick M.D.   On: 06/19/2014 16:43    DISCHARGE EXAMINATION: Filed Vitals:   06/24/14 0500 06/24/14 0534 06/24/14 0800 06/24/14 0907  BP:  128/60    Pulse:  70    Temp:  98.2 F (36.8 C)    TempSrc:  Oral    Resp:  20 18   Height:      Weight: 82.419 kg (181 lb 11.2 oz) 84 kg (185 lb 3 oz)    SpO2:  96% 97% 94%   General appearance: alert, cooperative, appears stated age and no distress Resp: clear to auscultation bilaterally Cardio: regular rate and rhythm, S1, S2 normal, no murmur, click, rub or gallop GI: soft, non-tender; bowel sounds normal; no masses,  no organomegaly Extremities: Swollen right knee due to recent  surgery.  DISPOSITION: : Inpatient rehabilitation   Current Inpatient Medications:  Scheduled: . amiodarone  200 mg Oral Daily  . antiseptic oral rinse  7 mL Mouth Rinse QID  . apixaban  5 mg Oral BID  . aspirin EC  81 mg Oral Daily  . atorvastatin  40 mg Oral QHS  . chlorhexidine  15 mL Mouth Rinse BID  . docusate sodium  100 mg Oral BID  . fluticasone  2 spray Each Nare Daily  . insulin aspart  0-15 Units Subcutaneous TID WC  . insulin glargine  10 Units Subcutaneous Daily  . mometasone-formoterol  1 puff Inhalation BID  . montelukast  10 mg Oral QHS  . pantoprazole  40 mg Oral Daily  . PARoxetine  20 mg Oral QHS  . polyethylene glycol  17 g Oral BID   Continuous:  NHA:FBXUXY chloride, acetaminophen **OR** [DISCONTINUED] acetaminophen, albuterol, ALPRAZolam, alum & mag hydroxide-simeth, HYDROmorphone (DILAUDID) injection, naloxone **AND** [DISCONTINUED] sodium chloride, oxyCODONE, phenol  Discharge medications to be addressed when she is discharged from inpatient rehabilitation.  ALLERGIES:  Allergies  Allergen Reactions  . Other Anaphylaxis    BEE STINGS- CLOSE OFF THROAT    . Penicillins Anaphylaxis    "CLOSES OFF MY BREATHING"  . Pneumococcal Vaccines Other (See Comments)    PP-23 vaccine; had BIG local red reaction with heat.   Jaclyn Prime [Ibandronate Sodium]     Jaw popping  . Ceclor [Cefaclor] Other (See Comments)    Reaction=burning all over  . Codeine Nausea And Vomiting  . Cortisone Hives    All over body     Follow-up Information    Follow up with Sanda Klein, MD.   Specialty:  Cardiology   Why:  will arrange follow up.   Contact information:   [address] [place] [address] [phone]       Follow up with GIOFFRE,RONALD A, MD. Schedule an appointment as soon as possible for a visit in 1 week.   Specialty:  Orthopedic Surgery   Why:  If symptoms worsen, For wound re-check   Contact information:   [address] [place] [address] [phone]       TOTAL DISCHARGE TIME: 35 mins  [facility] Pager [phone]  06/24/2014, 12:24 PM

## 2014-06-24 NOTE — Progress Notes (Signed)
Patient information reviewed and entered into [facility] system by Marinus Eicher, RN, CRRN, PPS Coordinator.  Information including medical coding and functional independence measure will be reviewed and updated through discharge.    

## 2014-06-25 ENCOUNTER — Inpatient Hospital Stay (HOSPITAL_COMMUNITY): Payer: Medicaid Other | Admitting: Occupational Therapy

## 2014-06-25 ENCOUNTER — Inpatient Hospital Stay (HOSPITAL_COMMUNITY): Payer: Medicaid Other | Admitting: Physical Therapy

## 2014-06-25 LAB — COMPREHENSIVE METABOLIC PANEL
ALT: 95 U/L — ABNORMAL HIGH (ref 0–35)
ANION GAP: 13 (ref 5–15)
AST: 55 U/L — AB (ref 0–37)
Albumin: 2.5 g/dL — ABNORMAL LOW (ref 3.5–5.2)
Alkaline Phosphatase: 175 U/L — ABNORMAL HIGH (ref 39–117)
BUN: 7 mg/dL (ref 6–23)
CHLORIDE: 95 meq/L — AB (ref 96–112)
CO2: 26 mEq/L (ref 19–32)
CREATININE: 0.6 mg/dL (ref 0.50–1.10)
Calcium: 8.3 mg/dL — ABNORMAL LOW (ref 8.4–10.5)
GFR calc Af Amer: >90 mL/min (ref >90)
GFR calc non Af Amer: >90 mL/min (ref >90)
Glucose, Bld: 139 mg/dL — ABNORMAL HIGH (ref 70–99)
Potassium: 3.9 mEq/L (ref 3.7–5.3)
Sodium: 134 mEq/L — ABNORMAL LOW (ref 137–147)
TOTAL PROTEIN: 6 g/dL (ref 6.0–8.3)
Total Bilirubin: 0.5 mg/dL (ref 0.3–1.2)

## 2014-06-25 LAB — CBC WITH DIFFERENTIAL/PLATELET
BASOS ABS: 0 10*3/uL (ref 0.0–0.1)
Basophils Relative: 0 % (ref 0–1)
EOS PCT: 4 % (ref 0–5)
Eosinophils Absolute: 0.2 10*3/uL (ref 0.0–0.7)
HCT: 27.1 % — ABNORMAL LOW (ref 36.0–46.0)
Hemoglobin: 8.9 g/dL — ABNORMAL LOW (ref 12.0–15.0)
Lymphocytes Relative: 20 % (ref 12–46)
Lymphs Abs: 1.3 10*3/uL (ref 0.7–4.0)
MCH: 30 pg (ref 26.0–34.0)
MCHC: 32.8 g/dL (ref 30.0–36.0)
MCV: 91.2 fL (ref 78.0–100.0)
Monocytes Absolute: 0.8 10*3/uL (ref 0.1–1.0)
Monocytes Relative: 13 % — ABNORMAL HIGH (ref 3–12)
NEUTROS ABS: 4.1 10*3/uL (ref 1.7–7.7)
Neutrophils Relative %: 63 % (ref 43–77)
Platelets: 233 10*3/uL (ref 150–400)
RBC: 2.97 MIL/uL — ABNORMAL LOW (ref 3.87–5.11)
RDW: 14.1 % (ref 11.5–15.5)
WBC: 6.5 10*3/uL (ref 4.0–10.5)

## 2014-06-25 LAB — GLUCOSE, CAPILLARY
GLUCOSE-CAPILLARY: 179 mg/dL — AB (ref 70–99)
Glucose-Capillary: 143 mg/dL — ABNORMAL HIGH (ref 70–99)
Glucose-Capillary: 145 mg/dL — ABNORMAL HIGH (ref 70–99)
Glucose-Capillary: 174 mg/dL — ABNORMAL HIGH (ref 70–99)

## 2014-06-25 MED ORDER — SENNOSIDES-DOCUSATE SODIUM 8.6-50 MG PO TABS
2.0000 | ORAL_TABLET | Freq: Two times a day (BID) | ORAL | Status: DC
  Administered 2014-06-25 – 2014-06-28 (×5): 2 via ORAL
  Filled 2014-06-25 (×8): qty 2

## 2014-06-25 MED ORDER — PANTOPRAZOLE SODIUM 40 MG PO TBEC
40.0000 mg | DELAYED_RELEASE_TABLET | Freq: Two times a day (BID) | ORAL | Status: DC
  Administered 2014-06-25 – 2014-06-28 (×6): 40 mg via ORAL
  Filled 2014-06-25 (×9): qty 1

## 2014-06-25 NOTE — Progress Notes (Signed)
63 y.o. 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 Post op course complicated by  VDRF due to flash pulmonary edema as well as A fib with RVR. She was started on IV amiodarone and converted into NSR. 2D echo with EF 15-20 5 with diffuse hypokinesis.  She was extubated on 11/20 and Dr. Ron Parker consulted for input and felt that edema was multifactorial and elevated troponin due to demand ischemia from acute pulmonary edema and A fib and felt that echo diagnostic of stress cardiomyopathy. She had recurrent A fib on 11/20 and was started on eliquis. Repeat echo with improvement in EF 55% and normal LV thickness/function and clinical progress consistent with Takotsubo syndrome  Subjective/Complaints: No CP or SOB +Right knee pain  ROS:  + constipation  Objective: Vital Signs: Blood pressure 121/56, pulse 63, temperature 98.4 F (36.9 C), temperature source Oral, resp. rate 16, height 5' 0.5" (1.537 m), weight 82.8 kg (182 lb 8.7 oz), SpO2 96 %. No results found. Results for orders placed or performed during the hospital encounter of 06/24/14 (from the past 72 hour(s))  Glucose, capillary     Status: Abnormal   Collection Time: 06/24/14  4:55 PM  Result Value Ref Range   Glucose-Capillary 114 (H) 70 - 99 mg/dL  Glucose, capillary     Status: Abnormal   Collection Time: 06/24/14  8:49 PM  Result Value Ref Range   Glucose-Capillary 215 (H) 70 - 99 mg/dL  Glucose, capillary     Status: Abnormal   Collection Time: 06/25/14  6:41 AM  Result Value Ref Range   Glucose-Capillary 143 (H) 70 - 99 mg/dL   Comment 1 Notify RN   CBC WITH DIFFERENTIAL     Status: Abnormal   Collection Time: 06/25/14  7:00 AM  Result Value Ref Range   WBC 6.5 4.0 - 10.5 K/uL   RBC 2.97 (L) 3.87 - 5.11 MIL/uL   Hemoglobin 8.9 (L) 12.0 - 15.0 g/dL   HCT 27.1 (L) 36.0 - 46.0 %   MCV 91.2 78.0 - 100.0 fL   MCH 30.0 26.0 - 34.0 pg   MCHC 32.8 30.0  - 36.0 g/dL   RDW 14.1 11.5 - 15.5 %   Platelets 233 150 - 400 K/uL   Neutrophils Relative % 63 43 - 77 %   Neutro Abs 4.1 1.7 - 7.7 K/uL   Lymphocytes Relative 20 12 - 46 %   Lymphs Abs 1.3 0.7 - 4.0 K/uL   Monocytes Relative 13 (H) 3 - 12 %   Monocytes Absolute 0.8 0.1 - 1.0 K/uL   Eosinophils Relative 4 0 - 5 %   Eosinophils Absolute 0.2 0.0 - 0.7 K/uL   Basophils Relative 0 0 - 1 %   Basophils Absolute 0.0 0.0 - 0.1 K/uL     HEENT: normal Cardio: irregular and no murmur Resp: CTA B/L and unlabored GI: BS positive and NT Extremity:  No Edema Skin:   Wound C/D/I Neuro: Alert/Oriented and Abnormal Motor R HF 3-, KE 3-, ankle 4- Musc/Skel:  Swelling Right knee Gen NAD   Assessment/Plan: 1. Functional deficits secondary to R-TKR complicated by VDRF, fluid overload and a fib which require 3+ hours per day of interdisciplinary therapy in a comprehensive inpatient rehab setting. Physiatrist is providing close team supervision and 24 hour management of active medical problems listed below. Physiatrist and rehab team continue to assess barriers to discharge/monitor patient  progress toward functional and medical goals. FIM:       FIM - Toileting Toileting steps completed by patient: Adjust clothing prior to toileting, Performs perineal hygiene, Adjust clothing after toileting           Comprehension Comprehension Mode: Auditory Comprehension: 4-Understands basic 75 - 89% of the time/requires cueing 10 - 24% of the time  Expression Expression Mode: Verbal Expression: 5-Expresses complex 90% of the time/cues < 10% of the time  Social Interaction Social Interaction: 5-Interacts appropriately 90% of the time - Needs monitoring or encouragement for participation or interaction.  Problem Solving Problem Solving: 5-Solves complex 90% of the time/cues < 10% of the time  Memory Memory: 5-Recognizes or recalls 90% of the time/requires cueing < 10% of the time  Medical  Problem List and Plan: 1. Functional deficits secondary to R-TKR complicated by VDRF, fluid overload and a fib 2.  DVT Prophylaxis/Anticoagulation: Pharmaceutical: Other (comment)--Eliquis 3. Pain Management: Will continue oxycodone prn with ice to help with edema.   4. Generalized anxiety disorder/Mood: On paxil daily with xanax prn. LCSW to follow for evaluation and support.   5. Neuropsych: This patient is capable of making decisions on her own behalf. 6. Skin/Wound Care:   Routine pressure relief measures. Add nutritional supplement to help with healing.               -likely cellulitis: begin empiric cipro             -check cbc 7. Fluids/Electrolytes/Nutrition: Monitor I/O and watch for recurrent signs of overload.    8. COPD: Respiratory status stable on dulera bid and Singulair daily.  9. HTN: Will monitor every 8 hours. Off Lasix at this time.   10. A fib with RVR:  Will monitor with checks every 8 hours. On amiodarone 200 mg daily now. 11. Demand ischemia of myocardium with Takotsubo syndrome: Will monitor weight daily. Low salt diet.   12. DM type 2: Monitor BS with ac/hs cbg checks. Was on metformin once a day at home. Will d/c lantus and titrate as needed. Use SSI for elevated BS.   13. ABLA:  Add iron supplement.  Hgb stable   LOS (Days) 1 A FACE TO FACE EVALUATION WAS PERFORMED  [name] [name] E 06/25/2014, 8:14 AM

## 2014-06-25 NOTE — Care Management Note (Signed)
[facility] Individual Statement of Services  Patient Name:  [name] [name]  Date:  06/25/2014  Welcome to the [facility].  Our goal is to provide you with an individualized program based on your diagnosis and situation, designed to meet your specific needs.  With this comprehensive rehabilitation program, you will be expected to participate in at least 3 hours of rehabilitation therapies Monday-Friday, with modified therapy programming on the weekends.  Your rehabilitation program will include the following services:  Physical Therapy (PT), Occupational Therapy (OT), 24 hour per day rehabilitation nursing, Case Management (Social Worker), Rehabilitation Medicine, Nutrition Services and Pharmacy Services  Weekly team conferences will be held on Wednesday to discuss your progress.  Your Social Worker will talk with you frequently to get your input and to update you on team discussions.  Team conferences with you and your family in attendance may also be held.  Expected length of stay: 5 days Overall anticipated outcome: mod/i level  Depending on your progress and recovery, your program may change. Your Social Worker will coordinate services and will keep you informed of any changes. Your Social Worker's name and contact numbers are listed  below.  The following services may also be recommended but are not provided by the [facility]:    [place] will be made to provide these services after discharge if needed.  Arrangements include referral to agencies that provide these services.  Your insurance has been verified to be:  Medicaid Your primary doctor is:  Velna Hatchet  Pertinent information will be shared with your doctor and your insurance company.  Social Worker:  Ovidio Kin, [place] or ([phone]  Information discussed with and  copy given to patient by: Elease Hashimoto, 06/25/2014, 11:47 AM

## 2014-06-25 NOTE — Progress Notes (Signed)
Physical Therapy Session Note  Patient Details  Name: [name] [name] MRN: [id number] Date of Birth: [birth year]  Today's Date: 06/25/2014 PT Individual Time: 1335-1405 PT Individual Time Calculation (min): 30 min   Short Term Goals: Week 1:  PT Short Term Goal 1 (Week 1): = LTGs due to anticipated LOS  Skilled Therapeutic Interventions/Progress Updates:  Pt received sitting in w/c, agreeable to therapy. When therapist adjusting R leg rest on w/c while holding patient's leg up, pt with increase in pain to 10/10 and SOB. Pt requires prolonged rest break and verbal cues for pursed lip breathing, pt also instructed to report increase in pain as she did not say anything until therapist was finished with task. Pt performed LB dressing at sit <> stand level from w/c using RW with close supervision and UB dressing in standing with supervision. Pt propelled w/c using BUE x 100 ft with 2 rest breaks, supervision. Pt performed squat pivot and stand pivot transfers using RW from w/c <> mat table with close supervision with focus on w/c setup and parts management. Pt returned to room and left sitting in w/c with all needs within reach, pt instructed to ask for ice packs at end of session.   Therapy Documentation Precautions:  Precautions Precautions: Fall Restrictions Weight Bearing Restrictions: No RLE Weight Bearing: Weight bearing as tolerated Pain: Pain Assessment Pain Assessment: 0-10 Pain Score: 10 Pain Type: Surgical pain Pain Location: Knee Pain Orientation: Right Pain Intervention(s): Repositioned Multiple Pain Sites: No  See FIM for current functional status  Therapy/Group: Individual Therapy  Laretta Alstrom 06/25/2014, 2:14 PM

## 2014-06-25 NOTE — Progress Notes (Signed)
Social Work Discharge Note Discharge Note  The overall goal for the admission was met for:   Discharge location: Yes-HOME TO [place].  Length of Stay: Yes-4 DAYS  Discharge activity level: Yes-MOD/I LEVEL  Home/community participation: Yes  Services provided included: MD, RD, PT, OT, RN, CM, Pharmacy and SW  Financial Services: Medicaid  Follow-up services arranged: Home Health: [facility]-PT,OT,RN, DME: [facility]-TUB BENCH and Patient/Family request agency HH: HAD THEM LAST YEAR, DME: [facility]  Comments (or additional information):PT DOING WELL-MOM CAN PROVIDE SUPERVISION LEVEL.  PT TO REACH MOD/I LEVEL  Patient/Family verbalized understanding of follow-up arrangements: Yes  Individual responsible for coordination of the follow-up plan: SELF & MOM  Confirmed correct DME delivered: [name] [name] 06/25/2014    [name] [name]

## 2014-06-25 NOTE — Progress Notes (Signed)
Occupational Therapy Session Note  Patient Details  Name: [name] [name] MRN: [id number] Date of Birth: 1950/08/07  Today's Date: 06/25/2014 OT Individual Time: 1300-1330 OT Individual Time Calculation (min): 30 min    Short Term Goals: Week 1:  OT Short Term Goal 1 (Week 1): STGs equal to LTGs based on LOS.  Skilled Therapeutic Interventions/Progress Updates:   Pt ambulated down to the ADL apartment with close supervision using the RW.  She needed one standing rest break at the nurses station secondary to fatigue.  O2 sats 96% on RA with HR 80 BPM.  Practiced tub shower transfers using the tub bench.  She was able to complete with min guard assist.  Educated her on the need for a hand held shower as well as sequencing for dressing.  She was able to transfer back out to the edge of the bench with min guard assist.  Once finished had her ambulate part of the way back to the room and then therapist pushed her via the wheelchair secondary to fatigue.  She was able to then transfer to the toilet with min assist as well.  Pt attempted to back up to the toilet and lost her balance requiring min assist from therapist to keep from falling.  Educated her on getting as close to the surface she is transferring to walking forward to eliminate ambulating backwards.  Pt left on toilet with nursing tech notified.  Therapy Documentation Precautions:  Precautions Precautions: Fall Restrictions Weight Bearing Restrictions: No RLE Weight Bearing: Weight bearing as tolerated  Pain: Pain Assessment Pain Assessment: Faces Pain Score: 5  Faces Pain Scale: Hurts little more Pain Type: Surgical pain Pain Location: Knee Pain Orientation: Right Pain Intervention(s): Repositioned Multiple Pain Sites: No ADL: See FIM for current functional status  Therapy/Group: Individual Therapy  Malynn Lucy OTR/L 06/25/2014, 3:48 PM

## 2014-06-25 NOTE — Evaluation (Signed)
Occupational Therapy Assessment and Plan  Patient Details  Name: [name] [name] MRN: [id number] Date of Birth: 1951-03-27  OT Diagnosis: acute pain, muscle weakness (generalized) and pain in joint Rehab Potential: Rehab Potential (ACUTE ONLY): Good ELOS: 3-5 days   Today's Date: 06/25/2014 OT Individual Time: 0800-0900 OT Individual Time Calculation (min): 60 min     Problem List:  Patient Active Problem List   Diagnosis Date Noted  . Primary osteoarthritis of right knee 06/24/2014  . Acute systolic heart failure 15/40/0867  . Takotsubo syndrome 06/21/2014  . Elevated troponin 06/20/2014  . Atrial fibrillation with RVR 06/20/2014  . Congestive dilated cardiomyopathy 06/20/2014  . Demand ischemia of myocardium 06/20/2014  . 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 Respiratory failure requiring intubation 06/19/2014  . Acute pulmonary edema 06/19/2014  . Acute respiratory failure with hypoxia 06/19/2014  . Hypoxemia   . Arterial hypotension   . Gait instability 10/26/2013  . Stroke 10/26/2013  . TIA (transient ischemic attack) 10/26/2013  . Tobacco user, hx of 05/20/2013  . Postoperative anemia due to acute blood loss 01/18/2013  . Osteoarthritis of left knee 01/17/2013  . Hypertension 12/07/2012  . Peripheral edema 12/07/2012  . DM type 2 (diabetes mellitus, type 2) 12/07/2012  . Chronic bronchitis with acute exacerbation 08/05/2012  . Seasonal and perennial allergic rhinitis 08/05/2012  . Urticaria 08/05/2012  . Chest pain 02/06/2012  . Hyperlipidemia 02/06/2012    Past Medical History:  Past Medical History  Diagnosis Date  . Insomnia, unspecified   . Esophageal reflux   . Hyperlipemia   . Asthma   . Osteoporosis   . Arthritis   . COPD (chronic obstructive pulmonary disease)   . Depression   . Personal history of colonic polyps 09/2010    TUBULAR ADENOMAS (X3); NEGATIVE FOR HIGH GRADE DYSPLASIA OR MALIGNANCY.  . Barrett esophagus   . Hiatal  hernia   . Esophageal stricture   . GERD (gastroesophageal reflux disease)   . GAD (generalized anxiety disorder)   . Aortic valve disorders   . Heart murmur   . Anxiety   . Shortness of breath     with exertion   . Pneumonia     hx of   . Type II or unspecified type diabetes mellitus without mention of complication, not stated as uncontrolled     on no meds   . Anemia     hx of years ago   . Hypertension 12/07/2012  . Myocardial infarction 01-08-13    '06-Chest pain-no stent-dx. MI-stress related  . History of kidney stones 01-08-13    past hx.  . Transfusion history 01-08-13    past hx. many yrs ago  . Dysrhythmia     heart skips per pt   . 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 Urinary frequency     AND INCONTINENCE   Past Surgical History:  Past Surgical History  Procedure Laterality Date  . Cholecystectomy  2005  . Partial hysterectomy    . Shoulder surgery  2011  . Tubal ligation    . Tonsillectomy    . Foot surgery  2005    Left foot  . Wrist flexion tendon tenotomies and proximal corpectomy w/ wrist arthrodesis&iliac crest bone graft    . Cardiac catheterization      normal per Dr Acie Fredrickson in note dated 02/06/12   . Knee arthroscopy  02/28/2012    Procedure: ARTHROSCOPY KNEE;  Surgeon: Tobi Bastos, MD;  Location: [facility];  Service: Orthopedics;  Laterality: Left;  . Esophageal dilation  01-08-13    2 yrs ago  . Cataract extraction, bilateral    . Total knee arthroplasty Left 01/17/2013    Procedure: LEFT TOTAL KNEE ARTHROPLASTY;  Surgeon: Tobi Bastos, MD;  Location: [facility];  Service: Orthopedics;  Laterality: Left;  . Total knee arthroplasty Right 06/19/2014    Procedure: RIGHT TOTAL KNEE ARTHROPLASTY;  Surgeon: Tobi Bastos, MD;  Location: [facility];  Service: Orthopedics;  Laterality: Right;    Assessment & Plan Clinical Impression: Patient is a 63 y.o. year old female with recent admission to the hospital on11/19/15 for R-TKR due to  endstage OA. Post op course complicated by VDRF due to flash pulmonary edema as well as A fib with RVR. She was started on IV amiodarone and converted into NSR. 2D echo with EF 15-20 5 with diffuse hypokinesis. She was extubated on 11/20 and Dr. Ron Parker consulted for input and felt that edema was multifactorial and elevated troponin due to demand ischemia from acute pulmonary edema and A fib and felt that echo diagnostic of stress cardiomyopathy.  Patient transferred to CIR on 06/24/2014 .    Patient currently requires min with basic self-care skills secondary to muscle weakness and muscle joint tightness and decreased standing balance.  Prior to hospitalization, patient could complete ADLs with modified independent .  Patient will benefit from skilled intervention to decrease level of assist with basic self-care skills prior to discharge home with care partner.  Anticipate patient will require intermittent supervision and no further OT follow recommended.  OT - End of Session Activity Tolerance: Tolerates 30+ min activity with multiple rests Endurance Deficit: No OT Assessment Rehab Potential (ACUTE ONLY): Good OT Patient demonstrates impairments in the following area(s): Balance;Cognition;Motor;Safety OT Basic ADL's Functional Problem(s): Grooming;Bathing;Dressing;Toileting OT Advanced ADL's Functional Problem(s): Simple Meal Preparation OT Transfers Functional Problem(s): Toilet;Tub/Shower OT Plan OT Intensity: Minimum of 1-2 x/day, 45 to 90 minutes OT Frequency: 5 out of 7 days OT Duration/Estimated Length of Stay: 3-5 days OT Treatment/Interventions: Training and development officer;Therapeutic Activities;Pain management;Self Care/advanced ADL retraining;DME/adaptive equipment instruction;Functional electrical stimulation;Patient/family education;UE/LE Strength taining/ROM;Therapeutic Exercise;Community reintegration;Discharge planning;Functional mobility training;Psychosocial support OT Self  Feeding Anticipated Outcome(s): independent OT Basic Self-Care Anticipated Outcome(s): modified independent OT Toileting Anticipated Outcome(s): modified independent OT Bathroom Transfers Anticipated Outcome(s): modified independent OT Recommendation Follow Up Recommendations: Home health OT Equipment Recommended: Tub/shower bench  OT Evaluation Precautions/Restrictions  Precautions Precautions: Fall Restrictions Weight Bearing Restrictions: No RLE Weight Bearing: Weight bearing as tolerated  Pain Pain Assessment Pain Assessment: 0-10 Pain Score: 8  Faces Pain Scale: Hurts little more Pain Type: Surgical pain Pain Location: Knee Pain Orientation: Right Pain Descriptors / Indicators: Other (Comment) (hot, pulling) Pain Onset: On-going Patients Stated Pain Goal: 4 Pain Intervention(s): Repositioned Multiple Pain Sites: No Home Living/Prior Functioning Home Living Available Help at Discharge: Family, Available 24 hours/day Type of Home: House Home Access: Stairs to enter [employer] of Steps: 3 at back door which pt used after R TKR last year Entrance Stairs-Rails: Right Home Layout: One level Additional Comments: Patient to discharge to mother's house  Lives With: Alone Prior Function Level of Independence: Requires assistive device for independence, Independent with basic ADLs, Independent with homemaking with ambulation, Independent with gait, Independent with transfers  Able to Take Stairs?: Yes Driving: Yes 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 Leisure: Hobbies-yes (Comment) Comments: go to the beach and go swimming, crochet,  knit, makes quilts ADL  See FIM scale for details  Vision/Perception  Vision- History Baseline Vision/History: Wears glasses Wears Glasses: At all times Patient Visual Report: No change from baseline Vision- Assessment Vision Assessment?: No apparent visual deficits  Cognition Overall Cognitive  Status: Within Functional Limits for tasks assessed Orientation Level: Oriented X4 Attention: Selective;Sustained Sustained Attention: Appears intact Selective Attention: Appears intact Memory: Appears intact Awareness: Appears intact Problem Solving: Appears intact Safety/Judgment: Appears intact Sensation Sensation Light Touch: Appears Intact (In bilateral UEs) Light Touch Impaired Details: Impaired RLE Stereognosis: Appears Intact Hot/Cold: Appears Intact Proprioception: Appears Intact Additional Comments: decreased LT throughout entire RLE Coordination Gross Motor Movements are Fluid and Coordinated: Yes Fine Motor Movements are Fluid and Coordinated: Yes Motor  Motor Motor: Within Functional Limits Motor - Skilled Clinical Observations: generalized weakness and decreased endurance Mobility  Bed Mobility Bed Mobility: Supine to Sit;Sit to Supine Supine to Sit: With rails;5: Supervision;HOB flat Sit to Supine: 5: Supervision;HOB flat Transfers Transfers: Sit to Stand;Stand to Sit Sit to Stand: With upper extremity assist;From toilet;From bed;4: Min assist Sit to Stand Details: Manual facilitation for weight shifting Stand to Sit: To toilet;Without upper extremity assist;5: Supervision  Trunk/Postural Assessment  Cervical Assessment Cervical Assessment: Exceptions to [facility] (Cervical protraction noted) Thoracic Assessment Thoracic Assessment: Exceptions to [facility] (Thoracic kyphosis noted in standing.) Lumbar Assessment Lumbar Assessment: Within Functional Limits Postural Control Postural Control: Within Functional Limits  Balance Balance Balance Assessed: Yes Dynamic Standing Balance Dynamic Standing - Balance Support: Right upper extremity supported;Left upper extremity supported;During functional activity Dynamic Standing - Level of Assistance: 5: Stand by assistance Extremity/Trunk Assessment RUE Assessment RUE Assessment: Within Functional Limits (AROM WFLs strength  3+/5 in the shoulder and 4/5 all other joints) LUE Assessment LUE Assessment: Within Functional Limits  FIM:  FIM - Eating Eating Activity: 7: Complete independence:no helper FIM - Grooming Grooming Steps: Wash, rinse, dry face;Wash, rinse, dry hands;Brush, comb hair Grooming: 5: Supervision: safety issues or verbal cues FIM - Bathing Bathing Steps Patient Completed: Chest;Right Arm;Left Arm;Abdomen;Front perineal area;Buttocks;Right upper leg;Left lower leg (including foot);Right lower leg (including foot);Left upper leg Bathing: 5: Supervision: Safety issues/verbal cues FIM - Upper Body Dressing/Undressing Upper body dressing/undressing: 5: Supervision: Safety issues/verbal cues FIM - Lower Body Dressing/Undressing Lower body dressing/undressing: 1: Total-Patient completed less than 25% of tasks FIM - Toileting Toileting steps completed by patient: Adjust clothing prior to toileting;Performs perineal hygiene;Adjust clothing after toileting Toileting: 5: Supervision: Safety issues/verbal cues FIM - Control and instrumentation engineer Devices: Walker;Bed rails Bed/Chair Transfer: 5: Bed > Chair or W/C: Supervision (verbal cues/safety issues);5: Chair or W/C > Bed: Supervision (verbal cues/safety issues);5: Supine > Sit: Supervision (verbal cues/safety issues);5: Sit > Supine: Supervision (verbal cues/safety issues) FIM - Air cabin crew Transfers Assistive Devices: Elevated toilet seat;Grab bars Toilet Transfers: 4-From toilet/BSC: Min A (steadying Pt. > 75%);5-To toilet/BSC: Supervision (verbal cues/safety issues) FIM - Tub/Shower Transfers Tub/shower Transfers: 0-Activity did not occur or was simulated   Refer to Care Plan for Long Term Goals  Recommendations for other services: None  Discharge Criteria: Patient will be discharged from OT if patient refuses treatment 3 consecutive times without medical reason, if treatment goals not met, if there is a change in  medical status, if patient makes no progress towards goals or if patient is discharged from hospital.  The above assessment, treatment plan, treatment alternatives and goals were discussed and mutually agreed upon: by patient   Pt performed bathing and dressing EOB during session.  He  was able to perform toilet transfers with min assist to regular toilet with rail.  Increased pain in the right knee with transfers and sit to stand.  Will need use of a RW for transfers as well as DME for toileting and shower/tub transfers.   Kailin Leu OTR/L 06/25/2014, 12:21 PM

## 2014-06-25 NOTE — Patient Care Conference (Signed)
Inpatient RehabilitationTeam Conference and Plan of Care Update Date: 06/25/2014   Time: 11:45 AM    Patient Name: [name] [name]      Medical Record Number: [id number]  Date of Birth: January 06, 1951 Sex: Female         Room/Bed: 4M04C/4M04C-01 Payor Info: Payor: MEDICAID [place] / Plan: MEDICAID [place] ACCESS / Product Type: *No Product type* /    Admitting Diagnosis: POST OP RESP FAILURE CARDIAC COMPLICATION  Admit Date/Time:  06/24/2014  4:03 PM Admission Comments: No comment available   Primary Diagnosis:  <principal problem not specified> Principal Problem: <principal problem not specified>  Patient Active Problem List   Diagnosis Date Noted  . Primary osteoarthritis of right knee 06/24/2014  . Acute systolic heart failure 24/58/0998  . Takotsubo syndrome 06/21/2014  . Elevated troponin 06/20/2014  . Atrial fibrillation with RVR 06/20/2014  . Congestive dilated cardiomyopathy 06/20/2014  . Demand ischemia of myocardium 06/20/2014  . 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 Respiratory failure requiring intubation 06/19/2014  . Acute pulmonary edema 06/19/2014  . Acute respiratory failure with hypoxia 06/19/2014  . Hypoxemia   . Arterial hypotension   . Gait instability 10/26/2013  . Stroke 10/26/2013  . TIA (transient ischemic attack) 10/26/2013  . Tobacco user, hx of 05/20/2013  . Postoperative anemia due to acute blood loss 01/18/2013  . Osteoarthritis of left knee 01/17/2013  . Hypertension 12/07/2012  . Peripheral edema 12/07/2012  . DM type 2 (diabetes mellitus, type 2) 12/07/2012  . Chronic bronchitis with acute exacerbation 08/05/2012  . Seasonal and perennial allergic rhinitis 08/05/2012  . Urticaria 08/05/2012  . Chest pain 02/06/2012  . Hyperlipidemia 02/06/2012    Expected Discharge Date: Expected Discharge Date: 06/28/14  Team Members Present: Physician leading conference: Dr. [name] Social Worker Present: Ovidio Kin,  LCSW Nurse Present: Heather Roberts, RN PT Present: Raylene Everts, PT;Lubna Stegeman Jari Favre, PT OT Present: Clyda Greener, Rhetta Mura, OT SLP Present: Windell Moulding, SLP     Current Status/Progress Goal Weekly Team Focus  Medical   Knee pain, postop, no chest pains or shortness of breath  Maintain medical stability  Adjust pain medications to ensure adequate therapy participation   Bowel/Bladder   N/A         Swallow/Nutrition/ Hydration     wfl        ADL's   supervision to min assist for bathing, dressing, toileting, increased pain and swelling in the right knee  modified independent level  selfcare re-training, endurance training, balance re-training, DME safety and education, pt/family education   Mobility   supervision-min A gait up to 30 ft using RW and 5 stairs with 2 rails   mod I household mobility except S for car transfer and stairs   safety with functional mobility, R knee ROM, RLE therex, activity tolerance/endurance, RLE weightbearing   Communication     na        Safety/Cognition/ Behavioral Observations  Pt is impulsive at times and needs to be reminded to call for assistance.  For patient to call for help be attempting ambulation  Reminding patient of safety awareness and to assure pt to utilize staff in aiding with transport   Pain   Pt has post surgical pain in right knee  Pt has set a goal of 2-3 for pain comfort level.  Pt would like to ambulate with minimal pain.  Pt is medicated prior to therapies.   Skin   N/A            *  See Care Plan and progress notes for long and short-term goals.  Barriers to Discharge: Cardiac deconditioning plus postoperative total knee replacement    Possible Resolutions to Barriers:  Continue rehabilitation program    Discharge Planning/Teaching Needs:    Home to mom's home where she will have 24 hr supervision Patient would benefit with wound care of surgical site and risk of infection at site   Team Discussion:  Pushing through  pain to continue therapies. Watching knee already on antibiotics-redden. Moving well already at supervision level-goals mod/i level  Revisions to Treatment Plan:  New eval   Continued Need for Acute Rehabilitation Level of Care: The patient requires daily medical management by a physician with specialized training in physical medicine and rehabilitation for the following conditions: Daily direction of a multidisciplinary physical rehabilitation program to ensure safe treatment while eliciting the highest outcome that is of practical value to the patient.: Yes Daily medical management of patient stability for increased activity during participation in an intensive rehabilitation regime.: Yes Daily analysis of laboratory values and/or radiology reports with any subsequent need for medication adjustment of medical intervention for : Other;Cardiac problems;Post surgical problems  Jazper Nikolai, Gardiner Rhyme 06/25/2014, 1:45 PM

## 2014-06-25 NOTE — Progress Notes (Signed)
Social Work Assessment and Plan Social Work Assessment and Plan  Patient Details  Name: [name] [name] MRN: [id number] Date of Birth: 1950-08-06  Today's Date: 06/25/2014  Problem List:  Patient Active Problem List   Diagnosis Date Noted  . Primary osteoarthritis of right knee 06/24/2014  . Acute systolic heart failure 85/88/5027  . Takotsubo syndrome 06/21/2014  . Elevated troponin 06/20/2014  . Atrial fibrillation with RVR 06/20/2014  . Congestive dilated cardiomyopathy 06/20/2014  . Demand ischemia of myocardium 06/20/2014  . 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 Respiratory failure requiring intubation 06/19/2014  . Acute pulmonary edema 06/19/2014  . Acute respiratory failure with hypoxia 06/19/2014  . Hypoxemia   . Arterial hypotension   . Gait instability 10/26/2013  . Stroke 10/26/2013  . TIA (transient ischemic attack) 10/26/2013  . Tobacco user, hx of 05/20/2013  . Postoperative anemia due to acute blood loss 01/18/2013  . Osteoarthritis of left knee 01/17/2013  . Hypertension 12/07/2012  . Peripheral edema 12/07/2012  . DM type 2 (diabetes mellitus, type 2) 12/07/2012  . Chronic bronchitis with acute exacerbation 08/05/2012  . Seasonal and perennial allergic rhinitis 08/05/2012  . Urticaria 08/05/2012  . Chest pain 02/06/2012  . Hyperlipidemia 02/06/2012   Past Medical History:  Past Medical History  Diagnosis Date  . Insomnia, unspecified   . Esophageal reflux   . Hyperlipemia   . Asthma   . Osteoporosis   . Arthritis   . COPD (chronic obstructive pulmonary disease)   . Depression   . Personal history of colonic polyps 09/2010    TUBULAR ADENOMAS (X3); NEGATIVE FOR HIGH GRADE DYSPLASIA OR MALIGNANCY.  . Barrett esophagus   . Hiatal hernia   . Esophageal stricture   . GERD (gastroesophageal reflux disease)   . GAD (generalized anxiety disorder)   . Aortic valve disorders   . Heart murmur   . Anxiety   . Shortness of breath      with exertion   . Pneumonia     hx of   . Type II or unspecified type diabetes mellitus without mention of complication, not stated as uncontrolled     on no meds   . Anemia     hx of years ago   . Hypertension 12/07/2012  . Myocardial infarction 01-08-13    '06-Chest pain-no stent-dx. MI-stress related  . History of kidney stones 01-08-13    past hx.  . Transfusion history 01-08-13    past hx. many yrs ago  . Dysrhythmia     heart skips per pt   . 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 Urinary frequency     AND INCONTINENCE   Past Surgical History:  Past Surgical History  Procedure Laterality Date  . Cholecystectomy  2005  . Partial hysterectomy    . Shoulder surgery  2011  . Tubal ligation    . Tonsillectomy    . Foot surgery  2005    Left foot  . Wrist flexion tendon tenotomies and proximal corpectomy w/ wrist arthrodesis&iliac crest bone graft    . Cardiac catheterization      normal per Dr Acie Fredrickson in note dated 02/06/12   . Knee arthroscopy  02/28/2012    Procedure: ARTHROSCOPY KNEE;  Surgeon: Tobi Bastos, MD;  Location: [facility];  Service: Orthopedics;  Laterality: Left;  . Esophageal dilation  01-08-13    2 yrs ago  . Cataract extraction, bilateral    .  Total knee arthroplasty Left 01/17/2013    Procedure: LEFT TOTAL KNEE ARTHROPLASTY;  Surgeon: Tobi Bastos, MD;  Location: [facility];  Service: Orthopedics;  Laterality: Left;  . Total knee arthroplasty Right 06/19/2014    Procedure: RIGHT TOTAL KNEE ARTHROPLASTY;  Surgeon: Tobi Bastos, MD;  Location: [facility];  Service: Orthopedics;  Laterality: Right;   Social History:  reports that she quit smoking about a year ago. Her smoking use included Cigarettes. She has a 10 pack-year smoking history. She quit smokeless tobacco use about 2 years ago. Her smokeless tobacco use included Snuff and Chew. She reports that she drinks about 0.6 oz of alcohol per week. She reports that she does not use illicit  drugs.  Family / Support Systems Marital Status: Widow/Widower Patient Roles: Parent Other Supports: Daisy Alvarenga-Mom  [phone]    John-brother  [phone]-home Anticipated Caregiver: Mom Ability/Limitations of Caregiver: Mom is elderly and eneds pt to be mod/i level Caregiver Availability: 24/7 Family Dynamics: Close knit family who rely upon one another.  Mom is supportive and plans to have pt come live with her upon discharge to make sure she is doing well before returning to her home.  Social History Preferred language: English Religion: Baptist Cultural Background: No issues Education: [employer] Read: Yes Write: Yes Employment Status: Disabled Date Retired/Disabled/Unemployed: Dietitian Issues: No issues Guardian/Conservator: None-according to MD pt is capable of making her own decisions while here   Abuse/Neglect Physical Abuse: Denies Verbal Abuse: Denies Sexual Abuse: Denies Exploitation of patient/patient's resources: Denies Self-Neglect: Denies  Emotional Status Pt's affect, behavior adn adjustment status: Pt is motivated to improve and go home, she is glad to being doing better now and reports: " I had a feeling I needed to get my papers in order."  She is trying to push through the pain and continue therapies.  She has always been able to do for herself. Recent Psychosocial Issues: Other health issues Pyschiatric History: History of anxiety takes medicines and feels they help her.  Deferred depression screen due to pt feeling she is doing well and not needed.  Will be short length of stay Substance Abuse History: No issues-quit tobacco   Patient / Family Perceptions, Expectations & Goals Pt/Family understanding of illness & functional limitations: Pt and Mom can explain her condition and treatment plans.  Both are glad she is doing better and has turned the corner.  She feels she needs to push forward due to she will have pain with  this. Premorbid pt/family roles/activities: Daughter, Mother, Retiree, etc Anticipated changes in roles/activities/participation: resume Pt/family expectations/goals: Pt states: " I want to take care of myself, before I leave here. "  Mom states: " I want her moving on her own, I can help some."  [employer]: None Premorbid Home Care/DME Agencies: Other (Comment) (Had in past) Transportation available at discharge: family  Discharge Planning Living Arrangements: Alone Support Systems: Children, Parent, Friends/neighbors, Church/faith community Type of Residence: Private residence Insurance Resources: Medicaid (specify county) Lawyer) Financial Resources: SSI Financial Screen Referred: No Living Expenses: Education officer, community Management: Patient Does the patient have any problems obtaining your medications?: No Home Management: Pateint Patient/Family Preliminary Plans: Pt plans to go to Mom's home utnil she is able to return to her home.  Her brother transports her to MD, appointments and other errands.  She has good support via her family.  Mom would like her to be using a walker and mobile before she is discharged.  Social Work Anticipated Follow Up Needs: HH/OP  Clinical Impression Pleasant female who has been through this surgery before last year, so knows what to expect.  She is thankful she is doing well and her family is very supportive.  She will go to Mom's home upon discharge.  Elease Hashimoto 06/25/2014, 12:31 PM

## 2014-06-25 NOTE — Evaluation (Signed)
Physical Therapy Assessment and Plan  Patient Details  Name: [name] [name] MRN: [id number] Date of Birth: 1951/02/03  PT Diagnosis: Difficulty walking, Edema, Impaired sensation, Muscle weakness and Pain in left knee Rehab Potential: Excellent ELOS: 3-5 days   Today's Date: 06/25/2014 PT Individual Time: 7939-0300 PT Individual Time Calculation (min): 65 min    Problem List:  Patient Active Problem List   Diagnosis Date Noted  . Primary osteoarthritis of right knee 06/24/2014  . Acute systolic heart failure 92/33/0076  . Takotsubo syndrome 06/21/2014  . Elevated troponin 06/20/2014  . Atrial fibrillation with RVR 06/20/2014  . Congestive dilated cardiomyopathy 06/20/2014  . Demand ischemia of myocardium 06/20/2014  . 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 Respiratory failure requiring intubation 06/19/2014  . Acute pulmonary edema 06/19/2014  . Acute respiratory failure with hypoxia 06/19/2014  . Hypoxemia   . Arterial hypotension   . Gait instability 10/26/2013  . Stroke 10/26/2013  . TIA (transient ischemic attack) 10/26/2013  . Tobacco user, hx of 05/20/2013  . Postoperative anemia due to acute blood loss 01/18/2013  . Osteoarthritis of left knee 01/17/2013  . Hypertension 12/07/2012  . Peripheral edema 12/07/2012  . DM type 2 (diabetes mellitus, type 2) 12/07/2012  . Chronic bronchitis with acute exacerbation 08/05/2012  . Seasonal and perennial allergic rhinitis 08/05/2012  . Urticaria 08/05/2012  . Chest pain 02/06/2012  . Hyperlipidemia 02/06/2012    Past Medical History:  Past Medical History  Diagnosis Date  . Insomnia, unspecified   . Esophageal reflux   . Hyperlipemia   . Asthma   . Osteoporosis   . Arthritis   . COPD (chronic obstructive pulmonary disease)   . Depression   . Personal history of colonic polyps 09/2010    TUBULAR ADENOMAS (X3); NEGATIVE FOR HIGH GRADE DYSPLASIA OR MALIGNANCY.  . Barrett esophagus   . Hiatal  hernia   . Esophageal stricture   . GERD (gastroesophageal reflux disease)   . GAD (generalized anxiety disorder)   . Aortic valve disorders   . Heart murmur   . Anxiety   . Shortness of breath     with exertion   . Pneumonia     hx of   . Type II or unspecified type diabetes mellitus without mention of complication, not stated as uncontrolled     on no meds   . Anemia     hx of years ago   . Hypertension 12/07/2012  . Myocardial infarction 01-08-13    '06-Chest pain-no stent-dx. MI-stress related  . History of kidney stones 01-08-13    past hx.  . Transfusion history 01-08-13    past hx. many yrs ago  . Dysrhythmia     heart skips per pt   . 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 Urinary frequency     AND INCONTINENCE   Past Surgical History:  Past Surgical History  Procedure Laterality Date  . Cholecystectomy  2005  . Partial hysterectomy    . Shoulder surgery  2011  . Tubal ligation    . Tonsillectomy    . Foot surgery  2005    Left foot  . Wrist flexion tendon tenotomies and proximal corpectomy w/ wrist arthrodesis&iliac crest bone graft    . Cardiac catheterization      normal per Dr Acie Fredrickson in note dated 02/06/12   . Knee arthroscopy  02/28/2012    Procedure: ARTHROSCOPY KNEE;  Surgeon:  Tobi Bastos, MD;  Location: [facility];  Service: Orthopedics;  Laterality: Left;  . Esophageal dilation  01-08-13    2 yrs ago  . Cataract extraction, bilateral    . Total knee arthroplasty Left 01/17/2013    Procedure: LEFT TOTAL KNEE ARTHROPLASTY;  Surgeon: Tobi Bastos, MD;  Location: [facility];  Service: Orthopedics;  Laterality: Left;  . Total knee arthroplasty Right 06/19/2014    Procedure: RIGHT TOTAL KNEE ARTHROPLASTY;  Surgeon: Tobi Bastos, MD;  Location: [facility];  Service: Orthopedics;  Laterality: Right;    Assessment & Plan Clinical Impression: [name] [name] is a 63 y.o. female with history of COPD, DM type 2, anxiety disorder, CVA; who was  admitted on 06/19/14 for R-TKR due to endstage OA. Post op course complicated by VDRF due to flash pulmonary edema as well as A fib with RVR. She was started on IV amiodarone and converted into NSR. 2D echo with EF 15-20 5 with diffuse hypokinesis. She was extubated on 11/20 and Dr. Ron Parker consulted for input and felt that edema was multifactorial and elevated troponin due to demand ischemia from acute pulmonary edema and A fib and felt that echo diagnostic of stress cardiomyopathy. She had recurrent A fib on 11/20 and was started on eliquis. Repeat echo with improvement in EF 55% and normal LV thickness/function and clinical progress consistent with Takotsubo syndrome. Cardiology recommends decreasing amiodarone to 200 mg/day, Eliquis for several months as well as [employer] for work up in the future. Right knee incision and surrounding site has been erythematous past 24 hours. Patient transferred to CIR on 06/24/2014.   Patient currently requires supervision- min assist with mobility secondary to pain, decreased knee ROM, muscle weakness and muscle joint tightness and decreased cardiorespiratoy endurance.  Prior to hospitalization, patient was modified independent  with mobility and lived with Alone in a House home.  Home access is 3 at back door which pt used after R TKR last yearStairs to enter.  Patient will benefit from skilled PT intervention to maximize safe functional mobility, minimize fall risk and decrease caregiver burden for planned discharge home with intermittent assist.  Anticipate patient will benefit from follow up [facility] at discharge.  PT - End of Session Activity Tolerance: Tolerates 30+ min activity with multiple rests;Decreased this session Endurance Deficit: Yes Endurance Deficit Description: pt with dyspnea on exertion with short distance ambulation and transfers requiring seated rest PT Assessment Rehab Potential (ACUTE/IP ONLY): Excellent Barriers to Discharge: Inaccessible  home environment PT Patient demonstrates impairments in the following area(s): Balance;Edema;Endurance;Motor;Nutrition;Pain;Safety;Sensory PT Transfers Functional Problem(s): Bed Mobility;Bed to Chair;Car;Furniture PT Locomotion Functional Problem(s): Ambulation;Wheelchair Mobility;Stairs PT Plan PT Intensity: Minimum of 1-2 x/day ,45 to 90 minutes PT Frequency: 5 out of 7 days PT Duration Estimated Length of Stay: 3-5 days PT Treatment/Interventions: Ambulation/gait training;Balance/vestibular training;Community reintegration;Discharge planning;DME/adaptive equipment instruction;Functional mobility training;Neuromuscular re-education;Pain management;Patient/family education;Stair training;Therapeutic Activities;Therapeutic Exercise;UE/LE Strength taining/ROM;UE/LE Coordination activities;Wheelchair propulsion/positioning PT Transfers Anticipated Outcome(s): mod I PT Locomotion Anticipated Outcome(s): mod I household mobility PT Recommendation Follow Up Recommendations: Home health PT Patient destination: Home Equipment Recommended: None recommended by PT Equipment Details: Pt has RW and [facility]  Skilled Therapeutic Intervention Skilled therapeutic intervention initiated after completion of evaluation. Discussed with patient falls risk, safety within room, and focus of therapy during stay. Discussed possible LOS, goals, and f/u therapy. Pt reports that following L TKR last year, she was able to negotiate up/down mother's back steps with RW. Right knee AROM only completed due to  increased redness around surgical site, pain, and hot to touch. RN and MD aware. Pt with 9-10/10 R knee pain but not pain limited with mobility. Pt left semi reclined in bed with all needs within reach, NT notified to bring ice pack for patient.   PT Evaluation Precautions/Restrictions Precautions Precautions: Fall Restrictions Weight Bearing Restrictions: Yes RLE Weight Bearing: Weight bearing as tolerated Pain Pain  Assessment Pain Assessment: 0-10 Pain Score: 9  Faces Pain Scale: Hurts whole lot Pain Type: Surgical pain Pain Location: Knee Pain Orientation: Right Pain Descriptors / Indicators: Other (Comment) (hot, pulling) Pain Onset: On-going Patients Stated Pain Goal: 4 Pain Intervention(s): Repositioned Multiple Pain Sites: No Home Living/Prior Functioning Home Living Available Help at Discharge: Family;Available 24 hours/day Type of Home: House Home Access: Stairs to enter [employer] of Steps: 3 at back door which pt used after R TKR last year Entrance Stairs-Rails: Right Home Layout: One level Additional Comments: Patient to discharge to mother's house  Lives With: Alone Prior Function Level of Independence: Requires assistive device for independence;Independent with basic ADLs;Independent with homemaking with ambulation;Independent with gait;Independent with transfers  Able to Take Stairs?: Yes Driving: Yes 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 Leisure: Hobbies-yes (Comment) Comments: go to the beach and go swimming, crochet, knit, makes quilts Vision/Perception   No changes from baseline  Cognition Overall Cognitive Status: Within Functional Limits for tasks assessed Orientation Level: Oriented X4 Sensation Sensation Light Touch: Impaired Detail Light Touch Impaired Details: Impaired RLE Additional Comments: decreased LT throughout entire RLE Motor  Motor Motor: Within Functional Limits Motor - Skilled Clinical Observations: generalized weakness and decreased endurance  Mobility Bed Mobility Bed Mobility: Supine to Sit;Sit to Supine Supine to Sit: With rails;5: Supervision;HOB flat Sit to Supine: 5: Supervision;HOB flat Transfers Transfers: Yes Sit to Stand: 5: Supervision;With upper extremity assist;With armrests;From chair/3-in-1;From bed Stand to Sit: 5: Supervision;With upper extremity assist;With armrests;To  chair/3-in-1 Stand Pivot Transfers: 5: Supervision;With armrests Locomotion  Ambulation Ambulation: Yes Ambulation/Gait Assistance: 5: Supervision Ambulation Distance (Feet): 30 Feet Assistive device: Rolling walker Ambulation/Gait Assistance Details: Verbal cues for sequencing;Verbal cues for gait pattern;Verbal cues for safe use of DME/AE Gait Gait: Yes Gait Pattern: Impaired Gait Pattern: Step-to pattern;Decreased stance time - right;Decreased hip/knee flexion - right;Decreased weight shift to right;Decreased step length - left;Decreased trunk rotation;Trunk flexed Gait velocity: decreased Stairs / Additional Locomotion Stairs: Yes Stairs Assistance: 4: Min guard Stair Management Technique: Two rails;Step to pattern;Forwards Number of Stairs: 5 Height of Stairs: 6 Architect: Yes Wheelchair Assistance: 5: Investment banker, operational Details: Verbal cues for Marketing executive: Both upper extremities Wheelchair Parts Management: Needs assistance Distance: 50  Trunk/Postural Assessment  Cervical Assessment Cervical Assessment: Exceptions to [facility] (forward head) Thoracic Assessment Thoracic Assessment: Exceptions to [facility] (kyphotic) Lumbar Assessment Lumbar Assessment: Within Functional Limits Postural Control Postural Control: Within Functional Limits  Balance Balance Balance Assessed: Yes Dynamic Standing Balance Dynamic Standing - Balance Support: Right upper extremity supported;Left upper extremity supported;During functional activity Dynamic Standing - Level of Assistance: 5: Stand by assistance Extremity Assessment  RUE Assessment RUE Assessment: Within Functional Limits (AROM WFLs strength 3+/5 in the shoulder and 4/5 all other joints) LUE Assessment LUE Assessment: Within Functional Limits RLE Assessment RLE Assessment: Exceptions to [facility] RLE AROM (degrees) Right Knee Extension: 18 (Lacking) Right Knee Flexion:  55 RLE Strength RLE Overall Strength: Deficits;Due to pain RLE Overall Strength Comments: no resistance applied due to pain except ankle DF/PF WFL LLE Assessment LLE Assessment: Within  Functional Limits  FIM:  FIM - Control and instrumentation engineer Devices: Walker;Bed rails Bed/Chair Transfer: 5: Bed > Chair or W/C: Supervision (verbal cues/safety issues);5: Chair or W/C > Bed: Supervision (verbal cues/safety issues);5: Supine > Sit: Supervision (verbal cues/safety issues);5: Sit > Supine: Supervision (verbal cues/safety issues) FIM - Locomotion: Wheelchair Distance: 50 Locomotion: Wheelchair: 2: Travels 50 - 149 ft with supervision, cueing or coaxing FIM - Locomotion: Ambulation Locomotion: Ambulation Assistive Devices: Administrator Ambulation/Gait Assistance: 5: Supervision Locomotion: Ambulation: 1: Travels less than 50 ft with supervision/safety issues FIM - Locomotion: Stairs Locomotion: Scientist, physiological: Hand rail - 2 Locomotion: Stairs: 2: Up and Down 4 - 11 stairs with minimal assistance (Pt.>75%)   Refer to Care Plan for Long Term Goals  Recommendations for other services: None  Discharge Criteria: Patient will be discharged from PT if patient refuses treatment 3 consecutive times without medical reason, if treatment goals not met, if there is a change in medical status, if patient makes no progress towards goals or if patient is discharged from hospital.  The above assessment, treatment plan, treatment alternatives and goals were discussed and mutually agreed upon: by patient  Laretta Alstrom 06/25/2014, 9:47 AM

## 2014-06-25 NOTE — Progress Notes (Signed)
Social Work Patient ID: [name] [name], female   DOB: 02/13/1951, 63 y.o.   MRN: [id number] Met with pt to report found out agency had last year for home health.  She had [facility] she would like them again. Have made referral for PT, OT, RN.

## 2014-06-25 NOTE — Discharge Instructions (Addendum)
Inpatient Rehab Discharge Instructions  [name] [name] Discharge date and time: 06/28/14   Activities/Precautions/ Functional Status: Activity: activity as tolerated No Driving Diet: cardiac diet Wound Care: keep wound clean and dry   Functional status:  ___ No restrictions     ___ Walk up steps independently ___ 24/7 supervision/assistance   ___ Walk up steps with assistance _X__ Intermittent supervision/assistance  _X__ Bathe/dress independently _X__ Walk with walker    ___ Bathe/dress with assistance ___ Walk Independently    ___ Shower independently ___ Walk with assistance    ___ Shower with assistance _X__ No alcohol     ___ Return to work/school ________  Special Instructions: 1. Call Dr. Gladstone Lighter   COMMUNITY REFERRALS UPON DISCHARGE:    Home Health:   PT, OT, RN  Agency:[facility] BLTJQ:[phone] Date of last service:06/28/2014   Medical Equipment/Items Ordered:TUB [place]   My questions have been answered and I understand these instructions. I will adhere to these goals and the provided educational materials after my discharge from the hospital.  Patient/Caregiver Signature _______________________________ Date __________  Clinician Signature _______________________________________ Date __________  Please bring this form and your medication list with you to all your follow-up doctor's appointments.     Information on my medicine - ELIQUIS (apixaban)  This medication education was reviewed with me or my healthcare representative as part of my discharge preparation.    Why was Eliquis prescribed for you? Eliquis was prescribed for you to reduce the risk of a blood clot forming that can cause a stroke if you have a medical condition called atrial fibrillation (a type of irregular heartbeat).  What do You need to know about Eliquis ? Take your Eliquis 5mg  TWICE DAILY - one tablet in the morning and one  tablet in the evening with or without food. If you have difficulty swallowing the tablet whole please discuss with your pharmacist how to take the medication safely.  Take Eliquis exactly as prescribed by your doctor and DO NOT stop taking Eliquis without talking to the doctor who prescribed the medication.  Stopping may increase your risk of developing a stroke.  Refill your prescription before you run out.  After discharge, you should have regular check-up appointments with your healthcare provider that is prescribing your Eliquis.  In the future your dose may need to be changed if your kidney function or weight changes by a significant amount or as you get older.  What do you do if you miss a dose? If you miss a dose, take it as soon as you remember on the same day and resume taking twice daily.  Do not take more than one dose of ELIQUIS at the same time to make up a missed dose.  Important Safety Information A possible side effect of Eliquis is bleeding. You should call your healthcare provider right away if you experience any of the following: ? Bleeding from an injury or your nose that does not stop. ? Unusual colored urine (red or dark brown) or unusual colored stools (red or black). ? Unusual bruising for unknown reasons. ? A serious fall or if you hit your head (even if there is no bleeding).  Some medicines may interact with Eliquis and might increase your risk of bleeding or clotting while on Eliquis. To help avoid this, consult your healthcare provider or pharmacist prior to using any new prescription or non-prescription medications, including herbals, vitamins, non-steroidal anti-inflammatory drugs (NSAIDs) and supplements.  This website has more information on Eliquis (apixaban): http://[email or web address].com/eliquis/home

## 2014-06-26 LAB — GLUCOSE, CAPILLARY
GLUCOSE-CAPILLARY: 174 mg/dL — AB (ref 70–99)
GLUCOSE-CAPILLARY: 170 mg/dL — AB (ref 70–99)
Glucose-Capillary: 159 mg/dL — ABNORMAL HIGH (ref 70–99)
Glucose-Capillary: 138 mg/dL — ABNORMAL HIGH (ref 70–99)

## 2014-06-26 NOTE — Plan of Care (Signed)
Problem: RH SKIN INTEGRITY Goal: RH STG SKIN FREE OF INFECTION/BREAKDOWN Pt will have no new skin breakdown/infection while on rehab with min assist of caregiver Outcome: Progressing Goal: RH STG MAINTAIN SKIN INTEGRITY WITH ASSISTANCE STG Maintain Skin Integrity With [name] [name].  Outcome: Progressing

## 2014-06-26 NOTE — Plan of Care (Signed)
Problem: RH SKIN INTEGRITY Goal: RH STG ABLE TO PERFORM INCISION/WOUND CARE W/ASSISTANCE STG Able To Perform Incision/Wound Care With [employer].  Outcome: Progressing  Problem: RH SAFETY Goal: RH STG DECREASED RISK OF FALL WITH ASSISTANCE STG Decreased Risk of Fall With [employer]. (reminders, cueing, etc) Outcome: Progressing  Problem: RH COGNITION-NURSING Goal: RH STG USES MEMORY AIDS/STRATEGIES W/ASSIST TO PROBLEM SOLVE STG Uses Memory Aids/Strategies With Min Assistance to Problem Solve.  Outcome: Progressing  Problem: RH PAIN MANAGEMENT Goal: RH STG PAIN MANAGED AT OR BELOW PT'S PAIN GOAL 3 or less on scale of 1-10 Outcome: Progressing  Problem: RH KNOWLEDGE DEFICIT Goal: RH STG INCREASE KNOWLEDGE OF DIABETES Outcome: Progressing

## 2014-06-26 NOTE — Plan of Care (Signed)
Problem: RH SKIN INTEGRITY Goal: RH STG SKIN FREE OF INFECTION/BREAKDOWN Pt will have no new skin breakdown/infection while on rehab with min assist of caregiver Outcome: Progressing

## 2014-06-26 NOTE — Progress Notes (Signed)
63 y.o. 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 Post op course complicated by  VDRF due to flash pulmonary edema as well as A fib with RVR. She was started on IV amiodarone and converted into NSR. 2D echo with EF 15-20 5 with diffuse hypokinesis.  She was extubated on 11/20 and Dr. Ron Parker consulted for input and felt that edema was multifactorial and elevated troponin due to demand ischemia from acute pulmonary edema and A fib and felt that echo diagnostic of stress cardiomyopathy. She had recurrent A fib on 11/20 and was started on eliquis. Repeat echo with improvement in EF 55% and normal LV thickness/function and clinical progress consistent with Takotsubo syndrome  Subjective/Complaints: Right knee feeling better. Excited that she's going home this weekend.  ROS:  occ constipation  Objective: Vital Signs: Blood pressure 143/62, pulse 75, temperature 98.5 F (36.9 C), temperature source Oral, resp. rate 16, height 5' 0.5" (1.537 m), weight 81.647 kg (180 lb), SpO2 93 %. No results found. Results for orders placed or performed during the hospital encounter of 06/24/14 (from the past 72 hour(s))  Glucose, capillary     Status: Abnormal   Collection Time: 06/24/14  4:55 PM  Result Value Ref Range   Glucose-Capillary 114 (H) 70 - 99 mg/dL  Glucose, capillary     Status: Abnormal   Collection Time: 06/24/14  8:49 PM  Result Value Ref Range   Glucose-Capillary 215 (H) 70 - 99 mg/dL  Glucose, capillary     Status: Abnormal   Collection Time: 06/25/14  6:41 AM  Result Value Ref Range   Glucose-Capillary 143 (H) 70 - 99 mg/dL   Comment 1 Notify RN   CBC WITH DIFFERENTIAL     Status: Abnormal   Collection Time: 06/25/14  7:00 AM  Result Value Ref Range   WBC 6.5 4.0 - 10.5 K/uL   RBC 2.97 (L) 3.87 - 5.11 MIL/uL   Hemoglobin 8.9 (L) 12.0 - 15.0 g/dL   HCT 27.1 (L) 36.0 - 46.0 %   MCV 91.2 78.0 - 100.0 fL   MCH  30.0 26.0 - 34.0 pg   MCHC 32.8 30.0 - 36.0 g/dL   RDW 14.1 11.5 - 15.5 %   Platelets 233 150 - 400 K/uL   Neutrophils Relative % 63 43 - 77 %   Neutro Abs 4.1 1.7 - 7.7 K/uL   Lymphocytes Relative 20 12 - 46 %   Lymphs Abs 1.3 0.7 - 4.0 K/uL   Monocytes Relative 13 (H) 3 - 12 %   Monocytes Absolute 0.8 0.1 - 1.0 K/uL   Eosinophils Relative 4 0 - 5 %   Eosinophils Absolute 0.2 0.0 - 0.7 K/uL   Basophils Relative 0 0 - 1 %   Basophils Absolute 0.0 0.0 - 0.1 K/uL  Comprehensive metabolic panel     Status: Abnormal   Collection Time: 06/25/14  7:00 AM  Result Value Ref Range   Sodium 134 (L) 137 - 147 mEq/L   Potassium 3.9 3.7 - 5.3 mEq/L   Chloride 95 (L) 96 - 112 mEq/L   CO2 26 19 - 32 mEq/L   Glucose, Bld 139 (H) 70 - 99 mg/dL   BUN 7 6 - 23 mg/dL   Creatinine, Ser 0.60 0.50 - 1.10 mg/dL   Calcium 8.3 (L) 8.4 - 10.5 mg/dL   Total Protein 6.0 6.0 - 8.3 g/dL   Albumin 2.5 (  L) 3.5 - 5.2 g/dL   AST 55 (H) 0 - 37 U/L   ALT 95 (H) 0 - 35 U/L   Alkaline Phosphatase 175 (H) 39 - 117 U/L   Total Bilirubin 0.5 0.3 - 1.2 mg/dL   GFR calc non Af Amer >90 >90 mL/min   GFR calc Af Amer >90 >90 mL/min    Comment: (NOTE) The eGFR has been calculated using the CKD EPI equation. This calculation has not been validated in all clinical situations. eGFR's persistently <90 mL/min signify possible Chronic Kidney Disease.    Anion gap 13 5 - 15  Glucose, capillary     Status: Abnormal   Collection Time: 06/25/14 11:23 AM  Result Value Ref Range   Glucose-Capillary 179 (H) 70 - 99 mg/dL  Glucose, capillary     Status: Abnormal   Collection Time: 06/25/14  4:30 PM  Result Value Ref Range   Glucose-Capillary 145 (H) 70 - 99 mg/dL  Glucose, capillary     Status: Abnormal   Collection Time: 06/25/14  8:35 PM  Result Value Ref Range   Glucose-Capillary 174 (H) 70 - 99 mg/dL   Comment 1 Notify RN      HEENT: normal Cardio: irregular and no murmur Resp: CTA B/L and unlabored GI: BS positive  and NT Extremity:  No Edema Skin:   Wound C/D/I Neuro: Alert/Oriented and Abnormal Motor R HF 3-, KE 3-, ankle 4- Musc/Skel:  Swelling Right knee, decreased erythema at knee. Pain improved. Gen NAD   Assessment/Plan: 1. Functional deficits secondary to R-TKR complicated by VDRF, fluid overload and a fib which require 3+ hours per day of interdisciplinary therapy in a comprehensive inpatient rehab setting. Physiatrist is providing close team supervision and 24 hour management of active medical problems listed below. Physiatrist and rehab team continue to assess barriers to discharge/monitor patient progress toward functional and medical goals. FIM: FIM - Bathing Bathing Steps Patient Completed: Chest, Right Arm, Left Arm, Abdomen, Front perineal area, Buttocks, Right upper leg, Left lower leg (including foot), Right lower leg (including foot), Left upper leg Bathing: 5: Supervision: Safety issues/verbal cues  FIM - Upper Body Dressing/Undressing Upper body dressing/undressing: 5: Supervision: Safety issues/verbal cues FIM - Lower Body Dressing/Undressing Lower body dressing/undressing: 1: Total-Patient completed less than 25% of tasks  FIM - Toileting Toileting steps completed by patient: Adjust clothing prior to toileting, Performs perineal hygiene, Adjust clothing after toileting Toileting: 5: Supervision: Safety issues/verbal cues  FIM - Radio producer Devices: Elevated toilet seat, Grab bars Toilet Transfers: 4-From toilet/BSC: Min A (steadying Pt. > 75%), 5-To toilet/BSC: Supervision (verbal cues/safety issues)  FIM - Control and instrumentation engineer Devices: Walker, Bed rails Bed/Chair Transfer: 5: Bed > Chair or W/C: Supervision (verbal cues/safety issues), 5: Chair or W/C > Bed: Supervision (verbal cues/safety issues), 5: Supine > Sit: Supervision (verbal cues/safety issues), 5: Sit > Supine: Supervision (verbal cues/safety  issues)  FIM - Locomotion: Wheelchair Distance: 50 Locomotion: Wheelchair: 2: Travels 50 - 149 ft with supervision, cueing or coaxing FIM - Locomotion: Ambulation Locomotion: Ambulation Assistive Devices: Administrator Ambulation/Gait Assistance: 5: Supervision Locomotion: Ambulation: 1: Travels less than 50 ft with supervision/safety issues  Comprehension Comprehension Mode: Auditory Comprehension: 4-Understands basic 75 - 89% of the time/requires cueing 10 - 24% of the time  Expression Expression Mode: Verbal Expression: 5-Expresses complex 90% of the time/cues < 10% of the time  Social Interaction Social Interaction: 5-Interacts appropriately 90% of the time - Needs monitoring  or encouragement for participation or interaction.  Problem Solving Problem Solving: 5-Solves complex 90% of the time/cues < 10% of the time  Memory Memory: 5-Recognizes or recalls 90% of the time/requires cueing < 10% of the time  Medical Problem List and Plan: 1. Functional deficits secondary to R-TKR complicated by VDRF, fluid overload and a fib 2.  DVT Prophylaxis/Anticoagulation: Pharmaceutical: Other (comment)--Eliquis 3. Pain Management: Will continue oxycodone prn with ice to help with edema.   4. Generalized anxiety disorder/Mood: On paxil daily with xanax prn. LCSW to follow for evaluation and support.   5. Neuropsych: This patient is capable of making decisions on her own behalf. 6. Skin/Wound Care:   Routine pressure relief measures. Add nutritional supplement to help with healing.               -likely cellulitis: improved appearance- continue  cipro             -wbc's normal 7. Fluids/Electrolytes/Nutrition: Monitor I/O and watch for recurrent signs of overload.    8. COPD: Respiratory status stable on dulera bid and Singulair daily.  9. HTN: Will monitor every 8 hours. Off Lasix at this time.   10. A fib with RVR:  Will monitor with checks every 8 hours. On amiodarone 200 mg daily  now. 11. Demand ischemia of myocardium with Takotsubo syndrome: Will monitor weight daily. Low salt diet.   12. DM type 2: Monitor BS with ac/hs cbg checks. Was on metformin once a day at home which has been resumed.  SSI  -sugars reasonable today 13. ABLA:  Added iron supplement.  Hgb stable   LOS (Days) 2 A FACE TO FACE EVALUATION WAS PERFORMED  SWARTZ,ZACHARY T 06/26/2014, 7:24 AM

## 2014-06-26 NOTE — Plan of Care (Signed)
Problem: RH BOWEL ELIMINATION Goal: RH STG MANAGE BOWEL W/MEDICATION W/ASSISTANCE STG Manage Bowel with Medication with Min Assistance.  Outcome: Progressing     

## 2014-06-26 NOTE — Plan of Care (Signed)
Problem: RH BOWEL ELIMINATION Goal: RH STG MANAGE BOWEL WITH ASSISTANCE STG Manage Bowel with Modified Independence  Outcome: Progressing

## 2014-06-27 ENCOUNTER — Encounter (HOSPITAL_COMMUNITY): Payer: Medicaid Other | Admitting: Occupational Therapy

## 2014-06-27 ENCOUNTER — Inpatient Hospital Stay (HOSPITAL_COMMUNITY): Payer: Medicaid Other | Admitting: Physical Therapy

## 2014-06-27 ENCOUNTER — Inpatient Hospital Stay (HOSPITAL_COMMUNITY): Payer: Medicaid Other | Admitting: Occupational Therapy

## 2014-06-27 LAB — GLUCOSE, CAPILLARY
GLUCOSE-CAPILLARY: 144 mg/dL — AB (ref 70–99)
Glucose-Capillary: 179 mg/dL — ABNORMAL HIGH (ref 70–99)
Glucose-Capillary: 138 mg/dL — ABNORMAL HIGH (ref 70–99)
Glucose-Capillary: 183 mg/dL — ABNORMAL HIGH (ref 70–99)

## 2014-06-27 MED ORDER — CIPROFLOXACIN HCL 250 MG PO TABS: 250.0000 mg | ORAL_TABLET | Freq: Two times a day (BID) | ORAL | Status: DC

## 2014-06-27 MED ORDER — SENNOSIDES-DOCUSATE SODIUM 8.6-50 MG PO TABS: 2.0000 | ORAL_TABLET | Freq: Two times a day (BID) | ORAL | Status: DC

## 2014-06-27 MED ORDER — AMIODARONE HCL 200 MG PO TABS: 200.0000 mg | ORAL_TABLET | Freq: Every day | ORAL | Status: DC

## 2014-06-27 MED ORDER — POLYETHYLENE GLYCOL 3350 17 G PO PACK: 17.0000 g | PACK | Freq: Two times a day (BID) | ORAL | Status: DC

## 2014-06-27 MED ORDER — ACETAMINOPHEN 325 MG PO TABS: 325.0000 mg | ORAL_TABLET | ORAL | Status: DC | PRN

## 2014-06-27 MED ORDER — FERROUS SULFATE 325 (65 FE) MG PO TABS: 325.0000 mg | ORAL_TABLET | Freq: Two times a day (BID) | ORAL | Status: DC

## 2014-06-27 MED ORDER — APIXABAN 5 MG PO TABS: 5.0000 mg | ORAL_TABLET | Freq: Two times a day (BID) | ORAL | Status: DC

## 2014-06-27 MED ORDER — METHOCARBAMOL 500 MG PO TABS: 500.0000 mg | ORAL_TABLET | Freq: Four times a day (QID) | ORAL | Status: DC | PRN

## 2014-06-27 MED ORDER — METFORMIN HCL 500 MG PO TABS: 500.0000 mg | ORAL_TABLET | Freq: Two times a day (BID) | ORAL | Status: DC

## 2014-06-27 MED ORDER — OXYCODONE HCL 5 MG PO TABS: 5.0000 mg | ORAL_TABLET | Freq: Four times a day (QID) | ORAL | Status: DC | PRN

## 2014-06-27 NOTE — Progress Notes (Signed)
Subjective/Complaints: Right knee feeling better. No severe  pains or drainage from R knee ROS:  occ constipation  Objective: Vital Signs: Blood pressure 127/54, pulse 84, temperature 98.3 F (36.8 C), temperature source Oral, resp. rate 18, height 5' 0.5" (1.537 m), weight 84.3 kg (185 lb 13.6 oz), SpO2 98 %. No results found. Results for orders placed or performed during the hospital encounter of 06/24/14 (from the past 72 hour(s))  Glucose, capillary     Status: Abnormal   Collection Time: 06/24/14  4:55 PM  Result Value Ref Range   Glucose-Capillary 114 (H) 70 - 99 mg/dL  Glucose, capillary     Status: Abnormal   Collection Time: 06/24/14  8:49 PM  Result Value Ref Range   Glucose-Capillary 215 (H) 70 - 99 mg/dL  Glucose, capillary     Status: Abnormal   Collection Time: 06/25/14  6:41 AM  Result Value Ref Range   Glucose-Capillary 143 (H) 70 - 99 mg/dL   Comment 1 Notify RN   CBC WITH DIFFERENTIAL     Status: Abnormal   Collection Time: 06/25/14  7:00 AM  Result Value Ref Range   WBC 6.5 4.0 - 10.5 K/uL   RBC 2.97 (L) 3.87 - 5.11 MIL/uL   Hemoglobin 8.9 (L) 12.0 - 15.0 g/dL   HCT 27.1 (L) 36.0 - 46.0 %   MCV 91.2 78.0 - 100.0 fL   MCH 30.0 26.0 - 34.0 pg   MCHC 32.8 30.0 - 36.0 g/dL   RDW 14.1 11.5 - 15.5 %   Platelets 233 150 - 400 K/uL   Neutrophils Relative % 63 43 - 77 %   Neutro Abs 4.1 1.7 - 7.7 K/uL   Lymphocytes Relative 20 12 - 46 %   Lymphs Abs 1.3 0.7 - 4.0 K/uL   Monocytes Relative 13 (H) 3 - 12 %   Monocytes Absolute 0.8 0.1 - 1.0 K/uL   Eosinophils Relative 4 0 - 5 %   Eosinophils Absolute 0.2 0.0 - 0.7 K/uL   Basophils Relative 0 0 - 1 %   Basophils Absolute 0.0 0.0 - 0.1 K/uL  Comprehensive metabolic panel     Status: Abnormal   Collection Time: 06/25/14  7:00 AM  Result Value Ref Range   Sodium 134 (L) 137 - 147 mEq/L   Potassium 3.9 3.7 - 5.3 mEq/L   Chloride 95 (L) 96 - 112 mEq/L   CO2 26 19 - 32 mEq/L   Glucose, Bld 139 (H) 70 - 99  mg/dL   BUN 7 6 - 23 mg/dL   Creatinine, Ser 0.60 0.50 - 1.10 mg/dL   Calcium 8.3 (L) 8.4 - 10.5 mg/dL   Total Protein 6.0 6.0 - 8.3 g/dL   Albumin 2.5 (L) 3.5 - 5.2 g/dL   AST 55 (H) 0 - 37 U/L   ALT 95 (H) 0 - 35 U/L   Alkaline Phosphatase 175 (H) 39 - 117 U/L   Total Bilirubin 0.5 0.3 - 1.2 mg/dL   GFR calc non Af Amer >90 >90 mL/min   GFR calc Af Amer >90 >90 mL/min    Comment: (NOTE) The eGFR has been calculated using the CKD EPI equation. This calculation has not been validated in all clinical situations. eGFR's persistently <90 mL/min signify possible Chronic Kidney Disease.    Anion gap 13 5 - 15  Glucose, capillary     Status: Abnormal   Collection Time: 06/25/14 11:23 AM  Result Value Ref Range   Glucose-Capillary 179 (  H) 70 - 99 mg/dL  Glucose, capillary     Status: Abnormal   Collection Time: 06/25/14  4:30 PM  Result Value Ref Range   Glucose-Capillary 145 (H) 70 - 99 mg/dL  Glucose, capillary     Status: Abnormal   Collection Time: 06/25/14  8:35 PM  Result Value Ref Range   Glucose-Capillary 174 (H) 70 - 99 mg/dL   Comment 1 Notify RN   Glucose, capillary     Status: Abnormal   Collection Time: 06/26/14  6:37 AM  Result Value Ref Range   Glucose-Capillary 159 (H) 70 - 99 mg/dL   Comment 1 Notify RN   Glucose, capillary     Status: Abnormal   Collection Time: 06/26/14 11:48 AM  Result Value Ref Range   Glucose-Capillary 174 (H) 70 - 99 mg/dL  Glucose, capillary     Status: Abnormal   Collection Time: 06/26/14  4:30 PM  Result Value Ref Range   Glucose-Capillary 170 (H) 70 - 99 mg/dL  Glucose, capillary     Status: Abnormal   Collection Time: 06/26/14  9:08 PM  Result Value Ref Range   Glucose-Capillary 138 (H) 70 - 99 mg/dL   Comment 1 Notify RN      HEENT: normal Cardio: irregular and no murmur Resp: CTA B/L and unlabored GI: BS positive and NT Extremity:  No Edema Skin:   Wound C/D/I Neuro: Alert/Oriented and Abnormal Motor R HF 3-, KE 3-,  ankle 4- Musc/Skel:  Swelling Right knee, decreased erythema at knee. Pain improved. Gen NAD   Assessment/Plan: 1. Functional deficits secondary to R-TKR complicated by VDRF, fluid overload and a fib which require 3+ hours per day of interdisciplinary therapy in a comprehensive inpatient rehab setting. Physiatrist is providing close team supervision and 24 hour management of active medical problems listed below. Physiatrist and rehab team continue to assess barriers to discharge/monitor patient progress toward functional and medical goals. FIM: FIM - Bathing Bathing Steps Patient Completed: Chest, Right Arm, Left Arm, Abdomen, Front perineal area, Buttocks, Right upper leg, Left lower leg (including foot), Right lower leg (including foot), Left upper leg Bathing: 5: Supervision: Safety issues/verbal cues  FIM - Upper Body Dressing/Undressing Upper body dressing/undressing steps patient completed: Thread/unthread right bra strap, Thread/unthread left bra strap, Thread/unthread right sleeve of pullover shirt/dresss, Thread/unthread left sleeve of pullover shirt/dress, Put head through opening of pull over shirt/dress, Pull shirt over trunk Upper body dressing/undressing: 4: Min-Patient completed 75 plus % of tasks FIM - Lower Body Dressing/Undressing Lower body dressing/undressing steps patient completed: Pull underwear up/down, Pull pants up/down Lower body dressing/undressing: 1: Total-Patient completed less than 25% of tasks  FIM - Toileting Toileting steps completed by patient: Adjust clothing prior to toileting, Performs perineal hygiene, Adjust clothing after toileting Toileting Assistive Devices: Grab bar or rail for support Toileting: 5: Supervision: Safety issues/verbal cues  FIM - Radio producer Devices: Elevated toilet seat, Grab bars Toilet Transfers: 4-From toilet/BSC: Min A (steadying Pt. > 75%), 5-To toilet/BSC: Supervision (verbal cues/safety  issues)  FIM - Control and instrumentation engineer Devices: Walker, Bed rails Bed/Chair Transfer: 5: Bed > Chair or W/C: Supervision (verbal cues/safety issues), 5: Chair or W/C > Bed: Supervision (verbal cues/safety issues), 5: Supine > Sit: Supervision (verbal cues/safety issues), 5: Sit > Supine: Supervision (verbal cues/safety issues)  FIM - Locomotion: Wheelchair Distance: 50 Locomotion: Wheelchair: 2: Travels 50 - 149 ft with supervision, cueing or coaxing FIM - Locomotion: Ambulation Locomotion: Ambulation Assistive Devices:  Walker - Rolling Ambulation/Gait Assistance: 5: Supervision Locomotion: Ambulation: 1: Travels less than 50 ft with supervision/safety issues  Comprehension Comprehension Mode: Auditory Comprehension: 5-Understands basic 90% of the time/requires cueing < 10% of the time  Expression Expression Mode: Verbal Expression: 5-Expresses complex 90% of the time/cues < 10% of the time  Social Interaction Social Interaction: 5-Interacts appropriately 90% of the time - Needs monitoring or encouragement for participation or interaction.  Problem Solving Problem Solving: 5-Solves complex 90% of the time/cues < 10% of the time  Memory Memory: 5-Recognizes or recalls 90% of the time/requires cueing < 10% of the time  Medical Problem List and Plan: 1. Functional deficits secondary to R-TKR complicated by VDRF, fluid overload and a fib 2.  DVT Prophylaxis/Anticoagulation: Pharmaceutical: Other (comment)--Eliquis 3. Pain Management: Will continue oxycodone prn with ice to help with edema.   4. Generalized anxiety disorder/Mood: On paxil daily with xanax prn. LCSW to follow for evaluation and support.   5. Neuropsych: This patient is capable of making decisions on her own behalf. 6. Skin/Wound Care:   Routine pressure relief measures. Add nutritional supplement to help with healing.               -likely cellulitis: improved appearance- continue  cipro              -wbc's normal 7. Fluids/Electrolytes/Nutrition: Monitor I/O and watch for recurrent signs of overload.    8. COPD: Respiratory status stable on dulera bid and Singulair daily.  9. HTN: Will monitor every 8 hours. Off Lasix at this time.   10. A fib with RVR:  Will monitor with checks every 8 hours. On amiodarone 200 mg daily now. 11. Demand ischemia of myocardium with Takotsubo syndrome: Will monitor weight daily. Low salt diet.   12. DM type 2: Monitor BS with ac/hs cbg checks. Was on metformin once a day at home which has been resumed.  SSI  -sugars reasonable today 13. ABLA:  Added iron supplement.  Hgb stable   LOS (Days) 3 A FACE TO FACE EVALUATION WAS PERFORMED  Charlett Blake 06/27/2014, 6:31 AM   63 y.o. 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 Post op course complicated by  VDRF due to flash pulmonary edema as well as A fib with RVR. She was started on IV amiodarone and converted into NSR. 2D echo with EF 15-20 5 with diffuse hypokinesis.  She was extubated on 11/20 and Dr. Ron Parker consulted for input and felt that edema was multifactorial and elevated troponin due to demand ischemia from acute pulmonary edema and A fib and felt that echo diagnostic of stress cardiomyopathy. She had recurrent A fib on 11/20 and was started on eliquis. Repeat echo with improvement in EF 55% and normal LV thickness/function and clinical progress consistent with Takotsubo syndrome  Subjective/Complaints:   ROS:  + constipation  Objective: Vital Signs: Blood pressure 127/54, pulse 84, temperature 98.3 F (36.8 C), temperature source Oral, resp. rate 18, height 5' 0.5" (1.537 m), weight 84.3 kg (185 lb 13.6 oz), SpO2 98 %. No results found. Results for orders placed or performed during the hospital encounter of 06/24/14 (from the past 72 hour(s))  Glucose, capillary     Status: Abnormal   Collection Time:  06/24/14  4:55 PM  Result Value Ref Range   Glucose-Capillary 114 (H) 70 - 99 mg/dL  Glucose, capillary     Status: Abnormal  Collection Time: 06/24/14  8:49 PM  Result Value Ref Range   Glucose-Capillary 215 (H) 70 - 99 mg/dL  Glucose, capillary     Status: Abnormal   Collection Time: 06/25/14  6:41 AM  Result Value Ref Range   Glucose-Capillary 143 (H) 70 - 99 mg/dL   Comment 1 Notify RN   CBC WITH DIFFERENTIAL     Status: Abnormal   Collection Time: 06/25/14  7:00 AM  Result Value Ref Range   WBC 6.5 4.0 - 10.5 K/uL   RBC 2.97 (L) 3.87 - 5.11 MIL/uL   Hemoglobin 8.9 (L) 12.0 - 15.0 g/dL   HCT 27.1 (L) 36.0 - 46.0 %   MCV 91.2 78.0 - 100.0 fL   MCH 30.0 26.0 - 34.0 pg   MCHC 32.8 30.0 - 36.0 g/dL   RDW 14.1 11.5 - 15.5 %   Platelets 233 150 - 400 K/uL   Neutrophils Relative % 63 43 - 77 %   Neutro Abs 4.1 1.7 - 7.7 K/uL   Lymphocytes Relative 20 12 - 46 %   Lymphs Abs 1.3 0.7 - 4.0 K/uL   Monocytes Relative 13 (H) 3 - 12 %   Monocytes Absolute 0.8 0.1 - 1.0 K/uL   Eosinophils Relative 4 0 - 5 %   Eosinophils Absolute 0.2 0.0 - 0.7 K/uL   Basophils Relative 0 0 - 1 %   Basophils Absolute 0.0 0.0 - 0.1 K/uL  Comprehensive metabolic panel     Status: Abnormal   Collection Time: 06/25/14  7:00 AM  Result Value Ref Range   Sodium 134 (L) 137 - 147 mEq/L   Potassium 3.9 3.7 - 5.3 mEq/L   Chloride 95 (L) 96 - 112 mEq/L   CO2 26 19 - 32 mEq/L   Glucose, Bld 139 (H) 70 - 99 mg/dL   BUN 7 6 - 23 mg/dL   Creatinine, Ser 0.60 0.50 - 1.10 mg/dL   Calcium 8.3 (L) 8.4 - 10.5 mg/dL   Total Protein 6.0 6.0 - 8.3 g/dL   Albumin 2.5 (L) 3.5 - 5.2 g/dL   AST 55 (H) 0 - 37 U/L   ALT 95 (H) 0 - 35 U/L   Alkaline Phosphatase 175 (H) 39 - 117 U/L   Total Bilirubin 0.5 0.3 - 1.2 mg/dL   GFR calc non Af Amer >90 >90 mL/min   GFR calc Af Amer >90 >90 mL/min    Comment: (NOTE) The eGFR has been calculated using the CKD EPI equation. This calculation has not been validated in all  clinical situations. eGFR's persistently <90 mL/min signify possible Chronic Kidney Disease.    Anion gap 13 5 - 15  Glucose, capillary     Status: Abnormal   Collection Time: 06/25/14 11:23 AM  Result Value Ref Range   Glucose-Capillary 179 (H) 70 - 99 mg/dL  Glucose, capillary     Status: Abnormal   Collection Time: 06/25/14  4:30 PM  Result Value Ref Range   Glucose-Capillary 145 (H) 70 - 99 mg/dL  Glucose, capillary     Status: Abnormal   Collection Time: 06/25/14  8:35 PM  Result Value Ref Range   Glucose-Capillary 174 (H) 70 - 99 mg/dL   Comment 1 Notify RN   Glucose, capillary     Status: Abnormal   Collection Time: 06/26/14  6:37 AM  Result Value Ref Range   Glucose-Capillary 159 (H) 70 - 99 mg/dL   Comment 1 Notify RN   Glucose, capillary  Status: Abnormal   Collection Time: 06/26/14 11:48 AM  Result Value Ref Range   Glucose-Capillary 174 (H) 70 - 99 mg/dL  Glucose, capillary     Status: Abnormal   Collection Time: 06/26/14  4:30 PM  Result Value Ref Range   Glucose-Capillary 170 (H) 70 - 99 mg/dL  Glucose, capillary     Status: Abnormal   Collection Time: 06/26/14  9:08 PM  Result Value Ref Range   Glucose-Capillary 138 (H) 70 - 99 mg/dL   Comment 1 Notify RN      HEENT: normal Cardio: irregular and no murmur Resp: CTA B/L and unlabored GI: BS positive and NT Extremity:  No Edema Skin:   Wound C/D/I Neuro: Alert/Oriented and Abnormal Motor R HF 3-, KE 3-, ankle 4- Musc/Skel:  Swelling Right knee Gen NAD   Assessment/Plan: 1. Functional deficits secondary to R-TKR complicated by VDRF, fluid overload and a fib which require 3+ hours per day of interdisciplinary therapy in a comprehensive inpatient rehab setting. Physiatrist is providing close team supervision and 24 hour management of active medical problems listed below. Physiatrist and rehab team continue to assess barriers to discharge/monitor patient progress toward functional and medical goals. Pt  wants to go home today   FIM: FIM - Bathing Bathing Steps Patient Completed: Chest, Right Arm, Left Arm, Abdomen, Front perineal area, Buttocks, Right upper leg, Left lower leg (including foot), Right lower leg (including foot), Left upper leg Bathing: 5: Supervision: Safety issues/verbal cues  FIM - Upper Body Dressing/Undressing Upper body dressing/undressing steps patient completed: Thread/unthread right bra strap, Thread/unthread left bra strap, Thread/unthread right sleeve of pullover shirt/dresss, Thread/unthread left sleeve of pullover shirt/dress, Put head through opening of pull over shirt/dress, Pull shirt over trunk Upper body dressing/undressing: 4: Min-Patient completed 75 plus % of tasks FIM - Lower Body Dressing/Undressing Lower body dressing/undressing steps patient completed: Pull underwear up/down, Pull pants up/down Lower body dressing/undressing: 1: Total-Patient completed less than 25% of tasks  FIM - Toileting Toileting steps completed by patient: Adjust clothing prior to toileting, Performs perineal hygiene, Adjust clothing after toileting Toileting Assistive Devices: Grab bar or rail for support Toileting: 5: Supervision: Safety issues/verbal cues  FIM - Radio producer Devices: Elevated toilet seat, Grab bars Toilet Transfers: 4-From toilet/BSC: Min A (steadying Pt. > 75%), 5-To toilet/BSC: Supervision (verbal cues/safety issues)  FIM - Control and instrumentation engineer Devices: Walker, Bed rails Bed/Chair Transfer: 5: Bed > Chair or W/C: Supervision (verbal cues/safety issues), 5: Chair or W/C > Bed: Supervision (verbal cues/safety issues), 5: Supine > Sit: Supervision (verbal cues/safety issues), 5: Sit > Supine: Supervision (verbal cues/safety issues)  FIM - Locomotion: Wheelchair Distance: 50 Locomotion: Wheelchair: 2: Travels 50 - 149 ft with supervision, cueing or coaxing FIM - Locomotion: Ambulation Locomotion:  Ambulation Assistive Devices: Administrator Ambulation/Gait Assistance: 5: Supervision Locomotion: Ambulation: 1: Travels less than 50 ft with supervision/safety issues  Comprehension Comprehension Mode: Auditory Comprehension: 5-Understands basic 90% of the time/requires cueing < 10% of the time  Expression Expression Mode: Verbal Expression: 5-Expresses complex 90% of the time/cues < 10% of the time  Social Interaction Social Interaction: 5-Interacts appropriately 90% of the time - Needs monitoring or encouragement for participation or interaction.  Problem Solving Problem Solving: 5-Solves complex 90% of the time/cues < 10% of the time  Memory Memory: 5-Recognizes or recalls 90% of the time/requires cueing < 10% of the time  Medical Problem List and Plan: 1. Functional deficits secondary to R-TKR  complicated by VDRF, fluid overload and a fib 2.  DVT Prophylaxis/Anticoagulation: Pharmaceutical: Other (comment)--Eliquis 3. Pain Management: Will continue oxycodone prn with ice to help with edema.   4. Generalized anxiety disorder/Mood: On paxil daily with xanax prn. LCSW to follow for evaluation and support.   5. Neuropsych: This patient is capable of making decisions on her own behalf. 6. Skin/Wound Care:   Routine pressure relief measures. Add nutritional supplement to help with healing.               -likely cellulitis: begin empiric cipro             -check cbc 7. Fluids/Electrolytes/Nutrition: Monitor I/O and watch for recurrent signs of overload.    8. COPD: Respiratory status stable on dulera bid and Singulair daily.  9. HTN: Will monitor every 8 hours. Off Lasix at this time.   10. A fib with RVR:  Will monitor with checks every 8 hours. On amiodarone 200 mg daily now. 11. Demand ischemia of myocardium with Takotsubo syndrome: Will monitor weight daily. Low salt diet.   12. DM type 2: Monitor BS with ac/hs cbg checks. Was on metformin once a day at home. Will d/c lantus  and titrate as needed. Use SSI for elevated BS.   13. ABLA:  Add iron supplement.  Hgb stable   LOS (Days) 3 A FACE TO FACE EVALUATION WAS PERFORMED  [name] [name] 06/27/2014, 6:31 AM

## 2014-06-27 NOTE — Progress Notes (Signed)
Physical Therapy Discharge Summary  Patient Details  Name: [name] [name] MRN: [id number] Date of Birth: 07-Oct-1950  Today's Date: 06/27/2014 PT Individual Time: 1505-1610 PT Individual Time Calculation (min): 65 min   Patient has met 8 of 8 long term goals due to improved activity tolerance, improved balance, increased strength, increased range of motion, decreased pain and functional use of  right lower extremity.  Patient to discharge at an ambulatory level Modified Independent.   Patient's care partner unavailable to provide the necessary physical assistance at discharge.  Reasons goals not met: NA  Recommendation:  Patient will benefit from ongoing skilled PT services in home health setting to continue to advance safe functional mobility, address ongoing impairments in pain, ROM, muscle weakness and muscle joint tightness, decreased endurance, and minimize fall risk.  Equipment: No equipment provided-has needed DME  Reasons for discharge: treatment goals met and discharge from hospital  Patient/family agrees with progress made and goals achieved: Yes  Skilled Therapeutic Intervention Patient received sitting in gym, handoff from OT. Gait training using RW 2 x 150 ft and 2 x 75 ft in controlled environment and x 20 ft in home environment with mod I. Pt performed basic transfers and furniture transfers to low compliant surface with mod I using RW. Pt performed bed mobility on flat bed in ADL apartment with mod I. Pt performed car transfer to [employer] using RW with supervision. Pt negotiated up/down 5 stairs using 2 rails with no cues needed for sequencing with mod I. Pt negotiated up/down curb step x 3 using RW with supervision-mod I, one cue for getting closer to step before lifting RW. Pt reporting need for bathroom and ambulated 150 ft using RW with mod I back to room. Pt made mod I in room with use of RW. Reviewed HEP handout with patient. Patient verbalized understanding and  educated on f/u therapy with no further questions regarding discharge. Pt left sitting EOB with NT present.  PT Discharge Precautions/Restrictions Precautions Precautions: Fall Restrictions Weight Bearing Restrictions: Yes RLE Weight Bearing: Weight bearing as tolerated Pain Pain Assessment Pain Assessment: 0-10 Pain Score: 9  Faces Pain Scale: Hurts little more Pain Type: Acute pain;Surgical pain Pain Location: Knee Pain Orientation: Right Pain Descriptors / Indicators: Tightness;Throbbing;Pressure Pain Onset: With Activity Pain Intervention(s): Elevated extremity;Rest Vision/Perception   No changes from baseline  Cognition Overall Cognitive Status: Within Functional Limits for tasks assessed Orientation Level: Oriented X4 Attention: Selective;Sustained Sustained Attention: Appears intact Selective Attention: Appears intact Memory: Appears intact Awareness: Appears intact Problem Solving: Appears intact Safety/Judgment: Appears intact Sensation Sensation Light Touch: Impaired Detail Light Touch Impaired Details: Impaired RLE Additional Comments: decreased LT throughout entire RLE Coordination Gross Motor Movements are Fluid and Coordinated: Yes Fine Motor Movements are Fluid and Coordinated: Yes Motor  Motor Motor: Within Functional Limits Motor - Skilled Clinical Observations: generalized weakness and decreased endurance  Mobility Bed Mobility Bed Mobility: Supine to Sit;Sit to Supine Supine to Sit: 6: Modified independent (Device/Increase time) Sit to Supine: 6: Modified independent (Device/Increase time) Transfers Sit to Stand: 6: Modified independent (Device/Increase time);From toilet Stand to Sit: 6: Modified independent (Device/Increase time);To toilet Locomotion  Ambulation Ambulation: Yes Ambulation/Gait Assistance: 6: Modified independent (Device/Increase time) Ambulation Distance (Feet): 150 Feet Assistive device: Rolling walker Gait Gait:  Yes Gait Pattern: Impaired Gait Pattern: Step-through pattern;Decreased stance time - right;Decreased hip/knee flexion - right;Decreased weight shift to right Gait velocity: decreased Stairs / Additional Locomotion Stairs: Yes Stairs Assistance: 6: Modified independent (Device/Increase time) Stair  Management Technique: Two rails;Step to pattern;Forwards Number of Stairs: 5 Height of Stairs: 6 Curb: 6: Modified independent (Device/increase time) (with RW) Wheelchair Mobility Wheelchair Mobility: No (pt ambulatory)  Trunk/Postural Assessment  Cervical Assessment Cervical Assessment: Exceptions to [facility] (forward head) Thoracic Assessment Thoracic Assessment: Exceptions to [facility] (kyphotic) Lumbar Assessment Lumbar Assessment: Within Functional Limits Postural Control Postural Control: Within Functional Limits  Balance Balance Balance Assessed: Yes Dynamic Standing Balance Dynamic Standing - Balance Support: Right upper extremity supported;Left upper extremity supported;During functional activity Dynamic Standing - Level of Assistance: 6: Modified independent (Device/Increase time) Extremity Assessment  RUE Assessment RUE Assessment: Within Functional Limits (AROM WFLs strength 3+/5 in the shoulder and 4/5 all other joints) LUE Assessment LUE Assessment: Within Functional Limits RLE Assessment RLE Assessment: Exceptions to [facility] RLE AROM (degrees) Right Knee Extension: 5 (Lacking) Right Knee Flexion: 58 RLE Strength RLE Overall Strength: Deficits;Due to pain RLE Overall Strength Comments: no resistance applied due to pain except ankle DF/PF WFL LLE Assessment LLE Assessment: Within Functional Limits  See FIM for current functional status  Laretta Alstrom 06/27/2014, 8:27 AM

## 2014-06-27 NOTE — Progress Notes (Signed)
Occupational Therapy Session Note  Patient Details  Name: [name] [name] MRN: [id number] Date of Birth: November 04, 1950  Today's Date: 06/27/2014 OT Individual Time: 0900-1000 OT Individual Time Calculation (min): 60 min    Short Term Goals: Week 1:  OT Short Term Goal 1 (Week 1): STGs equal to LTGs based on LOS.  Skilled Therapeutic Interventions/Progress Updates:    Pt performed bathing and dressing during session.  Overall modified independent for shower transfer with use of the RW and tub seat.  Pt able to perform all bathing sit to stand as well as dressing with modified independently.  She reported difficulty with SOB after bathing secondary to increased steam in the shower.  Discussed using luke warm water for bathing in the future to decrease the feeling of not being able to breathe.  Dyspnea 3/4 with transfer out to the EOB.  Oxygen sats 95% on room air with HR increasing to 91BPM.  Pt with one LOB to the left side when ambulating out to the bed requiring min guard assist to correct.  She ambulated to the sink for grooming tasks and completed them in standing with modified independence.    Therapy Documentation Precautions:  Precautions Precautions: Fall Restrictions Weight Bearing Restrictions: No RLE Weight Bearing: Weight bearing as tolerated  Pain: 3/10 on the faces scale in the right LE  ADL: See FIM for current functional status  Therapy/Group: Individual Therapy  Dartanyan Deasis OTR/L  06/27/2014, 12:23 PM

## 2014-06-27 NOTE — Plan of Care (Signed)
Problem: RH PAIN MANAGEMENT Goal: RH STG PAIN MANAGED AT OR BELOW PT'S PAIN GOAL 3 or less on scale of 1-10  Outcome: Progressing     

## 2014-06-27 NOTE — IPOC Note (Signed)
Overall Plan of Care [facility]) Patient Details Name: [name] [name] MRN: [id number] DOB: [birth year]  Admitting Diagnosis: POST OP RESP FAILURE CARDIAC COMPLICATION  Hospital Problems: Active Problems:   DM type 2 (diabetes mellitus, type 2)   Status post total right knee replacement   Atrial fibrillation with RVR   Takotsubo syndrome   Primary osteoarthritis of right knee     Functional Problem List: Nursing Bowel, Edema, Endurance, Medication Management, Nutrition, Pain, Safety, Skin Integrity  PT Balance, Edema, Endurance, Motor, Nutrition, Pain, Safety, Sensory  OT Balance, Cognition, Motor, Safety  SLP    TR         Basic ADL's: OT Grooming, Bathing, Dressing, Toileting     Advanced  ADL's: OT Simple Meal Preparation     Transfers: PT Bed Mobility, Bed to Chair, Car, Manufacturing systems engineer, Metallurgist: PT Ambulation, Emergency planning/management officer, Stairs     Additional Impairments: OT    SLP        TR      Anticipated Outcomes Item Anticipated Outcome  Self Feeding independent  Swallowing      Basic self-care  modified independent  Toileting  modified independent   Bathroom Transfers modified independent  Bowel/Bladder  MOD I  Transfers  mod I  Locomotion  mod I household mobility  Communication     Cognition     Pain  Less than 5  Safety/Judgment  Remain safe while admitted to Rehab   Therapy Plan: PT Intensity: Minimum of 1-2 x/day ,45 to 90 minutes PT Frequency: 5 out of 7 days PT Duration Estimated Length of Stay: 3-5 days OT Intensity: Minimum of 1-2 x/day, 45 to 90 minutes OT Frequency: 5 out of 7 days OT Duration/Estimated Length of Stay: 3-5 days         Team Interventions: Nursing Interventions Patient/Family Education, Bowel Management, Pain Management, Medication Management, Discharge Planning, Psychosocial Support  PT interventions Ambulation/gait training, Training and development officer, Community reintegration,  Discharge planning, DME/adaptive equipment instruction, Functional mobility training, Neuromuscular re-education, Pain management, Patient/family education, Stair training, Therapeutic Activities, Therapeutic Exercise, UE/LE Strength taining/ROM, UE/LE Coordination activities, Wheelchair propulsion/positioning  OT Interventions Training and development officer, Therapeutic Activities, Pain management, Self Care/advanced ADL retraining, DME/adaptive equipment instruction, Functional electrical stimulation, Patient/family education, UE/LE Strength taining/ROM, Therapeutic Exercise, Community reintegration, Discharge planning, Functional mobility training, Psychosocial support  SLP Interventions    TR Interventions    SW/CM Interventions Discharge Planning, Psychosocial Support, Patient/Family Education    Team Discharge Planning: Destination: PT-Home ,OT-   , SLP-  Projected Follow-up: PT-Home health PT, OT-  Home health OT, SLP-  Projected Equipment Needs: PT-None recommended by PT, OT- Tub/shower bench, SLP-  Equipment Details: PT-Pt has RW and SPC, OT-  Patient/family involved in discharge planning: PT- Patient,  OT-Patient, SLP-   MD ELOS: 10-14d Medical Rehab Prognosis:  Good Assessment: 63 y.o. 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 Post op course complicated by  VDRF due to flash pulmonary edema as well as A fib with RVR. She was started on IV amiodarone and converted into NSR. 2D echo with EF 15-20 5 with diffuse hypokinesis.  She was extubated on 11/20 and Dr. Ron Parker consulted for input and felt that edema was multifactorial and elevated troponin due to demand ischemia from acute pulmonary edema and A fib and felt that echo diagnostic of stress cardiomyopathy. She had recurrent A fib on 11/20  and was started on eliquis  Now requiring 24/7 Rehab RN,MD, as well as CIR level PT, OT and SLP.  Treatment team will focus on  ADLs and mobility with goals set at Mod I     See Team Conference Notes for weekly updates to the plan of care

## 2014-06-27 NOTE — Discharge Summary (Signed)
Physician Discharge Summary  Patient ID: [name] [name] MRN: [id number] DOB/AGE: 1950/09/08 63 y.o.  Admit date: 06/24/2014 Discharge date: 06/28/2014  Discharge Diagnoses:  Principal Problem:   Status post total right knee replacement Active Problems:   DM type 2 (diabetes mellitus, type 2)   Atrial fibrillation with RVR   Takotsubo syndrome   Primary osteoarthritis of right knee   Discharged Condition: Stable.   Labs:  Basic Metabolic Panel: No results for input(s): NA, K, CL, CO2, GLUCOSE, BUN, CREATININE, CALCIUM, MG, PHOS in the last 168 hours.  CBC: No results for input(s): WBC, NEUTROABS, HGB, HCT, MCV, PLT in the last 168 hours.  CBG:  Recent Labs Lab 06/27/14 1655 06/27/14 2045 06/28/14 0706 06/28/14 1150  GLUCAP 144* 183* 153* 158*    Brief HPI:   [name] [name] is a 63 y.o. 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 Post op course complicated by VDRF due to flash pulmonary edema as well as A fib with RVR as well as elevated troponin due to demand ischema. 2D echo with EF 15-20% with diffuse hypokinesis due to Takotsubo syndrome.  She had recurrent A fib on 11/20 and was placed on  eliquis. Repeat echo with improvement in EF 55% and normal LV thickness/function and Dr. Sallyanne Kuster recommended Eliquis for several months as well as Lexicographer for work up o outpatient basis. Right knee incision developed surrounding erythematous with warmth and has been monitored. CIR was recommended for follow up therapy by MD and rehab team.     Hospital Course: [name] [name] was admitted to rehab 06/24/2014 for inpatient therapies to consist of PT and OT at least three hours five days a week. Past admission physiatrist, therapy team and rehab RN have worked together to provide customized collaborative inpatient rehab. She was maintained on Eliquis during her stay and heart rate has been  controlled.  She has been afebrile during her stay but was started on Cipro due to continued erythema.  Labs done past admission revealed improvement in hyponatremia. CBC revealed that  H/H has been stable and no evidence of leucocytosis. Dr. Gladstone Lighter was contacted for follow up during her rehab stay. Surgical dressing remains in place and patient is to follow up early next week for post op check. Po intake has been good and she's continent of bowel and bladder. She has made steady progress during her rehab stay and is modified independent at discharge. She will continue to receive HHPT, [place]  and [place] by [place] care past discharge.    Rehab course: During patient's stay in rehab weekly team conferences were held to monitor patient's progress, set goals and discuss barriers to discharge.  Patient has had improvement in activity tolerance, balance, postural control, as well as ability to compensate for deficits.  She is able to perform bathing and dressing tasks independently. She is independent for transfers. She is able to ambulate  150 feet X 2 with RW and navigate 5 stairs independently.    Disposition:  Home  Diet: Regular  Special Instructions: 1. Follow up with Dr. Gladstone Lighter Mon/Tue for post op check.     Medication List    STOP taking these medications        benzonatate 200 MG capsule  Commonly known as:  TESSALON     furosemide 40 MG tablet  Commonly known as:  LASIX      TAKE  these medications        acetaminophen 325 MG tablet  Commonly known as:  TYLENOL  Take 1-2 tablets (325-650 mg total) by mouth every 4 (four) hours as needed for mild pain.     albuterol (2.5 MG/3ML) 0.083% nebulizer solution  Commonly known as:  PROVENTIL  Take 3 mLs (2.5 mg total) by nebulization every 6 (six) hours as needed for wheezing. DX  491.9     albuterol 108 (90 BASE) MCG/ACT inhaler  Commonly known as:  PROVENTIL HFA;VENTOLIN HFA  Inhale 2 puffs into the lungs every 4 (four) hours  as needed for wheezing or shortness of breath. Take two puffs four times a day as needed     ALPRAZolam 0.5 MG tablet  Commonly known as:  XANAX  Take 0.5 mg by mouth 3 (three) times daily.     amiodarone 200 MG tablet  Commonly known as:  PACERONE  Take 1 tablet (200 mg total) by mouth daily.     apixaban 5 MG Tabs tablet  Commonly known as:  ELIQUIS  Take 1 tablet (5 mg total) by mouth 2 (two) times daily.     aspirin EC 81 MG tablet  Take 81 mg by mouth every morning.     atorvastatin 40 MG tablet  Commonly known as:  LIPITOR  Take 1 tablet (40 mg total) by mouth at bedtime.     calcium carbonate 600 MG Tabs tablet  Commonly known as:  OS-CAL  Take 600 mg by mouth at bedtime.     CHANTIX STARTING MONTH PAK 0.5 MG X 11 & 1 MG X 42 tablet  Generic drug:  varenicline  TAKE AS DIRECTED     ciprofloxacin 250 MG tablet  Commonly known as:  CIPRO  Take 1 tablet (250 mg total) by mouth 2 (two) times daily.     dexlansoprazole 60 MG capsule  Commonly known as:  DEXILANT  Take 1 capsule (60 mg total) by mouth every morning.     docusate sodium 100 MG capsule  Commonly known as:  COLACE  Take 100 mg by mouth 2 (two) times daily.     EPINEPHrine 0.3 mg/0.3 mL Soaj injection  Commonly known as:  EPI-PEN  Inject 0.3 mLs (0.3 mg total) into the muscle once.     ferrous sulfate 325 (65 FE) MG tablet  Take 1 tablet (325 mg total) by mouth 2 (two) times daily.     fluticasone 50 MCG/ACT nasal spray  Commonly known as:  FLONASE  Place 2 sprays into the nose daily.     HONEY LEMON COUGH DROPS MT  Use as directed in the mouth or throat. Taking approx. 8-9 daily     metFORMIN 500 MG tablet  Commonly known as:  GLUCOPHAGE  Take 1 tablet (500 mg total) by mouth 2 (two) times daily.     methocarbamol 500 MG tablet  Commonly known as:  ROBAXIN  Take 1 tablet (500 mg total) by mouth every 6 (six) hours as needed for muscle spasms.     mometasone-formoterol 100-5 MCG/ACT Aero   Commonly known as:  DULERA  2 puffs then rinse mouth twice daily maintenance controller     montelukast 10 MG tablet  Commonly known as:  SINGULAIR  Take 1 tablet (10 mg total) by mouth at bedtime.     oxyCODONE 5 MG immediate release tablet--Rx # 100 pills   Commonly known as:  Oxy IR/ROXICODONE  Take 1 tablet (5 mg total) by  mouth every 6 (six) hours as needed for severe pain.     PARoxetine 20 MG tablet  Commonly known as:  PAXIL  Take 20 mg by mouth at bedtime.     polyethylene glycol packet  Commonly known as:  MIRALAX / GLYCOLAX  Take 17 g by mouth 2 (two) times daily.     senna-docusate 8.6-50 MG per tablet  Commonly known as:  Senokot-S  Take 2 tablets by mouth 2 (two) times daily.       Follow-up Information    Follow up with Velna Hatchet, MD.   Specialty:  Internal Medicine   Why:  MD TO FOLLOW UP AND MAKE FOLLOW UP APPOINTMENT   Contact information:   [address] [place] [address] [phone]       Follow up with Tobi Bastos, MD. Schedule an appointment as soon as possible for a visit today.   Specialty:  Orthopedic Surgery   Why:  for post op appointment in next 1-2 days   Contact information:   [address] [place] [address] [phone]       Follow up with Sanda Klein, MD.   Specialty:  Cardiology   Why:  for follow up on A fib/heart issues   Contact information:   [address] [place] [address] [phone]       Follow up with Charlett Blake, MD.   Specialty:  Physical Medicine and Rehabilitation   Why:  As needed   Contact information:   [place] [address] [phone]       Signed: Bary Leriche 07/04/2014, 2:59 PM

## 2014-06-27 NOTE — Progress Notes (Signed)
Occupational Therapy Discharge Summary  Patient Details  Name: [name] [name] MRN: [id number] Date of Birth: 01-15-1951  Today's Date: 06/27/2014 OT Individual Time: 1400-1500 OT Individual Time Calculation (min): 60 min   Session Note: Pt ambulated to the tub room for practice and review of tub/shower transfers.  She was able to perform transfer with supervision using a tub/shower bench which she has at home.  Demonstrated correct way to put shower bench together as well as her shower seat is apart for easier transport.  She was able to then ambulate to the ADL kitchen with modified independent.  In the kitchen educated her on correct technique for transporting items using the RW and her countertops.  She was able to replace items in different locations that therapist had removed for practicing.  Pt ambulated to the gym to conclude session and to wait for PT.     Patient has met 10 of 10 long term goals due to improved activity tolerance, improved balance and ability to compensate for deficits.  Patient to discharge at overall Modified Independent level.  Patient's care partner is independent and unavailable to provide the necessary physical assistance at discharge.    Reasons goals not met: NA  Recommendation:  Pt will not need any further OT at this time.  She is able to perform most selfcare tasks at a modified independent level but still needs close supervision for shower transfers.  Recommended use of a tub shower bench for safety as pt cannot step over the edge of the tub.  She plans to initially stay with her mother until she is able to return back to her house alone.  Equipment: tub bench  Reasons for discharge: treatment goals met and discharge from hospital  Patient/family agrees with progress made and goals achieved: Yes  OT Discharge Precautions/Restrictions  Precautions Precautions: Fall Restrictions Weight Bearing Restrictions: No RLE Weight Bearing: Weight bearing  as tolerated  Pain Pain Assessment Pain Assessment: 0-10 Pain Score: 9  Faces Pain Scale: Hurts little more Pain Type: Acute pain;Surgical pain Pain Location: Knee Pain Orientation: Right Pain Descriptors / Indicators: Tightness;Throbbing;Pressure Pain Onset: With Activity Pain Intervention(s): Elevated extremity;Rest ADL  See FIM scale for details  Vision/Perception  Vision- History Baseline Vision/History: Wears glasses Wears Glasses: At all times Patient Visual Report: No change from baseline Vision- Assessment Vision Assessment?: No apparent visual deficits  Cognition Overall Cognitive Status: Within Functional Limits for tasks assessed Orientation Level: Oriented X4 Attention: Selective;Sustained Sustained Attention: Appears intact Selective Attention: Appears intact Memory: Appears intact Awareness: Appears intact Problem Solving: Appears intact Safety/Judgment: Appears intact Sensation Sensation Light Touch: Appears Intact (In bilateral UEs) Stereognosis: Appears Intact Hot/Cold: Appears Intact Proprioception: Appears Intact Coordination Gross Motor Movements are Fluid and Coordinated: Yes Fine Motor Movements are Fluid and Coordinated: Yes Motor  Motor Motor: Within Functional Limits Motor - Skilled Clinical Observations: generalized weakness and decreased endurance Mobility  Bed Mobility Bed Mobility: Supine to Sit;Sit to Supine Supine to Sit: 6: Modified independent (Device/Increase time) Sit to Supine: 6: Modified independent (Device/Increase time) Transfers Transfers: Sit to Stand;Stand to Sit Sit to Stand: 6: Modified independent (Device/Increase time);From toilet Stand to Sit: 6: Modified independent (Device/Increase time);To toilet  Trunk/Postural Assessment  Cervical Assessment Cervical Assessment: Exceptions to [facility] (Forward cervical protraction) Thoracic Assessment Thoracic Assessment: Exceptions to [facility] (slight thoracic kyphosis) Lumbar  Assessment Lumbar Assessment: Within Functional Limits Postural Control Postural Control: Within Functional Limits  Balance Balance Balance Assessed: Yes Dynamic Standing Balance Dynamic Standing -  Balance Support: Right upper extremity supported;Left upper extremity supported;During functional activity Dynamic Standing - Level of Assistance: 6: Modified independent (Device/Increase time) Extremity/Trunk Assessment RUE Assessment RUE Assessment: Within Functional Limits (shoulder strength 3+/5 throughout and 4/5 in all other joints) LUE Assessment LUE Assessment: Within Functional Limits  See FIM for current functional status  Yina Riviere OTR/L 06/27/2014, 4:03 PM

## 2014-06-27 NOTE — Plan of Care (Signed)
Problem: RH BOWEL ELIMINATION Goal: RH STG MANAGE BOWEL WITH ASSISTANCE STG Manage Bowel with Modified Independence  Outcome: Progressing Goal: RH STG MANAGE BOWEL W/MEDICATION W/ASSISTANCE STG Manage Bowel with Medication with [place].  Outcome: Progressing  Problem: RH SKIN INTEGRITY Goal: RH STG SKIN FREE OF INFECTION/BREAKDOWN Pt will have no new skin breakdown/infection while on rehab with min assist of caregiver  Outcome: Progressing Goal: RH STG MAINTAIN SKIN INTEGRITY WITH ASSISTANCE STG Maintain Skin Integrity With [place].  Outcome: Progressing Goal: RH STG ABLE TO PERFORM INCISION/WOUND CARE W/ASSISTANCE STG Able To Perform Incision/Wound Care With [employer].  Outcome: Not Progressing  Problem: RH SAFETY Goal: RH STG ADHERE TO SAFETY PRECAUTIONS W/ASSISTANCE/DEVICE STG Adhere to Safety Precautions With Min Assistance/Device.  Outcome: Progressing Goal: RH STG DECREASED RISK OF FALL WITH ASSISTANCE STG Decreased Risk of Fall With [employer]. (reminders, cueing, etc)  Outcome: Progressing  Problem: RH PAIN MANAGEMENT Goal: RH STG PAIN MANAGED AT OR BELOW PT'S PAIN GOAL 3 or less on scale of 1-10  Outcome: Progressing  Problem: RH KNOWLEDGE DEFICIT Goal: RH STG INCREASE KNOWLEDGE OF DIABETES Patient/Caregiver will be able to independently verbalize strategies to manage diabetes at home( medications, diet, monitoring, follow up with MD, etc.)  At home patient not on insulin, on oral agent-Glucophage, checks blood glucose twice a day, monitors diet per discussion with patient. AMRV  Outcome: Progressing

## 2014-06-27 NOTE — Plan of Care (Signed)
Problem: RH Balance Goal: LTG: Patient will maintain dynamic sitting balance (OT) LTG: Patient will maintain dynamic sitting balance with assistance during activities of daily living (OT)  Outcome: Completed/Met Date Met:  06/27/14 Goal: LTG Patient will maintain dynamic standing with ADLs (OT) LTG: Patient will maintain dynamic standing balance with assist during activities of daily living (OT)  Outcome: Completed/Met Date Met:  06/27/14  Problem: RH Grooming Goal: LTG Patient will perform grooming w/assist,cues/equip (OT) LTG: Patient will perform grooming with assist, with/without cues using equipment (OT)  Outcome: Completed/Met Date Met:  06/27/14  Problem: RH Bathing Goal: LTG Patient will bathe with assist, cues/equipment (OT) LTG: Patient will bathe specified number of body parts with assist with/without cues using equipment (position) (OT)  Outcome: Completed/Met Date Met:  06/27/14  Problem: RH Dressing Goal: LTG Patient will perform upper body dressing (OT) LTG Patient will perform upper body dressing with assist, with/without cues (OT).  Outcome: Completed/Met Date Met:  06/27/14 Goal: LTG Patient will perform lower body dressing w/assist (OT) LTG: Patient will perform lower body dressing with assist, with/without cues in positioning using equipment (OT)  Outcome: Completed/Met Date Met:  06/27/14  Problem: RH Toileting Goal: LTG Patient will perform toileting w/assist, cues/equip (OT) LTG: Patient will perform toiletiing (clothes management/hygiene) with assist, with/without cues using equipment (OT)  Outcome: Completed/Met Date Met:  06/27/14  Problem: RH Simple Meal Prep Goal: LTG Patient will perform simple meal prep w/assist (OT) LTG: Patient will perform simple meal prep with assistance, with/without cues (OT).  Outcome: Completed/Met Date Met:  06/27/14  Problem: RH Toilet Transfers Goal: LTG Patient will perform toilet transfers w/assist (OT) LTG: Patient  will perform toilet transfers with assist, with/without cues using equipment (OT)  Outcome: Completed/Met Date Met:  06/27/14  Problem: RH Tub/Shower Transfers Goal: LTG Patient will perform tub/shower transfers w/assist (OT) LTG: Patient will perform tub/shower transfers with assist, with/without cues using equipment (OT)  Outcome: Completed/Met Date Met:  06/27/14

## 2014-06-28 DIAGNOSIS — E11351 Type 2 diabetes mellitus with proliferative diabetic retinopathy with macular edema: Secondary | ICD-10-CM

## 2014-06-28 LAB — GLUCOSE, CAPILLARY
GLUCOSE-CAPILLARY: 153 mg/dL — AB (ref 70–99)
Glucose-Capillary: 158 mg/dL — ABNORMAL HIGH (ref 70–99)

## 2014-06-28 NOTE — Progress Notes (Signed)
Patient left at 1308 staff member pushing wheelchair. family at her side.

## 2014-06-28 NOTE — Plan of Care (Signed)
Problem: RH BOWEL ELIMINATION Goal: RH STG MANAGE BOWEL WITH ASSISTANCE STG Manage Bowel with Modified Independence  Outcome: Completed/Met Date Met:  06/28/14 Goal: RH STG MANAGE BOWEL W/MEDICATION W/ASSISTANCE STG Manage Bowel with Medication with [place].  Outcome: Completed/Met Date Met:  06/28/14  Problem: RH SKIN INTEGRITY Goal: RH STG SKIN FREE OF INFECTION/BREAKDOWN Pt will have no new skin breakdown/infection while on rehab with min assist of caregiver  Outcome: Completed/Met Date Met:  06/28/14 Goal: RH STG MAINTAIN SKIN INTEGRITY WITH ASSISTANCE STG Maintain Skin Integrity With [place].  Outcome: Completed/Met Date Met:  06/28/14 Goal: RH STG ABLE TO PERFORM INCISION/WOUND CARE W/ASSISTANCE STG Able To Perform Incision/Wound Care With [employer].  Outcome: Progressing  Problem: RH SAFETY Goal: RH STG ADHERE TO SAFETY PRECAUTIONS W/ASSISTANCE/DEVICE STG Adhere to Safety Precautions With Min Assistance/Device.  Outcome: Completed/Met Date Met:  06/28/14 Goal: RH STG DECREASED RISK OF FALL WITH ASSISTANCE STG Decreased Risk of Fall With [employer]. (reminders, cueing, etc)  Outcome: Completed/Met Date Met:  06/28/14  Problem: RH COGNITION-NURSING Goal: RH STG USES MEMORY AIDS/STRATEGIES W/ASSIST TO PROBLEM SOLVE STG Uses Memory Aids/Strategies With Min Assistance to Problem Solve.  Outcome: Completed/Met Date Met:  06/28/14  Problem: RH PAIN MANAGEMENT Goal: RH STG PAIN MANAGED AT OR BELOW PT'S PAIN GOAL 3 or less on scale of 1-10  Outcome: Completed/Met Date Met:  06/28/14

## 2014-06-28 NOTE — Progress Notes (Signed)
Subjective/Complaints: Excited to go home! Feeling great! ROS:  neg  Objective: Vital Signs: Blood pressure 125/51, pulse 70, temperature 98.4 F (36.9 C), temperature source Oral, resp. rate 18, height 5' 0.5" (1.537 m), weight 84.6 kg (186 lb 8.2 oz), SpO2 97 %. No results found. Results for orders placed or performed during the hospital encounter of 06/24/14 (from the past 72 hour(s))  Glucose, capillary     Status: Abnormal   Collection Time: 06/25/14 11:23 AM  Result Value Ref Range   Glucose-Capillary 179 (H) 70 - 99 mg/dL  Glucose, capillary     Status: Abnormal   Collection Time: 06/25/14  4:30 PM  Result Value Ref Range   Glucose-Capillary 145 (H) 70 - 99 mg/dL  Glucose, capillary     Status: Abnormal   Collection Time: 06/25/14  8:35 PM  Result Value Ref Range   Glucose-Capillary 174 (H) 70 - 99 mg/dL   Comment 1 Notify RN   Glucose, capillary     Status: Abnormal   Collection Time: 06/26/14  6:37 AM  Result Value Ref Range   Glucose-Capillary 159 (H) 70 - 99 mg/dL   Comment 1 Notify RN   Glucose, capillary     Status: Abnormal   Collection Time: 06/26/14 11:48 AM  Result Value Ref Range   Glucose-Capillary 174 (H) 70 - 99 mg/dL  Glucose, capillary     Status: Abnormal   Collection Time: 06/26/14  4:30 PM  Result Value Ref Range   Glucose-Capillary 170 (H) 70 - 99 mg/dL  Glucose, capillary     Status: Abnormal   Collection Time: 06/26/14  9:08 PM  Result Value Ref Range   Glucose-Capillary 138 (H) 70 - 99 mg/dL   Comment 1 Notify RN   Glucose, capillary     Status: Abnormal   Collection Time: 06/27/14  6:42 AM  Result Value Ref Range   Glucose-Capillary 179 (H) 70 - 99 mg/dL   Comment 1 Notify RN   Glucose, capillary     Status: Abnormal   Collection Time: 06/27/14 11:30 AM  Result Value Ref Range   Glucose-Capillary 138 (H) 70 - 99 mg/dL   Comment 1 Notify RN   Glucose, capillary     Status: Abnormal   Collection Time: 06/27/14  4:55 PM  Result  Value Ref Range   Glucose-Capillary 144 (H) 70 - 99 mg/dL   Comment 1 Notify RN   Glucose, capillary     Status: Abnormal   Collection Time: 06/27/14  8:45 PM  Result Value Ref Range   Glucose-Capillary 183 (H) 70 - 99 mg/dL  Glucose, capillary     Status: Abnormal   Collection Time: 06/28/14  7:06 AM  Result Value Ref Range   Glucose-Capillary 153 (H) 70 - 99 mg/dL     HEENT: normal Cardio: irregular and no murmur Resp: CTA B/L and unlabored GI: BS positive and NT Extremity:  No Edema Skin:   Wound C/D/I Neuro: Alert/Oriented and Abnormal Motor R HF 3-, KE 3-, ankle 4- Musc/Skel:  Swelling Right knee, substantially decreased erythema at knee. Pain improved. Gen NAD   Assessment/Plan: 1. Functional deficits secondary to R-TKR complicated by VDRF, fluid overload and a fib which require 3+ hours per day of interdisciplinary therapy in a comprehensive inpatient rehab setting. Physiatrist is providing close team supervision and 24 hour management of active medical problems listed below. Physiatrist and rehab team continue to assess barriers to discharge/monitor patient progress toward functional and medical goals.  Dc home today with hh follow up. Instructions have been reviewed.  FIM: FIM - Bathing Bathing Steps Patient Completed: Chest, Right Arm, Left Arm, Abdomen, Front perineal area, Buttocks, Right upper leg, Left lower leg (including foot), Right lower leg (including foot), Left upper leg Bathing: 6: More than reasonable amount of time  FIM - Upper Body Dressing/Undressing Upper body dressing/undressing steps patient completed: Thread/unthread right bra strap, Thread/unthread left bra strap, Thread/unthread right sleeve of pullover shirt/dresss, Thread/unthread left sleeve of pullover shirt/dress, Put head through opening of pull over shirt/dress, Pull shirt over trunk Upper body dressing/undressing: 6: More than reasonable amount of time FIM - Lower Body  Dressing/Undressing Lower body dressing/undressing steps patient completed: Pull underwear up/down, Pull pants up/down, Thread/unthread right pants leg, Thread/unthread left pants leg, Don/Doff right sock, Thread/unthread right underwear leg, Thread/unthread left underwear leg, Don/Doff left sock, Don/Doff left shoe Lower body dressing/undressing: 6: More than reasonable amount of time  FIM - Toileting Toileting steps completed by patient: Adjust clothing prior to toileting, Performs perineal hygiene, Adjust clothing after toileting Toileting Assistive Devices: Grab bar or rail for support Toileting: 6: More than reasonable amount of time  FIM - Radio producer Devices: Bedside commode, Insurance account manager Transfers: 6-More than reasonable amt of time  FIM - Control and instrumentation engineer Devices: Copy: 6: Sit > Supine: No assist, 6: Supine > Sit: No assist, 6: Bed > Chair or W/C: No assist, 6: Chair or W/C > Bed: No assist  FIM - Locomotion: Wheelchair Distance: 50 Locomotion: Wheelchair: 0: Activity did not occur (pt ambulatory) FIM - Locomotion: Ambulation Locomotion: Ambulation Assistive Devices: Administrator Ambulation/Gait Assistance: 6: Modified independent (Device/Increase time) Locomotion: Ambulation: 6: Travels 150 ft or more with assistive device/no helper  Comprehension Comprehension Mode: Auditory Comprehension: 6-Follows complex conversation/direction: With extra time/assistive device  Expression Expression Mode: Verbal Expression: 6-Expresses complex ideas: With extra time/assistive device  Social Interaction Social Interaction: 7-Interacts appropriately with others - No medications needed.  Problem Solving Problem Solving: 7-Solves complex problems: Recognizes & self-corrects  Memory Memory: 7-Complete Independence: No helper  Medical Problem List and Plan: 1. 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  DVT Prophylaxis/Anticoagulation: Pharmaceutical: Other (comment)--Eliquis 3. Pain Management: Will continue oxycodone prn with ice to help with edema.   4. Generalized anxiety disorder/Mood: On paxil daily with xanax prn. LCSW to follow for evaluation and support.   5. Neuropsych: This patient is capable of making decisions on her own behalf. 6. Skin/Wound Care:   Routine pressure relief measures. Add nutritional supplement to help with healing.               -likely cellulitis: continue cipro             -wbc's normal 7. Fluids/Electrolytes/Nutrition: Monitor I/O and watch for recurrent signs of overload.    8. COPD: Respiratory status stable on dulera bid and Singulair daily.  9. HTN: Will monitor every 8 hours. Off Lasix at this time.   10. A fib with RVR:  Will monitor with checks every 8 hours. On amiodarone 200 mg daily now. 11. Demand ischemia of myocardium with Takotsubo syndrome: Will monitor weight daily. Low salt diet.   12. DM type 2: Monitor BS with ac/hs cbg checks. Was on metformin once a day at home which has been resumed.  SSI  -sugars reasonable today 13. ABLA:  Added iron supplement.  Hgb stable  LOS (Days) 4 A FACE TO FACE EVALUATION WAS PERFORMED  [name] [name] T 06/28/2014, 7:49 AM   63 y.o. 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 Post op course complicated by  VDRF due to flash pulmonary edema as well as A fib with RVR. She was started on IV amiodarone and converted into NSR. 2D echo with EF 15-20 5 with diffuse hypokinesis.  She was extubated on 11/20 and Dr. Ron Parker consulted for input and felt that edema was multifactorial and elevated troponin due to demand ischemia from acute pulmonary edema and A fib and felt that echo diagnostic of stress cardiomyopathy. She had recurrent A fib on 11/20 and was started on eliquis. Repeat echo  with improvement in EF 55% and normal LV thickness/function and clinical progress consistent with Takotsubo syndrome  Subjective/Complaints:   ROS:  + constipation  Objective: Vital Signs: Blood pressure 125/51, pulse 70, temperature 98.4 F (36.9 C), temperature source Oral, resp. rate 18, height 5' 0.5" (1.537 m), weight 84.6 kg (186 lb 8.2 oz), SpO2 97 %. No results found. Results for orders placed or performed during the hospital encounter of 06/24/14 (from the past 72 hour(s))  Glucose, capillary     Status: Abnormal   Collection Time: 06/25/14 11:23 AM  Result Value Ref Range   Glucose-Capillary 179 (H) 70 - 99 mg/dL  Glucose, capillary     Status: Abnormal   Collection Time: 06/25/14  4:30 PM  Result Value Ref Range   Glucose-Capillary 145 (H) 70 - 99 mg/dL  Glucose, capillary     Status: Abnormal   Collection Time: 06/25/14  8:35 PM  Result Value Ref Range   Glucose-Capillary 174 (H) 70 - 99 mg/dL   Comment 1 Notify RN   Glucose, capillary     Status: Abnormal   Collection Time: 06/26/14  6:37 AM  Result Value Ref Range   Glucose-Capillary 159 (H) 70 - 99 mg/dL   Comment 1 Notify RN   Glucose, capillary     Status: Abnormal   Collection Time: 06/26/14 11:48 AM  Result Value Ref Range   Glucose-Capillary 174 (H) 70 - 99 mg/dL  Glucose, capillary     Status: Abnormal   Collection Time: 06/26/14  4:30 PM  Result Value Ref Range   Glucose-Capillary 170 (H) 70 - 99 mg/dL  Glucose, capillary     Status: Abnormal   Collection Time: 06/26/14  9:08 PM  Result Value Ref Range   Glucose-Capillary 138 (H) 70 - 99 mg/dL   Comment 1 Notify RN   Glucose, capillary     Status: Abnormal   Collection Time: 06/27/14  6:42 AM  Result Value Ref Range   Glucose-Capillary 179 (H) 70 - 99 mg/dL   Comment 1 Notify RN   Glucose, capillary     Status: Abnormal   Collection Time: 06/27/14 11:30 AM  Result Value Ref Range   Glucose-Capillary 138 (H) 70 - 99 mg/dL   Comment 1 Notify RN    Glucose, capillary     Status: Abnormal   Collection Time: 06/27/14  4:55 PM  Result Value Ref Range   Glucose-Capillary 144 (H) 70 - 99 mg/dL   Comment 1 Notify RN   Glucose, capillary     Status: Abnormal   Collection Time: 06/27/14  8:45 PM  Result Value Ref Range   Glucose-Capillary 183 (H) 70 - 99 mg/dL  Glucose, capillary  Status: Abnormal   Collection Time: 06/28/14  7:06 AM  Result Value Ref Range   Glucose-Capillary 153 (H) 70 - 99 mg/dL     HEENT: normal Cardio: irregular and no murmur Resp: CTA B/L and unlabored GI: BS positive and NT Extremity:  No Edema Skin:   Wound C/D/I Neuro: Alert/Oriented and Abnormal Motor R HF 3-, KE 3-, ankle 4- Musc/Skel:  Swelling Right knee Gen NAD   Assessment/Plan: 1. Functional deficits secondary to R-TKR complicated by VDRF, fluid overload and a fib which require 3+ hours per day of interdisciplinary therapy in a comprehensive inpatient rehab setting. Physiatrist is providing close team supervision and 24 hour management of active medical problems listed below. Physiatrist and rehab team continue to assess barriers to discharge/monitor patient progress toward functional and medical goals. Pt wants to go home today   FIM: FIM - Bathing Bathing Steps Patient Completed: Chest, Right Arm, Left Arm, Abdomen, Front perineal area, Buttocks, Right upper leg, Left lower leg (including foot), Right lower leg (including foot), Left upper leg Bathing: 6: More than reasonable amount of time  FIM - Upper Body Dressing/Undressing Upper body dressing/undressing steps patient completed: Thread/unthread right bra strap, Thread/unthread left bra strap, Thread/unthread right sleeve of pullover shirt/dresss, Thread/unthread left sleeve of pullover shirt/dress, Put head through opening of pull over shirt/dress, Pull shirt over trunk Upper body dressing/undressing: 6: More than reasonable amount of time FIM - Lower Body Dressing/Undressing Lower  body dressing/undressing steps patient completed: Pull underwear up/down, Pull pants up/down, Thread/unthread right pants leg, Thread/unthread left pants leg, Don/Doff right sock, Thread/unthread right underwear leg, Thread/unthread left underwear leg, Don/Doff left sock, Don/Doff left shoe Lower body dressing/undressing: 6: More than reasonable amount of time  FIM - Toileting Toileting steps completed by patient: Adjust clothing prior to toileting, Performs perineal hygiene, Adjust clothing after toileting Toileting Assistive Devices: Grab bar or rail for support Toileting: 6: More than reasonable amount of time  FIM - Radio producer Devices: Bedside commode, Insurance account manager Transfers: 6-More than reasonable amt of time  FIM - Control and instrumentation engineer Devices: Copy: 6: Sit > Supine: No assist, 6: Supine > Sit: No assist, 6: Bed > Chair or W/C: No assist, 6: Chair or W/C > Bed: No assist  FIM - Locomotion: Wheelchair Distance: 50 Locomotion: Wheelchair: 0: Activity did not occur (pt ambulatory) FIM - Locomotion: Ambulation Locomotion: Ambulation Assistive Devices: Administrator Ambulation/Gait Assistance: 6: Modified independent (Device/Increase time) Locomotion: Ambulation: 6: Travels 150 ft or more with assistive device/no helper  Comprehension Comprehension Mode: Auditory Comprehension: 6-Follows complex conversation/direction: With extra time/assistive device  Expression Expression Mode: Verbal Expression: 6-Expresses complex ideas: With extra time/assistive device  Social Interaction Social Interaction: 7-Interacts appropriately with others - No medications needed.  Problem Solving Problem Solving: 7-Solves complex problems: Recognizes & self-corrects  Memory Memory: 7-Complete Independence: No helper  Medical Problem List and Plan: 1. 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  DVT Prophylaxis/Anticoagulation: Pharmaceutical: Other (comment)--Eliquis 3. Pain Management: Will continue oxycodone prn with ice to help with edema.   4. Generalized anxiety disorder/Mood: On paxil daily with xanax prn. LCSW to follow for evaluation and support.   5. Neuropsych: This patient is capable of making decisions on her own behalf. 6. Skin/Wound Care:   Routine pressure relief measures. Add nutritional supplement to help with healing.               -  likely cellulitis: begin empiric cipro             -check cbc 7. Fluids/Electrolytes/Nutrition: Monitor I/O and watch for recurrent signs of overload.    8. COPD: Respiratory status stable on dulera bid and Singulair daily.  9. HTN: Will monitor every 8 hours. Off Lasix at this time.   10. A fib with RVR:  Will monitor with checks every 8 hours. On amiodarone 200 mg daily now. 11. Demand ischemia of myocardium with Takotsubo syndrome: Will monitor weight daily. Low salt diet.   12. DM type 2: Monitor BS with ac/hs cbg checks. Was on metformin once a day at home. Will d/c lantus and titrate as needed. Use SSI for elevated BS.   13. ABLA:  Add iron supplement.  Hgb stable   LOS (Days) 4 A FACE TO FACE EVALUATION WAS PERFORMED  [name] [name] T 06/28/2014, 7:49 AM

## 2014-06-28 NOTE — Plan of Care (Signed)
Problem: RH KNOWLEDGE DEFICIT Goal: RH STG INCREASE KNOWLEDGE OF DIABETES Patient/Caregiver will be able to independently verbalize strategies to manage diabetes at home( medications, diet, monitoring, follow up with MD, etc.)  At home patient not on insulin, on oral agent-Glucophage, checks blood glucose twice a day, monitors diet per discussion with patient. AMRV  Outcome: Progressing

## 2014-06-30 ENCOUNTER — Telehealth: Payer: Self-pay | Admitting: Physical Medicine & Rehabilitation

## 2014-06-30 NOTE — Telephone Encounter (Signed)
Santiago Glad with [facility] (RN) called about an interaction with medication Cipro and Amiodarone.  Please call back at [phone]

## 2014-07-01 NOTE — Telephone Encounter (Signed)
They are required to notify us when they go out to start care and a medication alert is noted --between amiodarone and cipro

## 2014-07-02 ENCOUNTER — Telehealth: Payer: Self-pay | Admitting: *Deleted

## 2014-07-02 NOTE — Telephone Encounter (Signed)
Patient stated that her orthopedic therapist advised her that the amiodarone, eliquis, and cipro can interact with one another.  These medications were prescribed when she was recently in the hospital. Please advise. Thanks, MI

## 2014-07-02 NOTE — Telephone Encounter (Signed)
Notified patient of Dr. Elmarie Shiley advice regarding medications; patient verbalized understanding and agreement

## 2014-07-02 NOTE — Telephone Encounter (Signed)
These meds should be ok cipro should be short term.

## 2014-07-03 ENCOUNTER — Ambulatory Visit (INDEPENDENT_AMBULATORY_CARE_PROVIDER_SITE_OTHER): Payer: Medicaid Other | Admitting: Cardiovascular Disease

## 2014-07-03 ENCOUNTER — Encounter: Payer: Self-pay | Admitting: Cardiovascular Disease

## 2014-07-03 VITALS — BP 140/74 | HR 72 | Ht 60.5 in | Wt 176.1 lb

## 2014-07-03 DIAGNOSIS — I5021 Acute systolic (congestive) heart failure: Secondary | ICD-10-CM

## 2014-07-03 DIAGNOSIS — I4891 Unspecified atrial fibrillation: Secondary | ICD-10-CM

## 2014-07-03 MED ORDER — CARVEDILOL 3.125 MG PO TABS: 3.1250 mg | ORAL_TABLET | Freq: Two times a day (BID) | ORAL | Status: DC

## 2014-07-03 MED ORDER — LOSARTAN POTASSIUM 50 MG PO TABS: 50.0000 mg | ORAL_TABLET | Freq: Every day | ORAL | Status: DC

## 2014-07-03 NOTE — Assessment & Plan Note (Signed)
She is maintaining normal sinus rhythm. I suspect this was a complication from her surgery. She is on low-dose amiodarone. I've given her the okay to stop the amiodarone after she runs out of his current prescription. She should continue the Eliquis for another several months.

## 2014-07-03 NOTE — Progress Notes (Signed)
[name] [name] Date of Birth  17-Jul-1951       [facility]    [employer] [address], [address], [place], [place]  [address]   [place], [place]  [address] [phone]     [phone]   Fax  [phone]    Fax [phone]  Problem List: 1. Noncardiac chest pain 2. Normal heart catheterization 2006 3. Hyperlipidemia 4. Left knee pain  History of Present Illness:  [name] [name] has done well. It's been over 2 years since last saw her in the office. She has a history of noncardiac chest pain. She's had a normal heart catheterization the past. She complains of having some palpitations and shortness of breath particularly when she exerts herself but she's not been able to do much in the way of exercise with her bad knee. She  has been able to do all her normal activities without any significant problems.  Oct. 2, 2014:  [name] [name] is doing well.  No CP. She had left knee replacement this past summer.   She is doing well.   She still has some chest tightness.  Oct. 7, 2015;  [name] [name] has had bilateral knee surgeries ( left TKA, and right arthroscopic surgery)   She is doing better.  Some occasional cp  Dec. 3, 2015:  63 y.o. 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 Post op course complicated by VDRF due to flash pulmonary edema as well as A fib with RVR as well as elevated troponin due to demand ischema. 2D echo with EF 15-20% with diffuse hypokinesis due to Takotsubo syndrome. She had recurrent A fib on 11/20 and was placed on eliquis. Repeat echo with improvement in EF 55% and normal LV thickness/function and Dr. Sallyanne Kuster recommended Eliquis for several months as well as Lexicographer for work up o outpatient basis. Right knee incision developed surrounding erythematous with warmth and has been monitored. CIR was recommended for follow up therapy by MD and rehab  team.  She is seen in the office  She is breathing better.     Current Outpatient Prescriptions on File Prior to Visit  Medication Sig Dispense Refill  . acetaminophen (TYLENOL) 325 MG tablet Take 1-2 tablets (325-650 mg total) by mouth every 4 (four) hours as needed for mild pain.    [name] albuterol (PROVENTIL HFA;VENTOLIN HFA) 108 (90 BASE) MCG/ACT inhaler Inhale 2 puffs into the lungs every 4 (four) hours as needed for wheezing or shortness of breath. Take two puffs four times a day as needed 1 Inhaler prn  . albuterol (PROVENTIL) (2.5 MG/3ML) 0.083% nebulizer solution Take 3 mLs (2.5 mg total) by nebulization every 6 (six) hours as needed for wheezing. DX  491.9 150 mL 12  . ALPRAZolam (XANAX) 0.5 MG tablet Take 0.5 mg by mouth 3 (three) times daily.    [name] amiodarone (PACERONE) 200 MG tablet Take 1 tablet (200 mg total) by mouth daily. 30 tablet 1  . apixaban (ELIQUIS) 5 MG TABS tablet Take 1 tablet (5 mg total) by mouth 2 (two) times daily. 60 tablet 1  . atorvastatin (LIPITOR) 40 MG tablet Take 1 tablet (40 mg total) by mouth at bedtime. 30 tablet 0  . calcium carbonate (OS-CAL) 600 MG TABS Take 600 mg by mouth at bedtime.     . CHANTIX STARTING MONTH PAK 0.5 MG X 11 & 1 MG  X 42 tablet TAKE AS DIRECTED 53 tablet 0  . ciprofloxacin (CIPRO) 250 MG tablet Take 1 tablet (250 mg total) by mouth 2 (two) times daily. 20 tablet 0  . dexlansoprazole (DEXILANT) 60 MG capsule Take 1 capsule (60 mg total) by mouth every morning. 30 capsule 5  . docusate sodium (COLACE) 100 MG capsule Take 100 mg by mouth 2 (two) times daily.    [name] EPINEPHrine 0.3 mg/0.3 mL IJ SOAJ injection Inject 0.3 mLs (0.3 mg total) into the muscle once. (Patient taking differently: Inject 0.3 mg into the muscle as needed. ) 1 Device 1  . ferrous sulfate 325 (65 FE) MG tablet Take 1 tablet (325 mg total) by mouth 2 (two) times daily. 60 tablet 1  . fluticasone (FLONASE) 50 MCG/ACT nasal spray Place 2 sprays into the nose daily.  (Patient taking differently: Place 1 spray into the nose 2 (two) times daily. ) 16 g prn  . Menthol (HONEY LEMON COUGH DROPS MT) Use as directed in the mouth or throat. Taking approx. 8-9 daily    . metFORMIN (GLUCOPHAGE) 500 MG tablet Take 1 tablet (500 mg total) by mouth 2 (two) times daily. 60 tablet 0  . methocarbamol (ROBAXIN) 500 MG tablet Take 1 tablet (500 mg total) by mouth every 6 (six) hours as needed for muscle spasms. 75 tablet 0  . mometasone-formoterol (DULERA) 100-5 MCG/ACT AERO 2 puffs then rinse mouth twice daily maintenance controller (Patient taking differently: Inhale 1 puff into the lungs 2 (two) times daily. ) 1 Inhaler prn  . montelukast (SINGULAIR) 10 MG tablet Take 1 tablet (10 mg total) by mouth at bedtime. 30 tablet 11  . oxyCODONE (OXY IR/ROXICODONE) 5 MG immediate release tablet Take 1 tablet (5 mg total) by mouth every 6 (six) hours as needed for severe pain. 100 tablet 0  . PARoxetine (PAXIL) 20 MG tablet Take 20 mg by mouth at bedtime.     . polyethylene glycol (MIRALAX / GLYCOLAX) packet Take 17 g by mouth 2 (two) times daily. 100 each 0  . senna-docusate (SENOKOT-S) 8.6-50 MG per tablet Take 2 tablets by mouth 2 (two) times daily. 100 tablet 0  . aspirin EC 81 MG tablet Take 81 mg by mouth every morning.      Current Facility-Administered Medications on File Prior to Visit  Medication Dose Route Frequency Provider Last Rate Last Dose  . tranexamic acid (CYKLOKAPRON) 2,000 mg in sodium chloride 0.9 % 50 mL Topical Application  5,053 mg Topical Once Amber Lauren Cecilio Asper, PA-C        Allergies  Allergen Reactions  . Ceclor [Cefaclor] Other (See Comments)    Reaction=burning all over  . Other Anaphylaxis    BEE STINGS- CLOSE OFF THROAT  . Penicillins Anaphylaxis    "CLOSES OFF MY BREATHING"  . Pneumococcal Vaccines Other (See Comments)    PP-23 vaccine; had BIG local red reaction with heat.   . Codeine Nausea And Vomiting  . Cortisone Hives    All over  body  . Boniva [Ibandronate Sodium] Other (See Comments)    Jaw popping    Past Medical History  Diagnosis Date  . Insomnia, unspecified   . Esophageal reflux   . Hyperlipemia   . Asthma   . Osteoporosis   . Arthritis   . COPD (chronic obstructive pulmonary disease)   . Depression   . Personal history of colonic polyps 09/2010    TUBULAR ADENOMAS (X3); NEGATIVE FOR HIGH GRADE DYSPLASIA OR MALIGNANCY.  [name]  Barrett esophagus   . Hiatal hernia   . Esophageal stricture   . GERD (gastroesophageal reflux disease)   . GAD (generalized anxiety disorder)   . Aortic valve disorders   . Heart murmur   . Anxiety   . Shortness of breath     with exertion   . Pneumonia     hx of   . Type II or unspecified type diabetes mellitus without mention of complication, not stated as uncontrolled     on no meds   . Anemia     hx of years ago   . Hypertension 12/07/2012  . Myocardial infarction 01-08-13    '06-Chest pain-no stent-dx. MI-stress related  . History of kidney stones 01-08-13    past hx.  . Transfusion history 01-08-13    past hx. many yrs ago  . Dysrhythmia     heart skips per pt   . 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 Urinary frequency     AND INCONTINENCE    Past Surgical History  Procedure Laterality Date  . Cholecystectomy  2005  . Partial hysterectomy    . Shoulder surgery  2011  . Tubal ligation    . Tonsillectomy    . Foot surgery  2005    Left foot  . Wrist flexion tendon tenotomies and proximal corpectomy w/ wrist arthrodesis&iliac crest bone graft    . Cardiac catheterization      normal per Dr Acie Fredrickson in note dated 02/06/12   . Knee arthroscopy  02/28/2012    Procedure: ARTHROSCOPY KNEE;  Surgeon: Tobi Bastos, MD;  Location: [facility];  Service: Orthopedics;  Laterality: Left;  . Esophageal dilation  01-08-13    2 yrs ago  . Cataract extraction, bilateral    . Total knee arthroplasty Left 01/17/2013    Procedure: LEFT TOTAL KNEE  ARTHROPLASTY;  Surgeon: Tobi Bastos, MD;  Location: [facility];  Service: Orthopedics;  Laterality: Left;  . Total knee arthroplasty Right 06/19/2014    Procedure: RIGHT TOTAL KNEE ARTHROPLASTY;  Surgeon: Tobi Bastos, MD;  Location: [facility];  Service: Orthopedics;  Laterality: Right;    History  Smoking status  . Former Smoker -- 0.25 packs/day for 40 years  . Types: Cigarettes  . Quit date: 06/24/2013  Smokeless tobacco  . Former Systems developer  . Types: Snuff, Chew  . Quit date: 12/31/2011    Comment: quit 3 months ago    History  Alcohol Use  . 0.6 oz/week  . 1 Cans of beer per week    Comment: occasionally    Family History  Problem Relation Age of Onset  . Breast cancer Mother   . Diabetes Maternal Grandfather   . Kidney disease      Both sides of family  . Heart disease      Both sides of family  . Colon cancer Brother 29  . Colon polyps Brother   . Breast cancer Maternal Aunt   . Breast cancer Maternal Grandmother     Reviw of Systems:  Reviewed in the HPI.  All other systems are negative.  Physical Exam: Blood pressure 140/74, pulse 72, height 5' 0.5" (1.537 m), weight 176 lb 1.9 oz (79.888 kg), SpO2 95 %. General: Well developed, well nourished, in no acute distress.  Head: Normocephalic, atraumatic, sclera non-icteric, mucus membranes are moist,   Neck: Supple. Carotids are 2 + without bruits. No JVD  Lungs: Clear bilaterally  to auscultation.  Heart: regular rate.  normal  S1 S2. No murmurs, gallops or rubs.  Abdomen: Soft, non-tender, non-distended with normal bowel sounds. No hepatomegaly. No rebound/guarding. No masses.  Msk:  Strength and tone are normal  Extremities: No clubbing or cyanosis. No edema.  Distal pedal pulses are 2+ and equal bilaterally.  Neuro: Alert and oriented X 3. Moves all extremities spontaneously.  Psych:  Responds to questions appropriately with a normal affect.  ECG: Oct. 7, 2015:  Sinus brady at 56.  voltage for  LVH  Assessment / Plan:

## 2014-07-03 NOTE — Patient Instructions (Addendum)
Your physician has recommended you make the following change in your medication:  STOP Amiodarone once the bottle is empty START Carvedilol (Coreg) 3.125 mg twice daily - take 12 hours apart START Losartan 50 mg once daily   Your physician has requested that you have an echocardiogram 1 week before appointment with Dr. Acie Fredrickson. Echocardiography is a painless test that uses sound waves to create images of your heart. It provides your doctor with information about the size and shape of your heart and how well your heart's chambers and valves are working. This procedure takes approximately one hour. There are no restrictions for this procedure.  Your physician recommends that you schedule a follow-up appointment in: 3 months with Dr. Acie Fredrickson.

## 2014-07-03 NOTE — Assessment & Plan Note (Signed)
At developed acute systolic congestive heart failure following her leg surgery. She apparently had diffuse chronic spasm resulting in Takotsubo syndrome.  Her EF was low as 15-20%.    She has improved significantly and now her EF has normallized   She has maintained sinus rhythm.  We'll start her on carvedilol 3.125 mg twice a day and losartan 50 mg a day. She can discontinue the amiodarone when she runs out of his current bottle.  I'll see her again in 3 months for followup visit. We'll plan on getting an echocardiogram prior to that visit.

## 2014-07-04 ENCOUNTER — Telehealth: Payer: Self-pay | Admitting: Cardiovascular Disease

## 2014-07-04 NOTE — Telephone Encounter (Signed)
New Msg   Patient states her medication losartan potassium 50 mg tablets needs a pre authorization before it can be filled. [phone].

## 2014-07-04 NOTE — Telephone Encounter (Signed)
She has Medicaid. i have no idea how to do that prior auths.

## 2014-07-07 NOTE — Telephone Encounter (Signed)
Spoke with staff at [employer].  She advised that patient has picked up the medication but that PA is needed and if obtained, patient will be reimbursed.   I called [place] Medicaid [phone]) for prior authorization of Losartan 50 mg.  Representative took information and asked if patient has taken an Ace-inhibitor prior to being advised to start Losartan.  I advised that this is a new diagnosis of Acute Systolic CHF and this is recommended treatment plan per Dr. Acie Fredrickson.  Representative advised that approval or denial will be sent within 24 hours. Referral # [id number]

## 2014-07-08 ENCOUNTER — Telehealth: Payer: Self-pay | Admitting: Cardiovascular Disease

## 2014-07-08 DIAGNOSIS — R079 Chest pain, unspecified: Secondary | ICD-10-CM

## 2014-07-08 NOTE — Telephone Encounter (Signed)
New message   Patient called ems on Sunday after noon - sever chest pain. radiated to her back. Took 4 nitro tab of mother pills.     Did not go to emergency room.  Staying with her mother will be going home tomorrow. Does not have a prescription for nitro .

## 2014-07-08 NOTE — Telephone Encounter (Signed)
Spoke with patient who states she was having pain in her heart on Sunday that radiated to her back.  Patient states it was difficult for her to take a deep breath.  Patient states she took 2 NTG before EMS was called and then took 2 more while EMS was on the way. Patient states she was advised by EMS to go to the hospital but she did not go because she felt better by the time they had monitored her awhile.  Patient states she has not had any pain since that time.  I advised patient that a lexiscan myoview is needed to determine if pain could be cardiac.  Patient verbalized agreement to scheduled test.  I advised her someone from our office will call her tomorrow with an appointment and I reviewed the pre-procedure instructions with her on the telephone.  Patient verbalized understanding and agreement.

## 2014-07-08 NOTE — Telephone Encounter (Signed)
She has had a normal cath in the past. I do not think she needs NTG. If she continues to have CP or problems that sound like angina. She should call us. We can arrange for her to have a [employer]

## 2014-07-08 NOTE — Telephone Encounter (Signed)
Spoke with Tillie Rung, Physical Therapist with [facility] who states she called to inform us that patient had what she described as severe chest pain that radiated to her back on Sunday.  This information was reported to her when she called to schedule the patient's PT appointment for Monday.  Patient was not available for therapy until today.  Patient reports that she was with her mother on Sunday and took some of her mother's NTG tablets and called EMS.  Tillie Rung reports that patient was monitored by EMS and was advised to go to the hospital but she refused.  Tillie Rung states patient reports she was on NTG in the past but was taken off because she was told she no longer needed it.  She states that she worked out with the patient today and that the patient's mother reported the patient was very pale on Sunday night when this episode occurred; no complaints today. Tillie Rung states she is concerned because patient lives alone and patient informed her that she had not called our office to report this episode.  I thanked Tillie Rung for the call and advised I will review with Dr. Acie Fredrickson and call the patient with his advice.  Tillie Rung thanked me.

## 2014-07-08 NOTE — Telephone Encounter (Signed)
Also, Tillie Rung reports patient's weight is stable, she has not had any weight gain and she denies SOB and edema

## 2014-07-14 DIAGNOSIS — Z471 Aftercare following joint replacement surgery: Secondary | ICD-10-CM | POA: Diagnosis not present

## 2014-07-14 DIAGNOSIS — F329 Major depressive disorder, single episode, unspecified: Secondary | ICD-10-CM

## 2014-07-14 DIAGNOSIS — Z7901 Long term (current) use of anticoagulants: Secondary | ICD-10-CM

## 2014-07-14 DIAGNOSIS — Z4801 Encounter for change or removal of surgical wound dressing: Secondary | ICD-10-CM

## 2014-07-14 DIAGNOSIS — I4891 Unspecified atrial fibrillation: Secondary | ICD-10-CM | POA: Diagnosis not present

## 2014-07-14 DIAGNOSIS — E119 Type 2 diabetes mellitus without complications: Secondary | ICD-10-CM | POA: Diagnosis not present

## 2014-07-14 DIAGNOSIS — Z96651 Presence of right artificial knee joint: Secondary | ICD-10-CM | POA: Diagnosis not present

## 2014-07-14 DIAGNOSIS — I5181 Takotsubo syndrome: Secondary | ICD-10-CM

## 2014-07-14 DIAGNOSIS — Z5181 Encounter for therapeutic drug level monitoring: Secondary | ICD-10-CM

## 2014-07-15 ENCOUNTER — Ambulatory Visit (HOSPITAL_COMMUNITY): Payer: Medicaid Other | Attending: Cardiology | Admitting: Radiology

## 2014-07-15 DIAGNOSIS — R079 Chest pain, unspecified: Secondary | ICD-10-CM | POA: Diagnosis not present

## 2014-07-15 DIAGNOSIS — J45909 Unspecified asthma, uncomplicated: Secondary | ICD-10-CM | POA: Diagnosis not present

## 2014-07-15 DIAGNOSIS — R0600 Dyspnea, unspecified: Secondary | ICD-10-CM

## 2014-07-15 DIAGNOSIS — E119 Type 2 diabetes mellitus without complications: Secondary | ICD-10-CM | POA: Insufficient documentation

## 2014-07-15 DIAGNOSIS — Z6829 Body mass index (BMI) 29.0-29.9, adult: Secondary | ICD-10-CM | POA: Diagnosis not present

## 2014-07-15 DIAGNOSIS — R002 Palpitations: Secondary | ICD-10-CM | POA: Diagnosis not present

## 2014-07-15 DIAGNOSIS — I2510 Atherosclerotic heart disease of native coronary artery without angina pectoris: Secondary | ICD-10-CM | POA: Insufficient documentation

## 2014-07-15 DIAGNOSIS — J449 Chronic obstructive pulmonary disease, unspecified: Secondary | ICD-10-CM | POA: Insufficient documentation

## 2014-07-15 DIAGNOSIS — I10 Essential (primary) hypertension: Secondary | ICD-10-CM | POA: Diagnosis not present

## 2014-07-15 MED ORDER — TECHNETIUM TC 99M SESTAMIBI GENERIC - CARDIOLITE
33.0000 | Freq: Once | INTRAVENOUS | Status: AC | PRN
  Administered 2014-07-15: 33 via INTRAVENOUS

## 2014-07-15 MED ORDER — REGADENOSON 0.4 MG/5ML IV SOLN
0.4000 mg | Freq: Once | INTRAVENOUS | Status: AC
  Administered 2014-07-15: 0.4 mg via INTRAVENOUS

## 2014-07-15 MED ORDER — TECHNETIUM TC 99M SESTAMIBI GENERIC - CARDIOLITE
11.0000 | Freq: Once | INTRAVENOUS | Status: AC | PRN
  Administered 2014-07-15: 11 via INTRAVENOUS

## 2014-07-15 NOTE — Progress Notes (Signed)
[facility] [address] [place], [place] [address] [phone]    Cardiology Nuclear Med Study  KESSLER KOPINSKI is a 63 y.o. female     MRN : [id number]     DOB: Feb 16, 1951  Procedure Date: 07/15/2014  Nuclear Med Background Indication for Stress Test:  Evaluation for Ischemia and follow- up CAD History: CAD 01/2203 Normal,EF:63%,Asthma;COPD Cardiac Risk Factors: Hypertension and NIDDM  Symptoms:  Chest Pain, DOE and Palpitations   Nuclear Pre-Procedure Caffeine/Decaff Intake:  None >12 hrs NPO After: 9:00pm   Lungs:  clear O2 Sat: 98% on room air. IV 0.9% NS with Angio Cath:  22g  IV Site: R Antecubital x 1, tolerated well IV Started by:  Irven Baltimore, RN  Chest Size (in):  44 Cup Size: DD  Height: 5\' 5"  (1.651 m)  Weight:  175 lb (79.379 kg)  BMI:  Body mass index is 29.12 kg/(m^2). Tech Comments:  No Metformin this am;Coreg last pm. Irven Baltimore, RN    Nuclear Med Study 1 or 2 day study: 1 day  Stress Test Type:  Carlton Adam  Reading MD: n/a  Order Authorizing Provider:  Mertie Moores, MD  Resting Radionuclide: Technetium 75m Sestamibi  Resting Radionuclide Dose: 11.0 mCi   Stress Radionuclide:  Technetium 66m Sestamibi  Stress Radionuclide Dose: 33.0 mCi           Stress Protocol Rest HR: 55 Stress HR: 78  Rest BP: 113/53 Stress BP: 125/43  Exercise Time (min): n/a METS: n/a   Predicted Max HR: 159 bpm % Max HR: 49.06 bpm Rate Pressure Product: 9750   Dose of Adenosine (mg):  n/a Dose of Lexiscan: 0.4 mg  Dose of Atropine (mg): n/a Dose of Dobutamine: n/a mcg/kg/min (at max HR)  Stress Test Technologist: Ileene Hutchinson, EMT-P  Nuclear Technologist:  Earl Many, CNMT     Rest Procedure:  Myocardial perfusion imaging was performed at rest 4 hours post stress injection by intravenous administration of Thallous Chloride TI201, wait 20-30 minutes, and image. Rest ECG: Sinus bradycardia; anterior lateral TWI.  Stress  Procedure:  The patient received IV Lexiscan 0.4 mg over 15-seconds.  Technetium 64m Sestamibi injected at 30-seconds.  Quantitative spect images were obtained after a 45 minute delay. Stress ECG: No significant ST segment change suggestive of ischemia.  QPS Raw Data Images:  Acquisition technically good; normal left ventricular size. Stress Images:  There is decreased uptake in the distal anterior wall and apex. Rest Images:  There is decreased uptake in the distal anterior wall and apex, less prominent compared to the stress images. Subtraction (SDS):  These findings are consistent with possible soft tissue attenuation; cannot exclude mild ischemia. Transient Ischemic Dilatation (Normal <1.22):  1.14 Lung/Heart Ratio (Normal <0.45):  0.37  Quantitative Gated Spect Images QGS EDV:  94 ml QGS ESV:  36 ml  Impression Exercise Capacity:  Lexiscan with no exercise. BP Response:  Normal blood pressure response. Clinical Symptoms:  There is chest tightness and dyspnea. ECG Impression:  No significant ST segment change suggestive of ischemia. Comparison with Prior Nuclear Study: No previous nuclear study performed  Overall Impression:  Low risk stress nuclear study with a small, moderate intensity, partially reversible distal anterior and apical defect possibly related to shifting breast attenuation; cannot exclude mild ischemia.  LV Ejection Fraction: 61%.  LV Wall Motion:  NL LV Function; NL Wall Motion    [name] [name]

## 2014-07-29 NOTE — Telephone Encounter (Signed)
Prior Authorization for Losartan denied by Medicaid. Patient is required to try and fail an ACE inhibitor unless it is contraindicated or has experienced an adverse event when using a preferred product.

## 2014-07-30 ENCOUNTER — Telehealth: Payer: Self-pay | Admitting: Internal Medicine

## 2014-07-30 NOTE — Telephone Encounter (Signed)
lmomtcb x1 

## 2014-07-31 NOTE — Telephone Encounter (Signed)
lmomtcb for pt 

## 2014-08-04 MED ORDER — VARENICLINE TARTRATE 1 MG PO TABS: 1.0000 mg | ORAL_TABLET | Freq: Two times a day (BID) | ORAL | Status: DC

## 2014-08-04 NOTE — Telephone Encounter (Signed)
Last OV 05-07-14. Pt is requesting a refill on chantix continuing pack. Last rx written on 05-07-14. Pt states she has completed 2 rounds of this medication. She is asking for an rx with additional refills. How long do you want the pt to continue this medication? [place] Bing, CMA  Allergies  Allergen Reactions  . Ceclor [Cefaclor] Other (See Comments)    Reaction=burning all over  . Other Anaphylaxis    BEE STINGS- CLOSE OFF THROAT  . Penicillins Anaphylaxis    "CLOSES OFF MY BREATHING"  . Pneumococcal Vaccines Other (See Comments)    PP-23 vaccine; had BIG local red reaction with heat.   . Codeine Nausea And Vomiting  . Cortisone Hives    All over body  . Boniva [Ibandronate Sodium] Other (See Comments)    Jaw popping   Current Outpatient Prescriptions on File Prior to Visit  Medication Sig Dispense Refill  . acetaminophen (TYLENOL) 325 MG tablet Take 1-2 tablets (325-650 mg total) by mouth every 4 (four) hours as needed for mild pain.    [name] albuterol (PROVENTIL HFA;VENTOLIN HFA) 108 (90 BASE) MCG/ACT inhaler Inhale 2 puffs into the lungs every 4 (four) hours as needed for wheezing or shortness of breath. Take two puffs four times a day as needed 1 Inhaler prn  . albuterol (PROVENTIL) (2.5 MG/3ML) 0.083% nebulizer solution Take 3 mLs (2.5 mg total) by nebulization every 6 (six) hours as needed for wheezing. DX  491.9 150 mL 12  . ALPRAZolam (XANAX) 0.5 MG tablet Take 0.5 mg by mouth 3 (three) times daily.    [name] apixaban (ELIQUIS) 5 MG TABS tablet Take 1 tablet (5 mg total) by mouth 2 (two) times daily. 60 tablet 1  . aspirin EC 81 MG tablet Take 81 mg by mouth every morning.     [name] atorvastatin (LIPITOR) 40 MG tablet Take 1 tablet (40 mg total) by mouth at bedtime. 30 tablet 0  . calcium carbonate (OS-CAL) 600 MG TABS Take 600 mg by mouth at bedtime.     . carvedilol (COREG) 3.125 MG tablet Take 1 tablet (3.125 mg total) by mouth 2 (two) times daily. 180 tablet 3  . CHANTIX CONTINUING  MONTH PAK 1 MG tablet Take 1 mg by mouth 2 (two) times daily.  2  . CHANTIX STARTING MONTH PAK 0.5 MG X 11 & 1 MG X 42 tablet TAKE AS DIRECTED 53 tablet 0  . ciprofloxacin (CIPRO) 250 MG tablet Take 1 tablet (250 mg total) by mouth 2 (two) times daily. 20 tablet 0  . dexlansoprazole (DEXILANT) 60 MG capsule Take 1 capsule (60 mg total) by mouth every morning. 30 capsule 5  . docusate sodium (COLACE) 100 MG capsule Take 100 mg by mouth 2 (two) times daily.    [name] EPINEPHrine 0.3 mg/0.3 mL IJ SOAJ injection Inject 0.3 mLs (0.3 mg total) into the muscle once. (Patient taking differently: Inject 0.3 mg into the muscle as needed. ) 1 Device 1  . ferrous sulfate 325 (65 FE) MG tablet Take 1 tablet (325 mg total) by mouth 2 (two) times daily. 60 tablet 1  . fluticasone (FLONASE) 50 MCG/ACT nasal spray Place 2 sprays into the nose daily. (Patient taking differently: Place 1 spray into the nose 2 (two) times daily. ) 16 g prn  . furosemide (LASIX) 40 MG tablet Take 40 mg by mouth daily.    [name] losartan (COZAAR) 50 MG tablet Take 1 tablet (50 mg total) by mouth daily. 90 tablet 3  .  Menthol (HONEY LEMON COUGH DROPS MT) Use as directed in the mouth or throat. Taking approx. 8-9 daily    . metFORMIN (GLUCOPHAGE) 500 MG tablet Take 1 tablet (500 mg total) by mouth 2 (two) times daily. 60 tablet 0  . methocarbamol (ROBAXIN) 500 MG tablet Take 1 tablet (500 mg total) by mouth every 6 (six) hours as needed for muscle spasms. 75 tablet 0  . mometasone-formoterol (DULERA) 100-5 MCG/ACT AERO 2 puffs then rinse mouth twice daily maintenance controller (Patient taking differently: Inhale 1 puff into the lungs 2 (two) times daily. ) 1 Inhaler prn  . montelukast (SINGULAIR) 10 MG tablet Take 1 tablet (10 mg total) by mouth at bedtime. 30 tablet 11  . oxyCODONE (OXY IR/ROXICODONE) 5 MG immediate release tablet Take 1 tablet (5 mg total) by mouth every 6 (six) hours as needed for severe pain. 100 tablet 0  . PARoxetine (PAXIL)  20 MG tablet Take 20 mg by mouth at bedtime.     . polyethylene glycol (MIRALAX / GLYCOLAX) packet Take 17 g by mouth 2 (two) times daily. 100 each 0  . senna-docusate (SENOKOT-S) 8.6-50 MG per tablet Take 2 tablets by mouth 2 (two) times daily. 100 tablet 0   Current Facility-Administered Medications on File Prior to Visit  Medication Dose Route Frequency Provider Last Rate Last Dose  . tranexamic acid (CYKLOKAPRON) 2,000 mg in sodium chloride 0.9 % 50 mL Topical Application  5,056 mg Topical Once Amber Renelda Loma, PA-C

## 2014-08-04 NOTE — Telephone Encounter (Signed)
Rx sent to [facility]. lmomtcb to inform pt

## 2014-08-04 NOTE — Telephone Encounter (Signed)
Ok Chantix 1 mg, # 60, ref x3

## 2014-08-05 NOTE — Telephone Encounter (Signed)
lmomtcb for pt 

## 2014-08-06 NOTE — Telephone Encounter (Signed)
Called spoke with patient and informed her of her pending rx Pt voiced her understanding and denied any further needs at this time Will sign off

## 2014-08-08 ENCOUNTER — Other Ambulatory Visit: Payer: Self-pay | Admitting: General Practice

## 2014-08-27 ENCOUNTER — Ambulatory Visit (HOSPITAL_COMMUNITY): Payer: Medicaid Other | Attending: Cardiovascular Disease

## 2014-08-27 DIAGNOSIS — I4891 Unspecified atrial fibrillation: Secondary | ICD-10-CM | POA: Insufficient documentation

## 2014-08-27 DIAGNOSIS — Z720 Tobacco use: Secondary | ICD-10-CM | POA: Insufficient documentation

## 2014-08-27 DIAGNOSIS — E785 Hyperlipidemia, unspecified: Secondary | ICD-10-CM | POA: Diagnosis not present

## 2014-08-27 DIAGNOSIS — E119 Type 2 diabetes mellitus without complications: Secondary | ICD-10-CM | POA: Insufficient documentation

## 2014-08-27 DIAGNOSIS — M1990 Unspecified osteoarthritis, unspecified site: Secondary | ICD-10-CM | POA: Insufficient documentation

## 2014-08-27 DIAGNOSIS — I420 Dilated cardiomyopathy: Secondary | ICD-10-CM | POA: Insufficient documentation

## 2014-08-27 DIAGNOSIS — I10 Essential (primary) hypertension: Secondary | ICD-10-CM | POA: Insufficient documentation

## 2014-08-27 DIAGNOSIS — I5021 Acute systolic (congestive) heart failure: Secondary | ICD-10-CM | POA: Diagnosis present

## 2014-08-27 DIAGNOSIS — Z8673 Personal history of transient ischemic attack (TIA), and cerebral infarction without residual deficits: Secondary | ICD-10-CM | POA: Diagnosis not present

## 2014-08-27 NOTE — Progress Notes (Signed)
2D Echo completed. 08/27/2014 

## 2014-09-05 ENCOUNTER — Ambulatory Visit (INDEPENDENT_AMBULATORY_CARE_PROVIDER_SITE_OTHER): Payer: Medicaid Other | Admitting: Cardiovascular Disease

## 2014-09-05 ENCOUNTER — Encounter: Payer: Self-pay | Admitting: Cardiovascular Disease

## 2014-09-05 VITALS — BP 112/62 | HR 58 | Ht 60.5 in | Wt 169.1 lb

## 2014-09-05 DIAGNOSIS — I5021 Acute systolic (congestive) heart failure: Secondary | ICD-10-CM

## 2014-09-05 DIAGNOSIS — I5181 Takotsubo syndrome: Secondary | ICD-10-CM

## 2014-09-05 LAB — BASIC METABOLIC PANEL
BUN: 12 mg/dL (ref 6–23)
CHLORIDE: 103 meq/L (ref 96–112)
CO2: 30 meq/L (ref 19–32)
CREATININE: 0.91 mg/dL (ref 0.40–1.20)
Calcium: 9.4 mg/dL (ref 8.4–10.5)
GFR: 66.62 mL/min (ref >60.00)
Glucose, Bld: 172 mg/dL — ABNORMAL HIGH (ref 70–99)
Potassium: 3.6 mEq/L (ref 3.5–5.1)
Sodium: 138 mEq/L (ref 135–145)

## 2014-09-05 NOTE — Patient Instructions (Signed)
Your physician recommends that you continue on your current medications as directed. Please refer to the Current Medication list given to you today.  Your physician recommends that you have lab work:  TODAY - basic metabolic panel  Your physician wants you to follow-up in: 6 months with Dr. Acie Fredrickson.  You will receive a reminder letter in the mail two months in advance. If you don't receive a letter, please call our office to schedule the follow-up appointment.

## 2014-09-05 NOTE — Progress Notes (Signed)
Cardiology Office Note   Date:  09/05/2014   ID:  [name], [name] 19-Aug-1950, MRN [id number]  PCP:  [name] Hatchet, MD  Cardiologist:   Thayer Headings, MD   Chief Complaint  Patient presents with  . Follow-up    chest pain    1. Takotsubo Syndrome  2. Normal heart catheterization 2006 3. Hyperlipidemia 4. Left knee pain   History of Present Illness: [name] [name] is a 64 y.o. female who presents for follow up of her Takotsubo syndrome.   At developed acute systolic congestive heart failure following her leg surgery in Nov, 2015.  [name] [name] She apparently had diffuse chronic spasm resulting in Takotsubo syndrome. Her EF was low as 15-20%. She has improved significantly and now her EF has normallized  She has maintained sinus rhythm.  We started  her on carvedilol 3.125 mg twice a day and losartan 50 mg a day in Dec. 2015.  [name] [name]  She has done very well.   No dyspnea , no CP .    Past Medical History  Diagnosis Date  . Insomnia, unspecified   . Esophageal reflux   . Hyperlipemia   . Asthma   . Osteoporosis   . Arthritis   . COPD (chronic obstructive pulmonary disease)   . Depression   . Personal history of colonic polyps 09/2010    TUBULAR ADENOMAS (X3); NEGATIVE FOR HIGH GRADE DYSPLASIA OR MALIGNANCY.  . Barrett esophagus   . Hiatal hernia   . Esophageal stricture   . GERD (gastroesophageal reflux disease)   . GAD (generalized anxiety disorder)   . Aortic valve disorders   . Heart murmur   . Anxiety   . Shortness of breath     with exertion   . Pneumonia     hx of   . Type II or unspecified type diabetes mellitus without mention of complication, not stated as uncontrolled     on no meds   . Anemia     hx of years ago   . Hypertension 12/07/2012  . Myocardial infarction 01-08-13    '06-Chest pain-no stent-dx. MI-stress related  . History of kidney stones 01-08-13    past hx.  . Transfusion history 01-08-13    past hx. many yrs ago  . Dysrhythmia    heart skips per pt   . 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 Urinary frequency     AND INCONTINENCE    Past Surgical History  Procedure Laterality Date  . Cholecystectomy  2005  . Partial hysterectomy    . Shoulder surgery  2011  . Tubal ligation    . Tonsillectomy    . Foot surgery  2005    Left foot  . Wrist flexion tendon tenotomies and proximal corpectomy w/ wrist arthrodesis&iliac crest bone graft    . Cardiac catheterization      normal per Dr Acie Fredrickson in note dated 02/06/12   . Knee arthroscopy  02/28/2012    Procedure: ARTHROSCOPY KNEE;  Surgeon: Tobi Bastos, MD;  Location: [facility];  Service: Orthopedics;  Laterality: Left;  . Esophageal dilation  01-08-13    2 yrs ago  . Cataract extraction, bilateral    . Total knee arthroplasty Left 01/17/2013    Procedure: LEFT TOTAL KNEE ARTHROPLASTY;  Surgeon: Tobi Bastos, MD;  Location: [facility];  Service: Orthopedics;  Laterality: Left;  . Total knee arthroplasty Right 06/19/2014  Procedure: RIGHT TOTAL KNEE ARTHROPLASTY;  Surgeon: Tobi Bastos, MD;  Location: [facility];  Service: Orthopedics;  Laterality: Right;     Current Outpatient Prescriptions  Medication Sig Dispense Refill  . acetaminophen (TYLENOL) 325 MG tablet Take 1-2 tablets (325-650 mg total) by mouth every 4 (four) hours as needed for mild pain.    [name] [name] albuterol (PROVENTIL HFA;VENTOLIN HFA) 108 (90 BASE) MCG/ACT inhaler Inhale 2 puffs into the lungs every 4 (four) hours as needed for wheezing or shortness of breath. Take two puffs four times a day as needed 1 Inhaler prn  . albuterol (PROVENTIL) (2.5 MG/3ML) 0.083% nebulizer solution Take 3 mLs (2.5 mg total) by nebulization every 6 (six) hours as needed for wheezing. DX  491.9 150 mL 12  . ALPRAZolam (XANAX) 0.5 MG tablet Take 0.5 mg by mouth 3 (three) times daily.    [name] [name] aspirin EC 81 MG tablet Take 81 mg by mouth every morning.     [name] [name] atorvastatin (LIPITOR) 40 MG tablet Take 1 tablet (40 mg  total) by mouth at bedtime. 30 tablet 0  . calcium carbonate (OS-CAL) 600 MG TABS Take 600 mg by mouth at bedtime.     . carvedilol (COREG) 3.125 MG tablet Take 1 tablet (3.125 mg total) by mouth 2 (two) times daily. 180 tablet 3  . CHANTIX STARTING MONTH PAK 0.5 MG X 11 & 1 MG X 42 tablet TAKE AS DIRECTED 53 tablet 0  . cholecalciferol (VITAMIN D) 1000 UNITS tablet Take 1,000 Units by mouth daily.    [name] [name] dexlansoprazole (DEXILANT) 60 MG capsule Take 1 capsule (60 mg total) by mouth every morning. 30 capsule 5  . docusate sodium (COLACE) 100 MG capsule Take 100 mg by mouth 2 (two) times daily.    [name] [name] EPINEPHrine 0.3 mg/0.3 mL IJ SOAJ injection Inject 0.3 mLs (0.3 mg total) into the muscle once. (Patient taking differently: Inject 0.3 mg into the muscle as needed. ) 1 Device 1  . ferrous sulfate 325 (65 FE) MG tablet Take 1 tablet (325 mg total) by mouth 2 (two) times daily. 60 tablet 1  . fluticasone (FLONASE) 50 MCG/ACT nasal spray Place 2 sprays into the nose daily. (Patient taking differently: Place 1 spray into the nose 2 (two) times daily. ) 16 g prn  . furosemide (LASIX) 40 MG tablet Take 40 mg by mouth daily.    [name] [name] losartan (COZAAR) 50 MG tablet Take 1 tablet (50 mg total) by mouth daily. 90 tablet 3  . Menthol (HONEY LEMON COUGH DROPS MT) Use as directed in the mouth or throat. Taking approx. 8-9 daily    . metFORMIN (GLUCOPHAGE) 500 MG tablet Take 1 tablet (500 mg total) by mouth 2 (two) times daily. 60 tablet 0  . methocarbamol (ROBAXIN) 500 MG tablet Take 1 tablet (500 mg total) by mouth every 6 (six) hours as needed for muscle spasms. 75 tablet 0  . mometasone-formoterol (DULERA) 100-5 MCG/ACT AERO 2 puffs then rinse mouth twice daily maintenance controller (Patient taking differently: Inhale 1 puff into the lungs 2 (two) times daily. ) 1 Inhaler prn  . montelukast (SINGULAIR) 10 MG tablet Take 1 tablet (10 mg total) by mouth at bedtime. 30 tablet 11  . PARoxetine (PAXIL) 20 MG tablet Take 20  mg by mouth at bedtime.     . polyethylene glycol (MIRALAX / GLYCOLAX) packet Take 17 g by mouth 2 (two) times daily. 100 each 0  . senna-docusate (SENOKOT-S) 8.6-50 MG per tablet Take 2  tablets by mouth 2 (two) times daily. 100 tablet 0  . varenicline (CHANTIX CONTINUING MONTH PAK) 1 MG tablet Take 1 tablet (1 mg total) by mouth 2 (two) times daily. 60 tablet 3  . vitamin B-12 (CYANOCOBALAMIN) 250 MCG tablet Take 250 mcg by mouth daily.     No current facility-administered medications for this visit.   Facility-Administered Medications Ordered in Other Visits  Medication Dose Route Frequency Provider Last Rate Last Dose  . tranexamic acid (CYKLOKAPRON) 2,000 mg in sodium chloride 0.9 % 50 mL Topical Application  9,150 mg Topical Once Amber Lauren Cecilio Asper, PA-C        Allergies:   Ceclor; Other; Penicillins; Pneumococcal vaccines; Codeine; Cortisone; and Boniva    Social History:  The patient  reports that she quit smoking about 14 months ago. Her smoking use included Cigarettes. She has a 10 pack-year smoking history. She quit smokeless tobacco use about 2 years ago. Her smokeless tobacco use included Snuff and Chew. She reports that she drinks about 0.6 oz of alcohol per week. She reports that she does not use illicit drugs.   Family History:  The patient's family history includes Breast cancer in her maternal aunt, maternal grandmother, and mother; Colon cancer (age of onset: 18) in her brother; Colon polyps in her brother; Diabetes in her maternal grandfather; Heart disease in an other family member; Kidney disease in an other family member.    ROS:  Please see the history of present illness.    Review of Systems: Constitutional:  denies fever, chills, diaphoresis, appetite change and fatigue.  HEENT: denies photophobia, eye pain, redness, hearing loss, ear pain, congestion, sore throat, rhinorrhea, sneezing, neck pain, neck stiffness and tinnitus.  Respiratory: denies SOB, DOE,  cough, chest tightness, and wheezing.  Cardiovascular: denies chest pain, palpitations and leg swelling.  Gastrointestinal: denies nausea, vomiting, abdominal pain, diarrhea, constipation, blood in stool.  Genitourinary: denies dysuria, urgency, frequency, hematuria, flank pain and difficulty urinating.  Musculoskeletal: denies  myalgias, back pain, joint swelling, arthralgias and gait problem.   Skin: denies pallor, rash and wound.  Neurological: denies dizziness, seizures, syncope, weakness, light-headedness, numbness and headaches.   Hematological: denies adenopathy, easy bruising, personal or family bleeding history.  Psychiatric/ Behavioral: denies suicidal ideation, mood changes, confusion, nervousness, sleep disturbance and agitation.       All other systems are reviewed and negative.    PHYSICAL EXAM: VS:  BP 112/62 mmHg  Pulse 58  Ht 5' 0.5" (1.537 m)  Wt 169 lb 1.9 oz (76.712 kg)  BMI 32.47 kg/m2 , BMI Body mass index is 32.47 kg/(m^2). GEN: Well nourished, well developed, in no acute distress HEENT: normal Neck: no JVD, carotid bruits, or masses Cardiac: RRR; no murmurs, rubs, or gallops,no edema  Respiratory:  clear to auscultation bilaterally, normal work of breathing GI: soft, nontender, nondistended, + BS MS: no deformity or atrophy Skin: warm and dry, no rash Neuro:  Strength and sensation are intact Psych: normal   EKG:  EKG is not ordered today.   Recent Labs: 06/19/2014: Pro B Natriuretic peptide (BNP) 108.0 06/22/2014: TSH 3.120 06/23/2014: Magnesium 2.1 06/25/2014: ALT 95*; BUN 7; Creatinine 0.60; Hemoglobin 8.9*; Platelets 233; Potassium 3.9; Sodium 134*    Lipid Panel    Component Value Date/Time   TRIG 147 12/07/2012 1445   LDLCALC 60 12/07/2012 1445      Wt Readings from Last 3 Encounters:  09/05/14 169 lb 1.9 oz (76.712 kg)  07/15/14 175 lb (79.379 kg)  07/03/14 176 lb 1.9 oz (79.888 kg)      Other studies Reviewed: Additional  studies/ records that were reviewed today include: . Review of the above records demonstrates:    ASSESSMENT AND PLAN:  1. Takotsubo Syndrome -  She developed diffuse coronary  spasm and Takotsubo  syndrome following her knee surgery in November. Her ejection fraction was as low as 15%. She has recovered quite nicely. She's now on carvedilol and losartan. She seems to be feeling quite a bit better. Her ejection fraction has now normalized. We'll continue with supportive care. I'll see her in 6 months.  2. Normal heart catheterization 2006  3. Hyperlipidemia -  Managed by her primary medical doctor. 4. Left knee pain   Current medicines are reviewed at length with the patient today.  The patient does not have concerns regarding medicines.  The following changes have been made:  no change   Disposition:   FU with 6 months     Signed, Nahser, Wonda Cheng, MD  09/05/2014 3:20 PM    [facility], [place], [place]  [address] Phone: [phone]; Fax: [phone]

## 2014-10-04 ENCOUNTER — Other Ambulatory Visit: Payer: Self-pay | Admitting: General Practice

## 2014-11-05 ENCOUNTER — Other Ambulatory Visit: Payer: Self-pay | Admitting: Nurse Practitioner

## 2014-11-06 ENCOUNTER — Encounter: Payer: Self-pay | Admitting: Internal Medicine

## 2014-11-06 ENCOUNTER — Ambulatory Visit (INDEPENDENT_AMBULATORY_CARE_PROVIDER_SITE_OTHER): Payer: Medicaid Other | Admitting: Internal Medicine

## 2014-11-06 VITALS — BP 122/70 | HR 55 | Ht 60.5 in | Wt 177.4 lb

## 2014-11-06 DIAGNOSIS — J3089 Other allergic rhinitis: Secondary | ICD-10-CM

## 2014-11-06 DIAGNOSIS — J309 Allergic rhinitis, unspecified: Secondary | ICD-10-CM | POA: Diagnosis not present

## 2014-11-06 DIAGNOSIS — J302 Other seasonal allergic rhinitis: Secondary | ICD-10-CM

## 2014-11-06 DIAGNOSIS — J42 Unspecified chronic bronchitis: Secondary | ICD-10-CM | POA: Diagnosis not present

## 2014-11-06 DIAGNOSIS — J209 Acute bronchitis, unspecified: Secondary | ICD-10-CM

## 2014-11-06 MED ORDER — METHYLPREDNISOLONE ACETATE 80 MG/ML IJ SUSP
80.0000 mg | Freq: Once | INTRAMUSCULAR | Status: AC
  Administered 2014-11-06: 80 mg via INTRAMUSCULAR

## 2014-11-06 MED ORDER — MOMETASONE FURO-FORMOTEROL FUM 200-5 MCG/ACT IN AERO: INHALATION_SPRAY | RESPIRATORY_TRACT | Status: DC

## 2014-11-06 MED ORDER — EPINEPHRINE 0.3 MG/0.3ML IJ SOAJ: 0.3000 mg | INTRAMUSCULAR | Status: DC | PRN

## 2014-11-06 MED ORDER — LEVALBUTEROL HCL 0.63 MG/3ML IN NEBU
0.6300 mg | INHALATION_SOLUTION | Freq: Once | RESPIRATORY_TRACT | Status: AC
  Administered 2014-11-06: 0.63 mg via RESPIRATORY_TRACT

## 2014-11-06 MED ORDER — MOMETASONE FURO-FORMOTEROL FUM 200-5 MCG/ACT IN AERO: 2.0000 | INHALATION_SPRAY | Freq: Two times a day (BID) | RESPIRATORY_TRACT | Status: DC

## 2014-11-06 NOTE — Progress Notes (Signed)
07/24/12- 63 yoF former smoker (06/24/12), former patient at old office,  Ref By Dr. Myers/ [facility] -- pt reports having prod cough w thick green mucus, pnd, chest tightness x4 weeks occuring mostly w weather change She reports cough and chest congestion off and on for years. Says she just stopped smoking in November of 2013. Now green sputum for 2 or 3 weeks with scratchy throat but no fever, blood or pain. Some cough even on good days. Occasional wheeze and she uses her rescue inhaler between 3 and 6 times daily. History of an allergic component and was on allergy vaccine years ago. Reports urticaria last week after steroid injection in her knee and shoulder, and also had flu vaccine about the same time. She treated herself with Benadryl successfully. She remains on Benadryl 50 mg twice daily. Has had hives before and "nervous". Several episodes of pneumonia. Aware of GERD symptoms despite Dexilant. CXR- 02/22/12 image reviewed IMPRESSION:  Negative exam.  Original Report Authenticated By: [name], M.D.   09/04/12- 31 yoF former smoker (06/24/12), former patient at old office,  Follows For: 4 week f/u - Non prod cough - Sinus and chest congestion - SOB worse with weather changes - Chest tightness and wheezing - Out of Advair for 2 weeks Advair sample helped. Dry cough. Postnasal drip with yellow nasal discharge. Hives resolved. Nebulizer does help. CXR 09/04/12 IMPRESSION:  No acute disease.  Original Report Authenticated By: [name], M.D.  11/05/12- 80 yoF former smoker (06/24/12), former patient at old office, followed for chronic bronchitis. FOLLOWS FOR: PFTs today-- pt reports breathing is "slightly worse" d/t weather, gets SOB easier, prod cough at times-- denies any other concerns at this time [name] sample worked well instead of advair. LOV nebulizer and depomedrol helped " a bunch", as did Tessalon for cough. Still smoking 4-5 cigarettes/ day and resists blaming this for any  airway problems. [name]. Also blames oil heat and lack of airconditioning in her home. Asks letter stating those are aggravating her breathing. Says she looks for opportunities to be out of house. CXR 09/04/12 IMPRESSION:  No acute disease.  Original Report Authenticated By: [name], M.D. PFT- 11/05/12- Normal except mild reduction of DLCO. No response to bronchodilator. FVC 2.17/ 83%, FEV1 1.76/ 91%, FEV1/FVC 0.81, TLC 93%, DLCO 68%.  05/07/13- 79 yoF former smoker (06/24/12), former patient at old office, followed for chronic bronchitis FOLLOWS ZOX:WRUEA more sob since last night,chest tightness mid chest, cough at hs -yellow green,denies wheezing and chest pain,says Dulera worked well,needs refills for [employer] Mostly coughs with colds. Admits he still smokes an occasional cigarette and we discussed smoking cessation.  11/06/13-  3 yoF former smoker (06/24/12), former patient at old office, followed for chronic bronchitis, complicated by DM2, hx CVA FOLLOWS FOR: Breathing is worse since season changed. Pollen is making things worse. Reports SOB, coughing with production of yellow/green mucus. 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 Chest tight.  05/07/14- 89 yoF former smoker (06/24/12), , followed for chronic bronchitis, allergic rhinitis complicated by DM2, hx CVA FOLLOW FOR: Chronic Bronchitis; currently on abx for inner ear infection; wants to discuss going on Singular for allergies;  sinus and chest congestion; non-productive cough  11/06/14-  63 yoF former smoker, , followed for chronic bronchitis, allergic rhinitis complicated by DM2, hx CVA FOLLOWS FOR: Pt states she is having slight chest tightness. Blames pollen for stuffy nose and chest tightness with mild wheeze, dyspnea on exertion. No chest pain or palpitation.  Quit smoking 6 months ago! She is remarried CXR 06/21/14 IMPRESSION: Continued stable interstitial prominence throughout the  lungs. Electronically Signed  By: Rolm Baptise M.D.  On: 06/21/2014 11:09  ROS-see HPI Constitutional:   No-   weight loss, night sweats, fevers, chills, fatigue, lassitude. HEENT:   +  headaches, difficulty swallowing, tooth/dental problems, sore throat,       No-  Sneezing, itching, no-ear ache, +nasal congestion, +post nasal drip,  CV:  No-   chest pain, orthopnea, PND, swelling in lower extremities, anasarca, dizziness, +palpitations Resp: +  shortness of breath with exertion or at rest.              No-   productive cough,  + non-productive cough,  No- coughing up of blood.              No-   change in color of mucus.  + wheezing.   Skin: No- rash or lesions. GI:  No- heartburn, indigestion, no-abdominal pain, nausea, vomiting,  GU: . MS:  + joint pain or swelling.   Neuro-     nothing unusual Psych:  No- change in mood or affect. + depression or anxiety.  No memory loss.  OBJ- Physical Exam General- Alert, Oriented, Affect-appropriate, Distress- none acute. Overweight Skin- No rash Lymphadenopathy- none Head- atraumatic            Eyes- Gross vision intact, PERRLA, conjunctivae and secretions clear            Ears- Hearing, canals-normal            Nose- Clear, no-Septal dev, mucus, polyps, erosion, perforation             Throat- Mallampati II , mucosa clear , drainage- none, tonsils- atrophic Neck- flexible , trachea midline, no stridor , thyroid nl, carotid no bruit Chest - symmetrical excursion , unlabored           Heart/CV- RRR , no murmur , no gallop  , no rub, nl s1 s2                           - JVD- none , edema- none, stasis changes- none, varices- none           Lung-  Cough+ hacking , dullness-none, rub- none, + trace           Chest wall-  Abd-  Br/ Gen/ Rectal- Not done, not indicated Extrem- cyanosis- none, clubbing, none, atrophy- none, strength- nl Neuro- grossly intact to observation

## 2014-11-06 NOTE — Patient Instructions (Signed)
Neb xop 0.63  Depo 80  Sample and printed script for Dulera 200    2 puffs then rinse mouth, twice daily  Script for [employer]

## 2014-11-09 NOTE — Assessment & Plan Note (Signed)
She has stopped smoking which is a big help. She does seem to be having some pollen aggravation now-asthmatic bronchitis Plan-increased Dulera to 200, Depo-Medrol today

## 2014-11-09 NOTE — Assessment & Plan Note (Signed)
Seasonal exacerbation Plan-antihistamines and nasal steroid sprays should be sufficient.

## 2014-11-12 ENCOUNTER — Encounter: Payer: Self-pay | Admitting: Internal Medicine

## 2015-01-05 ENCOUNTER — Telehealth: Payer: Self-pay | Admitting: Internal Medicine

## 2015-01-05 MED ORDER — ALBUTEROL SULFATE (2.5 MG/3ML) 0.083% IN NEBU: 2.5000 mg | INHALATION_SOLUTION | Freq: Four times a day (QID) | RESPIRATORY_TRACT | Status: DC | PRN

## 2015-01-05 NOTE — Telephone Encounter (Signed)
Pt needed her albuterol neb refilled. i have done so. Nothing further needed

## 2015-01-08 ENCOUNTER — Telehealth: Payer: Self-pay | Admitting: *Deleted

## 2015-01-08 ENCOUNTER — Other Ambulatory Visit: Payer: Self-pay | Admitting: Internal Medicine

## 2015-01-08 MED ORDER — ALBUTEROL SULFATE (2.5 MG/3ML) 0.083% IN NEBU: 2.5000 mg | INHALATION_SOLUTION | Freq: Four times a day (QID) | RESPIRATORY_TRACT | Status: DC | PRN

## 2015-01-08 NOTE — Telephone Encounter (Signed)
Albuterol refilled. Nothing further needed.

## 2015-01-08 NOTE — Telephone Encounter (Addendum)
Dr Carlean Purl   This lady is scheduled for a recall colon 7-1 Friday at 0900 am.  She has a hx of chest pain, bronchitis, allergies, HTN, diabetes, stroke, tia, respiratory failure requiring intubation, a fib, dialated cardiomyopathy. She had a TKR 06-19-2014 during which she had a diffuse chronic spasm resulting in Takotsubo syndrome and her EF was as low as 15-20%.  ( see notes 06-19-2014). She last saw cardio 09-05-2014, her last echo shows an EF of 55-60%.  At her last colon 10-20-10 with [facility] she had a poor prep with movi due to N/V. She has medicaid.  Would you do a  2 day prep ?? ( Medicaid will cover suprep for 3-4 $$ as well) or  Will just 1 prep  do it you think??    Can she proceed with this colon or would you like an OV first? Please advise Thanks a lot, Lelan Pons PV

## 2015-01-12 NOTE — Telephone Encounter (Signed)
OV please

## 2015-01-13 NOTE — Telephone Encounter (Signed)
Spoke with patient and explained need of OV per Dr Carlean Purl. OV 6-29 at 10 am w/ Alonza Bogus. Lelan Pons PV

## 2015-01-28 ENCOUNTER — Encounter: Payer: Self-pay | Admitting: Gastroenterology

## 2015-01-28 ENCOUNTER — Ambulatory Visit (INDEPENDENT_AMBULATORY_CARE_PROVIDER_SITE_OTHER): Payer: Medicaid Other | Admitting: Gastroenterology

## 2015-01-28 VITALS — BP 100/70 | HR 86 | Ht 60.05 in | Wt 172.0 lb

## 2015-01-28 DIAGNOSIS — Z8719 Personal history of other diseases of the digestive system: Secondary | ICD-10-CM

## 2015-01-28 DIAGNOSIS — R1310 Dysphagia, unspecified: Secondary | ICD-10-CM | POA: Diagnosis not present

## 2015-01-28 DIAGNOSIS — Z86010 Personal history of colonic polyps: Secondary | ICD-10-CM

## 2015-01-28 MED ORDER — NA SULFATE-K SULFATE-MG SULF 17.5-3.13-1.6 GM/177ML PO SOLN: ORAL | Status: DC

## 2015-01-28 NOTE — Progress Notes (Signed)
Agree with Ms. Zehr's management.  Carl E. Gessner, MD, FACG  

## 2015-01-28 NOTE — Patient Instructions (Signed)
You have been scheduled for a colonoscopy. Please follow written instructions given to you at your visit today.  Please pick up your prep supplies at the pharmacy within the next 1-3 days. If you use inhalers (even only as needed), please bring them with you on the day of your procedure.   

## 2015-01-28 NOTE — Progress Notes (Signed)
01/28/2015 [name] [name] [id number] Jun 23, 1951   HISTORY OF PRESENT ILLNESS:  This is a 64 year old female who is previously known to Dr. Sharlett Iles for EGD and colonoscopy in 09/2010.  The colonoscopy revealed multiple polyps (tubular adenomas) and mild diverticulosis; prep was poor due to nausea and vomiting with Moviprep, however, so it was recommended that she have a repeat colonoscopy in 3 years from that time.  She is already scheduled for this procedure, but was brought in for assessment due to PMH.  She has COPD but is not on O2 and symptoms are at baseline.  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  She says that she feels well overall.  She would also like to discuss EGD with possible dilation as well.  Has history of dysphagia with esophageal stricture that was dilated to 89F on last EGD in 09/2010.  Also noted to have a hiatal hernia, possible Barrett's esophagus, gastric polyps, and mild gastritis at that time.  Biopsies did not show Barrett's, however.  Gastric polyp was hyperplastic and negative for Hpylori.  She is on Dexilant 60 mg daily.  She says that she's been having problems swallowing some items such as bread, etc again recently; similar to the symptoms she was experiencing before the previous dilation.   Past Medical History  Diagnosis Date  . Insomnia, unspecified   . Esophageal reflux   . Hyperlipemia   . Asthma   . Osteoporosis   . Arthritis   . COPD (chronic obstructive pulmonary disease)   . Depression   . Personal history of colonic polyps 09/2010    TUBULAR ADENOMAS (X3); NEGATIVE FOR HIGH GRADE DYSPLASIA OR MALIGNANCY.  . Barrett esophagus   . Hiatal hernia   . Esophageal stricture   . GERD (gastroesophageal reflux disease)   . GAD (generalized anxiety disorder)   . Aortic valve disorders   . Heart murmur   . Anxiety   . Shortness of  breath     with exertion   . Pneumonia     hx of   . Type II or unspecified type diabetes mellitus without mention of complication, not stated as uncontrolled     on no meds   . Anemia     hx of years ago   . Hypertension 12/07/2012  . Myocardial infarction 01-08-13    '06-Chest pain-no stent-dx. MI-stress related  . History of kidney stones 01-08-13    past hx.  . Transfusion history 01-08-13    past hx. many yrs ago  . Dysrhythmia     heart skips per pt   . 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 Urinary frequency     AND INCONTINENCE   Past Surgical History  Procedure Laterality Date  . Cholecystectomy  2005  . Partial hysterectomy    . Shoulder surgery  2011  . Tubal ligation    . Tonsillectomy    . Foot surgery  2005    Left foot  . Wrist flexion tendon tenotomies and proximal corpectomy w/ wrist arthrodesis&iliac crest bone graft    . Cardiac catheterization      normal per Dr Acie Fredrickson in note dated 02/06/12   . Knee arthroscopy  02/28/2012    Procedure: ARTHROSCOPY KNEE;  Surgeon: Tobi Bastos, MD;  Location: [facility];  Service:  Orthopedics;  Laterality: Left;  . Esophageal dilation  01-08-13    2 yrs ago  . Cataract extraction, bilateral    . Total knee arthroplasty Left 01/17/2013    Procedure: LEFT TOTAL KNEE ARTHROPLASTY;  Surgeon: Tobi Bastos, MD;  Location: [facility];  Service: Orthopedics;  Laterality: Left;  . Total knee arthroplasty Right 06/19/2014    Procedure: RIGHT TOTAL KNEE ARTHROPLASTY;  Surgeon: Tobi Bastos, MD;  Location: [facility];  Service: Orthopedics;  Laterality: Right;    reports that she quit smoking about 19 months ago. Her smoking use included Cigarettes. She has a 10 pack-year smoking history. She quit smokeless tobacco use about 3 years ago. Her smokeless tobacco use included Snuff and Chew. She reports that she drinks about 0.6 oz of alcohol per week. She reports that she does not use illicit drugs. family history  includes Breast cancer in her maternal aunt, maternal grandmother, and mother; Colon cancer (age of onset: 80) in her brother; Colon polyps in her brother; Diabetes in her maternal grandfather; Heart disease in an other family member; Kidney disease in an other family member. Allergies  Allergen Reactions  . Ceclor [Cefaclor] Other (See Comments)    Reaction=burning all over  . Other Anaphylaxis    BEE STINGS- CLOSE OFF THROAT  . Penicillins Anaphylaxis    "CLOSES OFF MY BREATHING"  . Pneumococcal Vaccines Other (See Comments)    PP-23 vaccine; had BIG local red reaction with heat.   . Codeine Nausea And Vomiting  . Cortisone Hives    All over body  . Boniva [Ibandronate Sodium] Other (See Comments)    Jaw popping      Outpatient Encounter Prescriptions as of 01/28/2015  Medication Sig  . acetaminophen (TYLENOL) 325 MG tablet Take 1-2 tablets (325-650 mg total) by mouth every 4 (four) hours as needed for mild pain.  [name] albuterol (PROVENTIL) (2.5 MG/3ML) 0.083% nebulizer solution Take 3 mLs (2.5 mg total) by nebulization every 6 (six) hours as needed for wheezing. DX  491.9  . ALPRAZolam (XANAX) 0.5 MG tablet Take 0.5 mg by mouth 3 (three) times daily.  [name] aspirin EC 81 MG tablet Take 81 mg by mouth every morning.   [name] atorvastatin (LIPITOR) 40 MG tablet Take 1 tablet (40 mg total) by mouth at bedtime.  . calcium carbonate (OS-CAL) 600 MG TABS Take 600 mg by mouth at bedtime.   . carvedilol (COREG) 3.125 MG tablet Take 1 tablet (3.125 mg total) by mouth 2 (two) times daily.  . CHANTIX STARTING MONTH PAK 0.5 MG X 11 & 1 MG X 42 tablet TAKE AS DIRECTED  . cholecalciferol (VITAMIN D) 1000 UNITS tablet Take 1,000 Units by mouth daily.  [name] dexlansoprazole (DEXILANT) 60 MG capsule Take 1 capsule (60 mg total) by mouth every morning.  . docusate sodium (COLACE) 100 MG capsule Take 100 mg by mouth 2 (two) times daily.  [name] EPINEPHrine 0.3 mg/0.3 mL IJ SOAJ injection Inject 0.3 mLs (0.3 mg total) into  the muscle as needed.  . ferrous sulfate 325 (65 FE) MG tablet Take 1 tablet (325 mg total) by mouth 2 (two) times daily.  . fluticasone (FLONASE) 50 MCG/ACT nasal spray Place 2 sprays into the nose daily. (Patient taking differently: Place 1 spray into the nose 2 (two) times daily. )  . furosemide (LASIX) 40 MG tablet Take 40 mg by mouth daily.  [name] losartan (COZAAR) 50 MG tablet Take 1 tablet (50 mg total) by mouth daily.  [name]  Menthol (HONEY LEMON COUGH DROPS MT) Use as directed in the mouth or throat. Taking approx. 8-9 daily  . metFORMIN (GLUCOPHAGE) 500 MG tablet Take 1 tablet (500 mg total) by mouth 2 (two) times daily.  . methocarbamol (ROBAXIN) 500 MG tablet Take 1 tablet (500 mg total) by mouth every 6 (six) hours as needed for muscle spasms.  . mometasone-formoterol (DULERA) 200-5 MCG/ACT AERO 2 puffs then rinse mouth, twice daily  . mometasone-formoterol (DULERA) 200-5 MCG/ACT AERO Inhale 2 puffs into the lungs 2 (two) times daily.  . montelukast (SINGULAIR) 10 MG tablet Take 1 tablet (10 mg total) by mouth at bedtime.  [name] PARoxetine (PAXIL) 20 MG tablet Take 20 mg by mouth at bedtime.   . polyethylene glycol (MIRALAX / GLYCOLAX) packet Take 17 g by mouth 2 (two) times daily.  [name] senna-docusate (SENOKOT-S) 8.6-50 MG per tablet Take 2 tablets by mouth 2 (two) times daily.  . vitamin B-12 (CYANOCOBALAMIN) 250 MCG tablet Take 250 mcg by mouth daily.  [name] albuterol (PROVENTIL HFA;VENTOLIN HFA) 108 (90 BASE) MCG/ACT inhaler Inhale 2 puffs into the lungs every 4 (four) hours as needed for wheezing or shortness of breath. Take two puffs four times a day as needed  . Na Sulfate-K Sulfate-Mg Sulf SOLN Take as directed for colonoscopy   Facility-Administered Encounter Medications as of 01/28/2015  Medication  . tranexamic acid (CYKLOKAPRON) 2,000 mg in sodium chloride 0.9 % 50 mL Topical Application     REVIEW OF SYSTEMS  : All other systems reviewed and negative except where noted in the History of  Present Illness.   PHYSICAL EXAM: BP 100/70 mmHg  Pulse 86  Ht 5' 0.05" (1.525 m)  Wt 172 lb (78.019 kg)  BMI 33.55 kg/m2  SpO2 99% General: Well developed white female in no acute distress Head: Normocephalic and atraumatic Eyes:  Sclerae anicteric, conjunctiva pink. Ears: Normal auditory acuity Lungs: Clear throughout to auscultation Heart: Regular rate and rhythm Abdomen: Soft, non-distended.  Normal bowel sounds.  Non-tender. Rectal:  Will be done at the time of colonoscopy. Musculoskeletal: Symmetrical with no gross deformities  Skin: No lesions on visible extremities Extremities: No edema  Neurological: Alert oriented x 4, grossly non-focal Psychological:  Alert and cooperative. Normal mood and affect  ASSESSMENT AND PLAN: -Personal history of colon polyps:  Multiple on colonoscopy 09/2010.  Prep was poor at that time (due to nausea and vomiting) so repeat was recommended in 3 years.   -Dysphagia to solid food with history of esophageal stricture requiring dilation on EGD also 09/2010  *Will schedule EGD with possible dilation and colonoscopy with Dr. Carlean Purl.  The risks, benefits, and alternatives were discussed with the patient and they consent to proceed.    CC:  Velna Hatchet, MD

## 2015-01-30 ENCOUNTER — Encounter: Payer: Medicaid Other | Admitting: Internal Medicine

## 2015-03-18 ENCOUNTER — Ambulatory Visit (AMBULATORY_SURGERY_CENTER): Payer: Medicaid Other | Admitting: Internal Medicine

## 2015-03-18 ENCOUNTER — Encounter: Payer: Self-pay | Admitting: Internal Medicine

## 2015-03-18 VITALS — BP 118/69 | HR 93 | Temp 97.7°F | Resp 21 | Ht 60.0 in | Wt 172.0 lb

## 2015-03-18 DIAGNOSIS — D122 Benign neoplasm of ascending colon: Secondary | ICD-10-CM

## 2015-03-18 DIAGNOSIS — Z86010 Personal history of colonic polyps: Secondary | ICD-10-CM

## 2015-03-18 DIAGNOSIS — D125 Benign neoplasm of sigmoid colon: Secondary | ICD-10-CM

## 2015-03-18 DIAGNOSIS — R1310 Dysphagia, unspecified: Secondary | ICD-10-CM | POA: Diagnosis not present

## 2015-03-18 LAB — GLUCOSE, CAPILLARY: Glucose-Capillary: 123 mg/dL — ABNORMAL HIGH (ref 65–99)

## 2015-03-18 MED ORDER — SODIUM CHLORIDE 0.9 % IV SOLN: 500.0000 mL | INTRAVENOUS | Status: DC

## 2015-03-18 NOTE — Progress Notes (Signed)
Called to room to assist during endoscopic procedure.  Patient ID and intended procedure confirmed with present staff. Received instructions for my participation in the procedure from the performing physician.  

## 2015-03-18 NOTE — Progress Notes (Signed)
Transferred to recovery room. A/O x3, pleased with MAC.  VSS.  Report to Wendy, RN. 

## 2015-03-18 NOTE — Patient Instructions (Addendum)
I stretched the esophagus so you can swallow better.  I found and removed 2 small polyps from the colon.  I will let you know pathology results and when to have another routine colonoscopy by mail.  Next routine colonoscopy in 10 years - 2026 [name], MD, FACG  YOU HAD AN ENDOSCOPIC PROCEDURE TODAY AT [place]:   Refer to the procedure report that was given to you for any specific questions about what was found during the examination.  If the procedure report does not answer your questions, please call your gastroenterologist to clarify.  If you requested that your care partner not be given the details of your procedure findings, then the procedure report has been included in a sealed envelope for you to review at your convenience later.  YOU SHOULD EXPECT: Some feelings of bloating in the abdomen. Passage of more gas than usual.  Walking can help get rid of the air that was put into your GI tract during the procedure and reduce the bloating. If you had a lower endoscopy (such as a colonoscopy or flexible sigmoidoscopy) you may notice spotting of blood in your stool or on the toilet paper. If you underwent a bowel prep for your procedure, you may not have a normal bowel movement for a few days.  Please Note:  You might notice some irritation and congestion in your nose or some drainage.  This is from the oxygen used during your procedure.  There is no need for concern and it should clear up in a day or so.  SYMPTOMS TO REPORT IMMEDIATELY:   Following lower endoscopy (colonoscopy or flexible sigmoidoscopy):  Excessive amounts of blood in the stool  Significant tenderness or worsening of abdominal pains  Swelling of the abdomen that is new, acute  Fever of 100F or higher   Following upper endoscopy (EGD)  Vomiting of blood or coffee ground material  New chest pain or pain under the shoulder blades  Painful or persistently difficult swallowing  New  shortness of breath  Fever of 100F or higher  Black, tarry-looking stools  For urgent or emergent issues, a gastroenterologist can be reached at any hour by calling [phone].   DIET: Your first meal following the procedure should be a small meal and then it is ok to progress to your normal diet. Heavy or fried foods are harder to digest and may make you feel nauseous or bloated.  Likewise, meals heavy in dairy and vegetables can increase bloating.  Drink plenty of fluids but you should avoid alcoholic beverages for 24 hours.  ACTIVITY:  You should plan to take it easy for the rest of today and you should NOT DRIVE or use heavy machinery until tomorrow (because of the sedation medicines used during the test).    FOLLOW UP: Our staff will call the number listed on your records the next business day following your procedure to check on you and address any questions or concerns that you may have regarding the information given to you following your procedure. If we do not reach you, we will leave a message.  However, if you are feeling well and you are not experiencing any problems, there is no need to return our call.  We will assume that you have returned to your regular daily activities without incident.  If any biopsies were taken you will be contacted by phone or by letter within the next 1-3 weeks.  Please call us at [phone]  [phone] if you have not heard about the biopsies in 3 weeks.    SIGNATURES/CONFIDENTIALITY: You and/or your care partner have signed paperwork which will be entered into your electronic medical record.  These signatures attest to the fact that that the information above on your After Visit Summary has been reviewed and is understood.  Full responsibility of the confidentiality of this discharge information lies with you and/or your care-partner.  Please follow dilation diet instructions given and review handout provided. Next colonoscopy determined by pathology  results. Pol

## 2015-03-18 NOTE — Op Note (Signed)
[place]  [address] [place], [phone]   ENDOSCOPY PROCEDURE REPORT  PATIENT: [name] [name], [name] [name]  MR#: [id number] BIRTHDATE: Dec 03, 1950 , 26  yrs. old GENDER: female ENDOSCOPIST: [name], MD, [facility] PROCEDURE DATE:  03/18/2015 PROCEDURE:  EGD, diagnostic and Maloney dilation of esophagus ASA CLASS:     Class III INDICATIONS:  dysphagia. MEDICATIONS: Monitored anesthesia care and Propofol 150 mg IV TOPICAL ANESTHETIC: none  DESCRIPTION OF PROCEDURE: After the risks benefits and alternatives of the procedure were thoroughly explained, informed consent was obtained.  The LB [id number] endoscope was introduced through the mouth and advanced to the second portion of the duodenum , Without limitations.  The instrument was slowly withdrawn as the mucosa was fully examined.    1-2 cm hital hernia otherwise normel EGD.  51 Fr Maloney dilator passed without difficulty or heme.  Retroflexed views revealed as previously described.     The scope was then withdrawn from the patient and the procedure completed.  COMPLICATIONS: There were no immediate complications.  ENDOSCOPIC IMPRESSION: 1-2 cm hital hernia otherwise normel EGD.  3 Fr Maloney dilator passed without difficulty or heme  RECOMMENDATIONS: 1.  Clear liquids until 330 PM, then soft foods rest of day.  Resume prior diet tomorrow. 2.  Proceed with a Colonoscopy.  REPEAT EXAM:  eSigned:  [name], MD, [facility] 03/18/2015 2:30 PM    CC: Velna Hatchet, MD and The Patient

## 2015-03-18 NOTE — Op Note (Signed)
[place]  [address] [place], [phone]   COLONOSCOPY PROCEDURE REPORT  PATIENT: [name] [name], [name] [name]  MR#: [id number] BIRTHDATE: 1950/11/02 , 70  yrs. old GENDER: female ENDOSCOPIST: [name], MD, [facility] PROCEDURE DATE:  03/18/2015 PROCEDURE:   Colonoscopy, surveillance and Colonoscopy with snare polypectomy First Screening Colonoscopy - Avg.  risk and is 50 yrs.  old or older - No.  Prior Negative Screening - Now for repeat screening. N/A  History of Adenoma - Now for follow-up colonoscopy & has been > or = to 3 yrs.  Yes hx of adenoma.  Has been 3 or more years since last colonoscopy.  Polyps removed today? Yes ASA CLASS:   Class III INDICATIONS:Surveillance due to prior colonic neoplasia and PH Colon Adenoma. MEDICATIONS: Propofol 150 mg IV, Monitored anesthesia care, and Residual sedation present  DESCRIPTION OF PROCEDURE:   After the risks benefits and alternatives of the procedure were thoroughly explained, informed consent was obtained.  The digital rectal exam revealed no abnormalities of the rectum.   The LB [id number]  endoscope was introduced through the anus and advanced to the cecum, which was identified by both the appendix and ileocecal valve. No adverse events experienced.   The quality of the prep was excellent. (MiraLax was used)  The instrument was then slowly withdrawn as the colon was fully examined. Estimated blood loss is zero unless otherwise noted in this procedure report.      COLON FINDINGS: Two sessile polyps ranging from 3 to 58mm in size were found in the ascending colon and sigmoid colon.  Polypectomies were performed with a cold snare.  The resection was complete, the polyp tissue was completely retrieved and sent to histology. Retroflexed views revealed no abnormalities. The time to cecum = 2.3 Withdrawal time = 10.5   The scope was withdrawn and the procedure completed. COMPLICATIONS: There were no immediate  complications.  ENDOSCOPIC IMPRESSION: Two sessile polyps ranging from 3 to 47mm in size were found in the ascending colon and sigmoid colon; polypectomies were performed with a cold snare  RECOMMENDATIONS: Timing of repeat colonoscopy will be determined by pathology findings.  eSigned:  [name], MD, [facility] 03/18/2015 2:33 PM   cc: Velna Hatchet, MD and The Patient

## 2015-03-19 ENCOUNTER — Telehealth: Payer: Self-pay | Admitting: *Deleted

## 2015-03-19 LAB — GLUCOSE, CAPILLARY: GLUCOSE-CAPILLARY: 106 mg/dL — AB (ref 65–99)

## 2015-03-19 NOTE — Telephone Encounter (Signed)
  Follow up Call-  Call back number 03/18/2015  Post procedure Call Back phone  # [phone]  Permission to leave phone message Yes     Patient questions:  Do you have a fever, pain , or abdominal swelling? No. Pain Score  0 *  Have you tolerated food without any problems? Yes.    Have you been able to return to your normal activities? Yes.    Do you have any questions about your discharge instructions: Diet   No. Medications  No. Follow up visit  No.  Do you have questions or concerns about your Care? No.  Actions: * If pain score is 4 or above: No action needed, pain <4.

## 2015-03-23 ENCOUNTER — Ambulatory Visit (INDEPENDENT_AMBULATORY_CARE_PROVIDER_SITE_OTHER): Payer: Medicaid Other | Admitting: Cardiovascular Disease

## 2015-03-23 ENCOUNTER — Encounter: Payer: Self-pay | Admitting: Cardiovascular Disease

## 2015-03-23 VITALS — BP 124/72 | HR 57 | Ht 60.0 in | Wt 171.0 lb

## 2015-03-23 DIAGNOSIS — I5181 Takotsubo syndrome: Secondary | ICD-10-CM | POA: Diagnosis not present

## 2015-03-23 NOTE — Patient Instructions (Signed)
Medication Instructions:  Your physician recommends that you continue on your current medications as directed. Please refer to the Current Medication list given to you today.   Labwork: None Ordered   Testing/Procedures: None Ordered   Follow-Up: Your physician wants you to follow-up in: 6 months with Dr. Nahser.  You will receive a reminder letter in the mail two months in advance. If you don't receive a letter, please call our office to schedule the follow-up appointment.     

## 2015-03-23 NOTE — Progress Notes (Signed)
Cardiology Office Note   Date:  03/23/2015   ID:  Devanshi, Califf 07/19/1951, MRN [id number]  PCP:  Velna Hatchet, MD  Cardiologist:   Thayer Headings, MD   Chief Complaint  Patient presents with  . Follow-up    takotsubo syndrome   1. Takotsubo Syndrome  2. Normal heart catheterization 2006 3. Hyperlipidemia 4. Left knee pain   History of Present Illness: [name] [name] is a 64 y.o. female who presents for follow up of her Takotsubo syndrome.   At developed acute systolic congestive heart failure following her leg surgery in Nov, 2015.  [name] She apparently had diffuse chronic spasm resulting in Takotsubo syndrome. Her EF was low as 15-20%. She has improved significantly and now her EF has normallized  She has maintained sinus rhythm.  We started  her on carvedilol 3.125 mg twice a day and losartan 50 mg a day in Dec. 2015.  [name]  She has done very well.   No dyspnea , no CP .   March 23, 2015:  Doing well - had a takotsubo syndrome in Nov. 2015 following knee surgery   Occasional CP with activity  She has had a cath   Past Medical History  Diagnosis Date  . Insomnia, unspecified   . Esophageal reflux   . Hyperlipemia   . Asthma   . Osteoporosis   . Arthritis   . COPD (chronic obstructive pulmonary disease)   . Depression   . Personal history of colonic polyps 09/2010    TUBULAR ADENOMAS (X3); NEGATIVE FOR HIGH GRADE DYSPLASIA OR MALIGNANCY.  . Barrett esophagus   . Hiatal hernia   . Esophageal stricture   . GERD (gastroesophageal reflux disease)   . GAD (generalized anxiety disorder)   . Aortic valve disorders   . Heart murmur   . Anxiety   . Shortness of breath     with exertion   . Pneumonia     hx of   . Type II or unspecified type diabetes mellitus without mention of complication, not stated as uncontrolled     on no meds   . Anemia     hx of years ago   . Hypertension 12/07/2012  . Myocardial infarction 01-08-13    '06-Chest pain-no  stent-dx. MI-stress related  . History of kidney stones 01-08-13    past hx.  . Transfusion history 01-08-13    past hx. many yrs ago  . Dysrhythmia     heart skips per pt   . 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 Urinary frequency     AND INCONTINENCE    Past Surgical History  Procedure Laterality Date  . Cholecystectomy  2005  . Partial hysterectomy    . Shoulder surgery  2011  . Tubal ligation    . Tonsillectomy    . Foot surgery  2005    Left foot  . Wrist flexion tendon tenotomies and proximal corpectomy w/ wrist arthrodesis&iliac crest bone graft    . Cardiac catheterization      normal per Dr Acie Fredrickson in note dated 02/06/12   . Knee arthroscopy  02/28/2012    Procedure: ARTHROSCOPY KNEE;  Surgeon: Tobi Bastos, MD;  Location: [facility];  Service: Orthopedics;  Laterality: Left;  . Esophageal dilation  01-08-13    2 yrs ago  . Cataract extraction, bilateral    . Total knee arthroplasty Left 01/17/2013  Procedure: LEFT TOTAL KNEE ARTHROPLASTY;  Surgeon: Tobi Bastos, MD;  Location: [facility];  Service: Orthopedics;  Laterality: Left;  . Total knee arthroplasty Right 06/19/2014    Procedure: RIGHT TOTAL KNEE ARTHROPLASTY;  Surgeon: Tobi Bastos, MD;  Location: [facility];  Service: Orthopedics;  Laterality: Right;     Current Outpatient Prescriptions  Medication Sig Dispense Refill  . acetaminophen (TYLENOL) 325 MG tablet Take 1-2 tablets (325-650 mg total) by mouth every 4 (four) hours as needed for mild pain. (Patient not taking: Reported on 03/18/2015)    . albuterol (PROVENTIL HFA;VENTOLIN HFA) 108 (90 BASE) MCG/ACT inhaler Inhale 2 puffs into the lungs every 4 (four) hours as needed for wheezing or shortness of breath. Take two puffs four times a day as needed 1 Inhaler prn  . albuterol (PROVENTIL) (2.5 MG/3ML) 0.083% nebulizer solution Take 3 mLs (2.5 mg total) by nebulization every 6 (six) hours as needed for wheezing. DX  491.9 360 mL 12  .  ALPRAZolam (XANAX) 0.5 MG tablet Take 0.5 mg by mouth 3 (three) times daily.    [name] aspirin EC 81 MG tablet Take 81 mg by mouth every morning.     [name] atorvastatin (LIPITOR) 40 MG tablet Take 1 tablet (40 mg total) by mouth at bedtime. 30 tablet 0  . calcium carbonate (OS-CAL) 600 MG TABS Take 600 mg by mouth at bedtime.     . carvedilol (COREG) 3.125 MG tablet Take 1 tablet (3.125 mg total) by mouth 2 (two) times daily. 180 tablet 3  . CHANTIX STARTING MONTH PAK 0.5 MG X 11 & 1 MG X 42 tablet TAKE AS DIRECTED (Patient not taking: Reported on 03/18/2015) 53 tablet 0  . cholecalciferol (VITAMIN D) 1000 UNITS tablet Take 1,000 Units by mouth daily.    [name] dexlansoprazole (DEXILANT) 60 MG capsule Take 1 capsule (60 mg total) by mouth every morning. 30 capsule 5  . EPINEPHrine 0.3 mg/0.3 mL IJ SOAJ injection Inject 0.3 mLs (0.3 mg total) into the muscle as needed. (Patient not taking: Reported on 03/18/2015) 1 Device 1  . ferrous sulfate 325 (65 FE) MG tablet Take 1 tablet (325 mg total) by mouth 2 (two) times daily. 60 tablet 1  . fluticasone (FLONASE) 50 MCG/ACT nasal spray Place 2 sprays into the nose daily. (Patient not taking: Reported on 03/18/2015) 16 g prn  . furosemide (LASIX) 40 MG tablet Take 40 mg by mouth daily.    [name] losartan (COZAAR) 50 MG tablet Take 1 tablet (50 mg total) by mouth daily. 90 tablet 3  . Menthol (HONEY LEMON COUGH DROPS MT) Use as directed in the mouth or throat. Taking approx. 8-9 daily    . metFORMIN (GLUCOPHAGE) 500 MG tablet Take 1 tablet (500 mg total) by mouth 2 (two) times daily. 60 tablet 0  . methocarbamol (ROBAXIN) 500 MG tablet Take 1 tablet (500 mg total) by mouth every 6 (six) hours as needed for muscle spasms. (Patient not taking: Reported on 03/18/2015) 75 tablet 0  . mometasone-formoterol (DULERA) 200-5 MCG/ACT AERO 2 puffs then rinse mouth, twice daily 1 Inhaler prn  . montelukast (SINGULAIR) 10 MG tablet Take 1 tablet (10 mg total) by mouth at bedtime. 30 tablet 11   . PARoxetine (PAXIL) 20 MG tablet Take 20 mg by mouth at bedtime.     . senna-docusate (SENOKOT-S) 8.6-50 MG per tablet Take 2 tablets by mouth 2 (two) times daily. 100 tablet 0  . vitamin B-12 (CYANOCOBALAMIN) 250 MCG tablet Take  250 mcg by mouth daily.     No current facility-administered medications for this visit.   Facility-Administered Medications Ordered in Other Visits  Medication Dose Route Frequency Provider Last Rate Last Dose  . 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 Her smoking use included Cigarettes. She has a 10 pack-year smoking history. She quit smokeless tobacco use about 3 years ago. Her smokeless tobacco use included Snuff and Chew. She reports that she drinks about 0.6 oz of alcohol per week. She reports that she does not use illicit drugs.   Family History:  The patient's family history includes Breast cancer in her maternal aunt, maternal grandmother, and mother; Colon cancer (age of onset: 64) in her brother; Colon polyps in her brother; Diabetes in her maternal grandfather; Heart disease in an other family member; Kidney disease in an other family member.    ROS:  Please see the history of present illness.    Review of Systems: Constitutional:  denies fever, chills, diaphoresis, appetite change and fatigue.  HEENT: denies photophobia, eye pain, redness, hearing loss, ear pain, congestion, sore throat, rhinorrhea, sneezing, neck pain, neck stiffness and tinnitus.  Respiratory: denies SOB, DOE, cough, chest tightness, and wheezing.  Cardiovascular: denies chest pain, palpitations and leg swelling.  Gastrointestinal: denies nausea, vomiting, abdominal pain, diarrhea, constipation, blood  in stool.  Genitourinary: denies dysuria, urgency, frequency, hematuria, flank pain and difficulty urinating.  Musculoskeletal: denies  myalgias, back pain, joint swelling, arthralgias and gait problem.   Skin: denies pallor, rash and wound.  Neurological: denies dizziness, seizures, syncope, weakness, light-headedness, numbness and headaches.   Hematological: denies adenopathy, easy bruising, personal or family bleeding history.  Psychiatric/ Behavioral: denies suicidal ideation, mood changes, confusion, nervousness, sleep disturbance and agitation.       All other systems are reviewed and negative.    PHYSICAL EXAM: VS:  BP 124/72 mmHg  Pulse 57  Ht 5' (1.524 m)  Wt 77.565 kg (171 lb)  BMI 33.40 kg/m2 , BMI Body mass index is 33.4 kg/(m^2). GEN: Well nourished, well developed, in no acute distress HEENT: normal Neck: no JVD, carotid bruits, or masses Cardiac: RRR; no murmurs, rubs, or gallops,no edema  Respiratory:  clear to auscultation bilaterally, normal work of breathing GI: soft, nontender, nondistended, + BS MS: no deformity or atrophy Skin: warm and dry, no rash Neuro:  Strength and sensation are intact Psych: normal   EKG:  EKG is ordered today. Sinus brady at 57.  LAD .   Recent Labs: 06/19/2014: Pro B Natriuretic peptide (BNP) 108.0 06/22/2014: TSH 3.120 06/23/2014: Magnesium 2.1 06/25/2014: ALT 95*; Hemoglobin 8.9*; Platelets 233 09/05/2014: BUN 12; Creatinine, Ser 0.91; Potassium 3.6; Sodium 138    Lipid Panel    Component Value Date/Time   CHOL 129 12/07/2012 1445   TRIG 147 12/07/2012 1445   HDL 40 12/07/2012 1445   LDLCALC 60 12/07/2012 1445      Wt Readings from Last 3 Encounters:  03/23/15 77.565 kg (171 lb)  03/18/15 78.019 kg (172 lb)  01/28/15 78.019 kg (172 lb)      Other studies Reviewed: Additional studies/ records that were reviewed today include: . Review of the above records demonstrates:  ASSESSMENT AND PLAN:  1.  Takotsubo Syndrome -  She developed diffuse coronary  spasm and Takotsubo  syndrome following her knee surgery in November. Her ejection fraction was as low as 15%. She has recovered quite nicely. She's now on carvedilol and losartan. She seems to be feeling quite a bit better. Her ejection fraction has now normalized. We'll continue with supportive care. She has not been walking as much as I would like. I've encouraged her to get out and walk between 2 and 3 miles a day for times a week. I think this will help a lot with her endurance. I'll see her in 6 months.  2. Normal heart catheterization 2006  3. Hyperlipidemia -  Managed by her primary medical doctor. 4. Left knee pain   Current medicines are reviewed at length with the patient today.  The patient does not have concerns regarding medicines.  The following changes have been made:  no change   Disposition:   FU with 6 months     Signed, Arnette Driggs, Wonda Cheng, MD  03/23/2015 4:26 PM    [facility] [address], [place], [place]  [address] Phone: [phone]; Fax: [phone]

## 2015-03-24 ENCOUNTER — Encounter: Payer: Self-pay | Admitting: Internal Medicine

## 2015-03-24 DIAGNOSIS — Z86010 Personal history of colonic polyps: Secondary | ICD-10-CM

## 2015-03-24 NOTE — Progress Notes (Signed)
Quick Note:  2 adenomas diminutive - repeat colonoscopy 5 years - 2021 ______

## 2015-05-08 ENCOUNTER — Ambulatory Visit (INDEPENDENT_AMBULATORY_CARE_PROVIDER_SITE_OTHER): Payer: Medicaid Other | Admitting: Internal Medicine

## 2015-05-08 ENCOUNTER — Encounter: Payer: Self-pay | Admitting: Internal Medicine

## 2015-05-08 VITALS — BP 110/60 | HR 50 | Ht 60.5 in | Wt 175.0 lb

## 2015-05-08 DIAGNOSIS — J209 Acute bronchitis, unspecified: Secondary | ICD-10-CM | POA: Diagnosis not present

## 2015-05-08 DIAGNOSIS — Z23 Encounter for immunization: Secondary | ICD-10-CM | POA: Diagnosis not present

## 2015-05-08 DIAGNOSIS — J42 Unspecified chronic bronchitis: Secondary | ICD-10-CM

## 2015-05-08 MED ORDER — ALBUTEROL SULFATE HFA 108 (90 BASE) MCG/ACT IN AERS: 2.0000 | INHALATION_SPRAY | RESPIRATORY_TRACT | Status: DC | PRN

## 2015-05-08 MED ORDER — BENZONATATE 200 MG PO CAPS: 200.0000 mg | ORAL_CAPSULE | Freq: Three times a day (TID) | ORAL | Status: DC | PRN

## 2015-05-08 MED ORDER — PREDNISONE 20 MG PO TABS: ORAL_TABLET | ORAL | Status: DC

## 2015-05-08 NOTE — Progress Notes (Signed)
07/24/12- 63 yoF former smoker (06/24/12), former patient at old office,  Ref By Dr. Myers/ [facility] -- [name] [name] reports having prod cough w thick green mucus, pnd, chest tightness x4 weeks occuring mostly w weather change [name] [name] reports cough and chest congestion off and on for years. Says [name] [name] just stopped smoking in November of 2013. Now green sputum for 2 or 3 weeks with scratchy throat but no fever, blood or pain. Some cough even on good days. Occasional wheeze and [name] [name] uses her rescue inhaler between 3 and 6 times daily. History of an allergic component and was on allergy vaccine years ago. Reports urticaria last week after steroid injection in her knee and shoulder, and also had flu vaccine about the same time. [name] [name] treated herself with Benadryl successfully. [name] [name] remains on Benadryl 50 mg twice daily. Has had hives before and "nervous". Several episodes of pneumonia. Aware of GERD symptoms despite Dexilant. CXR- 02/22/12 image reviewed IMPRESSION:  Negative exam.  Original Report Authenticated By: [name] [name], M.D.   09/04/12- 31 yoF former smoker (06/24/12), former patient at old office,  Follows For: 4 week f/u - Non prod cough - Sinus and chest congestion - SOB worse with weather changes - Chest tightness and wheezing - Out of Advair for 2 weeks Advair sample helped. Dry cough. Postnasal drip with yellow nasal discharge. Hives resolved. Nebulizer does help. CXR 09/04/12 IMPRESSION:  No acute disease.  Original Report Authenticated By: [name] [name], M.D.  11/05/12- 80 yoF former smoker (06/24/12), former patient at old office, followed for chronic bronchitis. FOLLOWS FOR: PFTs today-- [name] [name] reports breathing is "slightly worse" d/t weather, gets SOB easier, prod cough at times-- denies any other concerns at this time [name] [name] sample worked well instead of advair. LOV nebulizer and depomedrol helped " a bunch", as did Tessalon for cough. Still smoking 4-5 cigarettes/ day and resists blaming this for any  airway problems. [name]. Also blames oil heat and lack of airconditioning in her home. Asks letter stating those are aggravating her breathing. Says [name] [name] looks for opportunities to be out of house. CXR 09/04/12 IMPRESSION:  No acute disease.  Original Report Authenticated By: [name] [name], M.D. PFT- 11/05/12- Normal except mild reduction of DLCO. No response to bronchodilator. FVC 2.17/ 83%, FEV1 1.76/ 91%, FEV1/FVC 0.81, TLC 93%, DLCO 68%.  05/07/13- 79 yoF former smoker (06/24/12), former patient at old office, followed for chronic bronchitis FOLLOWS ZOX:WRUEA more sob since last night,chest tightness mid chest, cough at hs -yellow green,denies wheezing and chest pain,says Dulera worked well,needs refills for [employer] Mostly coughs with colds. Admits he still smokes an occasional cigarette and we discussed smoking cessation.  11/06/13-  3 yoF former smoker (06/24/12), former patient at old office, followed for chronic bronchitis, complicated by DM2, hx CVA FOLLOWS FOR: Breathing is worse since season changed. Pollen is making things worse. Reports SOB, coughing with production of yellow/green mucus. 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 Chest tight.  05/07/14- 89 yoF former smoker (06/24/12), , followed for chronic bronchitis, allergic rhinitis complicated by DM2, hx CVA FOLLOW FOR: Chronic Bronchitis; currently on abx for inner ear infection; wants to discuss going on Singular for allergies;  sinus and chest congestion; non-productive cough  11/06/14-  63 yoF former smoker, , followed for chronic bronchitis, allergic rhinitis complicated by DM2, hx CVA FOLLOWS FOR: [name] [name] states [name] [name] is having slight chest tightness. Blames pollen for stuffy nose and chest tightness with mild wheeze, dyspnea on exertion. No chest pain or palpitation.  Quit smoking 6 months ago! [name] [name] is remarried CXR 06/21/14 IMPRESSION: Continued stable interstitial prominence throughout the  lungs. Electronically Signed  By: [name] [name] M.D.  On: 06/21/2014 11:09  05/08/15-63 yoF former smoker, , followed for chronic bronchitis, allergic rhinitis complicated by DM2, hx CVA,, AFib FOLLOWS FOR: [name] [name]. states [name] [name] has occ. SOB occ. wheezing. denies chest pain or tightness. dry cough x4 days. states allergies are rough. runny nose and cogestion.   Dry hacking cough for the past 3 days as [name] [name] returns from honeymoon.  ROS-see HPI Constitutional:   No-   weight loss, night sweats, fevers, chills, fatigue, lassitude. HEENT:   +  headaches, difficulty swallowing, tooth/dental problems, sore throat,       No-  Sneezing, itching, no-ear ache, +nasal congestion, +post nasal drip,  CV:  No-   chest pain, orthopnea, PND, swelling in lower extremities, anasarca, dizziness, +palpitations Resp: +  shortness of breath with exertion or at rest.              No-   productive cough,  + non-productive cough,  No- coughing up of blood.              No-   change in color of mucus.   wheezing.   Skin: No- rash or lesions. GI:  No- heartburn, indigestion, no-abdominal pain, nausea, vomiting,  GU: . MS:  + joint pain or swelling.   Neuro-     nothing unusual Psych:  No- change in mood or affect. + depression or anxiety.  No memory loss.  OBJ- Physical Exam General- Alert, Oriented, Affect-appropriate, Distress- none acute. + Overweight Skin- No rash Lymphadenopathy- none Head- atraumatic            Eyes- Gross vision intact, PERRLA, conjunctivae and secretions clear            Ears- Hearing, canals-normal            Nose- Clear, no-Septal dev, mucus, polyps, erosion, perforation             Throat- Mallampati III-IV , mucosa clear , drainage- none, tonsils- atrophic Neck- flexible , trachea midline, no stridor , thyroid nl, carotid no bruit Chest - symmetrical excursion , unlabored           Heart/CV- RRR , no murmur , no gallop  , no rub, nl s1 s2                           - JVD- none , edema-  none, stasis changes- none, varices- none           Lung-  Cough+ hacking , dullness-none, rub- none, + trace           Chest wall-  Abd-  Br/ Gen/ Rectal- Not done, not indicated Extrem- cyanosis- none, clubbing, none, atrophy- none, strength- nl Neuro- grossly intact to observation

## 2015-05-08 NOTE — Assessment & Plan Note (Signed)
We will treat this as a mild bronchitis and watch for possibility that there is a component of CHF because of her known cardiac history. Plan-refill rescue inhaler and benzonatate, short course prednisone

## 2015-05-08 NOTE — Patient Instructions (Signed)
Scripts sent for benzonatate perles for cough, rescue inhaler and prednisone to [facility] in [place]

## 2015-07-10 ENCOUNTER — Ambulatory Visit (HOSPITAL_COMMUNITY)
Admission: RE | Admit: 2015-07-10 | Discharge: 2015-07-10 | Disposition: A | Discharge status: Home / Self Care | Payer: Medicaid Other | Source: Ambulatory Visit | Attending: Internal Medicine | Admitting: Internal Medicine

## 2015-07-10 ENCOUNTER — Encounter (HOSPITAL_COMMUNITY): Payer: Self-pay

## 2015-07-10 DIAGNOSIS — M810 Age-related osteoporosis without current pathological fracture: Secondary | ICD-10-CM | POA: Insufficient documentation

## 2015-07-10 MED ORDER — ZOLEDRONIC ACID 5 MG/100ML IV SOLN
5.0000 mg | Freq: Once | INTRAVENOUS | Status: AC
  Administered 2015-07-10: 5 mg via INTRAVENOUS
  Filled 2015-07-10: qty 100

## 2015-07-10 MED ORDER — SODIUM CHLORIDE 0.9 % IV SOLN
Freq: Once | INTRAVENOUS | Status: AC
  Administered 2015-07-10: 11:00:00 via INTRAVENOUS

## 2015-07-10 NOTE — Discharge Instructions (Signed)
Zoledronic Acid injection (Paget's Disease, Osteoporosis) °What is this medicine? °ZOLEDRONIC ACID (ZOE le dron ik AS id) lowers the amount of calcium loss from bone. It is used to treat Paget's disease and osteoporosis in women. °This medicine may be used for other purposes; ask your health care provider or pharmacist if you have questions. °What should I tell my health care provider before I take this medicine? °They need to know if you have any of these conditions: °-aspirin-sensitive asthma °-cancer, especially if you are receiving medicines used to treat cancer °-dental disease or wear dentures °-infection °-kidney disease °-low levels of calcium in the blood °-past surgery on the parathyroid gland or intestines °-receiving corticosteroids like dexamethasone or prednisone °-an unusual or allergic reaction to zoledronic acid, other medicines, foods, dyes, or preservatives °-pregnant or trying to get pregnant °-breast-feeding °How should I use this medicine? °This medicine is for infusion into a vein. It is given by a health care professional in a hospital or clinic setting. °Talk to your pediatrician regarding the use of this medicine in children. This medicine is not approved for use in children. °Overdosage: If you think you have taken too much of this medicine contact a poison control center or emergency room at once. °NOTE: This medicine is only for you. Do not share this medicine with others. °What if I miss a dose? °It is important not to miss your dose. Call your doctor or health care professional if you are unable to keep an appointment. °What may interact with this medicine? °-certain antibiotics given by injection °-NSAIDs, medicines for pain and inflammation, like ibuprofen or naproxen °-some diuretics like bumetanide, furosemide °-teriparatide °This list may not describe all possible interactions. Give your health care provider a list of all the medicines, herbs, non-prescription drugs, or dietary  supplements you use. Also tell them if you smoke, drink alcohol, or use illegal drugs. Some items may interact with your medicine. °What should I watch for while using this medicine? °Visit your doctor or health care professional for regular checkups. It may be some time before you see the benefit from this medicine. Do not stop taking your medicine unless your doctor tells you to. Your doctor may order blood tests or other tests to see how you are doing. °Women should inform their doctor if they wish to become pregnant or think they might be pregnant. There is a potential for serious side effects to an unborn child. Talk to your health care professional or pharmacist for more information. °You should make sure that you get enough calcium and vitamin D while you are taking this medicine. Discuss the foods you eat and the vitamins you take with your health care professional. °Some people who take this medicine have severe bone, joint, and/or muscle pain. This medicine may also increase your risk for jaw problems or a broken thigh bone. Tell your doctor right away if you have severe pain in your jaw, bones, joints, or muscles. Tell your doctor if you have any pain that does not go away or that gets worse. °Tell your dentist and dental surgeon that you are taking this medicine. You should not have major dental surgery while on this medicine. See your dentist to have a dental exam and fix any dental problems before starting this medicine. Take good care of your teeth while on this medicine. Make sure you see your dentist for regular follow-up appointments. °What side effects may I notice from receiving this medicine? °Side effects that you should   report to your doctor or health care professional as soon as possible: -allergic reactions like skin rash, itching or hives, swelling of the face, lips, or tongue -anxiety, confusion, or depression -breathing problems -changes in vision -eye pain -feeling faint or  lightheaded, falls -jaw pain, especially after dental work -mouth sores -muscle cramps, stiffness, or weakness -redness, blistering, peeling or loosening of the skin, including inside the mouth -trouble passing urine or change in the amount of urine Side effects that usually do not require medical attention (report to your doctor or health care professional if they continue or are bothersome): -bone, joint, or muscle pain -constipation -diarrhea -fever -hair loss -irritation at site where injected -loss of appetite -nausea, vomiting -stomach upset -trouble sleeping -trouble swallowing -weak or tired This list may not describe all possible side effects. Call your doctor for medical advice about side effects. You may report side effects to FDA at 1-800-FDA-1088. Where should I keep my medicine? This drug is given in a hospital or clinic and will not be stored at home. NOTE: This sheet is a summary. It may not cover all possible information. If you have questions about this medicine, talk to your doctor, pharmacist, or health care provider.    2016, Elsevier/Gold Standard. (2013-12-14 14:19:57)     Continue calcium and Vit D

## 2015-07-10 NOTE — Progress Notes (Signed)
Pt confirms she takes daily Vitamin D and calcium.  D/c instructions on Reclast given to pt.  Pt had reclast before.  Pt was d/c ambulatory to lobby with husband.

## 2015-08-05 ENCOUNTER — Other Ambulatory Visit: Payer: Self-pay | Admitting: Internal Medicine

## 2015-08-05 MED ORDER — BENZONATATE 200 MG PO CAPS: 200.0000 mg | ORAL_CAPSULE | Freq: Three times a day (TID) | ORAL | Status: DC | PRN

## 2015-09-28 ENCOUNTER — Ambulatory Visit (INDEPENDENT_AMBULATORY_CARE_PROVIDER_SITE_OTHER): Payer: Medicaid Other | Admitting: Cardiovascular Disease

## 2015-09-28 ENCOUNTER — Encounter: Payer: Self-pay | Admitting: Cardiovascular Disease

## 2015-09-28 VITALS — BP 124/70 | HR 57 | Ht 60.5 in | Wt 176.8 lb

## 2015-09-28 DIAGNOSIS — I5021 Acute systolic (congestive) heart failure: Secondary | ICD-10-CM | POA: Diagnosis not present

## 2015-09-28 NOTE — Progress Notes (Signed)
Cardiology Office Note   Date:  09/28/2015   ID:  [name], [name] 07-17-1951, MRN [id number]  PCP:  Velna Hatchet, MD  Cardiologist:   Thayer Headings, MD   Chief Complaint  Patient presents with  . Congestive Heart Failure   1. Takotsubo Syndrome  2. Normal heart catheterization 2006 3. Hyperlipidemia 4. Left knee pain   History of Present Illness: [name] [name] is a 65 y.o. female who presents for follow up of her Takotsubo syndrome.   At developed acute systolic congestive heart failure following her leg surgery in Nov, 2015.  [name] She apparently had diffuse chronic spasm resulting in Takotsubo syndrome. Her EF was low as 15-20%. She has improved significantly and now her EF has normallized  She has maintained sinus rhythm.  We started  her on carvedilol 3.125 mg twice a day and losartan 50 mg a day in Dec. 2015.  [name]  She has done very well.   No dyspnea , no CP .   March 23, 2015:  Doing well - had a takotsubo syndrome in Nov. 2015 following knee surgery   Occasional CP with activity  She has had a cath  Feb. 27, 2017:  Has hoarse voice today .  Either allergies or URI  No CP ,  Does have some chest tightness when she breaths    Past Medical History  Diagnosis Date  . Insomnia, unspecified   . Esophageal reflux   . Hyperlipemia   . Asthma   . Osteoporosis   . Arthritis   . COPD (chronic obstructive pulmonary disease) ([place])   . Depression   . Personal history of colonic polyps 09/2010    TUBULAR ADENOMAS (X3); NEGATIVE FOR HIGH GRADE DYSPLASIA OR MALIGNANCY.  . Barrett esophagus   . Hiatal hernia   . Esophageal stricture   . GERD (gastroesophageal reflux disease)   . GAD (generalized anxiety disorder)   . Aortic valve disorders   . Heart murmur   . Anxiety   . Shortness of breath     with exertion   . Pneumonia     hx of   . Type II or unspecified type diabetes mellitus without mention of complication, not stated as uncontrolled     on no meds   . Anemia     hx of years ago   . Hypertension 12/07/2012  . Myocardial infarction [facility]) 01-08-13    '06-Chest pain-no stent-dx. MI-stress related  . History of kidney stones 01-08-13    past hx.  . Transfusion history 01-08-13    past hx. many yrs ago  . Dysrhythmia     heart skips per pt   . Stroke ([place])     hx of 2 -remains with some right sided weaknes  . Urinary frequency     AND INCONTINENCE    Past Surgical History  Procedure Laterality Date  . Cholecystectomy  2005  . Partial hysterectomy    . Shoulder surgery  2011  . Tubal ligation    . Tonsillectomy    . Foot surgery  2005    Left foot  . Wrist flexion tendon tenotomies and proximal corpectomy w/ wrist arthrodesis&iliac crest bone graft    . Cardiac catheterization      normal per Dr Acie Fredrickson in note dated 02/06/12   . Knee arthroscopy  02/28/2012    Procedure: ARTHROSCOPY KNEE;  Surgeon: Tobi Bastos, MD;  Location: [facility];  Service: Orthopedics;  Laterality: Left;  .  Esophageal dilation  01-08-13    2 yrs ago  . Cataract extraction, bilateral    . Total knee arthroplasty Left 01/17/2013    Procedure: LEFT TOTAL KNEE ARTHROPLASTY;  Surgeon: Tobi Bastos, MD;  Location: [facility];  Service: Orthopedics;  Laterality: Left;  . Total knee arthroplasty Right 06/19/2014    Procedure: RIGHT TOTAL KNEE ARTHROPLASTY;  Surgeon: Tobi Bastos, MD;  Location: [facility];  Service: Orthopedics;  Laterality: Right;     Current Outpatient Prescriptions  Medication Sig Dispense Refill  . acetaminophen (TYLENOL) 325 MG tablet Take 1-2 tablets (325-650 mg total) by mouth every 4 (four) hours as needed for mild pain.    [name] albuterol (PROVENTIL HFA;VENTOLIN HFA) 108 (90 BASE) MCG/ACT inhaler Inhale 2 puffs into the lungs every 4 (four) hours as needed for wheezing or shortness of breath. Take two puffs four times a day as needed 1 Inhaler prn  . albuterol (PROVENTIL) (2.5 MG/3ML) 0.083% nebulizer solution Take 3 mLs (2.5 mg  total) by nebulization every 6 (six) hours as needed for wheezing. DX  491.9 360 mL 12  . ALPRAZolam (XANAX) 0.5 MG tablet Take 0.5 mg by mouth 3 (three) times daily.    [name] aspirin EC 81 MG tablet Take 81 mg by mouth every morning.     [name] atorvastatin (LIPITOR) 40 MG tablet Take 1 tablet (40 mg total) by mouth at bedtime. 30 tablet 0  . azelastine (ASTELIN) 0.1 % nasal spray Place 1 spray into both nostrils 2 (two) times daily. Use in each nostril as directed    . benzonatate (TESSALON) 200 MG capsule Take 1 capsule (200 mg total) by mouth 3 (three) times daily as needed for cough. 15 capsule 2  . calcium carbonate (OS-CAL) 600 MG TABS Take 600 mg by mouth at bedtime.     . carvedilol (COREG) 3.125 MG tablet Take 1 tablet (3.125 mg total) by mouth 2 (two) times daily. 180 tablet 3  . cholecalciferol (VITAMIN D) 1000 UNITS tablet Take 1,000 Units by mouth daily.    [name] dexlansoprazole (DEXILANT) 60 MG capsule Take 1 capsule (60 mg total) by mouth every morning. 30 capsule 5  . EPINEPHrine 0.3 mg/0.3 mL IJ SOAJ injection Inject 0.3 mLs (0.3 mg total) into the muscle as needed. (Patient taking differently: Inject 0.3 mg into the muscle as needed (ANAPHYLAXIS). ) 1 Device 1  . ferrous sulfate 325 (65 FE) MG tablet Take 1 tablet (325 mg total) by mouth 2 (two) times daily. 60 tablet 1  . fluticasone (FLONASE) 50 MCG/ACT nasal spray Place 2 sprays into the nose daily. 16 g prn  . furosemide (LASIX) 40 MG tablet Take 40 mg by mouth daily.    [name] losartan (COZAAR) 50 MG tablet Take 1 tablet (50 mg total) by mouth daily. 90 tablet 3  . Menthol (HONEY LEMON COUGH DROPS MT) Use as directed in the mouth or throat. Taking approx. 8-9 daily    . metFORMIN (GLUCOPHAGE) 500 MG tablet Take 1 tablet (500 mg total) by mouth 2 (two) times daily. 60 tablet 0  . mometasone-formoterol (DULERA) 200-5 MCG/ACT AERO 2 puffs then rinse mouth, twice daily 1 Inhaler prn  . montelukast (SINGULAIR) 10 MG tablet Take 1 tablet (10 mg  total) by mouth at bedtime. 30 tablet 11  . PARoxetine (PAXIL) 20 MG tablet Take 20 mg by mouth at bedtime.     . senna-docusate (SENOKOT-S) 8.6-50 MG per tablet Take 2 tablets by mouth 2 (two) times  daily. 100 tablet 0  . vitamin B-12 (CYANOCOBALAMIN) 250 MCG tablet Take 250 mcg by mouth daily.     No current facility-administered medications for this visit.   Facility-Administered Medications Ordered in Other Visits  Medication Dose Route Frequency Provider Last Rate Last Dose  . 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 Her smoking use included Cigarettes. She has a 10 pack-year smoking history. She quit smokeless tobacco use about 3 years ago. Her smokeless tobacco use included Snuff and Chew. She reports that she drinks about 0.6 oz of alcohol per week. She reports that she does not use illicit drugs.   Family History:  The patient's family history includes Breast cancer in her maternal aunt, maternal grandmother, and mother; Colon cancer (age of onset: 20) in her brother; Colon polyps in her brother; Diabetes in her maternal grandfather.    ROS:  Please see the history of present illness.    Review of Systems: Constitutional:  denies fever, chills, diaphoresis, appetite change and fatigue.  HEENT: denies photophobia, eye pain, redness, hearing loss, ear pain, congestion, sore throat, rhinorrhea, sneezing, neck pain, neck stiffness and tinnitus.  Respiratory: denies SOB, DOE, cough, chest tightness, and wheezing.  Cardiovascular: denies chest pain, palpitations and leg swelling.  Gastrointestinal: denies nausea, vomiting, abdominal pain, diarrhea, constipation, blood in stool.  Genitourinary: denies dysuria,  urgency, frequency, hematuria, flank pain and difficulty urinating.  Musculoskeletal: denies  myalgias, back pain, joint swelling, arthralgias and gait problem.   Skin: denies pallor, rash and wound.  Neurological: denies dizziness, seizures, syncope, weakness, light-headedness, numbness and headaches.   Hematological: denies adenopathy, easy bruising, personal or family bleeding history.  Psychiatric/ Behavioral: denies suicidal ideation, mood changes, confusion, nervousness, sleep disturbance and agitation.       All other systems are reviewed and negative.    PHYSICAL EXAM: VS:  BP 124/70 mmHg  Pulse 57  Ht 5' 0.5" (1.537 m)  Wt 176 lb 12.8 oz (80.196 kg)  BMI 33.95 kg/m2  SpO2 96% , BMI Body mass index is 33.95 kg/(m^2). GEN: Well nourished, well developed, in no acute distress HEENT: normal Neck: no JVD, carotid bruits, or masses Cardiac: RRR; no murmurs, rubs, or gallops,no edema  Respiratory:  A few rhonchi , wheezes  GI: soft, nontender, nondistended, + BS MS: no deformity or atrophy Skin: warm and dry, no rash Neuro:  Strength and sensation are intact Psych: normal   EKG:  EKG is ordered today. Sinus brady at 57.  LAD .   Recent Labs: No results found for requested labs within last 365 days.    Lipid Panel    Component Value Date/Time   CHOL 129 12/07/2012 1445   TRIG 147 12/07/2012 1445   HDL 40 12/07/2012 1445   LDLCALC 60 12/07/2012 1445      Wt Readings from Last 3 Encounters:  09/28/15 176 lb 12.8 oz (80.196 kg)  05/08/15 175 lb (79.379 kg)  03/23/15 171 lb (77.565 kg)      Other studies Reviewed: Additional studies/ records that were reviewed today include: . Review of the above records demonstrates:    ASSESSMENT AND PLAN:  1. Takotsubo Syndrome -  She developed diffuse coronary  spasm and Takotsubo  syndrome following her knee surgery in November. Her ejection fraction was as low as 15%. She has recovered quite nicely. She's now on  carvedilol and losartan. She seems to be feeling quite a bit better. Her ejection fraction has now normalized. We'll continue with supportive care. She has not been walking as much as I would like. I've encouraged her to get out and walk between 2 and 3 miles a day for times a week. I think this will help a lot with her endurance. I'll see her in 6 months.  2. Normal heart catheterization 2006  3. Hyperlipidemia -  Managed by her primary medical doctor. 4. Left knee pain   Current medicines are reviewed at length with the patient today.  The patient does not have concerns regarding medicines.  The following changes have been made:  no change   Disposition:   FU with 6 months     Signed, Nahser, Wonda Cheng, MD  09/28/2015 4:22 PM    [facility] [place], [place], [place]  [address] Phone: [phone]; Fax: [phone]

## 2015-09-28 NOTE — Patient Instructions (Signed)
Medication Instructions:  Your physician recommends that you continue on your current medications as directed. Please refer to the Current Medication list given to you today.   Labwork: None Ordered   Testing/Procedures: None Ordered   Follow-Up: Your physician wants you to follow-up in: 6 months with Dr. Nahser.  You will receive a reminder letter in the mail two months in advance. If you don't receive a letter, please call our office to schedule the follow-up appointment.   If you need a refill on your cardiac medications before your next appointment, please call your pharmacy.   Thank you for choosing [facility]! Margaret Staggs, RN [phone]    

## 2015-11-01 ENCOUNTER — Other Ambulatory Visit: Payer: Self-pay | Admitting: Nurse Practitioner

## 2015-11-06 ENCOUNTER — Ambulatory Visit (INDEPENDENT_AMBULATORY_CARE_PROVIDER_SITE_OTHER): Payer: Medicaid Other | Admitting: Internal Medicine

## 2015-11-06 ENCOUNTER — Encounter: Payer: Self-pay | Admitting: Internal Medicine

## 2015-11-06 VITALS — BP 112/70 | HR 51 | Ht 60.5 in | Wt 178.0 lb

## 2015-11-06 DIAGNOSIS — J309 Allergic rhinitis, unspecified: Secondary | ICD-10-CM | POA: Diagnosis not present

## 2015-11-06 DIAGNOSIS — J209 Acute bronchitis, unspecified: Secondary | ICD-10-CM | POA: Diagnosis not present

## 2015-11-06 DIAGNOSIS — J3089 Other allergic rhinitis: Secondary | ICD-10-CM

## 2015-11-06 DIAGNOSIS — R05 Cough: Secondary | ICD-10-CM | POA: Diagnosis not present

## 2015-11-06 DIAGNOSIS — R053 Chronic cough: Secondary | ICD-10-CM

## 2015-11-06 DIAGNOSIS — J42 Unspecified chronic bronchitis: Secondary | ICD-10-CM

## 2015-11-06 DIAGNOSIS — J302 Other seasonal allergic rhinitis: Secondary | ICD-10-CM

## 2015-11-06 MED ORDER — FLUTICASONE FUROATE-VILANTEROL 200-25 MCG/INH IN AEPB: 1.0000 | INHALATION_SPRAY | Freq: Every day | RESPIRATORY_TRACT | Status: DC

## 2015-11-06 NOTE — Progress Notes (Signed)
07/24/12- 63 yoF former smoker (06/24/12), former patient at old office,  Ref By Dr. Myers/ [facility] -- pt reports having prod cough w thick green mucus, pnd, chest tightness x4 weeks occuring mostly w weather change She reports cough and chest congestion off and on for years. Says she just stopped smoking in November of 2013. Now green sputum for 2 or 3 weeks with scratchy throat but no fever, blood or pain. Some cough even on good days. Occasional wheeze and she uses her rescue inhaler between 3 and 6 times daily. History of an allergic component and was on allergy vaccine years ago. Reports urticaria last week after steroid injection in her knee and shoulder, and also had flu vaccine about the same time. She treated herself with Benadryl successfully. She remains on Benadryl 50 mg twice daily. Has had hives before and "nervous". Several episodes of pneumonia. Aware of GERD symptoms despite Dexilant. CXR- 02/22/12 image reviewed IMPRESSION:  Negative exam.  Original Report Authenticated By: [name], M.D.   09/04/12- 31 yoF former smoker (06/24/12), former patient at old office,  Follows For: 4 week f/u - Non prod cough - Sinus and chest congestion - SOB worse with weather changes - Chest tightness and wheezing - Out of Advair for 2 weeks Advair sample helped. Dry cough. Postnasal drip with yellow nasal discharge. Hives resolved. Nebulizer does help. CXR 09/04/12 IMPRESSION:  No acute disease.  Original Report Authenticated By: [name], M.D.  11/05/12- 80 yoF former smoker (06/24/12), former patient at old office, followed for chronic bronchitis. FOLLOWS FOR: PFTs today-- pt reports breathing is "slightly worse" d/t weather, gets SOB easier, prod cough at times-- denies any other concerns at this time [name] sample worked well instead of advair. LOV nebulizer and depomedrol helped " a bunch", as did Tessalon for cough. Still smoking 4-5 cigarettes/ day and resists blaming this for any  airway problems. [name]. Also blames oil heat and lack of airconditioning in her home. Asks letter stating those are aggravating her breathing. Says she looks for opportunities to be out of house. CXR 09/04/12 IMPRESSION:  No acute disease.  Original Report Authenticated By: [name], M.D. PFT- 11/05/12- Normal except mild reduction of DLCO. No response to bronchodilator. FVC 2.17/ 83%, FEV1 1.76/ 91%, FEV1/FVC 0.81, TLC 93%, DLCO 68%.  05/07/13- 79 yoF former smoker (06/24/12), former patient at old office, followed for chronic bronchitis FOLLOWS ZOX:WRUEA more sob since last night,chest tightness mid chest, cough at hs -yellow green,denies wheezing and chest pain,says Dulera worked well,needs refills for [employer] Mostly coughs with colds. Admits he still smokes an occasional cigarette and we discussed smoking cessation.  11/06/13-  3 yoF former smoker (06/24/12), former patient at old office, followed for chronic bronchitis, complicated by DM2, hx CVA FOLLOWS FOR: Breathing is worse since season changed. Pollen is making things worse. Reports SOB, coughing with production of yellow/green mucus. 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 Chest tight.  05/07/14- 89 yoF former smoker (06/24/12), , followed for chronic bronchitis, allergic rhinitis complicated by DM2, hx CVA FOLLOW FOR: Chronic Bronchitis; currently on abx for inner ear infection; wants to discuss going on Singular for allergies;  sinus and chest congestion; non-productive cough  11/06/14-  63 yoF former smoker, , followed for chronic bronchitis, allergic rhinitis complicated by DM2, hx CVA FOLLOWS FOR: Pt states she is having slight chest tightness. Blames pollen for stuffy nose and chest tightness with mild wheeze, dyspnea on exertion. No chest pain or palpitation.  Quit smoking 6 months ago! She is remarried CXR 06/21/14 IMPRESSION: Continued stable interstitial prominence throughout the  lungs. Electronically Signed  By: Rolm Baptise M.D.  On: 06/21/2014 11:09  05/08/15-63 yoF former smoker, , followed for chronic bronchitis, allergic rhinitis complicated by DM2, hx CVA,, AFib FOLLOWS FOR: pt. states she has occ. SOB occ. wheezing. denies chest pain or tightness. dry cough x4 days. states allergies are rough. runny nose and cogestion.   Dry hacking cough for the past 3 days as she returns from honeymoon.  11/06/2015-65 year old female former smoker all of her chronic bronchitis, allergic rhinitis, complicated by  DM 2, history CVA, A. Fib FOLLOWS FOR:Pt states she is styaing tight in her chest,tired and weak-unsure if coming from allergies and pollen. Some respiratory chest tightness with increased cough. Not much wheezing. Albuterol helps. She has been out of [facility].  Eyes are mattered shut in the mornings "like sand".  ROS-see HPI Constitutional:   No-   weight loss, night sweats, fevers, chills, fatigue, lassitude. HEENT:   +  headaches, difficulty swallowing, tooth/dental problems, sore throat,       No-  Sneezing, itching, no-ear ache, +nasal congestion, +post nasal drip,  CV:  No-   chest pain, orthopnea, PND, swelling in lower extremities, anasarca, dizziness, +palpitations Resp: +  shortness of breath with exertion or at rest.              No-   productive cough,  + non-productive cough,  No- coughing up of blood.              No-   change in color of mucus.   wheezing.   Skin: No- rash or lesions. GI:  No- heartburn, indigestion, no-abdominal pain, nausea, vomiting,  GU: . MS:  + joint pain or swelling.   Neuro-     nothing unusual Psych:  No- change in mood or affect. + depression or anxiety.  No memory loss.  OBJ- Physical Exam General- Alert, Oriented, Affect-appropriate, Distress- none acute. + Overweight Skin- No rash Lymphadenopathy- none Head- atraumatic            Eyes- Gross vision intact, PERRLA, conjunctivae and secretions clear             Ears- Hearing, canals-normal            Nose- Clear, no-Septal dev, mucus, polyps, erosion, perforation             Throat- Mallampati III-IV , mucosa clear , drainage- none, tonsils- atrophic Neck- flexible , trachea midline, no stridor , thyroid nl, carotid no bruit Chest - symmetrical excursion , unlabored           Heart/CV- RRR , no murmur , no gallop  , no rub, nl s1 s2                           - JVD- none , edema- none, stasis changes- none, varices- none           Lung-  Cough+ with deep breath , dullness-none, rub- none, wheeze-none            Chest wall-  Abd-  Br/ Gen/ Rectal- Not done, not indicated Extrem- cyanosis- none, clubbing, none, atrophy- none, strength- nl Neuro- grossly intact to observation

## 2015-11-06 NOTE — Assessment & Plan Note (Signed)
Exacerbation probably related to seasonal pollen allergy although a viral component is not excluded. We discussed need for maintenance inhaler. She reports history of urticarial reaction to injected cortisone for joint problems but has tolerated steroid inhaler well. Plan-change Dulera to Breo 200

## 2015-11-06 NOTE — Assessment & Plan Note (Signed)
Some increased nasal pressure and drainage Plan-discussed antihistamines and available nasal sprays.

## 2015-11-06 NOTE — Patient Instructions (Signed)
Sample and script for Breo 200 inhaler    Inhale 1 puff, then rinse mouth, once daily.    This would be instead of the [facility] inhaler we talked about.  Ok to continue your nasal spray and other meds  Ok to add an antihistamine like Claritin/ loratadine, or Zyrtec/ cetirizine when needed for itching and drainage

## 2015-12-31 ENCOUNTER — Other Ambulatory Visit: Payer: Self-pay | Admitting: Internal Medicine

## 2016-01-26 ENCOUNTER — Ambulatory Visit (INDEPENDENT_AMBULATORY_CARE_PROVIDER_SITE_OTHER): Payer: Medicaid Other | Admitting: Cardiovascular Disease

## 2016-01-26 ENCOUNTER — Telehealth: Payer: Self-pay | Admitting: Cardiovascular Disease

## 2016-01-26 ENCOUNTER — Encounter: Payer: Self-pay | Admitting: Cardiovascular Disease

## 2016-01-26 VITALS — BP 112/60 | HR 57 | Ht 60.5 in | Wt 173.6 lb

## 2016-01-26 DIAGNOSIS — I10 Essential (primary) hypertension: Secondary | ICD-10-CM | POA: Diagnosis not present

## 2016-01-26 DIAGNOSIS — I4891 Unspecified atrial fibrillation: Secondary | ICD-10-CM | POA: Insufficient documentation

## 2016-01-26 DIAGNOSIS — I480 Paroxysmal atrial fibrillation: Secondary | ICD-10-CM

## 2016-01-26 DIAGNOSIS — R531 Weakness: Secondary | ICD-10-CM | POA: Insufficient documentation

## 2016-01-26 DIAGNOSIS — R0600 Dyspnea, unspecified: Secondary | ICD-10-CM

## 2016-01-26 NOTE — Patient Instructions (Addendum)
Medication Instructions:  Your physician recommends that you continue on your current medications as directed. Please refer to the Current Medication list given to you today.   Labwork: None Ordered   Testing/Procedures: Your physician has requested that you have an echocardiogram. Echocardiography is a painless test that uses sound waves to create images of your heart. It provides your doctor with information about the size and shape of your heart and how well your heart's chambers and valves are working. This procedure takes approximately one hour. There are no restrictions for this procedure.  Your physician has requested that you have a lexiscan myoview. For further information please visit [email or web address][employer][email or web address]. Please follow instruction sheet, as given.  Your physician has recommended that you wear an event monitor. Event monitors are medical devices that record the heart's electrical activity. Doctors most often [place] these monitors to diagnose arrhythmias. Arrhythmias are problems with the speed or rhythm of the heartbeat. The monitor is a small, portable device. You can wear one while you do your normal daily activities. This is usually used to diagnose what is causing palpitations/syncope (passing out).   Follow-Up: Your physician wants you to follow-up in: 3 months with Dr. Acie Fredrickson.  You will receive a reminder letter in the mail two months in advance. If you don't receive a letter, please call our office to schedule the follow-up appointment.   If you need a refill on your cardiac medications before your next appointment, please call your pharmacy.   Thank you for choosing [facility]! Christen Bame, RN [phone]

## 2016-01-26 NOTE — Progress Notes (Signed)
Cardiology Office Note   Date:  01/26/2016   ID:  [name] [name], [name] [name] [birth year], MRN [id number]  PCP:  Velna Hatchet, MD  Cardiologist:   Mertie Moores, MD   Chief Complaint  Patient presents with  . Chest Pain   1. Takotsubo Syndrome  2. Normal heart catheterization 2006 3. Hyperlipidemia 4. Left knee pain 5. Paroxysmal atrial fibrillation    History of Present Illness: [name] [name] is a 65 y.o. female who presents for follow up of her Takotsubo syndrome.   At developed acute systolic congestive heart failure following her leg surgery in Nov, 2015.  [name] She apparently had diffuse chronic spasm resulting in Takotsubo syndrome. Her EF was low as 15-20%. She has improved significantly and now her EF has normallized  She has maintained sinus rhythm.  We started  her on carvedilol 3.125 mg twice a day and losartan 50 mg a day in Dec. 2015.  [name]  She has done very well.   No dyspnea , no CP .   March 23, 2015:  Doing well - had a takotsubo syndrome in Nov. 2015 following knee surgery   Occasional CP with activity  She has had a cath  Feb. 27, 2017:  Has hoarse voice today .  Either allergies or URI  No CP ,  Does have some chest tightness when she breaths    January 26, 2016: [name] [name] is seen for work in visit.  Has episodes of tachycardia followed by    chest  tightness .  Worse with exertion .    Episodes last for several hours.  Better if she is able to get into the house and sit down.    Feels like her heart is going to beat our of her chest.  Has a hx of PAF in the past   Past Medical History  Diagnosis Date  . Insomnia, unspecified   . Esophageal reflux   . Hyperlipemia   . Asthma   . Osteoporosis   . Arthritis   . COPD (chronic obstructive pulmonary disease) ([place])   . Depression   . Personal history of colonic polyps 09/2010    TUBULAR ADENOMAS (X3); NEGATIVE FOR HIGH GRADE DYSPLASIA OR MALIGNANCY.  . Barrett esophagus   . Hiatal hernia   .  Esophageal stricture   . GERD (gastroesophageal reflux disease)   . GAD (generalized anxiety disorder)   . Aortic valve disorders   . Heart murmur   . Anxiety   . Shortness of breath     with exertion   . Pneumonia     hx of   . Type II or unspecified type diabetes mellitus without mention of complication, not stated as uncontrolled     on no meds   . Anemia     hx of years ago   . Hypertension 12/07/2012  . Myocardial infarction [facility]) 01-08-13    '06-Chest pain-no stent-dx. MI-stress related  . History of kidney stones 01-08-13    past hx.  . Transfusion history 01-08-13    past hx. many yrs ago  . Dysrhythmia     heart skips per pt   . Stroke ([place])     hx of 2 -remains with some right sided weaknes  . Urinary frequency     AND INCONTINENCE    Past Surgical History  Procedure Laterality Date  . Cholecystectomy  2005  . Partial hysterectomy    . Shoulder surgery  2011  . Tubal ligation    .  Tonsillectomy    . Foot surgery  2005    Left foot  . Wrist flexion tendon tenotomies and proximal corpectomy w/ wrist arthrodesis&iliac crest bone graft    . Cardiac catheterization      normal per Dr Acie Fredrickson in note dated 02/06/12   . Knee arthroscopy  02/28/2012    Procedure: ARTHROSCOPY KNEE;  Surgeon: Tobi Bastos, MD;  Location: [facility];  Service: Orthopedics;  Laterality: Left;  . Esophageal dilation  01-08-13    2 yrs ago  . Cataract extraction, bilateral    . Total knee arthroplasty Left 01/17/2013    Procedure: LEFT TOTAL KNEE ARTHROPLASTY;  Surgeon: Tobi Bastos, MD;  Location: [facility];  Service: Orthopedics;  Laterality: Left;  . Total knee arthroplasty Right 06/19/2014    Procedure: RIGHT TOTAL KNEE ARTHROPLASTY;  Surgeon: Tobi Bastos, MD;  Location: [facility];  Service: Orthopedics;  Laterality: Right;     Current Outpatient Prescriptions  Medication Sig Dispense Refill  . acetaminophen (TYLENOL) 325 MG tablet Take 1-2 tablets (325-650 mg total) by mouth every 4  (four) hours as needed for mild pain.    [name] albuterol (PROVENTIL HFA;VENTOLIN HFA) 108 (90 BASE) MCG/ACT inhaler Inhale 2 puffs into the lungs every 4 (four) hours as needed for wheezing or shortness of breath. Take two puffs four times a day as needed 1 Inhaler prn  . albuterol (PROVENTIL) (2.5 MG/3ML) 0.083% nebulizer solution Take 3 mLs (2.5 mg total) by nebulization every 6 (six) hours as needed for wheezing. DX  491.9 360 mL 12  . ALPRAZolam (XANAX) 0.5 MG tablet Take 0.5 mg by mouth 3 (three) times daily.    [name] aspirin EC 81 MG tablet Take 81 mg by mouth every morning.     [name] atorvastatin (LIPITOR) 40 MG tablet Take 1 tablet (40 mg total) by mouth at bedtime. 30 tablet 0  . azelastine (ASTELIN) 0.1 % nasal spray Place 1 spray into both nostrils 2 (two) times daily. Use in each nostril as directed    . benzonatate (TESSALON) 200 MG capsule TAKE 1 CAPSULE 3 TIMES DAILY AS NEEDED FOR COUGH 15 capsule 2  . calcium carbonate (OS-CAL) 600 MG TABS Take 600 mg by mouth at bedtime.     . carvedilol (COREG) 3.125 MG tablet Take 1 tablet (3.125 mg total) by mouth 2 (two) times daily. 180 tablet 3  . cholecalciferol (VITAMIN D) 1000 UNITS tablet Take 1,000 Units by mouth daily.    [name] dexlansoprazole (DEXILANT) 60 MG capsule Take 1 capsule (60 mg total) by mouth every morning. 30 capsule 5  . EPINEPHrine 0.3 mg/0.3 mL IJ SOAJ injection Inject 0.3 mLs (0.3 mg total) into the muscle as needed. (Patient taking differently: Inject 0.3 mg into the muscle as needed (ANAPHYLAXIS). ) 1 Device 1  . ferrous sulfate 325 (65 FE) MG tablet Take 1 tablet (325 mg total) by mouth 2 (two) times daily. 60 tablet 1  . fluticasone (FLONASE) 50 MCG/ACT nasal spray Place 2 sprays into the nose daily. 16 g prn  . fluticasone furoate-vilanterol (BREO ELLIPTA) 200-25 MCG/INH AEPB Inhale 1 puff into the lungs daily. 1 each 0  . furosemide (LASIX) 40 MG tablet Take 40 mg by mouth daily.    [name] losartan (COZAAR) 50 MG tablet Take 1 tablet  (50 mg total) by mouth daily. 90 tablet 3  . Menthol (HONEY LEMON COUGH DROPS MT) Use as directed in the mouth or throat. Taking approx. 8-9 daily    .  metFORMIN (GLUCOPHAGE) 500 MG tablet Take 1 tablet (500 mg total) by mouth 2 (two) times daily. (Patient taking differently: Take 500 mg by mouth daily with breakfast. ) 60 tablet 0  . montelukast (SINGULAIR) 10 MG tablet Take 1 tablet (10 mg total) by mouth at bedtime. 30 tablet 11  . PARoxetine (PAXIL) 20 MG tablet Take 20 mg by mouth at bedtime.     . senna-docusate (SENOKOT-S) 8.6-50 MG per tablet Take 2 tablets by mouth 2 (two) times daily. 100 tablet 0  . vitamin B-12 (CYANOCOBALAMIN) 250 MCG tablet Take 250 mcg by mouth daily.     No current facility-administered medications for this visit.   Facility-Administered Medications Ordered in Other Visits  Medication Dose Route Frequency Provider Last Rate Last Dose  . 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 Her smoking use included Cigarettes. She has a 10 pack-year smoking history. She quit smokeless tobacco use about 4 years ago. Her smokeless tobacco use included Snuff and Chew. She reports that she drinks about 0.6 oz of alcohol per week. She reports that she does not use illicit drugs.   Family History:  The patient's family history includes Breast cancer in her maternal aunt, maternal grandmother, and mother; Colon cancer (age of onset: 39) in her brother; Colon polyps in her brother; Diabetes in her maternal grandfather.    ROS:  Please see the history of present illness.    Review of Systems: Constitutional:  denies fever, chills, diaphoresis, appetite change and fatigue.  HEENT: denies photophobia, eye  pain, redness, hearing loss, ear pain, congestion, sore throat, rhinorrhea, sneezing, neck pain, neck stiffness and tinnitus.  Respiratory: denies SOB, DOE, cough, chest tightness, and wheezing.  Cardiovascular: denies chest pain, palpitations and leg swelling.  Gastrointestinal: denies nausea, vomiting, abdominal pain, diarrhea, constipation, blood in stool.  Genitourinary: denies dysuria, urgency, frequency, hematuria, flank pain and difficulty urinating.  Musculoskeletal: denies  myalgias, back pain, joint swelling, arthralgias and gait problem.   Skin: denies pallor, rash and wound.  Neurological: denies dizziness, seizures, syncope, weakness, light-headedness, numbness and headaches.   Hematological: denies adenopathy, easy bruising, personal or family bleeding history.  Psychiatric/ Behavioral: denies suicidal ideation, mood changes, confusion, nervousness, sleep disturbance and agitation.       All other systems are reviewed and negative.    PHYSICAL EXAM: VS:  BP 112/60 mmHg  Pulse 57  Ht 5' 0.5" (1.537 m)  Wt 173 lb 9.6 oz (78.744 kg)  BMI 33.33 kg/m2 , BMI Body mass index is 33.33 kg/(m^2). GEN: Well nourished, well developed, in no acute distress HEENT: normal Neck: no JVD, carotid bruits, or masses Cardiac: RRR; no murmurs, rubs, or gallops,no edema  Respiratory:  A few rhonchi , wheezes  GI: soft, nontender, nondistended, + BS MS: no deformity or atrophy Skin: warm and dry, no rash Neuro:  Strength and sensation are intact Psych: normal   EKG:  EKG is ordered today. Sinus brady at 57.  LAD .   Recent Labs: No results found for requested labs within last 365 days.    Lipid Panel    Component Value Date/Time   CHOL 129 12/07/2012 1445   TRIG 147 12/07/2012 1445  HDL 40 12/07/2012 1445   LDLCALC 60 12/07/2012 1445      Wt Readings from Last 3 Encounters:  01/26/16 173 lb 9.6 oz (78.744 kg)  11/06/15 178 lb (80.74 kg)  09/28/15 176 lb 12.8 oz (80.196  kg)      Other studies Reviewed: Additional studies/ records that were reviewed today include: . Review of the above records demonstrates:    ASSESSMENT AND PLAN:  1. Paroxysmal atrial fibrillation: The patient presents with episodes of tachycardia that are associated with severe weakness and some chest discomfort as well as severe dyspnea.   She has a known history of paroxysmal atrial fibrillation although she is in  sinus rhythm today. She has a history of congestive heart failure-Tikosyn dose syndrome.   She had a normal heart catheterization in 2006.  Given these issues, right to repeat her echocardiogram to ensure that her LV function is still normal. We will also schedule her for a Lexiscan Myoview study.  We'll place a 30 day event monitor to look for episodes of atrial fibrillation.  1. Takotsubo Syndrome -  She developed diffuse coronary  spasm and Takotsubo  syndrome following her knee surgery in November. Her ejection fraction was as low as 15%.  She improved on medical therapy  2. Normal heart catheterization 2006  3. Hyperlipidemia -  Managed by her primary medical doctor. 4. Left knee pain   Current medicines are reviewed at length with the patient today.  The patient does not have concerns regarding medicines.  The following changes have been made:  no change   Disposition:   FU with 3 months     Signed, Mertie Moores, MD  01/26/2016 4:49 PM    [address], [place], [place]  [address] Phone: [phone]; Fax: [phone]

## 2016-01-26 NOTE — Telephone Encounter (Signed)
Patient c/o Palpitations:  High priority if patient c/o lightheadedness and shortness of breath.  Pt stated her Heart has been in "high gear"- can feel it beat fast in her chest 1. How long have you been having palpitations? Last few days   2. Are you currently experiencing lightheadedness and shortness of breath? SOB- started a few days ago   3. Have you checked your BP and heart rate? (document readings) n/a   4. Are you experiencing any other symptoms? weakness

## 2016-01-26 NOTE — Telephone Encounter (Signed)
Spoke with patient who states she feels like her heart is racing at times when she is active and when she is sitting.  States all she wants to do is sleep; c/o feeling very fatigued, SOB, and weak.  States she feels completely "wiped out" if she just cooks a meal.  States if feels like something is sitting on her chest at times.  She states the tightness is better today.  She states she has gained 4 lbs in the last week but she is currently taking prednisone for her knee s/p total knee replacement.  She states sleeping on 2 pillows is her usual and nothing has changed in this regard. I advised her that she should proceed to the ED for evaluation as her symptoms could be coming from CHF, Afib, and/or CAD.  She asks if she could come to the office for evaluation first.  She has limited resources and would prefer to see Dr. Acie Fredrickson here if at all possible.  I advised her that she may still need to be sent to the hospital but Dr. Acie Fredrickson is agreeable to see her at 3:45 today.  She verbalized understanding and agreement and thanked me for the call.

## 2016-01-30 DIAGNOSIS — Z9289 Personal history of other medical treatment: Secondary | ICD-10-CM

## 2016-01-30 HISTORY — DX: Personal history of other medical treatment: Z92.89

## 2016-02-17 ENCOUNTER — Telehealth (HOSPITAL_COMMUNITY): Payer: Self-pay | Admitting: *Deleted

## 2016-02-17 NOTE — Telephone Encounter (Signed)
error 

## 2016-02-17 NOTE — Telephone Encounter (Signed)
Patient given detailed instructions per Myocardial Perfusion Study Information Sheet for the test on 02/22/16. Patient notified to arrive 15 minutes early and that it is imperative to arrive on time for appointment to keep from having the test rescheduled.  If you need to cancel or reschedule your appointment, please call the office within 24 hours of your appointment. Failure to do so may result in a cancellation of your appointment, and a $50 no show fee. Patient verbalized understanding. Orval Dortch J Arminta Gamm, RN   

## 2016-02-22 ENCOUNTER — Ambulatory Visit (HOSPITAL_BASED_OUTPATIENT_CLINIC_OR_DEPARTMENT_OTHER): Payer: Medicaid Other

## 2016-02-22 ENCOUNTER — Ambulatory Visit (INDEPENDENT_AMBULATORY_CARE_PROVIDER_SITE_OTHER): Payer: Medicaid Other

## 2016-02-22 ENCOUNTER — Ambulatory Visit (HOSPITAL_COMMUNITY): Payer: Medicaid Other | Attending: Internal Medicine

## 2016-02-22 ENCOUNTER — Other Ambulatory Visit (HOSPITAL_COMMUNITY): Payer: Self-pay

## 2016-02-22 VITALS — Ht 60.0 in | Wt 173.0 lb

## 2016-02-22 DIAGNOSIS — J449 Chronic obstructive pulmonary disease, unspecified: Secondary | ICD-10-CM | POA: Diagnosis not present

## 2016-02-22 DIAGNOSIS — I10 Essential (primary) hypertension: Secondary | ICD-10-CM | POA: Diagnosis not present

## 2016-02-22 DIAGNOSIS — R110 Nausea: Secondary | ICD-10-CM

## 2016-02-22 DIAGNOSIS — E785 Hyperlipidemia, unspecified: Secondary | ICD-10-CM | POA: Diagnosis not present

## 2016-02-22 DIAGNOSIS — E119 Type 2 diabetes mellitus without complications: Secondary | ICD-10-CM | POA: Diagnosis not present

## 2016-02-22 DIAGNOSIS — I252 Old myocardial infarction: Secondary | ICD-10-CM | POA: Insufficient documentation

## 2016-02-22 DIAGNOSIS — R0602 Shortness of breath: Secondary | ICD-10-CM

## 2016-02-22 DIAGNOSIS — Z8249 Family history of ischemic heart disease and other diseases of the circulatory system: Secondary | ICD-10-CM | POA: Diagnosis not present

## 2016-02-22 DIAGNOSIS — I480 Paroxysmal atrial fibrillation: Secondary | ICD-10-CM

## 2016-02-22 DIAGNOSIS — R0600 Dyspnea, unspecified: Secondary | ICD-10-CM | POA: Diagnosis not present

## 2016-02-22 MED ORDER — AMINOPHYLLINE 25 MG/ML IV SOLN
75.0000 mg | Freq: Once | INTRAVENOUS | Status: AC
  Administered 2016-02-22: 75 mg via INTRAVENOUS

## 2016-02-22 MED ORDER — REGADENOSON 0.4 MG/5ML IV SOLN
0.4000 mg | Freq: Once | INTRAVENOUS | Status: AC
  Administered 2016-02-22: 0.4 mg via INTRAVENOUS

## 2016-02-22 MED ORDER — TECHNETIUM TC 99M TETROFOSMIN IV KIT
33.0000 | PACK | Freq: Once | INTRAVENOUS | Status: AC | PRN
  Administered 2016-02-22: 33 via INTRAVENOUS
  Filled 2016-02-22: qty 33

## 2016-02-23 ENCOUNTER — Encounter (HOSPITAL_COMMUNITY): Payer: Medicaid Other | Attending: Cardiology

## 2016-02-23 LAB — MYOCARDIAL PERFUSION IMAGING
CHL CUP NUCLEAR SSS: 12
CSEPPHR: 85 {beats}/min
LV dias vol: 87 mL (ref 46–106)
LVSYSVOL: 35 mL
NUC STRESS TID: 1.09
RATE: 0.35
Rest HR: 60 {beats}/min
SDS: 3
SRS: 9

## 2016-02-23 MED ORDER — TECHNETIUM TC 99M TETROFOSMIN IV KIT
31.9000 | PACK | Freq: Once | INTRAVENOUS | Status: AC | PRN
  Administered 2016-02-23: 31.9 via INTRAVENOUS
  Filled 2016-02-23: qty 32

## 2016-02-24 ENCOUNTER — Observation Stay (HOSPITAL_COMMUNITY): Payer: Medicaid Other

## 2016-02-24 ENCOUNTER — Other Ambulatory Visit (HOSPITAL_COMMUNITY): Payer: Medicaid Other

## 2016-02-24 ENCOUNTER — Emergency Department (HOSPITAL_COMMUNITY): Payer: Medicaid Other

## 2016-02-24 ENCOUNTER — Telehealth: Payer: Self-pay | Admitting: Internal Medicine

## 2016-02-24 ENCOUNTER — Other Ambulatory Visit: Payer: Self-pay

## 2016-02-24 ENCOUNTER — Observation Stay (HOSPITAL_COMMUNITY)
Admission: EM | Admit: 2016-02-24 | Discharge: 2016-02-25 | Disposition: A | Discharge status: Home / Self Care | Payer: Medicaid Other | Attending: Internal Medicine | Admitting: Internal Medicine

## 2016-02-24 ENCOUNTER — Encounter (HOSPITAL_COMMUNITY): Payer: Self-pay | Admitting: Emergency Medicine

## 2016-02-24 DIAGNOSIS — R55 Syncope and collapse: Secondary | ICD-10-CM | POA: Diagnosis present

## 2016-02-24 DIAGNOSIS — R0602 Shortness of breath: Secondary | ICD-10-CM | POA: Insufficient documentation

## 2016-02-24 DIAGNOSIS — E119 Type 2 diabetes mellitus without complications: Secondary | ICD-10-CM | POA: Insufficient documentation

## 2016-02-24 DIAGNOSIS — R2990 Unspecified symptoms and signs involving the nervous system: Secondary | ICD-10-CM | POA: Diagnosis present

## 2016-02-24 DIAGNOSIS — J449 Chronic obstructive pulmonary disease, unspecified: Secondary | ICD-10-CM | POA: Diagnosis not present

## 2016-02-24 DIAGNOSIS — F17210 Nicotine dependence, cigarettes, uncomplicated: Secondary | ICD-10-CM | POA: Insufficient documentation

## 2016-02-24 DIAGNOSIS — Z96653 Presence of artificial knee joint, bilateral: Secondary | ICD-10-CM | POA: Diagnosis not present

## 2016-02-24 DIAGNOSIS — Z7984 Long term (current) use of oral hypoglycemic drugs: Secondary | ICD-10-CM | POA: Diagnosis not present

## 2016-02-24 DIAGNOSIS — G459 Transient cerebral ischemic attack, unspecified: Secondary | ICD-10-CM | POA: Diagnosis not present

## 2016-02-24 DIAGNOSIS — F419 Anxiety disorder, unspecified: Secondary | ICD-10-CM | POA: Diagnosis present

## 2016-02-24 DIAGNOSIS — I10 Essential (primary) hypertension: Secondary | ICD-10-CM | POA: Diagnosis not present

## 2016-02-24 DIAGNOSIS — E118 Type 2 diabetes mellitus with unspecified complications: Secondary | ICD-10-CM | POA: Diagnosis not present

## 2016-02-24 DIAGNOSIS — E785 Hyperlipidemia, unspecified: Secondary | ICD-10-CM | POA: Diagnosis present

## 2016-02-24 DIAGNOSIS — I209 Angina pectoris, unspecified: Secondary | ICD-10-CM

## 2016-02-24 DIAGNOSIS — Z8719 Personal history of other diseases of the digestive system: Secondary | ICD-10-CM

## 2016-02-24 DIAGNOSIS — R079 Chest pain, unspecified: Secondary | ICD-10-CM | POA: Diagnosis present

## 2016-02-24 DIAGNOSIS — I5021 Acute systolic (congestive) heart failure: Secondary | ICD-10-CM

## 2016-02-24 DIAGNOSIS — Z7982 Long term (current) use of aspirin: Secondary | ICD-10-CM | POA: Insufficient documentation

## 2016-02-24 DIAGNOSIS — I252 Old myocardial infarction: Secondary | ICD-10-CM | POA: Diagnosis not present

## 2016-02-24 DIAGNOSIS — I4891 Unspecified atrial fibrillation: Secondary | ICD-10-CM | POA: Diagnosis present

## 2016-02-24 DIAGNOSIS — E876 Hypokalemia: Secondary | ICD-10-CM | POA: Diagnosis present

## 2016-02-24 DIAGNOSIS — I480 Paroxysmal atrial fibrillation: Secondary | ICD-10-CM | POA: Diagnosis not present

## 2016-02-24 LAB — BASIC METABOLIC PANEL
ANION GAP: 11 (ref 5–15)
BUN: 9 mg/dL (ref 6–20)
CO2: 24 mmol/L (ref 22–32)
Calcium: 9 mg/dL (ref 8.9–10.3)
Chloride: 105 mmol/L (ref 101–111)
Creatinine, Ser: 0.7 mg/dL (ref 0.44–1.00)
GFR calc Af Amer: >60 mL/min (ref >60)
GLUCOSE: 133 mg/dL — AB (ref 65–99)
POTASSIUM: 2.8 mmol/L — AB (ref 3.5–5.1)
Sodium: 140 mmol/L (ref 135–145)

## 2016-02-24 LAB — GLUCOSE, CAPILLARY
GLUCOSE-CAPILLARY: 250 mg/dL — AB (ref 65–99)
GLUCOSE-CAPILLARY: 218 mg/dL — AB (ref 65–99)
Glucose-Capillary: 198 mg/dL — ABNORMAL HIGH (ref 65–99)

## 2016-02-24 LAB — PROTIME-INR
INR: 1.1
Prothrombin Time: 14.4 seconds (ref 11.4–15.2)

## 2016-02-24 LAB — MAGNESIUM: MAGNESIUM: 1.7 mg/dL (ref 1.7–2.4)

## 2016-02-24 LAB — CBC
HEMATOCRIT: 38.3 % (ref 36.0–46.0)
HEMOGLOBIN: 12.4 g/dL (ref 12.0–15.0)
MCH: 29.9 pg (ref 26.0–34.0)
MCHC: 32.4 g/dL (ref 30.0–36.0)
MCV: 92.3 fL (ref 78.0–100.0)
Platelets: 220 10*3/uL (ref 150–400)
RBC: 4.15 MIL/uL (ref 3.87–5.11)
RDW: 13.9 % (ref 11.5–15.5)
WBC: 7.2 10*3/uL (ref 4.0–10.5)

## 2016-02-24 LAB — TROPONIN I: Troponin I: <0.03 ng/mL (ref <0.03)

## 2016-02-24 LAB — APTT: aPTT: 33 seconds (ref 24–36)

## 2016-02-24 LAB — I-STAT TROPONIN, ED: Troponin i, poc: 0 ng/mL (ref 0.00–0.08)

## 2016-02-24 LAB — BRAIN NATRIURETIC PEPTIDE: B NATRIURETIC PEPTIDE 5: 48.6 pg/mL (ref 0.0–100.0)

## 2016-02-24 MED ORDER — GI COCKTAIL ~~LOC~~: 30.0000 mL | Freq: Four times a day (QID) | ORAL | Status: DC | PRN

## 2016-02-24 MED ORDER — ACETAMINOPHEN 650 MG RE SUPP: 650.0000 mg | RECTAL | Status: DC | PRN

## 2016-02-24 MED ORDER — NITROGLYCERIN 0.4 MG SL SUBL: 0.4000 mg | SUBLINGUAL_TABLET | SUBLINGUAL | Status: DC | PRN

## 2016-02-24 MED ORDER — ALBUTEROL SULFATE HFA 108 (90 BASE) MCG/ACT IN AERS: 2.0000 | INHALATION_SPRAY | RESPIRATORY_TRACT | Status: DC | PRN

## 2016-02-24 MED ORDER — MAGNESIUM SULFATE 2 GM/50ML IV SOLN
2.0000 g | Freq: Once | INTRAVENOUS | Status: AC
  Administered 2016-02-24: 2 g via INTRAVENOUS
  Filled 2016-02-24: qty 50

## 2016-02-24 MED ORDER — MONTELUKAST SODIUM 10 MG PO TABS
10.0000 mg | ORAL_TABLET | Freq: Every day | ORAL | Status: DC
  Administered 2016-02-24 – 2016-02-25 (×2): 10 mg via ORAL
  Filled 2016-02-24 (×2): qty 1

## 2016-02-24 MED ORDER — POTASSIUM CHLORIDE CRYS ER 20 MEQ PO TBCR
60.0000 meq | EXTENDED_RELEASE_TABLET | Freq: Once | ORAL | Status: DC
  Filled 2016-02-24: qty 3

## 2016-02-24 MED ORDER — FLUTICASONE PROPIONATE 50 MCG/ACT NA SUSP
2.0000 | Freq: Every day | NASAL | Status: DC
  Administered 2016-02-24 – 2016-02-25 (×2): 2 via NASAL
  Filled 2016-02-24: qty 16

## 2016-02-24 MED ORDER — ASPIRIN 325 MG PO TABS: 325.0000 mg | ORAL_TABLET | Freq: Once | ORAL | Status: DC

## 2016-02-24 MED ORDER — POTASSIUM CHLORIDE CRYS ER 20 MEQ PO TBCR
40.0000 meq | EXTENDED_RELEASE_TABLET | Freq: Once | ORAL | Status: AC
  Administered 2016-02-24: 40 meq via ORAL
  Filled 2016-02-24: qty 2

## 2016-02-24 MED ORDER — IOPAMIDOL (ISOVUE-370) INJECTION 76%
INTRAVENOUS | Status: AC
  Administered 2016-02-24: 100 mL
  Filled 2016-02-24: qty 100

## 2016-02-24 MED ORDER — FERROUS SULFATE 325 (65 FE) MG PO TABS
325.0000 mg | ORAL_TABLET | Freq: Two times a day (BID) | ORAL | Status: DC
  Administered 2016-02-24 – 2016-02-25 (×4): 325 mg via ORAL
  Filled 2016-02-24 (×4): qty 1

## 2016-02-24 MED ORDER — PAROXETINE HCL 20 MG PO TABS
20.0000 mg | ORAL_TABLET | Freq: Every day | ORAL | Status: DC
  Administered 2016-02-24 – 2016-02-25 (×2): 20 mg via ORAL
  Filled 2016-02-24 (×2): qty 1

## 2016-02-24 MED ORDER — LOSARTAN POTASSIUM 50 MG PO TABS
50.0000 mg | ORAL_TABLET | Freq: Every day | ORAL | Status: DC
  Administered 2016-02-24 – 2016-02-25 (×2): 50 mg via ORAL
  Filled 2016-02-24 (×2): qty 1

## 2016-02-24 MED ORDER — FLUTICASONE FUROATE-VILANTEROL 200-25 MCG/INH IN AEPB
1.0000 | INHALATION_SPRAY | Freq: Every day | RESPIRATORY_TRACT | Status: DC
  Administered 2016-02-25: 1 via RESPIRATORY_TRACT
  Filled 2016-02-24: qty 28

## 2016-02-24 MED ORDER — CARVEDILOL 3.125 MG PO TABS
3.1250 mg | ORAL_TABLET | Freq: Two times a day (BID) | ORAL | Status: DC
  Administered 2016-02-24 – 2016-02-25 (×3): 3.125 mg via ORAL
  Filled 2016-02-24 (×3): qty 1

## 2016-02-24 MED ORDER — GADOBENATE DIMEGLUMINE 529 MG/ML IV SOLN
20.0000 mL | Freq: Once | INTRAVENOUS | Status: AC
  Administered 2016-02-24: 16 mL via INTRAVENOUS

## 2016-02-24 MED ORDER — FUROSEMIDE 40 MG PO TABS: 40.0000 mg | ORAL_TABLET | Freq: Every day | ORAL | Status: DC

## 2016-02-24 MED ORDER — HYDRALAZINE HCL 20 MG/ML IJ SOLN: 5.0000 mg | Freq: Three times a day (TID) | INTRAMUSCULAR | Status: DC | PRN

## 2016-02-24 MED ORDER — ACETAMINOPHEN 325 MG PO TABS: 650.0000 mg | ORAL_TABLET | ORAL | Status: DC | PRN

## 2016-02-24 MED ORDER — ALPRAZOLAM 0.5 MG PO TABS
0.5000 mg | ORAL_TABLET | Freq: Three times a day (TID) | ORAL | Status: DC
  Administered 2016-02-24 – 2016-02-25 (×5): 0.5 mg via ORAL
  Filled 2016-02-24 (×5): qty 1

## 2016-02-24 MED ORDER — POTASSIUM CHLORIDE 10 MEQ/100ML IV SOLN
10.0000 meq | INTRAVENOUS | Status: AC
  Administered 2016-02-24 (×2): 10 meq via INTRAVENOUS
  Filled 2016-02-24 (×2): qty 100

## 2016-02-24 MED ORDER — ATORVASTATIN CALCIUM 40 MG PO TABS
40.0000 mg | ORAL_TABLET | Freq: Every day | ORAL | Status: DC
  Administered 2016-02-24: 40 mg via ORAL
  Filled 2016-02-24: qty 1

## 2016-02-24 MED ORDER — ALBUTEROL SULFATE (2.5 MG/3ML) 0.083% IN NEBU: 2.5000 mg | INHALATION_SOLUTION | Freq: Four times a day (QID) | RESPIRATORY_TRACT | Status: DC | PRN

## 2016-02-24 MED ORDER — AZELASTINE HCL 0.1 % NA SOLN
1.0000 | Freq: Two times a day (BID) | NASAL | Status: DC
  Administered 2016-02-24 – 2016-02-25 (×4): 1 via NASAL
  Filled 2016-02-24: qty 30

## 2016-02-24 MED ORDER — ASPIRIN EC 81 MG PO TBEC
81.0000 mg | DELAYED_RELEASE_TABLET | Freq: Every morning | ORAL | Status: DC
  Administered 2016-02-25: 81 mg via ORAL
  Filled 2016-02-24 (×2): qty 1

## 2016-02-24 MED ORDER — SODIUM CHLORIDE 0.9 % IV SOLN
INTRAVENOUS | Status: DC
  Administered 2016-02-24: 13:00:00 via INTRAVENOUS

## 2016-02-24 MED ORDER — SENNOSIDES-DOCUSATE SODIUM 8.6-50 MG PO TABS: 2.0000 | ORAL_TABLET | Freq: Two times a day (BID) | ORAL | Status: DC | PRN

## 2016-02-24 MED ORDER — ONDANSETRON HCL 4 MG/2ML IJ SOLN: 4.0000 mg | Freq: Four times a day (QID) | INTRAMUSCULAR | Status: DC | PRN

## 2016-02-24 MED ORDER — PANTOPRAZOLE SODIUM 40 MG PO TBEC
40.0000 mg | DELAYED_RELEASE_TABLET | Freq: Every day | ORAL | Status: DC
  Administered 2016-02-24 – 2016-02-25 (×2): 40 mg via ORAL
  Filled 2016-02-24 (×2): qty 1

## 2016-02-24 MED ORDER — INSULIN ASPART 100 UNIT/ML ~~LOC~~ SOLN
0.0000 [IU] | Freq: Three times a day (TID) | SUBCUTANEOUS | Status: DC
  Administered 2016-02-24: 3 [IU] via SUBCUTANEOUS
  Administered 2016-02-25: 5 [IU] via SUBCUTANEOUS
  Administered 2016-02-25 (×2): 1 [IU] via SUBCUTANEOUS

## 2016-02-24 MED ORDER — FUROSEMIDE 40 MG PO TABS
40.0000 mg | ORAL_TABLET | Freq: Every day | ORAL | Status: DC
  Administered 2016-02-24 – 2016-02-25 (×2): 40 mg via ORAL
  Filled 2016-02-24 (×2): qty 1

## 2016-02-24 NOTE — ED Notes (Signed)
Attempted report 

## 2016-02-24 NOTE — Evaluation (Signed)
Physical Therapy Evaluation Patient Details Name: [name] [name] MRN: [id number] DOB: 16-Aug-1950 Today's Date: 02/24/2016   History of Present Illness  65 y.o. female admitted to [name] on 02/24/16 for chest pain, dizziness, right arm weakness.  Stroke and cardiac workup pending.  MRI was negative for acute events.  Pt with significant PMHx of DM, SOB, MI, HTN, COPD, aortic valve disorder, anemia, Bil TKA, shoulder surgery, and L foot surgery.  Clinical Impression  Pt was able to walk at what seemed her normal gait pattern, however, she had an abnormal amount of DOE during gait (2-3/4) which is more than normal and a dry cough.  O2 sats and HR remained stable on RA, but pt had to take multiple standing rest breaks to catch her breath. It is for this reason that PT will be following acutely to help with energy conservation education and activity progression.   PT to follow acutely for deficits listed below.       Follow Up Recommendations No PT follow up    Equipment Recommendations  None recommended by PT    Recommendations for Other Services   NA    Precautions / Restrictions Precautions Precautions: Fall Precaution Comments: pt with remote h/o falls      Mobility  Bed Mobility Overal bed mobility: Modified Independent                Transfers Overall transfer level: Modified independent Equipment used: None                Ambulation/Gait Ambulation/Gait assistance: Min guard Ambulation Distance (Feet): 300 Feet Assistive device: None Gait Pattern/deviations: Step-through pattern (almost vaulting)     General Gait Details: Pt has an abnormal gait pattern at baseline due to multiple orthopedic surgeries on bil knees and foot.  What was more abnormal for her during gait was a significant increase in DOE compared to normal.  O2 sats 94-99 % on RA despite DOE 2-3/4 during gait (especially second half of gait) requiring three standing rest breaks to regain her  breathing with episodes of dry coughing when aske to pursed lip breathe to slow down dyspnea.            Balance Overall balance assessment: Needs assistance Sitting-balance support: Feet supported;No upper extremity supported Sitting balance-Leahy Scale: Good     Standing balance support: No upper extremity supported Standing balance-Leahy Scale: Good                               Pertinent Vitals/Pain Pain Assessment: No/denies pain    Home Living Family/patient expects to be discharged to:: Private residence Living Arrangements: Spouse/significant other Available Help at Discharge: Family;Available 24 hours/day Type of Home: House Home Access: Stairs to enter Entrance Stairs-Rails: None Entrance Stairs-Number of Steps: 2 Home Layout: Laundry or work area in 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;Cane - single point      Prior Function Level of Independence: Independent with assistive device(s)         Comments: uses cane or walker when she "feels lightheaded or unstable"      Hand Dominance   Dominant Hand: Right ("both")    Extremity/Trunk Assessment   Upper Extremity Assessment: Defer to OT evaluation           Lower Extremity Assessment: Overall WFL for tasks assessed (h/o bil TKA)      Cervical / Trunk Assessment: Other exceptions  Communication  Communication: No difficulties  Cognition Arousal/Alertness: Awake/alert Behavior During Therapy: WFL for tasks assessed/performed Overall Cognitive Status: Within Functional Limits for tasks assessed                               Assessment/Plan    PT Assessment Patient needs continued PT services  PT Diagnosis Difficulty walking;Generalized weakness   PT Problem List Decreased activity tolerance;Decreased mobility;Cardiopulmonary status limiting activity  PT Treatment Interventions DME instruction;Gait training;Stair training;Functional mobility  training;Therapeutic activities;Therapeutic exercise;Balance training;Neuromuscular re-education;Patient/family education   PT Goals (Current goals can be found in the Care Plan section) Acute Rehab PT Goals Patient Stated Goal: to make sure her chest pain goes away PT Goal Formulation: With patient Time For Goal Achievement: 03/09/16 Potential to Achieve Goals: Good    Frequency Min 3X/week           End of Session Equipment Utilized During Treatment: Gait belt Activity Tolerance: Patient limited by fatigue;Treatment limited secondary to medical complications (Comment);Other (comment) (limited by increased DOE and coughing during gait) Patient left: in chair;with call bell/phone within reach;with chair alarm set;with family/visitor present Nurse Communication: Mobility status    Functional Assessment Tool Used: assist level Functional Limitation: Mobility: Walking and moving around Mobility: Walking and Moving Around Current Status [id number]): At least 1 percent but less than 20 percent impaired, limited or restricted Mobility: Walking and Moving Around Goal Status [phone]): 0 percent impaired, limited or restricted    Time: 1536-1601 PT Time Calculation (min) (ACUTE ONLY): 25 min   Charges:   PT Evaluation $PT Eval Moderate Complexity: 1 Procedure PT Treatments $Gait Training: 8-22 mins   PT G Codes:   PT G-Codes **NOT FOR INPATIENT CLASS** Functional Assessment Tool Used: assist level Functional Limitation: Mobility: Walking and moving around Mobility: Walking and Moving Around Current Status [id number]): At least 1 percent but less than 20 percent impaired, limited or restricted Mobility: Walking and Moving Around Goal Status [phone]): 0 percent impaired, limited or restricted    [name] [name] [name] [name], [name] [name], DPT [phone]   02/24/2016, 5:10 PM

## 2016-02-24 NOTE — Telephone Encounter (Signed)
Pt is in the ER for evaluation

## 2016-02-24 NOTE — ED Notes (Signed)
Pt. Transported to MRI at this time and to [place] after that.

## 2016-02-24 NOTE — Progress Notes (Signed)
Pt arrived to the unit form the ed. Pt oriented to room and equipment. Will cont to monitor pt.

## 2016-02-24 NOTE — ED Notes (Addendum)
Pt noted to be using LEFT arm to pick up RIGHT arm; pt squeezed this RNs fingers- weakness noted in pts RIGHT side; pt reporting RIGHT sided weakness began yesterday in addition to RLE sensory deficit; no facial droop, slurred speech noted

## 2016-02-24 NOTE — ED Triage Notes (Signed)
Pt brought to ED by [facility] from home for c/o CP, new A-fin, diaphoretic and dizzy near syncope on [facility] arrival to her house, on the way to ED pt had a run of SVT around 10 beats with a HR 144, 324 mg ASA given by EMS PTA. BP 133/74, HR 77, R 22, SPO2 98% RA. 3/10 CP. PT AO x 4. NAD noticed.

## 2016-02-24 NOTE — H&P (Signed)
History and Physical    [name] [name] [id number] DOB: 21-Feb-1951 DOA: 02/24/2016   PCP: Velna Hatchet, MD   Patient coming from:  Home    Chief Complaint: Chest pain                                Dizziness with R arm weakness  HPI: [name] [name] is a 65 y.o. female with medical history significant for HTN, HLD, anxiety, depression, atrial fibrillation, diabetes, GERD, COPD,osteoarthritis, CAD status post   Normal cardiac cath 2006 ,history of stroke in 2015with residual right-sided weakness, presenting to the emergency department via EMS with sudden onset of intermittent midsternal chest pain waking her up, with associated shortness of breath. At the time of transport, the patient was notably diaphoretic, had dizziness, and felt to be near syncopal. She denies any palpitations. On the way to the emergency department, the patient suffered a run of SVT's for about 10 beats, with a heart rate in the 140s. Chest pain eventually subsided after administering 324 mg of aspirin. She continued to experience intermittent dizziness. In addition, the patient complained of worsening right-sided arm weakness with right shoulder pain for about 2-3 days. She also reports trouble finding words over the last 3 days.She denies any vision changes such as blurred or double vision. She denies any headaches. She denies any nausea or vomiting. Denies any fever or chills, or night sweats. Does not smoke, drink or partake any  recreational drugs.she takes place daily 81 mg of aspirin. No new meds or hormonal supplements. Denies any recent long distance trips. She denies any GI or GU complaints such as incontinence or retention. She denies any bleeding issues. Of note, the patient has strong family of CVA in the maternal side, and cardiac disease in her paternal side.   ED Course:  BP 119/61   Pulse 82   Temp 97.4 F (36.3 C) (Oral)   Resp 19   Ht 5' 0.5" (1.537 m)   Wt 78 kg (172 lb)   SpO2 96%   BMI  33.04 kg/m   CT angiogram of the chest negative for PE. CT of the head is negative  potassium 2.9 glucose 133 troponin 0 BNP 48.6 magnesium 1.7 EKG with atrial fibrillation, nonocclusive left bundle branch block, QTc 483, no acute coronary issues Last 2-D echo performed on 02/22/2016, with normal LV function and diastolic function, ejection fraction 55-60% Last stress test performed on 02/23/2015,  normal.   Review of Systems: As per HPI otherwise 10 point review of systems negative.   Past Medical History:  Diagnosis Date  . Anemia    hx of years ago   . Anxiety   . Aortic valve disorders   . Arthritis   . Asthma   . Barrett esophagus   . COPD (chronic obstructive pulmonary disease) ([place])   . Depression   . Dysrhythmia    heart skips per pt   . Esophageal reflux   . Esophageal stricture   . GAD (generalized anxiety disorder)   . GERD (gastroesophageal reflux disease)   . Heart murmur   . Hiatal hernia   . History of kidney stones 01-08-13   past hx.  . Hyperlipemia   . Hypertension 12/07/2012  . Insomnia, unspecified   . Myocardial infarction [facility]) 01-08-13   '06-Chest pain-no stent-dx. MI-stress related  . Osteoporosis   . Personal history of colonic polyps 09/2010   TUBULAR  ADENOMAS (X3); NEGATIVE FOR HIGH GRADE DYSPLASIA OR MALIGNANCY.  [name] Pneumonia    hx of   . Shortness of breath    with exertion   . Stroke ([place])    hx of 2 -remains with some right sided weaknes  . Transfusion history 01-08-13   past hx. many yrs ago  . Type II or unspecified type diabetes mellitus without mention of complication, not stated as uncontrolled    on no meds   . Urinary frequency    AND INCONTINENCE    Past Surgical History:  Procedure Laterality Date  . CARDIAC CATHETERIZATION     normal per Dr Acie Fredrickson in note dated 02/06/12   . CATARACT EXTRACTION, BILATERAL    . CHOLECYSTECTOMY  2005  . ESOPHAGEAL DILATION  01-08-13   2 yrs ago  . FOOT SURGERY  2005   Left foot  . KNEE  ARTHROSCOPY  02/28/2012   Procedure: ARTHROSCOPY KNEE;  Surgeon: Tobi Bastos, MD;  Location: [facility];  Service: Orthopedics;  Laterality: Left;  . PARTIAL HYSTERECTOMY    . SHOULDER SURGERY  2011  . TONSILLECTOMY    . TOTAL KNEE ARTHROPLASTY Left 01/17/2013   Procedure: LEFT TOTAL KNEE ARTHROPLASTY;  Surgeon: Tobi Bastos, MD;  Location: [facility];  Service: Orthopedics;  Laterality: Left;  . TOTAL KNEE ARTHROPLASTY Right 06/19/2014   Procedure: RIGHT TOTAL KNEE ARTHROPLASTY;  Surgeon: Tobi Bastos, MD;  Location: [facility];  Service: Orthopedics;  Laterality: Right;  . TUBAL LIGATION    . WRIST FLEXION TENDON TENOTOMIES AND PROXIMAL CORPECTOMY W/ WRIST ARTHRODESIS&ILIAC CREST BONE GRAFT      Social History Social History   Social History  . Marital status: Single    Spouse name: N/A  . Number of children: N/A  . Years of education: N/A   Occupational History  . Retired Disabled   Social History Main Topics  . Smoking status: Current Every Day Smoker    Packs/day: 0.50    Years: 40.00    Types: Cigarettes    Last attempt to quit: 06/24/2013  . Smokeless tobacco: Former Systems developer    Types: Snuff, Chew    Quit date: 12/31/2011  . Alcohol use 0.6 oz/week    1 Cans of beer per week     Comment: occasionally  . Drug use: No  . Sexual activity: Not on file   Other Topics Concern  . Not on file   Social History Narrative  . No narrative on file     Allergies  Allergen Reactions  . Ceclor [Cefaclor] Other (See Comments)    Reaction=burning all over  . Other Anaphylaxis    BEE STINGS- CLOSE OFF THROAT  . Penicillins Anaphylaxis    "CLOSES OFF MY BREATHING"  . Pneumococcal Vaccines Other (See Comments)    PP-23 vaccine; had BIG local red reaction with heat.   . Codeine Nausea And Vomiting  . Cortisone Hives    All over body  . Boniva [Ibandronate Sodium] Other (See Comments)    Jaw popping    Family History  Problem Relation Age of Onset  . Breast cancer Mother     . Colon cancer Brother 78  . Colon polyps Brother   . Diabetes Maternal Grandfather   . Kidney disease      Both sides of family  . Heart disease      Both sides of family  . Breast cancer Maternal Aunt   . Breast cancer Maternal Grandmother  Prior to Admission medications   Medication Sig Start Date End Date Taking? Authorizing Provider  acetaminophen (TYLENOL) 325 MG tablet Take 1-2 tablets (325-650 mg total) by mouth every 4 (four) hours as needed for mild pain. 06/27/14  Yes Ivan Anchors Love, PA-C  albuterol (PROVENTIL HFA;VENTOLIN HFA) 108 (90 BASE) MCG/ACT inhaler Inhale 2 puffs into the lungs every 4 (four) hours as needed for wheezing or shortness of breath. Take two puffs four times a day as needed 05/08/15 06/28/16 Yes Clinton D Young, MD  albuterol (PROVENTIL) (2.5 MG/3ML) 0.083% nebulizer solution Take 3 mLs (2.5 mg total) by nebulization every 6 (six) hours as needed for wheezing. DX  491.9 01/08/15  Yes Deneise Lever, MD  ALPRAZolam Duanne Moron) 0.5 MG tablet Take 0.5 mg by mouth 3 (three) times daily.   Yes Historical Provider, MD  aspirin EC 81 MG tablet Take 81 mg by mouth every morning.    Yes Historical Provider, MD  atorvastatin (LIPITOR) 40 MG tablet Take 1 tablet (40 mg total) by mouth at bedtime. 07/09/13  Yes Mary-Margaret Hassell Done, FNP  azelastine (ASTELIN) 0.1 % nasal spray Place 1 spray into both nostrils 2 (two) times daily. Use in each nostril as directed   Yes Historical Provider, MD  benzonatate (TESSALON) 200 MG capsule TAKE 1 CAPSULE 3 TIMES DAILY AS NEEDED FOR COUGH 12/31/15  Yes Deneise Lever, MD  calcium carbonate (OS-CAL) 600 MG TABS Take 600 mg by mouth at bedtime.    Yes Historical Provider, MD  carvedilol (COREG) 3.125 MG tablet Take 1 tablet (3.125 mg total) by mouth 2 (two) times daily. 07/03/14  Yes Thayer Headings, MD  cholecalciferol (VITAMIN D) 1000 UNITS tablet Take 1,000 Units by mouth daily.   Yes Historical Provider, MD  dexlansoprazole (DEXILANT)  60 MG capsule Take 1 capsule (60 mg total) by mouth every morning. 12/07/12  Yes Mary-Margaret Hassell Done, FNP  EPINEPHrine 0.3 mg/0.3 mL IJ SOAJ injection Inject 0.3 mLs (0.3 mg total) into the muscle as needed. Patient taking differently: Inject 0.3 mg into the muscle as needed (ANAPHYLAXIS).  11/06/14  Yes Deneise Lever, MD  ferrous sulfate 325 (65 FE) MG tablet Take 1 tablet (325 mg total) by mouth 2 (two) times daily. 06/27/14  Yes Ivan Anchors Love, PA-C  fluticasone (FLONASE) 50 MCG/ACT nasal spray Place 2 sprays into the nose daily. 05/07/13  Yes Deneise Lever, MD  fluticasone furoate-vilanterol (BREO ELLIPTA) 200-25 MCG/INH AEPB Inhale 1 puff into the lungs daily. Patient taking differently: Inhale 1 puff into the lungs daily as needed (shortness of breath).  11/06/15  Yes Deneise Lever, MD  furosemide (LASIX) 40 MG tablet Take 40 mg by mouth daily.   Yes Historical Provider, MD  losartan (COZAAR) 50 MG tablet Take 1 tablet (50 mg total) by mouth daily. 07/03/14  Yes Thayer Headings, MD  metFORMIN (GLUCOPHAGE) 500 MG tablet Take 1 tablet (500 mg total) by mouth 2 (two) times daily. Patient taking differently: Take 500 mg by mouth every evening.  06/27/14  Yes Ivan Anchors Love, PA-C  montelukast (SINGULAIR) 10 MG tablet Take 1 tablet (10 mg total) by mouth at bedtime. 05/15/14  Yes Deneise Lever, MD  PARoxetine (PAXIL) 20 MG tablet Take 20 mg by mouth at bedtime.    Yes Historical Provider, MD  senna-docusate (SENOKOT-S) 8.6-50 MG per tablet Take 2 tablets by mouth 2 (two) times daily. Patient taking differently: Take 2 tablets by mouth 2 (two) times daily as needed  for mild constipation.  06/27/14  Yes Ivan Anchors Love, PA-C  vitamin B-12 (CYANOCOBALAMIN) 250 MCG tablet Take 250 mcg by mouth daily.   Yes Historical Provider, MD    Physical Exam:    Vitals:   02/24/16 0615 02/24/16 0630 02/24/16 0730 02/24/16 0830  BP: 103/68 121/70 117/62 119/61  Pulse: 72 64 67 82  Resp: 18 17 19 19   Temp:       TempSrc:      SpO2: 98% 95% 96% 96%  Weight:      Height:           Constitutional: NAD, calm, comfortable  Vitals:   02/24/16 0615 02/24/16 0630 02/24/16 0730 02/24/16 0830  BP: 103/68 121/70 117/62 119/61  Pulse: 72 64 67 82  Resp: 18 17 19 19   Temp:      TempSrc:      SpO2: 98% 95% 96% 96%  Weight:      Height:       Eyes: PERRL, lids and conjunctivae normal ENMT: Mucous membranes are moist. Posterior pharynx clear of any exudate or lesions.Normal dentition.  Neck: normal, supple, no masses, no thyromegaly Respiratory: clear to auscultation bilaterally, no wheezing, no crackles. Normal respiratory effort. No accessory muscle use.  Cardiovascular: Irregularly regular rate and rhythm, no murmurs / rubs / gallops. No extremity edema. 2+ pedal pulses. No carotid bruits.  Abdomen: no tenderness, no masses palpated. No hepatosplenomegaly. Bowel sounds positive.  Musculoskeletal: no clubbing / cyanosis. No joint deformity upper and lower extremities. Good ROM, no contractures. Normal muscle tone. Reproducible tenderness to palpation on the right shoulder (patient reports this is not new) Skin: no rashes, lesions, ulcers.  Neurologic:Remarkable for right pronator drift, mild decreased sensation to the right triceps area, and questionable left facial droop Psychiatric: Normal judgment and insight. Alert and oriented x 3. Normal mood.     Labs on Admission: I have personally reviewed following labs and imaging studies  CBC:  Recent Labs Lab 02/24/16 0324  WBC 7.2  HGB 12.4  HCT 38.3  MCV 92.3  PLT [id number]    Basic Metabolic Panel:  Recent Labs Lab 02/24/16 0324  NA 140  K 2.8*  CL 105  CO2 24  GLUCOSE 133*  BUN 9  CREATININE 0.70  CALCIUM 9.0  MG 1.7    GFR: Estimated Creatinine Clearance: 67.3 mL/min (by C-G formula based on SCr of 0.8 mg/dL).  Liver Function Tests: No results for input(s): AST, ALT, ALKPHOS, BILITOT, PROT, ALBUMIN in the last 168  hours. No results for input(s): LIPASE, AMYLASE in the last 168 hours. No results for input(s): AMMONIA in the last 168 hours.  Coagulation Profile: No results for input(s): INR, PROTIME in the last 168 hours.  Cardiac Enzymes: No results for input(s): CKTOTAL, CKMB, CKMBINDEX, TROPONINI in the last 168 hours.  BNP (last 3 results) No results for input(s): PROBNP in the last 8760 hours.  HbA1C: No results for input(s): HGBA1C in the last 72 hours.  CBG: No results for input(s): GLUCAP in the last 168 hours.  Lipid Profile: No results for input(s): CHOL, HDL, LDLCALC, TRIG, CHOLHDL, LDLDIRECT in the last 72 hours.  Thyroid Function Tests: No results for input(s): TSH, T4TOTAL, FREET4, T3FREE, THYROIDAB in the last 72 hours.  Anemia Panel: No results for input(s): VITAMINB12, FOLATE, FERRITIN, TIBC, IRON, RETICCTPCT in the last 72 hours.  Urine analysis:    Component Value Date/Time   COLORURINE YELLOW 06/11/2014 [place] 06/11/2014 1411  LABSPEC 1.024 06/11/2014 1411   PHURINE 5.5 06/11/2014 1411   GLUCOSEU 100 (A) 06/11/2014 1411   HGBUR NEGATIVE 06/11/2014 1411   BILIRUBINUR NEGATIVE 06/11/2014 1411   KETONESUR NEGATIVE 06/11/2014 1411   PROTEINUR NEGATIVE 06/11/2014 1411   UROBILINOGEN 0.2 06/11/2014 1411   NITRITE NEGATIVE 06/11/2014 1411   LEUKOCYTESUR NEGATIVE 06/11/2014 1411    Sepsis Labs: @LABRCNTIP (procalcitonin:4,lacticidven:4) )No results found for this or any previous visit (from the past 240 hour(s)).   Radiological Exams on Admission: Dg Chest 2 View  Result Date: 02/24/2016 CLINICAL DATA:  65 year old female with chest pain EXAM: CHEST  2 VIEW COMPARISON:  Chest radiograph dated 06/21/2014 FINDINGS: Two views of the chest do not demonstrate a focal consolidation. There is no pleural effusion or pneumothorax. The cardiac silhouette is within normal limits. There is degenerative changes of the spine and shoulders. No acute osseous  pathology. IMPRESSION: No active cardiopulmonary disease. Electronically Signed   By: Anner Crete M.D.   On: 02/24/2016 04:42  Ct Head Wo Contrast  Result Date: 02/24/2016 CLINICAL DATA:  Headache with right-sided numbness and weakness for 1 day EXAM: CT HEAD WITHOUT CONTRAST TECHNIQUE: Contiguous axial images were obtained from the base of the skull through the vertex without intravenous contrast. Note that the patient had received intravenous contrast for a different study approximately 3 hours prior. COMPARISON:  None. FINDINGS: Brain: The ventricles are normal in size and configuration. There is no intracranial mass, hemorrhage, extra-axial fluid collection, or midline shift. Gray-white compartments are normal. No acute infarct evident. Vascular: There is no appreciable hyperdense vessel. No vascular calcification is demonstrable. Skull: The bony calvarium appears intact. Sinuses/Orbits: Visualized paranasal sinuses are clear. Visualized orbits appear symmetric bilaterally. Other: Mastoid air cells are clear. IMPRESSION: Study within normal limits. Electronically Signed   By: Lowella Grip III M.D.   On: 02/24/2016 08:28  Ct Angio Chest Pe W Or Wo Contrast  Result Date: 02/24/2016 CLINICAL DATA:  65 year old female with chest pain EXAM: CT ANGIOGRAPHY CHEST WITH CONTRAST TECHNIQUE: Multidetector CT imaging of the chest was performed using the standard protocol during bolus administration of intravenous contrast. Multiplanar CT image reconstructions and MIPs were obtained to evaluate the vascular anatomy. CONTRAST:  100 cc Isovue 370 COMPARISON:  Chest radiograph dated 02/24/2016 FINDINGS: Linear left lung base atelectasis/scarring noted. Minimal bibasilar dependent atelectatic changes seen. There is no focal consolidation, pleural effusion, or pneumothorax. The central airways are patent. There is mild atherosclerotic calcification of the aorta. No CT evidence of pulmonary embolism. Top-normal  cardiac size. There is coronary vascular calcification. There is no pericardial effusion. There is no hilar or mediastinal adenopathy. Small hiatal hernia. The esophagus is grossly unremarkable. There is no axillary or supraclavicular adenopathy. The chest wall soft tissues appear unremarkable. There is mild degenerative changes of the spine. No acute fracture. Cholecystectomy. The visualized upper abdomen is otherwise unremarkable. Review of the MIP images confirms the above findings. IMPRESSION: No acute intrathoracic pathology. No CT evidence of pulmonary embolism. Electronically Signed   By: Anner Crete M.D.   On: 02/24/2016 05:58   EKG: Independently reviewed.  Assessment/Plan Active Problems:   Stroke-like symptoms   Chest pain   Hyperlipidemia   DM type 2 (diabetes mellitus, type 2) (H[facility])   TIA (transient ischemic attack)   Atrial fibrillation with RVR ([place])   History of esophageal stricture   Atrial fibrillation ([facility])   Anxiety   Strokelike symptoms/ remote CVA  2015, with exacerbation of her right arm weakness.  CT head negative. EKG Afib but  Within her  limits of normal. CT angio chest neg for PE.Her neuro deficits have not  improved in the ED . Recent 2 D echo and stress test nl LVF without any signs of clots. Tn negative. Last carotid dopplers in 2015 < 40 percent blockage bilaterally.  Admit to tele obs   For now allow permissive hypertension  MRI of the brain  MRA neck Aspirin and high-dose statin  SCDs for now until MRI results   Hemoglobin A1c Check B12 Bedside swallow eval and if passes heart healthy diet    Chest pain syndrome, cardiac versus musculoskeletal vs anxiety History of normal cath without stent in 2006.  HEART score 3-4  Troponin 0 , EKG  A fib without evidence of ACS. CP resolved in transit with  nitroglycerin and aspirin. CXR unrevealing. 2 D echo on  7/24 and stress test on 7/25  With normal LVF EF 55-60  Cycle troponins EKG in am continue  ASA, O2 and NTG as needed Continue preadmission beta blocker after permissive hypertension is discontinues GI cocktail Check Lipid panel as above Hb A1C as above  Continue pain meds and anxiolytics   Hypertension BP 103/68   Pulse 72    Controlled Continue home anti-hypertensive medications tomorrow Add Hydralazine Q6 hours as needed for BP 210/110    Hypokalemia likely due to diuretics K 2.8 Has been replenished in the ER with KCl x4 IV  Additional dose of K dur  Hold diuretics Will follow with CMET in am  Hyperlipidemia Continue home statins   Atrial Fibrillation CHA2DS2-VASc score 6. On ASA  Had an episode of RVR on transport, now controlled  -Continue home meds may need to add Cardizem if rate changes.   Type II Diabetes Current blood sugar level is 133 Lab Results  Component Value Date   HGBA1C 7.3  % 12/07/2012   Hgb A1C Hold home oral diabetic medications.   SSI Heart healthy carb modified diet.  COPD/ Chronic bronchitis without exacerbation Continue her home meds   GERD, no acute symptoms: Continue PPI   Anxiety/Depression Continue Paxil, Xanax    DVT prophylaxis: SCDs until MRI results rule out any intracranial bleeding issues. Code Status:   Full  Family Communication:  Discussed with patient  Disposition Plan: Expect patient to be discharged to home after condition improves Consults called:    None Admission status:Tele  Obs    [name] Nola E, PA-C [facility]   If 7PM-7AM, please contact night-coverage [email or web address] Password [facility]  02/24/2016, 8:51 AM

## 2016-02-24 NOTE — ED Notes (Signed)
Patient transported to CT 

## 2016-02-24 NOTE — ED Notes (Signed)
Admitting at bedside 

## 2016-02-24 NOTE — Telephone Encounter (Signed)
On Call Cardiology  [employer] (cardiac event monitoring) contacted [place] and reported patient having an episode of AF with RVR with V rates in 160's based upon auto transmission. They tried to contact the patient but were unsuccessful.   I also tried to call the patient. The cell phone listed is not accepting any calls. I left a voice message on the home phone listed in our system.    Reviewed office visit note from Dr. Acie Fredrickson dated 01/26/2016.   Wandra Mannan, MD

## 2016-02-24 NOTE — ED Notes (Signed)
Pt states she is having some new weakness on her right arm since yesterday, pt is graving her arm up to be able to hold it up. No report of EMS about this Sx.

## 2016-02-24 NOTE — ED Provider Notes (Signed)
[facility] DEPT Provider Note   CSN: [id number] Arrival date & time: 02/24/16  [id number]  First Provider Contact:  3:40 AM    By signing my name below, I, [name] [name], attest that this documentation has been prepared under the direction and in the presence of Everlene Balls, MD. Electronically Signed: Reola [name], ED Scribe. 02/24/16. 4:05 AM.  History   Chief Complaint Chief Complaint  Patient presents with  . Chest Pain  . Atrial Fibrillation  . Near Syncope   The history is provided by the patient. No language interpreter was used.   HPI Comments: [name] [name] is a 65 y.o. female BIB EMS, with a past medical history of atrial fibrillation, GAD, GERD, COPD, MI, CVA, CHF  heart murmur, HLD, HTN who presents to the Emergency Department complaining of sudden onset, unchanged, intermittent, mid-sternal chest pain onset PTA. She notes that she is having associated SOB with her episodes of pain. EMS reports that the pt was notably diaphoretic, and stated that she was dizzy and near-syncope upon pick-up. On the way to the ED EMS notes that the pt had a run of SVT for ~10 beats with a HR at 144. EMS administered 324mg  of ASA en route. She also reports that she has been experiencing right-sided arm weakness and mild right shoulder pain x 2-3 days. Pt has a hx of surgery to the right shoulder with hardware placement. Pt is currently taking Coreg and 81mg  Asprin daily. Pt states that her pain is sometimes brought on when she will stand up from sitting. She is currently followed by Dr. Cathie Olden for her cardiology work-ups. Denies any other symptoms.   Past Medical History:  Diagnosis Date  . Anemia    hx of years ago   . Anxiety   . Aortic valve disorders   . Arthritis   . Asthma   . Barrett esophagus   . COPD (chronic obstructive pulmonary disease) ([place])   . Depression   . Dysrhythmia    heart skips per pt   . Esophageal reflux   . Esophageal stricture   . GAD  (generalized anxiety disorder)   . GERD (gastroesophageal reflux disease)   . Heart murmur   . Hiatal hernia   . History of kidney stones 01-08-13   past hx.  . Hyperlipemia   . Hypertension 12/07/2012  . Insomnia, unspecified   . Myocardial infarction [facility]) 01-08-13   '06-Chest pain-no stent-dx. MI-stress related  . Osteoporosis   . Personal history of colonic polyps 09/2010   TUBULAR ADENOMAS (X3); NEGATIVE FOR HIGH GRADE DYSPLASIA OR MALIGNANCY.  [name] Pneumonia    hx of   . Shortness of breath    with exertion   . Stroke ([place])    hx of 2 -remains with some right sided weaknes  . Transfusion history 01-08-13   past hx. many yrs ago  . Type II or unspecified type diabetes mellitus without mention of complication, not stated as uncontrolled    on no meds   . Urinary frequency    AND INCONTINENCE    Patient Active Problem List   Diagnosis Date Noted  . Weakness 01/26/2016  . Atrial fibrillation ([place]) 01/26/2016  . History of colon polyps 01/28/2015  . Dysphagia 01/28/2015  . History of esophageal stricture 01/28/2015  . Primary osteoarthritis of right knee 06/24/2014  . Acute systolic heart failure ([place]) 06/23/2014  . Takotsubo syndrome 06/21/2014  . Elevated troponin 06/20/2014  . Atrial fibrillation with RVR ([place])  06/20/2014  . Congestive dilated cardiomyopathy ([place]) 06/20/2014  . Demand ischemia of myocardium ([place]) 06/20/2014  . 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 Respiratory failure requiring intubation ([place]) 06/19/2014  . Acute pulmonary edema ([place]) 06/19/2014  . Acute respiratory failure with hypoxia ([place]) 06/19/2014  . Hypoxemia   . Arterial hypotension   . Gait instability 10/26/2013  . Stroke ([place]) 10/26/2013  . TIA (transient ischemic attack) 10/26/2013  . Tobacco user, hx of 05/20/2013  . Postoperative anemia due to acute blood loss 01/18/2013  . Osteoarthritis of left knee 01/17/2013  . Hypertension 12/07/2012  . Peripheral edema 12/07/2012    . DM type 2 (diabetes mellitus, type 2) ([place]) 12/07/2012  . Chronic bronchitis with acute exacerbation ([place]) 08/05/2012  . Seasonal and perennial allergic rhinitis 08/05/2012  . Urticaria 08/05/2012  . Chest pain 02/06/2012  . Hyperlipidemia 02/06/2012    Past Surgical History:  Procedure Laterality Date  . CARDIAC CATHETERIZATION     normal per Dr Acie Fredrickson in note dated 02/06/12   . CATARACT EXTRACTION, BILATERAL    . CHOLECYSTECTOMY  2005  . ESOPHAGEAL DILATION  01-08-13   2 yrs ago  . FOOT SURGERY  2005   Left foot  . KNEE ARTHROSCOPY  02/28/2012   Procedure: ARTHROSCOPY KNEE;  Surgeon: Tobi Bastos, MD;  Location: [facility] ORS;  Service: Orthopedics;  Laterality: Left;  . PARTIAL HYSTERECTOMY    . SHOULDER SURGERY  2011  . TONSILLECTOMY    . TOTAL KNEE ARTHROPLASTY Left 01/17/2013   Procedure: LEFT TOTAL KNEE ARTHROPLASTY;  Surgeon: Tobi Bastos, MD;  Location: [facility] ORS;  Service: Orthopedics;  Laterality: Left;  . TOTAL KNEE ARTHROPLASTY Right 06/19/2014   Procedure: RIGHT TOTAL KNEE ARTHROPLASTY;  Surgeon: Tobi Bastos, MD;  Location: [facility] ORS;  Service: Orthopedics;  Laterality: Right;  . TUBAL LIGATION    . WRIST FLEXION TENDON TENOTOMIES AND PROXIMAL CORPECTOMY W/ WRIST ARTHRODESIS&ILIAC CREST BONE GRAFT     OB History    No data available     Home Medications    Prior to Admission medications   Medication Sig Start Date End Date Taking? Authorizing Provider  acetaminophen (TYLENOL) 325 MG tablet Take 1-2 tablets (325-650 mg total) by mouth every 4 (four) hours as needed for mild pain. 06/27/14   Bary Leriche, PA-C  albuterol (PROVENTIL HFA;VENTOLIN HFA) 108 (90 BASE) MCG/ACT inhaler Inhale 2 puffs into the lungs every 4 (four) hours as needed for wheezing or shortness of breath. Take two puffs four times a day as needed 05/08/15 06/28/16  Deneise Lever, MD  albuterol (PROVENTIL) (2.5 MG/3ML) 0.083% nebulizer solution Take 3 mLs (2.5 mg total) by nebulization every 6  (six) hours as needed for wheezing. DX  491.9 01/08/15   Deneise Lever, MD  ALPRAZolam Duanne Moron) 0.5 MG tablet Take 0.5 mg by mouth 3 (three) times daily.    Historical Provider, MD  aspirin EC 81 MG tablet Take 81 mg by mouth every morning.     Historical Provider, MD  atorvastatin (LIPITOR) 40 MG tablet Take 1 tablet (40 mg total) by mouth at bedtime. 07/09/13   Mary-Margaret Hassell Done, FNP  azelastine (ASTELIN) 0.1 % nasal spray Place 1 spray into both nostrils 2 (two) times daily. Use in each nostril as directed    Historical Provider, MD  benzonatate (TESSALON) 200 MG capsule TAKE 1 CAPSULE 3 TIMES DAILY AS NEEDED FOR COUGH 12/31/15   Deneise Lever, MD  calcium carbonate (OS-CAL)  600 MG TABS Take 600 mg by mouth at bedtime.     Historical Provider, MD  carvedilol (COREG) 3.125 MG tablet Take 1 tablet (3.125 mg total) by mouth 2 (two) times daily. 07/03/14   Thayer Headings, MD  cholecalciferol (VITAMIN D) 1000 UNITS tablet Take 1,000 Units by mouth daily.    Historical Provider, MD  dexlansoprazole (DEXILANT) 60 MG capsule Take 1 capsule (60 mg total) by mouth every morning. 12/07/12   Mary-Margaret Hassell Done, FNP  EPINEPHrine 0.3 mg/0.3 mL IJ SOAJ injection Inject 0.3 mLs (0.3 mg total) into the muscle as needed. Patient taking differently: Inject 0.3 mg into the muscle as needed (ANAPHYLAXIS).  11/06/14   Deneise Lever, MD  ferrous sulfate 325 (65 FE) MG tablet Take 1 tablet (325 mg total) by mouth 2 (two) times daily. 06/27/14   Ivan Anchors Love, PA-C  fluticasone (FLONASE) 50 MCG/ACT nasal spray Place 2 sprays into the nose daily. 05/07/13   Deneise Lever, MD  fluticasone furoate-vilanterol (BREO ELLIPTA) 200-25 MCG/INH AEPB Inhale 1 puff into the lungs daily. 11/06/15   Deneise Lever, MD  furosemide (LASIX) 40 MG tablet Take 40 mg by mouth daily.    Historical Provider, MD  losartan (COZAAR) 50 MG tablet Take 1 tablet (50 mg total) by mouth daily. 07/03/14   Thayer Headings, MD  Menthol (HONEY LEMON  COUGH DROPS MT) Use as directed in the mouth or throat. Taking approx. 8-9 daily    Historical Provider, MD  metFORMIN (GLUCOPHAGE) 500 MG tablet Take 1 tablet (500 mg total) by mouth 2 (two) times daily. Patient taking differently: Take 500 mg by mouth daily with breakfast.  06/27/14   Ivan Anchors Love, PA-C  montelukast (SINGULAIR) 10 MG tablet Take 1 tablet (10 mg total) by mouth at bedtime. 05/15/14   Deneise Lever, MD  PARoxetine (PAXIL) 20 MG tablet Take 20 mg by mouth at bedtime.     Historical Provider, MD  senna-docusate (SENOKOT-S) 8.6-50 MG per tablet Take 2 tablets by mouth 2 (two) times daily. 06/27/14   Bary Leriche, PA-C  vitamin B-12 (CYANOCOBALAMIN) 250 MCG tablet Take 250 mcg by mouth daily.    Historical Provider, MD    Family History Family History  Problem Relation Age of Onset  . Breast cancer Mother   . Colon cancer Brother 67  . Colon polyps Brother   . Diabetes Maternal Grandfather   . Kidney disease      Both sides of family  . Heart disease      Both sides of family  . Breast cancer Maternal Aunt   . Breast cancer Maternal Grandmother     Social History Social History  Substance Use Topics  . Smoking status: Current Every Day Smoker    Packs/day: 0.50    Years: 40.00    Types: Cigarettes    Last attempt to quit: 06/24/2013  . Smokeless tobacco: Former Systems developer    Types: Snuff, Chew    Quit date: 12/31/2011  . Alcohol use 0.6 oz/week    1 Cans of beer per week     Comment: occasionally     Allergies   Ceclor [cefaclor]; Other; Penicillins; Pneumococcal vaccines; Codeine; Cortisone; and Boniva [ibandronate sodium]   Review of Systems Review of Systems   Physical Exam Updated Vital Signs BP 118/73 (BP Location: Right Arm)   Pulse 87   Temp 97.4 F (36.3 C) (Oral)   Resp 21   Ht 5' 0.5" (1.537  m)   Wt 172 lb (78 kg)   SpO2 99%   BMI 33.04 kg/m   Physical Exam  Constitutional: She is oriented to person, place, and time. She appears  well-developed and well-nourished. No distress.  HENT:  Head: Normocephalic and atraumatic.  Nose: Nose normal.  Mouth/Throat: Oropharynx is clear and moist. No oropharyngeal exudate.  Eyes: Conjunctivae and EOM are normal. Pupils are equal, round, and reactive to light. No scleral icterus.  Neck: Normal range of motion. Neck supple. No JVD present. No tracheal deviation present. No thyromegaly present.  Cardiovascular: Normal rate and normal heart sounds.  An irregularly irregular rhythm present. Exam reveals no gallop and no friction rub.   No murmur heard. Pulmonary/Chest: Effort normal and breath sounds normal. No respiratory distress. She has no wheezes. She exhibits no tenderness.  Abdominal: Soft. Bowel sounds are normal. She exhibits no distension and no mass. There is no tenderness. There is no rebound and no guarding.  Musculoskeletal: Normal range of motion. She exhibits no edema or tenderness.  Lymphadenopathy:    She has no cervical adenopathy.  Neurological: She is alert and oriented to person, place, and time.  Pt exhibits right pronator drift on exam.  Skin: Skin is warm and dry. No rash noted. No erythema. No pallor.  Nursing note and vitals reviewed.  ED Treatments / Results  DIAGNOSTIC STUDIES: Oxygen Saturation is 99% on RA, normal by my interpretation.   COORDINATION OF CARE: 4:05 AM-Discussed next steps with pt. Pt verbalized understanding and is agreeable with the plan.   Labs (all labs ordered are listed, but only abnormal results are displayed) Labs Reviewed  BASIC METABOLIC PANEL - Abnormal; Notable for the following:       Result Value   Potassium 2.8 (*)    Glucose, Bld 133 (*)    All other components within normal limits  CBC  BRAIN NATRIURETIC PEPTIDE  MAGNESIUM  I-STAT TROPOININ, ED    EKG  EKG Interpretation None      Radiology Dg Chest 2 View  Result Date: 02/24/2016 CLINICAL DATA:  65 year old female with chest pain EXAM: CHEST  2  VIEW COMPARISON:  Chest radiograph dated 06/21/2014 FINDINGS: Two views of the chest do not demonstrate a focal consolidation. There is no pleural effusion or pneumothorax. The cardiac silhouette is within normal limits. There is degenerative changes of the spine and shoulders. No acute osseous pathology. IMPRESSION: No active cardiopulmonary disease. Electronically Signed   By: Anner Crete M.D.   On: 02/24/2016 04:42  Ct Angio Chest Pe W Or Wo Contrast  Result Date: 02/24/2016 CLINICAL DATA:  65 year old female with chest pain EXAM: CT ANGIOGRAPHY CHEST WITH CONTRAST TECHNIQUE: Multidetector CT imaging of the chest was performed using the standard protocol during bolus administration of intravenous contrast. Multiplanar CT image reconstructions and MIPs were obtained to evaluate the vascular anatomy. CONTRAST:  100 cc Isovue 370 COMPARISON:  Chest radiograph dated 02/24/2016 FINDINGS: Linear left lung base atelectasis/scarring noted. Minimal bibasilar dependent atelectatic changes seen. There is no focal consolidation, pleural effusion, or pneumothorax. The central airways are patent. There is mild atherosclerotic calcification of the aorta. No CT evidence of pulmonary embolism. Top-normal cardiac size. There is coronary vascular calcification. There is no pericardial effusion. There is no hilar or mediastinal adenopathy. Small hiatal hernia. The esophagus is grossly unremarkable. There is no axillary or supraclavicular adenopathy. The chest wall soft tissues appear unremarkable. There is mild degenerative changes of the spine. No acute fracture. Cholecystectomy. The  visualized upper abdomen is otherwise unremarkable. Review of the MIP images confirms the above findings. IMPRESSION: No acute intrathoracic pathology. No CT evidence of pulmonary embolism. Electronically Signed   By: Anner Crete M.D.   On: 02/24/2016 05:58   Procedures Procedures   Medications Ordered in ED Medications - No data  to display  Initial Impression / Assessment and Plan / ED Course  I have reviewed the triage vital signs and the nursing notes.  Pertinent labs & imaging results that were available during my care of the patient were reviewed by me and considered in my medical decision making (see chart for details).  Clinical Course    Patient presents emergency department for chest pain shortness of breath. She has history of atrial fibrillation atrial flutter if clinical evaluation. Will obtain CT scan for evaluation. Potassium was replaced.   CT scan negative for pulmonary embolism. Patient physical exam also concerning for new stroke. What the patient's chest pain workup and stroke evaluation. She is at a window as his symptoms began yesterday morning. Hospitalist was paged.   Final Clinical Impressions(s) / ED Diagnoses   Final diagnoses:  None    New Prescriptions New Prescriptions   No medications on file      I personally performed the services described in this documentation, which was scribed in my presence. The recorded information has been reviewed and is accurate.      Everlene Balls, MD 02/24/16 [phone]

## 2016-02-25 DIAGNOSIS — R079 Chest pain, unspecified: Secondary | ICD-10-CM

## 2016-02-25 DIAGNOSIS — E785 Hyperlipidemia, unspecified: Secondary | ICD-10-CM

## 2016-02-25 DIAGNOSIS — I4891 Unspecified atrial fibrillation: Secondary | ICD-10-CM | POA: Diagnosis not present

## 2016-02-25 DIAGNOSIS — G459 Transient cerebral ischemic attack, unspecified: Secondary | ICD-10-CM

## 2016-02-25 DIAGNOSIS — I209 Angina pectoris, unspecified: Secondary | ICD-10-CM | POA: Diagnosis not present

## 2016-02-25 DIAGNOSIS — R2990 Unspecified symptoms and signs involving the nervous system: Secondary | ICD-10-CM | POA: Diagnosis not present

## 2016-02-25 DIAGNOSIS — I480 Paroxysmal atrial fibrillation: Secondary | ICD-10-CM

## 2016-02-25 DIAGNOSIS — E118 Type 2 diabetes mellitus with unspecified complications: Secondary | ICD-10-CM

## 2016-02-25 DIAGNOSIS — F419 Anxiety disorder, unspecified: Secondary | ICD-10-CM

## 2016-02-25 DIAGNOSIS — E876 Hypokalemia: Secondary | ICD-10-CM | POA: Diagnosis present

## 2016-02-25 LAB — GLUCOSE, CAPILLARY
GLUCOSE-CAPILLARY: 269 mg/dL — AB (ref 65–99)
Glucose-Capillary: 150 mg/dL — ABNORMAL HIGH (ref 65–99)
Glucose-Capillary: 124 mg/dL — ABNORMAL HIGH (ref 65–99)

## 2016-02-25 LAB — BASIC METABOLIC PANEL
ANION GAP: 7 (ref 5–15)
BUN: 9 mg/dL (ref 6–20)
CO2: 25 mmol/L (ref 22–32)
Calcium: 9.1 mg/dL (ref 8.9–10.3)
Chloride: 106 mmol/L (ref 101–111)
Creatinine, Ser: 0.68 mg/dL (ref 0.44–1.00)
GLUCOSE: 229 mg/dL — AB (ref 65–99)
POTASSIUM: 4.6 mmol/L (ref 3.5–5.1)
Sodium: 138 mmol/L (ref 135–145)

## 2016-02-25 LAB — LIPID PANEL
CHOL/HDL RATIO: 5.1 ratio
CHOLESTEROL: 127 mg/dL (ref 0–200)
HDL: 25 mg/dL — AB (ref >40)
LDL Cholesterol: 67 mg/dL (ref 0–99)
Triglycerides: 173 mg/dL — ABNORMAL HIGH (ref <150)
VLDL: 35 mg/dL (ref 0–40)

## 2016-02-25 LAB — HEMOGLOBIN A1C
HEMOGLOBIN A1C: 7.4 % — AB (ref 4.8–5.6)
MEAN PLASMA GLUCOSE: 166 mg/dL

## 2016-02-25 LAB — TSH: TSH: 3.919 u[IU]/mL (ref 0.350–4.500)

## 2016-02-25 LAB — MAGNESIUM: Magnesium: 2.2 mg/dL (ref 1.7–2.4)

## 2016-02-25 MED ORDER — ATORVASTATIN CALCIUM 80 MG PO TABS: 80.0000 mg | ORAL_TABLET | Freq: Every day | ORAL | Status: DC

## 2016-02-25 MED ORDER — METFORMIN HCL 500 MG PO TABS: 500.0000 mg | ORAL_TABLET | Freq: Every evening | ORAL | Status: DC

## 2016-02-25 MED ORDER — CARVEDILOL 6.25 MG PO TABS: 3.1250 mg | ORAL_TABLET | Freq: Two times a day (BID) | ORAL | 1 refills | Status: DC

## 2016-02-25 MED ORDER — APIXABAN 5 MG PO TABS: 5.0000 mg | ORAL_TABLET | Freq: Two times a day (BID) | ORAL | 1 refills | Status: DC

## 2016-02-25 MED ORDER — APIXABAN 5 MG PO TABS
5.0000 mg | ORAL_TABLET | Freq: Two times a day (BID) | ORAL | Status: DC
  Administered 2016-02-25 (×2): 5 mg via ORAL
  Filled 2016-02-25 (×2): qty 1

## 2016-02-25 MED ORDER — CARVEDILOL 6.25 MG PO TABS
6.2500 mg | ORAL_TABLET | Freq: Two times a day (BID) | ORAL | Status: DC
  Administered 2016-02-25: 6.25 mg via ORAL
  Filled 2016-02-25: qty 1

## 2016-02-25 MED ORDER — ATORVASTATIN CALCIUM 40 MG PO TABS
40.0000 mg | ORAL_TABLET | Freq: Every day | ORAL | Status: DC
  Administered 2016-02-25: 40 mg via ORAL
  Filled 2016-02-25: qty 1

## 2016-02-25 NOTE — Progress Notes (Signed)
Discharge instructions reviewed and all questions answered. Patient sent home with a copy of her D/C instructions and her personal belongings. IV's x 2 removed with catheter tip intact and dressing placed and it remains C,D,I. All pm meds given prior to D/C and patient leaving in stable condition

## 2016-02-25 NOTE — Plan of Care (Signed)
Problem: Skin Integrity: Goal: Risk for impaired skin integrity will decrease Outcome: Completed/Met Date Met: 02/25/16 D/C'd home with skin intact, VSS and written instructions, all questions answered and patient verbalized understanding.

## 2016-02-25 NOTE — Plan of Care (Signed)
Problem: Activity: Goal: Risk for activity intolerance will decrease Outcome: Completed/Met Date Met: 02/25/16 Pt D/C'd in stable condition, all D/C instructions reviewed and patient verbalized understanding, written instructions sent with when to call MD/EMS

## 2016-02-25 NOTE — Discharge Summary (Signed)
Physician Discharge Summary  [name] [name] [id number] DOB: May 17, 1951 DOA: 02/24/2016  PCP: Velna Hatchet, MD  Admit date: 02/24/2016 Discharge date: 02/25/2016  Admitted From: Home Discharge disposition: Home    Recommendations for Outpatient Follow-Up:   1. The patient will follow up with Dr. Acie Fredrickson.   Discharge Diagnosis:   Principal Problem:    Chest pain Active Problems:    Hyperlipidemia    Diabetes mellitus with complication (HCC)    TIA (transient ischemic attack)    Atrial fibrillation with RVR (HCC)    History of esophageal stricture    Atrial fibrillation (HCC)    Anxiety    Stroke-like symptoms    Hypokalemia    Discharge Condition: Improved.  Diet recommendation: Low sodium, heart healthy.  Carbohydrate-modified.   History of Present Illness:   [name] [name] is an 65 y.o. female with a Past Medical History of anemia, anxiety, asthma, Barrett's esophagitis, COPD, depression, HLD, HTN, MI, Diabetes, CVA with residual right arm weakness who Was admitted 02/24/16 with a chief complaint of chest pain and findings concerning for possible recurrent stroke affecting the right upper extremity. Workup in the ED included a CT of the head which was negative, a CT angiogram of the chest which was negative for pulmonary embolism. EKG showed atrial fibrillation. Troponin not elevated. BNP 48.6.   Hospital Course by Problem:   Principal Problems:   Chest pain, likely related to transient ischemia in the setting of tachycardia Possibly related to demand ischemia in the setting of tachycardia. Patient was noted to have a run of SVT with a heart rate in the 140s by EMS. She was given 324 mg of aspirin which resolved her pain. 12-lead EKG showed atrial fibrillation. Last 2-D echo performed on 02/22/2016, with normal LV function and diastolic function, ejection fraction 55-60%. Last stress test performed on 02/23/2015, normal. Troponins negative 3.  Recently had an outpatient cardiac monitor.I suspect her symptoms are being triggered by tachyarrhythmias. TSH WNL. Evaluated by cardiology, Coreg increased.  Will F/U with Dr. Acie Fredrickson.    Strokelike symptoms/possible TIA Patient reported dizziness, right-sided arm weakness, and word finding difficulty on initial evaluation. CT of the head was negative. Stroke workup initiated. MRI of the brain normal. Normal intracranial MR angiography. Normal MR angiography of the neck with the exception of some tortuosity of the internal carotid arteries. Continue aggressive risk reduction.  Active Problems:   Hypertension Continue Lasix, Coreg and Cozaar.    COPD Continue Breo and albuterol as needed.    Hyperlipidemia Continue Lipitor. Cholesterol 127, LDL 67.    Diabetes mellitus with complication ([place]) Managed with metformin at home, which is currently on hold. Currently being managed with insulin sensitive SSI 3 times a day. Hemoglobin A1c 7.4%. CBGs 150-250. Resume Metformin at D/C.  Will need close F/U with PCP.    Atrial fibrillation with RVR (HCC) CHA2DS2-VASc score 7. Heart rate currently controlled on Coreg, but suspect she is having PAF with high heart rates as noted above.  Needs anti-coagulation, and cardiology has recommended NOAC. Eliquis started. F/U with Dr. Acie Fredrickson. Will need outpatient EP follow up.    History of esophageal stricture/GERD Continue PPI therapy.    Anxiety Continue Paxil and Xanax as needed.    Hypokalemia Likely secondary to diuretic therapy. Supplemented.    Medical Consultants:    Cardiology   Discharge Exam:   Vitals:   02/25/16 1100 02/25/16 1718  BP:  132/75  Pulse:  68  Resp:  16  Temp: 98.7 F (37.1 C) 98.5 F (36.9 C)   Vitals:   02/25/16 0944 02/25/16 0948 02/25/16 1100 02/25/16 1718  BP: 128/82   132/75  Pulse:    68  Resp:    16  Temp: 97.5 F (36.4 C)  98.7 F (37.1 C) 98.5 F (36.9 C)  TempSrc: Oral  Oral Oral  SpO2:  97% 97%    Weight:      Height:        General exam: Appears calm and comfortable.  Respiratory system: Clear to auscultation. Respiratory effort normal. Cardiovascular system: S1 & S2 heard, RRR. No JVD,  rubs, gallops or clicks. No murmurs. Gastrointestinal system: Abdomen is nondistended, soft and nontender. No organomegaly or masses felt. Normal bowel sounds heard. Central nervous system: Alert and oriented. No focal neurological deficits. Extremities: No clubbing,  or cyanosis. No edema. Skin: No rashes, lesions or ulcers. Psychiatry: Judgement and insight appear normal. Mood & affect appropriate.    The results of significant diagnostics from this hospitalization (including imaging, microbiology, ancillary and laboratory) are listed below for reference.     Procedures and Diagnostic Studies:   Dg Chest 2 View  Result Date: 02/24/2016 CLINICAL DATA:  65 year old female with chest pain EXAM: CHEST  2 VIEW COMPARISON:  Chest radiograph dated 06/21/2014 FINDINGS: Two views of the chest do not demonstrate a focal consolidation. There is no pleural effusion or pneumothorax. The cardiac silhouette is within normal limits. There is degenerative changes of the spine and shoulders. No acute osseous pathology. IMPRESSION: No active cardiopulmonary disease. Electronically Signed   By: Anner Crete M.D.   On: 02/24/2016 04:42  Ct Head Wo Contrast  Result Date: 02/24/2016 CLINICAL DATA:  Headache with right-sided numbness and weakness for 1 day EXAM: CT HEAD WITHOUT CONTRAST TECHNIQUE: Contiguous axial images were obtained from the base of the skull through the vertex without intravenous contrast. Note that the patient had received intravenous contrast for a different study approximately 3 hours prior. COMPARISON:  None. FINDINGS: Brain: The ventricles are normal in size and configuration. There is no intracranial mass, hemorrhage, extra-axial fluid collection, or midline shift. Gray-white  compartments are normal. No acute infarct evident. Vascular: There is no appreciable hyperdense vessel. No vascular calcification is demonstrable. Skull: The bony calvarium appears intact. Sinuses/Orbits: Visualized paranasal sinuses are clear. Visualized orbits appear symmetric bilaterally. Other: Mastoid air cells are clear. IMPRESSION: Study within normal limits. Electronically Signed   By: Lowella Grip III M.D.   On: 02/24/2016 08:28  Ct Angio Chest Pe W Or Wo Contrast  Result Date: 02/24/2016 CLINICAL DATA:  65 year old female with chest pain EXAM: CT ANGIOGRAPHY CHEST WITH CONTRAST TECHNIQUE: Multidetector CT imaging of the chest was performed using the standard protocol during bolus administration of intravenous contrast. Multiplanar CT image reconstructions and MIPs were obtained to evaluate the vascular anatomy. CONTRAST:  100 cc Isovue 370 COMPARISON:  Chest radiograph dated 02/24/2016 FINDINGS: Linear left lung base atelectasis/scarring noted. Minimal bibasilar dependent atelectatic changes seen. There is no focal consolidation, pleural effusion, or pneumothorax. The central airways are patent. There is mild atherosclerotic calcification of the aorta. No CT evidence of pulmonary embolism. Top-normal cardiac size. There is coronary vascular calcification. There is no pericardial effusion. There is no hilar or mediastinal adenopathy. Small hiatal hernia. The esophagus is grossly unremarkable. There is no axillary or supraclavicular adenopathy. The chest wall soft tissues appear unremarkable. There is mild degenerative changes of the spine. No acute fracture. Cholecystectomy. The visualized upper  abdomen is otherwise unremarkable. Review of the MIP images confirms the above findings. IMPRESSION: No acute intrathoracic pathology. No CT evidence of pulmonary embolism. Electronically Signed   By: Anner Crete M.D.   On: 02/24/2016 05:58  Mr Jodene Nam Head Wo Contrast  Result Date: 02/24/2016 CLINICAL  DATA:  Previous stroke with residual right-sided weakness. Acute onset of occipital headache an worsened right-sided weakness this morning. EXAM: MRI HEAD WITHOUT AND WITH CONTRAST MRA HEAD WITHOUT CONTRAST MRA NECK WITHOUT AND WITH CONTRAST TECHNIQUE: Multiplanar, multiecho pulse sequences of the brain and surrounding structures were obtained without and with intravenous contrast. Angiographic images of the Circle of Willis were obtained using MRA technique without intravenous contrast. Angiographic images of the neck were obtained using MRA technique without and with intravenous contrast. Carotid stenosis measurements (when applicable) are obtained utilizing NASCET criteria, using the distal internal carotid diameter as the denominator. CONTRAST:  26mL MULTIHANCE GADOBENATE DIMEGLUMINE 529 MG/ML IV SOLN COMPARISON:  Head CT same day.  MRI 10/26/2013. FINDINGS: MRI HEAD FINDINGS The brain again has normal appearance without evidence of malformation, atrophy, old or acute infarction. Mass lesion, hemorrhage, hydrocephalus or extra-axial collection. After contrast administration, no abnormal enhancement occurs. No pituitary mass. No inflammatory sinus disease. No skull or skullbase lesion. MRA HEAD FINDINGS Both internal carotid arteries are widely patent into the brain. No siphon stenosis. The anterior and middle cerebral vessels are patent without proximal stenosis, aneurysm or vascular malformation. Posterior cerebral arteries take fetal origin from the anterior circulation.Both vertebral arteries are widely patent to the basilar. Proximal basilar fenestration, not significant. No basilar stenosis. Posterior circulation branch vessels appear normal. MRA NECK FINDINGS Branching pattern of the brachiocephalic vessels from the arch is normal. No origin stenosis. Both common carotid arteries are widely patent to their respective bifurcation. Both carotid bifurcations are widely patent. Cervical internal carotid  arteries are tortuous but widely patent. Both vertebral arteries are patent. The origins are not well seen because of motion artifact in the chest. No abnormality seen in the cervical region. Both vessels are patent to the basilar. IMPRESSION: Normal MRI of the brain. Normal intracranial MR angiography. Normal MR angiography of the neck vessels with the exception of some tortuosity of the internal carotid arteries which can be seen with a history of hypertension. Note that the vertebral artery origins are not well seen because of breathing motion in the chest, but I have no reason to suspect that they are diseased. Electronically Signed   By: Nelson Chimes M.D.   On: 02/24/2016 11:22  Mr Angiogram Neck W Wo Contrast  Result Date: 02/24/2016 CLINICAL DATA:  Previous stroke with residual right-sided weakness. Acute onset of occipital headache an worsened right-sided weakness this morning. EXAM: MRI HEAD WITHOUT AND WITH CONTRAST MRA HEAD WITHOUT CONTRAST MRA NECK WITHOUT AND WITH CONTRAST TECHNIQUE: Multiplanar, multiecho pulse sequences of the brain and surrounding structures were obtained without and with intravenous contrast. Angiographic images of the Circle of Willis were obtained using MRA technique without intravenous contrast. Angiographic images of the neck were obtained using MRA technique without and with intravenous contrast. Carotid stenosis measurements (when applicable) are obtained utilizing NASCET criteria, using the distal internal carotid diameter as the denominator. CONTRAST:  29mL MULTIHANCE GADOBENATE DIMEGLUMINE 529 MG/ML IV SOLN COMPARISON:  Head CT same day.  MRI 10/26/2013. FINDINGS: MRI HEAD FINDINGS The brain again has normal appearance without evidence of malformation, atrophy, old or acute infarction. Mass lesion, hemorrhage, hydrocephalus or extra-axial collection. After contrast administration, no abnormal  enhancement occurs. No pituitary mass. No inflammatory sinus disease. No skull  or skullbase lesion. MRA HEAD FINDINGS Both internal carotid arteries are widely patent into the brain. No siphon stenosis. The anterior and middle cerebral vessels are patent without proximal stenosis, aneurysm or vascular malformation. Posterior cerebral arteries take fetal origin from the anterior circulation.Both vertebral arteries are widely patent to the basilar. Proximal basilar fenestration, not significant. No basilar stenosis. Posterior circulation branch vessels appear normal. MRA NECK FINDINGS Branching pattern of the brachiocephalic vessels from the arch is normal. No origin stenosis. Both common carotid arteries are widely patent to their respective bifurcation. Both carotid bifurcations are widely patent. Cervical internal carotid arteries are tortuous but widely patent. Both vertebral arteries are patent. The origins are not well seen because of motion artifact in the chest. No abnormality seen in the cervical region. Both vessels are patent to the basilar. IMPRESSION: Normal MRI of the brain. Normal intracranial MR angiography. Normal MR angiography of the neck vessels with the exception of some tortuosity of the internal carotid arteries which can be seen with a history of hypertension. Note that the vertebral artery origins are not well seen because of breathing motion in the chest, but I have no reason to suspect that they are diseased. Electronically Signed   By: Nelson Chimes M.D.   On: 02/24/2016 11:22  Mr Jeri Cos [id number] Contrast  Result Date: 02/24/2016 CLINICAL DATA:  Previous stroke with residual right-sided weakness. Acute onset of occipital headache an worsened right-sided weakness this morning. EXAM: MRI HEAD WITHOUT AND WITH CONTRAST MRA HEAD WITHOUT CONTRAST MRA NECK WITHOUT AND WITH CONTRAST TECHNIQUE: Multiplanar, multiecho pulse sequences of the brain and surrounding structures were obtained without and with intravenous contrast. Angiographic images of the Circle of Willis were  obtained using MRA technique without intravenous contrast. Angiographic images of the neck were obtained using MRA technique without and with intravenous contrast. Carotid stenosis measurements (when applicable) are obtained utilizing NASCET criteria, using the distal internal carotid diameter as the denominator. CONTRAST:  93mL MULTIHANCE GADOBENATE DIMEGLUMINE 529 MG/ML IV SOLN COMPARISON:  Head CT same day.  MRI 10/26/2013. FINDINGS: MRI HEAD FINDINGS The brain again has normal appearance without evidence of malformation, atrophy, old or acute infarction. Mass lesion, hemorrhage, hydrocephalus or extra-axial collection. After contrast administration, no abnormal enhancement occurs. No pituitary mass. No inflammatory sinus disease. No skull or skullbase lesion. MRA HEAD FINDINGS Both internal carotid arteries are widely patent into the brain. No siphon stenosis. The anterior and middle cerebral vessels are patent without proximal stenosis, aneurysm or vascular malformation. Posterior cerebral arteries take fetal origin from the anterior circulation.Both vertebral arteries are widely patent to the basilar. Proximal basilar fenestration, not significant. No basilar stenosis. Posterior circulation branch vessels appear normal. MRA NECK FINDINGS Branching pattern of the brachiocephalic vessels from the arch is normal. No origin stenosis. Both common carotid arteries are widely patent to their respective bifurcation. Both carotid bifurcations are widely patent. Cervical internal carotid arteries are tortuous but widely patent. Both vertebral arteries are patent. The origins are not well seen because of motion artifact in the chest. No abnormality seen in the cervical region. Both vessels are patent to the basilar. IMPRESSION: Normal MRI of the brain. Normal intracranial MR angiography. Normal MR angiography of the neck vessels with the exception of some tortuosity of the internal carotid arteries which can be seen with  a history of hypertension. Note that the vertebral artery origins are not well seen because of breathing  motion in the chest, but I have no reason to suspect that they are diseased. Electronically Signed   By: Nelson Chimes M.D.   On: 02/24/2016 11:22    Labs:   Basic Metabolic Panel:  Recent Labs Lab 02/24/16 0324 02/25/16 0943  NA 140 138  K 2.8* 4.6  CL 105 106  CO2 24 25  GLUCOSE 133* 229*  BUN 9 9  CREATININE 0.70 0.68  CALCIUM 9.0 9.1  MG 1.7 2.2   GFR Estimated Creatinine Clearance: 67.9 mL/min (by C-G formula based on SCr of 0.8 mg/dL). Liver Function Tests: No results for input(s): AST, ALT, ALKPHOS, BILITOT, PROT, ALBUMIN in the last 168 hours. No results for input(s): LIPASE, AMYLASE in the last 168 hours. No results for input(s): AMMONIA in the last 168 hours. Coagulation profile  Recent Labs Lab 02/24/16 0847  INR 1.10    CBC:  Recent Labs Lab 02/24/16 0324  WBC 7.2  HGB 12.4  HCT 38.3  MCV 92.3  PLT 220   Cardiac Enzymes:  Recent Labs Lab 02/24/16 0847 02/24/16 1300 02/24/16 1503  TROPONINI <0.03 <0.03 <0.03   BNP: Invalid input(s): POCBNP CBG:  Recent Labs Lab 02/24/16 1625 02/24/16 2011 02/25/16 0750 02/25/16 1154 02/25/16 1704  GLUCAP 218* 198* 150* 269* 124*   D-Dimer No results for input(s): DDIMER in the last 72 hours. Hgb A1c  Recent Labs  02/24/16 0847  HGBA1C 7.4*   Lipid Profile  Recent Labs  02/25/16 0313  CHOL 127  HDL 25*  LDLCALC 67  TRIG 173*  CHOLHDL 5.1   Thyroid function studies  Recent Labs  02/25/16 0933  TSH 3.919   Anemia work up No results for input(s): VITAMINB12, FOLATE, FERRITIN, TIBC, IRON, RETICCTPCT in the last 72 hours. Microbiology No results found for this or any previous visit (from the past 240 hour(s)).   Discharge Instructions:   Discharge Instructions    (HEART FAILURE PATIENTS) Call MD:  Anytime you have any of the following symptoms: 1) 3 pound weight gain in 24  hours or 5 pounds in 1 week 2) shortness of breath, with or without a dry hacking cough 3) swelling in the hands, feet or stomach 4) if you have to sleep on extra pillows at night in order to breathe.    Complete by:  As directed   Call MD for:  extreme fatigue    Complete by:  As directed   Call MD for:  persistant dizziness or light-headedness    Complete by:  As directed   Call MD for:  persistant nausea and vomiting    Complete by:  As directed   Diet - low sodium heart healthy    Complete by:  As directed   Diet Carb Modified    Complete by:  As directed   Increase activity slowly    Complete by:  As directed       Medication List    TAKE these medications   acetaminophen 325 MG tablet Commonly known as:  TYLENOL Take 1-2 tablets (325-650 mg total) by mouth every 4 (four) hours as needed for mild pain.   albuterol (2.5 MG/3ML) 0.083% nebulizer solution Commonly known as:  PROVENTIL Take 3 mLs (2.5 mg total) by nebulization every 6 (six) hours as needed for wheezing. DX  491.9   albuterol 108 (90 Base) MCG/ACT inhaler Commonly known as:  PROVENTIL HFA;VENTOLIN HFA Inhale 2 puffs into the lungs every 4 (four) hours as needed for wheezing or shortness  of breath. Take two puffs four times a day as needed   ALPRAZolam 0.5 MG tablet Commonly known as:  XANAX Take 0.5 mg by mouth 3 (three) times daily.   apixaban 5 MG Tabs tablet Commonly known as:  ELIQUIS Take 1 tablet (5 mg total) by mouth 2 (two) times daily.   aspirin EC 81 MG tablet Take 81 mg by mouth every morning.   atorvastatin 40 MG tablet Commonly known as:  LIPITOR Take 1 tablet (40 mg total) by mouth at bedtime.   azelastine 0.1 % nasal spray Commonly known as:  ASTELIN Place 1 spray into both nostrils 2 (two) times daily. Use in each nostril as directed   benzonatate 200 MG capsule Commonly known as:  TESSALON TAKE 1 CAPSULE 3 TIMES DAILY AS NEEDED FOR COUGH   calcium carbonate 600 MG Tabs tablet Commonly  known as:  OS-CAL Take 600 mg by mouth at bedtime.   carvedilol 6.25 MG tablet Commonly known as:  COREG Take 0.5 tablets (3.125 mg total) by mouth 2 (two) times daily. What changed:  medication strength   cholecalciferol 1000 units tablet Commonly known as:  VITAMIN D Take 1,000 Units by mouth daily.   dexlansoprazole 60 MG capsule Commonly known as:  DEXILANT Take 1 capsule (60 mg total) by mouth every morning.   EPINEPHrine 0.3 mg/0.3 mL Soaj injection Commonly known as:  EPI-PEN Inject 0.3 mLs (0.3 mg total) into the muscle as needed. What changed:  reasons to take this   ferrous sulfate 325 (65 FE) MG tablet Take 1 tablet (325 mg total) by mouth 2 (two) times daily.   fluticasone 50 MCG/ACT nasal spray Commonly known as:  FLONASE Place 2 sprays into the nose daily.   fluticasone furoate-vilanterol 200-25 MCG/INH Aepb Commonly known as:  BREO ELLIPTA Inhale 1 puff into the lungs daily. What changed:  when to take this  reasons to take this   furosemide 40 MG tablet Commonly known as:  LASIX Take 40 mg by mouth daily.   losartan 50 MG tablet Commonly known as:  COZAAR Take 1 tablet (50 mg total) by mouth daily.   metFORMIN 500 MG tablet Commonly known as:  GLUCOPHAGE Take 1 tablet (500 mg total) by mouth every evening.   montelukast 10 MG tablet Commonly known as:  SINGULAIR Take 1 tablet (10 mg total) by mouth at bedtime.   PARoxetine 20 MG tablet Commonly known as:  PAXIL Take 20 mg by mouth at bedtime.   senna-docusate 8.6-50 MG tablet Commonly known as:  Senokot-S Take 2 tablets by mouth 2 (two) times daily. What changed:  when to take this  reasons to take this   vitamin B-12 250 MCG tablet Commonly known as:  CYANOCOBALAMIN Take 250 mcg by mouth daily.      Follow-up Information    Mertie Moores, MD .   Specialty:  Cardiology Contact information: [address] [place] [address] [phone]             Time coordinating discharge: 30 minutes.  Signed:  RAMA,CHRISTINA  Pager [phone] [facility] 02/25/2016, 7:11 PM

## 2016-02-25 NOTE — Plan of Care (Signed)
Problem: Safety: Goal: Ability to remain free from injury will improve Outcome: Progressing Patient is aware not to get OOB without calling for assistance and has her personal possessions within reach. Patient's husband is in the room with her and helps with some things but her bed alarm is on and I explained why we put in on and she is okay with that and will call if she needs assistance, will continue to monitor.

## 2016-02-25 NOTE — Progress Notes (Addendum)
Progress Note    [name] [name]  [id number] DOB: 09/16/1950  DOA: 02/24/2016 PCP: Velna Hatchet, MD    Brief Narrative:   [name] is an 65 y.o. female with a Past Medical History of anemia, anxiety, asthma, Barrett's esophagitis, COPD, depression, HLD, HTN, MI, Diabetes, CVA with residual right arm weakness who Was admitted 02/24/16 with a chief complaint of chest pain and findings concerning for possible recurrent stroke affecting the right upper extremity. Workup in the ED included a CT of the head which was negative, a CT angiogram of the chest which was negative for pulmonary embolism. EKG showed atrial fibrillation. Troponin not elevated. BNP 48.6.  Assessment/Plan:   Principal Problems:   Chest pain, likely related to transient ischemia in the setting of tachycardia Possibly related to demand ischemia in the setting of tachycardia. Patient was noted to have a run of SVT with a heart rate in the 140s by EMS. She was given 324 mg of aspirin which resolved her pain. 12-lead EKG showed atrial fibrillation. Last 2-D echo performed on 02/22/2016, with normal LV function and diastolic function, ejection fraction 55-60%. Last stress test performed on 02/23/2015, normal. Troponins negative 3. Recently had an outpatient cardiac monitor. Will ask cardiology to evaluate, as I suspect her symptoms are being triggered by tachyarrhythmias. Check TSH.    Strokelike symptoms/possible TIA Patient reported dizziness, right-sided arm weakness, and word finding difficulty on initial evaluation. CT of the head was negative. Stroke workup initiated. MRI of the brain normal. Normal intracranial MR angiography. Normal MR angiography of the neck with the exception of some tortuosity of the internal carotid arteries.  Active Problems:   Hypertension Continue Lasix, Coreg and Cozaar.    COPD Continue Breo and albuterol as needed.    Hyperlipidemia Continue Lipitor. Cholesterol 127, LDL  67.    Diabetes mellitus with complication ([place]) Managed with metformin at home, which is currently on hold. Currently being managed with insulin sensitive SSI 3 times a day. Hemoglobin A1c 7.4%. CBGs 150-250.    Atrial fibrillation with RVR ([facility]CC) CHA2DS2-VASc score 7. Heart rate currently controlled on Coreg, but suspect she is having PAF with high heart rates as noted above.  Needs anti-coagulation, and cardiology has recommended NOAC. Eliquis started.    History of esophageal stricture/GERD Continue PPI therapy.    Anxiety Continue Paxil and Xanax as needed.    Hypokalemia Likely secondary to diuretic therapy. Supplemented.   Family Communication/Anticipated D/C date and plan/Code Status   DVT prophylaxis: Lovenox ordered. Code Status: Full Code.  Family Communication: Husband updated at the bedside. Disposition Plan: Home in 24 hours, depending on cardiology recommendations.   Medical Consultants:    Cardiology.   Procedures:   None.  Anti-Infectives:   Anti-infectives    None      Subjective:    [name] [name] is currently without chest pain, shoulder pain or shortness of breath.  Her symptoms have completely resolved.  Describes a sensation of a racing heart followed by extreme fatigue intermittently.  Objective:    Vitals:   02/24/16 2014 02/25/16 0331 02/25/16 0944 02/25/16 0948  BP: 100/70 120/61 128/82   Pulse: 65 72    Resp: 16 16    Temp: 97.6 F (36.4 C) 97.7 F (36.5 C) 97.5 F (36.4 C)   TempSrc: Oral Oral Oral   SpO2: 98% 96% 97% 97%  Weight:  79.5 kg (175 lb 4.8 oz)    Height:  Intake/Output Summary (Last 24 hours) at 02/25/16 1131 Last data filed at 02/25/16 0900  Gross per 24 hour  Intake              840 ml  Output             2450 ml  Net            -1610 ml   Filed Weights   02/24/16 0323 02/24/16 1128 02/25/16 0331  Weight: 78 kg (172 lb) 78.9 kg (173 lb 14.4 oz) 79.5 kg (175 lb 4.8 oz)    Exam: General  exam: Appears calm and comfortable.  Respiratory system: Clear to auscultation. Respiratory effort normal. Cardiovascular system: S1 & S2 heard, RRR. No JVD,  rubs, gallops or clicks. No murmurs. Gastrointestinal system: Abdomen is nondistended, soft and nontender. No organomegaly or masses felt. Normal bowel sounds heard. Central nervous system: Alert and oriented. No focal neurological deficits. Extremities: No clubbing,  or cyanosis. No edema. Skin: No rashes, lesions or ulcers. Psychiatry: Judgement and insight appear normal. Mood & affect appropriate.   Data Reviewed:   I have personally reviewed following labs and imaging studies:  Labs: Basic Metabolic Panel:  Recent Labs Lab 02/24/16 0324 02/25/16 0943  NA 140 138  K 2.8* 4.6  CL 105 106  CO2 24 25  GLUCOSE 133* 229*  BUN 9 9  CREATININE 0.70 0.68  CALCIUM 9.0 9.1  MG 1.7 2.2   GFR Estimated Creatinine Clearance: 67.9 mL/min (by C-G formula based on SCr of 0.8 mg/dL). Liver Function Tests: No results for input(s): AST, ALT, ALKPHOS, BILITOT, PROT, ALBUMIN in the last 168 hours. No results for input(s): LIPASE, AMYLASE in the last 168 hours. No results for input(s): AMMONIA in the last 168 hours. Coagulation profile  Recent Labs Lab 02/24/16 0847  INR 1.10    CBC:  Recent Labs Lab 02/24/16 0324  WBC 7.2  HGB 12.4  HCT 38.3  MCV 92.3  PLT 220   Cardiac Enzymes:  Recent Labs Lab 02/24/16 0847 02/24/16 1300 02/24/16 1503  TROPONINI <0.03 <0.03 <0.03   BNP (last 3 results) No results for input(s): PROBNP in the last 8760 hours. CBG:  Recent Labs Lab 02/24/16 1311 02/24/16 1625 02/24/16 2011 02/25/16 0750  GLUCAP 250* 218* 198* 150*   D-Dimer: No results for input(s): DDIMER in the last 72 hours. Hgb A1c:  Recent Labs  02/24/16 0847  HGBA1C 7.4*   Lipid Profile:  Recent Labs  02/25/16 0313  CHOL 127  HDL 25*  LDLCALC 67  TRIG 173*  CHOLHDL 5.1   Thyroid function  studies: No results for input(s): TSH, T4TOTAL, T3FREE, THYROIDAB in the last 72 hours.  Invalid input(s): FREET3 Anemia work up: No results for input(s): VITAMINB12, FOLATE, FERRITIN, TIBC, IRON, RETICCTPCT in the last 72 hours. Sepsis Labs:  Recent Labs Lab 02/24/16 0324  WBC 7.2   Urine analysis:    Component Value Date/Time   COLORURINE YELLOW 06/11/2014 1411   APPEARANCEUR CLEAR 06/11/2014 1411   LABSPEC 1.024 06/11/2014 1411   PHURINE 5.5 06/11/2014 1411   GLUCOSEU 100 (A) 06/11/2014 1411   HGBUR NEGATIVE 06/11/2014 1411   BILIRUBINUR NEGATIVE 06/11/2014 1411   KETONESUR NEGATIVE 06/11/2014 1411   PROTEINUR NEGATIVE 06/11/2014 1411   UROBILINOGEN 0.2 06/11/2014 1411   NITRITE NEGATIVE 06/11/2014 1411   LEUKOCYTESUR NEGATIVE 06/11/2014 1411   Microbiology No results found for this or any previous visit (from the past 240 hour(s)).  Radiology: Dg Chest 2  View  Result Date: 02/24/2016 CLINICAL DATA:  65 year old female with chest pain EXAM: CHEST  2 VIEW COMPARISON:  Chest radiograph dated 06/21/2014 FINDINGS: Two views of the chest do not demonstrate a focal consolidation. There is no pleural effusion or pneumothorax. The cardiac silhouette is within normal limits. There is degenerative changes of the spine and shoulders. No acute osseous pathology. IMPRESSION: No active cardiopulmonary disease. Electronically Signed   By: Anner Crete M.D.   On: 02/24/2016 04:42  Ct Head Wo Contrast  Result Date: 02/24/2016 CLINICAL DATA:  Headache with right-sided numbness and weakness for 1 day EXAM: CT HEAD WITHOUT CONTRAST TECHNIQUE: Contiguous axial images were obtained from the base of the skull through the vertex without intravenous contrast. Note that the patient had received intravenous contrast for a different study approximately 3 hours prior. COMPARISON:  None. FINDINGS: Brain: The ventricles are normal in size and configuration. There is no intracranial mass, hemorrhage,  extra-axial fluid collection, or midline shift. Gray-white compartments are normal. No acute infarct evident. Vascular: There is no appreciable hyperdense vessel. No vascular calcification is demonstrable. Skull: The bony calvarium appears intact. Sinuses/Orbits: Visualized paranasal sinuses are clear. Visualized orbits appear symmetric bilaterally. Other: Mastoid air cells are clear. IMPRESSION: Study within normal limits. Electronically Signed   By: Lowella Grip III M.D.   On: 02/24/2016 08:28  Ct Angio Chest Pe W Or Wo Contrast  Result Date: 02/24/2016 CLINICAL DATA:  65 year old female with chest pain EXAM: CT ANGIOGRAPHY CHEST WITH CONTRAST TECHNIQUE: Multidetector CT imaging of the chest was performed using the standard protocol during bolus administration of intravenous contrast. Multiplanar CT image reconstructions and MIPs were obtained to evaluate the vascular anatomy. CONTRAST:  100 cc Isovue 370 COMPARISON:  Chest radiograph dated 02/24/2016 FINDINGS: Linear left lung base atelectasis/scarring noted. Minimal bibasilar dependent atelectatic changes seen. There is no focal consolidation, pleural effusion, or pneumothorax. The central airways are patent. There is mild atherosclerotic calcification of the aorta. No CT evidence of pulmonary embolism. Top-normal cardiac size. There is coronary vascular calcification. There is no pericardial effusion. There is no hilar or mediastinal adenopathy. Small hiatal hernia. The esophagus is grossly unremarkable. There is no axillary or supraclavicular adenopathy. The chest wall soft tissues appear unremarkable. There is mild degenerative changes of the spine. No acute fracture. Cholecystectomy. The visualized upper abdomen is otherwise unremarkable. Review of the MIP images confirms the above findings. IMPRESSION: No acute intrathoracic pathology. No CT evidence of pulmonary embolism. Electronically Signed   By: Anner Crete M.D.   On: 02/24/2016  05:58  Mr Jodene Nam Head Wo Contrast  Result Date: 02/24/2016 CLINICAL DATA:  Previous stroke with residual right-sided weakness. Acute onset of occipital headache an worsened right-sided weakness this morning. EXAM: MRI HEAD WITHOUT AND WITH CONTRAST MRA HEAD WITHOUT CONTRAST MRA NECK WITHOUT AND WITH CONTRAST TECHNIQUE: Multiplanar, multiecho pulse sequences of the brain and surrounding structures were obtained without and with intravenous contrast. Angiographic images of the Circle of Willis were obtained using MRA technique without intravenous contrast. Angiographic images of the neck were obtained using MRA technique without and with intravenous contrast. Carotid stenosis measurements (when applicable) are obtained utilizing NASCET criteria, using the distal internal carotid diameter as the denominator. CONTRAST:  61mL MULTIHANCE GADOBENATE DIMEGLUMINE 529 MG/ML IV SOLN COMPARISON:  Head CT same day.  MRI 10/26/2013. FINDINGS: MRI HEAD FINDINGS The brain again has normal appearance without evidence of malformation, atrophy, old or acute infarction. Mass lesion, hemorrhage, hydrocephalus or extra-axial collection. After contrast  administration, no abnormal enhancement occurs. No pituitary mass. No inflammatory sinus disease. No skull or skullbase lesion. MRA HEAD FINDINGS Both internal carotid arteries are widely patent into the brain. No siphon stenosis. The anterior and middle cerebral vessels are patent without proximal stenosis, aneurysm or vascular malformation. Posterior cerebral arteries take fetal origin from the anterior circulation.Both vertebral arteries are widely patent to the basilar. Proximal basilar fenestration, not significant. No basilar stenosis. Posterior circulation branch vessels appear normal. MRA NECK FINDINGS Branching pattern of the brachiocephalic vessels from the arch is normal. No origin stenosis. Both common carotid arteries are widely patent to their respective bifurcation. Both  carotid bifurcations are widely patent. Cervical internal carotid arteries are tortuous but widely patent. Both vertebral arteries are patent. The origins are not well seen because of motion artifact in the chest. No abnormality seen in the cervical region. Both vessels are patent to the basilar. IMPRESSION: Normal MRI of the brain. Normal intracranial MR angiography. Normal MR angiography of the neck vessels with the exception of some tortuosity of the internal carotid arteries which can be seen with a history of hypertension. Note that the vertebral artery origins are not well seen because of breathing motion in the chest, but I have no reason to suspect that they are diseased. Electronically Signed   By: Nelson Chimes M.D.   On: 02/24/2016 11:22  Mr Angiogram Neck W Wo Contrast  Result Date: 02/24/2016 CLINICAL DATA:  Previous stroke with residual right-sided weakness. Acute onset of occipital headache an worsened right-sided weakness this morning. EXAM: MRI HEAD WITHOUT AND WITH CONTRAST MRA HEAD WITHOUT CONTRAST MRA NECK WITHOUT AND WITH CONTRAST TECHNIQUE: Multiplanar, multiecho pulse sequences of the brain and surrounding structures were obtained without and with intravenous contrast. Angiographic images of the Circle of Willis were obtained using MRA technique without intravenous contrast. Angiographic images of the neck were obtained using MRA technique without and with intravenous contrast. Carotid stenosis measurements (when applicable) are obtained utilizing NASCET criteria, using the distal internal carotid diameter as the denominator. CONTRAST:  54mL MULTIHANCE GADOBENATE DIMEGLUMINE 529 MG/ML IV SOLN COMPARISON:  Head CT same day.  MRI 10/26/2013. FINDINGS: MRI HEAD FINDINGS The brain again has normal appearance without evidence of malformation, atrophy, old or acute infarction. Mass lesion, hemorrhage, hydrocephalus or extra-axial collection. After contrast administration, no abnormal enhancement  occurs. No pituitary mass. No inflammatory sinus disease. No skull or skullbase lesion. MRA HEAD FINDINGS Both internal carotid arteries are widely patent into the brain. No siphon stenosis. The anterior and middle cerebral vessels are patent without proximal stenosis, aneurysm or vascular malformation. Posterior cerebral arteries take fetal origin from the anterior circulation.Both vertebral arteries are widely patent to the basilar. Proximal basilar fenestration, not significant. No basilar stenosis. Posterior circulation branch vessels appear normal. MRA NECK FINDINGS Branching pattern of the brachiocephalic vessels from the arch is normal. No origin stenosis. Both common carotid arteries are widely patent to their respective bifurcation. Both carotid bifurcations are widely patent. Cervical internal carotid arteries are tortuous but widely patent. Both vertebral arteries are patent. The origins are not well seen because of motion artifact in the chest. No abnormality seen in the cervical region. Both vessels are patent to the basilar. IMPRESSION: Normal MRI of the brain. Normal intracranial MR angiography. Normal MR angiography of the neck vessels with the exception of some tortuosity of the internal carotid arteries which can be seen with a history of hypertension. Note that the vertebral artery origins are not well seen  because of breathing motion in the chest, but I have no reason to suspect that they are diseased. Electronically Signed   By: Nelson Chimes M.D.   On: 02/24/2016 11:22  Mr Jeri Cos [id number] Contrast  Result Date: 02/24/2016 CLINICAL DATA:  Previous stroke with residual right-sided weakness. Acute onset of occipital headache an worsened right-sided weakness this morning. EXAM: MRI HEAD WITHOUT AND WITH CONTRAST MRA HEAD WITHOUT CONTRAST MRA NECK WITHOUT AND WITH CONTRAST TECHNIQUE: Multiplanar, multiecho pulse sequences of the brain and surrounding structures were obtained without and with  intravenous contrast. Angiographic images of the Circle of Willis were obtained using MRA technique without intravenous contrast. Angiographic images of the neck were obtained using MRA technique without and with intravenous contrast. Carotid stenosis measurements (when applicable) are obtained utilizing NASCET criteria, using the distal internal carotid diameter as the denominator. CONTRAST:  31mL MULTIHANCE GADOBENATE DIMEGLUMINE 529 MG/ML IV SOLN COMPARISON:  Head CT same day.  MRI 10/26/2013. FINDINGS: MRI HEAD FINDINGS The brain again has normal appearance without evidence of malformation, atrophy, old or acute infarction. Mass lesion, hemorrhage, hydrocephalus or extra-axial collection. After contrast administration, no abnormal enhancement occurs. No pituitary mass. No inflammatory sinus disease. No skull or skullbase lesion. MRA HEAD FINDINGS Both internal carotid arteries are widely patent into the brain. No siphon stenosis. The anterior and middle cerebral vessels are patent without proximal stenosis, aneurysm or vascular malformation. Posterior cerebral arteries take fetal origin from the anterior circulation.Both vertebral arteries are widely patent to the basilar. Proximal basilar fenestration, not significant. No basilar stenosis. Posterior circulation branch vessels appear normal. MRA NECK FINDINGS Branching pattern of the brachiocephalic vessels from the arch is normal. No origin stenosis. Both common carotid arteries are widely patent to their respective bifurcation. Both carotid bifurcations are widely patent. Cervical internal carotid arteries are tortuous but widely patent. Both vertebral arteries are patent. The origins are not well seen because of motion artifact in the chest. No abnormality seen in the cervical region. Both vessels are patent to the basilar. IMPRESSION: Normal MRI of the brain. Normal intracranial MR angiography. Normal MR angiography of the neck vessels with the exception of  some tortuosity of the internal carotid arteries which can be seen with a history of hypertension. Note that the vertebral artery origins are not well seen because of breathing motion in the chest, but I have no reason to suspect that they are diseased. Electronically Signed   By: Nelson Chimes M.D.   On: 02/24/2016 11:22   Medications:   . ALPRAZolam  0.5 mg Oral TID  . aspirin EC  81 mg Oral q morning - 10a  . atorvastatin  40 mg Oral QHS  . azelastine  1 spray Each Nare BID  . carvedilol  3.125 mg Oral BID WC  . ferrous sulfate  325 mg Oral BID  . fluticasone  2 spray Each Nare Daily  . fluticasone furoate-vilanterol  1 puff Inhalation Daily  . furosemide  40 mg Oral Daily  . insulin aspart  0-9 Units Subcutaneous TID WC  . losartan  50 mg Oral Daily  . montelukast  10 mg Oral QHS  . pantoprazole  40 mg Oral Daily  . PARoxetine  20 mg Oral QHS   Continuous Infusions:   Time spent: 35 minutes with > 50% of time discussing current diagnostic test results, clinical impression and plan of care.   LOS: 0 days   RAMA,CHRISTINA  [facility] Pager [phone]. If unable to reach me  by pager, please call my cell phone at [phone].  *Please refer to [email or web address], password [email or web address] to get updated schedule on who will round on this patient, as hospitalists switch teams weekly. If 7PM-7AM, please contact night-coverage at [email or web address], password [email or web address] for any overnight needs.  02/25/2016, 11:31 AM

## 2016-02-25 NOTE — Plan of Care (Signed)
Problem: Education: Goal: Knowledge of  General Education information/materials will improve Outcome: Completed/Met Date Met: 02/25/16 All D/C instructions reviewed and questions answered, patient went home with copy of D/C instructions.

## 2016-02-25 NOTE — Progress Notes (Addendum)
PT Cancellation Note  Patient Details Name: [name] [name] MRN: [id number] DOB: 08/18/1950   Cancelled Treatment:    Reason Eval/Treat Not Completed: PT screened, no needs identified, will sign off. Spoke with pt and family-pt denies any further need for PT services. She was been walking often without difficulty per pt report. Will sign off at pt's request.    Weston Anna, MPT Pager: [phone]

## 2016-02-25 NOTE — Progress Notes (Signed)
Occupational Therapy Evaluation Patient Details Name: [name] [name] MRN: [id number] DOB: Jan 18, 1951 Today's Date: 02/25/2016    History of Present Illness 65 y.o. female admitted to [facility] on 02/24/16 for chest pain, dizziness, right arm weakness.  Stroke and cardiac workup pending.  MRI was negative for acute events.  Pt with significant PMHx of DM, SOB, MI, HTN, COPD, aortic valve disorder, anemia, Bil TKA, shoulder surgery, and L foot surgery.   Clinical Impression   PTA, pt was independent with all ADLs and occasionally used [facility] for mobility. Pt currently requires supervision for ADLs and basic transfers due to abnormal gait, RUE weakness, decreased attention and safety awareness. Pt plans to d/c home with 24/7 assistance from her husband. Pt will benefit from continued acute OT to increase independence and safety with ADLs and mobility to allow for safe discharge home. No OT follow up or DME recommended at this time.    Follow Up Recommendations  No OT follow up;Supervision - Intermittent    Equipment Recommendations  None recommended by OT    Recommendations for Other Services       Precautions / Restrictions Precautions Precautions: Fall Precaution Comments: pt with remote h/o falls Restrictions Weight Bearing Restrictions: No      Mobility Bed Mobility Overal bed mobility: Modified Independent                Transfers Overall transfer level: Needs assistance Equipment used: None Transfers: Sit to/from Stand Sit to Stand: Supervision         General transfer comment: No physical assistance required. Pt's HR 103, RR 81, SpO2 96% while ambulating in hallway. Pt required safety cues due to increased distractibility while completing ADLs in room and while in hallway.     Balance Overall balance assessment: Needs assistance Sitting-balance support: No upper extremity supported;Feet supported Sitting balance-Leahy Scale: Good     Standing balance support:  No upper extremity supported;During functional activity Standing balance-Leahy Scale: Good                              ADL Overall ADL's : Needs assistance/impaired Eating/Feeding: Modified independent;Sitting   Grooming: Wash/dry hands;Wash/dry face;Supervision/safety;Standing   Upper Body Bathing: Supervision/ safety;Sitting   Lower Body Bathing: Supervison/ safety;Sit to/from stand   Upper Body Dressing : Supervision/safety;Sitting   Lower Body Dressing: Supervision/safety;Sit to/from stand   Toilet Transfer: Supervision/safety;Ambulation;Regular Toilet   Toileting- Water quality scientist and Hygiene: Supervision/safety;Sit to/from stand   Tub/ Shower Transfer: Tub transfer;Supervision/safety;Ambulation;Tub bench   Functional mobility during ADLs: Supervision/safety General ADL Comments: Supervision provided primarily for safety as pt has abnormal gait and RUE weakness at baseline.      Vision Vision Assessment?: No apparent visual deficits   Perception     Praxis      Pertinent Vitals/Pain Pain Assessment: No/denies pain     Hand Dominance  (ambidextrous)   Extremity/Trunk Assessment Upper Extremity Assessment Upper Extremity Assessment: RUE deficits/detail RUE Deficits / Details: limited strength (2+/5) and AROM shoulder flexion/abduction ~90, able to achieve 110 PROM using LUE to assist; others wfl; reports numbness/tingling has resolved since yesterday. Pt tends to avoid using RUE due to weakness, decreased fine and gross motor coordination noted RUE Coordination: decreased fine motor;decreased gross motor   Lower Extremity Assessment Lower Extremity Assessment: Overall WFL for tasks assessed (h/o bil TKA)   Cervical / Trunk Assessment Cervical / Trunk Assessment: Other exceptions Cervical / Trunk Exceptions: increased lumbar lordosis (sway  back) posture   Communication Communication Communication: No difficulties   Cognition  Arousal/Alertness: Awake/alert Behavior During Therapy: WFL for tasks assessed/performed Overall Cognitive Status: Impaired/Different from baseline Area of Impairment: Attention;Safety/judgement;Problem solving   Current Attention Level: Selective     Safety/Judgement: Decreased awareness of safety   Problem Solving: Slow processing;Difficulty sequencing;Requires verbal cues General Comments: Pt noted to have appropriate, but delayed responses when asked questions about home and personal interests and pt's husband would often answer before her. Pt appeared to become easily distracted at times requiring some cues for safety and sequencing, but this is likely due to being in an unfamiliar environment.   General Comments       Exercises       Shoulder Instructions      Home Living Family/patient expects to be discharged to:: Private residence Living Arrangements: Spouse/significant other Available Help at Discharge: Family;Available 24 hours/day Type of Home: House Home Access: Stairs to enter [employer] of Steps: 2 Entrance Stairs-Rails: None Home Layout: Laundry or work area in basement     [employer] Shower/Tub: Tub/shower unit Shower/tub characteristics: Architectural technologist: Standard     Home Equipment: Environmental consultant - 2 wheels;Cane - single point;Bedside commode;Tub bench          Prior Functioning/Environment Level of Independence: Independent with assistive device(s)        Comments: Uses SPC or RW when she "feels lightheaded or unstable" but reports this is rare. Pt likes to crochet, knit, sew, and paint.    OT Diagnosis: Paresis;Cognitive deficits   OT Problem List: Decreased strength;Decreased range of motion;Impaired balance (sitting and/or standing);Decreased safety awareness;Impaired UE functional use   OT Treatment/Interventions: Self-care/ADL training;Therapeutic exercise;DME and/or AE instruction;Therapeutic activities;Patient/family  education;Balance training    OT Goals(Current goals can be found in the care plan section) Acute Rehab OT Goals Patient Stated Goal: to eat and go home soon OT Goal Formulation: With patient Time For Goal Achievement: 03/10/16 Potential to Achieve Goals: Good ADL Goals Pt Will Perform Upper Body Dressing: with modified independence;sitting Pt Will Perform Lower Body Dressing: with modified independence;sit to/from stand Pt Will Perform Tub/Shower Transfer: Tub transfer;with modified independence;ambulating;tub bench Pt/caregiver will Perform Home Exercise Program: Increased ROM;Increased strength;Right Upper extremity;With written HEP provided;Independently Additional ADL Goal #1: Pt/husband will verbalize 2 fall prevention strategies to use at home to minimize fall risk.  OT Frequency: Min 2X/week   Barriers to D/C:            Co-evaluation              End of Session Equipment Utilized During Treatment: Gait belt Nurse Communication: Mobility status  Activity Tolerance: Patient tolerated treatment well Patient left: in chair;with call bell/phone within reach;with chair alarm set;with family/visitor present   Time: [id number] OT Time Calculation (min): 20 min Charges:  OT General Charges $OT Visit: 1 Procedure OT Evaluation $OT Eval Moderate Complexity: 1 Procedure G-Codes: OT G-codes **NOT FOR INPATIENT CLASS** Functional Assessment Tool Used: clinical judgement Functional Limitation: Self care Self Care Current Status [id number]): At least 1 percent but less than 20 percent impaired, limited or restricted Self Care Goal Status [id number]): At least 1 percent but less than 20 percent impaired, limited or restricted  Redmond Baseman, OTR/L Pager: [phone] 02/25/2016, 9:07 AM

## 2016-02-25 NOTE — Plan of Care (Signed)
Problem: Tissue Perfusion: Goal: Risk factors for ineffective tissue perfusion will decrease Outcome: Completed/Met Date Met: 02/25/16 Pt D/C'd with written instructions that were reviewed and patient verbalized understanding

## 2016-02-25 NOTE — Plan of Care (Signed)
Problem: Physical Regulation: Goal: Will remain free from infection Outcome: Completed/Met Date Met: 02/25/16 Pt D/C'd with VSS and no S/S of infection noted, written instructions reviewed of when to call MD/EMS and patient verbalized understanding.

## 2016-02-25 NOTE — Progress Notes (Signed)
Report received in patient's room via Ebony Hail RN using [employer], reviewed chart, orders, labs,VS, meds and patient's general condition, assumed care of her.

## 2016-02-25 NOTE — Telephone Encounter (Signed)
Please see my other phone note from same admission  Wandra Mannan, MD Cardiology

## 2016-02-25 NOTE — Plan of Care (Signed)
Problem: Safety: Goal: Ability to remain free from injury will improve Outcome: Completed/Met Date Met: 02/25/16 Patient D/C'd via W/C with belongings, taken down by NT and husband in stable condition.

## 2016-02-25 NOTE — Plan of Care (Signed)
Problem: Physical Regulation: Goal: Ability to maintain clinical measurements within normal limits will improve Outcome: Completed/Met Date Met: 02/25/16 Patient D/C'd home with skin intact, VSS and no complaints, all written D/C instructions reviewed with [facility], Suicide and Home Health and patient verbalized understanding.

## 2016-02-25 NOTE — Consult Note (Signed)
Cardiology Consult    Patient ID: [name] [name] MRN: [id number], DOB/AGE: [birth year]   Admit date: 02/24/2016 Date of Consult: 02/25/2016  Primary Physician: Velna Hatchet, MD Primary Cardiologist: Dr. Acie Fredrickson Requesting Provider: Dr. Rockne Menghini Reason for Consultation: Chest pain  Patient Profile    65 year old female with past medical history of anemia, arthritis, asthma, COPD, hypertension, hyperlipidemia, diabetes II, takotsubo syndrome and PAF who presented to the [facility] ED with complaints of chest pain.   Past Medical History   Past Medical History:  Diagnosis Date  . Anemia    hx of years ago   . Anxiety   . Aortic valve disorders   . Arthritis   . Asthma   . Barrett esophagus   . COPD (chronic obstructive pulmonary disease) ([place])   . Depression   . Dysrhythmia    heart skips per pt   . Esophageal reflux   . Esophageal stricture   . GAD (generalized anxiety disorder)   . GERD (gastroesophageal reflux disease)   . Heart murmur   . Hiatal hernia   . History of kidney stones 01-08-13   past hx.  . Hyperlipemia   . Hypertension 12/07/2012  . Insomnia, unspecified   . Myocardial infarction [facility]) 01-08-13   '06-Chest pain-no stent-dx. MI-stress related  . Osteoporosis   . Personal history of colonic polyps 09/2010   TUBULAR ADENOMAS (X3); NEGATIVE FOR HIGH GRADE DYSPLASIA OR MALIGNANCY.  [name] Pneumonia    hx of   . Shortness of breath    with exertion   . Stroke ([place])    hx of 2 -remains with some right sided weaknes  . Transfusion history 01-08-13   past hx. many yrs ago  . Type II or unspecified type diabetes mellitus without mention of complication, not stated as uncontrolled    on no meds   . Urinary frequency    AND INCONTINENCE    Past Surgical History:  Procedure Laterality Date  . CARDIAC CATHETERIZATION     normal per Dr Acie Fredrickson in note dated 02/06/12   . CATARACT EXTRACTION, BILATERAL    . CHOLECYSTECTOMY  2005  . ESOPHAGEAL DILATION  01-08-13   2  yrs ago  . FOOT SURGERY  2005   Left foot  . KNEE ARTHROSCOPY  02/28/2012   Procedure: ARTHROSCOPY KNEE;  Surgeon: Tobi Bastos, MD;  Location: [facility];  Service: Orthopedics;  Laterality: Left;  . PARTIAL HYSTERECTOMY    . SHOULDER SURGERY  2011  . TONSILLECTOMY    . TOTAL KNEE ARTHROPLASTY Left 01/17/2013   Procedure: LEFT TOTAL KNEE ARTHROPLASTY;  Surgeon: Tobi Bastos, MD;  Location: [facility];  Service: Orthopedics;  Laterality: Left;  . TOTAL KNEE ARTHROPLASTY Right 06/19/2014   Procedure: RIGHT TOTAL KNEE ARTHROPLASTY;  Surgeon: Tobi Bastos, MD;  Location: [facility];  Service: Orthopedics;  Laterality: Right;  . TUBAL LIGATION    . WRIST FLEXION TENDON TENOTOMIES AND PROXIMAL CORPECTOMY W/ WRIST ARTHRODESIS&ILIAC CREST BONE GRAFT       Allergies  Allergies  Allergen Reactions  . Ceclor [Cefaclor] Other (See Comments)    Reaction=burning all over  . Other Anaphylaxis    BEE STINGS- CLOSE OFF THROAT  . Penicillins Anaphylaxis    "CLOSES OFF MY BREATHING"  . Pneumococcal Vaccines Other (See Comments)    PP-23 vaccine; had BIG local red reaction with heat.   . Codeine Nausea And Vomiting  . Cortisone Hives    All over body  . Boniva [  Ibandronate Sodium] Other (See Comments)    Jaw popping    History of Present Illness    [name] [name] is a 65 year old female with past medical history of anemia, arthritis, asthma, COPD, hypertension, hyperlipidemia, diabetes II, takotsubo syndrome and PAF. She was most recently seen in the office by Dr. Acie Fredrickson on 01/26/2016 with reports of centralized chest pain and episodes of tachycardia that were associated with severe weakness, and dyspnea. At that time he was concerned that she was at risk for recurrent PAF, and made plans to place her on a 30 day event monitor, along with scheduled a Lexiscan Myoview in the outpatient setting. She reports picking up her 30 day event monitor on Tuesday afternoon, and also went to have her [place]  done. Her study showed a improved EF of 60%, with results showing low risk study.  She reports walking down the stairs in her home to do laundry. States during this time she started to experience centralized chest pain, along with shortness of breath. At that time she triggered event monitor, but continued to finish her laundry. States while walking back up the steps the chest pain increased, experiencing palpitations and worsening shortness of breath. She noted a voice message had been left on her answering machine instructing her to come to the ED regarding events noted on her Holter monitor. At that time they called for EMS to transport to [facility] ED. In review the notes prevent monitoring service patient did have episode of A. fib RVR with rates in the 160s onatransmission. With EMS she received 324 of aspirin, which helped to relieve her chest pain. Her rate apparently slowed on its own, but did note with EMS a brief episode of NSVT. Of note did report associated symptoms of right-sided arm and leg numbness and tingling associated with the chest pain and dyspnea.   Her admission labs showed a potassium of 2.8, BNP of 48, troponin cycled and negative 4, stable hemoglobin, and hemoglobin A1c of 7.4. EKG showed A. fib with a rate of 90. Did receive a CT chest and head which were negative for PE and acute stroke. Chest x-ray was negative. Internal medicine was called for admission and further workup. She did undergo MRI of the brain which was normal. In review of her telemetry she has been in A. fib since admission, but appears to have converted to normal sinus rhythm around 11 AM this morning. She has been on carvedilol 3.125 mg twice a day at home prior to this admission.  Inpatient Medications    . ALPRAZolam  0.5 mg Oral TID  . aspirin EC  81 mg Oral q morning - 10a  . atorvastatin  40 mg Oral QHS  . azelastine  1 spray Each Nare BID  . carvedilol  3.125 mg Oral BID WC  . ferrous sulfate  325  mg Oral BID  . fluticasone  2 spray Each Nare Daily  . fluticasone furoate-vilanterol  1 puff Inhalation Daily  . furosemide  40 mg Oral Daily  . insulin aspart  0-9 Units Subcutaneous TID WC  . losartan  50 mg Oral Daily  . montelukast  10 mg Oral QHS  . pantoprazole  40 mg Oral Daily  . PARoxetine  20 mg Oral QHS    Family History    Family History  Problem Relation Age of Onset  . Breast cancer Mother   . Stroke Mother   . Transient ischemic attack Mother   . Colon cancer  Brother 30  . Colon polyps Brother   . Heart failure Brother   . Heart attack Father   . Heart failure Father   . Diabetes Maternal Grandfather   . Kidney disease      Both sides of family  . Heart disease      Both sides of family  . Breast cancer Maternal Aunt   . Breast cancer Maternal Grandmother     Social History    Social History   Social History  . Marital status: Single    Spouse name: N/A  . Number of children: N/A  . Years of education: N/A   Occupational History  . Retired Disabled   Social History Main Topics  . Smoking status: Current Every Day Smoker    Packs/day: 0.50    Years: 40.00    Types: Cigarettes    Last attempt to quit: 06/24/2013  . Smokeless tobacco: Former Systems developer    Types: Snuff, Chew    Quit date: 12/31/2011  . Alcohol use 0.6 oz/week    1 Cans of beer per week     Comment: occasionally  . Drug use: No  . Sexual activity: Not on file   Other Topics Concern  . Not on file   Social History Narrative  . No narrative on file     Review of Systems    General:  No chills, fever, night sweats or weight changes.  Cardiovascular: See HPI Dermatological: No rash, lesions/masses Respiratory: See HPI Urologic: No hematuria, dysuria Abdominal:   No nausea, vomiting, diarrhea, bright red blood per rectum, melena, or hematemesis Neurologic:  No visual changes, wkns, changes in mental status. All other systems reviewed and are otherwise negative except as noted  above.  Physical Exam    Blood pressure 128/82, pulse 72, temperature 97.5 F (36.4 C), temperature source Oral, resp. rate 16, height 5' 0.5" (1.537 m), weight 175 lb 4.8 oz (79.5 kg), SpO2 97 %.  General: Pleasant older female, NAD Psych: Normal affect. Neuro: Alert and oriented X 3. Moves all extremities spontaneously. HEENT: Normal  Neck: Supple without bruits or JVD. Lungs:  Resp regular and unlabored, CTA. Heart: RRR no s3, s4, or murmurs. Abdomen: Soft, non-tender, non-distended, BS + x 4.  Extremities: No clubbing, cyanosis or edema. DP/PT/Radials 2+ and equal bilaterally.  Labs    Troponin [facility] of Care Test)  Recent Labs  02/24/16 0447  TROPIPOC 0.00    Recent Labs  02/24/16 0847 02/24/16 1300 02/24/16 1503  TROPONINI <0.03 <0.03 <0.03   Lab Results  Component Value Date   WBC 7.2 02/24/2016   HGB 12.4 02/24/2016   HCT 38.3 02/24/2016   MCV 92.3 02/24/2016   PLT 220 02/24/2016    Recent Labs Lab 02/25/16 0943  NA 138  K 4.6  CL 106  CO2 25  BUN 9  CREATININE 0.68  CALCIUM 9.1  GLUCOSE 229*   Lab Results  Component Value Date   CHOL 127 02/25/2016   HDL 25 (L) 02/25/2016   LDLCALC 67 02/25/2016   TRIG 173 (H) 02/25/2016   No results found for: [facility]   Radiology Studies    Dg Chest 2 View  Result Date: 02/24/2016 CLINICAL DATA:  65 year old female with chest pain EXAM: CHEST  2 VIEW COMPARISON:  Chest radiograph dated 06/21/2014 FINDINGS: Two views of the chest do not demonstrate a focal consolidation. There is no pleural effusion or pneumothorax. The cardiac silhouette is within normal limits. There is degenerative changes  of the spine and shoulders. No acute osseous pathology. IMPRESSION: No active cardiopulmonary disease. Electronically Signed   By: Anner Crete M.D.   On: 02/24/2016 04:42  Ct Head Wo Contrast  Result Date: 02/24/2016 CLINICAL DATA:  Headache with right-sided numbness and weakness for 1 day EXAM: CT HEAD WITHOUT  CONTRAST TECHNIQUE: Contiguous axial images were obtained from the base of the skull through the vertex without intravenous contrast. Note that the patient had received intravenous contrast for a different study approximately 3 hours prior. COMPARISON:  None. FINDINGS: Brain: The ventricles are normal in size and configuration. There is no intracranial mass, hemorrhage, extra-axial fluid collection, or midline shift. Gray-white compartments are normal. No acute infarct evident. Vascular: There is no appreciable hyperdense vessel. No vascular calcification is demonstrable. Skull: The bony calvarium appears intact. Sinuses/Orbits: Visualized paranasal sinuses are clear. Visualized orbits appear symmetric bilaterally. Other: Mastoid air cells are clear. IMPRESSION: Study within normal limits. Electronically Signed   By: Lowella Grip III M.D.   On: 02/24/2016 08:28  Ct Angio Chest Pe W Or Wo Contrast  Result Date: 02/24/2016 CLINICAL DATA:  65 year old female with chest pain EXAM: CT ANGIOGRAPHY CHEST WITH CONTRAST TECHNIQUE: Multidetector CT imaging of the chest was performed using the standard protocol during bolus administration of intravenous contrast. Multiplanar CT image reconstructions and MIPs were obtained to evaluate the vascular anatomy. CONTRAST:  100 cc Isovue 370 COMPARISON:  Chest radiograph dated 02/24/2016 FINDINGS: Linear left lung base atelectasis/scarring noted. Minimal bibasilar dependent atelectatic changes seen. There is no focal consolidation, pleural effusion, or pneumothorax. The central airways are patent. There is mild atherosclerotic calcification of the aorta. No CT evidence of pulmonary embolism. Top-normal cardiac size. There is coronary vascular calcification. There is no pericardial effusion. There is no hilar or mediastinal adenopathy. Small hiatal hernia. The esophagus is grossly unremarkable. There is no axillary or supraclavicular adenopathy. The chest wall soft tissues  appear unremarkable. There is mild degenerative changes of the spine. No acute fracture. Cholecystectomy. The visualized upper abdomen is otherwise unremarkable. Review of the MIP images confirms the above findings. IMPRESSION: No acute intrathoracic pathology. No CT evidence of pulmonary embolism. Electronically Signed   By: Anner Crete M.D.   On: 02/24/2016 05:58  Mr Jodene Nam Head Wo Contrast  Result Date: 02/24/2016 CLINICAL DATA:  Previous stroke with residual right-sided weakness. Acute onset of occipital headache an worsened right-sided weakness this morning. EXAM: MRI HEAD WITHOUT AND WITH CONTRAST MRA HEAD WITHOUT CONTRAST MRA NECK WITHOUT AND WITH CONTRAST TECHNIQUE: Multiplanar, multiecho pulse sequences of the brain and surrounding structures were obtained without and with intravenous contrast. Angiographic images of the Circle of Willis were obtained using MRA technique without intravenous contrast. Angiographic images of the neck were obtained using MRA technique without and with intravenous contrast. Carotid stenosis measurements (when applicable) are obtained utilizing NASCET criteria, using the distal internal carotid diameter as the denominator. CONTRAST:  43mL MULTIHANCE GADOBENATE DIMEGLUMINE 529 MG/ML IV SOLN COMPARISON:  Head CT same day.  MRI 10/26/2013. FINDINGS: MRI HEAD FINDINGS The brain again has normal appearance without evidence of malformation, atrophy, old or acute infarction. Mass lesion, hemorrhage, hydrocephalus or extra-axial collection. After contrast administration, no abnormal enhancement occurs. No pituitary mass. No inflammatory sinus disease. No skull or skullbase lesion. MRA HEAD FINDINGS Both internal carotid arteries are widely patent into the brain. No siphon stenosis. The anterior and middle cerebral vessels are patent without proximal stenosis, aneurysm or vascular malformation. Posterior cerebral arteries take fetal origin  from the anterior circulation.Both  vertebral arteries are widely patent to the basilar. Proximal basilar fenestration, not significant. No basilar stenosis. Posterior circulation branch vessels appear normal. MRA NECK FINDINGS Branching pattern of the brachiocephalic vessels from the arch is normal. No origin stenosis. Both common carotid arteries are widely patent to their respective bifurcation. Both carotid bifurcations are widely patent. Cervical internal carotid arteries are tortuous but widely patent. Both vertebral arteries are patent. The origins are not well seen because of motion artifact in the chest. No abnormality seen in the cervical region. Both vessels are patent to the basilar. IMPRESSION: Normal MRI of the brain. Normal intracranial MR angiography. Normal MR angiography of the neck vessels with the exception of some tortuosity of the internal carotid arteries which can be seen with a history of hypertension. Note that the vertebral artery origins are not well seen because of breathing motion in the chest, but I have no reason to suspect that they are diseased. Electronically Signed   By: Nelson Chimes M.D.   On: 02/24/2016 11:22  Mr Angiogram Neck W Wo Contrast  Result Date: 02/24/2016 CLINICAL DATA:  Previous stroke with residual right-sided weakness. Acute onset of occipital headache an worsened right-sided weakness this morning. EXAM: MRI HEAD WITHOUT AND WITH CONTRAST MRA HEAD WITHOUT CONTRAST MRA NECK WITHOUT AND WITH CONTRAST TECHNIQUE: Multiplanar, multiecho pulse sequences of the brain and surrounding structures were obtained without and with intravenous contrast. Angiographic images of the Circle of Willis were obtained using MRA technique without intravenous contrast. Angiographic images of the neck were obtained using MRA technique without and with intravenous contrast. Carotid stenosis measurements (when applicable) are obtained utilizing NASCET criteria, using the distal internal carotid diameter as the denominator.  CONTRAST:  55mL MULTIHANCE GADOBENATE DIMEGLUMINE 529 MG/ML IV SOLN COMPARISON:  Head CT same day.  MRI 10/26/2013. FINDINGS: MRI HEAD FINDINGS The brain again has normal appearance without evidence of malformation, atrophy, old or acute infarction. Mass lesion, hemorrhage, hydrocephalus or extra-axial collection. After contrast administration, no abnormal enhancement occurs. No pituitary mass. No inflammatory sinus disease. No skull or skullbase lesion. MRA HEAD FINDINGS Both internal carotid arteries are widely patent into the brain. No siphon stenosis. The anterior and middle cerebral vessels are patent without proximal stenosis, aneurysm or vascular malformation. Posterior cerebral arteries take fetal origin from the anterior circulation.Both vertebral arteries are widely patent to the basilar. Proximal basilar fenestration, not significant. No basilar stenosis. Posterior circulation branch vessels appear normal. MRA NECK FINDINGS Branching pattern of the brachiocephalic vessels from the arch is normal. No origin stenosis. Both common carotid arteries are widely patent to their respective bifurcation. Both carotid bifurcations are widely patent. Cervical internal carotid arteries are tortuous but widely patent. Both vertebral arteries are patent. The origins are not well seen because of motion artifact in the chest. No abnormality seen in the cervical region. Both vessels are patent to the basilar. IMPRESSION: Normal MRI of the brain. Normal intracranial MR angiography. Normal MR angiography of the neck vessels with the exception of some tortuosity of the internal carotid arteries which can be seen with a history of hypertension. Note that the vertebral artery origins are not well seen because of breathing motion in the chest, but I have no reason to suspect that they are diseased. Electronically Signed   By: Nelson Chimes M.D.   On: 02/24/2016 11:22  Mr Jeri Cos [id number] Contrast  Result Date: 02/24/2016 CLINICAL  DATA:  Previous stroke with residual right-sided weakness. Acute onset  of occipital headache an worsened right-sided weakness this morning. EXAM: MRI HEAD WITHOUT AND WITH CONTRAST MRA HEAD WITHOUT CONTRAST MRA NECK WITHOUT AND WITH CONTRAST TECHNIQUE: Multiplanar, multiecho pulse sequences of the brain and surrounding structures were obtained without and with intravenous contrast. Angiographic images of the Circle of Willis were obtained using MRA technique without intravenous contrast. Angiographic images of the neck were obtained using MRA technique without and with intravenous contrast. Carotid stenosis measurements (when applicable) are obtained utilizing NASCET criteria, using the distal internal carotid diameter as the denominator. CONTRAST:  56mL MULTIHANCE GADOBENATE DIMEGLUMINE 529 MG/ML IV SOLN COMPARISON:  Head CT same day.  MRI 10/26/2013. FINDINGS: MRI HEAD FINDINGS The brain again has normal appearance without evidence of malformation, atrophy, old or acute infarction. Mass lesion, hemorrhage, hydrocephalus or extra-axial collection. After contrast administration, no abnormal enhancement occurs. No pituitary mass. No inflammatory sinus disease. No skull or skullbase lesion. MRA HEAD FINDINGS Both internal carotid arteries are widely patent into the brain. No siphon stenosis. The anterior and middle cerebral vessels are patent without proximal stenosis, aneurysm or vascular malformation. Posterior cerebral arteries take fetal origin from the anterior circulation.Both vertebral arteries are widely patent to the basilar. Proximal basilar fenestration, not significant. No basilar stenosis. Posterior circulation branch vessels appear normal. MRA NECK FINDINGS Branching pattern of the brachiocephalic vessels from the arch is normal. No origin stenosis. Both common carotid arteries are widely patent to their respective bifurcation. Both carotid bifurcations are widely patent. Cervical internal carotid  arteries are tortuous but widely patent. Both vertebral arteries are patent. The origins are not well seen because of motion artifact in the chest. No abnormality seen in the cervical region. Both vessels are patent to the basilar. IMPRESSION: Normal MRI of the brain. Normal intracranial MR angiography. Normal MR angiography of the neck vessels with the exception of some tortuosity of the internal carotid arteries which can be seen with a history of hypertension. Note that the vertebral artery origins are not well seen because of breathing motion in the chest, but I have no reason to suspect that they are diseased. Electronically Signed   By: Nelson Chimes M.D.   On: 02/24/2016 11:22   ECG & Cardiac Imaging    EKG: A. Fib rate-90  Echo: 02/22/2016  Study Conclusions  - Left ventricle: The cavity size was normal. Systolic function was   normal. The estimated ejection fraction was in the range of 55%   to 60%. Left ventricular diastolic function parameters were   normal.  Assessment & Plan    [name] [name] is a 65 year old female with past medical history of anemia, arthritis, asthma, COPD, hypertension, hyperlipidemia, diabetes II, takotsubo syndrome and PAF. She was most recently seen in the office by Dr. Acie Fredrickson on 01/26/2016 with reports of centralized chest pain and episodes of tachycardia that were associated with severe weakness, and dyspnea. At that time he was concerned that she was at risk for recurrent PAF, and made plans to place her on a 30 day event monitor, along with scheduled a Lexiscan Myoview in the outpatient setting. She reports picking up her 30 day event monitor on Tuesday afternoon, and also went to have her [place] done. Her study showed a improved EF of 60%, with results showing low risk study.   1. PAF/A Fib RVR: She initially denied to the office on 01/26/2016 with reports of tachycardia, weakness and dyspnea. At that time the plans are made to set her up for 30 day  event monitor, and undergo outpatient Lexiscan Myoview. Her Lexiscan was a low risk study with her EF noted at 60%. Yesterday she reports walking downstairs to do laundry, during this she began to experience centralized chest pain, numbness in tingling into the right arm and leg along with dyspnea. She also really triggered her monitor, and walk back up the steps. Noted a message on answering machine stating she is to come to the ED for further evaluation. In reviewing the note she experienced A. fib RVR with rates in the 160s. Her EKG in the ED showed A. fib with a rate of 90. She was admitted to internal medicine for further workup and management. In review of telemetry she remained in A. fib until around 11 AM this point when she converted to sinus rhythm. She has not experienced any further chest pain since her admission. During the findings on her Holter monitor chest pain is likely secondary to demand ischemia during her episodes of A. fib RVR. -- Given her history of PAF, and episodes of A. fib RVR, would benefit from anticoagulation. I have discussed Eliquis with her, and she is agreeable to starting this medication. -- Likely need to increase her Coreg to 6.25mg  BID -- Instructed her to continue to wear an event monitor as outpatient setting to help determine whether her rate is controlled and maintained sinus rhythm. -- This patients CHA2DS2-VASc Score and unadjusted Ischemic Stroke Rate (% per year) is equal to 11.2 % stroke rate/year from a score of 7 Above score calculated as 1 point each if present [CHF, HTN, DM, Vascular=MI/PAD/Aortic Plaque, Age if 65-74, or Female] Above score calculated as 2 points each if present [Age > 75, or Stroke/TIA/TE] -- Would benefit from a EP discussion in the outpatient setting.  2. HTN: Controlled   3. HLD: Would increase Lipitor to 80mg     Signed, Reino Bellis, NP-C Pager [phone] 02/25/2016, 12:45 PM  I have seen and examined the patient along  with Reino Bellis, NP-C.  I have reviewed the chart, notes and new data.  I agree with NP's note.  Key new complaints: feels better after return to NSR Key examination changes: obese, normal CV exam Key new findings / data: nuclear scan, echo, ECG, labs reviewed  PLAN: She appears to have symptomatic paroxysmal atrial fibrillation on the background of minimal structural heart disease (left atrium mildly dilated, end-systolic diameter 40 mm, volume index 32 mL/m sq) and mild coronary atherosclerosis (calcification on CT, normal perfusion study). Discussed need for anticoagulation due to substantial stroke risk. Start DOAC today. Increase carvedilol for better rate control. Rate control strategy may be limited by relatively slow background sinus rate.  She expresses interest in RF ablation since her brother had good results with ablation for some type of atrial arrhythmia. I made sure that she understands the need for anticoagulation even if she has ablation. Has follow up with Dr. Acie Fredrickson already scheduled. Will also refer for discussion of ablation at her request, but a decision about ablation would be premature at this point.  Sanda Klein, MD, [place] [phone] 02/25/2016, 1:57 PM

## 2016-02-25 NOTE — Plan of Care (Signed)
Problem: Health Behavior/Discharge Planning: Goal: Ability to manage health-related needs will improve Outcome: Completed/Met Date Met: 02/25/16 Patient D/C's in stable condition, reviewed CHR, A-Fib S/S, home instructions, meds, activity and when to call MD or EMS and patient verbalized understanding.

## 2016-02-25 NOTE — Plan of Care (Signed)
Problem: Pain Managment: Goal: General experience of comfort will improve Outcome: Progressing Patient denies pain at this time but is aware of pain scale and will call if she needs anything for pain, will continue to monitor.

## 2016-02-25 NOTE — Plan of Care (Signed)
Problem: Fluid Volume: Goal: Ability to maintain a balanced intake and output will improve Outcome: Completed/Met Date Met: 02/25/16 All D/C instructions reviewed and copy sent home with patient. Patient went home in stable condition with no concerns.

## 2016-02-25 NOTE — Plan of Care (Signed)
Problem: Pain Managment: Goal: General experience of comfort will improve Outcome: Completed/Met Date Met: 02/25/16 Patient D/C'd pain free but aware of when to call MD or EMS, verbalized understanding. Written D/C instructions with A-Fib and CHF went home with her.

## 2016-03-02 ENCOUNTER — Telehealth: Payer: Self-pay | Admitting: *Deleted

## 2016-03-02 NOTE — Telephone Encounter (Signed)
Received 3 critical notification on monitor w/ HR of 67.1 from 03/01/16. Report stated she "passed out".  Called pt and she states she did not pass out but just feels "washed out".  Was seen in ER on 7/27 for chest pain and her Coreg was increased to 3.125 mg BID.  States Mon 7/31 she had episode of feeling light headed but was under stress due to death of an aunt and her funeral.  Durward Fortes to call if experiences any dizziness, SOB or episode of passing out. Reviewed  w/Dr. Burt Knack (DOD) who states monitor results showed sinus rhythm.

## 2016-03-15 ENCOUNTER — Other Ambulatory Visit: Payer: Self-pay | Admitting: Internal Medicine

## 2016-03-20 NOTE — Progress Notes (Signed)
Electrophysiology Office Note   Date:  03/21/2016   ID:  [name] [name], [name] [name] 05-24-1951, MRN [id number]  PCP:  Velna Hatchet, MD  Cardiologist:  Nahser Primary Electrophysiologist:  Alysse Rathe Meredith Leeds, MD    Chief Complaint  Patient presents with  . Advice Only    discuss Afib ablation     History of Present Illness: [name] [name] is a 65 y.o. female who presents today for electrophysiology evaluation.   Hx paroxysmal atrial fibrillaiton, Takotsubo, HLD. Her EF has normalized from a low of 15-20%. She was admitted to the hospital in July with chest pain, and was found to have atrial fibrillation with RVR. She had a stress test which was a low risk study. An echo at the time showed that her EF was normal. Since then, she complains of fatigue and weakness. She has been wearing a 30 day monitor which showed episodes of atrial fibrillation with RVR. She was discharged from the hospital on apixaban.   Today, she denies symptoms of palpitations, chest pain, shortness of breath, orthopnea, PND, lower extremity edema, claudication, dizziness, presyncope, syncope, bleeding, or neurologic sequela. The patient is tolerating medications without difficulties and is otherwise without complaint today.    Past Medical History:  Diagnosis Date  . Anemia    hx of years ago   . Anxiety   . Aortic valve disorders   . Arthritis   . Asthma   . Barrett esophagus   . COPD (chronic obstructive pulmonary disease) ([place])   . Depression   . Dysrhythmia    heart skips per pt   . Esophageal reflux   . Esophageal stricture   . GAD (generalized anxiety disorder)   . GERD (gastroesophageal reflux disease)   . Heart murmur   . Hiatal hernia   . History of kidney stones 01-08-13   past hx.  . Hyperlipemia   . Hypertension 12/07/2012  . Insomnia, unspecified   . Myocardial infarction [facility]) 01-08-13   '06-Chest pain-no stent-dx. MI-stress related  . Osteoporosis   . Personal history of colonic  polyps 09/2010   TUBULAR ADENOMAS (X3); NEGATIVE FOR HIGH GRADE DYSPLASIA OR MALIGNANCY.  [name] Pneumonia    hx of   . Shortness of breath    with exertion   . Stroke ([place])    hx of 2 -remains with some right sided weaknes  . Transfusion history 01-08-13   past hx. many yrs ago  . Type II or unspecified type diabetes mellitus without mention of complication, not stated as uncontrolled    on no meds   . Urinary frequency    AND INCONTINENCE   Past Surgical History:  Procedure Laterality Date  . CARDIAC CATHETERIZATION     normal per Dr Acie Fredrickson in note dated 02/06/12   . CATARACT EXTRACTION, BILATERAL    . CHOLECYSTECTOMY  2005  . ESOPHAGEAL DILATION  01-08-13   2 yrs ago  . FOOT SURGERY  2005   Left foot  . KNEE ARTHROSCOPY  02/28/2012   Procedure: ARTHROSCOPY KNEE;  Surgeon: Tobi Bastos, MD;  Location: [facility];  Service: Orthopedics;  Laterality: Left;  . PARTIAL HYSTERECTOMY    . SHOULDER SURGERY  2011  . TONSILLECTOMY    . TOTAL KNEE ARTHROPLASTY Left 01/17/2013   Procedure: LEFT TOTAL KNEE ARTHROPLASTY;  Surgeon: Tobi Bastos, MD;  Location: [facility];  Service: Orthopedics;  Laterality: Left;  . TOTAL KNEE ARTHROPLASTY Right 06/19/2014   Procedure: RIGHT TOTAL KNEE ARTHROPLASTY;  Surgeon: Tobi Bastos, MD;  Location: [facility];  Service: Orthopedics;  Laterality: Right;  . TUBAL LIGATION    . WRIST FLEXION TENDON TENOTOMIES AND PROXIMAL CORPECTOMY W/ WRIST ARTHRODESIS&ILIAC CREST BONE GRAFT       Current Outpatient Prescriptions  Medication Sig Dispense Refill  . acetaminophen (TYLENOL) 325 MG tablet Take 1-2 tablets (325-650 mg total) by mouth every 4 (four) hours as needed for mild pain.    [name] albuterol (PROVENTIL HFA;VENTOLIN HFA) 108 (90 BASE) MCG/ACT inhaler Inhale 2 puffs into the lungs every 4 (four) hours as needed for wheezing or shortness of breath. Take two puffs four times a day as needed 1 Inhaler prn  . albuterol (PROVENTIL) (2.5 MG/3ML) 0.083% nebulizer solution  Take 3 mLs (2.5 mg total) by nebulization every 6 (six) hours as needed for wheezing. DX  491.9 360 mL 12  . ALPRAZolam (XANAX) 0.5 MG tablet Take 0.5 mg by mouth 3 (three) times daily.    [name] apixaban (ELIQUIS) 5 MG TABS tablet Take 1 tablet (5 mg total) by mouth 2 (two) times daily. 60 tablet 1  . atorvastatin (LIPITOR) 40 MG tablet Take 1 tablet (40 mg total) by mouth at bedtime. 30 tablet 0  . azelastine (ASTELIN) 0.1 % nasal spray Place 1 spray into both nostrils 2 (two) times daily. Use in each nostril as directed    . benzonatate (TESSALON) 200 MG capsule TAKE 1 CAPSULE 3 TIMES DAILY AS NEEDED FOR COUGH 15 capsule 2  . calcium carbonate (OS-CAL) 600 MG TABS Take 600 mg by mouth at bedtime.     . carvedilol (COREG) 6.25 MG tablet Take 0.5 tablets (3.125 mg total) by mouth 2 (two) times daily. 60 tablet 1  . cholecalciferol (VITAMIN D) 1000 UNITS tablet Take 1,000 Units by mouth daily.    [name] dexlansoprazole (DEXILANT) 60 MG capsule Take 1 capsule (60 mg total) by mouth every morning. 30 capsule 5  . EPIPEN 2-PAK 0.3 MG/0.3ML SOAJ injection USE AS DIRECTED 1 Device 6  . ferrous sulfate 325 (65 FE) MG tablet Take 1 tablet (325 mg total) by mouth 2 (two) times daily. 60 tablet 1  . fluticasone (FLONASE) 50 MCG/ACT nasal spray Place 2 sprays into the nose daily. 16 g prn  . fluticasone furoate-vilanterol (BREO ELLIPTA) 200-25 MCG/INH AEPB Inhale 1 puff into the lungs daily. (Patient taking differently: Inhale 1 puff into the lungs daily as needed (shortness of breath). ) 1 each 0  . furosemide (LASIX) 40 MG tablet Take 40 mg by mouth daily.    [name] losartan (COZAAR) 50 MG tablet Take 1 tablet (50 mg total) by mouth daily. 90 tablet 3  . metFORMIN (GLUCOPHAGE) 500 MG tablet Take 1 tablet (500 mg total) by mouth every evening.    . montelukast (SINGULAIR) 10 MG tablet Take 1 tablet (10 mg total) by mouth at bedtime. 30 tablet 11  . PARoxetine (PAXIL) 20 MG tablet Take 20 mg by mouth at bedtime.     .  senna-docusate (SENOKOT-S) 8.6-50 MG per tablet Take 2 tablets by mouth 2 (two) times daily. (Patient taking differently: Take 2 tablets by mouth 2 (two) times daily as needed for mild constipation. ) 100 tablet 0  . vitamin B-12 (CYANOCOBALAMIN) 250 MCG tablet Take 250 mcg by mouth daily.     No current facility-administered medications for this visit.    Facility-Administered Medications Ordered in Other Visits  Medication Dose Route Frequency Provider Last Rate Last Dose  . tranexamic acid (  CYKLOKAPRON) 2,000 mg in sodium chloride 0.9 % 50 mL Topical Application  123XX123 mg Topical Once [employer], PA-C        Allergies:   Ceclor [cefaclor]; Other; Penicillins; Pneumococcal vaccines; Codeine; Cortisone; and Boniva [ibandronate sodium]   Social History:  The patient  reports that she has been smoking Cigarettes.  She has a 20.00 pack-year smoking history. She quit smokeless tobacco use about 4 years ago. Her smokeless tobacco use included Snuff and Chew. She reports that she drinks about 0.6 oz of alcohol per week . She reports that she does not use drugs.   Family History:  The patient's family history includes Breast cancer in her maternal aunt, maternal grandmother, and mother; Colon cancer (age of onset: 21) in her brother; Colon polyps in her brother; Diabetes in her maternal grandfather; Heart attack in her father; Heart failure in her brother and father; Stroke in her mother; Transient ischemic attack in her mother.    ROS:  Please see the history of present illness.   Otherwise, review of systems is positive for chest pain, SOB, palpitations, DOE, balance problems.   All other systems are reviewed and negative.    PHYSICAL EXAM: VS:  BP 124/76   Pulse (!) 49   Ht 5' 0.5" (1.537 m)   Wt 175 lb 9.6 oz (79.7 kg)   BMI 33.73 kg/m  , BMI Body mass index is 33.73 kg/m. GEN: Well nourished, well developed, in no acute distress  HEENT: normal  Neck: no JVD, carotid bruits, or  masses Cardiac: RRR; no murmurs, rubs, or gallops,no edema  Respiratory:  clear to auscultation bilaterally, normal work of breathing GI: soft, nontender, nondistended, + BS MS: no deformity or atrophy  Skin: warm and dry Neuro:  Strength and sensation are intact Psych: euthymic mood, full affect  EKG:  EKG is ordered today. Personal review of the ekg ordered shows sinus rhythm, rate 49  Recent Labs: 02/24/2016: B Natriuretic Peptide 48.6; Hemoglobin 12.4; Platelets 220 02/25/2016: BUN 9; Creatinine, Ser 0.68; Magnesium 2.2; Potassium 4.6; Sodium 138; TSH 3.919    Lipid Panel     Component Value Date/Time   CHOL 127 02/25/2016 0313   CHOL 129 12/07/2012 1445   TRIG 173 (H) 02/25/2016 0313   TRIG 147 12/07/2012 1445   HDL 25 (L) 02/25/2016 0313   HDL 40 12/07/2012 1445   CHOLHDL 5.1 02/25/2016 0313   VLDL 35 02/25/2016 0313   LDLCALC 67 02/25/2016 0313   LDLCALC 60 12/07/2012 1445     Wt Readings from Last 3 Encounters:  03/21/16 175 lb 9.6 oz (79.7 kg)  02/25/16 175 lb 4.8 oz (79.5 kg)  02/22/16 173 lb (78.5 kg)      Other studies Reviewed: Additional studies/ records that were reviewed today include: TTE 02/22/16  Review of the above records today demonstrates:  - Left ventricle: The cavity size was normal. Systolic function was   normal. The estimated ejection fraction was in the range of 55%   to 60%. Left ventricular diastolic function parameters were   normal.   ASSESSMENT AND PLAN:  1.  Paroxysmal atrial fibrillation: on Eliquis.It appears that rhythm control would be a good strategy for her and she does have symptoms of weakness and fatigue. We'll therefore start her on Multaq today. She does say that she snores, and we'll therefore get a sleep study to see if she has sleep apnea. I did discuss with him the option of ablation in the future, which  they would be amenable to medical management does not keep her in sinus rhythm.   This patients CHA2DS2-VASc Score  and unadjusted Ischemic Stroke Rate (% per year) is equal to 3.2 % stroke rate/year from a score of 3  Above score calculated as 1 point each if present [CHF, HTN, DM, Vascular=MI/PAD/Aortic Plaque, Age if 65-74, or Female] Above score calculated as 2 points each if present [Age > 75, or Stroke/TIA/TE]  2. Hypertension: well controlled today  3. 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 Her EF has improved.    Current medicines are reviewed at length with the patient today.   The patient does not have concerns regarding her medicines.  The following changes were made today:  Multaq   Labs/ tests ordered today include:  No orders of the defined types were placed in this encounter.    Disposition:   FU with Alailah Safley 3 months  Signed, Damarien Nyman Meredith Leeds, MD  03/21/2016 10:36 AM     [facility] [address] [place] [address] [phone] (office) [phone] (fax)

## 2016-03-21 ENCOUNTER — Encounter: Payer: Self-pay | Admitting: Cardiology

## 2016-03-21 ENCOUNTER — Ambulatory Visit (INDEPENDENT_AMBULATORY_CARE_PROVIDER_SITE_OTHER): Payer: Medicaid Other | Admitting: Cardiology

## 2016-03-21 VITALS — BP 124/76 | HR 49 | Ht 60.5 in | Wt 175.6 lb

## 2016-03-21 DIAGNOSIS — I480 Paroxysmal atrial fibrillation: Secondary | ICD-10-CM | POA: Diagnosis not present

## 2016-03-21 DIAGNOSIS — R0683 Snoring: Secondary | ICD-10-CM | POA: Diagnosis not present

## 2016-03-21 DIAGNOSIS — I5021 Acute systolic (congestive) heart failure: Secondary | ICD-10-CM

## 2016-03-21 DIAGNOSIS — R400 Somnolence: Secondary | ICD-10-CM

## 2016-03-21 DIAGNOSIS — G4710 Hypersomnia, unspecified: Secondary | ICD-10-CM

## 2016-03-21 MED ORDER — CARVEDILOL 6.25 MG PO TABS: 3.1250 mg | ORAL_TABLET | Freq: Two times a day (BID) | ORAL | 11 refills | Status: DC

## 2016-03-21 MED ORDER — APIXABAN 5 MG PO TABS: 5.0000 mg | ORAL_TABLET | Freq: Two times a day (BID) | ORAL | 11 refills | Status: DC

## 2016-03-21 MED ORDER — DRONEDARONE HCL 400 MG PO TABS: 400.0000 mg | ORAL_TABLET | Freq: Two times a day (BID) | ORAL | 11 refills | Status: DC

## 2016-03-21 NOTE — Patient Instructions (Addendum)
Medication Instructions:   Your physician has recommended you make the following change in your medication:       1) START Multaq 400 mg twice a day  --- If you need a refill on your cardiac medications before your next appointment, please call your pharmacy. ---  Labwork:  None ordered  Testing/Procedures: Your physician has recommended that you have a sleep study. This test records several body functions during sleep, including: brain activity, eye movement, oxygen and carbon dioxide blood levels, heart rate and rhythm, breathing rate and rhythm, the flow of air through your mouth and nose, snoring, body muscle movements, and chest and belly movement.  Follow-Up:  Your physician recommends that you schedule a follow-up appointment in: 3 months with Dr. Curt Bears.  Thank you for choosing [facility]!!   Trinidad Curet, RN [phone]   Any Other Special Instructions Will Be Listed Below (If Applicable).  Dronedarone tablets What is this medicine? DRONEDARONE (droe NE da rone) is an antiarrhythmic drug. It helps make your heart beat regularly. This medicine may be used for other purposes; ask your health care provider or pharmacist if you have questions. What should I tell my health care provider before I take this medicine? They need to know if you have any of these conditions: -heart failure -history of irregular heartbeat -liver disease -liver or lung problems with the past use of amiodarone -low levels of magnesium in the blood -low levels of potassium in the blood -other heart disease -an unusual or allergic reaction to dronedarone, other medicines, foods, dyes, or preservatives -pregnant or trying to get pregnant -breast-feeding How should I use this medicine? Take this medicine by mouth with a glass of water. Follow the directions on the prescription label. Take one tablet with the morning meal and one tablet with the evening meal. Do not take your medicine more  often than directed. Do not stop taking except on the advice of your doctor or health care professional. A special MedGuide will be given to you by the pharmacist with each prescription and refill. Be sure to read this information carefully each time. Talk to your pediatrician regarding the use of this medicine in children. Special care may be needed. Overdosage: If you think you have taken too much of this medicine contact a poison control center or emergency room at once. NOTE: This medicine is only for you. Do not share this medicine with others. What if I miss a dose? If you miss a dose, take it as soon as you can. If it is almost time for your next dose, take only that dose. Do not take double or extra doses. What may interact with this medicine? Do not take this medicine with any of the following medications: -arsenic trioxide -certain antibiotics like clarithromycin, erythromycin, pentamidine, telithromycin, troleandomycin -certain medicines for depression like tricyclic antidepressants -certain medicines for fungal infections like fluconazole, itraconazole, ketoconazole, posaconazole, voriconazole -certain medicines for irregular heart beat like amiodarone, disopyramide, dofetilide, flecainide, ibutilide, quinidine, propafenone, sotalol -certain medicines for malaria like chloroquine, halofantrine -cisapride -cyclosporine -droperidol -haloperidol -methadone -other medicines that prolong the QT interval (cause an abnormal heart rhythm) -pimozide -nefazodone -phenothiazines like chlorpromazine, mesoridazine, prochlorperazine, thioridazine -ritonavir -ziprasidone This medicine may also interact with the following medications: -certain medicines for blood pressure, heart disease, or irregular heart beat like diltiazem, metoprolol, propranolol, verapamil -certain medicines for cholesterol like atorvastatin, lovastatin, simvastatin -certain medicines for seizures like carbamazepine,  phenobarbital, phenytoin -digoxin -grapefruit juice -rifampin -sirolimus -St. John's Wort -tacrolimus  This list may not describe all possible interactions. Give your health care provider a list of all the medicines, herbs, non-prescription drugs, or dietary supplements you use. Also tell them if you smoke, drink alcohol, or use illegal drugs. Some items may interact with your medicine. What should I watch for while using this medicine? Your condition will be monitored closely when you first begin therapy. Often, this drug is first started in a hospital or other monitored health care setting. Once you are on maintenance therapy, visit your doctor or health care professional for regular checks on your progress. Because your condition and use of this medicine carry some risk, it is a good idea to carry an identification card, necklace or bracelet with details of your condition, medications, and doctor or health care professional. Dennis Bast may get drowsy or dizzy. Do not drive, use machinery, or do anything that needs mental alertness until you know how this medicine affects you. Do not stand or sit up quickly, especially if you are an older patient. This reduces the risk of dizzy or fainting spells. What side effects may I notice from receiving this medicine? Side effects that you should report to your doctor or health care professional as soon as possible: -allergic reactions like skin rash, itching or hives, swelling of the face, lips, or tongue -breathing problems -cough -dark urine -fast, irregular heartbeat -general ill feeling or flu-like symptoms -light-colored stools -loss of appetite, nausea -right upper belly pain -slow heartbeat -stomach pain -swelling of the legs or ankles -unusually weak or tired -weight gain -yellowing of the eyes or skin Side effects that usually do not require medical attention (Report these to your doctor or health care professional if they continue or are  bothersome.): -nausea -vomiting -stomach pain This list may not describe all possible side effects. Call your doctor for medical advice about side effects. You may report side effects to FDA at 1-800-FDA-1088. Where should I keep my medicine? Keep out of the reach of children. Store at room temperature between 15 and 30 degrees C (59 and 86 degrees F). Throw away any unused medicine after the expiration date. NOTE: This sheet is a summary. It may not cover all possible information. If you have questions about this medicine, talk to your doctor, pharmacist, or health care provider.    2016, Elsevier/Gold Standard. (2013-01-14 16:25:05)   Sleep Studies A sleep study (polysomnogram) is a series of tests done while you are sleeping. It can show how well you sleep. This can help your health care provider diagnose a sleep disorder and show how severe your sleep disorder is. A sleep study may lead to treatment that will help you sleep better and prevent other medical problems caused by poor sleep. If you have a sleep disorder, you may also be at risk for:   Sleep-related accidents.  High blood pressure.  Heart disease.  Stroke.  Other medical conditions. Sleep disorders are common. Your health care provider may suspect a sleep disorder if you:  Have loud snoring most nights.  Have brief periods when you stop breathing at night.  Feel sleepy on most days.  Fall asleep suddenly during the day.  Have trouble falling asleep or staying asleep.  Feel like you need to move your legs when trying to fall asleep.  Have dreams that seem very real shortly after falling asleep.  Feel like you cannot move when you first wake up. WHICH TESTS WILL I NEED TO HAVE?  Most sleep studies last all  night and include these tests:  Recordings of your brain activity.  Recordings of your eye movements.  Recording of your heart rate and rhythm.  Blood pressure readings.  Readings of the amount  of oxygen in your blood.  Measurements of your chest and belly movement as you breathe during sleep. If you have signs of the sleep disorder called sleep apnea during your test, you may get a mask to wear for the second half of the night.   The mask provides continuous positive airway pressure (CPAP). This may improve sleep apnea significantly.  You will then have all tests done again with the mask in place to see if your measurements and recordings change. HOW ARE SLEEP STUDIES DONE? Most sleep studies are done over one full night of sleep.   You will arrive at the study center in the evening and can go home in the morning.  Bring your pajamas and toothbrush.  Do not have caffeine on the day of your sleep study.  Your health care provider will let you know if you need to stop taking any of your regular medicines before the test. To do the tests included in a polysomnogram, you will have:  Round, sticky patches with sensors attached to recording wires (electrodes) placed on your scalp, face, chest, and limbs.  Wires from all the electrodes and sensors run from your bed to a computer. The wires can be taken off and put back on if you need to get out of bed to go to the bathroom.  A sensor placed over your nose to measure airflow.  A finger clip put on one finger to measure your blood oxygen level.  A belt around your belly and a belt around your chest to measure breathing movements. WHERE ARE SLEEP STUDIES DONE?  Sleep studies are done at sleep centers. A sleep center may be inside a hospital, office, or clinic.  The room where you have the study may look like a hospital room or a hotel room. The health care providers doing the study may come in and out of the room during the study. Most of the time, they will be in another room monitoring your test.  HOW IS INFORMATION FROM SLEEP STUDIES HELPFUL? A polysomnogram can be used along with your medical history and a physical exam to  diagnose conditions, such as:  Sleep apnea.  Restless legs syndrome.  Sleep-related seizure disorders.  Sleep-related movement disorders. A medical doctor who specializes in sleep will evaluate your sleep study. The specialist will share the results with your primary health care provider. Treatments based on your sleep study may include:  Improving your sleep habits (sleep hygiene).  Wearing a CPAP mask.  Wearing an oral device at night to improve breathing and reduce snoring.  Taking medicine for:  Restless legs syndrome.  Sleep-related seizure disorder.  Sleep-related movement disorder.   This information is not intended to replace advice given to you by your health care provider. Make sure you discuss any questions you have with your health care provider.   Document Released: 01/22/2003 Document Revised: 08/08/2014 Document Reviewed: 09/23/2013 Elsevier Interactive Patient Education 2016 Elsevier Inc.    Cardiac Ablation Cardiac ablation is a procedure to disable a small amount of heart tissue in very specific places. The heart has many electrical connections. Sometimes these connections are abnormal and can cause the heart to beat very fast or irregularly. By disabling some of the problem areas, heart rhythm can be improved or made normal.  Ablation is done for people who:   Have Wolff-Parkinson-White syndrome.   Have other fast heart rhythms (tachycardia).   Have taken medicines for an abnormal heart rhythm (arrhythmia) that resulted in:   No success.   Side effects.   May have a high-risk heartbeat that could result in death.  LET [facility] CARE PROVIDER KNOW ABOUT:   Any allergies you have or any previous reactions you have had to X-ray dye, food (such as seafood), medicine, or tape.   All medicines you are taking, including vitamins, herbs, eye drops, creams, and over-the-counter medicines.   Previous problems you or members of your family have  had with the use of anesthetics.   Any blood disorders you have.   Previous surgeries or procedures (such as a kidney transplant) you have had.   Medical conditions you have (such as kidney failure).  RISKS AND COMPLICATIONS Generally, cardiac ablation is a safe procedure. However, problems can occur and include:   Increased risk of cancer. Depending on how long it takes to do the ablation, the dose of radiation can be high.  Bruising and bleeding where a thin, flexible tube (catheter) was inserted during the procedure.   Bleeding into the chest, especially into the sac that surrounds the heart (serious).  Need for a permanent pacemaker if the normal electrical system is damaged.   The procedure may not be fully effective, and this may not be recognized for months. Repeat ablation procedures are sometimes required. BEFORE THE PROCEDURE   Follow any instructions from your health care provider regarding eating and drinking before the procedure.   Take your medicines as directed at regular times with water, unless instructed otherwise by your health care provider. If you are taking diabetes medicine, including insulin, ask how you are to take it and if there are any special instructions you should follow. It is common to adjust insulin dosing the day of the ablation.  PROCEDURE  An ablation is usually performed in a catheterization laboratory with the guidance of fluoroscopy. Fluoroscopy is a type of X-ray that helps your health care provider see images of your heart during the procedure.   An ablation is a minimally invasive procedure. This means a small cut (incision) is made in either your neck or groin. Your health care provider will decide where to make the incision based on your medical history and physical exam.  An IV tube will be started before the procedure begins. You will be given an anesthetic or medicine to help you relax (sedative).  The skin on your neck or  groin will be numbed. A needle will be inserted into a large vein in your neck or groin and catheters will be threaded to your heart.  A special dye that shows up on fluoroscopy pictures may be injected through the catheter. The dye helps your health care provider see the area of the heart that needs treatment.  The catheter has electrodes on the tip. When the area of heart tissue that is causing the arrhythmia is found, the catheter tip will send an electrical current to the area and "scar" the tissue. Three types of energy can be used to ablate the heart tissue:   Heat (radiofrequency energy).   Laser energy.   Extreme cold (cryoablation).   When the area of the heart has been ablated, the catheter will be taken out. Pressure will be held on the insertion site. This will help the insertion site clot and keep it from bleeding.  A bandage will be placed on the insertion site.  AFTER THE PROCEDURE   After the procedure, you will be taken to a recovery area where your vital signs (blood pressure, heart rate, and breathing) will be monitored. The insertion site will also be monitored for bleeding.   You will need to lie still for 4-6 hours. This is to ensure you do not bleed from the catheter insertion site.    This information is not intended to replace advice given to you by your health care provider. Make sure you discuss any questions you have with your health care provider.   Document Released: 12/04/2008 Document Revised: 08/08/2014 Document Reviewed: 12/10/2012 Elsevier Interactive Patient Education [employer].

## 2016-03-21 NOTE — Addendum Note (Signed)
Addended by: Stanton Kidney on: 03/21/2016 10:55 AM   Modules accepted: Orders

## 2016-03-22 ENCOUNTER — Telehealth: Payer: Self-pay

## 2016-03-22 NOTE — Telephone Encounter (Signed)
Multaq is not covered by Medicaid. She must try and fail 2 other anti-arrythmics before approval.

## 2016-03-23 NOTE — Telephone Encounter (Signed)
Lets start flecainide 75 mg BID.

## 2016-03-24 ENCOUNTER — Telehealth: Payer: Self-pay | Admitting: Cardiology

## 2016-03-24 MED ORDER — FLECAINIDE ACETATE 50 MG PO TABS: 75.0000 mg | ORAL_TABLET | Freq: Two times a day (BID) | ORAL | 6 refills | Status: DC

## 2016-03-24 NOTE — Telephone Encounter (Signed)
Follow up  Pt voiced she is returning nurses call possibly regarding medication or sleep study.  Please follow up with pt. Thanks!

## 2016-03-24 NOTE — Telephone Encounter (Signed)
Advised patient of physician's recommendation. Patient is agreeable. She will have a follow up EKG on on 9/1 when she is in office seeing Dr. Acie Fredrickson. (will send his nurse a note)

## 2016-03-24 NOTE — Telephone Encounter (Deleted)
error 

## 2016-03-27 ENCOUNTER — Emergency Department (HOSPITAL_COMMUNITY)
Admission: EM | Admit: 2016-03-27 | Discharge: 2016-03-27 | Disposition: A | Discharge status: Home / Self Care | Payer: Medicaid Other | Attending: Emergency Medicine | Admitting: Emergency Medicine

## 2016-03-27 ENCOUNTER — Encounter (HOSPITAL_COMMUNITY): Payer: Self-pay | Admitting: Emergency Medicine

## 2016-03-27 ENCOUNTER — Telehealth: Payer: Self-pay | Admitting: Physician Assistant

## 2016-03-27 ENCOUNTER — Emergency Department (HOSPITAL_COMMUNITY): Payer: Medicaid Other

## 2016-03-27 DIAGNOSIS — Z7984 Long term (current) use of oral hypoglycemic drugs: Secondary | ICD-10-CM | POA: Insufficient documentation

## 2016-03-27 DIAGNOSIS — I252 Old myocardial infarction: Secondary | ICD-10-CM | POA: Diagnosis not present

## 2016-03-27 DIAGNOSIS — J449 Chronic obstructive pulmonary disease, unspecified: Secondary | ICD-10-CM | POA: Insufficient documentation

## 2016-03-27 DIAGNOSIS — I5021 Acute systolic (congestive) heart failure: Secondary | ICD-10-CM | POA: Insufficient documentation

## 2016-03-27 DIAGNOSIS — Z96653 Presence of artificial knee joint, bilateral: Secondary | ICD-10-CM | POA: Insufficient documentation

## 2016-03-27 DIAGNOSIS — E119 Type 2 diabetes mellitus without complications: Secondary | ICD-10-CM | POA: Diagnosis not present

## 2016-03-27 DIAGNOSIS — Z8673 Personal history of transient ischemic attack (TIA), and cerebral infarction without residual deficits: Secondary | ICD-10-CM | POA: Diagnosis not present

## 2016-03-27 DIAGNOSIS — Z7901 Long term (current) use of anticoagulants: Secondary | ICD-10-CM | POA: Insufficient documentation

## 2016-03-27 DIAGNOSIS — R1032 Left lower quadrant pain: Secondary | ICD-10-CM | POA: Diagnosis present

## 2016-03-27 DIAGNOSIS — F17210 Nicotine dependence, cigarettes, uncomplicated: Secondary | ICD-10-CM | POA: Insufficient documentation

## 2016-03-27 DIAGNOSIS — I110 Hypertensive heart disease with heart failure: Secondary | ICD-10-CM | POA: Diagnosis not present

## 2016-03-27 DIAGNOSIS — K529 Noninfective gastroenteritis and colitis, unspecified: Secondary | ICD-10-CM | POA: Insufficient documentation

## 2016-03-27 DIAGNOSIS — N939 Abnormal uterine and vaginal bleeding, unspecified: Secondary | ICD-10-CM | POA: Insufficient documentation

## 2016-03-27 LAB — URINALYSIS, ROUTINE W REFLEX MICROSCOPIC
Bilirubin Urine: NEGATIVE
Glucose, UA: NEGATIVE mg/dL
Ketones, ur: NEGATIVE mg/dL
Leukocytes, UA: NEGATIVE
NITRITE: NEGATIVE
PROTEIN: NEGATIVE mg/dL
pH: 6 (ref 5.0–8.0)

## 2016-03-27 LAB — TYPE AND SCREEN
ABO/RH(D): O POS
Antibody Screen: NEGATIVE

## 2016-03-27 LAB — COMPREHENSIVE METABOLIC PANEL
ALBUMIN: 3.6 g/dL (ref 3.5–5.0)
ALK PHOS: 89 U/L (ref 38–126)
ALT: 28 U/L (ref 14–54)
ANION GAP: 8 (ref 5–15)
AST: 21 U/L (ref 15–41)
BUN: <5 mg/dL — ABNORMAL LOW (ref 6–20)
CHLORIDE: 105 mmol/L (ref 101–111)
CO2: 26 mmol/L (ref 22–32)
Calcium: 8.9 mg/dL (ref 8.9–10.3)
Creatinine, Ser: 0.67 mg/dL (ref 0.44–1.00)
GFR calc Af Amer: >60 mL/min (ref >60)
GFR calc non Af Amer: >60 mL/min (ref >60)
GLUCOSE: 174 mg/dL — AB (ref 65–99)
POTASSIUM: 3.1 mmol/L — AB (ref 3.5–5.1)
SODIUM: 139 mmol/L (ref 135–145)
Total Bilirubin: 0.8 mg/dL (ref 0.3–1.2)
Total Protein: 6.3 g/dL — ABNORMAL LOW (ref 6.5–8.1)

## 2016-03-27 LAB — CBC
HEMATOCRIT: 36.2 % (ref 36.0–46.0)
HEMOGLOBIN: 11.6 g/dL — AB (ref 12.0–15.0)
MCH: 29.4 pg (ref 26.0–34.0)
MCHC: 32 g/dL (ref 30.0–36.0)
MCV: 91.9 fL (ref 78.0–100.0)
Platelets: 225 10*3/uL (ref 150–400)
RBC: 3.94 MIL/uL (ref 3.87–5.11)
RDW: 14.3 % (ref 11.5–15.5)
WBC: 6 10*3/uL (ref 4.0–10.5)

## 2016-03-27 LAB — URINE MICROSCOPIC-ADD ON

## 2016-03-27 LAB — POC OCCULT BLOOD, ED: FECAL OCCULT BLD: NEGATIVE

## 2016-03-27 LAB — ABO/RH: ABO/RH(D): O POS

## 2016-03-27 MED ORDER — CIPROFLOXACIN HCL 500 MG PO TABS: 500.0000 mg | ORAL_TABLET | Freq: Two times a day (BID) | ORAL | 0 refills | Status: DC

## 2016-03-27 MED ORDER — SODIUM CHLORIDE 0.9 % IV BOLUS (SEPSIS)
500.0000 mL | Freq: Once | INTRAVENOUS | Status: AC
  Administered 2016-03-27: 500 mL via INTRAVENOUS

## 2016-03-27 MED ORDER — POTASSIUM CHLORIDE CRYS ER 20 MEQ PO TBCR
40.0000 meq | EXTENDED_RELEASE_TABLET | Freq: Once | ORAL | Status: AC
  Administered 2016-03-27: 40 meq via ORAL
  Filled 2016-03-27: qty 2

## 2016-03-27 MED ORDER — METRONIDAZOLE 500 MG PO TABS: 500.0000 mg | ORAL_TABLET | Freq: Three times a day (TID) | ORAL | 0 refills | Status: DC

## 2016-03-27 MED ORDER — IOPAMIDOL (ISOVUE-300) INJECTION 61%
INTRAVENOUS | Status: AC
Start: 2016-03-27 — End: 2016-03-27
  Administered 2016-03-27: 100 mL
  Filled 2016-03-27: qty 100

## 2016-03-27 NOTE — ED Triage Notes (Signed)
Pt states at 0830 this morning she had a sudden onset of vaginal bleeding. Pt is on Eliqus for aifb. Pt has saturated 2 pads in the last 3 hours. Pt is warm dry, alert and ox3.

## 2016-03-27 NOTE — Discharge Instructions (Signed)
Follow-up closely with her primary doctor. If your pain is mild and no further bleeding along with no fevers he can hold antibiotics. Otherwise start the antibiotics and discuss further with her primary doctor.  If you were given medicines take as directed.  If you are on coumadin or contraceptives realize their levels and effectiveness is altered by many different medicines.  If you have any reaction (rash, tongues swelling, other) to the medicines stop taking and see a physician.    If your blood pressure was elevated in the ER make sure you follow up for management with a primary doctor or return for chest pain, shortness of breath or stroke symptoms.  Please follow up as directed and return to the ER or see a physician for new or worsening symptoms.  Thank you. Vitals:   03/27/16 1126 03/27/16 1129 03/27/16 1352  BP: (!) 130/112  (!) 117/51  Pulse: (!) 52  (!) 51  Resp: 18  16  Temp: 97.7 F (36.5 C)  97.5 F (36.4 C)  TempSrc: Oral  Oral  SpO2: 96%    Weight:  175 lb (79.4 kg)   Height:  5\' 1"  (1.549 m)

## 2016-03-27 NOTE — Telephone Encounter (Signed)
Ms. Schermerhorn called because she had bright red blood today in her urine. She was concerned about this. She has not had any bleeding problems before. She is not having any dysuria or other symptoms of acute illness.  -5*she needed to get this evaluated. She should go to the weekend healthcare option of her choice and get it evaluated and if we needed to stop the Eliquis then that should be done. She was in agreement with this plan.  Lenoard Aden 03/27/2016 9:35 AM Beeper [phone]

## 2016-03-27 NOTE — ED Provider Notes (Signed)
[facility] DEPT Provider Note   CSN: [id number] Arrival date & time: 03/27/16  1113     History   Chief Complaint Chief Complaint  Patient presents with  . Vaginal Bleeding    HPI [name] [name] is a 65 y.o. female.  Patient with history of COPD, lipids, cardiac history on Xarelto presents with bleeding episode. Patient had significant amount of blood unsure if it was vaginal versus GI source. No history of similar. Bleeding stopped after one episode. Patient's had mild left lower quadrant pain for the past 2 days. No fevers mild chills. No urinary symptoms. No syncope. Patient had partial hysterectomy in the past. No history of cancer. Patient has right her Pap smears.    Vaginal Bleeding  Primary symptoms include vaginal bleeding.  Primary symptoms include no dysuria. Associated symptoms include abdominal pain. Pertinent negatives include no vomiting and no light-headedness.    Past Medical History:  Diagnosis Date  . Anemia    hx of years ago   . Anxiety   . Aortic valve disorders   . Arthritis   . Asthma   . Barrett esophagus   . COPD (chronic obstructive pulmonary disease) ([place])   . Depression   . Dysrhythmia    heart skips per pt   . Esophageal reflux   . Esophageal stricture   . GAD (generalized anxiety disorder)   . GERD (gastroesophageal reflux disease)   . Heart murmur   . Hiatal hernia   . History of kidney stones 01-08-13   past hx.  . Hyperlipemia   . Hypertension 12/07/2012  . Insomnia, unspecified   . Myocardial infarction [facility]) 01-08-13   '06-Chest pain-no stent-dx. MI-stress related  . Osteoporosis   . Personal history of colonic polyps 09/2010   TUBULAR ADENOMAS (X3); NEGATIVE FOR HIGH GRADE DYSPLASIA OR MALIGNANCY.  [name] Pneumonia    hx of   . Shortness of breath    with exertion   . Stroke ([place])    hx of 2 -remains with some right sided weaknes  . Transfusion history 01-08-13   past hx. many yrs ago  . Type II or unspecified type  diabetes mellitus without mention of complication, not stated as uncontrolled    on no meds   . Urinary frequency    AND INCONTINENCE    Patient Active Problem List   Diagnosis Date Noted  . Hypokalemia 02/25/2016  . Anxiety 02/24/2016  . Stroke-like symptoms 02/24/2016  . Weakness 01/26/2016  . Atrial fibrillation ([place]) 01/26/2016  . History of colon polyps 01/28/2015  . Dysphagia 01/28/2015  . History of esophageal stricture 01/28/2015  . Primary osteoarthritis of right knee 06/24/2014  . Acute systolic heart failure ([place]) 06/23/2014  . Takotsubo syndrome 06/21/2014  . Elevated troponin 06/20/2014  . Atrial fibrillation with RVR ([place]) 06/20/2014  . Congestive dilated cardiomyopathy ([place]) 06/20/2014  . Demand ischemia of myocardium ([place]) 06/20/2014  . 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 Respiratory failure requiring intubation ([place]) 06/19/2014  . Acute pulmonary edema ([place]) 06/19/2014  . Acute respiratory failure with hypoxia ([place]) 06/19/2014  . Hypoxemia   . Arterial hypotension   . Gait instability 10/26/2013  . Stroke ([place]) 10/26/2013  . TIA (transient ischemic attack) 10/26/2013  . Tobacco user, hx of 05/20/2013  . Postoperative anemia due to acute blood loss 01/18/2013  . Osteoarthritis of left knee 01/17/2013  . Essential hypertension 12/07/2012  . Peripheral edema 12/07/2012  . Diabetes mellitus with complication ([place]) [id number]  .  Chronic bronchitis with acute exacerbation ([place]) 08/05/2012  . Seasonal and perennial allergic rhinitis 08/05/2012  . Urticaria 08/05/2012  . Chest pain 02/06/2012  . Hyperlipidemia 02/06/2012    Past Surgical History:  Procedure Laterality Date  . CARDIAC CATHETERIZATION     normal per Dr Acie Fredrickson in note dated 02/06/12   . CATARACT EXTRACTION, BILATERAL    . CHOLECYSTECTOMY  2005  . ESOPHAGEAL DILATION  01-08-13   2 yrs ago  . FOOT SURGERY  2005   Left foot  . KNEE ARTHROSCOPY  02/28/2012   Procedure:  ARTHROSCOPY KNEE;  Surgeon: Tobi Bastos, MD;  Location: [facility] ORS;  Service: Orthopedics;  Laterality: Left;  . PARTIAL HYSTERECTOMY    . SHOULDER SURGERY  2011  . TONSILLECTOMY    . TOTAL KNEE ARTHROPLASTY Left 01/17/2013   Procedure: LEFT TOTAL KNEE ARTHROPLASTY;  Surgeon: Tobi Bastos, MD;  Location: [facility] ORS;  Service: Orthopedics;  Laterality: Left;  . TOTAL KNEE ARTHROPLASTY Right 06/19/2014   Procedure: RIGHT TOTAL KNEE ARTHROPLASTY;  Surgeon: Tobi Bastos, MD;  Location: [facility];  Service: Orthopedics;  Laterality: Right;  . TUBAL LIGATION    . WRIST FLEXION TENDON TENOTOMIES AND PROXIMAL CORPECTOMY W/ WRIST ARTHRODESIS&ILIAC CREST BONE GRAFT      OB History    No data available       Home Medications    Prior to Admission medications   Medication Sig Start Date End Date Taking? Authorizing Provider  acetaminophen (TYLENOL) 325 MG tablet Take 1-2 tablets (325-650 mg total) by mouth every 4 (four) hours as needed for mild pain. 06/27/14  Yes Ivan Anchors Love, PA-C  albuterol (PROVENTIL HFA;VENTOLIN HFA) 108 (90 BASE) MCG/ACT inhaler Inhale 2 puffs into the lungs every 4 (four) hours as needed for wheezing or shortness of breath. Take two puffs four times a day as needed 05/08/15 06/28/16 Yes Clinton D Young, MD  albuterol (PROVENTIL) (2.5 MG/3ML) 0.083% nebulizer solution Take 3 mLs (2.5 mg total) by nebulization every 6 (six) hours as needed for wheezing. DX  491.9 01/08/15  Yes Deneise Lever, MD  ALPRAZolam Duanne Moron) 0.5 MG tablet Take 0.5 mg by mouth 3 (three) times daily.   Yes Historical Provider, MD  apixaban (ELIQUIS) 5 MG TABS tablet Take 1 tablet (5 mg total) by mouth 2 (two) times daily. 03/21/16  Yes Will Meredith Leeds, MD  atorvastatin (LIPITOR) 40 MG tablet Take 1 tablet (40 mg total) by mouth at bedtime. 07/09/13  Yes Mary-Margaret Hassell Done, FNP  azelastine (ASTELIN) 0.1 % nasal spray Place 1 spray into both nostrils 2 (two) times daily. Use in each nostril as directed    Yes Historical Provider, MD  benzonatate (TESSALON) 200 MG capsule TAKE 1 CAPSULE 3 TIMES DAILY AS NEEDED FOR COUGH 12/31/15  Yes Deneise Lever, MD  calcium carbonate (OS-CAL) 600 MG TABS Take 600 mg by mouth at bedtime.    Yes Historical Provider, MD  carvedilol (COREG) 6.25 MG tablet Take 0.5 tablets (3.125 mg total) by mouth 2 (two) times daily. 03/21/16  Yes Will Meredith Leeds, MD  cholecalciferol (VITAMIN D) 1000 UNITS tablet Take 1,000 Units by mouth at bedtime.    Yes Historical Provider, MD  dexlansoprazole (DEXILANT) 60 MG capsule Take 1 capsule (60 mg total) by mouth every morning. 12/07/12  Yes Mary-Margaret Hassell Done, FNP  EPIPEN 2-PAK 0.3 MG/0.3ML SOAJ injection USE AS DIRECTED 03/15/16  Yes Deneise Lever, MD  ferrous sulfate 325 (65 FE) MG tablet Take 1 tablet (  325 mg total) by mouth 2 (two) times daily. 06/27/14  Yes Ivan Anchors Love, PA-C  flecainide (TAMBOCOR) 50 MG tablet Take 1.5 tablets (75 mg total) by mouth 2 (two) times daily. 03/24/16  Yes Will Meredith Leeds, MD  fluticasone (FLONASE) 50 MCG/ACT nasal spray Place 2 sprays into the nose daily. 05/07/13  Yes Deneise Lever, MD  fluticasone furoate-vilanterol (BREO ELLIPTA) 200-25 MCG/INH AEPB Inhale 1 puff into the lungs daily. Patient taking differently: Inhale 1 puff into the lungs daily as needed (shortness of breath).  11/06/15  Yes Deneise Lever, MD  furosemide (LASIX) 40 MG tablet Take 40 mg by mouth daily.   Yes Historical Provider, MD  losartan (COZAAR) 50 MG tablet Take 1 tablet (50 mg total) by mouth daily. Patient taking differently: Take 50 mg by mouth at bedtime.  07/03/14  Yes Thayer Headings, MD  metFORMIN (GLUCOPHAGE) 500 MG tablet Take 1 tablet (500 mg total) by mouth every evening. 02/25/16  Yes Christina P Rama, MD  montelukast (SINGULAIR) 10 MG tablet Take 1 tablet (10 mg total) by mouth at bedtime. Patient taking differently: Take 10 mg by mouth every morning.  05/15/14  Yes Deneise Lever, MD  PARoxetine (PAXIL)  20 MG tablet Take 20 mg by mouth at bedtime.    Yes Historical Provider, MD  vitamin B-12 (CYANOCOBALAMIN) 250 MCG tablet Take 250 mcg by mouth at bedtime.    Yes Historical Provider, MD  ciprofloxacin (CIPRO) 500 MG tablet Take 1 tablet (500 mg total) by mouth 2 (two) times daily. One po bid x 7 days 03/27/16   Elnora Morrison, MD  metroNIDAZOLE (FLAGYL) 500 MG tablet Take 1 tablet (500 mg total) by mouth 3 (three) times daily. One po bid x 7 days 03/27/16   Elnora Morrison, MD  senna-docusate (SENOKOT-S) 8.6-50 MG per tablet Take 2 tablets by mouth 2 (two) times daily. Patient not taking: Reported on 03/27/2016 06/27/14   Bary Leriche, PA-C    Family History Family History  Problem Relation Age of Onset  . Breast cancer Mother   . Stroke Mother   . Transient ischemic attack Mother   . Colon cancer Brother 51  . Colon polyps Brother   . Heart failure Brother   . Heart attack Father   . Heart failure Father   . Diabetes Maternal Grandfather   . Kidney disease      Both sides of family  . Heart disease      Both sides of family  . Breast cancer Maternal Aunt   . Breast cancer Maternal Grandmother     Social History Social History  Substance Use Topics  . Smoking status: Current Every Day Smoker    Packs/day: 0.50    Years: 40.00    Types: Cigarettes    Last attempt to quit: 06/24/2013  . Smokeless tobacco: Former Systems developer    Types: Snuff, Chew    Quit date: 12/31/2011  . Alcohol use 0.6 oz/week    1 Cans of beer per week     Comment: occasionally     Allergies   Bee venom; Ceclor [cefaclor]; Penicillins; Pneumococcal vaccines; Codeine; Cortisone; and Boniva [ibandronate sodium]   Review of Systems Review of Systems  Constitutional: Negative for chills and fever.  HENT: Negative for congestion.   Eyes: Negative for visual disturbance.  Respiratory: Negative for shortness of breath.   Cardiovascular: Negative for chest pain.  Gastrointestinal: Positive for abdominal pain.  Negative for vomiting.  Genitourinary:  Positive for vaginal bleeding. Negative for dysuria and flank pain.  Musculoskeletal: Negative for back pain, neck pain and neck stiffness.  Skin: Negative for rash.  Neurological: Negative for light-headedness and headaches.     Physical Exam Updated Vital Signs BP (!) 117/51 (BP Location: Right Arm)   Pulse (!) 51   Temp 97.5 F (36.4 C) (Oral)   Resp 16   Ht 5\' 1"  (1.549 m)   Wt 175 lb (79.4 kg)   SpO2 96%   BMI 33.07 kg/m   Physical Exam  Constitutional: She is oriented to person, place, and time. She appears well-developed and well-nourished.  HENT:  Head: Normocephalic and atraumatic.  Eyes: Conjunctivae are normal. Right eye exhibits no discharge. Left eye exhibits no discharge.  Neck: Normal range of motion. Neck supple. No tracheal deviation present.  Cardiovascular: Regular rhythm.  Bradycardia present.   Pulmonary/Chest: Effort normal and breath sounds normal.  Abdominal: Soft. She exhibits no distension. There is tenderness (mild left lower quadrant). There is no guarding.  Genitourinary:  Genitourinary Comments: No vaginal bleeding or discharge, brown stool on rectal testing normal tone  Musculoskeletal: She exhibits no edema.  Neurological: She is alert and oriented to person, place, and time.  Skin: Skin is warm. No rash noted.  Psychiatric: She has a normal mood and affect.  Nursing note and vitals reviewed.    ED Treatments / Results  Labs (all labs ordered are listed, but only abnormal results are displayed) Labs Reviewed  COMPREHENSIVE METABOLIC PANEL - Abnormal; Notable for the following:       Result Value   Potassium 3.1 (*)    Glucose, Bld 174 (*)    BUN <5 (*)    Total Protein 6.3 (*)    All other components within normal limits  CBC - Abnormal; Notable for the following:    Hemoglobin 11.6 (*)    All other components within normal limits  URINALYSIS, ROUTINE W REFLEX MICROSCOPIC (NOT AT [facility])  POC  OCCULT BLOOD, ED  TYPE AND SCREEN  ABO/RH    EKG  EKG Interpretation None       Radiology Ct Abdomen Pelvis W Contrast  Result Date: 03/27/2016 CLINICAL DATA:  Left lower quadrant pain.  Right arm pain. EXAM: CT ABDOMEN AND PELVIS WITH CONTRAST TECHNIQUE: Multidetector CT imaging of the abdomen and pelvis was performed using the standard protocol following bolus administration of intravenous contrast. CONTRAST:  174mL ISOVUE-300 IOPAMIDOL (ISOVUE-300) INJECTION 61% COMPARISON:  None. FINDINGS: Lower chest:  Lung bases are clear.  Normal heart size. Hepatobiliary: Normal liver.  Prior cholecystectomy. Pancreas: Normal. Spleen: Normal. Adrenals/Urinary Tract: Normal adrenal glands. Normal kidneys. No urolithiasis or obstructive uropathy. Normal bladder. Stomach/Bowel: No bowel dilatation. Mild bowel wall thickening and pericolonic inflammatory changes of the distal descending colon concerning for mild colitis. No pneumatosis, pneumoperitoneum or portal venous gas. No abdominal or pelvic free fluid. Vascular/Lymphatic: Normal caliber abdominal aorta with atherosclerosis. No lymphadenopathy. Reproductive: No adnexal mass.  Prior hysterectomy. Other: No fluid collection or hematoma. Musculoskeletal: No aggressive lytic or sclerotic osseous lesion. Mild osteoarthritis of bilateral sacroiliac joints. IMPRESSION: 1. Mild bowel wall thickening and pericolonic inflammatory changes of the distal descending colon concerning for mild colitis secondary to an infectious or inflammatory etiology. 2. Electronically Signed   By: Kathreen Devoid   On: 03/27/2016 17:35    Procedures Procedures (including critical care time)  Medications Ordered in ED Medications  potassium chloride SA (K-DUR,KLOR-CON) CR tablet 40 mEq (40 mEq Oral Given  03/27/16 1540)  sodium chloride 0.9 % bolus 500 mL (0 mLs Intravenous Stopped 03/27/16 1810)  iopamidol (ISOVUE-300) 61 % injection (100 mLs  Contrast Given 03/27/16 1658)      Initial Impression / Assessment and Plan / ED Course  I have reviewed the triage vital signs and the nursing notes.  Pertinent labs & imaging results that were available during my care of the patient were reviewed by me and considered in my medical decision making (see chart for details).  Clinical Course   Patient presents after episode of bleeding vaginal versus GI source. With left lower quadrant pain and significant episode of bleeding plan for CT scan for further delineation to look for signs of diverticulitis versus tumor versus other. Patient okay with this plan.  CT mild colitis.  Plan for watch and wait before starting abx, scripts given.  Results and differential diagnosis were discussed with the patient/parent/guardian. Xrays were independently reviewed by myself.  Close follow up outpatient was discussed, comfortable with the plan.   Medications  potassium chloride SA (K-DUR,KLOR-CON) CR tablet 40 mEq (40 mEq Oral Given 03/27/16 1540)  sodium chloride 0.9 % bolus 500 mL (0 mLs Intravenous Stopped 03/27/16 1810)  iopamidol (ISOVUE-300) 61 % injection (100 mLs  Contrast Given 03/27/16 1658)    Vitals:   03/27/16 1126 03/27/16 1129 03/27/16 1352  BP: (!) 130/112  (!) 117/51  Pulse: (!) 52  (!) 51  Resp: 18  16  Temp: 97.7 F (36.5 C)  97.5 F (36.4 C)  TempSrc: Oral  Oral  SpO2: 96%    Weight:  175 lb (79.4 kg)   Height:  5\' 1"  (1.549 m)     Final diagnoses:  Vaginal bleeding  Left lower quadrant pain  Colitis     Final Clinical Impressions(s) / ED Diagnoses   Final diagnoses:  Vaginal bleeding  Left lower quadrant pain  Colitis    New Prescriptions New Prescriptions   CIPROFLOXACIN (CIPRO) 500 MG TABLET    Take 1 tablet (500 mg total) by mouth 2 (two) times daily. One po bid x 7 days   METRONIDAZOLE (FLAGYL) 500 MG TABLET    Take 1 tablet (500 mg total) by mouth 3 (three) times daily. One po bid x 7 days     Elnora Morrison, MD 03/27/16 [phone]

## 2016-04-01 ENCOUNTER — Encounter: Payer: Self-pay | Admitting: Cardiovascular Disease

## 2016-04-01 ENCOUNTER — Encounter (INDEPENDENT_AMBULATORY_CARE_PROVIDER_SITE_OTHER): Payer: Self-pay

## 2016-04-01 ENCOUNTER — Ambulatory Visit (INDEPENDENT_AMBULATORY_CARE_PROVIDER_SITE_OTHER): Payer: Medicaid Other | Admitting: Cardiovascular Disease

## 2016-04-01 VITALS — BP 120/80 | HR 51 | Ht 61.0 in | Wt 177.0 lb

## 2016-04-01 DIAGNOSIS — I480 Paroxysmal atrial fibrillation: Secondary | ICD-10-CM | POA: Diagnosis not present

## 2016-04-01 DIAGNOSIS — I5021 Acute systolic (congestive) heart failure: Secondary | ICD-10-CM

## 2016-04-01 MED ORDER — APIXABAN 5 MG PO TABS: 5.0000 mg | ORAL_TABLET | Freq: Two times a day (BID) | ORAL | 11 refills | Status: DC

## 2016-04-01 NOTE — Progress Notes (Signed)
Cardiology Office Note   Date:  04/01/2016   ID:  [name] [name], DOB [birth year], MRN [id number]  PCP:  Velna Hatchet, MD  Cardiologist:   Mertie Moores, MD   Chief Complaint  Patient presents with  . Atrial Fibrillation   1. Takotsubo Syndrome  2. Normal heart catheterization 2006 3. Hyperlipidemia 4. Left knee pain 5. Paroxysmal atrial fibrillation ( CHADS2VASC = 3 female, HTN, CHF )     History of Present Illness: [name] [name] is a 65 y.o. female who presents for follow up of her Takotsubo syndrome.   At developed acute systolic congestive heart failure following her leg surgery in Nov, 2015.  [name] She apparently had diffuse chronic spasm resulting in Takotsubo syndrome. Her EF was low as 15-20%. She has improved significantly and now her EF has normallized  She has maintained sinus rhythm.  We started  her on carvedilol 3.125 mg twice a day and losartan 50 mg a day in Dec. 2015.  [name]  She has done very well.   No dyspnea , no CP .   March 23, 2015:  Doing well - had a takotsubo syndrome in Nov. 2015 following knee surgery   Occasional CP with activity  She has had a cath  Feb. 27, 2017:  Has hoarse voice today .  Either allergies or URI  No CP ,  Does have some chest tightness when she breaths    January 26, 2016: [name] [name] is seen for work in visit.  Has episodes of tachycardia followed by    chest  tightness .  Worse with exertion .    Episodes last for several hours.  Better if she is able to get into the house and sit down.    Feels like her heart is going to beat our of her chest.  Has a hx of PAF in the past   04/01/2016:    [name] [name]  had an event monitor placed after last visit. It verified that she is having episodes of paroxysmal atrial fibrillation. She was on Eliquis but she stopped it due to [employer].   She wants to go ahead and get an ablation  She's been tried on Multaq and Flecainide .  She had PAF on her event monitor    Past  Medical History:  Diagnosis Date  . Anemia    hx of years ago   . Anxiety   . Aortic valve disorders   . Arthritis   . Asthma   . Barrett esophagus   . COPD (chronic obstructive pulmonary disease) ([place])   . Depression   . Dysrhythmia    heart skips per pt   . Esophageal reflux   . Esophageal stricture   . GAD (generalized anxiety disorder)   . GERD (gastroesophageal reflux disease)   . Heart murmur   . Hiatal hernia   . History of kidney stones 01-08-13   past hx.  . Hyperlipemia   . Hypertension 12/07/2012  . Insomnia, unspecified   . Myocardial infarction [facility]) 01-08-13   '06-Chest pain-no stent-dx. MI-stress related  . Osteoporosis   . Personal history of colonic polyps 09/2010   TUBULAR ADENOMAS (X3); NEGATIVE FOR HIGH GRADE DYSPLASIA OR MALIGNANCY.  [name] Pneumonia    hx of   . Shortness of breath    with exertion   . Stroke ([place])    hx of 2 -remains with some right sided weaknes  . Transfusion history 01-08-13   past hx. many yrs ago  .  Type II or unspecified type diabetes mellitus without mention of complication, not stated as uncontrolled    on no meds   . Urinary frequency    AND INCONTINENCE    Past Surgical History:  Procedure Laterality Date  . CARDIAC CATHETERIZATION     normal per Dr Acie Fredrickson in note dated 02/06/12   . CATARACT EXTRACTION, BILATERAL    . CHOLECYSTECTOMY  2005  . ESOPHAGEAL DILATION  01-08-13   2 yrs ago  . FOOT SURGERY  2005   Left foot  . KNEE ARTHROSCOPY  02/28/2012   Procedure: ARTHROSCOPY KNEE;  Surgeon: Tobi Bastos, MD;  Location: [facility];  Service: Orthopedics;  Laterality: Left;  . PARTIAL HYSTERECTOMY    . SHOULDER SURGERY  2011  . TONSILLECTOMY    . TOTAL KNEE ARTHROPLASTY Left 01/17/2013   Procedure: LEFT TOTAL KNEE ARTHROPLASTY;  Surgeon: Tobi Bastos, MD;  Location: [facility];  Service: Orthopedics;  Laterality: Left;  . TOTAL KNEE ARTHROPLASTY Right 06/19/2014   Procedure: RIGHT TOTAL KNEE ARTHROPLASTY;  Surgeon: Tobi Bastos, MD;  Location: [facility];  Service: Orthopedics;  Laterality: Right;  . TUBAL LIGATION    . WRIST FLEXION TENDON TENOTOMIES AND PROXIMAL CORPECTOMY W/ WRIST ARTHRODESIS&ILIAC CREST BONE GRAFT       Current Outpatient Prescriptions  Medication Sig Dispense Refill  . acetaminophen (TYLENOL) 325 MG tablet Take 1-2 tablets (325-650 mg total) by mouth every 4 (four) hours as needed for mild pain.    [name] albuterol (PROVENTIL HFA;VENTOLIN HFA) 108 (90 BASE) MCG/ACT inhaler Inhale 2 puffs into the lungs every 4 (four) hours as needed for wheezing or shortness of breath. Take two puffs four times a day as needed 1 Inhaler prn  . albuterol (PROVENTIL) (2.5 MG/3ML) 0.083% nebulizer solution Take 3 mLs (2.5 mg total) by nebulization every 6 (six) hours as needed for wheezing. DX  491.9 360 mL 12  . ALPRAZolam (XANAX) 0.5 MG tablet Take 0.5 mg by mouth 3 (three) times daily.    [name] atorvastatin (LIPITOR) 40 MG tablet Take 1 tablet (40 mg total) by mouth at bedtime. 30 tablet 0  . azelastine (ASTELIN) 0.1 % nasal spray Place 1 spray into both nostrils 2 (two) times daily. Use in each nostril as directed    . benzonatate (TESSALON) 200 MG capsule TAKE 1 CAPSULE 3 TIMES DAILY AS NEEDED FOR COUGH 15 capsule 2  . calcium carbonate (OS-CAL) 600 MG TABS Take 600 mg by mouth at bedtime.     . carvedilol (COREG) 6.25 MG tablet Take 0.5 tablets (3.125 mg total) by mouth 2 (two) times daily. 60 tablet 11  . cholecalciferol (VITAMIN D) 1000 UNITS tablet Take 1,000 Units by mouth at bedtime.     [name] dexlansoprazole (DEXILANT) 60 MG capsule Take 1 capsule (60 mg total) by mouth every morning. 30 capsule 5  . EPIPEN 2-PAK 0.3 MG/0.3ML SOAJ injection USE AS DIRECTED 1 Device 6  . ferrous sulfate 325 (65 FE) MG tablet Take 1 tablet (325 mg total) by mouth 2 (two) times daily. 60 tablet 1  . flecainide (TAMBOCOR) 50 MG tablet Take 1.5 tablets (75 mg total) by mouth 2 (two) times daily. 90 tablet 6  . fluticasone (FLONASE) 50  MCG/ACT nasal spray Place 2 sprays into the nose daily. 16 g prn  . fluticasone furoate-vilanterol (BREO ELLIPTA) 200-25 MCG/INH AEPB Inhale 1 puff into the lungs daily. (Patient taking differently: Inhale 1 puff into the lungs daily as needed (shortness of  breath). ) 1 each 0  . furosemide (LASIX) 40 MG tablet Take 40 mg by mouth daily.    [name] losartan (COZAAR) 50 MG tablet Take 1 tablet (50 mg total) by mouth daily. (Patient taking differently: Take 50 mg by mouth at bedtime. ) 90 tablet 3  . metFORMIN (GLUCOPHAGE) 500 MG tablet Take 1 tablet (500 mg total) by mouth every evening.    . montelukast (SINGULAIR) 10 MG tablet Take 1 tablet (10 mg total) by mouth at bedtime. (Patient taking differently: Take 10 mg by mouth every morning. ) 30 tablet 11  . PARoxetine (PAXIL) 20 MG tablet Take 20 mg by mouth at bedtime.     . senna-docusate (SENOKOT-S) 8.6-50 MG per tablet Take 2 tablets by mouth 2 (two) times daily. 100 tablet 0  . vitamin B-12 (CYANOCOBALAMIN) 250 MCG tablet Take 250 mcg by mouth at bedtime.     [name] apixaban (ELIQUIS) 5 MG TABS tablet Take 1 tablet (5 mg total) by mouth 2 (two) times daily. (Patient not taking: Reported on 04/01/2016) 60 tablet 11   No current facility-administered medications for this visit.    Facility-Administered Medications Ordered in Other Visits  Medication Dose Route Frequency Provider Last Rate Last Dose  . tranexamic acid (CYKLOKAPRON) 2,000 mg in sodium chloride 0.9 % 50 mL Topical Application  123XX123 mg Topical Once [employer], PA-C        Allergies:   Bee venom; Ceclor [cefaclor]; Penicillins; Pneumococcal vaccines; Codeine; Cortisone; and Boniva [ibandronate sodium]    Social History:  The patient  reports that she has been smoking Cigarettes.  She has a 20.00 pack-year smoking history. She quit smokeless tobacco use about 4 years ago. Her smokeless tobacco use included Snuff and Chew. She reports that she drinks about 0.6 oz of alcohol per week . She  reports that she does not use drugs.   Family History:  The patient's family history includes Breast cancer in her maternal aunt, maternal grandmother, and mother; Colon cancer (age of onset: 97) in her brother; Colon polyps in her brother; Diabetes in her maternal grandfather; Heart attack in her father; Heart failure in her brother and father; Stroke in her mother; Transient ischemic attack in her mother.    ROS:  Please see the history of present illness.    Review of Systems: Constitutional:  denies fever, chills, diaphoresis, appetite change and fatigue.  HEENT: denies photophobia, eye pain, redness, hearing loss, ear pain, congestion, sore throat, rhinorrhea, sneezing, neck pain, neck stiffness and tinnitus.  Respiratory: denies SOB, DOE, cough, chest tightness, and wheezing.  Cardiovascular: denies chest pain, palpitations and leg swelling.  Gastrointestinal: denies nausea, vomiting, abdominal pain, diarrhea, constipation, blood in stool.  Genitourinary: denies dysuria, urgency, frequency, hematuria, flank pain and difficulty urinating.  Musculoskeletal: denies  myalgias, back pain, joint swelling, arthralgias and gait problem.   Skin: denies pallor, rash and wound.  Neurological: denies dizziness, seizures, syncope, weakness, light-headedness, numbness and headaches.   Hematological: denies adenopathy, easy bruising, personal or family bleeding history.  Psychiatric/ Behavioral: denies suicidal ideation, mood changes, confusion, nervousness, sleep disturbance and agitation.       All other systems are reviewed and negative.    PHYSICAL EXAM: VS:  BP 120/80 (BP Location: Left Arm, Patient Position: Sitting, Cuff Size: Normal)   Pulse (!) 51   Ht 5\' 1"  (1.549 m)   Wt 177 lb (80.3 kg)   BMI 33.44 kg/m  , BMI Body mass index is 33.44 kg/m. GEN:  Well nourished, well developed, in no acute distress  HEENT: normal  Neck: no JVD, carotid bruits, or masses Cardiac: RRR; no  murmurs, rubs, or gallops,no edema  Respiratory:  A few rhonchi , wheezes  GI: soft, nontender, nondistended, + BS MS: no deformity or atrophy  Skin: warm and dry, no rash Neuro:  Strength and sensation are intact Psych: normal   EKG:  EKG is ordered today. Sinus brady at 51.  LAD .  QRS duation is 110 ms   Recent Labs: 02/24/2016: B Natriuretic Peptide 48.6 02/25/2016: Magnesium 2.2; TSH 3.919 03/27/2016: ALT 28; BUN <5; Creatinine, Ser 0.67; Hemoglobin 11.6; Platelets 225; Potassium 3.1; Sodium 139    Lipid Panel    Component Value Date/Time   CHOL 127 02/25/2016 0313   CHOL 129 12/07/2012 1445   TRIG 173 (H) 02/25/2016 0313   TRIG 147 12/07/2012 1445   HDL 25 (L) 02/25/2016 0313   HDL 40 12/07/2012 1445   CHOLHDL 5.1 02/25/2016 0313   VLDL 35 02/25/2016 0313   LDLCALC 67 02/25/2016 0313   LDLCALC 60 12/07/2012 1445      Wt Readings from Last 3 Encounters:  04/01/16 177 lb (80.3 kg)  03/27/16 175 lb (79.4 kg)  03/21/16 175 lb 9.6 oz (79.7 kg)      Other studies Reviewed: Additional studies/ records that were reviewed today include: . Review of the above records demonstrates:    ASSESSMENT AND PLAN:  1. Paroxysmal atrial fibrillation: The patient presents with episodes of tachycardia that are associated with severe weakness and some chest discomfort as well as severe dyspnea.   She has a known history of paroxysmal atrial fibrillation although she is in  sinus rhythm today. She has a history of congestive heart failure-   She has stopped the Eliquis because she was having hematuria. I've I have encouraged her to restart the Eliquis and to talk to her medical doctor about getting a urology evaluation for her hematuria.  Continue with current dose of flecainide. She's in normal sinus rhythm today. She will follow-up with Dr. Curt Bears.     1. Takotsubo Syndrome -  She developed diffuse coronary  spasm and Takotsubo  syndrome following her knee surgery in November.  Her ejection fraction was as low as 15%.  She improved on medical therapy  2. Normal heart catheterization 2006  3. Hyperlipidemia -  Managed by her primary medical doctor. 4. Left knee pain   Current medicines are reviewed at length with the patient today.  The patient does not have concerns regarding medicines.  The following changes have been made:  no change   Disposition:   FU with 6 months     Signed, Mertie Moores, MD  04/01/2016 2:08 PM    [facility] [address], [place], [place]  [address] Phone: [phone]; Fax: [phone]

## 2016-04-01 NOTE — Patient Instructions (Addendum)
Medication Instructions:  RESTART Eliquis 5 mg twice daily   Labwork: None Ordered   Testing/Procedures: None Ordered   Follow-Up: Your physician wants you to follow-up in: 6 months with Dr. Acie Fredrickson.  You will receive a reminder letter in the mail two months in advance. If you don't receive a letter, please call our office to schedule the follow-up appointment.   If you need a refill on your cardiac medications before your next appointment, please call your pharmacy.   Thank you for choosing [facility]! Christen Bame, RN [phone]

## 2016-04-15 NOTE — Telephone Encounter (Signed)
error 

## 2016-04-27 ENCOUNTER — Encounter (HOSPITAL_COMMUNITY): Payer: Self-pay

## 2016-04-27 ENCOUNTER — Emergency Department (HOSPITAL_COMMUNITY): Payer: Medicaid Other

## 2016-04-27 DIAGNOSIS — Z7984 Long term (current) use of oral hypoglycemic drugs: Secondary | ICD-10-CM | POA: Diagnosis not present

## 2016-04-27 DIAGNOSIS — F17210 Nicotine dependence, cigarettes, uncomplicated: Secondary | ICD-10-CM | POA: Insufficient documentation

## 2016-04-27 DIAGNOSIS — E119 Type 2 diabetes mellitus without complications: Secondary | ICD-10-CM | POA: Insufficient documentation

## 2016-04-27 DIAGNOSIS — R600 Localized edema: Secondary | ICD-10-CM | POA: Insufficient documentation

## 2016-04-27 DIAGNOSIS — M25562 Pain in left knee: Secondary | ICD-10-CM | POA: Diagnosis not present

## 2016-04-27 DIAGNOSIS — I110 Hypertensive heart disease with heart failure: Secondary | ICD-10-CM | POA: Insufficient documentation

## 2016-04-27 DIAGNOSIS — I5021 Acute systolic (congestive) heart failure: Secondary | ICD-10-CM | POA: Insufficient documentation

## 2016-04-27 DIAGNOSIS — Z79899 Other long term (current) drug therapy: Secondary | ICD-10-CM | POA: Diagnosis not present

## 2016-04-27 DIAGNOSIS — J45909 Unspecified asthma, uncomplicated: Secondary | ICD-10-CM | POA: Diagnosis not present

## 2016-04-27 DIAGNOSIS — J441 Chronic obstructive pulmonary disease with (acute) exacerbation: Secondary | ICD-10-CM | POA: Insufficient documentation

## 2016-04-27 LAB — COMPREHENSIVE METABOLIC PANEL
ALT: 27 U/L (ref 14–54)
ANION GAP: 9 (ref 5–15)
AST: 23 U/L (ref 15–41)
Albumin: 4.3 g/dL (ref 3.5–5.0)
Alkaline Phosphatase: 121 U/L (ref 38–126)
BILIRUBIN TOTAL: 0.3 mg/dL (ref 0.3–1.2)
BUN: 10 mg/dL (ref 6–20)
CALCIUM: 9.4 mg/dL (ref 8.9–10.3)
CO2: 26 mmol/L (ref 22–32)
Chloride: 103 mmol/L (ref 101–111)
Creatinine, Ser: 1.13 mg/dL — ABNORMAL HIGH (ref 0.44–1.00)
GFR calc Af Amer: 59 mL/min — ABNORMAL LOW (ref >60)
GFR, EST NON AFRICAN AMERICAN: 51 mL/min — AB (ref >60)
Glucose, Bld: 217 mg/dL — ABNORMAL HIGH (ref 65–99)
POTASSIUM: 3.5 mmol/L (ref 3.5–5.1)
Sodium: 138 mmol/L (ref 135–145)
TOTAL PROTEIN: 7.9 g/dL (ref 6.5–8.1)

## 2016-04-27 LAB — CBC
HEMATOCRIT: 34.5 % — AB (ref 36.0–46.0)
Hemoglobin: 11.1 g/dL — ABNORMAL LOW (ref 12.0–15.0)
MCH: 29.6 pg (ref 26.0–34.0)
MCHC: 32.2 g/dL (ref 30.0–36.0)
MCV: 92 fL (ref 78.0–100.0)
Platelets: 227 10*3/uL (ref 150–400)
RBC: 3.75 MIL/uL — ABNORMAL LOW (ref 3.87–5.11)
RDW: 14.6 % (ref 11.5–15.5)
WBC: 6.4 10*3/uL (ref 4.0–10.5)

## 2016-04-27 NOTE — ED Triage Notes (Signed)
Pt was told by her primary Dr to come to the ED for admission that she had a severe infection in her left knee. She states that it's been swelling for a month and today was much worse.

## 2016-04-28 ENCOUNTER — Emergency Department (HOSPITAL_COMMUNITY)
Admission: EM | Admit: 2016-04-28 | Discharge: 2016-04-28 | Disposition: A | Discharge status: Home / Self Care | Payer: Medicaid Other | Attending: Emergency Medicine | Admitting: Emergency Medicine

## 2016-04-28 DIAGNOSIS — M25562 Pain in left knee: Secondary | ICD-10-CM

## 2016-04-28 LAB — SEDIMENTATION RATE: SED RATE: 47 mm/h — AB (ref 0–22)

## 2016-04-28 MED ORDER — HYDROMORPHONE HCL 1 MG/ML IJ SOLN
1.0000 mg | Freq: Once | INTRAMUSCULAR | Status: AC
  Administered 2016-04-28: 1 mg via INTRAMUSCULAR
  Filled 2016-04-28: qty 1

## 2016-04-28 MED ORDER — ONDANSETRON 4 MG PO TBDP
4.0000 mg | ORAL_TABLET | Freq: Once | ORAL | Status: AC
  Administered 2016-04-28: 4 mg via ORAL
  Filled 2016-04-28: qty 1

## 2016-04-28 MED ORDER — HYDROCODONE-ACETAMINOPHEN 5-325 MG PO TABS: 1.0000 | ORAL_TABLET | Freq: Four times a day (QID) | ORAL | 0 refills | Status: DC | PRN

## 2016-04-28 NOTE — ED Provider Notes (Signed)
[facility] DEPT Provider Note   CSN: [id number] Arrival date & time: 04/27/16  2210  By signing my name below, I, Higinio Plan, attest that this documentation has been prepared under the direction and in the presence of Blanchie Dessert, MD . Electronically Signed: Higinio Plan, Scribe. 04/28/2016. 2:36 AM.  History   Chief Complaint Chief Complaint  Patient presents with  . Knee Pain   The history is provided by the patient. No language interpreter was used.   HPI Comments: [name] [name] is a 65 y.o. female with PMHx of HTN, who presents to the Emergency Department complaining of gradually worsening, left knee pain that began 3 months ago. Pt reports associated intermittent swelling that also began 3 months ago and worsened today. She states her pain is exacerbated when bending her left knee and bearing any weight. She notes she visited her PCP on 9/26 for her pain and had blood work done. She states she received a call today advising her to visit the ED immediately because her knee was "five to ten times more infected than the limit." She states associated subjective fever. Pt states she has taken 650 mg tylenol every 8 hours for her pain and also applied heating pads to her left knee with no relief. She reports PSHx of left total knee arthroplasty on 01/17/13 and right total knee arthroplasty on 06/19/14. She notes she is currently taking Eliquis.   Past Medical History:  Diagnosis Date  . Anemia    hx of years ago   . Anxiety   . Aortic valve disorders   . Arthritis   . Asthma   . Barrett esophagus   . COPD (chronic obstructive pulmonary disease) ([place])   . Depression   . Dysrhythmia    heart skips per pt   . Esophageal reflux   . Esophageal stricture   . GAD (generalized anxiety disorder)   . GERD (gastroesophageal reflux disease)   . Heart murmur   . Hiatal hernia   . History of kidney stones 01-08-13   past hx.  . Hyperlipemia   . Hypertension 12/07/2012  . Insomnia,  unspecified   . Myocardial infarction [facility]) 01-08-13   '06-Chest pain-no stent-dx. MI-stress related  . Osteoporosis   . Personal history of colonic polyps 09/2010   TUBULAR ADENOMAS (X3); NEGATIVE FOR HIGH GRADE DYSPLASIA OR MALIGNANCY.  [name] Pneumonia    hx of   . Shortness of breath    with exertion   . Stroke ([place])    hx of 2 -remains with some right sided weaknes  . Transfusion history 01-08-13   past hx. many yrs ago  . Type II or unspecified type diabetes mellitus without mention of complication, not stated as uncontrolled    on no meds   . Urinary frequency    AND INCONTINENCE    Patient Active Problem List   Diagnosis Date Noted  . Hypokalemia 02/25/2016  . Anxiety 02/24/2016  . Stroke-like symptoms 02/24/2016  . Weakness 01/26/2016  . Atrial fibrillation ([place]) 01/26/2016  . History of colon polyps 01/28/2015  . Dysphagia 01/28/2015  . History of esophageal stricture 01/28/2015  . Primary osteoarthritis of right knee 06/24/2014  . Acute systolic heart failure ([place]) 06/23/2014  . Takotsubo syndrome 06/21/2014  . Elevated troponin 06/20/2014  . Atrial fibrillation with RVR ([place]) 06/20/2014  . Congestive dilated cardiomyopathy ([place]) 06/20/2014  . Demand ischemia of myocardium ([place]) 06/20/2014  . 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 Respiratory failure  requiring intubation ([place]) 06/19/2014  . Acute pulmonary edema ([place]) 06/19/2014  . Acute respiratory failure with hypoxia ([place]) 06/19/2014  . Hypoxemia   . Arterial hypotension   . Gait instability 10/26/2013  . Stroke ([place]) 10/26/2013  . TIA (transient ischemic attack) 10/26/2013  . Tobacco user, hx of 05/20/2013  . Postoperative anemia due to acute blood loss 01/18/2013  . Osteoarthritis of left knee 01/17/2013  . Essential hypertension 12/07/2012  . Peripheral edema 12/07/2012  . Diabetes mellitus with complication ([place]) 63/87/5643  . Chronic bronchitis with acute exacerbation ([place]) 08/05/2012  .  Seasonal and perennial allergic rhinitis 08/05/2012  . Urticaria 08/05/2012  . Chest pain 02/06/2012  . Hyperlipidemia 02/06/2012    Past Surgical History:  Procedure Laterality Date  . CARDIAC CATHETERIZATION     normal per Dr Acie Fredrickson in note dated 02/06/12   . CATARACT EXTRACTION, BILATERAL    . CHOLECYSTECTOMY  2005  . ESOPHAGEAL DILATION  01-08-13   2 yrs ago  . FOOT SURGERY  2005   Left foot  . KNEE ARTHROSCOPY  02/28/2012   Procedure: ARTHROSCOPY KNEE;  Surgeon: Tobi Bastos, MD;  Location: [facility] ORS;  Service: Orthopedics;  Laterality: Left;  . PARTIAL HYSTERECTOMY    . SHOULDER SURGERY  2011  . TONSILLECTOMY    . TOTAL KNEE ARTHROPLASTY Left 01/17/2013   Procedure: LEFT TOTAL KNEE ARTHROPLASTY;  Surgeon: Tobi Bastos, MD;  Location: [facility] OR[facility];  Service: Orthopedics;  Laterality: Left;  . TOTAL KNEE ARTHROPLASTY Right 06/19/2014   Procedure: RIGHT TOTAL KNEE ARTHROPLASTY;  Surgeon: Tobi Bastos, MD;  Location: [facility];  Service: Orthopedics;  Laterality: Right;  . TUBAL LIGATION    . WRIST FLEXION TENDON TENOTOMIES AND PROXIMAL CORPECTOMY W/ WRIST ARTHRODESIS&ILIAC CREST BONE GRAFT      OB History    No data available     Home Medications    Prior to Admission medications   Medication Sig Start Date End Date Taking? Authorizing Provider  acetaminophen (TYLENOL) 325 MG tablet Take 1-2 tablets (325-650 mg total) by mouth every 4 (four) hours as needed for mild pain. 06/27/14  Yes Ivan Anchors Love, PA-C  albuterol (PROVENTIL HFA;VENTOLIN HFA) 108 (90 BASE) MCG/ACT inhaler Inhale 2 puffs into the lungs every 4 (four) hours as needed for wheezing or shortness of breath. Take two puffs four times a day as needed 05/08/15 06/28/16 Yes Clinton D Young, MD  albuterol (PROVENTIL) (2.5 MG/3ML) 0.083% nebulizer solution Take 3 mLs (2.5 mg total) by nebulization every 6 (six) hours as needed for wheezing. DX  491.9 01/08/15  Yes Deneise Lever, MD  ALPRAZolam Duanne Moron) 0.5 MG tablet Take  0.5 mg by mouth 3 (three) times daily as needed for anxiety or sleep.    Yes Historical Provider, MD  apixaban (ELIQUIS) 5 MG TABS tablet Take 1 tablet (5 mg total) by mouth 2 (two) times daily. 04/01/16  Yes Thayer Headings, MD  atorvastatin (LIPITOR) 40 MG tablet Take 1 tablet (40 mg total) by mouth at bedtime. 07/09/13  Yes Mary-Margaret Hassell Done, FNP  azelastine (ASTELIN) 0.1 % nasal spray Place 1 spray into both nostrils 2 (two) times daily. Use in each nostril as directed   Yes Historical Provider, MD  benzonatate (TESSALON) 200 MG capsule TAKE 1 CAPSULE 3 TIMES DAILY AS NEEDED FOR COUGH 12/31/15  Yes Deneise Lever, MD  calcium carbonate (OS-CAL) 600 MG TABS Take 600 mg by mouth at bedtime.    Yes Historical Provider, MD  carvedilol (COREG) 6.25 MG tablet Take 0.5 tablets (3.125 mg total) by mouth 2 (two) times daily. 03/21/16  Yes Will Meredith Leeds, MD  cholecalciferol (VITAMIN D) 1000 UNITS tablet Take 1,000 Units by mouth at bedtime.    Yes Historical Provider, MD  dexlansoprazole (DEXILANT) 60 MG capsule Take 1 capsule (60 mg total) by mouth every morning. 12/07/12  Yes Mary-Margaret Hassell Done, FNP  EPIPEN 2-PAK 0.3 MG/0.3ML SOAJ injection USE AS DIRECTED 03/15/16  Yes Deneise Lever, MD  ferrous sulfate 325 (65 FE) MG tablet Take 1 tablet (325 mg total) by mouth 2 (two) times daily. 06/27/14  Yes Ivan Anchors Love, PA-C  flecainide (TAMBOCOR) 50 MG tablet Take 1.5 tablets (75 mg total) by mouth 2 (two) times daily. 03/24/16  Yes Will Meredith Leeds, MD  fluticasone (FLONASE) 50 MCG/ACT nasal spray Place 2 sprays into the nose daily. 05/07/13  Yes Deneise Lever, MD  furosemide (LASIX) 40 MG tablet Take 40 mg by mouth daily.   Yes Historical Provider, MD  losartan (COZAAR) 50 MG tablet Take 1 tablet (50 mg total) by mouth daily. Patient taking differently: Take 50 mg by mouth at bedtime.  07/03/14  Yes Thayer Headings, MD  metFORMIN (GLUCOPHAGE) 500 MG tablet Take 1 tablet (500 mg total) by mouth every  evening. 02/25/16  Yes Christina P Rama, MD  montelukast (SINGULAIR) 10 MG tablet Take 1 tablet (10 mg total) by mouth at bedtime. Patient taking differently: Take 10 mg by mouth every morning.  05/15/14  Yes Deneise Lever, MD  PARoxetine (PAXIL) 20 MG tablet Take 20 mg by mouth at bedtime.    Yes Historical Provider, MD  vitamin B-12 (CYANOCOBALAMIN) 250 MCG tablet Take 250 mcg by mouth at bedtime.    Yes Historical Provider, MD  fluticasone furoate-vilanterol (BREO ELLIPTA) 200-25 MCG/INH AEPB Inhale 1 puff into the lungs daily. Patient not taking: Reported on 04/28/2016 11/06/15   Deneise Lever, MD  senna-docusate (SENOKOT-S) 8.6-50 MG per tablet Take 2 tablets by mouth 2 (two) times daily. Patient not taking: Reported on 04/28/2016 06/27/14   Bary Leriche, PA-C    Family History Family History  Problem Relation Age of Onset  . Breast cancer Mother   . Stroke Mother   . Transient ischemic attack Mother   . Colon cancer Brother 44  . Colon polyps Brother   . Heart failure Brother   . Heart attack Father   . Heart failure Father   . Diabetes Maternal Grandfather   . Kidney disease      Both sides of family  . Heart disease      Both sides of family  . Breast cancer Maternal Aunt   . Breast cancer Maternal Grandmother     Social History Social History  Substance Use Topics  . Smoking status: Current Every Day Smoker    Packs/day: 0.50    Years: 40.00    Types: Cigarettes    Last attempt to quit: 06/24/2013  . Smokeless tobacco: Former Systems developer    Types: Snuff, Chew    Quit date: 12/31/2011  . Alcohol use 0.6 oz/week    1 Cans of beer per week     Comment: occasionally     Allergies   Bee venom; Ceclor [cefaclor]; Penicillins; Pneumococcal vaccines; Codeine; Cortisone; and Boniva [ibandronate sodium]   Review of Systems Review of Systems  Constitutional: Positive for fever.  Musculoskeletal: Positive for arthralgias and joint swelling (left knee).   Physical  Exam  Updated Vital Signs BP 145/68 (BP Location: Left Arm)   Pulse 63   Temp 98.7 F (37.1 C) (Oral)   Resp 18   SpO2 94%   Physical Exam  Constitutional: She is oriented to person, place, and time. She appears well-developed and well-nourished. No distress.  HENT:  Head: Normocephalic and atraumatic.  Eyes: EOM are normal.  Neck: Normal range of motion.  Cardiovascular: Normal rate, regular rhythm and normal heart sounds.   Pulmonary/Chest: Effort normal and breath sounds normal.  Abdominal: Soft. She exhibits no distension. There is no tenderness.  Musculoskeletal: She exhibits edema.  Bilateral knee replacements, left knee with effusion without erythema but significant pain on attempting to flex the knee. Only able to flex 20 degrees. 2+ DP pulse with normal sensation of the foot.   Neurological: She is alert and oriented to person, place, and time.  Skin: Skin is warm and dry.  Psychiatric: She has a normal mood and affect. Judgment normal.  Nursing note and vitals reviewed.  ED Treatments / Results  Labs (all labs ordered are listed, but only abnormal results are displayed) Labs Reviewed  CBC - Abnormal; Notable for the following:       Result Value   RBC 3.75 (*)    Hemoglobin 11.1 (*)    HCT 34.5 (*)    All other components within normal limits  COMPREHENSIVE METABOLIC PANEL - Abnormal; Notable for the following:    Glucose, Bld 217 (*)    Creatinine, Ser 1.13 (*)    GFR calc non Af Amer 51 (*)    GFR calc Af Amer 59 (*)    All other components within normal limits  SEDIMENTATION RATE - Abnormal; Notable for the following:    Sed Rate 47 (*)    All other components within normal limits    EKG  EKG Interpretation None       Radiology Dg Knee 2 Views Left  Result Date: 04/28/2016 CLINICAL DATA:  65 year old female with left knee swelling EXAM: LEFT KNEE - 1-2 VIEW COMPARISON:  Radiograph dated 01/17/13 FINDINGS: There is a total left knee arthroplasty.  There is no acute fracture or dislocation. No abnormal lucency noted to suggest hardware loosening. The joint space is maintained. The bones are osteopenic. There is a small suprapatellar effusion. There is mild induration of the Hoffa's fat pad. The soft tissues appear unremarkable. IMPRESSION: No acute fracture or dislocation. Total left knee arthroplasty appears unremarkable. Small suprapatellar joint effusion as well as mild induration of the Hoffa's fat pad. Electronically Signed   By: Anner Crete M.D.   On: 04/28/2016 00:10   Procedures Procedures (including critical care time)  Medications Ordered in ED Medications - No data to display  DIAGNOSTIC STUDIES:  Oxygen Saturation is 94% on RA, normal by my interpretation.    COORDINATION OF CARE:  2:22 AM Discussed treatment plan with pt at bedside and pt agreed to plan.  Initial Impression / Assessment and Plan / ED Course  I have reviewed the triage vital signs and the nursing notes.  Pertinent labs & imaging results that were available during my care of the patient were reviewed by me and considered in my medical decision making (see chart for details).  Clinical Course    Patient is a 65 year old female presenting today with persistent left knee pain. This is been waxing and waning for the last 3 months and worsened the last 1-2 weeks. Patient states the pain is so severe it time  she is unable to walk. She saw her PCP today who gave her a shot of antibiotics and called her tonight because of an elevated lab tests but she is not sure which one.  Patient has not had any objective fever and knee replacement was done 3 years ago. No recent injections and she has been seeing her orthopedic provider multiple times in the last 3 months for pain and swelling of the left leg specifically the knee. Patient does take Eliquis but denies trauma.  On exam patient has significant pain with range of motion and will only flex the knee to 10 or 20  before stopping. However the knee is not erythematous or warm. It is swollen. Her is no drainage. Imaging shows a small effusion. Labs done prior to being seen shows a normal white count and patient is afebrile. ESR today is elevated at 47 with her age-adjusted normal being 38.   Low suspicion the patient's symptoms today are related to infection given she's had these symptoms for the last 3 months and there's been no significant difference per the patient and her husband. The area is not erythematous or warm. Feel more likely inflammation occurring on the joint. Stressed with patient importance of follow-up with her orthopedic later today and she was much improved after pain control. Patient also placed in a knee sleeve.  I personally performed the services described in this documentation, which was scribed in my presence. The recorded information has been reviewed and is accurate.   Final Clinical Impressions(s) / ED Diagnoses   Final diagnoses:  Left knee pain    New Prescriptions Discharge Medication List as of 04/28/2016  4:44 AM    START taking these medications   Details  HYDROcodone-acetaminophen (NORCO/VICODIN) 5-325 MG tablet Take 1 tablet by mouth every 6 (six) hours as needed., Starting Thu 04/28/2016, Print         Blanchie Dessert, MD 04/28/16 [phone]

## 2016-05-09 ENCOUNTER — Encounter: Payer: Self-pay | Admitting: Internal Medicine

## 2016-05-09 ENCOUNTER — Ambulatory Visit (INDEPENDENT_AMBULATORY_CARE_PROVIDER_SITE_OTHER): Payer: Medicaid Other | Admitting: Internal Medicine

## 2016-05-09 DIAGNOSIS — Z23 Encounter for immunization: Secondary | ICD-10-CM

## 2016-05-09 DIAGNOSIS — J209 Acute bronchitis, unspecified: Secondary | ICD-10-CM

## 2016-05-09 DIAGNOSIS — J42 Unspecified chronic bronchitis: Secondary | ICD-10-CM | POA: Diagnosis not present

## 2016-05-09 MED ORDER — ALBUTEROL SULFATE HFA 108 (90 BASE) MCG/ACT IN AERS: 2.0000 | INHALATION_SPRAY | RESPIRATORY_TRACT | 3 refills | Status: DC | PRN

## 2016-05-09 MED ORDER — FLUTICASONE FUROATE-VILANTEROL 200-25 MCG/INH IN AEPB: 1.0000 | INHALATION_SPRAY | Freq: Every day | RESPIRATORY_TRACT | 3 refills | Status: DC

## 2016-05-09 NOTE — Assessment & Plan Note (Signed)
Her intermittent throat discomfort might be a mild tracheal bronchitis but is very nonspecific. There doesn't seem to be significant postnasal drainage or reflux. Plan-suggest throat lozenges, since of fluids and voice rest. If throat problems continue think consider ENT referral.

## 2016-05-09 NOTE — Patient Instructions (Signed)
Scripts sent for [employer] inhaler and ProAir rescue inhaler  Flu vax

## 2016-05-09 NOTE — Progress Notes (Signed)
07/24/12- 65 yoF former smoker (06/24/12), f  11/06/14-  65 yoF former smoker, , followed for chronic bronchitis, allergic rhinitis complicated by DM2, hx CVA FOLLOWS FOR: Pt states she is having slight chest tightness. Blames pollen for stuffy nose and chest tightness with mild wheeze, dyspnea on exertion. No chest pain or palpitation. Quit smoking 6 months ago! She is remarried CXR 06/21/14 IMPRESSION: Continued stable interstitial prominence throughout the lungs. Electronically Signed  By: Rolm Baptise M.D.  On: 06/21/2014 11:09  05/08/15-65 yoF former smoker, , followed for chronic bronchitis, allergic rhinitis complicated by DM2, hx CVA,, AFib FOLLOWS FOR: pt. states she has occ. SOB occ. wheezing. denies chest pain or tightness. dry cough x4 days. states allergies are rough. runny nose and cogestion.   Dry hacking cough for the past 3 days as she returns from honeymoon.  11/06/2015-65 year old female former smoker all of her chronic bronchitis, allergic rhinitis, complicated by  DM 2, history CVA, A. Fib FOLLOWS FOR:Pt states she is styaing tight in her chest,tired and weak-unsure if coming from allergies and pollen. Some respiratory chest tightness with increased cough. Not much wheezing. Albuterol helps. She has been out of [facility].  Eyes are mattered shut in the mornings "like sand".  05/09/2016-65 year old female former smoker followed for chronic bronchitis, allergic rhinitis, complicated by DM 2, history CVA, A. Fib FOLLOWS FOR: Pt states she continues to have "right smart" amount of cough but thinks its due to allergies; non productive mostly. "cut and dry cough" Dry irritated feeling in her throat. No phlegm. Feeling comes and goes. No Dysphagia CT chest 02/24/2016-NAD, scarring/atelectasis left base mild atherosclerosis  ROS-see HPI Constitutional:   No-   weight loss, night sweats, fevers, chills, fatigue, lassitude. HEENT:   +  headaches, difficulty swallowing, tooth/dental  problems, sore throat,       No-  Sneezing, itching, no-ear ache, +nasal congestion, +post nasal drip,  CV:  No-   chest pain, orthopnea, PND, swelling in lower extremities, anasarca, dizziness, +palpitations Resp: +  shortness of breath with exertion or at rest.              No-   productive cough,  + non-productive cough,  No- coughing up of blood.              No-   change in color of mucus.   wheezing.   Skin: No- rash or lesions. GI:  No- heartburn, indigestion, no-abdominal pain, nausea, vomiting,  GU: . MS:  + joint pain or swelling.   Neuro-     nothing unusual Psych:  No- change in mood or affect. + depression or anxiety.  No memory loss.  OBJ- Physical Exam General- Alert, Oriented, Affect-appropriate, Distress- none acute. + Overweight Skin- No rash Lymphadenopathy- none Head- atraumatic            Eyes- Gross vision intact, PERRLA, conjunctivae and secretions clear            Ears- Hearing, canals-normal            Nose- Clear, no-Septal dev, mucus, polyps, erosion, perforation             Throat- Mallampati III-IV , mucosa clear , drainage- none, tonsils- atrophic, + dentures Neck- flexible , trachea midline, no stridor , thyroid nl, carotid no bruit Chest - symmetrical excursion , unlabored           Heart/CV- RRR , no murmur , no gallop  , no rub, nl s1 s2                           -  JVD- none , edema- none, stasis changes- none, varices- none           Lung-  Cough-none , dullness-none, rub- none, wheeze-none            Chest wall-  Abd-  Br/ Gen/ Rectal- Not done, not indicated Extrem- cyanosis- none, clubbing, none, atrophy- none, strength- nl Neuro- grossly intact to observation

## 2016-05-17 ENCOUNTER — Ambulatory Visit (HOSPITAL_BASED_OUTPATIENT_CLINIC_OR_DEPARTMENT_OTHER): Payer: Medicaid Other | Attending: Cardiology | Admitting: Cardiology

## 2016-05-17 DIAGNOSIS — R0683 Snoring: Secondary | ICD-10-CM | POA: Insufficient documentation

## 2016-05-17 DIAGNOSIS — I493 Ventricular premature depolarization: Secondary | ICD-10-CM | POA: Diagnosis not present

## 2016-05-17 DIAGNOSIS — R400 Somnolence: Secondary | ICD-10-CM | POA: Insufficient documentation

## 2016-05-17 DIAGNOSIS — I480 Paroxysmal atrial fibrillation: Secondary | ICD-10-CM | POA: Insufficient documentation

## 2016-05-17 DIAGNOSIS — Z7901 Long term (current) use of anticoagulants: Secondary | ICD-10-CM | POA: Diagnosis not present

## 2016-05-17 DIAGNOSIS — I4819 Other persistent atrial fibrillation: Secondary | ICD-10-CM

## 2016-05-17 DIAGNOSIS — Z79899 Other long term (current) drug therapy: Secondary | ICD-10-CM | POA: Insufficient documentation

## 2016-05-17 DIAGNOSIS — G4719 Other hypersomnia: Secondary | ICD-10-CM | POA: Insufficient documentation

## 2016-05-17 DIAGNOSIS — I481 Persistent atrial fibrillation: Secondary | ICD-10-CM | POA: Diagnosis not present

## 2016-05-17 DIAGNOSIS — Z7984 Long term (current) use of oral hypoglycemic drugs: Secondary | ICD-10-CM | POA: Diagnosis not present

## 2016-05-17 DIAGNOSIS — G4710 Hypersomnia, unspecified: Secondary | ICD-10-CM | POA: Diagnosis not present

## 2016-05-18 NOTE — Procedures (Signed)
   Patient Name: [name] [name], [name] [name] Date: 05/17/2016 Gender: Female D.O.B: [birth year] Age (years): 38 Referring Provider: Will Camnitz Height (inches): 61 Interpreting Physician: Fransico Him MD, ABSM Weight (lbs): 168 RPSGT: Zadie Rhine BMI: 32 MRN: [id number] Neck Size: 14.00  CLINICAL INFORMATION  Sleep Study Type: NPSG Indication for sleep study: Excessive Daytime Sleepiness, Snoring Epworth Sleepiness Score: 6  SLEEP STUDY TECHNIQUE As per the AASM Manual for the Scoring of Sleep and Associated Events v2.3 (April 2016) with a hypopnea requiring 4% desaturations. The channels recorded and monitored were frontal, central and occipital EEG, electrooculogram (EOG), submentalis EMG (chin), nasal and oral airflow, thoracic and abdominal wall motion, anterior tibialis EMG, snore microphone, electrocardiogram, and pulse oximetry.  MEDICATIONS Medications self-administered by patient taken the night of the study : XANAX, ELIQUIS, LIPITOR, ASTELIN, COREG, FERROUS SULFATE, TAMBOCOR, FLONASE, COZAAR, METFORMIN, PAXIL  SLEEP ARCHITECTURE The study was initiated at 11:00:40 PM and ended at 5:02:50 AM. Sleep onset time was 100.4 minutes and the sleep efficiency was 53.4%. The total sleep time was 193.5 minutes. Stage REM latency was N/A minutes. The patient spent 11.89% of the night in stage N1 sleep, 83.20% in stage N2 sleep, 4.91% in stage N3 and 0.00% in REM. Alpha intrusion was absent. Supine sleep was 0.00%.  RESPIRATORY PARAMETERS The overall apnea/hypopnea index (AHI) was 2.2 per hour. There were 5 total apneas, including 5 obstructive, 0 central and 0 mixed apneas. There were 2 hypopneas and 47 RERAs. The AHI during Stage REM sleep was N/A per hour. AHI while supine was N/A per hour. The mean oxygen saturation was 94.68%. The minimum SpO2 during sleep was 87.00%. Soft snoring was noted during this study.  CARDIAC DATA The 2 lead EKG demonstrated sinus rhythm. The mean  heart rate was 62.32 beats per minute. Other EKG findings include: PVCs. and atrial fibrillation  LEG MOVEMENT DATA The total PLMS were 0 with a resulting PLMS index of 0.00. Associated arousal with leg movement index was 0.0 .  IMPRESSIONS - No significant obstructive sleep apnea occurred during this study (AHI = 2.2/h). - No significant central sleep apnea occurred during this study (CAI = 0.0/h). - Mild oxygen desaturation was noted during this study (Min O2 = 87.00%). - The patient snored with Soft snoring volume. - EKG findings include PVCs. and paroxysmal atrial fibrillation. - Clinically significant periodic limb movements did not occur during sleep. No significant associated arousals.  DIAGNOSIS - Paroxysmal atrial fibrillation  RECOMMENDATIONS - Avoid alcohol, sedatives and other CNS depressants that may worsen sleep apnea and disrupt normal sleep architecture. - Sleep hygiene should be reviewed to assess factors that may improve sleep quality. - Weight management and regular exercise should be initiated or continued if appropriate. - Further treatment of Paroxysmal atrial fibrillation per Cardiology.  [place], American Board of Sleep Medicine  ELECTRONICALLY SIGNED ON:  05/18/2016, 11:22 PM [place] PH: [phone]   FX: [phone] [place]

## 2016-05-31 NOTE — Progress Notes (Signed)
Electrophysiology Office Note   Date:  06/01/2016   ID:  [name], [name] 1950/12/05, MRN [id number]  PCP:  [name] Hatchet, [name]  Cardiologist:  [name] [name] Primary Electrophysiologist:  [name] Tatsch Meredith Leeds, [name]    Chief Complaint  Patient presents with  . Follow-up    PAF/Multaq      History of Present Illness: [name] [name] is a 65 y.o. female who presents today for electrophysiology evaluation.   Hx paroxysmal atrial fibrillaiton, Takotsubo, HLD. Her EF has normalized from a low of 15-20%. She was admitted to the hospital in July with chest pain, and was found to have atrial fibrillation with RVR. She had a stress test which was a low risk study. An echo at the time showed that her EF was normal. Since being seen, she has been maintained on flecainide. She has been compliant with her anticoagulation. She has been having breakthrough episodes of atrial fibrillation with feelings of palpitations, fatigue, and shortness of breath.  Today, she denies symptoms of palpitations, chest pain, shortness of breath, orthopnea, PND, lower extremity edema, claudication, dizziness, presyncope, syncope, bleeding, or neurologic sequela. The patient is tolerating medications without difficulties and is otherwise without complaint today.    Past Medical History:  Diagnosis Date  . Anemia    hx of years ago   . Anxiety   . Aortic valve disorders   . Arthritis   . Asthma   . Barrett esophagus   . COPD (chronic obstructive pulmonary disease) ([place])   . Depression   . Dysrhythmia    heart skips per pt   . Esophageal reflux   . Esophageal stricture   . GAD (generalized anxiety disorder)   . GERD (gastroesophageal reflux disease)   . Heart murmur   . Hiatal hernia   . History of kidney stones 01-08-13   past hx.  . Hyperlipemia   . Hypertension 12/07/2012  . Insomnia, unspecified   . Myocardial infarction 01-08-13   '06-Chest pain-no stent-dx. MI-stress related  . Osteoporosis   .  Personal history of colonic polyps 09/2010   TUBULAR ADENOMAS (X3); NEGATIVE FOR HIGH GRADE DYSPLASIA OR MALIGNANCY.  [name] Pneumonia    hx of   . Shortness of breath    with exertion   . Stroke ([name] [name])    hx of 2 -remains with some right sided weaknes  . Transfusion history 01-08-13   past hx. many yrs ago  . Type II or unspecified type diabetes mellitus without mention of complication, not stated as uncontrolled    on no meds   . Urinary frequency    AND INCONTINENCE   Past Surgical History:  Procedure Laterality Date  . CARDIAC CATHETERIZATION     normal per Dr [name] [name] in note dated 02/06/12   . CATARACT EXTRACTION, BILATERAL    . CHOLECYSTECTOMY  2005  . ESOPHAGEAL DILATION  01-08-13   2 yrs ago  . FOOT SURGERY  2005   Left foot  . KNEE ARTHROSCOPY  02/28/2012   Procedure: ARTHROSCOPY KNEE;  Surgeon: [name] [name];  Location: [facility];  Service: Orthopedics;  Laterality: Left;  . PARTIAL HYSTERECTOMY    . SHOULDER SURGERY  2011  . TONSILLECTOMY    . TOTAL KNEE ARTHROPLASTY Left 01/17/2013   Procedure: LEFT TOTAL KNEE ARTHROPLASTY;  Surgeon: [name] [name];  Location: [facility];  Service: Orthopedics;  Laterality: Left;  . TOTAL KNEE ARTHROPLASTY Right 06/19/2014   Procedure: RIGHT TOTAL KNEE ARTHROPLASTY;  Surgeon: [name] [name]  [name] Setters, [name];  Location: [facility];  Service: Orthopedics;  Laterality: Right;  . TUBAL LIGATION    . WRIST FLEXION TENDON TENOTOMIES AND PROXIMAL CORPECTOMY W/ WRIST ARTHRODESIS&ILIAC CREST BONE GRAFT       Current Outpatient Prescriptions  Medication Sig Dispense Refill  . acetaminophen (TYLENOL) 325 MG tablet Take 1-2 tablets (325-650 mg total) by mouth every 4 (four) hours as needed for mild pain.    [name] albuterol (PROVENTIL HFA;VENTOLIN HFA) 108 (90 Base) MCG/ACT inhaler Inhale 2 puffs into the lungs every 4 (four) hours as needed for wheezing or shortness of breath. 3 Inhaler 3  . albuterol (PROVENTIL) (2.5 MG/3ML) 0.083% nebulizer solution Take 3 mLs (2.5  mg total) by nebulization every 6 (six) hours as needed for wheezing. DX  491.9 360 mL 12  . ALPRAZolam (XANAX) 0.5 MG tablet Take 0.5 mg by mouth 3 (three) times daily as needed for anxiety or sleep.     [name] apixaban (ELIQUIS) 5 MG TABS tablet Take 1 tablet (5 mg total) by mouth 2 (two) times daily. 60 tablet 11  . atorvastatin (LIPITOR) 40 MG tablet Take 1 tablet (40 mg total) by mouth at bedtime. 30 tablet 0  . azelastine (ASTELIN) 0.1 % nasal spray Place 1 spray into both nostrils 2 (two) times daily. Use in each nostril as directed    . benzonatate (TESSALON) 200 MG capsule TAKE 1 CAPSULE 3 TIMES DAILY AS NEEDED FOR COUGH 15 capsule 2  . calcium carbonate (OS-CAL) 600 MG TABS Take 600 mg by mouth at bedtime.     . carvedilol (COREG) 6.25 MG tablet Take 0.5 tablets (3.125 mg total) by mouth 2 (two) times daily. 60 tablet 11  . cholecalciferol (VITAMIN D) 1000 UNITS tablet Take 1,000 Units by mouth at bedtime.     [name] dexlansoprazole (DEXILANT) 60 MG capsule Take 1 capsule (60 mg total) by mouth every morning. 30 capsule 5  . EPIPEN 2-PAK 0.3 MG/0.3ML SOAJ injection USE AS DIRECTED 1 Device 6  . ferrous sulfate 325 (65 FE) MG tablet Take 1 tablet (325 mg total) by mouth 2 (two) times daily. 60 tablet 1  . flecainide (TAMBOCOR) 50 MG tablet Take 1.5 tablets (75 mg total) by mouth 2 (two) times daily. 90 tablet 6  . fluticasone (FLONASE) 50 MCG/ACT nasal spray Place 2 sprays into the nose daily. 16 g prn  . fluticasone furoate-vilanterol (BREO ELLIPTA) 200-25 MCG/INH AEPB Inhale 1 puff into the lungs daily. Rinse mouth 180 each 3  . furosemide (LASIX) 40 MG tablet Take 40 mg by mouth daily.    [name] HYDROcodone-acetaminophen (NORCO/VICODIN) 5-325 MG tablet Take 1 tablet by mouth every 6 (six) hours as needed. 15 tablet 0  . losartan (COZAAR) 50 MG tablet Take 1 tablet (50 mg total) by mouth daily. (Patient taking differently: Take 50 mg by mouth at bedtime. ) 90 tablet 3  . metFORMIN (GLUCOPHAGE) 500 MG  tablet Take 1 tablet (500 mg total) by mouth every evening.    . montelukast (SINGULAIR) 10 MG tablet Take 1 tablet (10 mg total) by mouth at bedtime. (Patient taking differently: Take 10 mg by mouth every morning. ) 30 tablet 11  . PARoxetine (PAXIL) 20 MG tablet Take 20 mg by mouth at bedtime.     . senna-docusate (SENOKOT-S) 8.6-50 MG per tablet Take 2 tablets by mouth 2 (two) times daily. 100 tablet 0  . vitamin B-12 (CYANOCOBALAMIN) 250 MCG tablet Take 250 mcg by mouth at bedtime.  No current facility-administered medications for this visit.    Facility-Administered Medications Ordered in Other Visits  Medication Dose Route Frequency Provider Last Rate Last Dose  . tranexamic acid (CYKLOKAPRON) 2,000 mg in sodium chloride 0.9 % 50 mL Topical Application  123XX123 mg Topical Once [employer], PA-C        Allergies:   Bee venom; Ceclor [cefaclor]; Penicillins; Pneumococcal vaccines; Codeine; Cortisone; and Boniva [ibandronate sodium]   Social History:  The patient  reports that she quit smoking about 4 months ago. Her smoking use included Cigarettes. She has a 20.00 pack-year smoking history. She quit smokeless tobacco use about 4 years ago. Her smokeless tobacco use included Snuff and Chew. She reports that she drinks about 0.6 oz of alcohol per week . She reports that she does not use drugs.   Family History:  The patient's family history includes Breast cancer in her maternal aunt, maternal grandmother, and mother; Colon cancer (age of onset: 24) in her brother; Colon polyps in her brother; Diabetes in her maternal grandfather; Heart attack in her father; Heart failure in her brother and father; Stroke in her mother; Transient ischemic attack in her mother.    ROS:  Please see the history of present illness.   Otherwise, review of systems is positive for Chest pain, shortness of breath, palpitations, snoring, wheezing, nausea, balance problems.   All other systems are reviewed and  negative.    PHYSICAL EXAM: VS:  There were no vitals taken for this visit. , BMI There is no height or weight on file to calculate BMI. GEN: Well nourished, well developed, in no acute distress  HEENT: normal  Neck: no JVD, carotid bruits, or masses Cardiac: RRR; no murmurs, rubs, or gallops,no edema  Respiratory:  clear to auscultation bilaterally, normal work of breathing GI: soft, nontender, nondistended, + BS MS: no deformity or atrophy  Skin: warm and dry Neuro:  Strength and sensation are intact Psych: euthymic mood, full affect  EKG:  EKG is ordered today. Personal review of the ekg ordered shows sinus rhythm, rate 54  Recent Labs: 02/24/2016: B Natriuretic Peptide 48.6 02/25/2016: Magnesium 2.2; TSH 3.919 04/27/2016: ALT 27; BUN 10; Creatinine, Ser 1.13; Hemoglobin 11.1; Platelets 227; Potassium 3.5; Sodium 138    Lipid Panel     Component Value Date/Time   CHOL 127 02/25/2016 0313   CHOL 129 12/07/2012 1445   TRIG 173 (H) 02/25/2016 0313   TRIG 147 12/07/2012 1445   HDL 25 (L) 02/25/2016 0313   HDL 40 12/07/2012 1445   CHOLHDL 5.1 02/25/2016 0313   VLDL 35 02/25/2016 0313   LDLCALC 67 02/25/2016 0313   LDLCALC 60 12/07/2012 1445     Wt Readings from Last 3 Encounters:  05/17/16 168 lb (76.2 kg)  05/09/16 174 lb 6.4 oz (79.1 kg)  04/01/16 177 lb (80.3 kg)      Other studies Reviewed: Additional studies/ records that were reviewed today include: TTE 02/22/16  Review of the above records today demonstrates:  - Left ventricle: The cavity size was normal. Systolic function was   normal. The estimated ejection fraction was in the range of 55%   to 60%. Left ventricular diastolic function parameters were   normal.  02/23/16 SPECT  The left ventricular ejection fraction is normal (55-65%).  Nuclear stress EF: 60%.  There was no ST segment deviation noted during stress.  No T wave inversion was noted during stress.  Defect 1: There is a small defect of  moderate  severity present in the apical anterior and apex location. Consistent with breast attenuation and unchanged from 2015.  The study is normal.  This is a low risk study.   ASSESSMENT AND PLAN:  1.  Paroxysmal atrial fibrillation: on Eliquis and flecainide. She says that she is feeling well, though she does have breakthrough episodes of atrial fibrillation. She is interested in ablation at this time. Risks and benefits of the procedure were discussed. Risks include bleeding, tamponade, heart block, stroke, and damage to surrounding organs, among others. She understands these risks and has agreed to ablation. She would like to schedule for after the holidays and therefore we Gerldine Suleiman plan for January. In the interim we Skyelyn Scruggs continue her on her flecainide.  This patients CHA2DS2-VASc Score and unadjusted Ischemic Stroke Rate (% per year) is equal to 3.2 % stroke rate/year from a score of 3  Above score calculated as 1 point each if present [CHF, HTN, DM, Vascular=MI/PAD/Aortic Plaque, Age if 65-74, or Female] Above score calculated as 2 points each if present [Age > 75, or Stroke/TIA/TE]  2. Hypertension: well controlled today   3. 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 Her EF has improved.    Current medicines are reviewed at length with the patient today.   The patient does not have concerns regarding her medicines.  The following changes were made today:  none  Labs/ tests ordered today include:  No orders of the defined types were placed in this encounter.    Disposition:   FU with Bryten Maher 3 months  Signed, Ranyah Groeneveld Meredith Leeds, [name]  06/01/2016 11:36 AM     [facility] [address] [place] [address] [phone] (office) [phone] (fax)

## 2016-06-01 ENCOUNTER — Encounter (INDEPENDENT_AMBULATORY_CARE_PROVIDER_SITE_OTHER): Payer: Self-pay

## 2016-06-01 ENCOUNTER — Other Ambulatory Visit: Payer: Self-pay | Admitting: Cardiology

## 2016-06-01 ENCOUNTER — Ambulatory Visit (INDEPENDENT_AMBULATORY_CARE_PROVIDER_SITE_OTHER): Payer: Medicaid Other | Admitting: Cardiology

## 2016-06-01 ENCOUNTER — Encounter: Payer: Self-pay | Admitting: Cardiology

## 2016-06-01 VITALS — BP 114/66 | HR 54 | Ht 60.5 in | Wt 170.2 lb

## 2016-06-01 DIAGNOSIS — I480 Paroxysmal atrial fibrillation: Secondary | ICD-10-CM

## 2016-06-01 MED ORDER — FLECAINIDE ACETATE 50 MG PO TABS: 75.0000 mg | ORAL_TABLET | Freq: Two times a day (BID) | ORAL | 3 refills | Status: DC

## 2016-06-08 ENCOUNTER — Telehealth: Payer: Self-pay | Admitting: Internal Medicine

## 2016-06-08 MED ORDER — BUDESONIDE-FORMOTEROL FUMARATE 160-4.5 MCG/ACT IN AERO: 2.0000 | INHALATION_SPRAY | Freq: Two times a day (BID) | RESPIRATORY_TRACT | 12 refills | Status: DC

## 2016-06-08 NOTE — Telephone Encounter (Signed)
Rx sent to preferred pharmacy. Pt aware & voiced understanding. Nothing further needed.  

## 2016-06-08 NOTE — Telephone Encounter (Signed)
Suggest Symbicort 160     inhjale 2 puffs, then rinse mouth, twice daily   # 1, ref x 12

## 2016-06-08 NOTE — Telephone Encounter (Signed)
Pt's Memory Dance is requiring a PA- covered alternatives are Advair, Dulera, and Symbicort.  CMM Key to initiate PA: [id number]  CY please advise if you'd like to initiate PA, or if you'd like to switch to covered alternative.  Thanks!

## 2016-06-22 ENCOUNTER — Ambulatory Visit: Payer: Medicaid Other | Admitting: Cardiology

## 2016-07-07 ENCOUNTER — Encounter: Payer: Self-pay | Admitting: *Deleted

## 2016-07-07 ENCOUNTER — Telehealth: Payer: Self-pay | Admitting: *Deleted

## 2016-07-07 DIAGNOSIS — Z01812 Encounter for preprocedural laboratory examination: Secondary | ICD-10-CM

## 2016-07-07 DIAGNOSIS — I480 Paroxysmal atrial fibrillation: Secondary | ICD-10-CM

## 2016-07-07 NOTE — Telephone Encounter (Signed)
Scheduled AFib ablation on 08/03/16. Scheduled w/ Crystal. H&P and pre procedure labs on 07/20/16. Letter of instructions to be reviewed with pt when she comes in on 12/20. Patient verbalized understanding and agreeable to plan.   Pre cert sent

## 2016-07-13 ENCOUNTER — Encounter: Payer: Self-pay | Admitting: Cardiology

## 2016-07-20 ENCOUNTER — Ambulatory Visit (INDEPENDENT_AMBULATORY_CARE_PROVIDER_SITE_OTHER): Payer: Medicaid Other | Admitting: Cardiology

## 2016-07-20 ENCOUNTER — Encounter: Payer: Self-pay | Admitting: Cardiology

## 2016-07-20 VITALS — BP 128/76 | HR 52 | Ht 60.5 in | Wt 176.0 lb

## 2016-07-20 DIAGNOSIS — R0602 Shortness of breath: Secondary | ICD-10-CM | POA: Diagnosis not present

## 2016-07-20 DIAGNOSIS — I480 Paroxysmal atrial fibrillation: Secondary | ICD-10-CM

## 2016-07-20 NOTE — Progress Notes (Signed)
Electrophysiology Office Note   Date:  07/20/2016   ID:  [name] [name], [name] [name] 06-Oct-1950, MRN [id number]  PCP:  Velna Hatchet, MD  Cardiologist:  [place] Primary Electrophysiologist:  Will Meredith Leeds, MD    Chief Complaint  Patient presents with  . Follow-up    H&P/Pre labs     History of Present Illness: [name] [name] is a 65 y.o. female who presents today for electrophysiology evaluation.   Hx paroxysmal atrial fibrillaiton, Takotsubo, HLD. Her EF has normalized from a low of 15-20%. She was admitted to the hospital in July with chest pain, and was found to have atrial fibrillation with RVR. She had a stress test which was a low risk study. An echo at the time showed that her EF was normal. Since being seen, she has been maintained on flecainide. She has been compliant with her anticoagulation. Plan for ablation for atrial fibrillation on 08/03/16.  Over the past 2 weeks, she has noted increasing levels of shortness of breath. She says that she can walk 50 feet without getting short of breath. She also gets short of breath climbing a flight of stairs. She says that she has been propping herself up due to shortness of breath when she sleeps. She has also been waking up feeling short of breath.  Today, she denies symptoms of palpitations, chest pain,  PND, lower extremity edema, claudication, dizziness, presyncope, syncope, bleeding, or neurologic sequela. The patient is tolerating medications without difficulties and is otherwise without complaint today.    Past Medical History:  Diagnosis Date  . Anemia    hx of years ago   . Anxiety   . Aortic valve disorders   . Arthritis   . Asthma   . Barrett esophagus   . COPD (chronic obstructive pulmonary disease) ([place])   . Depression   . Dysrhythmia    heart skips per pt   . Esophageal reflux   . Esophageal stricture   . GAD (generalized anxiety disorder)   . GERD (gastroesophageal reflux disease)   . Heart murmur   .  Hiatal hernia   . History of kidney stones 01-08-13   past hx.  . Hyperlipemia   . Hypertension 12/07/2012  . Insomnia, unspecified   . Myocardial infarction 01-08-13   '06-Chest pain-no stent-dx. MI-stress related  . Osteoporosis   . Personal history of colonic polyps 09/2010   TUBULAR ADENOMAS (X3); NEGATIVE FOR HIGH GRADE DYSPLASIA OR MALIGNANCY.  [name] Pneumonia    hx of   . Shortness of breath    with exertion   . Stroke ([place])    hx of 2 -remains with some right sided weaknes  . Transfusion history 01-08-13   past hx. many yrs ago  . Type II or unspecified type diabetes mellitus without mention of complication, not stated as uncontrolled    on no meds   . Urinary frequency    AND INCONTINENCE   Past Surgical History:  Procedure Laterality Date  . CARDIAC CATHETERIZATION     normal per Dr Acie Fredrickson in note dated 02/06/12   . CATARACT EXTRACTION, BILATERAL    . CHOLECYSTECTOMY  2005  . ESOPHAGEAL DILATION  01-08-13   2 yrs ago  . FOOT SURGERY  2005   Left foot  . KNEE ARTHROSCOPY  02/28/2012   Procedure: ARTHROSCOPY KNEE;  Surgeon: Tobi Bastos, MD;  Location: [facility];  Service: Orthopedics;  Laterality: Left;  . PARTIAL HYSTERECTOMY    . SHOULDER SURGERY  2011  . TONSILLECTOMY    . TOTAL KNEE ARTHROPLASTY Left 01/17/2013   Procedure: LEFT TOTAL KNEE ARTHROPLASTY;  Surgeon: Tobi Bastos, MD;  Location: [facility];  Service: Orthopedics;  Laterality: Left;  . TOTAL KNEE ARTHROPLASTY Right 06/19/2014   Procedure: RIGHT TOTAL KNEE ARTHROPLASTY;  Surgeon: Tobi Bastos, MD;  Location: [facility];  Service: Orthopedics;  Laterality: Right;  . TUBAL LIGATION    . WRIST FLEXION TENDON TENOTOMIES AND PROXIMAL CORPECTOMY W/ WRIST ARTHRODESIS&ILIAC CREST BONE GRAFT       Current Outpatient Prescriptions  Medication Sig Dispense Refill  . acetaminophen (TYLENOL) 325 MG tablet Take 1-2 tablets (325-650 mg total) by mouth every 4 (four) hours as needed for mild pain.    [name] albuterol  (PROVENTIL HFA;VENTOLIN HFA) 108 (90 Base) MCG/ACT inhaler Inhale 2 puffs into the lungs every 4 (four) hours as needed for wheezing or shortness of breath. 3 Inhaler 3  . albuterol (PROVENTIL) (2.5 MG/3ML) 0.083% nebulizer solution Take 3 mLs (2.5 mg total) by nebulization every 6 (six) hours as needed for wheezing. DX  491.9 360 mL 12  . ALPRAZolam (XANAX) 0.5 MG tablet Take 0.5 mg by mouth 3 (three) times daily as needed for anxiety or sleep.     [name] apixaban (ELIQUIS) 5 MG TABS tablet Take 1 tablet (5 mg total) by mouth 2 (two) times daily. 60 tablet 11  . atorvastatin (LIPITOR) 40 MG tablet Take 1 tablet (40 mg total) by mouth at bedtime. 30 tablet 0  . azelastine (ASTELIN) 0.1 % nasal spray Place 1 spray into both nostrils 2 (two) times daily. Use in each nostril as directed    . benzonatate (TESSALON) 200 MG capsule TAKE 1 CAPSULE 3 TIMES DAILY AS NEEDED FOR COUGH 15 capsule 2  . budesonide-formoterol (SYMBICORT) 160-4.5 MCG/ACT inhaler Inhale 2 puffs into the lungs 2 (two) times daily. 1 Inhaler 12  . calcium carbonate (OS-CAL) 600 MG TABS Take 600 mg by mouth at bedtime.     . carvedilol (COREG) 6.25 MG tablet Take 0.5 tablets (3.125 mg total) by mouth 2 (two) times daily. 60 tablet 11  . cholecalciferol (VITAMIN D) 1000 UNITS tablet Take 1,000 Units by mouth at bedtime.     [name] dexlansoprazole (DEXILANT) 60 MG capsule Take 1 capsule (60 mg total) by mouth every morning. 30 capsule 5  . EPIPEN 2-PAK 0.3 MG/0.3ML SOAJ injection USE AS DIRECTED 1 Device 6  . ferrous sulfate 325 (65 FE) MG tablet Take 1 tablet (325 mg total) by mouth 2 (two) times daily. 60 tablet 1  . flecainide (TAMBOCOR) 50 MG tablet Take 1.5 tablets (75 mg total) by mouth 2 (two) times daily. 270 tablet 3  . fluticasone (FLONASE) 50 MCG/ACT nasal spray Place 2 sprays into the nose daily. 16 g prn  . fluticasone furoate-vilanterol (BREO ELLIPTA) 200-25 MCG/INH AEPB Inhale 1 puff into the lungs daily. Rinse mouth 180 each 3  .  furosemide (LASIX) 40 MG tablet Take 40 mg by mouth daily.    [name] losartan (COZAAR) 50 MG tablet Take 1 tablet (50 mg total) by mouth daily. (Patient taking differently: Take 50 mg by mouth at bedtime. ) 90 tablet 3  . montelukast (SINGULAIR) 10 MG tablet Take 1 tablet (10 mg total) by mouth at bedtime. (Patient taking differently: Take 10 mg by mouth every morning. ) 30 tablet 11  . PARoxetine (PAXIL) 20 MG tablet Take 20 mg by mouth at bedtime.     . senna-docusate (SENOKOT-S)  8.6-50 MG per tablet Take 2 tablets by mouth 2 (two) times daily. 100 tablet 0  . vitamin B-12 (CYANOCOBALAMIN) 250 MCG tablet Take 250 mcg by mouth at bedtime.      No current facility-administered medications for this visit.    Facility-Administered Medications Ordered in Other Visits  Medication Dose Route Frequency Provider Last Rate Last Dose  . tranexamic acid (CYKLOKAPRON) 2,000 mg in sodium chloride 0.9 % 50 mL Topical Application  123XX123 mg Topical Once [employer], PA-C        Allergies:   Bee venom; Ceclor [cefaclor]; Penicillins; Pneumococcal vaccines; Codeine; Cortisone; and Boniva [ibandronate sodium]   Social History:  The patient  reports that she quit smoking about 6 months ago. Her smoking use included Cigarettes. She has a 20.00 pack-year smoking history. She quit smokeless tobacco use about 4 years ago. Her smokeless tobacco use included Snuff and Chew. She reports that she drinks about 0.6 oz of alcohol per week . She reports that she does not use drugs.   Family History:  The patient's family history includes Breast cancer in her maternal aunt, maternal grandmother, and mother; Colon cancer (age of onset: 40) in her brother; Colon polyps in her brother; Diabetes in her maternal grandfather; Heart attack in her father; Heart failure in her brother and father; Stroke in her mother; Transient ischemic attack in her mother.    ROS:  Please see the history of present illness.   Otherwise, review of  systems is positive for weight change, chest pain, leg swelling, shortness of breath, palpitations, snoring, cough, diarrhea.   All other systems are reviewed and negative.    PHYSICAL EXAM: VS:  BP 128/76   Pulse (!) 52   Ht 5' 0.5" (1.537 m)   Wt 176 lb (79.8 kg)   BMI 33.81 kg/m  , BMI Body mass index is 33.81 kg/m. GEN: Well nourished, well developed, in no acute distress  HEENT: normal  Neck: no JVD, carotid bruits, or masses Cardiac: RRR; no murmurs, rubs, or gallops,no edema  Respiratory:  clear to auscultation bilaterally, normal work of breathing GI: soft, nontender, nondistended, + BS MS: no deformity or atrophy  Skin: warm and dry Neuro:  Strength and sensation are intact Psych: euthymic mood, full affect  EKG:  EKG is not ordered today. Personal review of the ekg ordered 06/01/16 shows sinus rhythm, rate 54  Recent Labs: 02/24/2016: B Natriuretic Peptide 48.6 02/25/2016: Magnesium 2.2; TSH 3.919 04/27/2016: ALT 27; BUN 10; Creatinine, Ser 1.13; Hemoglobin 11.1; Platelets 227; Potassium 3.5; Sodium 138    Lipid Panel     Component Value Date/Time   CHOL 127 02/25/2016 0313   CHOL 129 12/07/2012 1445   TRIG 173 (H) 02/25/2016 0313   TRIG 147 12/07/2012 1445   HDL 25 (L) 02/25/2016 0313   HDL 40 12/07/2012 1445   CHOLHDL 5.1 02/25/2016 0313   VLDL 35 02/25/2016 0313   LDLCALC 67 02/25/2016 0313   LDLCALC 60 12/07/2012 1445     Wt Readings from Last 3 Encounters:  07/20/16 176 lb (79.8 kg)  06/01/16 170 lb 3.2 oz (77.2 kg)  05/17/16 168 lb (76.2 kg)      Other studies Reviewed: Additional studies/ records that were reviewed today include: TTE 02/22/16  Review of the above records today demonstrates:  - Left ventricle: The cavity size was normal. Systolic function was   normal. The estimated ejection fraction was in the range of 55%   to 60%. Left ventricular  diastolic function parameters were   normal.  02/23/16 SPECT  The left ventricular ejection  fraction is normal (55-65%).  Nuclear stress EF: 60%.  There was no ST segment deviation noted during stress.  No T wave inversion was noted during stress.  Defect 1: There is a small defect of moderate severity present in the apical anterior and apex location. Consistent with breast attenuation and unchanged from 2015.  The study is normal.  This is a low risk study.   ASSESSMENT AND PLAN:  1.  Paroxysmal atrial fibrillation: on Eliquis and flecainide. Plan for atrial fibrillation on 08/03/16. Risks and benefits of the procedure were discussed. Risks include, but are not limited to bleeding, tamponade, heart block, stroke, and damage to surrounding organs. She understands these risks and has agreed to the procedure.  This patients CHA2DS2-VASc Score and unadjusted Ischemic Stroke Rate (% per year) is equal to 3.2 % stroke rate/year from a score of 3  Above score calculated as 1 point each if present [CHF, HTN, DM, Vascular=MI/PAD/Aortic Plaque, Age if 65-74, or Female] Above score calculated as 2 points each if present [Age > 75, or Stroke/TIA/TE]  2. Hypertension: well controlled today   3. 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 Her EF has improved. That being said, she is having more issues with shortness of breath. Due to that, we'll check a BNP today and get a TTE.   Current medicines are reviewed at length with the patient today.   The patient does not have concerns regarding her medicines.  The following changes were made today:  none  Labs/ tests ordered today include:  Orders Placed This Encounter  Procedures  . B Nat Peptide  . ECHOCARDIOGRAM COMPLETE     Disposition:   FU with Will Camnitz 3 months  Signed, Will Meredith Leeds, MD  07/20/2016 11:25 AM     [facility] [address] [address] [phone] (office) [phone] (fax)

## 2016-07-20 NOTE — Patient Instructions (Addendum)
Medication Instructions:    Your physician recommends that you continue on your current medications as directed. Please refer to the Current Medication list given to you today.  --- If you need a refill on your cardiac medications before your next appointment, please call your pharmacy. ---  Labwork:  Pre procedure labs today: BMET, CBC w/ diff, & BNP  Testing/Procedures: Your physician has requested that you have an echocardiogram (we will cancel this if BNP lab is normal) . Echocardiography is a painless test that uses sound waves to create images of your heart. It provides your doctor with information about the size and shape of your heart and how well your heart's chambers and valves are working. This procedure takes approximately one hour. There are no restrictions for this procedure.  Follow-Up:  Your physician recommends that you schedule a follow-up appointment in: 4 weeks, after your procedure on 08/03/2016, with Roderic Palau, NP in the AFib clinic.   Your physician recommends that you schedule a follow-up appointment in: 3 months, after your procedure on 08/03/2016, with Dr. Curt Bears.   Thank you for choosing [facility]!!   Trinidad Curet, RN [phone]   Any Other Special Instructions Will Be Listed Below (If Applicable).

## 2016-07-25 LAB — BRAIN NATRIURETIC PEPTIDE

## 2016-07-27 ENCOUNTER — Other Ambulatory Visit: Payer: Medicaid Other

## 2016-07-28 ENCOUNTER — Other Ambulatory Visit: Payer: Medicaid Other

## 2016-07-28 ENCOUNTER — Ambulatory Visit (HOSPITAL_COMMUNITY)
Admission: RE | Admit: 2016-07-28 | Discharge: 2016-07-28 | Disposition: A | Discharge status: Home / Self Care | Payer: Medicaid Other | Source: Ambulatory Visit | Attending: Cardiology | Admitting: Cardiology

## 2016-07-28 ENCOUNTER — Other Ambulatory Visit: Payer: Self-pay | Admitting: *Deleted

## 2016-07-28 ENCOUNTER — Encounter (HOSPITAL_COMMUNITY): Payer: Self-pay

## 2016-07-28 DIAGNOSIS — Z01812 Encounter for preprocedural laboratory examination: Secondary | ICD-10-CM

## 2016-07-28 DIAGNOSIS — I2510 Atherosclerotic heart disease of native coronary artery without angina pectoris: Secondary | ICD-10-CM | POA: Diagnosis not present

## 2016-07-28 DIAGNOSIS — K449 Diaphragmatic hernia without obstruction or gangrene: Secondary | ICD-10-CM | POA: Diagnosis not present

## 2016-07-28 DIAGNOSIS — I480 Paroxysmal atrial fibrillation: Secondary | ICD-10-CM | POA: Insufficient documentation

## 2016-07-28 DIAGNOSIS — K760 Fatty (change of) liver, not elsewhere classified: Secondary | ICD-10-CM | POA: Diagnosis not present

## 2016-07-28 DIAGNOSIS — I700 Atherosclerosis of aorta: Secondary | ICD-10-CM | POA: Diagnosis not present

## 2016-07-28 DIAGNOSIS — I4891 Unspecified atrial fibrillation: Secondary | ICD-10-CM | POA: Diagnosis not present

## 2016-07-28 LAB — POCT I-STAT, CHEM 8
BUN: 9 mg/dL (ref 6–20)
CHLORIDE: 99 mmol/L — AB (ref 101–111)
Calcium, Ion: 1.14 mmol/L — ABNORMAL LOW (ref 1.15–1.40)
Creatinine, Ser: 0.7 mg/dL (ref 0.44–1.00)
Glucose, Bld: 127 mg/dL — ABNORMAL HIGH (ref 65–99)
HCT: 34 % — ABNORMAL LOW (ref 36.0–46.0)
Hemoglobin: 11.6 g/dL — ABNORMAL LOW (ref 12.0–15.0)
Potassium: 3.4 mmol/L — ABNORMAL LOW (ref 3.5–5.1)
SODIUM: 141 mmol/L (ref 135–145)
TCO2: 27 mmol/L (ref 0–100)

## 2016-07-28 MED ORDER — NITROGLYCERIN 0.4 MG SL SUBL
0.4000 mg | SUBLINGUAL_TABLET | Freq: Once | SUBLINGUAL | Status: AC
  Administered 2016-07-28: 0.4 mg via SUBLINGUAL
  Filled 2016-07-28: qty 25

## 2016-07-28 MED ORDER — NITROGLYCERIN 0.4 MG SL SUBL
SUBLINGUAL_TABLET | SUBLINGUAL | Status: AC
  Filled 2016-07-28: qty 1

## 2016-07-28 MED ORDER — IOPAMIDOL (ISOVUE-370) INJECTION 76%
INTRAVENOUS | Status: AC
  Administered 2016-07-28: 80 mL
  Filled 2016-07-28: qty 100

## 2016-07-28 NOTE — Progress Notes (Signed)
CT scan completed. Tolerated well. D/C home walking with husband. Awake and alert. In no distress. 

## 2016-07-29 ENCOUNTER — Other Ambulatory Visit: Payer: Medicaid Other | Admitting: *Deleted

## 2016-07-29 DIAGNOSIS — G903 Multi-system degeneration of the autonomic nervous system: Secondary | ICD-10-CM

## 2016-07-29 DIAGNOSIS — I10 Essential (primary) hypertension: Secondary | ICD-10-CM

## 2016-07-29 DIAGNOSIS — I4891 Unspecified atrial fibrillation: Secondary | ICD-10-CM

## 2016-07-29 DIAGNOSIS — Z01812 Encounter for preprocedural laboratory examination: Secondary | ICD-10-CM

## 2016-07-29 DIAGNOSIS — I420 Dilated cardiomyopathy: Secondary | ICD-10-CM

## 2016-07-29 LAB — CBC WITH DIFFERENTIAL/PLATELET
BASOS ABS: 0 10*3/uL (ref 0.0–0.1)
BASOS PCT: 1 %
Eosinophils Absolute: 0.2 10*3/uL (ref 0.0–0.7)
Eosinophils Relative: 6 %
HCT: 36.8 % (ref 36.0–46.0)
HEMOGLOBIN: 12.2 g/dL (ref 12.0–15.0)
Lymphocytes Relative: 32 %
Lymphs Abs: 1.4 10*3/uL (ref 0.7–4.0)
MCH: 29.5 pg (ref 26.0–34.0)
MCHC: 33.2 g/dL (ref 30.0–36.0)
MCV: 88.9 fL (ref 78.0–100.0)
Monocytes Absolute: 0.3 10*3/uL (ref 0.1–1.0)
Monocytes Relative: 6 %
NEUTROS PCT: 55 %
Neutro Abs: 2.4 10*3/uL (ref 1.7–7.7)
Platelets: 191 10*3/uL (ref 150–400)
RBC: 4.14 MIL/uL (ref 3.87–5.11)
RDW: 15.3 % (ref 11.5–15.5)
WBC: 4.4 10*3/uL (ref 4.0–10.5)

## 2016-07-29 LAB — BRAIN NATRIURETIC PEPTIDE: B NATRIURETIC PEPTIDE 5: 24.1 pg/mL (ref 0.0–100.0)

## 2016-07-29 NOTE — Addendum Note (Signed)
Addended by: Eulis Foster on: 07/29/2016 12:40 PM   Modules accepted: Orders

## 2016-08-03 ENCOUNTER — Ambulatory Visit (HOSPITAL_COMMUNITY): Payer: Medicaid Other | Admitting: Certified Registered Nurse Anesthetist

## 2016-08-03 ENCOUNTER — Ambulatory Visit (HOSPITAL_COMMUNITY)
Admission: RE | Admit: 2016-08-03 | Discharge: 2016-08-04 | Disposition: A | Discharge status: Home / Self Care | Payer: Medicaid Other | Source: Ambulatory Visit | Attending: Cardiology | Admitting: Cardiology

## 2016-08-03 ENCOUNTER — Encounter (HOSPITAL_COMMUNITY): Payer: Self-pay | Admitting: Certified Registered Nurse Anesthetist

## 2016-08-03 ENCOUNTER — Encounter (HOSPITAL_COMMUNITY)
Admission: RE | Disposition: A | Discharge status: Home / Self Care | Payer: Self-pay | Source: Ambulatory Visit | Attending: Cardiology

## 2016-08-03 DIAGNOSIS — E669 Obesity, unspecified: Secondary | ICD-10-CM | POA: Diagnosis not present

## 2016-08-03 DIAGNOSIS — I480 Paroxysmal atrial fibrillation: Secondary | ICD-10-CM | POA: Insufficient documentation

## 2016-08-03 DIAGNOSIS — I252 Old myocardial infarction: Secondary | ICD-10-CM | POA: Insufficient documentation

## 2016-08-03 DIAGNOSIS — M1990 Unspecified osteoarthritis, unspecified site: Secondary | ICD-10-CM | POA: Diagnosis not present

## 2016-08-03 DIAGNOSIS — Z87891 Personal history of nicotine dependence: Secondary | ICD-10-CM | POA: Insufficient documentation

## 2016-08-03 DIAGNOSIS — Z6833 Body mass index (BMI) 33.0-33.9, adult: Secondary | ICD-10-CM | POA: Insufficient documentation

## 2016-08-03 DIAGNOSIS — K449 Diaphragmatic hernia without obstruction or gangrene: Secondary | ICD-10-CM | POA: Insufficient documentation

## 2016-08-03 DIAGNOSIS — I10 Essential (primary) hypertension: Secondary | ICD-10-CM | POA: Diagnosis not present

## 2016-08-03 DIAGNOSIS — E785 Hyperlipidemia, unspecified: Secondary | ICD-10-CM | POA: Insufficient documentation

## 2016-08-03 DIAGNOSIS — Z8673 Personal history of transient ischemic attack (TIA), and cerebral infarction without residual deficits: Secondary | ICD-10-CM | POA: Diagnosis not present

## 2016-08-03 DIAGNOSIS — I4891 Unspecified atrial fibrillation: Secondary | ICD-10-CM | POA: Diagnosis present

## 2016-08-03 DIAGNOSIS — J449 Chronic obstructive pulmonary disease, unspecified: Secondary | ICD-10-CM | POA: Insufficient documentation

## 2016-08-03 DIAGNOSIS — K219 Gastro-esophageal reflux disease without esophagitis: Secondary | ICD-10-CM | POA: Diagnosis not present

## 2016-08-03 DIAGNOSIS — I5021 Acute systolic (congestive) heart failure: Secondary | ICD-10-CM

## 2016-08-03 DIAGNOSIS — E119 Type 2 diabetes mellitus without complications: Secondary | ICD-10-CM | POA: Diagnosis not present

## 2016-08-03 HISTORY — DX: Personal history of other medical treatment: Z92.89

## 2016-08-03 HISTORY — PX: ELECTROPHYSIOLOGIC STUDY: SHX172A

## 2016-08-03 HISTORY — PX: ATRIAL FIBRILLATION ABLATION: SHX5732

## 2016-08-03 HISTORY — DX: Type 2 diabetes mellitus without complications: E11.9

## 2016-08-03 HISTORY — DX: Personal history of other diseases of the digestive system: Z87.19

## 2016-08-03 HISTORY — DX: Personal history of peptic ulcer disease: Z87.11

## 2016-08-03 HISTORY — DX: Heart failure, unspecified: I50.9

## 2016-08-03 HISTORY — DX: Unspecified chronic bronchitis: J42

## 2016-08-03 HISTORY — DX: Fibromyalgia: M79.7

## 2016-08-03 HISTORY — DX: Bipolar disorder, unspecified: F31.9

## 2016-08-03 LAB — GLUCOSE, CAPILLARY
GLUCOSE-CAPILLARY: 132 mg/dL — AB (ref 65–99)
Glucose-Capillary: 124 mg/dL — ABNORMAL HIGH (ref 65–99)
Glucose-Capillary: 231 mg/dL — ABNORMAL HIGH (ref 65–99)
Glucose-Capillary: 226 mg/dL — ABNORMAL HIGH (ref 65–99)

## 2016-08-03 LAB — POCT ACTIVATED CLOTTING TIME
Activated Clotting Time: 318 seconds
Activated Clotting Time: 312 seconds
Activated Clotting Time: 136 seconds

## 2016-08-03 LAB — POTASSIUM: Potassium: 3.2 mmol/L — ABNORMAL LOW (ref 3.5–5.1)

## 2016-08-03 SURGERY — ATRIAL FIBRILLATION ABLATION
Anesthesia: General

## 2016-08-03 MED ORDER — BUPIVACAINE HCL (PF) 0.25 % IJ SOLN
INTRAMUSCULAR | Status: AC
  Filled 2016-08-03: qty 60

## 2016-08-03 MED ORDER — CYANOCOBALAMIN 250 MCG PO TABS
250.0000 ug | ORAL_TABLET | Freq: Every day | ORAL | Status: DC
  Administered 2016-08-03: 22:00:00 250 ug via ORAL
  Filled 2016-08-03: qty 1

## 2016-08-03 MED ORDER — ALBUTEROL SULFATE (2.5 MG/3ML) 0.083% IN NEBU: 2.5000 mg | INHALATION_SOLUTION | Freq: Four times a day (QID) | RESPIRATORY_TRACT | Status: DC | PRN

## 2016-08-03 MED ORDER — MOMETASONE FURO-FORMOTEROL FUM 200-5 MCG/ACT IN AERO: 2.0000 | INHALATION_SPRAY | Freq: Two times a day (BID) | RESPIRATORY_TRACT | Status: DC

## 2016-08-03 MED ORDER — PAROXETINE HCL 20 MG PO TABS
20.0000 mg | ORAL_TABLET | Freq: Every day | ORAL | Status: DC
  Administered 2016-08-03: 22:00:00 20 mg via ORAL
  Filled 2016-08-03: qty 1

## 2016-08-03 MED ORDER — HEPARIN SODIUM (PORCINE) 1000 UNIT/ML IJ SOLN
INTRAMUSCULAR | Status: DC | PRN
  Administered 2016-08-03: 1000 [IU] via INTRAVENOUS
  Administered 2016-08-03: 12000 [IU] via INTRAVENOUS

## 2016-08-03 MED ORDER — BUPIVACAINE HCL (PF) 0.25 % IJ SOLN
INTRAMUSCULAR | Status: DC | PRN
  Administered 2016-08-03: 45 mL

## 2016-08-03 MED ORDER — SUGAMMADEX SODIUM 200 MG/2ML IV SOLN
INTRAVENOUS | Status: DC | PRN
  Administered 2016-08-03: 150 mg via INTRAVENOUS

## 2016-08-03 MED ORDER — VITAMIN D 1000 UNITS PO TABS
1000.0000 [IU] | ORAL_TABLET | Freq: Every day | ORAL | Status: DC
  Administered 2016-08-03: 22:00:00 1000 [IU] via ORAL
  Filled 2016-08-03: qty 1

## 2016-08-03 MED ORDER — LIDOCAINE HCL (CARDIAC) 20 MG/ML IV SOLN
INTRAVENOUS | Status: DC | PRN
  Administered 2016-08-03: 60 mg via INTRAVENOUS

## 2016-08-03 MED ORDER — GUAIFENESIN-DM 100-10 MG/5ML PO SYRP
5.0000 mL | ORAL_SOLUTION | ORAL | Status: DC | PRN
  Administered 2016-08-03: 5 mL via ORAL
  Filled 2016-08-03 (×2): qty 5

## 2016-08-03 MED ORDER — ROCURONIUM BROMIDE 100 MG/10ML IV SOLN
INTRAVENOUS | Status: DC | PRN
  Administered 2016-08-03: 50 mg via INTRAVENOUS

## 2016-08-03 MED ORDER — HEPARIN SODIUM (PORCINE) 1000 UNIT/ML IJ SOLN
INTRAMUSCULAR | Status: AC
  Filled 2016-08-03: qty 1

## 2016-08-03 MED ORDER — ACETAMINOPHEN 325 MG PO TABS: 650.0000 mg | ORAL_TABLET | ORAL | Status: DC | PRN

## 2016-08-03 MED ORDER — APIXABAN 5 MG PO TABS
5.0000 mg | ORAL_TABLET | Freq: Two times a day (BID) | ORAL | Status: DC
Start: 2016-08-03 — End: 2016-08-04
  Administered 2016-08-03 – 2016-08-04 (×2): 5 mg via ORAL
  Filled 2016-08-03 (×2): qty 1

## 2016-08-03 MED ORDER — PROPOFOL 10 MG/ML IV BOLUS
INTRAVENOUS | Status: DC | PRN
  Administered 2016-08-03: 100 mg via INTRAVENOUS

## 2016-08-03 MED ORDER — OFF THE BEAT BOOK
Freq: Once | Status: AC
  Administered 2016-08-03: 22:00:00
  Filled 2016-08-03: qty 1

## 2016-08-03 MED ORDER — FLUTICASONE FUROATE-VILANTEROL 200-25 MCG/INH IN AEPB
1.0000 | INHALATION_SPRAY | Freq: Every day | RESPIRATORY_TRACT | Status: DC
  Administered 2016-08-04: 09:00:00 1 via RESPIRATORY_TRACT
  Filled 2016-08-03: qty 28

## 2016-08-03 MED ORDER — FUROSEMIDE 40 MG PO TABS: 40.0000 mg | ORAL_TABLET | Freq: Every day | ORAL | Status: DC

## 2016-08-03 MED ORDER — ONDANSETRON HCL 4 MG/2ML IJ SOLN
INTRAMUSCULAR | Status: DC | PRN
  Administered 2016-08-03: 4 mg via INTRAVENOUS

## 2016-08-03 MED ORDER — SODIUM CHLORIDE 0.9 % IV SOLN: 250.0000 mL | INTRAVENOUS | Status: DC | PRN

## 2016-08-03 MED ORDER — FLECAINIDE ACETATE 50 MG PO TABS
75.0000 mg | ORAL_TABLET | Freq: Two times a day (BID) | ORAL | Status: DC
  Administered 2016-08-03 – 2016-08-04 (×2): 75 mg via ORAL
  Filled 2016-08-03 (×2): qty 2

## 2016-08-03 MED ORDER — ALPRAZOLAM 0.5 MG PO TABS
0.5000 mg | ORAL_TABLET | Freq: Three times a day (TID) | ORAL | Status: DC | PRN
  Administered 2016-08-03: 0.5 mg via ORAL
  Filled 2016-08-03: qty 1

## 2016-08-03 MED ORDER — MONTELUKAST SODIUM 10 MG PO TABS
10.0000 mg | ORAL_TABLET | ORAL | Status: DC
  Filled 2016-08-03: qty 1

## 2016-08-03 MED ORDER — SODIUM CHLORIDE 0.9 % IV SOLN
INTRAVENOUS | Status: DC | PRN
  Administered 2016-08-03: 08:00:00 via INTRAVENOUS

## 2016-08-03 MED ORDER — INSULIN ASPART 100 UNIT/ML ~~LOC~~ SOLN: 0.0000 [IU] | Freq: Every day | SUBCUTANEOUS | Status: DC

## 2016-08-03 MED ORDER — PHENYLEPHRINE HCL 10 MG/ML IJ SOLN
INTRAMUSCULAR | Status: DC | PRN
  Administered 2016-08-03: 20 ug/min via INTRAVENOUS

## 2016-08-03 MED ORDER — DOBUTAMINE IN D5W 4-5 MG/ML-% IV SOLN
INTRAVENOUS | Status: DC | PRN
  Administered 2016-08-03: 20 ug/kg/min via INTRAVENOUS

## 2016-08-03 MED ORDER — LOSARTAN POTASSIUM 50 MG PO TABS
50.0000 mg | ORAL_TABLET | Freq: Every day | ORAL | Status: DC
  Administered 2016-08-03: 22:00:00 50 mg via ORAL
  Filled 2016-08-03: qty 1

## 2016-08-03 MED ORDER — INSULIN ASPART 100 UNIT/ML ~~LOC~~ SOLN
0.0000 [IU] | Freq: Three times a day (TID) | SUBCUTANEOUS | Status: DC
  Administered 2016-08-04: 3 [IU] via SUBCUTANEOUS

## 2016-08-03 MED ORDER — ONDANSETRON HCL 4 MG/2ML IJ SOLN: 4.0000 mg | Freq: Four times a day (QID) | INTRAMUSCULAR | Status: DC | PRN

## 2016-08-03 MED ORDER — FERROUS SULFATE 325 (65 FE) MG PO TABS
325.0000 mg | ORAL_TABLET | Freq: Two times a day (BID) | ORAL | Status: DC
  Administered 2016-08-03 – 2016-08-04 (×2): 325 mg via ORAL
  Filled 2016-08-03 (×2): qty 1

## 2016-08-03 MED ORDER — HEPARIN SODIUM (PORCINE) 1000 UNIT/ML IJ SOLN
INTRAMUSCULAR | Status: DC | PRN
  Administered 2016-08-03: 1000 [IU] via INTRAVENOUS

## 2016-08-03 MED ORDER — DOBUTAMINE IN D5W 4-5 MG/ML-% IV SOLN
INTRAVENOUS | Status: AC
  Filled 2016-08-03: qty 250

## 2016-08-03 MED ORDER — FENTANYL CITRATE (PF) 100 MCG/2ML IJ SOLN
INTRAMUSCULAR | Status: DC | PRN
  Administered 2016-08-03: 50 ug via INTRAVENOUS

## 2016-08-03 MED ORDER — SODIUM CHLORIDE 0.9% FLUSH
3.0000 mL | Freq: Two times a day (BID) | INTRAVENOUS | Status: DC
  Administered 2016-08-03: 22:00:00 3 mL via INTRAVENOUS

## 2016-08-03 MED ORDER — SODIUM CHLORIDE 0.9% FLUSH: 3.0000 mL | INTRAVENOUS | Status: DC | PRN

## 2016-08-03 MED ORDER — CALCIUM CARBONATE 1250 (500 CA) MG PO TABS
1250.0000 mg | ORAL_TABLET | Freq: Every day | ORAL | Status: DC
  Administered 2016-08-03: 1250 mg via ORAL
  Filled 2016-08-03: qty 1

## 2016-08-03 MED ORDER — MIDAZOLAM HCL 5 MG/5ML IJ SOLN
INTRAMUSCULAR | Status: DC | PRN
  Administered 2016-08-03 (×2): 1 mg via INTRAVENOUS

## 2016-08-03 MED ORDER — ATORVASTATIN CALCIUM 40 MG PO TABS
40.0000 mg | ORAL_TABLET | Freq: Every day | ORAL | Status: DC
  Administered 2016-08-03: 40 mg via ORAL
  Filled 2016-08-03: qty 1

## 2016-08-03 MED ORDER — ACETAMINOPHEN 325 MG PO TABS: 325.0000 mg | ORAL_TABLET | ORAL | Status: DC | PRN

## 2016-08-03 MED ORDER — PANTOPRAZOLE SODIUM 40 MG PO TBEC
40.0000 mg | DELAYED_RELEASE_TABLET | Freq: Every day | ORAL | Status: DC
  Administered 2016-08-04: 40 mg via ORAL
  Filled 2016-08-03: qty 1

## 2016-08-03 MED ORDER — MENTHOL 3 MG MT LOZG
1.0000 | LOZENGE | OROMUCOSAL | Status: DC | PRN
  Administered 2016-08-03: 3 mg via ORAL
  Filled 2016-08-03: qty 9

## 2016-08-03 MED ORDER — PROTAMINE SULFATE 10 MG/ML IV SOLN
INTRAVENOUS | Status: DC | PRN
  Administered 2016-08-03: 10 mg via INTRAVENOUS
  Administered 2016-08-03: 20 mg via INTRAVENOUS
  Administered 2016-08-03: 10 mg via INTRAVENOUS

## 2016-08-03 MED ORDER — HEPARIN (PORCINE) IN NACL 2-0.9 UNIT/ML-% IJ SOLN
INTRAMUSCULAR | Status: DC | PRN
  Administered 2016-08-03: 2000 mL

## 2016-08-03 MED ORDER — ALBUTEROL SULFATE HFA 108 (90 BASE) MCG/ACT IN AERS: 2.0000 | INHALATION_SPRAY | RESPIRATORY_TRACT | Status: DC | PRN

## 2016-08-03 MED ORDER — AZELASTINE HCL 0.1 % NA SOLN
1.0000 | Freq: Two times a day (BID) | NASAL | Status: DC
Start: 2016-08-03 — End: 2016-08-04
  Administered 2016-08-03: 1 via NASAL
  Filled 2016-08-03: qty 30

## 2016-08-03 MED ORDER — CARVEDILOL 3.125 MG PO TABS
3.1250 mg | ORAL_TABLET | Freq: Two times a day (BID) | ORAL | Status: DC
  Administered 2016-08-03 – 2016-08-04 (×2): 3.125 mg via ORAL
  Filled 2016-08-03 (×2): qty 1

## 2016-08-03 MED ORDER — BISMUTH SUBSALICYLATE 262 MG/15ML PO SUSP: 30.0000 mL | Freq: Four times a day (QID) | ORAL | Status: DC | PRN

## 2016-08-03 SURGICAL SUPPLY — 22 items
BAG SNAP BAND KOVER 36X36 (MISCELLANEOUS) ×3 IMPLANT
BLANKET WARM UNDERBOD FULL ACC (MISCELLANEOUS) ×3 IMPLANT
CATH SMTCH THERMOCOOL SF DF (CATHETERS) ×2 IMPLANT
CATH SOUNDSTAR 3D IMAGING (CATHETERS) ×2 IMPLANT
CATH VARIABLE LASSO NAV 2515 (CATHETERS) ×2 IMPLANT
CATH WEBSTER BI DIR CS D-F CRV (CATHETERS) ×2 IMPLANT
COVER SWIFTLINK CONNECTOR (BAG) ×3 IMPLANT
NDL TRANSSEPTAL BRK 98CM (NEEDLE) IMPLANT
NEEDLE TRANSSEPTAL BRK 98CM (NEEDLE) ×3 IMPLANT
PACK EP LATEX FREE (CUSTOM PROCEDURE TRAY) ×3
PACK EP LF (CUSTOM PROCEDURE TRAY) ×1 IMPLANT
PAD DEFIB LIFELINK (PAD) ×3 IMPLANT
PATCH CARTO3 (PAD) ×2 IMPLANT
SHEATH AGILIS NXT 8.5F 71CM (SHEATH) ×4 IMPLANT
SHEATH AVANTI 11F 11CM (SHEATH) ×2 IMPLANT
SHEATH PINNACLE 7F 10CM (SHEATH) ×2 IMPLANT
SHEATH PINNACLE 8F 10CM (SHEATH) ×4 IMPLANT
SHEATH PINNACLE 9F 10CM (SHEATH) ×4 IMPLANT
SHIELD RADPAD SCOOP 12X17 (MISCELLANEOUS) ×3 IMPLANT
TUBING ART PRESS 72  MALE/FEM (TUBING) ×4
TUBING ART PRESS 72 MALE/FEM (TUBING) IMPLANT
TUBING SMART ABLATE COOLFLOW (TUBING) ×2 IMPLANT

## 2016-08-03 NOTE — Anesthesia Preprocedure Evaluation (Addendum)
Anesthesia Evaluation  Patient identified by MRN, date of birth, ID band Patient awake    Reviewed: Allergy & Precautions, NPO status , Patient's Chart, lab work & pertinent test results  Airway Mallampati: III  TM Distance: >3 FB     Dental  (+) Edentulous Upper, Edentulous Lower   Pulmonary shortness of breath, asthma , COPD, former smoker,    breath sounds clear to auscultation       Cardiovascular hypertension, + Past MI  + dysrhythmias  Rhythm:Regular Rate:Normal     Neuro/Psych TIA Neuromuscular disease CVA    GI/Hepatic hiatal hernia, GERD  ,  Endo/Other  diabetesMorbid obesity  Renal/GU      Musculoskeletal  (+) Arthritis ,   Abdominal   Peds  (+) premature delivery Hematology   Anesthesia Other Findings   Reproductive/Obstetrics                           Anesthesia Physical Anesthesia Plan  ASA: IV  Anesthesia Plan: General   Post-op Pain Management:    Induction: Intravenous  Airway Management Planned: Oral ETT  Additional Equipment:   Intra-op Plan:   Post-operative Plan: Extubation in OR and Possible Post-op intubation/ventilation  Informed Consent: I have reviewed the patients History and Physical, chart, labs and discussed the procedure including the risks, benefits and alternatives for the proposed anesthesia with the patient or authorized representative who has indicated his/her understanding and acceptance.   Dental advisory given  Plan Discussed with: CRNA  Anesthesia Plan Comments:         Anesthesia Quick Evaluation

## 2016-08-03 NOTE — Discharge Instructions (Signed)
No driving for 1 week. No lifting over 5 lbs for 1 week. No vigorous or sexual activity for 1 week. You may return to work on 08/10/16. Keep procedure site clean & dry. If you notice increased pain, swelling, bleeding or pus, call/return!  You may shower, but no soaking baths/hot tubs/pools for 1 week.      You have an appointment set up with the [facility].  Multiple studies have shown that being followed by a dedicated atrial fibrillation clinic in addition to the standard care you receive from your other physicians improves health. We believe that enrollment in the atrial fibrillation clinic will allow [place] to better care for you.   The phone number to the [facility] is [phone]. The clinic is staffed Monday through Friday from 8:30am to 5pm.  Parking Directions: The clinic is located in the Heart and Vascular Building connected to [facility]. 1)From [address] turn on to [address] and go to the 3rd entrance  (Heart and Vascular entrance) on the right. 2)Look to the right for Heart &Vascular Parking Garage. 3)A code for the entrance is required please call the clinic to receive this.   4)Take the elevators to the 1st floor. Registration is in the room with the glass walls at the end of the hallway.  If you have any trouble parking or locating the clinic, please dont hesitate to call [phone].

## 2016-08-03 NOTE — Transfer of Care (Signed)
Immediate Anesthesia Transfer of Care Note  Patient: [name] [name]  Procedure(s) Performed: Procedure(s): Atrial Fibrillation Ablation (N/A)  Patient Location: PACU and Cath Lab  Anesthesia Type:General  Level of Consciousness: awake, alert  and oriented  Airway & Oxygen Therapy: Patient Spontanous Breathing and Patient connected to nasal cannula oxygen  Post-op Assessment: Report given to RN and Post -op Vital signs reviewed and stable  Post vital signs: Reviewed and stable  Last Vitals:  Vitals:   08/03/16 0628  BP: 138/71  Pulse: (!) 51  Resp: 18  Temp: 36.6 C    Last Pain: There were no vitals filed for this visit.       Complications: No apparent anesthesia complications

## 2016-08-03 NOTE — Discharge Summary (Signed)
ELECTROPHYSIOLOGY PROCEDURE DISCHARGE SUMMARY    Patient ID: [name] [name],  MRN: [id number], DOB/AGE: 1951/06/30 66 y.o.  Admit date: 08/03/2016 Discharge date: 08/04/16  Primary Care Physician: Velna Hatchet, MD  Primary Cardiologist: Dr. Acie Fredrickson Electrophysiologist: Dr. Curt Bears  Primary Discharge Diagnosis:  1. Paroxysmal AFib     CHA2DS2Vasc is at least 5, on Eliquis  Secondary Discharge Diagnosis:  1. HTN 2. Hx of Takotsubo CM, improved 3. DM     Pt reports recently stopped by her PMD, and following with PMD 4. CVA 5. HLD 6. COPD  Procedures This Admission:  1.  Electrophysiology study and radiofrequency catheter ablation on 08/03/16 by Dr Curt Bears.  This study demonstrated CONCLUSIONS: 1. Sinus rhythm upon presentation.   2. Successful electrical isolation and anatomical encircling of all four pulmonary veins with radiofrequency current. 3. No inducible arrhythmias following ablation both on and off of dobutamine 4. No early apparent complications.  Brief HPI: [name] [name] is a 66 y.o. female with a history of paroxysmal atrial fibrillation.  They have failed medical therapy with flecainide. Risks, benefits, and alternatives to catheter ablation of atrial fibrillation were reviewed with the patient who wished to proceed.  The patient underwent cardiac CT prior to the procedure which demonstrated no LAA thrombus.    Hospital Course:  The patient was admitted and underwent EPS/RFCA of atrial fibrillation with details as outlined above.  They were monitored on telemetry overnight which demonstrated SR, very brief PAF.   [name] [name] continue her Flecainide, up-titrate her Coreg.  Groin was without complication on the day of discharge.  The patient was examined by Dr. Curt Bears and considered to be stable for discharge.  Wound care and restrictions were reviewed with the patient.  The patient [name] [name] be seen back by Roderic Palau, NP in 4 weeks and Dr Rayann Heman in 12 weeks for post  ablation follow up.      Physical Exam: Vitals:   08/03/16 1800 08/03/16 2143 08/04/16 0452 08/04/16 0840  BP: (!) 109/57 (!) 144/43 (!) 133/51   Pulse: 65 64 67   Resp: 19 (!) 24 (!) 22   Temp:  98.6 F (37 C) 98.7 F (37.1 C)   TempSrc:  Oral Oral   SpO2: 95% 94% 93% 94%  Weight:   174 lb 2.6 oz (79 kg)   Height:         GEN- The patient is well appearing, alert and oriented x 3 today.   HEENT: normocephalic, atraumatic; sclera clear, conjunctiva pink; hearing intact; oropharynx clear; neck supple  Lungs- Clear to ausculation bilaterally, normal work of breathing.  No wheezes, rales, rhonchi Heart- Regular rate and rhythm, no murmurs, rubs or gallops  GI- soft, non-tender, non-distended  Extremities- no clubbing, cyanosis, or edema; DP/PT/radial pulses 2+ bilaterally, groin without hematoma/bruit MS- no significant deformity or atrophy Skin- warm and dry, no rash or lesion Psych- euthymic mood, full affect Neuro- strength and sensation are intact   Labs:   Lab Results  Component Value Date   WBC 4.4 07/29/2016   HGB 12.2 07/29/2016   HCT 36.8 07/29/2016   MCV 88.9 07/29/2016   PLT 191 07/29/2016     Recent Labs Lab 07/28/16 1140 08/03/16 0635  NA 141  --   K 3.4* 3.2*  CL 99*  --   BUN 9  --   CREATININE 0.70  --   GLUCOSE 127*  --      Discharge Medications:  Allergies as of 08/04/2016  Reactions   Bee Venom Anaphylaxis   Ceclor [cefaclor] Other (See Comments)   Reaction=burning all over   Penicillins Anaphylaxis   "CLOSES OFF MY BREATHING" Has patient had a PCN reaction causing immediate rash, facial/tongue/throat swelling, SOB or lightheadedness with hypotension: Yes Has patient had a PCN reaction causing severe rash involving mucus membranes or skin necrosis: No Has patient had a PCN reaction that required hospitalization No Has patient had a PCN reaction occurring within the last 10 years: No If all of the above answers are "NO", then may  proceed with Cephalosporin use.   Pneumococcal Vaccines Other (See Comments)   PP-23 vaccine; had BIG local red reaction with heat.    Codeine Nausea And Vomiting   Cortisone Hives   All over body   Boniva [ibandronate Sodium] Other (See Comments)   Jaw popping      Medication List    TAKE these medications   acetaminophen 325 MG tablet Commonly known as:  TYLENOL Take 1-2 tablets (325-650 mg total) by mouth every 4 (four) hours as needed for mild pain.   albuterol (2.5 MG/3ML) 0.083% nebulizer solution Commonly known as:  PROVENTIL Take 3 mLs (2.5 mg total) by nebulization every 6 (six) hours as needed for wheezing. DX  491.9   albuterol 108 (90 Base) MCG/ACT inhaler Commonly known as:  PROVENTIL HFA;VENTOLIN HFA Inhale 2 puffs into the lungs every 4 (four) hours as needed for wheezing or shortness of breath.   ALPRAZolam 0.5 MG tablet Commonly known as:  XANAX Take 0.5 mg by mouth 3 (three) times daily as needed for anxiety or sleep.   apixaban 5 MG Tabs tablet Commonly known as:  ELIQUIS Take 1 tablet (5 mg total) by mouth 2 (two) times daily.   atorvastatin 40 MG tablet Commonly known as:  LIPITOR Take 1 tablet (40 mg total) by mouth at bedtime.   azelastine 0.1 % nasal spray Commonly known as:  ASTELIN Place 1 spray into both nostrils 2 (two) times daily. Use in each nostril as directed   benzonatate 200 MG capsule Commonly known as:  TESSALON TAKE 1 CAPSULE 3 TIMES DAILY AS NEEDED FOR COUGH   bismuth subsalicylate [id number] [id number] suspension Commonly known as:  PEPTO BISMOL Take 30 mLs by mouth every 6 (six) hours as needed for diarrhea or loose stools.   budesonide-formoterol 160-4.5 MCG/ACT inhaler Commonly known as:  SYMBICORT Inhale 2 puffs into the lungs 2 (two) times daily.   calcium carbonate 600 MG Tabs tablet Commonly known as:  OS-CAL Take 600 mg by mouth at bedtime.   carvedilol 6.25 MG tablet Commonly known as:  COREG Take 1 tablet (6.25 mg  total) by mouth 2 (two) times daily. What changed:  how much to take   cholecalciferol 1000 units tablet Commonly known as:  VITAMIN D Take 1,000 Units by mouth at bedtime.   dexlansoprazole 60 MG capsule Commonly known as:  DEXILANT Take 1 capsule (60 mg total) by mouth every morning.   EPIPEN 2-PAK 0.3 mg/0.3 mL Soaj injection Generic drug:  EPINEPHrine USE AS DIRECTED   ferrous sulfate 325 (65 FE) MG tablet Take 1 tablet (325 mg total) by mouth 2 (two) times daily.   flecainide 50 MG tablet Commonly known as:  TAMBOCOR Take 1.5 tablets (75 mg total) by mouth 2 (two) times daily.   fluticasone 50 MCG/ACT nasal spray Commonly known as:  FLONASE Place 2 sprays into the nose daily. What changed:  when to take this  fluticasone furoate-vilanterol 200-25 MCG/INH Aepb Commonly known as:  BREO ELLIPTA Inhale 1 puff into the lungs daily. Rinse mouth   furosemide 40 MG tablet Commonly known as:  LASIX Take 40 mg by mouth daily.   losartan 50 MG tablet Commonly known as:  COZAAR Take 1 tablet (50 mg total) by mouth daily. What changed:  when to take this   montelukast 10 MG tablet Commonly known as:  SINGULAIR Take 1 tablet (10 mg total) by mouth at bedtime. What changed:  when to take this   PARoxetine 20 MG tablet Commonly known as:  PAXIL Take 20 mg by mouth at bedtime.   vitamin B-12 250 MCG tablet Commonly known as:  CYANOCOBALAMIN Take 250 mcg by mouth at bedtime.       Disposition:  Home Discharge Instructions    Diet - low sodium heart healthy    Complete by:  As directed    Increase activity slowly    Complete by:  As directed      Follow-up Information    [facility] Follow up on 09/05/2016.   Specialty:  Cardiology Why:  11:00AM  Contact information: [address] [id number] [place] [phone]       Dalphine Cowie Meredith Leeds, MD Follow up on 10/27/2016.   Specialty:   Cardiology Why:  2:45PM Contact information: [address] [place] [address] [phone]           Duration of Discharge Encounter: Greater than 30 minutes including physician time.  Venetia Night, PA-C 08/04/2016 8:42 AM   I have seen and examined this patient with Tommye Standard.  Agree with above, note added to reflect my findings.  On exam, regular rhythm, no murmurs, lungs clear. Had ablation for AF. Uncomplicated without issues overnight. Plan for discharge home with follow up in AF clinic.    Jovi Alvizo M. Addilyne Backs MD 08/04/2016 10:59 AM

## 2016-08-03 NOTE — Anesthesia Postprocedure Evaluation (Signed)
Anesthesia Post Note  Patient: [name] [name]  Procedure(s) Performed: Procedure(s) (LRB): Atrial Fibrillation Ablation (N/A)  Patient location during evaluation: PACU Anesthesia Type: General Level of consciousness: awake and oriented Pain management: pain level controlled Vital Signs Assessment: post-procedure vital signs reviewed and stable Respiratory status: spontaneous breathing, nonlabored ventilation, respiratory function stable and patient connected to nasal cannula oxygen Cardiovascular status: blood pressure returned to baseline and stable Postop Assessment: no signs of nausea or vomiting Anesthetic complications: no       Last Vitals:  Vitals:   08/03/16 1230 08/03/16 1235  BP: 133/62 125/85  Pulse: (!) 56 63  Resp: 16 20  Temp:      Last Pain:  Vitals:   08/03/16 1147  TempSrc: Temporal                 Beverley Sherrard,JAMES TERRILL

## 2016-08-03 NOTE — Progress Notes (Signed)
Site area: Right Femoral vein 2-9Fr sheaths Site Prior to Removal: level 0, clean and dry Pressure Applied For: 37mins Manual: yes Patient Status During Pull: stable Post Pull Site: level 0, dressing clean and dry Post Pull Instructions Given:  yes Post Pull Pulses Present: yes Right DP +2 Dressing Applied: gauze with tegaderm Bedrest begins @  Comments:  Bedrest will begin once completion of left femoral sheaths pulled

## 2016-08-03 NOTE — Anesthesia Procedure Notes (Signed)
Procedure Name: Intubation Date/Time: 08/03/2016 9:00 AM Performed by: Candis Shine Pre-anesthesia Checklist: Patient identified, Emergency Drugs available, Suction available and Patient being monitored Patient Re-evaluated:Patient Re-evaluated prior to inductionOxygen Delivery Method: Circle System Utilized Preoxygenation: Pre-oxygenation with 100% oxygen Intubation Type: IV induction Ventilation: Mask ventilation without difficulty and Oral airway inserted - appropriate to patient size Laryngoscope Size: Mac and 3 Grade View: Grade I Tube type: Oral Tube size: 7.0 mm Number of attempts: 1 Airway Equipment and Method: Stylet and Oral airway Placement Confirmation: ETT inserted through vocal cords under direct vision,  positive ETCO2 and breath sounds checked- equal and bilateral Secured at: 21 cm Tube secured with: Tape Dental Injury: Teeth and Oropharynx as per pre-operative assessment

## 2016-08-03 NOTE — H&P (Signed)
[name] [name] is a 66 y.o. female with a history of atrial fibrillation. She has been anticoagulated with Eliquis and has taken flecainide with breakthrough AF. She presents for ablation of atrial fibrillation.  On exam, regular rhythm, no murmurs, lungs clear. Risks and benefits explained.  Risks include but not limed to bleeding, tamponade, heart block, stroke, and damage to surrounding organs, among others.  She understands these risks and has agreed to the procedure.  Owais Pruett Curt Bears, MD 08/03/2016 7:41 AM

## 2016-08-03 NOTE — Progress Notes (Signed)
Site area: left groin Site Prior to Removal:  Level 0 Pressure Applied For: 15 minutes Manual:   yes Patient Status During Pull:  stable Post Pull Site:  Level 0 Post Pull Instructions Given:  yes Post Pull Pulses Present: yes Dressing Applied:  Gauze/tegaderm Bedrest begins @ [id number] Comments:

## 2016-08-04 ENCOUNTER — Encounter (HOSPITAL_COMMUNITY): Payer: Self-pay | Admitting: Cardiology

## 2016-08-04 DIAGNOSIS — I10 Essential (primary) hypertension: Secondary | ICD-10-CM | POA: Diagnosis not present

## 2016-08-04 DIAGNOSIS — E119 Type 2 diabetes mellitus without complications: Secondary | ICD-10-CM | POA: Diagnosis not present

## 2016-08-04 DIAGNOSIS — I480 Paroxysmal atrial fibrillation: Secondary | ICD-10-CM

## 2016-08-04 DIAGNOSIS — I252 Old myocardial infarction: Secondary | ICD-10-CM | POA: Diagnosis not present

## 2016-08-04 LAB — BRAIN NATRIURETIC PEPTIDE

## 2016-08-04 LAB — GLUCOSE, CAPILLARY: Glucose-Capillary: 174 mg/dL — ABNORMAL HIGH (ref 65–99)

## 2016-08-04 MED ORDER — CARVEDILOL 6.25 MG PO TABS: 6.2500 mg | ORAL_TABLET | Freq: Two times a day (BID) | ORAL | 11 refills | Status: DC

## 2016-08-04 NOTE — Care Management Note (Signed)
Case Management Note  Patient Details  Name: [name] [name] MRN: [id number] Date of Birth: 02-Jul-1951  Subjective/Objective:  S/p afib ablation, already has eliquis as a home med.  NCM will cont to follow for dc needs.                   Action/Plan:   Expected Discharge Date:                  Expected Discharge Plan:  Home/Self Care  In-House Referral:     Discharge planning Services  CM Consult  Post Acute Care Choice:    Choice offered to:     DME Arranged:    DME Agency:     HH Arranged:    HH Agency:     Status of Service:  Completed, signed off  If discussed at [employer] of Stay Meetings, dates discussed:    Additional Comments:  Zenon Mayo, RN 08/04/2016, 8:52 AM

## 2016-08-10 ENCOUNTER — Ambulatory Visit (HOSPITAL_COMMUNITY): Payer: Medicaid Other | Attending: Cardiovascular Disease

## 2016-08-10 ENCOUNTER — Other Ambulatory Visit: Payer: Self-pay

## 2016-08-10 DIAGNOSIS — I10 Essential (primary) hypertension: Secondary | ICD-10-CM | POA: Insufficient documentation

## 2016-08-10 DIAGNOSIS — R0602 Shortness of breath: Secondary | ICD-10-CM

## 2016-08-10 DIAGNOSIS — E119 Type 2 diabetes mellitus without complications: Secondary | ICD-10-CM | POA: Insufficient documentation

## 2016-08-10 DIAGNOSIS — I480 Paroxysmal atrial fibrillation: Secondary | ICD-10-CM | POA: Insufficient documentation

## 2016-08-10 DIAGNOSIS — I071 Rheumatic tricuspid insufficiency: Secondary | ICD-10-CM | POA: Diagnosis not present

## 2016-08-10 DIAGNOSIS — E785 Hyperlipidemia, unspecified: Secondary | ICD-10-CM | POA: Insufficient documentation

## 2016-08-10 DIAGNOSIS — I252 Old myocardial infarction: Secondary | ICD-10-CM | POA: Diagnosis not present

## 2016-08-19 ENCOUNTER — Telehealth: Payer: Self-pay | Admitting: Cardiology

## 2016-08-19 NOTE — Telephone Encounter (Signed)
Patient instructed that she can slowly increase her activity back to normal and is able to exercise. Patient instructed not to over exert herself. Patient verbalized understanding.

## 2016-08-19 NOTE — Telephone Encounter (Signed)
New Message   Pt stated she had surgery on the 3rd, and wanting to know if she can start exercising. Requesting call back from nurse.

## 2016-09-05 ENCOUNTER — Encounter (HOSPITAL_COMMUNITY): Payer: Self-pay | Admitting: Nurse Practitioner

## 2016-09-05 ENCOUNTER — Ambulatory Visit (HOSPITAL_COMMUNITY)
Admission: RE | Admit: 2016-09-05 | Discharge: 2016-09-05 | Disposition: A | Discharge status: Home / Self Care | Payer: Medicaid Other | Source: Ambulatory Visit | Attending: Nurse Practitioner | Admitting: Nurse Practitioner

## 2016-09-05 VITALS — BP 110/62 | HR 48 | Ht 60.5 in | Wt 178.6 lb

## 2016-09-05 DIAGNOSIS — E785 Hyperlipidemia, unspecified: Secondary | ICD-10-CM | POA: Insufficient documentation

## 2016-09-05 DIAGNOSIS — F419 Anxiety disorder, unspecified: Secondary | ICD-10-CM | POA: Insufficient documentation

## 2016-09-05 DIAGNOSIS — I4891 Unspecified atrial fibrillation: Secondary | ICD-10-CM | POA: Diagnosis present

## 2016-09-05 DIAGNOSIS — I252 Old myocardial infarction: Secondary | ICD-10-CM | POA: Insufficient documentation

## 2016-09-05 DIAGNOSIS — Z79899 Other long term (current) drug therapy: Secondary | ICD-10-CM | POA: Insufficient documentation

## 2016-09-05 DIAGNOSIS — Z87442 Personal history of urinary calculi: Secondary | ICD-10-CM | POA: Diagnosis not present

## 2016-09-05 DIAGNOSIS — Z7951 Long term (current) use of inhaled steroids: Secondary | ICD-10-CM | POA: Diagnosis not present

## 2016-09-05 DIAGNOSIS — F319 Bipolar disorder, unspecified: Secondary | ICD-10-CM | POA: Insufficient documentation

## 2016-09-05 DIAGNOSIS — M797 Fibromyalgia: Secondary | ICD-10-CM | POA: Insufficient documentation

## 2016-09-05 DIAGNOSIS — Z8711 Personal history of peptic ulcer disease: Secondary | ICD-10-CM | POA: Insufficient documentation

## 2016-09-05 DIAGNOSIS — Z8673 Personal history of transient ischemic attack (TIA), and cerebral infarction without residual deficits: Secondary | ICD-10-CM | POA: Insufficient documentation

## 2016-09-05 DIAGNOSIS — I5021 Acute systolic (congestive) heart failure: Secondary | ICD-10-CM | POA: Diagnosis not present

## 2016-09-05 DIAGNOSIS — I480 Paroxysmal atrial fibrillation: Secondary | ICD-10-CM

## 2016-09-05 DIAGNOSIS — I130 Hypertensive heart and chronic kidney disease with heart failure and stage 1 through stage 4 chronic kidney disease, or unspecified chronic kidney disease: Secondary | ICD-10-CM | POA: Insufficient documentation

## 2016-09-05 DIAGNOSIS — N189 Chronic kidney disease, unspecified: Secondary | ICD-10-CM | POA: Diagnosis not present

## 2016-09-05 DIAGNOSIS — K219 Gastro-esophageal reflux disease without esophagitis: Secondary | ICD-10-CM | POA: Diagnosis not present

## 2016-09-05 DIAGNOSIS — Z7901 Long term (current) use of anticoagulants: Secondary | ICD-10-CM | POA: Diagnosis not present

## 2016-09-05 DIAGNOSIS — J449 Chronic obstructive pulmonary disease, unspecified: Secondary | ICD-10-CM | POA: Diagnosis not present

## 2016-09-05 DIAGNOSIS — I509 Heart failure, unspecified: Secondary | ICD-10-CM | POA: Diagnosis not present

## 2016-09-05 DIAGNOSIS — E1122 Type 2 diabetes mellitus with diabetic chronic kidney disease: Secondary | ICD-10-CM | POA: Insufficient documentation

## 2016-09-05 MED ORDER — CARVEDILOL 6.25 MG PO TABS: 3.1250 mg | ORAL_TABLET | Freq: Two times a day (BID) | ORAL | 11 refills | Status: DC

## 2016-09-05 NOTE — Progress Notes (Signed)
PCP: Velna Hatchet, MD Primary Cardiologist:  Dr Acie Fredrickson Primary EP:  Dr Chelsea Primus [name] [name] is a 66 y.o. female who presents today for routine electrophysiology followup.  Since his recent afib ablation, the patient reports doing very well.  She denies procedure related complications.  She has had some fatigue but is slowly improving.  Occasional chest wall pain with movement.  Today, she denies symptoms of palpitations, exertional chest pain, shortness of breath,  lower extremity edema, dizziness, presyncope, or syncope.  The patient is otherwise without complaint today.   Past Medical History:  Diagnosis Date  . Anemia    hx of years ago   . Anxiety   . Aortic valve disorders   . Arthritis   . Asthma   . Barrett esophagus   . Bipolar disorder ([place])   . CHF (congestive heart failure) ([place])   . Chronic bronchitis ([place])   . Chronic kidney disease    "I don't have but 1" (08/03/2016)  . COPD (chronic obstructive pulmonary disease) ([place])    "just have a touch" (08/03/2016)  . Depression   . Dysrhythmia    heart skips per pt   . Esophageal reflux   . Esophageal stricture   . Fibromyalgia    "hands, knees, elbows; back; think it's in my hips" (08/03/2016)  . GAD (generalized anxiety disorder)   . GERD (gastroesophageal reflux disease)   . Headache    "1-2/month" (08/03/2016)  . Heart murmur   . Hiatal hernia   . History of blood transfusion 1970s; 01/08/2013   "low blood count"  . History of kidney stones 01-08-13   past hx.  . History of stomach ulcers    "some have bleed"  . Hyperlipemia   . Hypertension 12/07/2012  . Insomnia, unspecified   . Myocardial infarction 01-08-13   '06-Chest pain-no stent-dx. MI-stress related  . Osteoporosis   . Personal history of colonic polyps 09/2010   TUBULAR ADENOMAS (X3); NEGATIVE FOR HIGH GRADE DYSPLASIA OR MALIGNANCY.  [name] Pneumonia    "several times" (08/03/2016)  . Shortness of breath    with exertion   . Stroke [facility])    "2 or  3"; remains with some right sided weaknes (08/03/2016)  . Type II diabetes mellitus ([place])   . Urinary frequency    AND INCONTINENCE   Past Surgical History:  Procedure Laterality Date  . ATRIAL FIBRILLATION ABLATION  08/03/2016  . CARDIAC CATHETERIZATION     normal per Dr Acie Fredrickson in note dated 02/06/12   . CARDIAC CATHETERIZATION     "I've had 2 or 3" (08/03/2016)  . CARPAL TUNNEL RELEASE Right   . CATARACT EXTRACTION W/ INTRAOCULAR LENS  IMPLANT, BILATERAL    . COLONOSCOPY W/ BIOPSIES AND POLYPECTOMY    . DILATION AND CURETTAGE OF UTERUS     S/P miscarriage  . ELECTROPHYSIOLOGIC STUDY N/A 08/03/2016   Procedure: Atrial Fibrillation Ablation;  Surgeon: Will Meredith Leeds, MD;  Location: [place] [facility];  Service: Cardiovascular;  Laterality: N/A;  . ESOPHAGEAL DILATION  01-08-13   2 yrs ago  . HEEL SPUR SURGERY Left 2005  . KNEE ARTHROSCOPY  02/28/2012   Procedure: ARTHROSCOPY KNEE;  Surgeon: Tobi Bastos, MD;  [facility]: [facility];  Service: Orthopedics;  Laterality: Left;  . LAPAROSCOPIC CHOLECYSTECTOMY  03/2002  . SHOULDER OPEN ROTATOR CUFF REPAIR Right 2011  . TONSILLECTOMY AND ADENOIDECTOMY  1965  . TOTAL KNEE ARTHROPLASTY Left 01/17/2013   Procedure: LEFT TOTAL KNEE ARTHROPLASTY;  Surgeon: Tobi Bastos, MD;  Location: [facility];  Service: Orthopedics;  Laterality: Left;  . TOTAL KNEE ARTHROPLASTY Right 06/19/2014   Procedure: RIGHT TOTAL KNEE ARTHROPLASTY;  Surgeon: Tobi Bastos, MD;  [facility];  Service: Orthopedics;  Laterality: Right;  . TUBAL LIGATION    . VAGINAL HYSTERECTOMY     "partial"  . WRIST FLEXION TENDON TENOTOMIES AND PROXIMAL CORPECTOMY W/ WRIST ARTHRODESIS&ILIAC CREST BONE GRAFT      ROS- all systems are reviewed and negatives except as per HPI above  Current Outpatient Prescriptions  Medication Sig Dispense Refill  . acetaminophen (TYLENOL) 325 MG tablet Take 1-2 tablets (325-650 mg total) by mouth every 4 (four) hours as needed for mild pain.      [name] albuterol (PROVENTIL HFA;VENTOLIN HFA) 108 (90 Base) MCG/ACT inhaler Inhale 2 puffs into the lungs every 4 (four) hours as needed for wheezing or shortness of breath. 3 Inhaler 3  . albuterol (PROVENTIL) (2.5 MG/3ML) 0.083% nebulizer solution Take 3 mLs (2.5 mg total) by nebulization every 6 (six) hours as needed for wheezing. DX  491.9 360 mL 12  . ALPRAZolam (XANAX) 0.5 MG tablet Take 0.5 mg by mouth 3 (three) times daily as needed for anxiety or sleep.     [name] apixaban (ELIQUIS) 5 MG TABS tablet Take 1 tablet (5 mg total) by mouth 2 (two) times daily. 60 tablet 11  . atorvastatin (LIPITOR) 40 MG tablet Take 1 tablet (40 mg total) by mouth at bedtime. 30 tablet 0  . azelastine (ASTELIN) 0.1 % nasal spray Place 1 spray into both nostrils 2 (two) times daily. Use in each nostril as directed    . benzonatate (TESSALON) 200 MG capsule TAKE 1 CAPSULE 3 TIMES DAILY AS NEEDED FOR COUGH 15 capsule 2  . bismuth subsalicylate (PEPTO BISMOL) 262 MG/15ML suspension Take 30 mLs by mouth every 6 (six) hours as needed for diarrhea or loose stools.    . budesonide-formoterol (SYMBICORT) 160-4.5 MCG/ACT inhaler Inhale 2 puffs into the lungs 2 (two) times daily. 1 Inhaler 12  . calcium carbonate (OS-CAL) 600 MG TABS Take 600 mg by mouth at bedtime.     . carvedilol (COREG) 6.25 MG tablet Take 1 tablet (6.25 mg total) by mouth 2 (two) times daily. 60 tablet 11  . cholecalciferol (VITAMIN D) 1000 UNITS tablet Take 1,000 Units by mouth at bedtime.     [name] dexlansoprazole (DEXILANT) 60 MG capsule Take 1 capsule (60 mg total) by mouth every morning. 30 capsule 5  . EPIPEN 2-PAK 0.3 MG/0.3ML SOAJ injection USE AS DIRECTED 1 Device 6  . ferrous sulfate 325 (65 FE) MG tablet Take 1 tablet (325 mg total) by mouth 2 (two) times daily. 60 tablet 1  . flecainide (TAMBOCOR) 50 MG tablet Take 1.5 tablets (75 mg total) by mouth 2 (two) times daily. 270 tablet 3  . fluticasone (FLONASE) 50 MCG/ACT nasal spray Place 2 sprays into  the nose daily. (Patient taking differently: Place 2 sprays into the nose 2 (two) times daily. ) 16 g prn  . furosemide (LASIX) 40 MG tablet Take 40 mg by mouth daily.    [name] losartan (COZAAR) 50 MG tablet Take 1 tablet (50 mg total) by mouth daily. (Patient taking differently: Take 50 mg by mouth at bedtime. ) 90 tablet 3  . montelukast (SINGULAIR) 10 MG tablet Take 1 tablet (10 mg total) by mouth at bedtime. (Patient taking differently: Take 10 mg by mouth every morning. ) 30 tablet 11  .  PARoxetine (PAXIL) 20 MG tablet Take 20 mg by mouth at bedtime.     . vitamin B-12 (CYANOCOBALAMIN) 250 MCG tablet Take 250 mcg by mouth at bedtime.      No current facility-administered medications for this encounter.    Facility-Administered Medications Ordered in Other Encounters  Medication Dose Route Frequency Provider Last Rate Last Dose  . tranexamic acid (CYKLOKAPRON) 2,000 mg in sodium chloride 0.9 % 50 mL Topical Application  123XX123 mg Topical Once [employer], PA-C        Physical Exam: Vitals:   09/05/16 1106  BP: 110/62  Pulse: (!) 48  Weight: 178 lb 9.6 oz (81 kg)  Height: 5' 0.5" (1.537 m)   GEN- The patient is overweight appearing, alert and oriented x 3 today.   Head- normocephalic, atraumatic Eyes-  Sclera clear, conjunctiva pink Ears- hearing intact Oropharynx- clear Lungs- Clear to ausculation bilaterally, normal work of breathing Heart- Regular rate and rhythm, no murmurs, rubs or gallops, PMI not laterally displaced GI- soft, NT, ND, + BS Extremities- no clubbing, cyanosis, or edema  ekg today reveals sinus bradycardia 48 bpm, PR 170 msec, incomplete LBBB  Assessment and Plan:  1. Paroxysmal Atrial fibrillation Doing well s/p ablation Stop flecainide Reduce coreg to 3.125mg  BID today due to bradycardia Importance of compliance with eliquis was stressed today  Follow-up with Dr Curt Bears as scheduled  Thompson Grayer MD, [facility] 09/05/2016 11:30 AM

## 2016-09-05 NOTE — Patient Instructions (Signed)
Your physician has recommended you make the following change in your medication:  1)Stop flecainide 2)decrease coreg to 3.125mg  (1/2 tablet) twice a day

## 2016-10-11 ENCOUNTER — Encounter: Payer: Self-pay | Admitting: Cardiovascular Disease

## 2016-10-17 ENCOUNTER — Ambulatory Visit: Payer: Medicaid Other | Admitting: Cardiovascular Disease

## 2016-10-18 ENCOUNTER — Encounter: Payer: Self-pay | Admitting: Cardiology

## 2016-10-27 ENCOUNTER — Encounter (INDEPENDENT_AMBULATORY_CARE_PROVIDER_SITE_OTHER): Payer: Self-pay

## 2016-10-27 ENCOUNTER — Encounter: Payer: Self-pay | Admitting: Cardiology

## 2016-10-27 ENCOUNTER — Ambulatory Visit (INDEPENDENT_AMBULATORY_CARE_PROVIDER_SITE_OTHER): Payer: Medicaid Other | Admitting: Cardiology

## 2016-10-27 VITALS — BP 112/68 | HR 52 | Ht 60.5 in | Wt 180.8 lb

## 2016-10-27 DIAGNOSIS — I480 Paroxysmal atrial fibrillation: Secondary | ICD-10-CM

## 2016-10-27 MED ORDER — ISOSORBIDE MONONITRATE ER 30 MG PO TB24: 30.0000 mg | ORAL_TABLET | Freq: Every day | ORAL | 3 refills | Status: DC

## 2016-10-27 NOTE — Progress Notes (Signed)
Electrophysiology Office Note   Date:  10/27/2016   ID:  [name], [name] 1950/09/18, MRN [id number]  PCP:  Velna Hatchet, MD  Cardiologist:  Nahser Primary Electrophysiologist:  Reichen Hutzler Meredith Leeds, MD    Chief Complaint  Patient presents with  . Follow-up    PAF     History of Present Illness: [name] [name] is a 66 y.o. female who presents today for electrophysiology evaluation.   Hx paroxysmal atrial fibrillaiton, Takotsubo, HLD. Her EF has normalized from a low of 15-20%. She was admitted to the hospital in July with chest pain, and was found to have atrial fibrillation with RVR. She had a stress test which was a low risk study. An echo at the time showed that her EF was normal. Since being seen, she has been maintained on flecainide. She has been compliant with her anticoagulation. Ablation for atrial fibrillation on 08/03/16.She is having no further episodes of palpitations. She has continued to have chest pain. She says the pain is in the center of her chest and is worse with exertion. It is associated with shortness of breath. The pain is improved when she rests. She does not have any chest pain when she is at rest. Her pain is not associated with position or deep breathing.  Today, she denies symptoms of palpitations, chest pain,  PND, lower extremity edema, claudication, dizziness, presyncope, syncope, bleeding, or neurologic sequela. The patient is tolerating medications without difficulties and is otherwise without complaint today.    Past Medical History:  Diagnosis Date  . Anemia    hx of years ago   . Anxiety   . Aortic valve disorders   . Arthritis   . Asthma   . Barrett esophagus   . Bipolar disorder ([place])   . CHF (congestive heart failure) ([place])   . Chronic bronchitis ([place])   . Chronic kidney disease    "I don't have but 1" (08/03/2016)  . COPD (chronic obstructive pulmonary disease) ([place])    "just have a touch" (08/03/2016)  . Depression   .  Esophageal reflux   . Esophageal stricture   . Fibromyalgia    "hands, knees, elbows; back; think it's in my hips" (08/03/2016)  . GAD (generalized anxiety disorder)   . GERD (gastroesophageal reflux disease)   . Hiatal hernia   . History of blood transfusion 1970s; 01/08/2013   "low blood count"  . History of kidney stones 01-08-13   past hx.  . History of stomach ulcers    "some have bleed"  . Hyperlipemia   . Hypertension 12/07/2012  . Insomnia, unspecified   . Myocardial infarction 01-08-13   '06-Chest pain-no stent-dx. MI-stress related  . Osteoporosis   . Personal history of colonic polyps 09/2010   TUBULAR ADENOMAS (X3); NEGATIVE FOR HIGH GRADE DYSPLASIA OR MALIGNANCY.  [name] Pneumonia    "several times" (08/03/2016)  . Stroke [facility])    "2 or 3"; remains with some right sided weaknes (08/03/2016)  . Type II diabetes mellitus ([place])   . Urinary frequency    AND INCONTINENCE   Past Surgical History:  Procedure Laterality Date  . ATRIAL FIBRILLATION ABLATION  08/03/2016  . CARDIAC CATHETERIZATION     normal per Dr Acie Fredrickson in note dated 02/06/12   . CARDIAC CATHETERIZATION     "I've had 2 or 3" (08/03/2016)  . CARPAL TUNNEL RELEASE Right   . CATARACT EXTRACTION W/ INTRAOCULAR LENS  IMPLANT, BILATERAL    . COLONOSCOPY W/ BIOPSIES AND  POLYPECTOMY    . DILATION AND CURETTAGE OF UTERUS     S/P miscarriage  . ELECTROPHYSIOLOGIC STUDY N/A 08/03/2016   Procedure: Atrial Fibrillation Ablation;  Surgeon: Cagney Degrace Meredith Leeds, MD;  Location: [place] [facility];  Service: Cardiovascular;  Laterality: N/A;  . ESOPHAGEAL DILATION  01-08-13   2 yrs ago  . HEEL SPUR SURGERY Left 2005  . KNEE ARTHROSCOPY  02/28/2012   Procedure: ARTHROSCOPY KNEE;  Surgeon: Tobi Bastos, MD;  [facility];  Service: Orthopedics;  Laterality: Left;  . LAPAROSCOPIC CHOLECYSTECTOMY  03/2002  . SHOULDER OPEN ROTATOR CUFF REPAIR Right 2011  . TONSILLECTOMY AND ADENOIDECTOMY  1965  . TOTAL KNEE ARTHROPLASTY Left  01/17/2013   Procedure: LEFT TOTAL KNEE ARTHROPLASTY;  Surgeon: Tobi Bastos, MD;  [facility];  Service: Orthopedics;  Laterality: Left;  . TOTAL KNEE ARTHROPLASTY Right 06/19/2014   Procedure: RIGHT TOTAL KNEE ARTHROPLASTY;  Surgeon: Tobi Bastos, MD;  [facility];  Service: Orthopedics;  Laterality: Right;  . TUBAL LIGATION    . VAGINAL HYSTERECTOMY     "partial"  . WRIST FLEXION TENDON TENOTOMIES AND PROXIMAL CORPECTOMY W/ WRIST ARTHRODESIS&ILIAC CREST BONE GRAFT       Current Outpatient Prescriptions  Medication Sig Dispense Refill  . acetaminophen (TYLENOL) 325 MG tablet Take 1-2 tablets (325-650 mg total) by mouth every 4 (four) hours as needed for mild pain.    [name] albuterol (PROVENTIL HFA;VENTOLIN HFA) 108 (90 Base) MCG/ACT inhaler Inhale 2 puffs into the lungs every 4 (four) hours as needed for wheezing or shortness of breath. 3 Inhaler 3  . albuterol (PROVENTIL) (2.5 MG/3ML) 0.083% nebulizer solution Take 3 mLs (2.5 mg total) by nebulization every 6 (six) hours as needed for wheezing. DX  491.9 360 mL 12  . ALPRAZolam (XANAX) 0.5 MG tablet Take 0.5 mg by mouth 3 (three) times daily as needed for anxiety or sleep.     [name] apixaban (ELIQUIS) 5 MG TABS tablet Take 1 tablet (5 mg total) by mouth 2 (two) times daily. 60 tablet 11  . atorvastatin (LIPITOR) 40 MG tablet Take 1 tablet (40 mg total) by mouth at bedtime. 30 tablet 0  . azelastine (ASTELIN) 0.1 % nasal spray Place 1 spray into both nostrils 2 (two) times daily. Use in each nostril as directed    . benzonatate (TESSALON) 200 MG capsule TAKE 1 CAPSULE 3 TIMES DAILY AS NEEDED FOR COUGH 15 capsule 2  . bismuth subsalicylate (PEPTO BISMOL) 262 MG/15ML suspension Take 30 mLs by mouth every 6 (six) hours as needed for diarrhea or loose stools.    . budesonide-formoterol (SYMBICORT) 160-4.5 MCG/ACT inhaler Inhale 2 puffs into the lungs 2 (two) times daily. 1 Inhaler 12  . calcium carbonate (OS-CAL) 600 MG TABS Take 600 mg  by mouth at bedtime.     . carvedilol (COREG) 6.25 MG tablet Take 0.5 tablets (3.125 mg total) by mouth 2 (two) times daily. 60 tablet 11  . cholecalciferol (VITAMIN D) 1000 UNITS tablet Take 1,000 Units by mouth at bedtime.     [name] EPIPEN 2-PAK 0.3 MG/0.3ML SOAJ injection USE AS DIRECTED 1 Device 6  . ferrous sulfate 325 (65 FE) MG tablet Take 1 tablet (325 mg total) by mouth 2 (two) times daily. 60 tablet 1  . fluticasone (FLONASE) 50 MCG/ACT nasal spray Place 2 sprays into the nose daily. (Patient taking differently: Place 2 sprays into the nose 2 (two) times daily. ) 16 g prn  . furosemide (  LASIX) 40 MG tablet Take 40 mg by mouth daily.    [name] losartan (COZAAR) 50 MG tablet Take 1 tablet (50 mg total) by mouth daily. (Patient taking differently: Take 50 mg by mouth at bedtime. ) 90 tablet 3  . montelukast (SINGULAIR) 10 MG tablet Take 1 tablet (10 mg total) by mouth at bedtime. (Patient taking differently: Take 10 mg by mouth every morning. ) 30 tablet 11  . PARoxetine (PAXIL) 20 MG tablet Take 20 mg by mouth at bedtime.     . vitamin B-12 (CYANOCOBALAMIN) 250 MCG tablet Take 250 mcg by mouth at bedtime.      No current facility-administered medications for this visit.    Facility-Administered Medications Ordered in Other Visits  Medication Dose Route Frequency Provider Last Rate Last Dose  . tranexamic acid (CYKLOKAPRON) 2,000 mg in sodium chloride 0.9 % 50 mL Topical Application  2,585 mg Topical Once [employer], PA-C        Allergies:   Bee venom; Ceclor [cefaclor]; Penicillins; Pneumococcal vaccines; Codeine; Cortisone; and Boniva [ibandronate sodium]   Social History:  The patient  reports that she quit smoking about 9 months ago. Her smoking use included Cigarettes. She has a 25.00 pack-year smoking history. She quit smokeless tobacco use about 4 years ago. Her smokeless tobacco use included Snuff and Chew. She reports that she drinks alcohol. She reports that she does not use drugs.     Family History:  The patient's family history includes Breast cancer in her maternal aunt, maternal grandmother, and mother; Colon cancer (age of onset: 15) in her brother; Colon polyps in her brother; Diabetes in her maternal grandfather; Heart attack in her father; Heart failure in her brother and father; Stroke in her mother; Transient ischemic attack in her mother.    ROS:  Please see the history of present illness.   Otherwise, review of systems is positive for Appetite change, chills, chest pain, shortness of breath, palpitations, cough, dyspnea on exertion, snoring, balance problems.   All other systems are reviewed and negative.    PHYSICAL EXAM: VS:  BP 112/68   Pulse (!) 52   Ht 5' 0.5" (1.537 m)   Wt 180 lb 12.8 oz (82 kg)   SpO2 98%   BMI 34.73 kg/m  , BMI Body mass index is 34.73 kg/m. GEN: Well nourished, well developed, in no acute distress  HEENT: normal  Neck: no JVD, carotid bruits, or masses Cardiac: RRR; no murmurs, rubs, or gallops,no edema  Respiratory:  clear to auscultation bilaterally, normal work of breathing GI: soft, nontender, nondistended, + BS MS: no deformity or atrophy  Skin: warm and dry Neuro:  Strength and sensation are intact Psych: euthymic mood, full affect  EKG:  EKG is ordered today. Personal review of the ekg ordered shows sinus rhythm, rate 52  Recent Labs: 02/25/2016: Magnesium 2.2; TSH 3.919 04/27/2016: ALT 27 07/28/2016: BUN 9; Creatinine, Ser 0.70; Sodium 141 07/29/2016: B Natriuretic Peptide 24.1; Brain Natriuretic Peptide CANCELED; Hemoglobin 12.2; Platelets 191 08/03/2016: Potassium 3.2    Lipid Panel     Component Value Date/Time   CHOL 127 02/25/2016 0313   CHOL 129 12/07/2012 1445   TRIG 173 (H) 02/25/2016 0313   TRIG 147 12/07/2012 1445   HDL 25 (L) 02/25/2016 0313   HDL 40 12/07/2012 1445   CHOLHDL 5.1 02/25/2016 0313   VLDL 35 02/25/2016 0313   LDLCALC 67 02/25/2016 0313   LDLCALC 60 12/07/2012 1445     Wt  Readings from Last 3 Encounters:  10/27/16 180 lb 12.8 oz (82 kg)  09/05/16 178 lb 9.6 oz (81 kg)  08/04/16 174 lb 2.6 oz (79 kg)      Other studies Reviewed: Additional studies/ records that were reviewed today include: TTE 02/22/16  Review of the above records today demonstrates:  - Left ventricle: The cavity size was normal. Systolic function was   normal. The estimated ejection fraction was in the range of 55%   to 60%. Left ventricular diastolic function parameters were   normal.  02/23/16 SPECT  The left ventricular ejection fraction is normal (55-65%).  Nuclear stress EF: 60%.  There was no ST segment deviation noted during stress.  No T wave inversion was noted during stress.  Defect 1: There is a small defect of moderate severity present in the apical anterior and apex location. Consistent with breast attenuation and unchanged from 2015.  The study is normal.  This is a low risk study.   ASSESSMENT AND PLAN:  1.  Paroxysmal atrial fibrillation: on Eliquis. AF ablation on 08/03/16. She remains in sinus rhythm today off her flecainide. She is having some fatigue that might be due to bradycardia. We'll stop her carvedilol today.  This patients CHA2DS2-VASc Score and unadjusted Ischemic Stroke Rate (% per year) is equal to 3.2 % stroke rate/year from a score of 3  Above score calculated as 1 point each if present [CHF, HTN, DM, Vascular=MI/PAD/Aortic Plaque, Age if 65-74, or Female] Above score calculated as 2 points each if present [Age > 75, or Stroke/TIA/TE]  2. Hypertension: well controlled today   3. 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 Her EF has improved. That being said, she is having more issues with shortness of breath. Due to that, we'll check a BNP today and get a TTE.  4. Chest pain: Currently her pain has some very atypical features. She has had a normal stress test in the past. Due to the typical features of her pain, we'll try her on a  course of Imdur. Should she continue to have chest pain Ray Glacken plan for left heart catheterization. She Dayrin Stallone call us back in 2 weeks and let us know if the Imdur has improved her symptoms.  Current medicines are reviewed at length with the patient today.   The patient does not have concerns regarding her medicines.  The following changes were made today:  Stop coreg, start Imdur  Labs/ tests ordered today include:  Orders Placed This Encounter  Procedures  . EKG 12-Lead     Disposition:   FU with Matthan Sledge 3 months  Signed, Jaida Basurto Meredith Leeds, MD  10/27/2016 3:01 PM     [address] [place] [address] [phone] (office) [phone] (fax)

## 2016-10-27 NOTE — Addendum Note (Signed)
Addended by: Sandrea Hammond D on: 10/27/2016 03:12 PM   Modules accepted: Orders

## 2016-10-27 NOTE — Patient Instructions (Addendum)
Medication Instructions: 1) Stop taking Coreg  2) Start Imdur 30 mg daily  Labwork: None ordered  Procedures/Testing: None ordered  Follow-Up: Your physician recommends that you schedule a follow-up appointment in 3 months with Dr. Curt Bears   Any Additional Special Instructions Will Be Listed Below (If Applicable).   Isosorbide Mononitrate extended-release tablets What is this medicine? ISOSORBIDE MONONITRATE (eye soe SOR bide mon oh NYE trate) is a vasodilator. It relaxes blood vessels, increasing the blood and oxygen supply to your heart. This medicine is used to prevent chest pain caused by angina. It will not help to stop an episode of chest pain. This medicine may be used for other purposes; ask your health care provider or pharmacist if you have questions. COMMON BRAND NAME(S): Imdur, Isotrate ER What should I tell my health care provider before I take this medicine? They need to know if you have any of these conditions: -previous heart attack or heart failure -an unusual or allergic reaction to isosorbide mononitrate, nitrates, other medicines, foods, dyes, or preservatives -pregnant or trying to get pregnant -breast-feeding How should I use this medicine? Take this medicine by mouth with a glass of water. Follow the directions on the prescription label. Do not crush or chew. Take your medicine at regular intervals. Do not take your medicine more often than directed. Do not stop taking this medicine except on the advice of your doctor or health care professional. Talk to your pediatrician regarding the use of this medicine in children. Special care may be needed. Overdosage: If you think you have taken too much of this medicine contact a poison control center or emergency room at once. NOTE: This medicine is only for you. Do not share this medicine with others. What if I miss a dose? If you miss a dose, take it as soon as you can. If it is almost time for your next dose, take  only that dose. Do not take double or extra doses. What may interact with this medicine? Do not take this medicine with any of the following medications: -medicines used to treat erectile dysfunction (ED) like avanafil, sildenafil, tadalafil, and vardenafil -riociguat This medicine may also interact with the following medications: -medicines for high blood pressure -other medicines for angina or heart failure This list may not describe all possible interactions. Give your health care provider a list of all the medicines, herbs, non-prescription drugs, or dietary supplements you use. Also tell them if you smoke, drink alcohol, or use illegal drugs. Some items may interact with your medicine. What should I watch for while using this medicine? Check your heart rate and blood pressure regularly while you are taking this medicine. Ask your doctor or health care professional what your heart rate and blood pressure should be and when you should contact him or her. Tell your doctor or health care professional if you feel your medicine is no longer working. You may get dizzy. Do not drive, use machinery, or do anything that needs mental alertness until you know how this medicine affects you. To reduce the risk of dizzy or fainting spells, do not sit or stand up quickly, especially if you are an older patient. Alcohol can make you more dizzy, and increase flushing and rapid heartbeats. Avoid alcoholic drinks. Do not treat yourself for coughs, colds, or pain while you are taking this medicine without asking your doctor or health care professional for advice. Some ingredients may increase your blood pressure. What side effects may I notice from receiving  this medicine? Side effects that you should report to your doctor or health care professional as soon as possible: -bluish discoloration of lips, fingernails, or palms of hands -irregular heartbeat, palpitations -low blood pressure -nausea,  vomiting -persistent headache -unusually weak or tired Side effects that usually do not require medical attention (report to your doctor or health care professional if they continue or are bothersome): -flushing of the face or neck -rash This list may not describe all possible side effects. Call your doctor for medical advice about side effects. You may report side effects to FDA at 1-800-FDA-1088. Where should I keep my medicine? Keep out of the reach of children. Store between 15 and 30 degrees C (59 and 86 degrees F). Keep container tightly closed. Throw away any unused medicine after the expiration date. NOTE: This sheet is a summary. It may not cover all possible information. If you have questions about this medicine, talk to your doctor, pharmacist, or health care provider.  2018 Elsevier/Gold Standard (2013-05-17 14:48:19)     If you need a refill on your cardiac medications before your next appointment, please call your pharmacy.

## 2016-11-07 ENCOUNTER — Ambulatory Visit (INDEPENDENT_AMBULATORY_CARE_PROVIDER_SITE_OTHER)
Admission: RE | Admit: 2016-11-07 | Discharge: 2016-11-07 | Disposition: A | Discharge status: Home / Self Care | Payer: Medicaid Other | Source: Ambulatory Visit | Attending: Internal Medicine | Admitting: Internal Medicine

## 2016-11-07 ENCOUNTER — Encounter: Payer: Self-pay | Admitting: Internal Medicine

## 2016-11-07 ENCOUNTER — Ambulatory Visit (INDEPENDENT_AMBULATORY_CARE_PROVIDER_SITE_OTHER): Payer: Medicaid Other | Admitting: Internal Medicine

## 2016-11-07 ENCOUNTER — Other Ambulatory Visit: Payer: Self-pay | Admitting: Internal Medicine

## 2016-11-07 VITALS — BP 124/78 | HR 53 | Ht 60.5 in | Wt 182.0 lb

## 2016-11-07 DIAGNOSIS — J449 Chronic obstructive pulmonary disease, unspecified: Secondary | ICD-10-CM | POA: Diagnosis not present

## 2016-11-07 DIAGNOSIS — J3089 Other allergic rhinitis: Secondary | ICD-10-CM

## 2016-11-07 DIAGNOSIS — J302 Other seasonal allergic rhinitis: Secondary | ICD-10-CM | POA: Diagnosis not present

## 2016-11-07 MED ORDER — DOXYCYCLINE HYCLATE 100 MG PO TABS: ORAL_TABLET | ORAL | 0 refills | Status: DC

## 2016-11-07 MED ORDER — GLYCOPYRROLATE-FORMOTEROL 9-4.8 MCG/ACT IN AERO: 2.0000 | INHALATION_SPRAY | Freq: Two times a day (BID) | RESPIRATORY_TRACT | 12 refills | Status: DC

## 2016-11-07 MED ORDER — LEVALBUTEROL TARTRATE 45 MCG/ACT IN AERO: INHALATION_SPRAY | RESPIRATORY_TRACT | 12 refills | Status: DC

## 2016-11-07 MED ORDER — LEVALBUTEROL HCL 0.63 MG/3ML IN NEBU
0.6300 mg | INHALATION_SOLUTION | Freq: Once | RESPIRATORY_TRACT | Status: AC
  Administered 2016-11-07: 0.63 mg via RESPIRATORY_TRACT

## 2016-11-07 MED ORDER — GLYCOPYRROLATE-FORMOTEROL 9-4.8 MCG/ACT IN AERO: 2.0000 | INHALATION_SPRAY | Freq: Two times a day (BID) | RESPIRATORY_TRACT | 0 refills | Status: DC

## 2016-11-07 NOTE — Progress Notes (Signed)
HPI female former smoker followed for chronic bronchitis, allergic rhinitis, complicated by DM 2, history CVA, A. Fib/ CM/CHF PFT 11/05/12 NPSG 05/17/16- WNL AHI 2.2/ hr Echocardiogram 08/10/2016 EF 08-14%, Gr 2 diastolic dysfunction Office Spirometry 11/07/2016-moderate obstructive airways disease. FVC 1.95/70%, FEV1 1.43/67%, ratio 0.73, FEF 25-75% 0.99/49% --------------------------------------------------------------------------------------------------------------   05/09/2016-66 year old female former smoker followed for chronic bronchitis, allergic rhinitis, complicated by DM 2, history CVA, A. Fib FOLLOWS FOR: Pt states she continues to have "right smart" amount of cough but thinks its due to allergies; non productive mostly. "cut and dry cough" Dry irritated feeling in her throat. No phlegm. Feeling comes and goes. No Dysphagia CT chest 02/24/2016-NAD, scarring/atelectasis left base mild atherosclerosis  11/07/2016- 65 year old female former smoker followed for chronic bronchitis, allergic rhinitis, complicated by DM 2, history CVA, A. Fib/CM/CHF FOLLOWS FOR: Pt states she is unsure if increased SOB is coming from her heart or lungs-past 4 months.  Here with husband who also cough she denies recent cold but admits to some pollen drainage. Quit smoking one year ago. She has been using Zyrtec and Flonase were to work adequately for control and postnasal drip. We discussed her cardiology status with atrial fib/CHF. She can feel the palpitation and increased shortness of breath when she goes into atrial fibrillation. Doesn't like albuterol rescue inhaler which makes her feel nervous and may increase her heart rate. Continues Symbicort Office Spirometry 11/07/2016-moderate obstructive airways disease. FVC 1.95/70%, FEV1 1.43/67%, ratio 0.73, FEF 25-75% 0.99/49% She was wheezy after the spirometry effort and needed her nebulizer treatment with Xopenex.  ROS-see HPI Constitutional:   No-   weight loss,  night sweats, fevers, chills, fatigue, lassitude. HEENT:   +  headaches, difficulty swallowing, tooth/dental problems, sore throat,       No-  Sneezing, itching, no-ear ache, +nasal congestion, +post nasal drip,  CV:  No-   chest pain, orthopnea, PND, swelling in lower extremities, anasarca, dizziness, +palpitations Resp: +  shortness of breath with exertion or at rest.              No-   productive cough,  + non-productive cough,  No- coughing up of blood.              No-   change in color of mucus.   wheezing.   Skin: No- rash or lesions. GI:  No- heartburn, indigestion, no-abdominal pain, nausea, vomiting,  GU: . MS:  + joint pain or swelling.   Neuro-     nothing unusual Psych:  No- change in mood or affect. + depression or anxiety.  No memory loss.  OBJ- Physical Exam General- Alert, Oriented, Affect-appropriate, Distress- none acute. + Overweight Skin- No rash Lymphadenopathy- none Head- atraumatic            Eyes- Gross vision intact, PERRLA, conjunctivae and secretions clear            Ears- Hearing, canals-normal            Nose- Clear, no-Septal dev, mucus, polyps, erosion, perforation             Throat- Mallampati III-IV , mucosa clear , drainage- none, tonsils- atrophic, + dentures Neck- flexible , trachea midline, no stridor , thyroid nl, carotid no bruit Chest - symmetrical excursion , unlabored           Heart/CV- RRR c/w RSR today , no murmur , no gallop  , no rub, nl s1 s2                           -  JVD- none , edema- none, stasis changes- none, varices- none           Lung-  Cough+ dry paroxysmal with deep breath , dullness-none, rub- none, +wheezy spirometry effort today            Chest wall-  Abd-  Br/ Gen/ Rectal- Not done, not indicated Extrem- cyanosis- none, clubbing, none, atrophy- none, strength- nl Neuro- grossly intact to observation

## 2016-11-07 NOTE — Assessment & Plan Note (Signed)
She feels overstimulated by albuterol rescue inhaler. We will try changing to Xopenex. Not clear that she benefits from the steroid component of Symbicort. Try changing to LABA/LAMA Bevespi.

## 2016-11-07 NOTE — Patient Instructions (Signed)
Script for xopenex rescue inhaler    Inhale 2 puffs every 6 hours if needed, instead of albuterol  Sample and script for Bevespi maintenance inhaler    Inhale 2 puffs, twice daily      Try this instead of Symbicort  Order- CXR    Dx  COPD mixed type  Order- office spirometry   " "

## 2016-11-07 NOTE — Assessment & Plan Note (Signed)
She notices postnasal drainage but feels Flonase and Zyrtec are adequate for this

## 2016-11-15 ENCOUNTER — Telehealth: Payer: Self-pay | Admitting: Internal Medicine

## 2016-11-15 MED ORDER — AZITHROMYCIN 250 MG PO TABS: ORAL_TABLET | ORAL | 0 refills | Status: DC

## 2016-11-15 MED ORDER — TIOTROPIUM BROMIDE MONOHYDRATE 18 MCG IN CAPS: 18.0000 ug | ORAL_CAPSULE | Freq: Every day | RESPIRATORY_TRACT | 6 refills | Status: DC

## 2016-11-15 NOTE — Telephone Encounter (Signed)
CY  Please Advise-  Called to initiated PA for pt's Bevespi, was told by insurance pt will need to try and fail Spiriva Handihaler

## 2016-11-15 NOTE — Telephone Encounter (Signed)
Pt aware of CY below message.  Rx sent to preferred preferred pharmacy. Nothing further needed.

## 2016-11-15 NOTE — Telephone Encounter (Signed)
Sorry- 1 cap daily, # 30    Thanks for catching me

## 2016-11-15 NOTE — Telephone Encounter (Signed)
Called [place] at [phone] to initiate PA for Xopenex. PA was approved with dates 11/15/2016-11/10/2017.  Reference number [id number])

## 2016-11-15 NOTE — Telephone Encounter (Signed)
Spoke with pt, who is requesting another round of abx. Pt states she completed course of doxycycline on Friday that CY prescribed on 11/07/16. Pt states she noticed mild improvement with abx. Pt reports of chest tightness, prod cough with green mucus, mild wheezing, chills & rib pain.  CY please advise. Thanks.

## 2016-11-15 NOTE — Telephone Encounter (Signed)
Offer Zpak    250 mg, # 6, 2 today then one daily 

## 2016-11-15 NOTE — Telephone Encounter (Signed)
Spoke with pt and made her aware of CY's message. Pt understood and agreed to the medication being called in. The rx was sent.  Nothing further is needed

## 2016-11-15 NOTE — Telephone Encounter (Signed)
CY I wanted to make sure that you wanted the pt to do the spiriva handihaler 1 dose twice daily?  Please advise. thanks

## 2016-11-15 NOTE — Telephone Encounter (Signed)
Ok - replace Beves[pi with Spiriva handihaler    Inhale 1 dose twice daily    Ref x 12

## 2016-12-07 ENCOUNTER — Ambulatory Visit (HOSPITAL_COMMUNITY): Admission: RE | Admit: 2016-12-07 | Payer: Medicaid Other | Source: Ambulatory Visit

## 2016-12-16 ENCOUNTER — Ambulatory Visit (HOSPITAL_COMMUNITY)
Admission: RE | Admit: 2016-12-16 | Discharge: 2016-12-16 | Disposition: A | Discharge status: Home / Self Care | Payer: Medicaid Other | Source: Ambulatory Visit | Attending: Internal Medicine | Admitting: Internal Medicine

## 2016-12-16 ENCOUNTER — Encounter (HOSPITAL_COMMUNITY): Payer: Self-pay

## 2016-12-16 DIAGNOSIS — M810 Age-related osteoporosis without current pathological fracture: Secondary | ICD-10-CM | POA: Diagnosis present

## 2016-12-16 MED ORDER — ZOLEDRONIC ACID 5 MG/100ML IV SOLN
5.0000 mg | Freq: Once | INTRAVENOUS | Status: AC
  Administered 2016-12-16: 5 mg via INTRAVENOUS
  Filled 2016-12-16: qty 100

## 2016-12-16 MED ORDER — SODIUM CHLORIDE 0.9 % IV SOLN
Freq: Once | INTRAVENOUS | Status: AC
  Administered 2016-12-16: 12:00:00 via INTRAVENOUS

## 2016-12-16 NOTE — Discharge Instructions (Signed)

## 2016-12-16 NOTE — Progress Notes (Signed)
D/c instructions given to pt on Reclast.  Pt takes daily vitamin D and calcium.  Informed pt to keep taking unless MD instructs otherwise.  Pt voiced understanding.  Pt was d/c ambulatory with husband to lobby.

## 2016-12-30 NOTE — Anesthesia Postprocedure Evaluation (Signed)
Anesthesia Post Note  Patient: [name] [name]  Procedure(s) Performed: Procedure(s) (LRB): Atrial Fibrillation Ablation (N/A)     Anesthesia Post Evaluation  Last Vitals:  Vitals:   08/04/16 0452 08/04/16 0903  BP: (!) 133/51 (!) 102/40  Pulse: 67 71  Resp: (!) 22 (!) 26  Temp: 37.1 C 36.8 C    Last Pain:  Vitals:   08/04/16 0903  TempSrc: Oral  PainSc:                  Velda Wendt,JAMES TERRILL

## 2016-12-30 NOTE — Addendum Note (Signed)
Addendum  created 12/30/16 0827 by Rica Koyanagi, MD   Sign clinical note

## 2017-01-24 ENCOUNTER — Encounter: Payer: Self-pay | Admitting: Cardiology

## 2017-01-24 ENCOUNTER — Ambulatory Visit (INDEPENDENT_AMBULATORY_CARE_PROVIDER_SITE_OTHER): Payer: Medicaid Other | Admitting: Cardiology

## 2017-01-24 VITALS — BP 106/68 | HR 62 | Ht 60.5 in | Wt 184.2 lb

## 2017-01-24 DIAGNOSIS — I10 Essential (primary) hypertension: Secondary | ICD-10-CM | POA: Diagnosis not present

## 2017-01-24 DIAGNOSIS — I480 Paroxysmal atrial fibrillation: Secondary | ICD-10-CM

## 2017-01-24 DIAGNOSIS — R5383 Other fatigue: Secondary | ICD-10-CM | POA: Diagnosis not present

## 2017-01-24 MED ORDER — FUROSEMIDE 20 MG PO TABS: 20.0000 mg | ORAL_TABLET | Freq: Every day | ORAL | 1 refills | Status: DC

## 2017-01-24 NOTE — Progress Notes (Signed)
Electrophysiology Office Note   Date:  01/24/2017   ID:  [name] [name], [name] [name] [birth year], MRN [id number]  PCP:  Velna Hatchet, MD  Cardiologist:  Nahser Primary Electrophysiologist:  Dariya Gainer Meredith Leeds, MD    Chief Complaint  Patient presents with  . Follow-up    PAF     History of Present Illness: [name] [name] is a 66 y.o. female who presents today for electrophysiology evaluation.   Hx paroxysmal atrial fibrillaiton, Takotsubo, HLD. Her EF has normalized from a low of 15-20%. She was admitted to the hospital in July with chest pain, and was found to have atrial fibrillation with RVR. She had a stress test which was a low risk study. An echo at the time showed that her EF was normal. Since being seen, she has been maintained on flecainide. She has been compliant with her anticoagulation. Ablation for atrial fibrillation on 08/03/16. She is feeling well without further episodes of palpitations. She has minor amounts of chest pain that are well controlled with her isosorbide.  Today, denies symptoms of palpitations, chest pain, shortness of breath, orthopnea, PND, lower extremity edema, claudication, dizziness, presyncope, syncope, bleeding, or neurologic sequela. The patient is tolerating medications without difficulties. He does complain of fatigue. She says that she is tired most of the day. She goes to bed around 1 in the morning and gets up around noon. She takes her Lasix) the time that she wakes up, but does drink quite a bit of fluid throughout the day, and has a diet Pepsi beside her bed when she is sleeping. She says that she wakes up multiple times at night to use the restroom.   Past Medical History:  Diagnosis Date  . Anemia    hx of years ago   . Anxiety   . Aortic valve disorders   . Arthritis   . Asthma   . Barrett esophagus   . Bipolar disorder ([place])   . CHF (congestive heart failure) ([place])   . Chronic bronchitis ([place])   . Chronic kidney disease    "I  don't have but 1" (08/03/2016)  . COPD (chronic obstructive pulmonary disease) ([place])    "just have a touch" (08/03/2016)  . Depression   . Esophageal reflux   . Esophageal stricture   . Fibromyalgia    "hands, knees, elbows; back; think it's in my hips" (08/03/2016)  . GAD (generalized anxiety disorder)   . GERD (gastroesophageal reflux disease)   . Hiatal hernia   . History of blood transfusion 1970s; 01/08/2013   "low blood count"  . History of kidney stones 01-08-13   past hx.  . History of stomach ulcers    "some have bleed"  . Hyperlipemia   . Hypertension 12/07/2012  . Insomnia, unspecified   . Myocardial infarction [facility]) 01-08-13   '06-Chest pain-no stent-dx. MI-stress related  . Osteoporosis   . Personal history of colonic polyps 09/2010   TUBULAR ADENOMAS (X3); NEGATIVE FOR HIGH GRADE DYSPLASIA OR MALIGNANCY.  [name] Pneumonia    "several times" (08/03/2016)  . Stroke [facility])    "2 or 3"; remains with some right sided weaknes (08/03/2016)  . Type II diabetes mellitus ([place])   . Urinary frequency    AND INCONTINENCE   Past Surgical History:  Procedure Laterality Date  . ATRIAL FIBRILLATION ABLATION  08/03/2016  . CARDIAC CATHETERIZATION     normal per Dr Acie Fredrickson in note dated 02/06/12   . CARDIAC CATHETERIZATION     "I've had  2 or 3" (08/03/2016)  . CARPAL TUNNEL RELEASE Right   . CATARACT EXTRACTION W/ INTRAOCULAR LENS  IMPLANT, BILATERAL    . COLONOSCOPY W/ BIOPSIES AND POLYPECTOMY    . DILATION AND CURETTAGE OF UTERUS     S/P miscarriage  . ELECTROPHYSIOLOGIC STUDY N/A 08/03/2016   Procedure: Atrial Fibrillation Ablation;  Surgeon: Bintou Lafata Meredith Leeds, MD;  Location: [facility];  Service: Cardiovascular;  Laterality: N/A;  . ESOPHAGEAL DILATION  01-08-13   2 yrs ago  . HEEL SPUR SURGERY Left 2005  . KNEE ARTHROSCOPY  02/28/2012   Procedure: ARTHROSCOPY KNEE;  Surgeon: Tobi Bastos, MD;  Location: [facility];  Service: Orthopedics;  Laterality: Left;  . LAPAROSCOPIC  CHOLECYSTECTOMY  03/2002  . SHOULDER OPEN ROTATOR CUFF REPAIR Right 2011  . TONSILLECTOMY AND ADENOIDECTOMY  1965  . TOTAL KNEE ARTHROPLASTY Left 01/17/2013   Procedure: LEFT TOTAL KNEE ARTHROPLASTY;  Surgeon: Tobi Bastos, MD;  Location: [facility];  Service: Orthopedics;  Laterality: Left;  . TOTAL KNEE ARTHROPLASTY Right 06/19/2014   Procedure: RIGHT TOTAL KNEE ARTHROPLASTY;  Surgeon: Tobi Bastos, MD;  Location: [facility];  Service: Orthopedics;  Laterality: Right;  . TUBAL LIGATION    . VAGINAL HYSTERECTOMY     "partial"  . WRIST FLEXION TENDON TENOTOMIES AND PROXIMAL CORPECTOMY W/ WRIST ARTHRODESIS&ILIAC CREST BONE GRAFT       Current Outpatient Prescriptions  Medication Sig Dispense Refill  . acetaminophen (TYLENOL) 325 MG tablet Take 1-2 tablets (325-650 mg total) by mouth every 4 (four) hours as needed for mild pain.    [name] albuterol (PROVENTIL HFA;VENTOLIN HFA) 108 (90 Base) MCG/ACT inhaler Inhale 2 puffs into the lungs every 4 (four) hours as needed for wheezing or shortness of breath. 3 Inhaler 3  . albuterol (PROVENTIL) (2.5 MG/3ML) 0.083% nebulizer solution Take 3 mLs (2.5 mg total) by nebulization every 6 (six) hours as needed for wheezing. DX  491.9 360 mL 12  . ALPRAZolam (XANAX) 0.5 MG tablet Take 0.5 mg by mouth 3 (three) times daily as needed for anxiety or sleep.     [name] apixaban (ELIQUIS) 5 MG TABS tablet Take 1 tablet (5 mg total) by mouth 2 (two) times daily. 60 tablet 11  . atorvastatin (LIPITOR) 40 MG tablet Take 1 tablet (40 mg total) by mouth at bedtime. 30 tablet 0  . azelastine (ASTELIN) 0.1 % nasal spray Place 1 spray into both nostrils 2 (two) times daily. Use in each nostril as directed    . benzonatate (TESSALON) 200 MG capsule TAKE 1 CAPSULE 3 TIMES DAILY AS NEEDED FOR COUGH 15 capsule 2  . bismuth subsalicylate (PEPTO BISMOL) 262 MG/15ML suspension Take 30 mLs by mouth every 6 (six) hours as needed for diarrhea or loose stools.    . budesonide-formoterol  (SYMBICORT) 160-4.5 MCG/ACT inhaler Inhale 2 puffs into the lungs 2 (two) times daily. 1 Inhaler 12  . calcium carbonate (OS-CAL) 600 MG TABS Take 600 mg by mouth at bedtime.     . cholecalciferol (VITAMIN D) 1000 UNITS tablet Take 1,000 Units by mouth at bedtime.     [name] doxycycline (VIBRA-TABS) 100 MG tablet Take 2 tablets today,then 1 daily until gone 8 tablet 0  . EPIPEN 2-PAK 0.3 MG/0.3ML SOAJ injection USE AS DIRECTED 1 Device 6  . ferrous sulfate 325 (65 FE) MG tablet Take 1 tablet (325 mg total) by mouth 2 (two) times daily. 60 tablet 1  . fluticasone (FLONASE) 50 MCG/ACT nasal spray Place 2  sprays into the nose daily. (Patient taking differently: Place 2 sprays into the nose 2 (two) times daily. ) 16 g prn  . Glycopyrrolate-Formoterol (BEVESPI AEROSPHERE) 9-4.8 MCG/ACT AERO Inhale 2 puffs into the lungs 2 (two) times daily. 10.7 g 12  . Glycopyrrolate-Formoterol (BEVESPI AEROSPHERE) 9-4.8 MCG/ACT AERO Inhale 2 puffs into the lungs 2 (two) times daily. 1 Inhaler 0  . isosorbide mononitrate (IMDUR) 30 MG 24 hr tablet Take 1 tablet (30 mg total) by mouth daily. 90 tablet 3  . levalbuterol (XOPENEX HFA) 45 MCG/ACT inhaler Inhale 2 puffs every 6 hours- rescue 1 Inhaler 12  . losartan (COZAAR) 50 MG tablet Take 1 tablet (50 mg total) by mouth daily. (Patient taking differently: Take 50 mg by mouth at bedtime. ) 90 tablet 3  . montelukast (SINGULAIR) 10 MG tablet Take 1 tablet (10 mg total) by mouth at bedtime. (Patient taking differently: Take 10 mg by mouth every morning. ) 30 tablet 11  . PARoxetine (PAXIL) 20 MG tablet Take 20 mg by mouth at bedtime.     [name] tiotropium (SPIRIVA) 18 MCG inhalation capsule Place 1 capsule (18 mcg total) into inhaler and inhale daily. 30 capsule 6  . vitamin B-12 (CYANOCOBALAMIN) 250 MCG tablet Take 250 mcg by mouth at bedtime.      No current facility-administered medications for this visit.    Facility-Administered Medications Ordered in Other Visits  Medication  Dose Route Frequency Provider Last Rate Last Dose  . tranexamic acid (CYKLOKAPRON) 2,000 mg in sodium chloride 0.9 % 50 mL Topical Application  7,026 mg Topical Once Cecilio Asper, [employer], PA-C        Allergies:   Bee venom; Ceclor [cefaclor]; Penicillins; Pneumococcal vaccines; Codeine; Cortisone; and Boniva [ibandronate sodium]   Social History:  The patient  reports that she quit smoking about 12 months ago. Her smoking use included Cigarettes. She has a 25.00 pack-year smoking history. She quit smokeless tobacco use about 5 years ago. Her smokeless tobacco use included Snuff and Chew. She reports that she drinks alcohol. She reports that she does not use drugs.   Family History:  The patient's family history includes Breast cancer in her maternal aunt, maternal grandmother, and mother; Colon cancer (age of onset: 79) in her brother; Colon polyps in her brother; Diabetes in her maternal grandfather; Heart attack in her father; Heart failure in her brother and father; Stroke in her mother; Transient ischemic attack in her mother.    ROS:  Please see the history of present illness.   Otherwise, review of systems is positive for chest pressure, cough.   All other systems are reviewed and negative.     PHYSICAL EXAM: VS:  BP 106/68   Pulse 62   Ht 5' 0.5" (1.537 m)   Wt 184 lb 3.2 oz (83.6 kg)   BMI 35.38 kg/m  , BMI Body mass index is 35.38 kg/m. GEN: Well nourished, well developed, in no acute distress  HEENT: normal  Neck: no JVD, carotid bruits, or masses Cardiac: RRR; no murmurs, rubs, or gallops,no edema  Respiratory:  clear to auscultation bilaterally, normal work of breathing GI: soft, nontender, nondistended, + BS MS: no deformity or atrophy  Skin: warm and dry Neuro:  Strength and sensation are intact Psych: euthymic mood, full affect  EKG:  EKG is not ordered today. Personal review of the ekg ordered 10/27/16 shows sinus rhythm, iLBBB, rate 52  Recent Labs: 02/25/2016:  Magnesium 2.2; TSH 3.919 04/27/2016: ALT 27 07/28/2016: BUN 9; Creatinine,  Ser 0.70; Sodium 141 07/29/2016: B Natriuretic Peptide 24.1; Brain Natriuretic Peptide CANCELED; Hemoglobin 12.2; Platelets 191 08/03/2016: Potassium 3.2    Lipid Panel     Component Value Date/Time   CHOL 127 02/25/2016 0313   CHOL 129 12/07/2012 1445   TRIG 173 (H) 02/25/2016 0313   TRIG 147 12/07/2012 1445   HDL 25 (L) 02/25/2016 0313   HDL 40 12/07/2012 1445   CHOLHDL 5.1 02/25/2016 0313   VLDL 35 02/25/2016 0313   LDLCALC 67 02/25/2016 0313   LDLCALC 60 12/07/2012 1445     Wt Readings from Last 3 Encounters:  01/24/17 184 lb 3.2 oz (83.6 kg)  12/16/16 183 lb (83 kg)  11/07/16 182 lb (82.6 kg)      Other studies Reviewed: Additional studies/ records that were reviewed today include: TTE 02/22/16  Review of the above records today demonstrates:  - Left ventricle: The cavity size was normal. Systolic function was   normal. The estimated ejection fraction was in the range of 55%   to 60%. Left ventricular diastolic function parameters were   normal.  02/23/16 SPECT  The left ventricular ejection fraction is normal (55-65%).  Nuclear stress EF: 60%.  There was no ST segment deviation noted during stress.  No T wave inversion was noted during stress.  Defect 1: There is a small defect of moderate severity present in the apical anterior and apex location. Consistent with breast attenuation and unchanged from 2015.  The study is normal.  This is a low risk study.   ASSESSMENT AND PLAN:  1.  Paroxysmal atrial fibrillation: On Eliquis. AF ablation on 08/03/16. Remains in sinus rhythm today. No changes at this time.  This patients CHA2DS2-VASc Score and unadjusted Ischemic Stroke Rate (% per year) is equal to 3.2 % stroke rate/year from a score of 3  Above score calculated as 1 point each if present [CHF, HTN, DM, Vascular=MI/PAD/Aortic Plaque, Age if 65-74, or Female] Above score calculated  as 2 points each if present [Age > 75, or Stroke/TIA/TE]  2. Hypertension: Well-controlled today.  3. takotsubo cardiomyopathy: Had to stop carvedilol due to fatigue. Remains on Lasix and an ARB.  4. Chest pain: Much improved. No workup necessary at this time.  5. Fatigue: Likely due to poor sleep hygiene as she wakes up multiple times to use the restroom. She is drinking quite a bit of fluid throughout the day, and is possibly due to her Lasix in the morning. We'll decrease her Lasix to 20 mg, and have asked her to hold off on drinking as much fluid to improve sleep hygiene.  Current medicines are reviewed at length with the patient today.   The patient does not have concerns regarding her medicines.  The following changes were made today:  Decrease lasix dose  Labs/ tests ordered today include:  No orders of the defined types were placed in this encounter.    Disposition:   FU with Francisca Langenderfer 6 months  Signed, Yaritzel Stange Meredith Leeds, MD  01/24/2017 11:59 AM     [facility] [address] [place] [address] [phone] (office) [phone] (fax)

## 2017-01-24 NOTE — Patient Instructions (Signed)
Medication Instructions: Decrease Furosemide to 20 mg once daily.  Labwork: None ordered  Procedures/Testing: None ordered   Follow-Up: Your physician recommends that you schedule a follow-up appointment in: 6 months with Dr. Curt Bears.  Any Additional Special Instructions Will Be Listed Below (If Applicable).     If you need a refill on your cardiac medications before your next appointment, please call your pharmacy.

## 2017-02-19 ENCOUNTER — Other Ambulatory Visit: Payer: Self-pay | Admitting: Cardiology

## 2017-03-01 ENCOUNTER — Other Ambulatory Visit (HOSPITAL_COMMUNITY): Payer: Medicaid Other

## 2017-03-02 ENCOUNTER — Other Ambulatory Visit: Payer: Self-pay

## 2017-03-02 ENCOUNTER — Ambulatory Visit (HOSPITAL_COMMUNITY): Payer: Medicaid Other | Attending: Cardiovascular Disease

## 2017-03-02 ENCOUNTER — Other Ambulatory Visit: Payer: Self-pay | Admitting: Internal Medicine

## 2017-03-02 DIAGNOSIS — E119 Type 2 diabetes mellitus without complications: Secondary | ICD-10-CM | POA: Diagnosis not present

## 2017-03-02 DIAGNOSIS — I10 Essential (primary) hypertension: Secondary | ICD-10-CM | POA: Insufficient documentation

## 2017-03-02 DIAGNOSIS — I071 Rheumatic tricuspid insufficiency: Secondary | ICD-10-CM | POA: Insufficient documentation

## 2017-03-02 DIAGNOSIS — E785 Hyperlipidemia, unspecified: Secondary | ICD-10-CM | POA: Insufficient documentation

## 2017-03-02 DIAGNOSIS — Z87891 Personal history of nicotine dependence: Secondary | ICD-10-CM | POA: Insufficient documentation

## 2017-03-02 DIAGNOSIS — I5181 Takotsubo syndrome: Secondary | ICD-10-CM | POA: Insufficient documentation

## 2017-03-02 DIAGNOSIS — I420 Dilated cardiomyopathy: Secondary | ICD-10-CM | POA: Diagnosis not present

## 2017-03-02 DIAGNOSIS — Z8673 Personal history of transient ischemic attack (TIA), and cerebral infarction without residual deficits: Secondary | ICD-10-CM | POA: Insufficient documentation

## 2017-03-02 DIAGNOSIS — R55 Syncope and collapse: Secondary | ICD-10-CM | POA: Diagnosis not present

## 2017-03-02 DIAGNOSIS — I4891 Unspecified atrial fibrillation: Secondary | ICD-10-CM | POA: Insufficient documentation

## 2017-03-06 ENCOUNTER — Telehealth: Payer: Self-pay | Admitting: Cardiology

## 2017-03-06 NOTE — Telephone Encounter (Signed)
New message     Did you received the notes from the echo

## 2017-03-06 NOTE — Telephone Encounter (Signed)
Spoke with patient who called to get her echo results. I advised her that the echo was ordered by Dr. Ardeth Perfect, her PCP and that she should call their office for the results. She verbalized understanding and thanked me for the call.

## 2017-03-09 ENCOUNTER — Encounter: Payer: Self-pay | Admitting: Cardiology

## 2017-03-09 ENCOUNTER — Encounter: Payer: Self-pay | Admitting: Internal Medicine

## 2017-03-09 ENCOUNTER — Ambulatory Visit (INDEPENDENT_AMBULATORY_CARE_PROVIDER_SITE_OTHER): Payer: Medicaid Other | Admitting: Internal Medicine

## 2017-03-09 ENCOUNTER — Ambulatory Visit (INDEPENDENT_AMBULATORY_CARE_PROVIDER_SITE_OTHER): Payer: Medicaid Other | Admitting: Cardiology

## 2017-03-09 VITALS — BP 118/76 | HR 55 | Ht 60.5 in | Wt 181.8 lb

## 2017-03-09 VITALS — BP 108/64 | HR 57 | Ht 60.5 in | Wt 182.6 lb

## 2017-03-09 DIAGNOSIS — I10 Essential (primary) hypertension: Secondary | ICD-10-CM

## 2017-03-09 DIAGNOSIS — I480 Paroxysmal atrial fibrillation: Secondary | ICD-10-CM

## 2017-03-09 DIAGNOSIS — R55 Syncope and collapse: Secondary | ICD-10-CM

## 2017-03-09 DIAGNOSIS — Z23 Encounter for immunization: Secondary | ICD-10-CM

## 2017-03-09 DIAGNOSIS — J449 Chronic obstructive pulmonary disease, unspecified: Secondary | ICD-10-CM

## 2017-03-09 MED ORDER — ALBUTEROL SULFATE HFA 108 (90 BASE) MCG/ACT IN AERS
2.0000 | INHALATION_SPRAY | Freq: Four times a day (QID) | RESPIRATORY_TRACT | 6 refills | Status: DC | PRN
Start: 2017-03-09 — End: 2018-03-12

## 2017-03-09 NOTE — Progress Notes (Signed)
Electrophysiology Office Note   Date:  03/09/2017   ID:  Jazz, Biddy 06-20-1951, MRN [id number]  PCP:  Velna Hatchet, MD  Cardiologist:  Nahser Primary Electrophysiologist:  Will Meredith Leeds, MD    Chief Complaint  Patient presents with  . Follow-up    PAF     History of Present Illness: [name] [name] is a 66 y.o. female who presents today for electrophysiology evaluation.   Hx paroxysmal atrial fibrillaiton, Takotsubo, HLD. Her EF has normalized from a low of 15-20%. She was admitted to the hospital in July with chest pain, and was found to have atrial fibrillation with RVR. She had a stress test which was a low risk study. An echo at the time showed that her EF was normal. Since being seen, she has been maintained on flecainide. She has been compliant with her anticoagulation. AF ablation 08/03/16.  Today, denies symptoms of palpitations, chest pain, shortness of breath, orthopnea, PND, lower extremity edema, claudication, dizziness, presyncope, bleeding, or neurologic sequela. The patient is tolerating medications without difficulties and is otherwise without complaint today. 3 weeks ago, she had an episode of syncope. She was walking to the door, felt dizzy and fell. She did not recently changed position. She also had an episode of syncope while at church in June. Currently she has pain in her face, and significant facial and left chest bruising. She did not note palpitations.    Past Medical History:  Diagnosis Date  . Anemia    hx of years ago   . Anxiety   . Aortic valve disorders   . Arthritis   . Asthma   . Barrett esophagus   . Bipolar disorder ([place])   . CHF (congestive heart failure) ([place])   . Chronic bronchitis ([place])   . Chronic kidney disease    "I don't have but 1" (08/03/2016)  . COPD (chronic obstructive pulmonary disease) ([place])    "just have a touch" (08/03/2016)  . Depression   . Esophageal reflux   . Esophageal stricture   . Fibromyalgia      "hands, knees, elbows; back; think it's in my hips" (08/03/2016)  . GAD (generalized anxiety disorder)   . GERD (gastroesophageal reflux disease)   . Hiatal hernia   . History of blood transfusion 1970s; 01/08/2013   "low blood count"  . History of kidney stones 01-08-13   past hx.  . History of stomach ulcers    "some have bleed"  . Hyperlipemia   . Hypertension 12/07/2012  . Insomnia, unspecified   . Myocardial infarction [facility]) 01-08-13   '06-Chest pain-no stent-dx. MI-stress related  . Osteoporosis   . Personal history of colonic polyps 09/2010   TUBULAR ADENOMAS (X3); NEGATIVE FOR HIGH GRADE DYSPLASIA OR MALIGNANCY.  [name] Pneumonia    "several times" (08/03/2016)  . Stroke [facility])    "2 or 3"; remains with some right sided weaknes (08/03/2016)  . Type II diabetes mellitus ([place])   . Urinary frequency    AND INCONTINENCE   Past Surgical History:  Procedure Laterality Date  . ATRIAL FIBRILLATION ABLATION  08/03/2016  . CARDIAC CATHETERIZATION     normal per Dr Acie Fredrickson in note dated 02/06/12   . CARDIAC CATHETERIZATION     "I've had 2 or 3" (08/03/2016)  . CARPAL TUNNEL RELEASE Right   . CATARACT EXTRACTION W/ INTRAOCULAR LENS  IMPLANT, BILATERAL    . COLONOSCOPY W/ BIOPSIES AND POLYPECTOMY    . DILATION AND CURETTAGE OF UTERUS  S/P miscarriage  . ELECTROPHYSIOLOGIC STUDY N/A 08/03/2016   Procedure: Atrial Fibrillation Ablation;  Surgeon: Will Meredith Leeds, MD;  Location: [facility];  Service: Cardiovascular;  Laterality: N/A;  . ESOPHAGEAL DILATION  01-08-13   2 yrs ago  . HEEL SPUR SURGERY Left 2005  . KNEE ARTHROSCOPY  02/28/2012   Procedure: ARTHROSCOPY KNEE;  Surgeon: Tobi Bastos, MD;  Location: [facility];  Service: Orthopedics;  Laterality: Left;  . LAPAROSCOPIC CHOLECYSTECTOMY  03/2002  . SHOULDER OPEN ROTATOR CUFF REPAIR Right 2011  . TONSILLECTOMY AND ADENOIDECTOMY  1965  . TOTAL KNEE ARTHROPLASTY Left 01/17/2013   Procedure: LEFT TOTAL KNEE ARTHROPLASTY;  Surgeon:  Tobi Bastos, MD;  Location: [facility];  Service: Orthopedics;  Laterality: Left;  . TOTAL KNEE ARTHROPLASTY Right 06/19/2014   Procedure: RIGHT TOTAL KNEE ARTHROPLASTY;  Surgeon: Tobi Bastos, MD;  Location: [facility];  Service: Orthopedics;  Laterality: Right;  . TUBAL LIGATION    . VAGINAL HYSTERECTOMY     "partial"  . WRIST FLEXION TENDON TENOTOMIES AND PROXIMAL CORPECTOMY W/ WRIST ARTHRODESIS&ILIAC CREST BONE GRAFT       Current Outpatient Prescriptions  Medication Sig Dispense Refill  . acetaminophen (TYLENOL) 325 MG tablet Take 1-2 tablets (325-650 mg total) by mouth every 4 (four) hours as needed for mild pain.    [name] albuterol (PROVENTIL HFA;VENTOLIN HFA) 108 (90 Base) MCG/ACT inhaler Inhale 2 puffs into the lungs every 4 (four) hours as needed for wheezing or shortness of breath. 3 Inhaler 3  . albuterol (PROVENTIL) (2.5 MG/3ML) 0.083% nebulizer solution Take 3 mLs (2.5 mg total) by nebulization every 6 (six) hours as needed for wheezing. DX  491.9 360 mL 12  . ALPRAZolam (XANAX) 0.5 MG tablet Take 0.5 mg by mouth 3 (three) times daily as needed for anxiety or sleep.     [name] atorvastatin (LIPITOR) 40 MG tablet Take 1 tablet (40 mg total) by mouth at bedtime. 30 tablet 0  . azelastine (ASTELIN) 0.1 % nasal spray Place 1 spray into both nostrils 2 (two) times daily. Use in each nostril as directed    . benzonatate (TESSALON) 200 MG capsule TAKE 1 CAPSULE 3 TIMES DAILY AS NEEDED FOR COUGH 15 capsule 2  . bismuth subsalicylate (PEPTO BISMOL) 262 MG/15ML suspension Take 30 mLs by mouth every 6 (six) hours as needed for diarrhea or loose stools.    . budesonide-formoterol (SYMBICORT) 160-4.5 MCG/ACT inhaler Inhale 2 puffs into the lungs 2 (two) times daily. 1 Inhaler 12  . calcium carbonate (OS-CAL) 600 MG TABS Take 600 mg by mouth at bedtime.     . cholecalciferol (VITAMIN D) 1000 UNITS tablet Take 1,000 Units by mouth at bedtime.     [name] doxycycline (VIBRA-TABS) 100 MG tablet Take 2 tablets  today,then 1 daily until gone 8 tablet 0  . ELIQUIS 5 MG TABS tablet TAKE ONE TABLET BY MOUTH TWICE DAILY 60 tablet 6  . EPIPEN 2-PAK 0.3 MG/0.3ML SOAJ injection USE AS DIRECTED 1 Device 6  . ferrous sulfate 325 (65 FE) MG tablet Take 1 tablet (325 mg total) by mouth 2 (two) times daily. 60 tablet 1  . fluticasone (FLONASE) 50 MCG/ACT nasal spray Place 2 sprays into the nose daily. (Patient taking differently: Place 2 sprays into the nose 2 (two) times daily. ) 16 g prn  . furosemide (LASIX) 20 MG tablet Take 1 tablet (20 mg total) by mouth daily. 90 tablet 1  . Glycopyrrolate-Formoterol (BEVESPI AEROSPHERE) 9-4.8 MCG/ACT AERO  Inhale 2 puffs into the lungs 2 (two) times daily. 10.7 g 12  . Glycopyrrolate-Formoterol (BEVESPI AEROSPHERE) 9-4.8 MCG/ACT AERO Inhale 2 puffs into the lungs 2 (two) times daily. 1 Inhaler 0  . levalbuterol (XOPENEX HFA) 45 MCG/ACT inhaler Inhale 2 puffs every 6 hours- rescue 1 Inhaler 12  . losartan (COZAAR) 50 MG tablet Take 1 tablet (50 mg total) by mouth daily. (Patient taking differently: Take 50 mg by mouth at bedtime. ) 90 tablet 3  . montelukast (SINGULAIR) 10 MG tablet Take 1 tablet (10 mg total) by mouth at bedtime. (Patient taking differently: Take 10 mg by mouth every morning. ) 30 tablet 11  . PARoxetine (PAXIL) 20 MG tablet Take 20 mg by mouth at bedtime.     [name] tiotropium (SPIRIVA) 18 MCG inhalation capsule Place 1 capsule (18 mcg total) into inhaler and inhale daily. 30 capsule 6  . vitamin B-12 (CYANOCOBALAMIN) 250 MCG tablet Take 250 mcg by mouth at bedtime.     . isosorbide mononitrate (IMDUR) 30 MG 24 hr tablet Take 1 tablet (30 mg total) by mouth daily. 90 tablet 3   No current facility-administered medications for this visit.    Facility-Administered Medications Ordered in Other Visits  Medication Dose Route Frequency Provider Last Rate Last Dose  . tranexamic acid (CYKLOKAPRON) 2,000 mg in sodium chloride 0.9 % 50 mL Topical Application  4,742 mg  Topical Once Cecilio Asper, [employer], PA-C        Allergies:   Bee venom; Ceclor [cefaclor]; Penicillins; Pneumococcal vaccines; Codeine; Cortisone; and Boniva [ibandronate sodium]   Social History:  The patient  reports that she quit smoking about 14 months ago. Her smoking use included Cigarettes. She has a 25.00 pack-year smoking history. She quit smokeless tobacco use about 5 years ago. Her smokeless tobacco use included Snuff and Chew. She reports that she drinks alcohol. She reports that she does not use drugs.   Family History:  The patient's family history includes Breast cancer in her maternal aunt, maternal grandmother, and mother; Colon cancer (age of onset: 59) in her brother; Colon polyps in her brother; Diabetes in her maternal grandfather; Heart attack in her father; Heart disease in her unknown relative; Heart failure in her brother and father; Kidney disease in her unknown relative; Stroke in her mother; Transient ischemic attack in her mother.    ROS:  Please see the history of present illness.   Otherwise, review of systems is positive for syncope.   All other systems are reviewed and negative.   PHYSICAL EXAM: VS:  BP 108/64   Pulse (!) 57   Ht 5' 0.5" (1.537 m)   Wt 182 lb 9.6 oz (82.8 kg)   BMI 35.07 kg/m  , BMI Body mass index is 35.07 kg/m. GEN: Well nourished, well developed, in no acute distress  HEENT: normal  Neck: no JVD, carotid bruits, or masses Cardiac: RRR; no murmurs, rubs, or gallops,no edema  Respiratory:  clear to auscultation bilaterally, normal work of breathing GI: soft, nontender, nondistended, + BS MS: no deformity or atrophy  Skin: warm and dry Neuro:  Strength and sensation are intact Psych: euthymic mood, full affect  EKG:  EKG is ordered today. Personal review of the ekg ordered shows sinus rhythm, rate 57, iLBBB   Recent Labs: 04/27/2016: ALT 27 07/28/2016: BUN 9; Creatinine, Ser 0.70; Sodium 141 07/29/2016: B Natriuretic Peptide 24.1;  Brain Natriuretic Peptide CANCELED; Hemoglobin 12.2; Platelets 191 08/03/2016: Potassium 3.2    Lipid Panel  Component Value Date/Time   CHOL 127 02/25/2016 0313   CHOL 129 12/07/2012 1445   TRIG 173 (H) 02/25/2016 0313   TRIG 147 12/07/2012 1445   HDL 25 (L) 02/25/2016 0313   HDL 40 12/07/2012 1445   CHOLHDL 5.1 02/25/2016 0313   VLDL 35 02/25/2016 0313   LDLCALC 67 02/25/2016 0313   LDLCALC 60 12/07/2012 1445     Wt Readings from Last 3 Encounters:  03/09/17 182 lb 9.6 oz (82.8 kg)  01/24/17 184 lb 3.2 oz (83.6 kg)  12/16/16 183 lb (83 kg)      Other studies Reviewed: Additional studies/ records that were reviewed today include: TTE 03/02/17 Review of the above records today demonstrates:  - Left ventricle: Distal septal hypokinesis. The cavity size was   normal. Systolic function was normal. The estimated ejection   fraction was 55%. Wall motion was normal; there were no regional   wall motion abnormalities. Left ventricular diastolic function   parameters were normal. - Atrial septum: No defect or patent foramen ovale was identified. - Pulmonary arteries: PA peak pressure: 34 mm Hg (S).  02/23/16 SPECT  The left ventricular ejection fraction is normal (55-65%).  Nuclear stress EF: 60%.  There was no ST segment deviation noted during stress.  No T wave inversion was noted during stress.  Defect 1: There is a small defect of moderate severity present in the apical anterior and apex location. Consistent with breast attenuation and unchanged from 2015.  The study is normal.  This is a low risk study.   ASSESSMENT AND PLAN:  1.  Paroxysmal atrial fibrillation: AF ablation 08/03/16. Anticoagulated with Eliquis. Remains in sinus rhythm.  This patients CHA2DS2-VASc Score and unadjusted Ischemic Stroke Rate (% per year) is equal to 3.2 % stroke rate/year from a score of 3  Above score calculated as 1 point each if present [CHF, HTN, DM, Vascular=MI/PAD/Aortic  Plaque, Age if 65-74, or Female] Above score calculated as 2 points each if present [Age > 75, or Stroke/TIA/TE]  2. Hypertension:  well-controlled today.  3. takotsubo cardiomyopathy:  Was fatigued with carvedilol. Remains on Lasix and ARB. No changes at this time.  4. Syncope: Unclear cause of her syncope. She has had significant injuries with quite a bit of bruising and facial pain. She is in sinus rhythm today and her most recent echo showed a normal ejection fraction. We'll plan for a 30 day monitor.   Current medicines are reviewed at length with the patient today.   The patient does not have concerns regarding her medicines.  The following changes were made today:  none  Labs/ tests ordered today include:  Orders Placed This Encounter  Procedures  . Cardiac event monitor  . EKG 12-Lead     Disposition:   FU with Will Camnitz 4 months  Signed, Will Meredith Leeds, MD  03/09/2017 9:55 AM     [facility] [address] [place] [place] [address] [phone] (office) [phone] (fax)

## 2017-03-09 NOTE — Patient Instructions (Signed)
Prevnar 13 pneumonia vaccine  Script printed to try an albuterol HFA rescue inhaler    2 puffs up to every 6 hours if needed for wheezing and shortness of breath      This will be cheaper than the levalbuterol inhaler you are using now.       Ok to continue Spiriva once daily  Please call if we can help

## 2017-03-09 NOTE — Patient Instructions (Signed)
Medication Instructions:   NO CHANGE  Testing/Procedures:  Your physician has recommended that you wear an event monitor. Event monitors are medical devices that record the heart's electrical activity. Doctors most often [place] these monitors to diagnose arrhythmias. Arrhythmias are problems with the speed or rhythm of the heartbeat. The monitor is a small, portable device. You can wear one while you do your normal daily activities. This is usually used to diagnose what is causing palpitations/syncope (passing out).    Follow-Up:  Your physician recommends that you schedule a follow-up appointment in: AS SCHEDULED   If you need a refill on your cardiac medications before your next appointment, please call your pharmacy.

## 2017-03-09 NOTE — Progress Notes (Signed)
HPI female former smoker followed for chronic bronchitis, allergic rhinitis, complicated by DM 2, history CVA, A. Fib/ CM/CHF PFT 11/05/12 NPSG 05/17/16- WNL AHI 2.2/ hr Echocardiogram 08/10/2016 EF 90-24%, Gr 2 diastolic dysfunction Office Spirometry 11/07/2016-moderate obstructive airways disease. FVC 1.95/70%, FEV1 1.43/67%, ratio 0.73, FEF 25-75% 0.99/49% ------------------------------------------------------------------------------------------------------------  11/07/2016- 66 year old female former smoker followed for chronic bronchitis, allergic rhinitis, complicated by DM 2, history CVA, A. Fib/CM/CHF FOLLOWS FOR: Pt states she is unsure if increased SOB is coming from her heart or lungs-past 4 months.  Here with husband who also cough she denies recent cold but admits to some pollen drainage. Quit smoking one year ago. She has been using Zyrtec and Flonase were to work adequately for control and postnasal drip. We discussed her cardiology status with atrial fib/CHF. She can feel the palpitation and increased shortness of breath when she goes into atrial fibrillation. Doesn't like albuterol rescue inhaler which makes her feel nervous and may increase her heart rate. Continues Symbicort Office Spirometry 11/07/2016-moderate obstructive airways disease. FVC 1.95/70%, FEV1 1.43/67%, ratio 0.73, FEF 25-75% 0.99/49% She was wheezy after the spirometry effort and needed her nebulizer treatment with Xopenex.  03/09/17- 66 year old female former smoker followed for COPD, allergic rhinitis, complicated by DM 2, history CVA,  A. Fib/CM/CHF Echocardiogram 03/02/17-EF 55%, PA peak 34 mm FOLLOWS FOR:Pt states she has noticed she is more short winded given heat and humdity;clears up when heat settles down.  Tremor being evaluated by neurology. Insurance won't cover Xopenex and we aren't sure how much difference albuterol will make. Has had heart rhythm problems blamed for falls and being managed by cardiology on  Eliquis. Likes Spiriva History intense local allergic reaction to pneumonia vaccine.  ROS-see HPI + = posConstitutional:   No-   weight loss, night sweats, fevers, chills, fatigue, lassitude. HEENT:   +  headaches, difficulty swallowing, tooth/dental problems, sore throat,       No-  Sneezing, itching, no-ear ache, +nasal congestion, +post nasal drip,  CV:  No-   chest pain, orthopnea, PND, swelling in lower extremities, anasarca, dizziness, +palpitations Resp: +  shortness of breath with exertion or at rest.              No-   productive cough,  + non-productive cough,  No- coughing up of blood.              No-   change in color of mucus.   wheezing.   Skin: No- rash or lesions. GI:  No- heartburn, indigestion, no-abdominal pain, nausea, vomiting,  GU: . MS:  + joint pain or swelling.   Neuro-     nothing unusual Psych:  No- change in mood or affect. + depression or anxiety.  No memory loss.  OBJ- Physical Exam General- Alert, Oriented, Affect-appropriate, Distress- none acute. + Overweight Skin- + bruises on face from falling Lymphadenopathy- none Head- atraumatic            Eyes- Gross vision intact, PERRLA, conjunctivae and secretions clear            Ears- Hearing, canals-normal            Nose- Clear, no-Septal dev, mucus, polyps, erosion, perforation             Throat- Mallampati III-IV , mucosa clear , drainage- none, tonsils- atrophic, + dentures Neck- flexible , trachea midline, no stridor , thyroid nl, carotid no bruit Chest - symmetrical excursion , unlabored  Heart/CV- RRR c/w RSR today , no murmur , no gallop  , no rub, nl s1 s2                           - JVD- none , edema- none, stasis changes- none, varices- none           Lung-  Cough-none , dullness-none, rub- none, wheeze-none           Chest wall-  Abd-  Br/ Gen/ Rectal- Not done, not indicated Extrem- cyanosis- none, clubbing, none, atrophy- none, strength- nl Neuro- grossly intact to  observation

## 2017-03-12 NOTE — Assessment & Plan Note (Signed)
I'm not sure if her heart rhythm and tremor problems will tolerate albuterol, but we are going to try switching since insurance doesn't cover Xopenex. If she doesn't tolerate albuterol we will have to seek prior authorization. Meanwhile she continues Spiriva.

## 2017-03-20 ENCOUNTER — Ambulatory Visit (INDEPENDENT_AMBULATORY_CARE_PROVIDER_SITE_OTHER): Payer: Medicaid Other | Admitting: Neurology

## 2017-03-20 ENCOUNTER — Encounter: Payer: Self-pay | Admitting: Neurology

## 2017-03-20 VITALS — BP 129/70 | HR 57 | Ht 60.5 in | Wt 182.5 lb

## 2017-03-20 DIAGNOSIS — G250 Essential tremor: Secondary | ICD-10-CM | POA: Diagnosis not present

## 2017-03-20 DIAGNOSIS — R2681 Unsteadiness on feet: Secondary | ICD-10-CM | POA: Diagnosis not present

## 2017-03-20 DIAGNOSIS — R55 Syncope and collapse: Secondary | ICD-10-CM

## 2017-03-20 HISTORY — DX: Essential tremor: G25.0

## 2017-03-20 NOTE — Progress Notes (Signed)
Reason for visit: Tremor   Referring physician: Dr. Payton Mccallum [name] [name] is a 66 y.o. female  History of present illness:  [name] [name] is a 66 year old right-handed white female with a history of a tremor affecting both upper extremities that began about 6 months ago. The patient has an underlying anxiety disorder, she has alprazolam to take for this, she is taking at least 3 of his 0.5 mg tablets daily. The patient indicates that the tremor affects her ability to do things with her hands, and affects her handwriting. She does not note any tremor with resting the arms. She indicates that her mother has a similar tremor. She denies any numbness on the extremities, she feels "weak all over". The patient does have some gait instability, she had a syncopal event 3 weeks ago where she suddenly lost consciousness and fell striking the left brow, she has had a headache since the fall. She is getting a cardiac workup for this event. She has a history of atrial fibrillation. She is on anticoagulation, she did not undergo MRI or CT scan of the brain following the fall. She is sent to this office mainly because of the tremor.  Past Medical History:  Diagnosis Date  . Anemia    hx of years ago   . Anxiety   . Aortic valve disorders   . Arthritis   . Asthma   . Barrett esophagus   . Bipolar disorder ([place])   . CHF (congestive heart failure) ([place])   . Chronic bronchitis ([place])   . Chronic kidney disease    "I don't have but 1" (08/03/2016)  . COPD (chronic obstructive pulmonary disease) ([place])    "just have a touch" (08/03/2016)  . Depression   . Esophageal reflux   . Esophageal stricture   . Fibromyalgia    "hands, knees, elbows; back; think it's in my hips" (08/03/2016)  . GAD (generalized anxiety disorder)   . GERD (gastroesophageal reflux disease)   . Hiatal hernia   . History of blood transfusion 1970s; 01/08/2013   "low blood count"  . History of kidney stones 01-08-13   past hx.  .  History of stomach ulcers    "some have bleed"  . Hyperlipemia   . Hypertension 12/07/2012  . Insomnia, unspecified   . Myocardial infarction [facility]) 01-08-13   '06-Chest pain-no stent-dx. MI-stress related  . Osteoporosis   . Personal history of colonic polyps 09/2010   TUBULAR ADENOMAS (X3); NEGATIVE FOR HIGH GRADE DYSPLASIA OR MALIGNANCY.  [name] Pneumonia    "several times" (08/03/2016)  . Stroke [facility])    "2 or 3"; remains with some right sided weaknes (08/03/2016)  . Type II diabetes mellitus ([place])   . Urinary frequency    AND INCONTINENCE    Past Surgical History:  Procedure Laterality Date  . ATRIAL FIBRILLATION ABLATION  08/03/2016  . CARDIAC CATHETERIZATION     normal per Dr Acie Fredrickson in note dated 02/06/12   . CARDIAC CATHETERIZATION     "I've had 2 or 3" (08/03/2016)  . CARPAL TUNNEL RELEASE Right   . CATARACT EXTRACTION W/ INTRAOCULAR LENS  IMPLANT, BILATERAL    . COLONOSCOPY W/ BIOPSIES AND POLYPECTOMY    . DILATION AND CURETTAGE OF UTERUS     S/P miscarriage  . ELECTROPHYSIOLOGIC STUDY N/A 08/03/2016   Procedure: Atrial Fibrillation Ablation;  Surgeon: Will Meredith Leeds, MD;  Location: [facility] [facility];  Service: Cardiovascular;  Laterality: N/A;  . ESOPHAGEAL DILATION  01-08-13   2 yrs ago  . HEEL SPUR SURGERY Left 2005  . KNEE ARTHROSCOPY  02/28/2012   Procedure: ARTHROSCOPY KNEE;  Surgeon: Tobi Bastos, MD;  Location: [facility];  Service: Orthopedics;  Laterality: Left;  . LAPAROSCOPIC CHOLECYSTECTOMY  03/2002  . SHOULDER OPEN ROTATOR CUFF REPAIR Right 2011  . TONSILLECTOMY AND ADENOIDECTOMY  1965  . TOTAL KNEE ARTHROPLASTY Left 01/17/2013   Procedure: LEFT TOTAL KNEE ARTHROPLASTY;  Surgeon: Tobi Bastos, MD;  Location: [facility];  Service: Orthopedics;  Laterality: Left;  . TOTAL KNEE ARTHROPLASTY Right 06/19/2014   Procedure: RIGHT TOTAL KNEE ARTHROPLASTY;  Surgeon: Tobi Bastos, MD;  Location: [facility];  Service: Orthopedics;  Laterality: Right;  . TUBAL LIGATION    .  VAGINAL HYSTERECTOMY     "partial"  . WRIST FLEXION TENDON TENOTOMIES AND PROXIMAL CORPECTOMY W/ WRIST ARTHRODESIS&ILIAC CREST BONE GRAFT      Family History  Problem Relation Age of Onset  . Breast cancer Mother   . Stroke Mother   . Transient ischemic attack Mother   . Colon cancer Brother 64  . Colon polyps Brother   . Heart failure Brother   . Heart attack Father   . Heart failure Father   . Diabetes Maternal Grandfather   . Kidney disease Unknown        Both sides of family  . Heart disease Unknown        Both sides of family  . Breast cancer Maternal Aunt   . Breast cancer Maternal Grandmother     Social history:  reports that she quit smoking about 14 months ago. Her smoking use included Cigarettes. She has a 25.00 pack-year smoking history. She quit smokeless tobacco use about 5 years ago. Her smokeless tobacco use included Snuff and Chew. She reports that she drinks alcohol. She reports that she does not use drugs.  Medications:  Prior to Admission medications   Medication Sig Start Date End Date Taking? Authorizing Provider  acetaminophen (TYLENOL) 325 MG tablet Take 1-2 tablets (325-650 mg total) by mouth every 4 (four) hours as needed for mild pain. 06/27/14  Yes Love, Ivan Anchors, PA-C  albuterol (PROVENTIL HFA;VENTOLIN HFA) 108 (90 Base) MCG/ACT inhaler Inhale 2 puffs into the lungs every 4 (four) hours as needed for wheezing or shortness of breath. 05/09/16 06/30/17 Yes Young, Clinton D, MD  albuterol (PROVENTIL HFA;VENTOLIN HFA) 108 (90 Base) MCG/ACT inhaler Inhale 2 puffs into the lungs every 6 (six) hours as needed for wheezing or shortness of breath. 03/09/17  Yes Young, Tarri Fuller D, MD  albuterol (PROVENTIL) (2.5 MG/3ML) 0.083% nebulizer solution Take 3 mLs (2.5 mg total) by nebulization every 6 (six) hours as needed for wheezing. DX  491.9 01/08/15  Yes Young, Tarri Fuller D, MD  ALPRAZolam Duanne Moron) 0.5 MG tablet Take 0.5 mg by mouth 3 (three) times daily as needed for anxiety  or sleep.    Yes [provider]  atorvastatin (LIPITOR) 40 MG tablet Take 1 tablet (40 mg total) by mouth at bedtime. 07/09/13  Yes Martin, Mary-Margaret, FNP  azelastine (ASTELIN) 0.1 % nasal spray Place 1 spray into both nostrils 2 (two) times daily. Use in each nostril as directed   Yes [provider]  benzonatate (TESSALON) 200 MG capsule TAKE 1 CAPSULE 3 TIMES DAILY AS NEEDED FOR COUGH 12/31/15  Yes Young, Clinton D, MD  bismuth subsalicylate (PEPTO BISMOL) 262 MG/15ML suspension Take 30 mLs by mouth every 6 (six) hours as needed for diarrhea  or loose stools.   Yes [provider]  calcium carbonate (OS-CAL) 600 MG TABS Take 600 mg by mouth at bedtime.    Yes [provider]  cholecalciferol (VITAMIN D) 1000 UNITS tablet Take 1,000 Units by mouth at bedtime.    Yes [provider]  ELIQUIS 5 MG TABS tablet TAKE ONE TABLET BY MOUTH TWICE DAILY 02/20/17  Yes Camnitz, Will Hassell Done, MD  EPIPEN 2-PAK 0.3 MG/0.3ML SOAJ injection USE AS DIRECTED 03/15/16  Yes Young, Clinton D, MD  ferrous sulfate 325 (65 FE) MG tablet Take 1 tablet (325 mg total) by mouth 2 (two) times daily. 06/27/14  Yes Love, Ivan Anchors, PA-C  fluticasone (FLONASE) 50 MCG/ACT nasal spray Place 2 sprays into the nose daily. Patient taking differently: Place 2 sprays into the nose 2 (two) times daily.  05/07/13  Yes Young, Tarri Fuller D, MD  furosemide (LASIX) 20 MG tablet Take 1 tablet (20 mg total) by mouth daily. 01/24/17 04/24/17 Yes Camnitz, Will Hassell Done, MD  levalbuterol [facility] HFA) 45 MCG/ACT inhaler Inhale 2 puffs every 6 hours- rescue 11/07/16  Yes Young, Tarri Fuller D, MD  losartan (COZAAR) 50 MG tablet Take 1 tablet (50 mg total) by mouth daily. Patient taking differently: Take 50 mg by mouth at bedtime.  07/03/14  Yes Nahser, Wonda Cheng, MD  montelukast (SINGULAIR) 10 MG tablet Take 1 tablet (10 mg total) by mouth at bedtime. Patient taking differently: Take 10 mg by mouth every morning.  05/15/14   Yes Young, Tarri Fuller D, MD  PARoxetine (PAXIL) 20 MG tablet Take 20 mg by mouth at bedtime.    Yes [provider]  tiotropium (SPIRIVA) 18 MCG inhalation capsule Place 1 capsule (18 mcg total) into inhaler and inhale daily. 11/15/16  Yes Young, Tarri Fuller D, MD  vitamin B-12 (CYANOCOBALAMIN) 250 MCG tablet Take 250 mcg by mouth at bedtime.    Yes [provider]  isosorbide mononitrate (IMDUR) 30 MG 24 hr tablet Take 1 tablet (30 mg total) by mouth daily. 10/27/16 03/09/17  Constance Haw, MD      Allergies  Allergen Reactions  . Bee Venom Anaphylaxis  . Ceclor [Cefaclor] Other (See Comments)    Reaction=burning all over  . Penicillins Anaphylaxis    "CLOSES OFF MY BREATHING" Has patient had a PCN reaction causing immediate rash, facial/tongue/throat swelling, SOB or lightheadedness with hypotension: Yes Has patient had a PCN reaction causing severe rash involving mucus membranes or skin necrosis: No Has patient had a PCN reaction that required hospitalization No Has patient had a PCN reaction occurring within the last 10 years: No If all of the above answers are "NO", then may proceed with Cephalosporin use.   . Pneumococcal Vaccines Other (See Comments)    PP-23 vaccine; had BIG local red reaction with heat.   . Codeine Nausea And Vomiting  . Cortisone Hives    All over body  . Boniva [Ibandronate Sodium] Other (See Comments)    Jaw popping    ROS:  Out of a complete 14 system review of symptoms, the patient complains only of the following symptoms, and all other reviewed systems are negative.  Heart murmur, swelling in the legs Shortness of breath Easy bruising  Blood pressure 129/70, pulse (!) 57, height 5' 0.5" (1.537 m), weight 182 lb 8 oz (82.8 kg).  Physical Exam  General: The patient is alert and cooperative at the time of the examination. The patient is moderately obese.  Eyes: Pupils are equal, round, and reactive  to light. Discs are flat  bilaterally.  Neck: The neck is supple, no carotid bruits are noted.  Respiratory: The respiratory examination is clear.  Cardiovascular: The cardiovascular examination reveals a regular rate and rhythm, no obvious murmurs or rubs are noted.  Skin: Extremities are with 1+ edema of ankles bilaterally.  Neurologic Exam  Mental status: The patient is alert and oriented x 3 at the time of the examination. The patient has apparent normal recent and remote memory, with an apparently normal attention span and concentration ability.  Cranial nerves: Facial symmetry is present. There is good sensation of the face to pinprick and soft touch bilaterally. The strength of the facial muscles and the muscles to head turning and shoulder shrug are normal bilaterally. Speech is well enunciated, no aphasia or dysarthria is noted. Extraocular movements are full. Visual fields are full. The tongue is midline, and the patient has symmetric elevation of the soft palate. No obvious hearing deficits are noted.  Motor: The motor testing reveals 5 over 5 strength of all 4 extremities. Good symmetric motor tone is noted throughout.  Sensory: Sensory testing is intact to pinprick, soft touch, vibration sensation, and position sense on all 4 extremities. No evidence of extinction is noted.  Coordination: Cerebellar testing reveals good finger-nose-finger and heel-to-shin bilaterally. Very minimal tremor is seen with finger-nose-finger bilaterally, no tremor seems to be translated into handwriting and drawing a spiral.  Gait and station: Gait is wide-based, the patient can walk independently. Tandem gait is unsteady. Romberg is negative. No drift is seen.  Reflexes: Deep tendon reflexes are symmetric and normal bilaterally. Toes are downgoing bilaterally.   MRI brain 02/24/16:  IMPRESSION: Normal MRI of the brain. Normal intracranial MR angiography. Normal MR angiography of the neck vessels with the exception of  some tortuosity of the internal carotid arteries which can be seen with a history of hypertension. Note that the vertebral artery origins are not well seen because of breathing motion in the chest, but I have no reason to suspect that they are diseased.  * MRI scan images were reviewed online. I agree with the written report.    Assessment/Plan:  1. Essential tremor  2. Recent syncopal event  3. Anxiety disorder  The patient has a mild essential tremor, she may use alprazolam if needed for this issue with the tremor is severe. The patient did have a recent syncopal event, for this reason she will undergo MRI of the brain. She will follow-up in about 5 or 6 months. If the tremor worsens over time, we will add another medication for the tremor. She will contact our office for any issues.  Jill Alexanders MD 03/20/2017 3:13 PM  [facility] [address] [place], [place] [address]  Phone [phone] Fax [phone]

## 2017-03-20 NOTE — Patient Instructions (Signed)
   We will check a MRI of the head.

## 2017-03-22 ENCOUNTER — Ambulatory Visit (INDEPENDENT_AMBULATORY_CARE_PROVIDER_SITE_OTHER): Payer: Medicaid Other

## 2017-03-22 DIAGNOSIS — R55 Syncope and collapse: Secondary | ICD-10-CM

## 2017-04-05 ENCOUNTER — Ambulatory Visit
Admission: RE | Admit: 2017-04-05 | Discharge: 2017-04-05 | Disposition: A | Discharge status: Home / Self Care | Payer: Medicaid Other | Source: Ambulatory Visit | Attending: Neurology | Admitting: Neurology

## 2017-04-05 DIAGNOSIS — G250 Essential tremor: Secondary | ICD-10-CM

## 2017-04-05 DIAGNOSIS — R55 Syncope and collapse: Secondary | ICD-10-CM

## 2017-04-06 ENCOUNTER — Telehealth: Payer: Self-pay | Admitting: Neurology

## 2017-04-06 NOTE — Telephone Encounter (Signed)
I called the patient. The MRI the brain was normal. I discussed this with her.   MRI brain 04/05/17:  IMPRESSION:  Unremarkable MRI brain (without). No acute findings.

## 2017-06-01 ENCOUNTER — Other Ambulatory Visit: Payer: Self-pay | Admitting: Cardiology

## 2017-06-21 ENCOUNTER — Other Ambulatory Visit: Payer: Self-pay | Admitting: Internal Medicine

## 2017-07-01 ENCOUNTER — Other Ambulatory Visit: Payer: Self-pay | Admitting: Cardiology

## 2017-07-03 ENCOUNTER — Telehealth: Payer: Self-pay

## 2017-07-03 NOTE — Telephone Encounter (Signed)
Refill  request for flecainide was sent and Roderic Palau took pt off medication back in February. Pt haven't stopped as directed.

## 2017-07-04 NOTE — Telephone Encounter (Signed)
Called pt to confirm if she was still taking Flecainide - pt reports that she didn't know she was supposed to stop it.  Informed her that OV in February, from [facility] clinic, she was advised to stop then (post ablation).   Pt has been continuing to take Flecainide unaware she was supposed to stop.  Informed pt that I would have Dr. Curt Bears review her chart today/tomorrow and call her once reviewed and advised on.  (She confirms she has enough medication on hand)  She understands I will call her in the next few days and to continue taking the medication until further advised on.  Patient verbalized understanding and agreeable to plan.

## 2017-07-05 NOTE — Telephone Encounter (Signed)
Advised patient to stop Flecainide, per Dr. Curt Bears. Patient verbalized understanding and agreeable to plan.   Will forward back to refill pool to deny refills for Flecainide

## 2017-07-05 NOTE — Telephone Encounter (Signed)
error 

## 2017-07-14 ENCOUNTER — Telehealth: Payer: Self-pay

## 2017-07-14 NOTE — Telephone Encounter (Signed)
Jinny Blossom will be out of the office 08/22/17 PM. Patient can be rescheduled with Marilu Favre, or Jannifer Franklin.

## 2017-07-22 ENCOUNTER — Other Ambulatory Visit: Payer: Self-pay | Admitting: Cardiology

## 2017-07-28 NOTE — Telephone Encounter (Signed)
Looked at schedules for Marilu Favre, and Jannifer Franklin. All surrounding weeks have no open spots.

## 2017-08-02 NOTE — Telephone Encounter (Signed)
[name] [name]- you can offer 730am 08/22/17 with Dr Jannifer Franklin

## 2017-08-08 ENCOUNTER — Ambulatory Visit: Payer: Medicaid Other | Admitting: Cardiology

## 2017-08-08 ENCOUNTER — Telehealth: Payer: Self-pay | Admitting: *Deleted

## 2017-08-08 ENCOUNTER — Encounter: Payer: Self-pay | Admitting: Cardiology

## 2017-08-08 VITALS — BP 102/62 | HR 55 | Ht 60.5 in | Wt 178.0 lb

## 2017-08-08 DIAGNOSIS — I480 Paroxysmal atrial fibrillation: Secondary | ICD-10-CM | POA: Diagnosis not present

## 2017-08-08 DIAGNOSIS — I10 Essential (primary) hypertension: Secondary | ICD-10-CM | POA: Diagnosis not present

## 2017-08-08 NOTE — Patient Instructions (Signed)
Medication Instructions:  Your physician recommends that you continue on your current medications as directed. Please refer to the Current Medication list given to you today.  * If you need a refill on your cardiac medications before your next appointment, please call your pharmacy. *  Labwork: None ordered  Testing/Procedures: None ordered  Follow-Up: Your physician wants you to follow-up in: 6 months with Dr. Curt Bears.  You will receive a reminder letter in the mail two months in advance. If you don't receive a letter, please call our office to schedule the follow-up appointment.  Thank you for choosing [facility]!!

## 2017-08-08 NOTE — Progress Notes (Signed)
Electrophysiology Office Note   Date:  08/08/2017   ID:  [name] [name] 06-04-1951, MRN [id number]  PCP:  Velna Hatchet, MD  Cardiologist:  [facility] Primary Electrophysiologist:  Will Meredith Leeds, MD    Chief Complaint  Patient presents with  . Follow-up    PAF     History of Present Illness: [name] [name] is a 67 y.o. female who presents today for electrophysiology evaluation.   Hx paroxysmal atrial fibrillaiton, Takotsubo, HLD. Her EF has normalized from a low of 15-20%. She was admitted to the hospital in July with chest pain, and was found to have atrial fibrillation with RVR. She had a stress test which was a low risk study. An echo at the time showed that her EF was normal. Since being seen, she has been maintained on flecainide. She has been compliant with her anticoagulation. AF ablation 08/03/16.  Today, denies symptoms of palpitations, chest pain, shortness of breath, orthopnea, PND, lower extremity edema, claudication, dizziness, presyncope, syncope, bleeding, or neurologic sequela. The patient is tolerating medications without difficulties.  She has been feeling well without major complaints.  She is able to do most of her daily activities.  She does get some shortness of breath when climbing up and down stairs but otherwise has been doing well.    Past Medical History:  Diagnosis Date  . Anemia    hx of years ago   . Anxiety   . Aortic valve disorders   . Arthritis   . Asthma   . Barrett esophagus   . Bipolar disorder ([place])   . CHF (congestive heart failure) ([place])   . Chronic bronchitis ([place])   . Chronic kidney disease    "I don't have but 1" (08/03/2016)  . COPD (chronic obstructive pulmonary disease) ([place])    "just have a touch" (08/03/2016)  . Depression   . Esophageal reflux   . Esophageal stricture   . Fibromyalgia    "hands, knees, elbows; back; think it's in my hips" (08/03/2016)  . GAD (generalized anxiety disorder)   . GERD (gastroesophageal  reflux disease)   . Hiatal hernia   . History of blood transfusion 1970s; 01/08/2013   "low blood count"  . History of kidney stones 01-08-13   past hx.  . History of stomach ulcers    "some have bleed"  . Hyperlipemia   . Hypertension 12/07/2012  . Insomnia, unspecified   . Myocardial infarction [facility]) 01-08-13   '06-Chest pain-no stent-dx. MI-stress related  . Osteoporosis   . Personal history of colonic polyps 09/2010   TUBULAR ADENOMAS (X3); NEGATIVE FOR HIGH GRADE DYSPLASIA OR MALIGNANCY.  [name] Pneumonia    "several times" (08/03/2016)  . Stroke [facility])    "2 or 3"; remains with some right sided weaknes (08/03/2016)  . Tremor, essential 03/20/2017  . Type II diabetes mellitus ([place])   . Urinary frequency    AND INCONTINENCE   Past Surgical History:  Procedure Laterality Date  . ATRIAL FIBRILLATION ABLATION  08/03/2016  . CARDIAC CATHETERIZATION     normal per Dr Acie Fredrickson in note dated 02/06/12   . CARDIAC CATHETERIZATION     "I've had 2 or 3" (08/03/2016)  . CARPAL TUNNEL RELEASE Right   . CATARACT EXTRACTION W/ INTRAOCULAR LENS  IMPLANT, BILATERAL    . COLONOSCOPY W/ BIOPSIES AND POLYPECTOMY    . DILATION AND CURETTAGE OF UTERUS     S/P miscarriage  . ELECTROPHYSIOLOGIC STUDY N/A 08/03/2016   Procedure: Atrial Fibrillation  Ablation;  Surgeon: Will Meredith Leeds, MD;  Location: [facility];  Service: Cardiovascular;  Laterality: N/A;  . ESOPHAGEAL DILATION  01-08-13   2 yrs ago  . HEEL SPUR SURGERY Left 2005  . KNEE ARTHROSCOPY  02/28/2012   Procedure: ARTHROSCOPY KNEE;  Surgeon: Tobi Bastos, MD;  Location: [facility];  Service: Orthopedics;  Laterality: Left;  . LAPAROSCOPIC CHOLECYSTECTOMY  03/2002  . SHOULDER OPEN ROTATOR CUFF REPAIR Right 2011  . TONSILLECTOMY AND ADENOIDECTOMY  1965  . TOTAL KNEE ARTHROPLASTY Left 01/17/2013   Procedure: LEFT TOTAL KNEE ARTHROPLASTY;  Surgeon: Tobi Bastos, MD;  Location: [facility];  Service: Orthopedics;  Laterality: Left;  . TOTAL KNEE  ARTHROPLASTY Right 06/19/2014   Procedure: RIGHT TOTAL KNEE ARTHROPLASTY;  Surgeon: Tobi Bastos, MD;  Location: [facility];  Service: Orthopedics;  Laterality: Right;  . TUBAL LIGATION    . VAGINAL HYSTERECTOMY     "partial"  . WRIST FLEXION TENDON TENOTOMIES AND PROXIMAL CORPECTOMY W/ WRIST ARTHRODESIS&ILIAC CREST BONE GRAFT       Current Outpatient Medications  Medication Sig Dispense Refill  . acetaminophen (TYLENOL) 325 MG tablet Take 1-2 tablets (325-650 mg total) by mouth every 4 (four) hours as needed for mild pain.    [name] albuterol (PROVENTIL HFA;VENTOLIN HFA) 108 (90 Base) MCG/ACT inhaler Inhale 2 puffs into the lungs every 4 (four) hours as needed for wheezing or shortness of breath. 3 Inhaler 3  . albuterol (PROVENTIL HFA;VENTOLIN HFA) 108 (90 Base) MCG/ACT inhaler Inhale 2 puffs into the lungs every 6 (six) hours as needed for wheezing or shortness of breath. 1 Inhaler 6  . albuterol (PROVENTIL) (2.5 MG/3ML) 0.083% nebulizer solution Take 3 mLs (2.5 mg total) by nebulization every 6 (six) hours as needed for wheezing. DX  491.9 360 mL 12  . ALPRAZolam (XANAX) 0.5 MG tablet Take 0.5 mg by mouth 3 (three) times daily as needed for anxiety or sleep.     [name] atorvastatin (LIPITOR) 40 MG tablet Take 1 tablet (40 mg total) by mouth at bedtime. 30 tablet 0  . azelastine (ASTELIN) 0.1 % nasal spray Place 1 spray into both nostrils 2 (two) times daily. Use in each nostril as directed    . benzonatate (TESSALON) 200 MG capsule TAKE 1 CAPSULE 3 TIMES DAILY AS NEEDED FOR COUGH 15 capsule 2  . bismuth subsalicylate (PEPTO BISMOL) 262 MG/15ML suspension Take 30 mLs by mouth every 6 (six) hours as needed for diarrhea or loose stools.    . calcium carbonate (OS-CAL) 600 MG TABS Take 600 mg by mouth at bedtime.     . cholecalciferol (VITAMIN D) 1000 UNITS tablet Take 1,000 Units by mouth at bedtime.     [name] ELIQUIS 5 MG TABS tablet TAKE ONE TABLET BY MOUTH TWICE DAILY 60 tablet 6  . EPIPEN 2-PAK 0.3  MG/0.3ML SOAJ injection USE AS DIRECTED 1 Device 6  . ferrous sulfate 325 (65 FE) MG tablet Take 1 tablet (325 mg total) by mouth 2 (two) times daily. 60 tablet 1  . furosemide (LASIX) 20 MG tablet Take 1 tablet (20 mg total) by mouth daily. 90 tablet 2  . isosorbide mononitrate (IMDUR) 30 MG 24 hr tablet Take 1 tablet (30 mg total) by mouth daily. 90 tablet 3  . levalbuterol (XOPENEX HFA) 45 MCG/ACT inhaler Inhale 2 puffs every 6 hours- rescue 1 Inhaler 12  . losartan (COZAAR) 50 MG tablet Take 1 tablet (50 mg total) by mouth daily. (Patient taking differently:  Take 50 mg by mouth at bedtime. ) 90 tablet 3  . montelukast (SINGULAIR) 10 MG tablet Take 1 tablet (10 mg total) by mouth at bedtime. (Patient taking differently: Take 10 mg by mouth every morning. ) 30 tablet 11  . PARoxetine (PAXIL) 20 MG tablet Take 20 mg by mouth at bedtime.     [name] SPIRIVA HANDIHALER 18 MCG inhalation capsule PLACE ONE CAPSULE IN INHALER AND INHALE DAILY 30 capsule 6  . vitamin B-12 (CYANOCOBALAMIN) 250 MCG tablet Take 250 mcg by mouth at bedtime.      No current facility-administered medications for this visit.    Facility-Administered Medications Ordered in Other Visits  Medication Dose Route Frequency Provider Last Rate Last Dose  . tranexamic acid (CYKLOKAPRON) 2,000 mg in sodium chloride 0.9 % 50 mL Topical Application  0,960 mg Topical Once Cecilio Asper, [employer], PA-C        Allergies:   Bee venom; Ceclor [cefaclor]; Penicillins; Pneumococcal vaccines; Codeine; Cortisone; and Boniva [ibandronate sodium]   Social History:  The patient  reports that she quit smoking about 19 months ago. Her smoking use included cigarettes. She has a 25.00 pack-year smoking history. She quit smokeless tobacco use about 5 years ago. Her smokeless tobacco use included snuff and chew. She reports that she drinks alcohol. She reports that she does not use drugs.   Family History:  The patient's family history includes Breast cancer in her  maternal aunt, maternal grandmother, and mother; Colon cancer (age of onset: 19) in her brother; Colon polyps in her brother; Diabetes in her maternal grandfather; Heart attack in her father; Heart disease in her unknown relative; Heart failure in her brother and father; Kidney disease in her unknown relative; Stroke in her mother; Transient ischemic attack in her mother.    ROS:  Please see the history of present illness.   Otherwise, review of systems is positive for leg swelling, chest pain, facial swelling, cough, dyspnea on exertion, wheezing, nausea, anxiety, joint swelling, back pain.   All other systems are reviewed and negative.   PHYSICAL EXAM: VS:  BP 102/62   Pulse (!) 55   Ht 5' 0.5" (1.537 m)   Wt 178 lb (80.7 kg)   SpO2 98%   BMI 34.19 kg/m  , BMI Body mass index is 34.19 kg/m. GEN: Well nourished, well developed, in no acute distress  HEENT: normal  Neck: no JVD, carotid bruits, or masses Cardiac: RRR; no murmurs, rubs, or gallops,no edema  Respiratory:  clear to auscultation bilaterally, normal work of breathing GI: soft, nontender, nondistended, + BS MS: no deformity or atrophy  Skin: warm and dry Neuro:  Strength and sensation are intact Psych: euthymic mood, full affect  EKG:  EKG is ordered today. Personal review of the ekg ordered shows SR, rate 55   Recent Labs: No results found for requested labs within last 8760 hours.    Lipid Panel     Component Value Date/Time   CHOL 127 02/25/2016 0313   CHOL 129 12/07/2012 1445   TRIG 173 (H) 02/25/2016 0313   TRIG 147 12/07/2012 1445   HDL 25 (L) 02/25/2016 0313   HDL 40 12/07/2012 1445   CHOLHDL 5.1 02/25/2016 0313   VLDL 35 02/25/2016 0313   LDLCALC 67 02/25/2016 0313   LDLCALC 60 12/07/2012 1445     Wt Readings from Last 3 Encounters:  08/08/17 178 lb (80.7 kg)  03/20/17 182 lb 8 oz (82.8 kg)  03/09/17 181 lb 12.8 oz (82.5  kg)      Other studies Reviewed: Additional studies/ records that were  reviewed today include: TTE 03/02/17 Review of the above records today demonstrates:  - Left ventricle: Distal septal hypokinesis. The cavity size was   normal. Systolic function was normal. The estimated ejection   fraction was 55%. Wall motion was normal; there were no regional   wall motion abnormalities. Left ventricular diastolic function   parameters were normal. - Atrial septum: No defect or patent foramen ovale was identified. - Pulmonary arteries: PA peak pressure: 34 mm Hg (S).  02/23/16 SPECT  The left ventricular ejection fraction is normal (55-65%).  Nuclear stress EF: 60%.  There was no ST segment deviation noted during stress.  No T wave inversion was noted during stress.  Defect 1: There is a small defect of moderate severity present in the apical anterior and apex location. Consistent with breast attenuation and unchanged from 2015.  The study is normal.  This is a low risk study.   30 day monitor 04/26/17 - personally reviewed Sinus rhythm, sinus tachycardia, sinus bradycardia, occasional PVCs seen Zero atrial fibrillation Symptoms of chest pain, chest tightness associated with sinus rhythm  ASSESSMENT AND PLAN:  1.  Paroxysmal atrial fibrillation: AF ablation 08/03/16.  Has not had any further atrial fibrillation.  Currently on Eliquis.  No changes.   This patients CHA2DS2-VASc Score and unadjusted Ischemic Stroke Rate (% per year) is equal to 3.2 % stroke rate/year from a score of 3  Above score calculated as 1 point each if present [CHF, HTN, DM, Vascular=MI/PAD/Aortic Plaque, Age if 65-74, or Female] Above score calculated as 2 points each if present [Age > 75, or Stroke/TIA/TE]  2. Hypertension: Well-controlled today  3. takotsubo cardiomyopathy: Currently on Lasix and ARB.  No changes.  4. Syncope: Monitor showed no rhythm abnormalities.  No further episodes of syncope.  No changes.  Current medicines are reviewed at length with the patient today.     The patient does not have concerns regarding her medicines.  The following changes were made today:  none  Labs/ tests ordered today include:  Orders Placed This Encounter  Procedures  . EKG 12-Lead  . EKG 12-Lead     Disposition:   FU with Will Camnitz 6 months  Signed, Will Meredith Leeds, MD  08/08/2017 11:34 AM     [facility] [address] [employer] [place] [id number] [phone] (office) [phone] (fax)

## 2017-08-08 NOTE — Telephone Encounter (Signed)
That is fine with me.

## 2017-08-08 NOTE — Telephone Encounter (Signed)
Pt came in for OV w/ Dr. Curt Bears today. Pt would like Dr. Curt Bears to follow all of her cardiology needs. Dr. Curt Bears agreeable. Will forward to Dr. Acie Fredrickson for approval.

## 2017-08-10 NOTE — Telephone Encounter (Signed)
Informed patient that Dr. Curt Bears would be her primary cardiologist. She thanks me for letting her know.

## 2017-08-10 NOTE — Telephone Encounter (Signed)
Mrs.Langsam is returning your call. Thanks

## 2017-08-16 NOTE — Telephone Encounter (Signed)
Patient cancelled appt due to Heart MD advising that she did not need to keep it.

## 2017-08-22 ENCOUNTER — Ambulatory Visit: Payer: Medicaid Other | Admitting: Adult Health

## 2017-09-04 ENCOUNTER — Other Ambulatory Visit: Payer: Self-pay | Admitting: Cardiology

## 2017-09-11 ENCOUNTER — Ambulatory Visit: Payer: Medicaid Other | Admitting: Internal Medicine

## 2017-09-24 ENCOUNTER — Other Ambulatory Visit: Payer: Self-pay | Admitting: Cardiology

## 2017-12-14 ENCOUNTER — Other Ambulatory Visit: Payer: Self-pay | Admitting: Internal Medicine

## 2017-12-31 IMAGING — CR DG KNEE 1-2V*L*
4 series · 4 of 4 positions shown · non-contrast
Comparison: Radiograph dated 01/17/13

CLINICAL DATA: 65-year-old female with left knee swelling

EXAM:
LEFT KNEE - 1-2 VIEW

[t knee lat left]
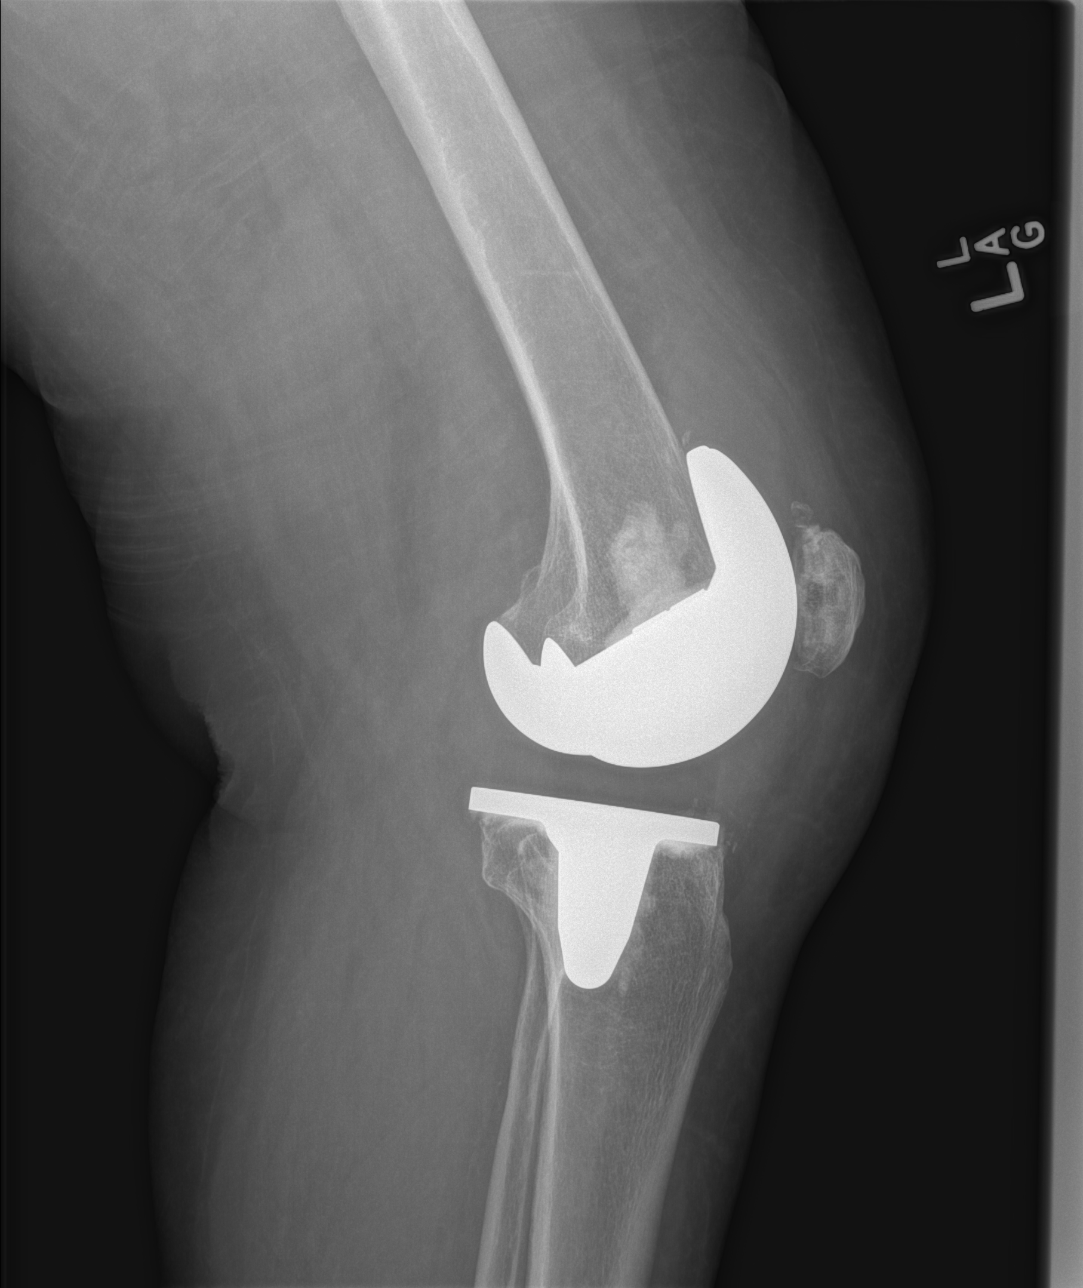

[t knee ap left]
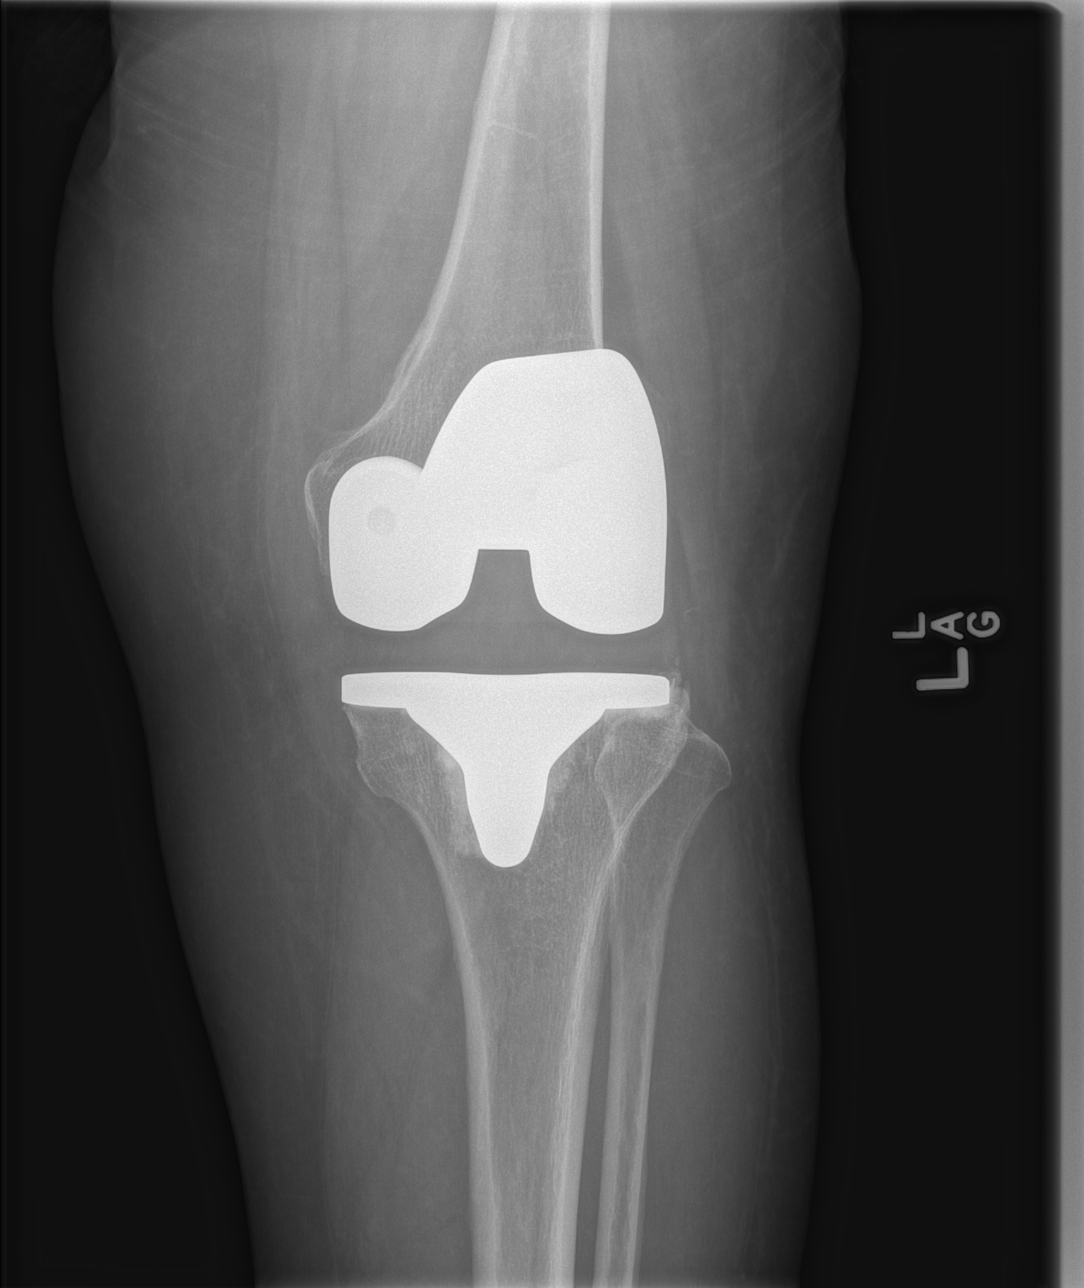

[t knee obl left (1 of 2)]
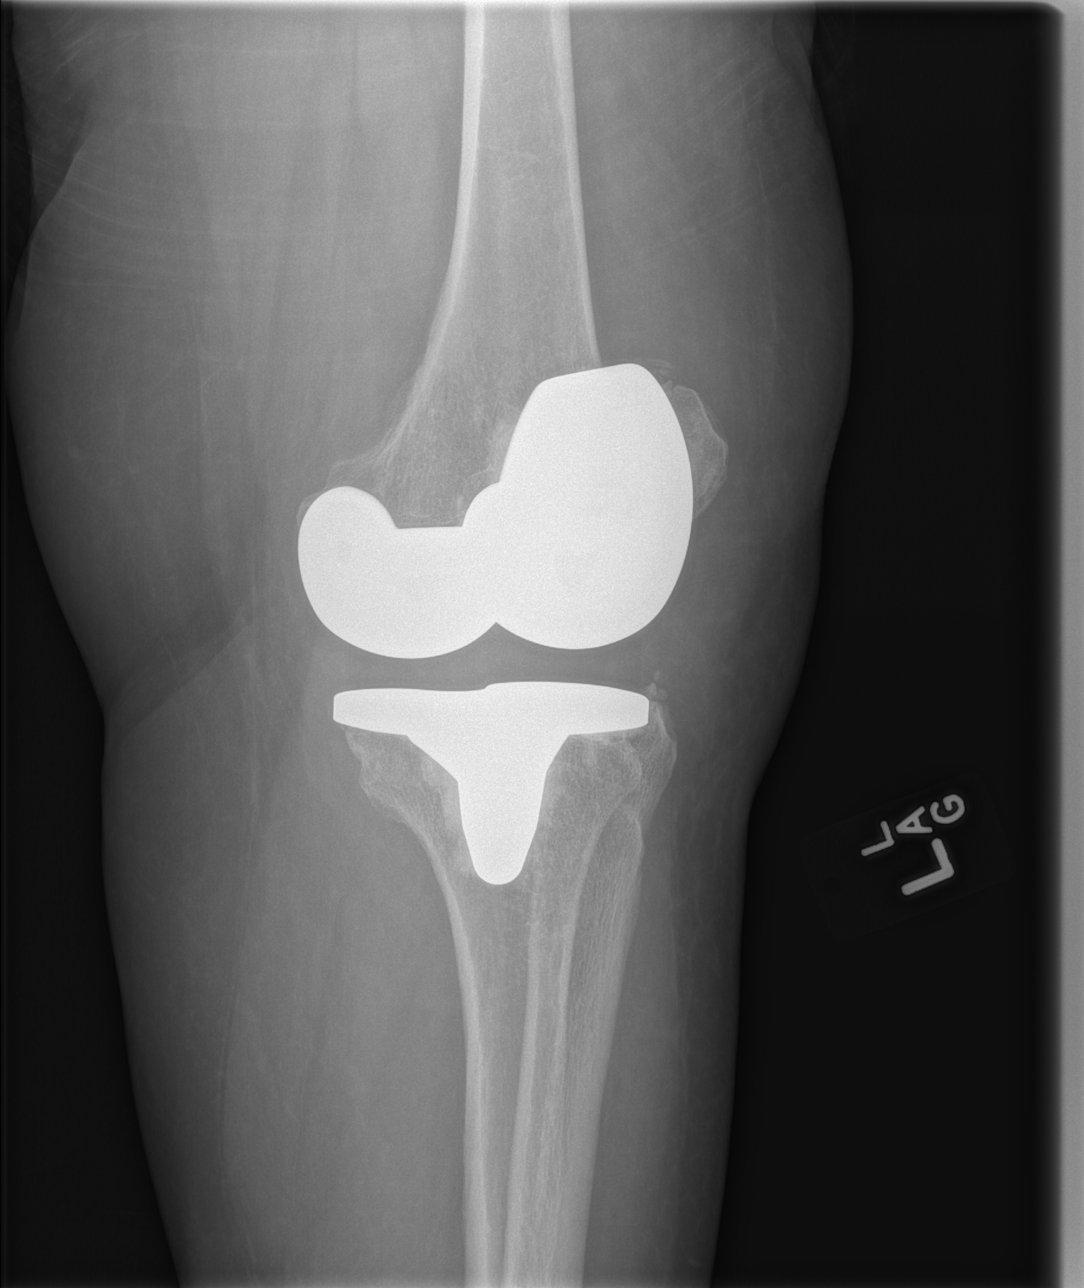

[t knee obl left (2 of 2)]
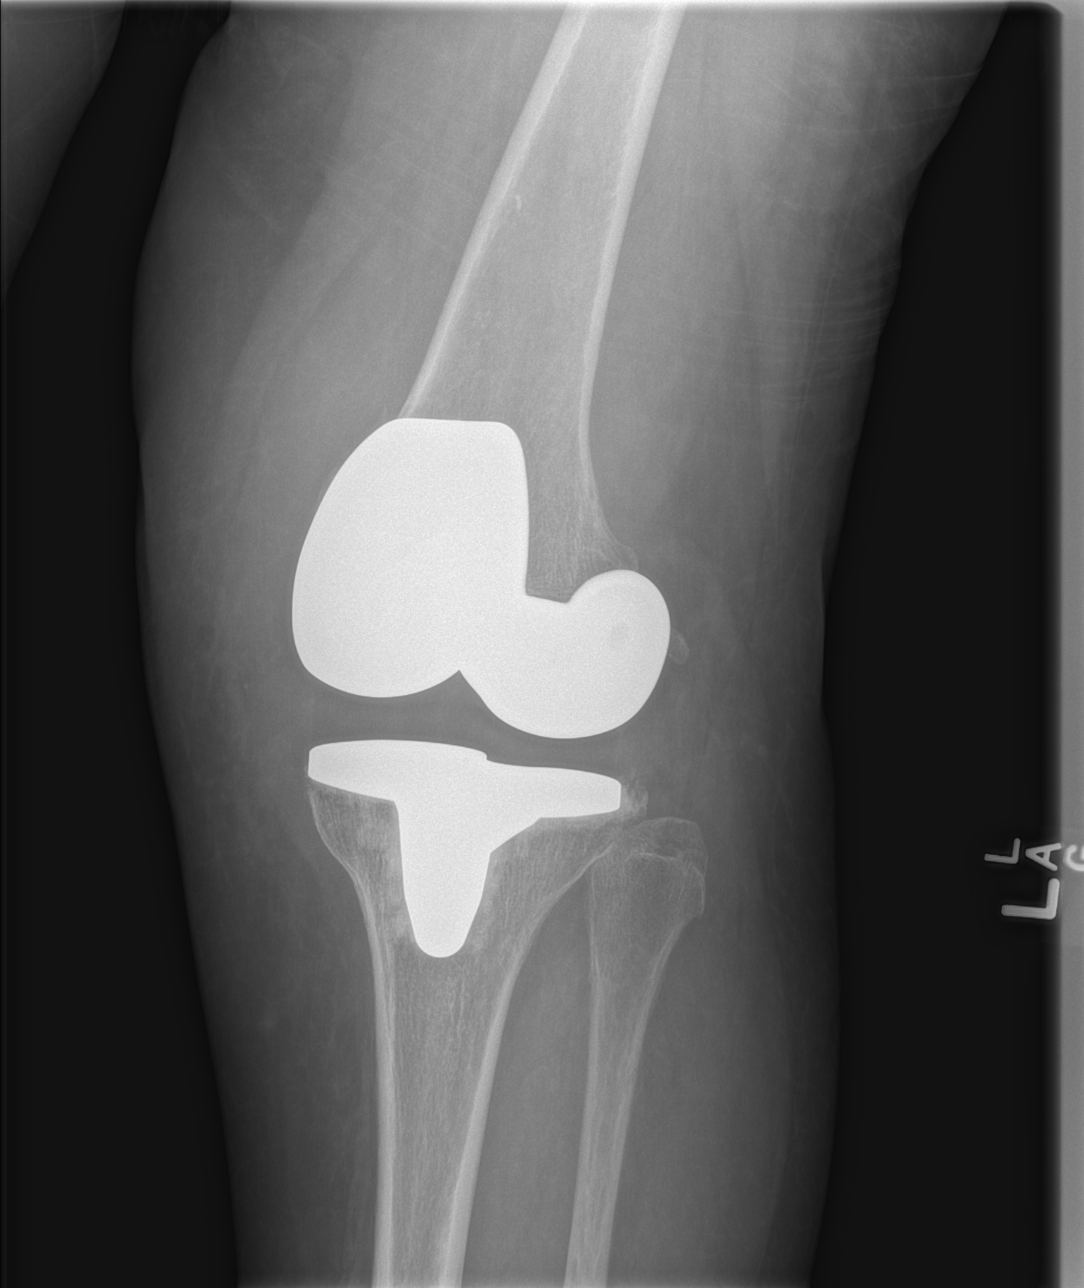

[4 of 4 positions shown; findings below may reference images not displayed]

FINDINGS: There is a total left knee arthroplasty. There is no acute fracture
or dislocation. No abnormal lucency noted to suggest hardware
loosening. The joint space is maintained. The bones are osteopenic.
There is a small suprapatellar effusion. There is mild induration of
the Hoffa's fat pad. The soft tissues appear unremarkable.
IMPRESSION: No acute fracture or dislocation.

Total left knee arthroplasty appears unremarkable.

Small suprapatellar joint effusion as well as mild induration of the
Hoffa's fat pad.

## 2018-01-19 ENCOUNTER — Other Ambulatory Visit: Payer: Self-pay | Admitting: Cardiology

## 2018-01-19 NOTE — Telephone Encounter (Signed)
Eliquis 5mg  refill request received; pt is 67 yrs old, wt-80.7kg, Crea-0.80 on 01/04/18 via PCP, last seen by Dr. Curt Bears on 08/08/17; will send in refill to requested pharmacy.

## 2018-01-29 ENCOUNTER — Other Ambulatory Visit: Payer: Self-pay

## 2018-01-29 MED ORDER — APIXABAN 5 MG PO TABS: 5.0000 mg | ORAL_TABLET | Freq: Two times a day (BID) | ORAL | 5 refills | Status: DC

## 2018-01-29 NOTE — Telephone Encounter (Addendum)
Eliquis 5mg  refill request received; pt is 67 yrs old, wt-80.7kg, Crea-0.80 on 01/04/18 via [name], last seen by Dr. Curt Bears on 08/08/17; will send in refill to requested pharmacy.

## 2018-02-06 ENCOUNTER — Encounter: Payer: Self-pay | Admitting: Cardiology

## 2018-02-18 ENCOUNTER — Other Ambulatory Visit: Payer: Self-pay | Admitting: Internal Medicine

## 2018-02-21 ENCOUNTER — Ambulatory Visit: Payer: Medicaid Other | Admitting: Cardiology

## 2018-03-12 ENCOUNTER — Ambulatory Visit (INDEPENDENT_AMBULATORY_CARE_PROVIDER_SITE_OTHER): Payer: Medicare Other | Admitting: Cardiology

## 2018-03-12 ENCOUNTER — Encounter: Payer: Self-pay | Admitting: Cardiology

## 2018-03-12 VITALS — BP 120/70 | HR 61 | Ht 60.5 in | Wt 177.4 lb

## 2018-03-12 DIAGNOSIS — R0602 Shortness of breath: Secondary | ICD-10-CM | POA: Diagnosis not present

## 2018-03-12 DIAGNOSIS — Z79899 Other long term (current) drug therapy: Secondary | ICD-10-CM

## 2018-03-12 DIAGNOSIS — I10 Essential (primary) hypertension: Secondary | ICD-10-CM | POA: Diagnosis not present

## 2018-03-12 DIAGNOSIS — I5021 Acute systolic (congestive) heart failure: Secondary | ICD-10-CM

## 2018-03-12 DIAGNOSIS — I480 Paroxysmal atrial fibrillation: Secondary | ICD-10-CM

## 2018-03-12 DIAGNOSIS — R079 Chest pain, unspecified: Secondary | ICD-10-CM | POA: Diagnosis not present

## 2018-03-12 MED ORDER — LOSARTAN POTASSIUM 50 MG PO TABS: 50.0000 mg | ORAL_TABLET | Freq: Every day | ORAL | 2 refills | Status: DC

## 2018-03-12 MED ORDER — ISOSORBIDE MONONITRATE ER 30 MG PO TB24: 30.0000 mg | ORAL_TABLET | Freq: Every day | ORAL | 2 refills | Status: DC

## 2018-03-12 MED ORDER — FUROSEMIDE 20 MG PO TABS: 20.0000 mg | ORAL_TABLET | Freq: Every day | ORAL | 2 refills | Status: DC

## 2018-03-12 MED ORDER — APIXABAN 5 MG PO TABS: 5.0000 mg | ORAL_TABLET | Freq: Two times a day (BID) | ORAL | 1 refills | Status: DC

## 2018-03-12 NOTE — Patient Instructions (Addendum)
Medication Instructions:  Your physician recommends that you continue on your current medications as directed. Please refer to the Current Medication list given to you today.  * If you need a refill on your cardiac medications before your next appointment, please call your pharmacy.   Labwork: Today: BMET & CBC  Testing/Procedures: Your physician has requested that you have an echocardiogram. Echocardiography is a painless test that uses sound waves to create images of your heart. It provides your doctor with information about the size and shape of your heart and how well your heart's chambers and valves are working. This procedure takes approximately one hour. There are no restrictions for this procedure.  Follow-Up: Your physician wants you to follow-up in: 6 months with Dr. Curt Bears.  You will receive a reminder letter in the mail two months in advance. If you don't receive a letter, please call our office to schedule the follow-up appointment.  *Please note that any paperwork needing to be filled out by the provider will need to be addressed at the front desk prior to seeing the provider. Please note that any FMLA, disability or other documents regarding health condition is subject to a $25.00 charge that must be received prior to completion of paperwork in the form of a money order or check.  Thank you for choosing [facility]!!   Trinidad Curet, RN [phone]  Any Other Special Instructions Will Be Listed Below (If Applicable).  Echocardiogram An echocardiogram, or echocardiography, uses sound waves (ultrasound) to produce an image of your heart. The echocardiogram is simple, painless, obtained within a short period of time, and offers valuable information to your health care provider. The images from an echocardiogram can provide information such as:  Evidence of coronary artery disease (CAD).  Heart size.  Heart muscle function.  Heart valve function.  Aneurysm  detection.  Evidence of a past heart attack.  Fluid buildup around the heart.  Heart muscle thickening.  Assess heart valve function.  Tell a health care provider about:  Any allergies you have.  All medicines you are taking, including vitamins, herbs, eye drops, creams, and over-the-counter medicines.  Any problems you or family members have had with anesthetic medicines.  Any blood disorders you have.  Any surgeries you have had.  Any medical conditions you have.  Whether you are pregnant or may be pregnant. What happens before the procedure? No special preparation is needed. Eat and drink normally. What happens during the procedure?  In order to produce an image of your heart, gel will be applied to your chest and a wand-like tool (transducer) will be moved over your chest. The gel will help transmit the sound waves from the transducer. The sound waves will harmlessly bounce off your heart to allow the heart images to be captured in real-time motion. These images will then be recorded.  You may need an IV to receive a medicine that improves the quality of the pictures. What happens after the procedure? You may return to your normal schedule including diet, activities, and medicines, unless your health care provider tells you otherwise. This information is not intended to replace advice given to you by your health care provider. Make sure you discuss any questions you have with your health care provider. Document Released: 07/15/2000 Document Revised: 03/05/2016 Document Reviewed: 03/25/2013 Elsevier Interactive Patient Education  2017 [employer].

## 2018-03-12 NOTE — Progress Notes (Signed)
Electrophysiology Office Note   Date:  03/12/2018   ID:  [name], [name] 1950/10/01, MRN [id number]  PCP:  Velna Hatchet, MD  Cardiologist:  Nahser Primary Electrophysiologist:  Rimas Gilham Meredith Leeds, MD    No chief complaint on file.    History of Present Illness: [name] [name] is a 67 y.o. female who presents today for electrophysiology evaluation.   Hx paroxysmal atrial fibrillaiton, Takotsubo, HLD. Her EF has normalized from a low of 15-20%. She was admitted to the hospital in July with chest pain, and was found to have atrial fibrillation with RVR. She had a stress test which was a low risk study. An echo at the time showed that her EF was normal. Since being seen, she has been maintained on flecainide. She has been compliant with her anticoagulation. AF ablation 08/03/16.  Today, denies symptoms of palpitations, lower extremity edema, claudication, dizziness, presyncope, syncope, bleeding, or neurologic sequela. The patient is tolerating medications without difficulties.  Overall she is doing fair.  She does have some chest pain which occurs in the center of her chest and occurs with exertion.  Her pain lasts a few seconds at a time.  He says that it is in the center of her chest over her sternum.  She otherwise feels weak, fatigue, and shortness of breath.  Her shortness of breath mainly occurs when she is exerting herself, but also when she lies flat in bed.  Has woken up in the past feeling short of breath.    Past Medical History:  Diagnosis Date  . Anemia    hx of years ago   . Anxiety   . Aortic valve disorders   . Arthritis   . Asthma   . Barrett esophagus   . Bipolar disorder ([place])   . CHF (congestive heart failure) ([place])   . Chronic bronchitis ([place])   . Chronic kidney disease    "I don't have but 1" (08/03/2016)  . COPD (chronic obstructive pulmonary disease) ([place])    "just have a touch" (08/03/2016)  . Depression   . Esophageal reflux   . Esophageal  stricture   . Fibromyalgia    "hands, knees, elbows; back; think it's in my hips" (08/03/2016)  . GAD (generalized anxiety disorder)   . GERD (gastroesophageal reflux disease)   . Hiatal hernia   . History of blood transfusion 1970s; 01/08/2013   "low blood count"  . History of kidney stones 01-08-13   past hx.  . History of stomach ulcers    "some have bleed"  . Hyperlipemia   . Hypertension 12/07/2012  . Insomnia, unspecified   . Myocardial infarction [facility]) 01-08-13   '06-Chest pain-no stent-dx. MI-stress related  . Osteoporosis   . Personal history of colonic polyps 09/2010   TUBULAR ADENOMAS (X3); NEGATIVE FOR HIGH GRADE DYSPLASIA OR MALIGNANCY.  [name] Pneumonia    "several times" (08/03/2016)  . Stroke [facility])    "2 or 3"; remains with some right sided weaknes (08/03/2016)  . Tremor, essential 03/20/2017  . Type II diabetes mellitus ([place])   . Urinary frequency    AND INCONTINENCE   Past Surgical History:  Procedure Laterality Date  . ATRIAL FIBRILLATION ABLATION  08/03/2016  . CARDIAC CATHETERIZATION     normal per Dr Acie Fredrickson in note dated 02/06/12   . CARDIAC CATHETERIZATION     "I've had 2 or 3" (08/03/2016)  . CARPAL TUNNEL RELEASE Right   . CATARACT EXTRACTION W/ INTRAOCULAR LENS  IMPLANT, BILATERAL    .  COLONOSCOPY W/ BIOPSIES AND POLYPECTOMY    . DILATION AND CURETTAGE OF UTERUS     S/P miscarriage  . ELECTROPHYSIOLOGIC STUDY N/A 08/03/2016   Procedure: Atrial Fibrillation Ablation;  Surgeon: Rayfield Beem Meredith Leeds, MD;  Location: [place] [facility];  Service: Cardiovascular;  Laterality: N/A;  . ESOPHAGEAL DILATION  01-08-13   2 yrs ago  . HEEL SPUR SURGERY Left 2005  . KNEE ARTHROSCOPY  02/28/2012   Procedure: ARTHROSCOPY KNEE;  Surgeon: Tobi Bastos, MD;  Location: [facility];  Service: Orthopedics;  Laterality: Left;  . LAPAROSCOPIC CHOLECYSTECTOMY  03/2002  . SHOULDER OPEN ROTATOR CUFF REPAIR Right 2011  . TONSILLECTOMY AND ADENOIDECTOMY  1965  . TOTAL KNEE ARTHROPLASTY Left  01/17/2013   Procedure: LEFT TOTAL KNEE ARTHROPLASTY;  Surgeon: Tobi Bastos, MD;  Location: [facility];  Service: Orthopedics;  Laterality: Left;  . TOTAL KNEE ARTHROPLASTY Right 06/19/2014   Procedure: RIGHT TOTAL KNEE ARTHROPLASTY;  Surgeon: Tobi Bastos, MD;  Location: [facility];  Service: Orthopedics;  Laterality: Right;  . TUBAL LIGATION    . VAGINAL HYSTERECTOMY     "partial"  . WRIST FLEXION TENDON TENOTOMIES AND PROXIMAL CORPECTOMY W/ WRIST ARTHRODESIS&ILIAC CREST BONE GRAFT       Current Outpatient Medications  Medication Sig Dispense Refill  . acetaminophen (TYLENOL) 325 MG tablet Take 1-2 tablets (325-650 mg total) by mouth every 4 (four) hours as needed for mild pain.    [name] albuterol (PROVENTIL) (2.5 MG/3ML) 0.083% nebulizer solution Take 3 mLs (2.5 mg total) by nebulization every 6 (six) hours as needed for wheezing. DX  491.9 360 mL 12  . ALPRAZolam (XANAX) 0.5 MG tablet Take 0.5 mg by mouth 3 (three) times daily as needed for anxiety or sleep.     [name] apixaban (ELIQUIS) 5 MG TABS tablet Take 1 tablet (5 mg total) by mouth 2 (two) times daily. 180 tablet 1  . atorvastatin (LIPITOR) 40 MG tablet Take 1 tablet (40 mg total) by mouth at bedtime. 30 tablet 0  . azelastine (ASTELIN) 0.1 % nasal spray Place 1 spray into both nostrils 2 (two) times daily. Use in each nostril as directed    . benzonatate (TESSALON) 200 MG capsule Take 200 mg by mouth 3 (three) times daily as needed for cough.    . bismuth subsalicylate (PEPTO BISMOL) 262 MG/15ML suspension Take 30 mLs by mouth every 6 (six) hours as needed for diarrhea or loose stools.    . calcium carbonate (OS-CAL) 600 MG TABS Take 600 mg by mouth at bedtime.     . cholecalciferol (VITAMIN D) 1000 UNITS tablet Take 1,000 Units by mouth at bedtime.     [name] EPIPEN 2-PAK 0.3 MG/0.3ML SOAJ injection USE AS DIRECTED 1 Device 6  . ferrous sulfate 325 (65 FE) MG tablet Take 1 tablet (325 mg total) by mouth 2 (two) times daily. 60 tablet 1  .  furosemide (LASIX) 20 MG tablet Take 1 tablet (20 mg total) by mouth daily. 90 tablet 2  . isosorbide mononitrate (IMDUR) 30 MG 24 hr tablet Take 1 tablet (30 mg total) by mouth daily. 90 tablet 2  . losartan (COZAAR) 50 MG tablet Take 1 tablet (50 mg total) by mouth daily. 90 tablet 2  . montelukast (SINGULAIR) 10 MG tablet Take 1 tablet (10 mg total) by mouth at bedtime. 30 tablet 11  . PARoxetine (PAXIL) 30 MG tablet Take 30 mg by mouth daily.  1  . PROAIR HFA 108 (90 Base) MCG/ACT  inhaler INHALE TWO PUFFS INTO THE LUNGS EVERY 6 HOURS AS NEEDED FOR WHEEZING OR SHORTNESS OF BREATH 8.5 g 11  . SPIRIVA HANDIHALER 18 MCG inhalation capsule PLACE ONE CAPSULE IN INHALER AND INHALE DAILY 30 capsule 6  . vitamin B-12 (CYANOCOBALAMIN) 250 MCG tablet Take 250 mcg by mouth at bedtime.      No current facility-administered medications for this visit.    Facility-Administered Medications Ordered in Other Visits  Medication Dose Route Frequency Provider Last Rate Last Dose  . tranexamic acid (CYKLOKAPRON) 2,000 mg in sodium chloride 0.9 % 50 mL Topical Application  6,295 mg Topical Once Cecilio Asper, [employer], PA-C        Allergies:   Bee venom; Ceclor [cefaclor]; Penicillins; Pneumococcal vaccines; Codeine; Cortisone; and Boniva [ibandronate sodium]   Social History:  The patient  reports that she quit smoking about 2 years ago. Her smoking use included cigarettes. She has a 25.00 pack-year smoking history. She quit smokeless tobacco use about 6 years ago.  Her smokeless tobacco use included snuff and chew. She reports that she drinks alcohol. She reports that she does not use drugs.   Family History:  The patient's family history includes Breast cancer in her maternal aunt, maternal grandmother, and mother; Colon cancer (age of onset: 43) in her brother; Colon polyps in her brother; Diabetes in her maternal grandfather; Heart attack in her father; Heart disease in her unknown relative; Heart failure in her  brother and father; Kidney disease in her unknown relative; Stroke in her mother; Transient ischemic attack in her mother.    ROS:  Please see the history of present illness.   Otherwise, review of systems is positive for chest pressure, leg swelling, shortness of breath, palpitations, headaches.   All other systems are reviewed and negative.   PHYSICAL EXAM: VS:  BP 120/70   Pulse 61   Ht 5' 0.5" (1.537 m)   Wt 177 lb 6.4 oz (80.5 kg)   SpO2 97%   BMI 34.08 kg/m  , BMI Body mass index is 34.08 kg/m. GEN: Well nourished, well developed, in no acute distress  HEENT: normal  Neck: no JVD, carotid bruits, or masses Cardiac: RRR; no murmurs, rubs, or gallops,no edema  Respiratory:  clear to auscultation bilaterally, normal work of breathing GI: soft, nontender, nondistended, + BS MS: no deformity or atrophy, pain to palpation in the center of the chest Skin: warm and dry Neuro:  Strength and sensation are intact Psych: euthymic mood, full affect  EKG:  EKG is ordered today. Personal review of the ekg ordered shows sinus rhythm, rate 61, left axis deviation  Recent Labs: No results found for requested labs within last 8760 hours.    Lipid Panel     Component Value Date/Time   CHOL 127 02/25/2016 0313   CHOL 129 12/07/2012 1445   TRIG 173 (H) 02/25/2016 0313   TRIG 147 12/07/2012 1445   HDL 25 (L) 02/25/2016 0313   HDL 40 12/07/2012 1445   CHOLHDL 5.1 02/25/2016 0313   VLDL 35 02/25/2016 0313   LDLCALC 67 02/25/2016 0313   LDLCALC 60 12/07/2012 1445     Wt Readings from Last 3 Encounters:  03/12/18 177 lb 6.4 oz (80.5 kg)  08/08/17 178 lb (80.7 kg)  03/20/17 182 lb 8 oz (82.8 kg)      Other studies Reviewed: Additional studies/ records that were reviewed today include: TTE 03/02/17 Review of the above records today demonstrates:  - Left ventricle: Distal septal hypokinesis.  The cavity size was   normal. Systolic function was normal. The estimated ejection    fraction was 55%. Wall motion was normal; there were no regional   wall motion abnormalities. Left ventricular diastolic function   parameters were normal. - Atrial septum: No defect or patent foramen ovale was identified. - Pulmonary arteries: PA peak pressure: 34 mm Hg (S).  02/23/16 SPECT  The left ventricular ejection fraction is normal (55-65%).  Nuclear stress EF: 60%.  There was no ST segment deviation noted during stress.  No T wave inversion was noted during stress.  Defect 1: There is a small defect of moderate severity present in the apical anterior and apex location. Consistent with breast attenuation and unchanged from 2015.  The study is normal.  This is a low risk study.   30 day monitor 04/26/17 - personally reviewed Sinus rhythm, sinus tachycardia, sinus bradycardia, occasional PVCs seen Zero atrial fibrillation Symptoms of chest pain, chest tightness associated with sinus rhythm  ASSESSMENT AND PLAN:  1.  Paroxysmal atrial fibrillation: AF ablation 08/03/2016.  No recurrence.  Continue Eliquis.  This patients CHA2DS2-VASc Score and unadjusted Ischemic Stroke Rate (% per year) is equal to 3.2 % stroke rate/year from a score of 3  Above score calculated as 1 point each if present [CHF, HTN, DM, Vascular=MI/PAD/Aortic Plaque, Age if 65-74, or Female] Above score calculated as 2 points each if present [Age > 75, or Stroke/TIA/TE]    2. Hypertension: Well-controlled today  3. takotsubo cardiomyopathy: Currently on Lasix and ARB.  She is continuing to have shortness of breath which also occurs when she is lying flat.  We [name] [name] repeat an echocardiogram.  4. Syncope: No rhythm abnormalities noted.  No further episodes.  No changes.  5.  Chest pain: Appears to be musculoskeletal in nature.  Pain is worse with coughing as well as with palpation in the center of the chest which completely reproduces the discomfort.  Current medicines are reviewed at length with the  patient today.   The patient does not have concerns regarding her medicines.  The following changes were made today: None  Labs/ tests ordered today include:  Orders Placed This Encounter  Procedures  . Basic Metabolic Panel (BMET)  . CBC  . EKG 12-Lead  . ECHOCARDIOGRAM COMPLETE     Disposition:   FU with [name] [name] 6 months  Signed, [name] Vancleve Meredith Leeds, MD  03/12/2018 2:12 PM     [place] [address] [phone] (office) [phone] (fax)

## 2018-03-16 ENCOUNTER — Other Ambulatory Visit: Payer: Self-pay

## 2018-03-16 ENCOUNTER — Ambulatory Visit (HOSPITAL_COMMUNITY): Payer: Medicare Other | Attending: Cardiology

## 2018-03-16 DIAGNOSIS — J449 Chronic obstructive pulmonary disease, unspecified: Secondary | ICD-10-CM | POA: Diagnosis not present

## 2018-03-16 DIAGNOSIS — R0602 Shortness of breath: Secondary | ICD-10-CM

## 2018-03-16 DIAGNOSIS — I480 Paroxysmal atrial fibrillation: Secondary | ICD-10-CM | POA: Diagnosis present

## 2018-03-16 DIAGNOSIS — E785 Hyperlipidemia, unspecified: Secondary | ICD-10-CM | POA: Diagnosis not present

## 2018-03-16 DIAGNOSIS — I10 Essential (primary) hypertension: Secondary | ICD-10-CM | POA: Insufficient documentation

## 2018-03-16 DIAGNOSIS — R079 Chest pain, unspecified: Secondary | ICD-10-CM | POA: Diagnosis present

## 2018-03-16 DIAGNOSIS — E119 Type 2 diabetes mellitus without complications: Secondary | ICD-10-CM | POA: Insufficient documentation

## 2018-03-24 ENCOUNTER — Emergency Department (HOSPITAL_COMMUNITY): Payer: Medicare Other

## 2018-03-24 ENCOUNTER — Other Ambulatory Visit: Payer: Self-pay

## 2018-03-24 ENCOUNTER — Encounter (HOSPITAL_COMMUNITY): Payer: Self-pay

## 2018-03-24 ENCOUNTER — Observation Stay (HOSPITAL_COMMUNITY)
Admission: EM | Admit: 2018-03-24 | Discharge: 2018-03-25 | Disposition: A | Discharge status: Home / Self Care | Payer: Medicare Other | Attending: Internal Medicine | Admitting: Internal Medicine

## 2018-03-24 DIAGNOSIS — R0789 Other chest pain: Secondary | ICD-10-CM | POA: Diagnosis not present

## 2018-03-24 DIAGNOSIS — Z87891 Personal history of nicotine dependence: Secondary | ICD-10-CM | POA: Diagnosis not present

## 2018-03-24 DIAGNOSIS — E119 Type 2 diabetes mellitus without complications: Secondary | ICD-10-CM | POA: Diagnosis not present

## 2018-03-24 DIAGNOSIS — I5181 Takotsubo syndrome: Secondary | ICD-10-CM | POA: Insufficient documentation

## 2018-03-24 DIAGNOSIS — I5021 Acute systolic (congestive) heart failure: Secondary | ICD-10-CM | POA: Insufficient documentation

## 2018-03-24 DIAGNOSIS — J449 Chronic obstructive pulmonary disease, unspecified: Secondary | ICD-10-CM | POA: Diagnosis not present

## 2018-03-24 DIAGNOSIS — Z8673 Personal history of transient ischemic attack (TIA), and cerebral infarction without residual deficits: Secondary | ICD-10-CM | POA: Insufficient documentation

## 2018-03-24 DIAGNOSIS — R079 Chest pain, unspecified: Secondary | ICD-10-CM | POA: Diagnosis not present

## 2018-03-24 DIAGNOSIS — I480 Paroxysmal atrial fibrillation: Secondary | ICD-10-CM | POA: Diagnosis not present

## 2018-03-24 DIAGNOSIS — I110 Hypertensive heart disease with heart failure: Secondary | ICD-10-CM | POA: Diagnosis not present

## 2018-03-24 DIAGNOSIS — Z79899 Other long term (current) drug therapy: Secondary | ICD-10-CM | POA: Diagnosis not present

## 2018-03-24 DIAGNOSIS — Z7901 Long term (current) use of anticoagulants: Secondary | ICD-10-CM | POA: Insufficient documentation

## 2018-03-24 HISTORY — DX: Personal history of other medical treatment: Z92.89

## 2018-03-24 HISTORY — DX: Takotsubo syndrome: I51.81

## 2018-03-24 LAB — CBC WITH DIFFERENTIAL/PLATELET
Basophils Absolute: 0 10*3/uL (ref 0.0–0.1)
Basophils Relative: 0 %
Eosinophils Absolute: 0.1 10*3/uL (ref 0.0–0.7)
Eosinophils Relative: 1 %
HEMATOCRIT: 37.3 % (ref 36.0–46.0)
Hemoglobin: 12.6 g/dL (ref 12.0–15.0)
LYMPHS ABS: 1.9 10*3/uL (ref 0.7–4.0)
LYMPHS PCT: 30 %
MCH: 31.6 pg (ref 26.0–34.0)
MCHC: 33.8 g/dL (ref 30.0–36.0)
MCV: 93.5 fL (ref 78.0–100.0)
Monocytes Absolute: 0.6 10*3/uL (ref 0.1–1.0)
Monocytes Relative: 9 %
Neutro Abs: 3.8 10*3/uL (ref 1.7–7.7)
Neutrophils Relative %: 60 %
Platelets: 184 10*3/uL (ref 150–400)
RBC: 3.99 MIL/uL (ref 3.87–5.11)
RDW: 13.5 % (ref 11.5–15.5)
WBC: 6.3 10*3/uL (ref 4.0–10.5)

## 2018-03-24 LAB — TROPONIN I: Troponin I: <0.03 ng/mL (ref <0.03)

## 2018-03-24 LAB — BASIC METABOLIC PANEL
ANION GAP: 10 (ref 5–15)
BUN: 8 mg/dL (ref 8–23)
CO2: 26 mmol/L (ref 22–32)
Calcium: 9.2 mg/dL (ref 8.9–10.3)
Chloride: 106 mmol/L (ref 98–111)
Creatinine, Ser: 0.78 mg/dL (ref 0.44–1.00)
GFR calc Af Amer: >60 mL/min (ref >60)
GFR calc non Af Amer: >60 mL/min (ref >60)
GLUCOSE: 139 mg/dL — AB (ref 70–99)
POTASSIUM: 3.7 mmol/L (ref 3.5–5.1)
Sodium: 142 mmol/L (ref 135–145)

## 2018-03-24 LAB — I-STAT CHEM 8, ED
BUN: 8 mg/dL (ref 8–23)
CREATININE: 0.7 mg/dL (ref 0.44–1.00)
Calcium, Ion: 1.11 mmol/L — ABNORMAL LOW (ref 1.15–1.40)
Chloride: 103 mmol/L (ref 98–111)
Glucose, Bld: 134 mg/dL — ABNORMAL HIGH (ref 70–99)
HEMATOCRIT: 36 % (ref 36.0–46.0)
HEMOGLOBIN: 12.2 g/dL (ref 12.0–15.0)
POTASSIUM: 3.9 mmol/L (ref 3.5–5.1)
Sodium: 141 mmol/L (ref 135–145)
TCO2: 27 mmol/L (ref 22–32)

## 2018-03-24 LAB — PROTIME-INR
INR: 1.12
PROTHROMBIN TIME: 14.3 s (ref 11.4–15.2)

## 2018-03-24 LAB — URINALYSIS, ROUTINE W REFLEX MICROSCOPIC
Bilirubin Urine: NEGATIVE
Glucose, UA: NEGATIVE mg/dL
Hgb urine dipstick: NEGATIVE
KETONES UR: NEGATIVE mg/dL
LEUKOCYTES UA: NEGATIVE
NITRITE: NEGATIVE
PH: 7 (ref 5.0–8.0)
Protein, ur: NEGATIVE mg/dL
Specific Gravity, Urine: 1.004 — ABNORMAL LOW (ref 1.005–1.030)

## 2018-03-24 LAB — BRAIN NATRIURETIC PEPTIDE: B Natriuretic Peptide: 23 pg/mL (ref 0.0–100.0)

## 2018-03-24 LAB — I-STAT TROPONIN, ED: Troponin i, poc: 0 ng/mL (ref 0.00–0.08)

## 2018-03-24 MED ORDER — ACETAMINOPHEN 325 MG PO TABS: 325.0000 mg | ORAL_TABLET | ORAL | Status: DC | PRN

## 2018-03-24 MED ORDER — PAROXETINE HCL 10 MG PO TABS
30.0000 mg | ORAL_TABLET | Freq: Every day | ORAL | Status: DC
  Filled 2018-03-24 (×2): qty 3
  Filled 2018-03-24 (×2): qty 1

## 2018-03-24 MED ORDER — SODIUM CHLORIDE 0.9% FLUSH
3.0000 mL | Freq: Two times a day (BID) | INTRAVENOUS | Status: DC
  Administered 2018-03-25 (×2): 3 mL via INTRAVENOUS

## 2018-03-24 MED ORDER — FERROUS SULFATE 325 (65 FE) MG PO TABS
325.0000 mg | ORAL_TABLET | Freq: Two times a day (BID) | ORAL | Status: DC
  Administered 2018-03-25 (×2): 325 mg via ORAL
  Filled 2018-03-24 (×2): qty 1

## 2018-03-24 MED ORDER — VITAMIN D 1000 UNITS PO TABS
1000.0000 [IU] | ORAL_TABLET | Freq: Every day | ORAL | Status: DC
  Administered 2018-03-25: 1000 [IU] via ORAL
  Filled 2018-03-24: qty 1

## 2018-03-24 MED ORDER — ONDANSETRON HCL 4 MG/2ML IJ SOLN: 4.0000 mg | Freq: Four times a day (QID) | INTRAMUSCULAR | Status: DC | PRN

## 2018-03-24 MED ORDER — INSULIN ASPART 100 UNIT/ML ~~LOC~~ SOLN: 0.0000 [IU] | Freq: Three times a day (TID) | SUBCUTANEOUS | Status: DC

## 2018-03-24 MED ORDER — MONTELUKAST SODIUM 10 MG PO TABS
10.0000 mg | ORAL_TABLET | Freq: Every day | ORAL | Status: DC
  Administered 2018-03-25: 10 mg via ORAL
  Filled 2018-03-24: qty 1

## 2018-03-24 MED ORDER — VITAMIN B-12 100 MCG PO TABS
250.0000 ug | ORAL_TABLET | Freq: Every day | ORAL | Status: DC
  Administered 2018-03-25: 250 ug via ORAL
  Filled 2018-03-24: qty 3

## 2018-03-24 MED ORDER — SODIUM CHLORIDE 0.9% FLUSH: 3.0000 mL | INTRAVENOUS | Status: DC | PRN

## 2018-03-24 MED ORDER — CALCIUM CARBONATE 1250 (500 CA) MG PO TABS
1250.0000 mg | ORAL_TABLET | Freq: Every day | ORAL | Status: DC
  Administered 2018-03-25: 1250 mg via ORAL
  Filled 2018-03-24: qty 1

## 2018-03-24 MED ORDER — ALPRAZOLAM 0.5 MG PO TABS: 0.5000 mg | ORAL_TABLET | Freq: Three times a day (TID) | ORAL | Status: DC | PRN

## 2018-03-24 MED ORDER — SODIUM CHLORIDE 0.9 % IV SOLN: 250.0000 mL | INTRAVENOUS | Status: DC | PRN

## 2018-03-24 MED ORDER — ATORVASTATIN CALCIUM 40 MG PO TABS
40.0000 mg | ORAL_TABLET | Freq: Every day | ORAL | Status: DC
  Administered 2018-03-25: 40 mg via ORAL
  Filled 2018-03-24: qty 1

## 2018-03-24 MED ORDER — APIXABAN 5 MG PO TABS
5.0000 mg | ORAL_TABLET | Freq: Two times a day (BID) | ORAL | Status: DC
  Administered 2018-03-25 (×2): 5 mg via ORAL
  Filled 2018-03-24 (×2): qty 1

## 2018-03-24 MED ORDER — ALBUTEROL SULFATE (2.5 MG/3ML) 0.083% IN NEBU: 2.5000 mg | INHALATION_SOLUTION | Freq: Four times a day (QID) | RESPIRATORY_TRACT | Status: DC | PRN

## 2018-03-24 MED ORDER — NITROGLYCERIN 0.4 MG SL SUBL
0.4000 mg | SUBLINGUAL_TABLET | SUBLINGUAL | Status: DC | PRN
  Administered 2018-03-24: 0.4 mg via SUBLINGUAL
  Filled 2018-03-24: qty 1

## 2018-03-24 MED ORDER — LOSARTAN POTASSIUM 25 MG PO TABS
50.0000 mg | ORAL_TABLET | Freq: Every day | ORAL | Status: DC
  Administered 2018-03-25: 50 mg via ORAL
  Filled 2018-03-24: qty 2

## 2018-03-24 MED ORDER — ONDANSETRON HCL 4 MG PO TABS: 4.0000 mg | ORAL_TABLET | Freq: Four times a day (QID) | ORAL | Status: DC | PRN

## 2018-03-24 MED ORDER — ISOSORBIDE MONONITRATE ER 30 MG PO TB24
30.0000 mg | ORAL_TABLET | Freq: Every day | ORAL | Status: DC
  Administered 2018-03-25: 30 mg via ORAL
  Filled 2018-03-24: qty 1

## 2018-03-24 NOTE — ED Triage Notes (Signed)
Pt reports CP since this morning, progressively got worse, with SOB and lightheaded. Pt reports pain is now 2/10, when EMS arrived pt reports pain as 8/10. EMS gave 324 mg ASA and one nitro SL in route.

## 2018-03-24 NOTE — ED Provider Notes (Signed)
[facility] EMERGENCY DEPARTMENT Provider Note   CSN: [id number] Arrival date & time: 03/24/18  1956     History   Chief Complaint Chief Complaint  Patient presents with  . Chest Pain    HPI [name] [name] is a 67 y.o. female.  HPI  Pt was seen at 2020.  Per pt, c/o gradual onset and persistence of multiple intermittent episodes of chest "pain" that began this morning approximately 6am after wakening. Pt states she was performing her morning routine when she noted mid-sternal chest "squeezing," associated with SOB and nausea. Pt states her symptoms lasted for approximately 15 minutes before improving with rest. Pt states her symptoms occurred again whenever she exerted herself (ie: walked into church) and improved with resting. Pt states her symptoms "got really bad" this evening PTA when she was "walking up and down stairs doing laundry." Was additionally associated with vomiting and lightheadedness. EMS gave pt ASA and SL ntg with significant improvement of her symptoms. Pt states she also was having intermittent episodes of "racing heart and then my heart would beat really slow" throughout the day today. Denies cough, no back pain, no abd pain, no diarrhea, no black or blood in emesis, no rash, no fevers, no injury, no focal motor weakness.    Past Medical History:  Diagnosis Date  . Anemia    hx of years ago   . Anxiety   . Aortic valve disorders   . Arthritis   . Asthma   . Barrett esophagus   . Bipolar disorder ([place])   . CHF (congestive heart failure) ([place])   . Chronic bronchitis ([place])   . Chronic kidney disease    "I don't have but 1" (08/03/2016)  . COPD (chronic obstructive pulmonary disease) ([place])    "just have a touch" (08/03/2016)  . Depression   . Esophageal reflux   . Esophageal stricture   . Fibromyalgia    "hands, knees, elbows; back; think it's in my hips" (08/03/2016)  . GAD (generalized anxiety disorder)   . GERD (gastroesophageal reflux disease)   .  Hiatal hernia   . History of blood transfusion 1970s; 01/08/2013   "low blood count"  . History of kidney stones 01-08-13   past hx.  . History of nuclear stress test 01/2016   low risk study  . History of stomach ulcers    "some have bleed"  . Hyperlipemia   . Hypertension 12/07/2012  . Insomnia, unspecified   . Myocardial infarction [facility]) 01-08-13   '06-Chest pain-no stent-dx. MI-stress related  . Osteoporosis   . Personal history of colonic polyps 09/2010   TUBULAR ADENOMAS (X3); NEGATIVE FOR HIGH GRADE DYSPLASIA OR MALIGNANCY.  [name] Pneumonia    "several times" (08/03/2016)  . Stroke [facility])    "2 or 3"; remains with some right sided weaknes (08/03/2016)  . Takotsubo syndrome   . Tremor, essential 03/20/2017  . Type II diabetes mellitus ([place])   . Urinary frequency    AND INCONTINENCE    Patient Active Problem List   Diagnosis Date Noted  . Tremor, essential 03/20/2017  . Syncope and collapse 03/20/2017  . AF (atrial fibrillation) ([place]) 08/03/2016  . Hypokalemia 02/25/2016  . Anxiety 02/24/2016  . Stroke-like symptoms 02/24/2016  . Weakness 01/26/2016  . Atrial fibrillation ([place]) 01/26/2016  . History of colon polyps 01/28/2015  . Dysphagia 01/28/2015  . History of esophageal stricture 01/28/2015  . Primary osteoarthritis of right knee 06/24/2014  . Acute systolic heart failure ([place]) 06/23/2014  .  Takotsubo syndrome 06/21/2014  . Elevated troponin 06/20/2014  . Atrial fibrillation with RVR ([place]) 06/20/2014  . Congestive dilated cardiomyopathy ([place]) 06/20/2014  . Demand ischemia of myocardium ([place]) 06/20/2014  . 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 Respiratory failure requiring intubation ([place]) 06/19/2014  . Acute pulmonary edema ([place]) 06/19/2014  . Acute respiratory failure with hypoxia ([place]) 06/19/2014  . Hypoxemia   . Arterial hypotension   . Gait instability 10/26/2013  . Stroke ([place]) 10/26/2013  . TIA (transient ischemic attack) 10/26/2013  . Tobacco  user, hx of 05/20/2013  . Postoperative anemia due to acute blood loss 01/18/2013  . Osteoarthritis of left knee 01/17/2013  . Essential hypertension 12/07/2012  . Peripheral edema 12/07/2012  . Diabetes mellitus with complication ([place]) 16/05/9603  . COPD mixed type ([place]) 08/05/2012  . Seasonal and perennial allergic rhinitis 08/05/2012  . Urticaria 08/05/2012  . Chest pain 02/06/2012  . Hyperlipidemia 02/06/2012    Past Surgical History:  Procedure Laterality Date  . ATRIAL FIBRILLATION ABLATION  08/03/2016  . CARDIAC CATHETERIZATION     normal per Dr Acie Fredrickson in note dated 02/06/12   . CARDIAC CATHETERIZATION     "I've had 2 or 3" (08/03/2016)  . CARPAL TUNNEL RELEASE Right   . CATARACT EXTRACTION W/ INTRAOCULAR LENS  IMPLANT, BILATERAL    . COLONOSCOPY W/ BIOPSIES AND POLYPECTOMY    . DILATION AND CURETTAGE OF UTERUS     S/P miscarriage  . ELECTROPHYSIOLOGIC STUDY N/A 08/03/2016   Procedure: Atrial Fibrillation Ablation;  Surgeon: Will Meredith Leeds, MD;  Location: [facility];  Service: Cardiovascular;  Laterality: N/A;  . ESOPHAGEAL DILATION  01-08-13   2 yrs ago  . HEEL SPUR SURGERY Left 2005  . KNEE ARTHROSCOPY  02/28/2012   Procedure: ARTHROSCOPY KNEE;  Surgeon: Tobi Bastos, MD;  Location: [facility];  Service: Orthopedics;  Laterality: Left;  . LAPAROSCOPIC CHOLECYSTECTOMY  03/2002  . SHOULDER OPEN ROTATOR CUFF REPAIR Right 2011  . TONSILLECTOMY AND ADENOIDECTOMY  1965  . TOTAL KNEE ARTHROPLASTY Left 01/17/2013   Procedure: LEFT TOTAL KNEE ARTHROPLASTY;  Surgeon: Tobi Bastos, MD;  Location: [facility];  Service: Orthopedics;  Laterality: Left;  . TOTAL KNEE ARTHROPLASTY Right 06/19/2014   Procedure: RIGHT TOTAL KNEE ARTHROPLASTY;  Surgeon: Tobi Bastos, MD;  Location: [facility];  Service: Orthopedics;  Laterality: Right;  . TUBAL LIGATION    . VAGINAL HYSTERECTOMY     "partial"  . WRIST FLEXION TENDON TENOTOMIES AND PROXIMAL CORPECTOMY W/ WRIST ARTHRODESIS&ILIAC  CREST BONE GRAFT       OB History   None      Home Medications    Prior to Admission medications   Medication Sig Start Date End Date Taking? Authorizing Provider  acetaminophen (TYLENOL) 325 MG tablet Take 1-2 tablets (325-650 mg total) by mouth every 4 (four) hours as needed for mild pain. 06/27/14  Yes Love, Ivan Anchors, PA-C  albuterol (PROVENTIL) (2.5 MG/3ML) 0.083% nebulizer solution Take 3 mLs (2.5 mg total) by nebulization every 6 (six) hours as needed for wheezing. DX  491.9 01/08/15  Yes Young, Tarri Fuller D, MD  ALPRAZolam Duanne Moron) 0.5 MG tablet Take 0.5 mg by mouth 3 (three) times daily as needed for anxiety or sleep.    Yes [provider]  apixaban (ELIQUIS) 5 MG TABS tablet Take 1 tablet (5 mg total) by mouth 2 (two) times daily. 03/12/18  Yes Camnitz, Will Hassell Done, MD  atorvastatin (LIPITOR) 40 MG tablet Take 1  tablet (40 mg total) by mouth at bedtime. 07/09/13  Yes Martin, Mary-Margaret, FNP  azelastine (ASTELIN) 0.1 % nasal spray Place 1 spray into both nostrils 2 (two) times daily. Use in each nostril as directed   Yes [provider]  benzonatate (TESSALON) 200 MG capsule Take 200 mg by mouth 3 (three) times daily as needed for cough.   Yes [provider]  bismuth subsalicylate (PEPTO BISMOL) 262 MG/15ML suspension Take 30 mLs by mouth every 6 (six) hours as needed for diarrhea or loose stools.   Yes [provider]  calcium carbonate (OS-CAL) 600 MG TABS Take 600 mg by mouth at bedtime.    Yes [provider]  cholecalciferol (VITAMIN D) 1000 UNITS tablet Take 1,000 Units by mouth at bedtime.    Yes [provider]  EPIPEN 2-PAK 0.3 MG/0.3ML SOAJ injection USE AS DIRECTED 03/15/16  Yes Young, Clinton D, MD  ferrous sulfate 325 (65 FE) MG tablet Take 1 tablet (325 mg total) by mouth 2 (two) times daily. 06/27/14  Yes Love, Ivan Anchors, PA-C  furosemide (LASIX) 20 MG tablet Take 1 tablet (20 mg total) by mouth daily. 03/12/18  Yes  Camnitz, Will Hassell Done, MD  isosorbide mononitrate (IMDUR) 30 MG 24 hr tablet Take 1 tablet (30 mg total) by mouth daily. 03/12/18  Yes Camnitz, Will Hassell Done, MD  losartan (COZAAR) 50 MG tablet Take 1 tablet (50 mg total) by mouth daily. 03/12/18  Yes Camnitz, Will Hassell Done, MD  montelukast (SINGULAIR) 10 MG tablet Take 1 tablet (10 mg total) by mouth at bedtime. 05/15/14  Yes Young, Tarri Fuller D, MD  PARoxetine (PAXIL) 30 MG tablet Take 30 mg by mouth daily. 01/11/18  Yes [provider]  PROAIR HFA 108 (90 Base) MCG/ACT inhaler INHALE TWO PUFFS INTO THE LUNGS EVERY 6 HOURS AS NEEDED FOR WHEEZING OR SHORTNESS OF BREATH Patient taking differently: Inhale 2 puffs into the lungs every 6 (six) hours as needed for wheezing or shortness of breath.  12/14/17  Yes Young, Tarri Fuller D, MD  SPIRIVA HANDIHALER 18 MCG inhalation capsule PLACE ONE CAPSULE IN INHALER AND INHALE DAILY Patient taking differently: Place 18 mcg into inhaler and inhale daily.  02/19/18  Yes Young, Tarri Fuller D, MD  vitamin B-12 (CYANOCOBALAMIN) 250 MCG tablet Take 250 mcg by mouth at bedtime.    Yes [provider]    Family History Family History  Problem Relation Age of Onset  . Breast cancer Mother   . Stroke Mother   . Transient ischemic attack Mother   . Colon cancer Brother 55  . Colon polyps Brother   . Heart failure Brother   . Heart attack Father   . Heart failure Father   . Diabetes Maternal Grandfather   . Kidney disease Unknown        Both sides of family  . Heart disease Unknown        Both sides of family  . Breast cancer Maternal Aunt   . Breast cancer Maternal Grandmother     Social History Social History   Tobacco Use  . Smoking status: Former Smoker    Packs/day: 0.50    Years: 50.00    Pack years: 25.00    Types: Cigarettes    Last attempt to quit: 01/08/2016    Years since quitting: 2.2  . Smokeless tobacco: Former User    Types: Snuff, Chew    Quit date: 12/31/2011  Substance Use Topics    . Alcohol use: Yes  Comment: 08/03/2016 "maybe 1 beer/month; a shot of rum" maybe twice/year"  . Drug use: No     Allergies   Bee venom; Ceclor [cefaclor]; Penicillins; Pneumococcal vaccines; Codeine; Cortisone; and Boniva [ibandronate sodium]   Review of Systems Review of Systems ROS: Statement: All systems negative except as marked or noted in the HPI; Constitutional: Negative for fever and chills. ; ; Eyes: Negative for eye pain, redness and discharge. ; ; ENMT: Negative for ear pain, hoarseness, nasal congestion, sinus pressure and sore throat. ; ; Cardiovascular: +CP, SOB. Negative for palpitations, diaphoresis, and peripheral edema. ; ; Respiratory: Negative for cough, wheezing and stridor. ; ; Gastrointestinal: +N/V. Negative for diarrhea, abdominal pain, blood in stool, hematemesis, jaundice and rectal bleeding. . ; ; Genitourinary: Negative for dysuria, flank pain and hematuria. ; ; Musculoskeletal: Negative for back pain and neck pain. Negative for swelling and trauma.; ; Skin: Negative for pruritus, rash, abrasions, blisters, bruising and skin lesion.; ; Neuro: +lightheadedness. Negative for headache and neck stiffness. Negative for weakness, altered level of consciousness, altered mental status, extremity weakness, paresthesias, involuntary movement, seizure and syncope.       Physical Exam Updated Vital Signs BP 126/73   Pulse (!) 46   Temp 98 F (36.7 C)   Resp 17   Ht 5' (1.524 m)   Wt 80.5 kg   SpO2 95%   BMI 34.64 kg/m     Patient Vitals for the past 24 hrs:  BP Temp Pulse Resp SpO2 Height Weight  03/24/18 2200 121/69 - - 18 - - -  03/24/18 2130 137/67 - - 15 - - -  03/24/18 2100 126/73 - (!) 46 17 95 % - -  03/24/18 2030 (!) 155/79 - 62 18 96 % - -  03/24/18 2007 (!) 154/66 98 F (36.7 C) (!) 52 19 100 % - -  03/24/18 2003 - - - - - 5' (1.524 m) 80.5 kg     Physical Exam 2025: Physical examination:  Nursing notes reviewed; Vital signs and O2 SAT  reviewed;  Constitutional: Well developed, Well nourished, Well hydrated, In no acute distress; Head:  Normocephalic, atraumatic; Eyes: EOMI, PERRL, No scleral icterus; ENMT: Mouth and pharynx normal, Mucous membranes moist; Neck: Supple, Full range of motion, No lymphadenopathy; Cardiovascular: Regular rate and rhythm, No gallop; Respiratory: Breath sounds clear & equal bilaterally, No wheezes.  Speaking full sentences with ease, Normal respiratory effort/excursion; Chest: Nontender, Movement normal; Abdomen: Soft, Nontender, Nondistended, Normal bowel sounds; Genitourinary: No CVA tenderness; Extremities: Peripheral pulses normal, No tenderness, No edema, No calf edema or asymmetry.; Neuro: AA&Ox3, Major CN grossly intact.  Speech clear. No gross focal motor or sensory deficits in extremities.; Skin: Color normal, Warm, Dry.    ED Treatments / Results  Labs (all labs ordered are listed, but only abnormal results are displayed)   EKG EKG Interpretation  Date/Time:  Saturday March 24 2018 20:12:21 EDT Ventricular Rate:  55 PR Interval:    QRS Duration: 102 QT Interval:  467 QTC Calculation: 447 R Axis:   -9 Text Interpretation:  Sinus rhythm Atrial premature complexes Low voltage, precordial leads Anteroseptal infarct, old Borderline T wave abnormalities Baseline wander When compared with ECG of 09/05/2016 No significant change was found Confirmed by Francine Graven [phone]) on 03/24/2018 8:45:25 PM   Radiology   Procedures Procedures (including critical care time)  Medications Ordered in ED Medications  nitroGLYCERIN (NITROSTAT) SL tablet 0.4 mg (0.4 mg Sublingual Given 03/24/18 2056)     Initial  Impression / Assessment and Plan / ED Course  I have reviewed the triage vital signs and the nursing notes.  Pertinent labs & imaging results that were available during my care of the patient were reviewed by me and considered in my medical decision making (see chart for  details).  MDM Reviewed: previous chart, nursing note and vitals Reviewed previous: labs and ECG Interpretation: labs, ECG and x-ray   Results for orders placed or performed during the hospital encounter of 41/93/79  Basic metabolic panel  Result Value Ref Range   Sodium 142 135 - 145 mmol/L   Potassium 3.7 3.5 - 5.1 mmol/L   Chloride 106 98 - 111 mmol/L   CO2 26 22 - 32 mmol/L   Glucose, Bld 139 (H) 70 - 99 mg/dL   BUN 8 8 - 23 mg/dL   Creatinine, Ser 0.78 0.44 - 1.00 mg/dL   Calcium 9.2 8.9 - 10.3 mg/dL   GFR calc non Af Amer >60 >60 mL/min   GFR calc Af Amer >60 >60 mL/min   Anion gap 10 5 - 15  Brain natriuretic peptide  Result Value Ref Range   B Natriuretic Peptide 23.0 0.0 - 100.0 pg/mL  Troponin I  Result Value Ref Range   Troponin I <0.03 <0.03 ng/mL  CBC with Differential  Result Value Ref Range   WBC 6.3 4.0 - 10.5 K/uL   RBC 3.99 3.87 - 5.11 MIL/uL   Hemoglobin 12.6 12.0 - 15.0 g/dL   HCT 37.3 36.0 - 46.0 %   MCV 93.5 78.0 - 100.0 fL   MCH 31.6 26.0 - 34.0 pg   MCHC 33.8 30.0 - 36.0 g/dL   RDW 13.5 11.5 - 15.5 %   Platelets 184 150 - 400 K/uL   Neutrophils Relative % 60 %   Neutro Abs 3.8 1.7 - 7.7 K/uL   Lymphocytes Relative 30 %   Lymphs Abs 1.9 0.7 - 4.0 K/uL   Monocytes Relative 9 %   Monocytes Absolute 0.6 0.1 - 1.0 K/uL   Eosinophils Relative 1 %   Eosinophils Absolute 0.1 0.0 - 0.7 K/uL   Basophils Relative 0 %   Basophils Absolute 0.0 0.0 - 0.1 K/uL  Protime-INR  Result Value Ref Range   Prothrombin Time 14.3 11.4 - 15.2 seconds   INR 1.12   Urinalysis, Routine w reflex microscopic  Result Value Ref Range   Color, Urine STRAW (A) YELLOW   APPearance CLEAR CLEAR   Specific Gravity, Urine 1.004 (L) 1.005 - 1.030   pH 7.0 5.0 - 8.0   Glucose, UA NEGATIVE NEGATIVE mg/dL   Hgb urine dipstick NEGATIVE NEGATIVE   Bilirubin Urine NEGATIVE NEGATIVE   Ketones, ur NEGATIVE NEGATIVE mg/dL   Protein, ur NEGATIVE NEGATIVE mg/dL   Nitrite NEGATIVE  NEGATIVE   Leukocytes, UA NEGATIVE NEGATIVE  I-stat Chem 8, ED  Result Value Ref Range   Sodium 141 135 - 145 mmol/L   Potassium 3.9 3.5 - 5.1 mmol/L   Chloride 103 98 - 111 mmol/L   BUN 8 8 - 23 mg/dL   Creatinine, Ser 0.70 0.44 - 1.00 mg/dL   Glucose, Bld 134 (H) 70 - 99 mg/dL   Calcium, Ion 1.11 (L) 1.15 - 1.40 mmol/L   TCO2 27 22 - 32 mmol/L   Hemoglobin 12.2 12.0 - 15.0 g/dL   HCT 36.0 36.0 - 46.0 %  I-stat troponin, ED  Result Value Ref Range   Troponin i, poc 0.00 0.00 - 0.08 ng/mL  Comment 3           Dg Chest Port 1 View Result Date: 03/24/2018 CLINICAL DATA:  Central chest pain, worsening shortness of breath. EXAM: PORTABLE CHEST 1 VIEW COMPARISON:  Chest x-rays dated 11/07/2016 and 02/24/2016. FINDINGS: Heart size and mediastinal contours are within normal limits. Chronic interstitial prominence appears stable. No new confluent opacity to suggest a developing pneumonia. No pleural effusion or pneumothorax seen. Osseous structures about the chest are unremarkable. IMPRESSION: 1. No active disease. No evidence of pneumonia or pulmonary edema. 2. Chronic interstitial prominence, suggesting some degree of chronic interstitial lung disease and/or chronic bronchitic change. Electronically Signed   By: Franki Cabot M.D.   On: 03/24/2018 20:54    2210:  Troponin negative. EKG unchanged from previous. CP improved after 2nd SL ntg. Dx and testing d/w pt.  Questions answered.  Verb understanding, agreeable to admit. T/C returned from [facility] Dr. Odis Hollingshead, case discussed, including:  HPI, pertinent PM/SHx, VS/PE, dx testing, ED course and treatment:  Pt can stay at [facility] for observation admit to cycle troponins, no need to start heparin unless troponin elevates or symptoms change.  2230:  T/C returned from [facility] Dr. Darrick Meigs, case discussed, including:  HPI, pertinent PM/SHx, VS/PE, dx testing, ED course and treatment:  Agreeable to admit.     Final Clinical Impressions(s) / ED Diagnoses    Final diagnoses:  None    ED Discharge Orders    None       Francine Graven, DO 03/28/18 2352

## 2018-03-24 NOTE — H&P (Signed)
TRH H&P    Patient Demographics:    [name] [name], is a 67 y.o. female  MRN: [id number]  DOB - 1950/12/04  Admit Date - 03/24/2018  Referring MD/NP/PA: Dr. Thurnell Garbe  Outpatient Primary MD for the patient is Velna Hatchet, MD  Patient coming from: Home  Chief complaint-chest pain   HPI:    [name] [name]  is a 67 y.o. female, with history of paroxysmal atrial fibrillation, on Eliquis, status post ablation on 08/03/16, hypertension, Takotsubo cardiomyopathy, diabetes mellitus who came to hospital with complaints of chest pain.  Patient says chest pain started this morning it was associated with shortness of breath and one episode of vomiting at home.  She denies nausea, no diarrhea.  Pain improved after patient was given aspirin and nitroglycerin. She denies abdominal pain.  Denies fever. Complains of chills. Denies dysuria, denies coughing up any phlegm.    Review of systems:      All other systems reviewed and are negative.   With Past History of the following :    Past Medical History:  Diagnosis Date  . Anemia    hx of years ago   . Anxiety   . Aortic valve disorders   . Arthritis   . Asthma   . Barrett esophagus   . Bipolar disorder ([place])   . CHF (congestive heart failure) ([place])   . Chronic bronchitis ([place])   . Chronic kidney disease    "I don't have but 1" (08/03/2016)  . COPD (chronic obstructive pulmonary disease) ([place])    "just have a touch" (08/03/2016)  . Depression   . Esophageal reflux   . Esophageal stricture   . Fibromyalgia    "hands, knees, elbows; back; think it's in my hips" (08/03/2016)  . GAD (generalized anxiety disorder)   . GERD (gastroesophageal reflux disease)   . Hiatal hernia   . History of blood transfusion 1970s; 01/08/2013   "low blood count"  . History of kidney stones 01-08-13   past hx.  . History of nuclear stress test 01/2016   low risk study  .  History of stomach ulcers    "some have bleed"  . Hyperlipemia   . Hypertension 12/07/2012  . Insomnia, unspecified   . Myocardial infarction [facility]) 01-08-13   '06-Chest pain-no stent-dx. MI-stress related  . Osteoporosis   . Personal history of colonic polyps 09/2010   TUBULAR ADENOMAS (X3); NEGATIVE FOR HIGH GRADE DYSPLASIA OR MALIGNANCY.  [name] Pneumonia    "several times" (08/03/2016)  . Stroke [facility])    "2 or 3"; remains with some right sided weaknes (08/03/2016)  . Takotsubo syndrome   . Tremor, essential 03/20/2017  . Type II diabetes mellitus ([place])   . Urinary frequency    AND INCONTINENCE      Past Surgical History:  Procedure Laterality Date  . ATRIAL FIBRILLATION ABLATION  08/03/2016  . CARDIAC CATHETERIZATION     normal per Dr Acie Fredrickson in note dated 02/06/12   . CARDIAC CATHETERIZATION     "I've had 2 or 3" (08/03/2016)  .  CARPAL TUNNEL RELEASE Right   . CATARACT EXTRACTION W/ INTRAOCULAR LENS  IMPLANT, BILATERAL    . COLONOSCOPY W/ BIOPSIES AND POLYPECTOMY    . DILATION AND CURETTAGE OF UTERUS     S/P miscarriage  . ELECTROPHYSIOLOGIC STUDY N/A 08/03/2016   Procedure: Atrial Fibrillation Ablation;  Surgeon: Will Meredith Leeds, MD;  Location: [facility];  Service: Cardiovascular;  Laterality: N/A;  . ESOPHAGEAL DILATION  01-08-13   2 yrs ago  . HEEL SPUR SURGERY Left 2005  . KNEE ARTHROSCOPY  02/28/2012   Procedure: ARTHROSCOPY KNEE;  Surgeon: Tobi Bastos, MD;  Location: [facility];  Service: Orthopedics;  Laterality: Left;  . LAPAROSCOPIC CHOLECYSTECTOMY  03/2002  . SHOULDER OPEN ROTATOR CUFF REPAIR Right 2011  . TONSILLECTOMY AND ADENOIDECTOMY  1965  . TOTAL KNEE ARTHROPLASTY Left 01/17/2013   Procedure: LEFT TOTAL KNEE ARTHROPLASTY;  Surgeon: Tobi Bastos, MD;  Location: [facility];  Service: Orthopedics;  Laterality: Left;  . TOTAL KNEE ARTHROPLASTY Right 06/19/2014   Procedure: RIGHT TOTAL KNEE ARTHROPLASTY;  Surgeon: Tobi Bastos, MD;  Location: [facility];  Service:  Orthopedics;  Laterality: Right;  . TUBAL LIGATION    . VAGINAL HYSTERECTOMY     "partial"  . WRIST FLEXION TENDON TENOTOMIES AND PROXIMAL CORPECTOMY W/ WRIST ARTHRODESIS&ILIAC CREST BONE GRAFT        Social History:      Social History   Tobacco Use  . Smoking status: Former Smoker    Packs/day: 0.50    Years: 50.00    Pack years: 25.00    Types: Cigarettes    Last attempt to quit: 01/08/2016    Years since quitting: 2.2  . Smokeless tobacco: Former User    Types: Snuff, Chew    Quit date: 12/31/2011  Substance Use Topics  . Alcohol use: Yes    Comment: 08/03/2016 "maybe 1 beer/month; a shot of rum" maybe twice/year"       Family History :     Family History  Problem Relation Age of Onset  . Breast cancer Mother   . Stroke Mother   . Transient ischemic attack Mother   . Colon cancer Brother 56  . Colon polyps Brother   . Heart failure Brother   . Heart attack Father   . Heart failure Father   . Diabetes Maternal Grandfather   . Kidney disease Unknown        Both sides of family  . Heart disease Unknown        Both sides of family  . Breast cancer Maternal Aunt   . Breast cancer Maternal Grandmother       Home Medications:   Prior to Admission medications   Medication Sig Start Date End Date Taking? Authorizing Provider  acetaminophen (TYLENOL) 325 MG tablet Take 1-2 tablets (325-650 mg total) by mouth every 4 (four) hours as needed for mild pain. 06/27/14  Yes Love, Ivan Anchors, PA-C  albuterol (PROVENTIL) (2.5 MG/3ML) 0.083% nebulizer solution Take 3 mLs (2.5 mg total) by nebulization every 6 (six) hours as needed for wheezing. DX  491.9 01/08/15  Yes Young, Tarri Fuller D, MD  ALPRAZolam Duanne Moron) 0.5 MG tablet Take 0.5 mg by mouth 3 (three) times daily as needed for anxiety or sleep.    Yes [provider]  apixaban (ELIQUIS) 5 MG TABS tablet Take 1 tablet (5 mg total) by mouth 2 (two) times daily. 03/12/18  Yes Camnitz, Will Hassell Done, MD  atorvastatin (LIPITOR)  40 MG tablet  Take 1 tablet (40 mg total) by mouth at bedtime. 07/09/13  Yes Martin, Mary-Margaret, FNP  azelastine (ASTELIN) 0.1 % nasal spray Place 1 spray into both nostrils 2 (two) times daily. Use in each nostril as directed   Yes [provider]  benzonatate (TESSALON) 200 MG capsule Take 200 mg by mouth 3 (three) times daily as needed for cough.   Yes [provider]  bismuth subsalicylate (PEPTO BISMOL) 262 MG/15ML suspension Take 30 mLs by mouth every 6 (six) hours as needed for diarrhea or loose stools.   Yes [provider]  calcium carbonate (OS-CAL) 600 MG TABS Take 600 mg by mouth at bedtime.    Yes [provider]  cholecalciferol (VITAMIN D) 1000 UNITS tablet Take 1,000 Units by mouth at bedtime.    Yes [provider]  EPIPEN 2-PAK 0.3 MG/0.3ML SOAJ injection USE AS DIRECTED 03/15/16  Yes Young, Clinton D, MD  ferrous sulfate 325 (65 FE) MG tablet Take 1 tablet (325 mg total) by mouth 2 (two) times daily. 06/27/14  Yes Love, Ivan Anchors, PA-C  furosemide (LASIX) 20 MG tablet Take 1 tablet (20 mg total) by mouth daily. 03/12/18  Yes Camnitz, Will Hassell Done, MD  isosorbide mononitrate (IMDUR) 30 MG 24 hr tablet Take 1 tablet (30 mg total) by mouth daily. 03/12/18  Yes Camnitz, Will Hassell Done, MD  losartan (COZAAR) 50 MG tablet Take 1 tablet (50 mg total) by mouth daily. 03/12/18  Yes Camnitz, Will Hassell Done, MD  montelukast (SINGULAIR) 10 MG tablet Take 1 tablet (10 mg total) by mouth at bedtime. 05/15/14  Yes Young, Tarri Fuller D, MD  PARoxetine (PAXIL) 30 MG tablet Take 30 mg by mouth daily. 01/11/18  Yes [provider]  PROAIR HFA 108 (90 Base) MCG/ACT inhaler INHALE TWO PUFFS INTO THE LUNGS EVERY 6 HOURS AS NEEDED FOR WHEEZING OR SHORTNESS OF BREATH Patient taking differently: Inhale 2 puffs into the lungs every 6 (six) hours as needed for wheezing or shortness of breath.  12/14/17  Yes Young, Tarri Fuller D, MD  SPIRIVA HANDIHALER 18 MCG inhalation capsule  PLACE ONE CAPSULE IN INHALER AND INHALE DAILY Patient taking differently: Place 18 mcg into inhaler and inhale daily.  02/19/18  Yes Young, Tarri Fuller D, MD  vitamin B-12 (CYANOCOBALAMIN) 250 MCG tablet Take 250 mcg by mouth at bedtime.    Yes [provider]     Allergies:     Allergies  Allergen Reactions  . Bee Venom Anaphylaxis  . Ceclor [Cefaclor] Other (See Comments)    Reaction=burning all over  . Penicillins Anaphylaxis    "CLOSES OFF MY BREATHING" Has patient had a PCN reaction causing immediate rash, facial/tongue/throat swelling, SOB or lightheadedness with hypotension: Yes Has patient had a PCN reaction causing severe rash involving mucus membranes or skin necrosis: No Has patient had a PCN reaction that required hospitalization No Has patient had a PCN reaction occurring within the last 10 years: No If all of the above answers are "NO", then may proceed with Cephalosporin use.   . Pneumococcal Vaccines Other (See Comments)    PP-23 vaccine; had BIG local red reaction with heat.   . Codeine Nausea And Vomiting  . Cortisone Hives    All over body  . Boniva [Ibandronate Sodium] Other (See Comments)    Jaw popping     Physical Exam:   Vitals  Blood pressure (!) 141/61, pulse (!) 45, temperature 98 F (36.7 C), resp. rate 14, height 5' (1.524 m), weight 80.5 kg,  SpO2 100 %.  1.  General: Appears in no acute distress  2. Psychiatric:  Intact judgement and  insight, awake alert, oriented x 3.  3. Neurologic: No focal neurological deficits, all cranial nerves intact.Strength 5/5 all 4 extremities, sensation intact all 4 extremities, plantars down going.  4. Eyes :  anicteric sclerae, moist conjunctivae with no lid lag. PERRLA.  5. ENMT:  Oropharynx clear with moist mucous membranes and good dentition  6. Neck:  supple, no cervical lymphadenopathy appriciated, No thyromegaly  7. Respiratory : Normal respiratory effort, good air movement  bilaterally,clear to  auscultation bilaterally  8. Cardiovascular : RRR, no gallops, rubs or murmurs, no leg edema  9. Gastrointestinal:  Positive bowel sounds, abdomen soft, non-tender to palpation,no hepatosplenomegaly, no rigidity or guarding       10. Skin:  No cyanosis, normal texture and turgor, no rash, lesions or ulcers  11.Musculoskeletal:  Patient has positive musculoskeletal component, tenderness to palpation at lower one third of sternum    Data Review:    CBC Recent Labs  Lab 03/24/18 2145 03/24/18 2149  WBC 6.3  --   HGB 12.6 12.2  HCT 37.3 36.0  PLT 184  --   MCV 93.5  --   MCH 31.6  --   MCHC 33.8  --   RDW 13.5  --   LYMPHSABS 1.9  --   MONOABS 0.6  --   EOSABS 0.1  --   BASOSABS 0.0  --    ------------------------------------------------------------------------------------------------------------------  Chemistries  Recent Labs  Lab 03/24/18 2145 03/24/18 2149  NA 142 141  K 3.7 3.9  CL 106 103  CO2 26  --   GLUCOSE 139* 134*  BUN 8 8  CREATININE 0.78 0.70  CALCIUM 9.2  --    ------------------------------------------------------------------------------------------------------------------  ------------------------------------------------------------------------------------------------------------------ GFR: Estimated Creatinine Clearance: 65.9 mL/min (by C-G formula based on SCr of 0.7 mg/dL). Liver Function Tests: No results for input(s): AST, ALT, ALKPHOS, BILITOT, PROT, ALBUMIN in the last 168 hours. No results for input(s): LIPASE, AMYLASE in the last 168 hours. No results for input(s): AMMONIA in the last 168 hours. Coagulation Profile: Recent Labs  Lab 03/24/18 2145  INR 1.12   Cardiac Enzymes: Recent Labs  Lab 03/24/18 2145  TROPONINI <0.03   BNP (last 3 results) No results for input(s): PROBNP in the last 8760 hours. HbA1C: No results for input(s): HGBA1C in the last 72 hours. CBG: No results for input(s): GLUCAP  in the last 168 hours. Lipid Profile: No results for input(s): CHOL, HDL, LDLCALC, TRIG, CHOLHDL, LDLDIRECT in the last 72 hours. Thyroid Function Tests: No results for input(s): TSH, T4TOTAL, FREET4, T3FREE, THYROIDAB in the last 72 hours. Anemia Panel: No results for input(s): VITAMINB12, FOLATE, FERRITIN, TIBC, IRON, RETICCTPCT in the last 72 hours.  --------------------------------------------------------------------------------------------------------------- Urine analysis:    Component Value Date/Time   COLORURINE STRAW (A) 03/24/2018 2100   APPEARANCEUR CLEAR 03/24/2018 2100   LABSPEC 1.004 (L) 03/24/2018 2100   PHURINE 7.0 03/24/2018 2100   GLUCOSEU NEGATIVE 03/24/2018 2100   HGBUR NEGATIVE 03/24/2018 2100   BILIRUBINUR NEGATIVE 03/24/2018 2100   KETONESUR NEGATIVE 03/24/2018 2100   PROTEINUR NEGATIVE 03/24/2018 2100   UROBILINOGEN 0.2 06/11/2014 1411   NITRITE NEGATIVE 03/24/2018 2100   LEUKOCYTESUR NEGATIVE 03/24/2018 2100      Imaging Results:    Dg Chest Port 1 View  Result Date: 03/24/2018 CLINICAL DATA:  Central chest pain, worsening shortness of breath. EXAM: PORTABLE CHEST 1 VIEW COMPARISON:  Chest x-rays  dated 11/07/2016 and 02/24/2016. FINDINGS: Heart size and mediastinal contours are within normal limits. Chronic interstitial prominence appears stable. No new confluent opacity to suggest a developing pneumonia. No pleural effusion or pneumothorax seen. Osseous structures about the chest are unremarkable. IMPRESSION: 1. No active disease. No evidence of pneumonia or pulmonary edema. 2. Chronic interstitial prominence, suggesting some degree of chronic interstitial lung disease and/or chronic bronchitic change. Electronically Signed   By: Franki Cabot M.D.   On: 03/24/2018 20:54    My personal review of EKG: Rhythm NSR, nonspecific ST changes   Assessment & Plan:    Active Problems:   Chest pain   1. Chest pain-placed under observation, obtain serial  cardiac enzymes.  Chest pain has resolved at this time.  Monitor on telemetry. 2. Paroxysmal atrial fibrillation-heart rate is controlled, continue anticoagulation with apixaban. 3. Takotsubo cardiomyopathy-continue Cozaar, will hold Lasix at this time.  Recent echo on 03/16/2018 showed EF 55 to 37%, grade 2 diastolic dysfunction. 4. Diabetes mellitus-start sliding scale insulin with NovoLog. 5. Hypertension-blood pressure stable, continue Cozaar.   DVT Prophylaxis-   patient is on Eliquis  AM Labs Ordered, also please review Full Orders  Family Communication: Admission, patients condition and plan of care including tests being ordered have been discussed with the patient and her family at bedside who indicate understanding and agree with the plan and Code Status.  Code Status: Full code  Admission status: Observation  Time spent in minutes : 60 minutes   Oswald Hillock M.D on 03/24/2018 at 11:15 PM  Between 7am to 7pm - Pager - [phone]. After 7pm go to [email or web address] - password [facility]  [facility] - Office  [phone]

## 2018-03-25 DIAGNOSIS — R079 Chest pain, unspecified: Secondary | ICD-10-CM | POA: Diagnosis not present

## 2018-03-25 DIAGNOSIS — R0789 Other chest pain: Secondary | ICD-10-CM | POA: Diagnosis not present

## 2018-03-25 LAB — COMPREHENSIVE METABOLIC PANEL
ALK PHOS: 73 U/L (ref 38–126)
ALT: 21 U/L (ref 0–44)
ANION GAP: 9 (ref 5–15)
AST: 20 U/L (ref 15–41)
Albumin: 4 g/dL (ref 3.5–5.0)
BILIRUBIN TOTAL: 0.4 mg/dL (ref 0.3–1.2)
BUN: 10 mg/dL (ref 8–23)
CALCIUM: 9.2 mg/dL (ref 8.9–10.3)
CO2: 27 mmol/L (ref 22–32)
CREATININE: 0.72 mg/dL (ref 0.44–1.00)
Chloride: 105 mmol/L (ref 98–111)
GFR calc non Af Amer: >60 mL/min (ref >60)
GLUCOSE: 162 mg/dL — AB (ref 70–99)
Potassium: 3.6 mmol/L (ref 3.5–5.1)
Sodium: 141 mmol/L (ref 135–145)
TOTAL PROTEIN: 6.9 g/dL (ref 6.5–8.1)

## 2018-03-25 LAB — CBC
HEMATOCRIT: 37.4 % (ref 36.0–46.0)
Hemoglobin: 12.3 g/dL (ref 12.0–15.0)
MCH: 31 pg (ref 26.0–34.0)
MCHC: 32.9 g/dL (ref 30.0–36.0)
MCV: 94.2 fL (ref 78.0–100.0)
Platelets: 184 10*3/uL (ref 150–400)
RBC: 3.97 MIL/uL (ref 3.87–5.11)
RDW: 13.8 % (ref 11.5–15.5)
WBC: 5.4 10*3/uL (ref 4.0–10.5)

## 2018-03-25 LAB — GLUCOSE, CAPILLARY
Glucose-Capillary: 116 mg/dL — ABNORMAL HIGH (ref 70–99)
Glucose-Capillary: 149 mg/dL — ABNORMAL HIGH (ref 70–99)
Glucose-Capillary: 170 mg/dL — ABNORMAL HIGH (ref 70–99)

## 2018-03-25 LAB — HEMOGLOBIN A1C
Hgb A1c MFr Bld: 7 % — ABNORMAL HIGH (ref 4.8–5.6)
Mean Plasma Glucose: 154.2 mg/dL

## 2018-03-25 LAB — TROPONIN I: Troponin I: <0.03 ng/mL (ref <0.03)

## 2018-03-25 MED ORDER — PAROXETINE HCL 10 MG PO TABS
30.0000 mg | ORAL_TABLET | Freq: Every day | ORAL | Status: DC
  Administered 2018-03-25: 30 mg via ORAL

## 2018-03-25 NOTE — Discharge Summary (Signed)
Physician Discharge Summary  [name] [name] QQP:[id number] DOB: [birth year] DOA: 03/24/2018  PCP: Velna Hatchet, MD  Admit date: 03/24/2018 Discharge date: 03/25/2018  Time spent: 45 minutes  Recommendations for Outpatient Follow-up:  -To be discharged home today. -Advise follow-up with PCP in 2 weeks.  Discharge Diagnoses:  Active Problems:   Chest pain   Discharge Condition: Stable and improved  Filed Weights   03/24/18 2003  Weight: 80.5 kg    History of present illness:  As per Dr. Darrick Meigs on 8/24: [name] [name]  is a 67 y.o. female, with history of paroxysmal atrial fibrillation, on Eliquis, status post ablation on 08/03/16, hypertension, Takotsubo cardiomyopathy, diabetes mellitus who came to hospital with complaints of chest pain.  Patient says chest pain started this morning it was associated with shortness of breath and one episode of vomiting at home.  She denies nausea, no diarrhea.  Pain improved after patient was given aspirin and nitroglycerin. She denies abdominal pain.  Denies fever. Complains of chills. Denies dysuria, denies coughing up any phlegm.  Hospital Course:   Chest pain -Has ruled out for ACS by negative troponins and EKG that do not demonstrate any acute ischemic changes. -Review of telemetry since admission shows no arrhythmias. -Chest pain has resolved at time of discharge. -Okay for discharge home today without further cardiology work-up.  Paroxysmal atrial fibrillation -Rate controlled, currently in sinus rhythm, is anticoagulated on Eliquis.  Next  History of Takotsubo cardiomyopathy -Recent echo from August 16 shows an ejection fraction of 55 to 60% with grade 2 diastolic dysfunction.  Continue Cozaar and Lasix.  Hypertension -Well-controlled, continue Cozaar.  Diabetes mellitus -Well-controlled during this hospitalization.  Procedures:  None  Consultations:  None  Discharge Instructions  Discharge Instructions    Diet - low sodium heart healthy   Complete by:  As directed    Increase activity slowly   Complete by:  As directed      Allergies as of 03/25/2018      Reactions   Bee Venom Anaphylaxis   Ceclor [cefaclor] Other (See Comments)   Reaction=burning all over   Penicillins Anaphylaxis   "CLOSES OFF MY BREATHING" Has patient had a PCN reaction causing immediate rash, facial/tongue/throat swelling, SOB or lightheadedness with hypotension: Yes Has patient had a PCN reaction causing severe rash involving mucus membranes or skin necrosis: No Has patient had a PCN reaction that required hospitalization No Has patient had a PCN reaction occurring within the last 10 years: No If all of the above answers are "NO", then may proceed with Cephalosporin use.   Pneumococcal Vaccines Other (See Comments)   PP-23 vaccine; had BIG local red reaction with heat.    Codeine Nausea And Vomiting   Cortisone Hives   All over body   Boniva [ibandronate Sodium] Other (See Comments)   Jaw popping      Medication List    TAKE these medications   acetaminophen 325 MG tablet Commonly known as:  TYLENOL Take 1-2 tablets (325-650 mg total) by mouth every 4 (four) hours as needed for mild pain.   albuterol (2.5 MG/3ML) 0.083% nebulizer solution Commonly known as:  PROVENTIL Take 3 mLs (2.5 mg total) by nebulization every 6 (six) hours as needed for wheezing. DX  491.9 What changed:  Another medication with the same name was changed. Make sure you understand how and when to take each.   PROAIR HFA 108 (90 Base) MCG/ACT inhaler Generic drug:  albuterol INHALE TWO PUFFS INTO THE  LUNGS EVERY 6 HOURS AS NEEDED FOR WHEEZING OR SHORTNESS OF BREATH What changed:  See the new instructions.   ALPRAZolam 0.5 MG tablet Commonly known as:  XANAX Take 0.5 mg by mouth 3 (three) times daily as needed for anxiety or sleep.   apixaban 5 MG Tabs tablet Commonly known as:  ELIQUIS Take 1 tablet (5 mg total) by mouth 2 (two)  times daily.   atorvastatin 40 MG tablet Commonly known as:  LIPITOR Take 1 tablet (40 mg total) by mouth at bedtime.   azelastine 0.1 % nasal spray Commonly known as:  ASTELIN Place 1 spray into both nostrils 2 (two) times daily. Use in each nostril as directed   benzonatate 200 MG capsule Commonly known as:  TESSALON Take 200 mg by mouth 3 (three) times daily as needed for cough.   bismuth subsalicylate 622 [facility]/79GX suspension Commonly known as:  PEPTO BISMOL Take 30 mLs by mouth every 6 (six) hours as needed for diarrhea or loose stools.   calcium carbonate 600 MG Tabs tablet Commonly known as:  OS-CAL Take 600 mg by mouth at bedtime.   cholecalciferol 1000 units tablet Commonly known as:  VITAMIN D Take 1,000 Units by mouth at bedtime.   EPIPEN 2-PAK 0.3 mg/0.3 mL Soaj injection Generic drug:  EPINEPHrine USE AS DIRECTED   ferrous sulfate 325 (65 FE) MG tablet Take 1 tablet (325 mg total) by mouth 2 (two) times daily.   furosemide 20 MG tablet Commonly known as:  LASIX Take 1 tablet (20 mg total) by mouth daily.   isosorbide mononitrate 30 MG 24 hr tablet Commonly known as:  IMDUR Take 1 tablet (30 mg total) by mouth daily.   losartan 50 MG tablet Commonly known as:  COZAAR Take 1 tablet (50 mg total) by mouth daily.   montelukast 10 MG tablet Commonly known as:  SINGULAIR Take 1 tablet (10 mg total) by mouth at bedtime.   PARoxetine 30 MG tablet Commonly known as:  PAXIL Take 30 mg by mouth daily.   SPIRIVA HANDIHALER 18 MCG inhalation capsule Generic drug:  tiotropium PLACE ONE CAPSULE IN INHALER AND INHALE DAILY What changed:  See the new instructions.   vitamin B-12 250 MCG tablet Commonly known as:  CYANOCOBALAMIN Take 250 mcg by mouth at bedtime.      Allergies  Allergen Reactions  . Bee Venom Anaphylaxis  . Ceclor [Cefaclor] Other (See Comments)    Reaction=burning all over  . Penicillins Anaphylaxis    "CLOSES OFF MY BREATHING" Has  patient had a PCN reaction causing immediate rash, facial/tongue/throat swelling, SOB or lightheadedness with hypotension: Yes Has patient had a PCN reaction causing severe rash involving mucus membranes or skin necrosis: No Has patient had a PCN reaction that required hospitalization No Has patient had a PCN reaction occurring within the last 10 years: No If all of the above answers are "NO", then may proceed with Cephalosporin use.   . Pneumococcal Vaccines Other (See Comments)    PP-23 vaccine; had BIG local red reaction with heat.   . Codeine Nausea And Vomiting  . Cortisone Hives    All over body  . Boniva [Ibandronate Sodium] Other (See Comments)    Jaw popping   Follow-up Information    Velna Hatchet, MD. Schedule an appointment as soon as possible for a visit in 2 week(s).   Specialty:  Internal Medicine Contact information: [address] [place] [address] [place],  Ocie Doyne, MD .   Specialty:  Cardiology Contact information: [place] [address] [phone]            The results of significant diagnostics from this hospitalization (including imaging, microbiology, ancillary and laboratory) are listed below for reference.    Significant Diagnostic Studies: Dg Chest Port 1 View  Result Date: 03/24/2018 CLINICAL DATA:  Central chest pain, worsening shortness of breath. EXAM: PORTABLE CHEST 1 VIEW COMPARISON:  Chest x-rays dated 11/07/2016 and 02/24/2016. FINDINGS: Heart size and mediastinal contours are within normal limits. Chronic interstitial prominence appears stable. No new confluent opacity to suggest a developing pneumonia. No pleural effusion or pneumothorax seen. Osseous structures about the chest are unremarkable. IMPRESSION: 1. No active disease. No evidence of pneumonia or pulmonary edema. 2. Chronic interstitial prominence, suggesting some degree of chronic interstitial lung disease and/or chronic  bronchitic change. Electronically Signed   By: Franki Cabot M.D.   On: 03/24/2018 20:54    Microbiology: No results found for this or any previous visit (from the past 240 hour(s)).   Labs: Basic Metabolic Panel: Recent Labs  Lab 03/24/18 2145 03/24/18 2149 03/25/18 0440  NA 142 141 141  K 3.7 3.9 3.6  CL 106 103 105  CO2 26  --  27  GLUCOSE 139* 134* 162*  BUN 8 8 10   CREATININE 0.78 0.70 0.72  CALCIUM 9.2  --  9.2   Liver Function Tests: Recent Labs  Lab 03/25/18 0440  AST 20  ALT 21  ALKPHOS 73  BILITOT 0.4  PROT 6.9  ALBUMIN 4.0   No results for input(s): LIPASE, AMYLASE in the last 168 hours. No results for input(s): AMMONIA in the last 168 hours. CBC: Recent Labs  Lab 03/24/18 2145 03/24/18 2149 03/25/18 0440  WBC 6.3  --  5.4  NEUTROABS 3.8  --   --   HGB 12.6 12.2 12.3  HCT 37.3 36.0 37.4  MCV 93.5  --  94.2  PLT 184  --  184   Cardiac Enzymes: Recent Labs  Lab 03/24/18 2145 03/25/18 0440 03/25/18 0855  TROPONINI <0.03 <0.03 <0.03   BNP: BNP (last 3 results) Recent Labs    03/24/18 2146  BNP 23.0    ProBNP (last 3 results) No results for input(s): PROBNP in the last 8760 hours.  CBG: Recent Labs  Lab 03/25/18 0037 03/25/18 0738  GLUCAP 170* 116*       Signed:  Lelon Frohlich  [facility] Pager: [phone] 03/25/2018, 12:08 PM

## 2018-03-25 NOTE — Progress Notes (Signed)
Removed IV-clean, dry, intact. Reviewed d/c paperwork with patient and husband. Answered all questions. Patient's belongings were misplaced (bra and shirt). [facility] spoke with her about reimbursing her for those items. Gave her a paper gown to leave in. Walked stable patient to main entrance where she was picked up by her husband.

## 2018-03-25 NOTE — ED Notes (Signed)
Pt transferred to unit.

## 2018-03-27 LAB — HIV ANTIBODY (ROUTINE TESTING W REFLEX): HIV Screen 4th Generation wRfx: NONREACTIVE

## 2018-04-02 IMAGING — CT CT HEART MORPH/PULM VEIN W/ CM & W/O CA SCORE
1 of 10 series · 1 of 20 positions shown, 2 images · non-contrast
Comparison: Chest CT 02/24/2016.

CLINICAL DATA: Atrial fibrillation, pre-ablation

EXAM:
Cardiac CTA
MEDICATIONS:
Sub lingual nitro. 4mg
TECHNIQUE: The patient was scanned on a [employer] [REDACTED]ice scanner. Gantry
rotation speed was 270 msecs. Collimation was .9mm. A 100 kV
prospective scan was triggered in the descending thoracic aorta at
111 HU's with 5% padding centered around 78% of the R-R interval.
Average HR during the scan was 58 bpm. The 3D data set was
interpreted on a dedicated work station using MPR, MIP and VRT
modes. A total of 80cc of contrast was used.

[Series 300: locator · axial · 0.35mm/px · z∈[+908,+908]mm · 1 of 1 slices shown, 2 images]
[im 1/1  vessel]
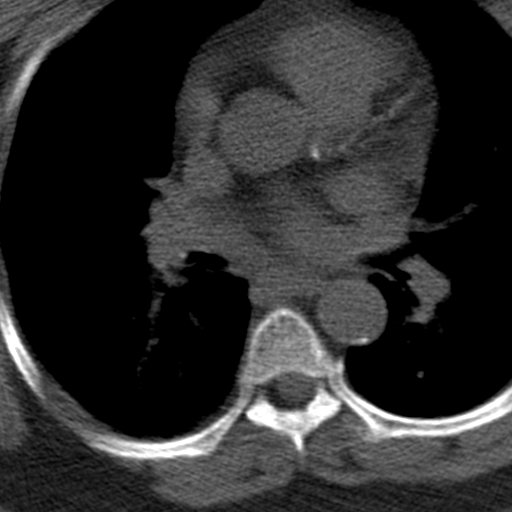
[im 1/1  lung]
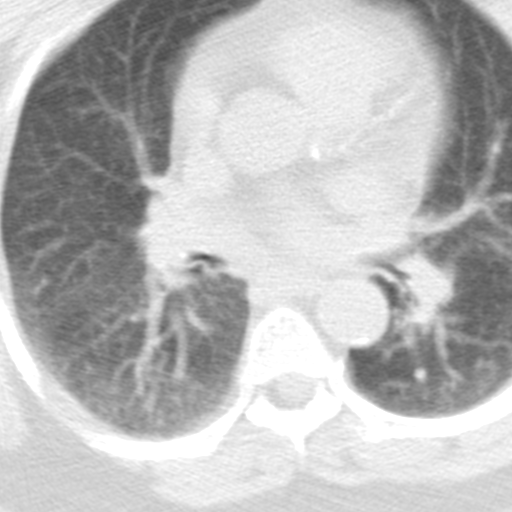

[1 of 20 positions shown; findings below may reference images not displayed]

FINDINGS: Non-cardiac: See separate report from [REDACTED].

Calcium Score:  207 Agatston units.

Coronary Arteries: Right dominant with no anomalies

LM: Calcified plaque in the ostial left main with no more than mild
stenosis.

LAD: Calcified plaque in the ostial LAD with mild stenosis.
Medium-sized high diagonal with calcified plaque in the ostium, mild
stenosis. Calcified plaque throughout the proximal LAD, < 50%
stenosis.

Circumflex: Calcified plaque in the proximal LCx with mild stenosis.
Large PLOM without significant disease.

RCA: Calcified plaque in the proximal RCA without significant
stenosis.

No thrombus noted in the left atrial appendage.

Pulmonary veins drain normally to the left atrium.

LUPV 17 x 13 mm

LLPV 15 x 12 mm

RUPV 17 x 13 mm

RLPV 16 x 16 mm
IMPRESSION: 1. Coronary artery calcium score of 207 Agatston units places the
patient in the 92nd percentile for age and gender.

2. Extensive plaque, especially in the proximal LAD, but there does
not appear to be significantly obstructive disease present.

3.  No LA appendage thrombus noted.

4.  Pulmonary veins as described above.

Enri Ilir Ganiu

EXAM:
OVER-READ INTERPRETATION  CT CHEST

The following report is an over-read performed by radiologist Dr.
over-read does not include interpretation of cardiac or coronary
anatomy or pathology. The coronary calcium score/coronary CTA
interpretation by the cardiologist is attached.
FINDINGS: Aortic atherosclerosis. Small hiatal hernia. Within the visualized
portions of the thorax there are no suspicious appearing pulmonary
nodules or masses, there is no acute consolidative airspace disease,
no pleural effusions, no pneumothorax and no lymphadenopathy.
Diffuse low attenuation throughout the visualized hepatic
parenchyma, compatible with hepatic steatosis. There are no
aggressive appearing lytic or blastic lesions noted in the
visualized portions of the skeleton.
IMPRESSION: 1. Aortic atherosclerosis.
2. Small hiatal hernia.
3. Hepatic steatosis.

## 2018-05-02 ENCOUNTER — Telehealth: Payer: Self-pay | Admitting: Internal Medicine

## 2018-05-02 DIAGNOSIS — J209 Acute bronchitis, unspecified: Secondary | ICD-10-CM

## 2018-05-02 DIAGNOSIS — J42 Unspecified chronic bronchitis: Principal | ICD-10-CM

## 2018-05-02 DIAGNOSIS — J449 Chronic obstructive pulmonary disease, unspecified: Secondary | ICD-10-CM

## 2018-05-02 MED ORDER — ALBUTEROL SULFATE (2.5 MG/3ML) 0.083% IN NEBU: 2.5000 mg | INHALATION_SOLUTION | Freq: Four times a day (QID) | RESPIRATORY_TRACT | 12 refills | Status: DC | PRN

## 2018-05-02 NOTE — Telephone Encounter (Signed)
Called and spoke with patient. Verified pharmacy and DME company. Orders placed for nebulizer supplies and medication.

## 2018-05-09 ENCOUNTER — Ambulatory Visit (INDEPENDENT_AMBULATORY_CARE_PROVIDER_SITE_OTHER): Payer: Medicare Other | Admitting: Cardiology

## 2018-05-09 ENCOUNTER — Encounter: Payer: Self-pay | Admitting: Cardiology

## 2018-05-09 ENCOUNTER — Encounter (INDEPENDENT_AMBULATORY_CARE_PROVIDER_SITE_OTHER): Payer: Self-pay

## 2018-05-09 ENCOUNTER — Encounter: Payer: Self-pay | Admitting: *Deleted

## 2018-05-09 VITALS — BP 120/56 | HR 54 | Ht 60.0 in | Wt 174.0 lb

## 2018-05-09 DIAGNOSIS — I480 Paroxysmal atrial fibrillation: Secondary | ICD-10-CM

## 2018-05-09 DIAGNOSIS — I25119 Atherosclerotic heart disease of native coronary artery with unspecified angina pectoris: Secondary | ICD-10-CM | POA: Diagnosis not present

## 2018-05-09 DIAGNOSIS — Z23 Encounter for immunization: Secondary | ICD-10-CM

## 2018-05-09 DIAGNOSIS — R079 Chest pain, unspecified: Secondary | ICD-10-CM | POA: Diagnosis not present

## 2018-05-09 DIAGNOSIS — I10 Essential (primary) hypertension: Secondary | ICD-10-CM | POA: Diagnosis not present

## 2018-05-09 NOTE — Patient Instructions (Signed)
Medication Instructions:  Your physician recommends that you continue on your current medications as directed. Please refer to the Current Medication list given to you today.  If you need a refill on your cardiac medications before your next appointment, please call your pharmacy.   Lab work: None ordered  Testing/Procedures: Your physician has requested that you have a lexiscan myoview. For further information please visit [email or web address]. Please follow instruction sheet, as given.  Your physician has recommended that you wear a 2 week event monitor. Event monitors are medical devices that record the heart's electrical activity. Doctors most often [place] these monitors to diagnose arrhythmias. Arrhythmias are problems with the speed or rhythm of the heartbeat. The monitor is a small, portable device. You can wear one while you do your normal daily activities. This is usually used to diagnose what is causing palpitations/syncope (passing out).    Follow-Up: At [facility], you and your health needs are our priority.  As part of our continuing mission to provide you with exceptional heart care, we have created designated Provider Care Teams.  These Care Teams include your primary Cardiologist (physician) and Advanced Practice Providers (APPs -  Physician Assistants and Nurse Practitioners) who all work together to provide you with the care you need, when you need it. You will need a follow up appointment in 6 months.  Please call our office 2 months in advance to schedule this appointment.  You may see Dr. Curt Bears or one of the following Advanced Practice Providers on your designated Care Team:   Chanetta Marshall, NP . Tommye Standard, PA-C   Any Other Special Instructions Will Be Listed Below (If Applicable). Cardiac Nuclear Scan A cardiac nuclear scan is a test that measures blood flow to the heart when a person is resting and when he or she is exercising. The test looks for problems such  as:  Not enough blood reaching a portion of the heart.  The heart muscle not working normally.  You may need this test if:  You have heart disease.  You have had abnormal lab results.  You have had heart surgery or angioplasty.  You have chest pain.  You have shortness of breath.  In this test, a radioactive dye (tracer) is injected into your bloodstream. After the tracer has traveled to your heart, an imaging device is used to measure how much of the tracer is absorbed by or distributed to various areas of your heart. This procedure is usually done at a hospital and takes 2-4 hours. Tell a health care provider about:  Any allergies you have.  All medicines you are taking, including vitamins, herbs, eye drops, creams, and over-the-counter medicines.  Any problems you or family members have had with the use of anesthetic medicines.  Any blood disorders you have.  Any surgeries you have had.  Any medical conditions you have.  Whether you are pregnant or may be pregnant. What are the risks? Generally, this is a safe procedure. However, problems may occur, including:  Serious chest pain and heart attack. This is only a risk if the stress portion of the test is done.  Rapid heartbeat.  Sensation of warmth in your chest. This usually passes quickly.  What happens before the procedure?  Ask your health care provider about changing or stopping your regular medicines. This is especially important if you are taking diabetes medicines or blood thinners.  Remove your jewelry on the day of the procedure. What happens during the procedure?  An IV  tube will be inserted into one of your veins.  Your health care provider will inject a small amount of radioactive tracer through the tube.  You will wait for 20-40 minutes while the tracer travels through your bloodstream.  Your heart activity will be monitored with an electrocardiogram (ECG).  You will lie down on an exam  table.  Images of your heart will be taken for about 15-20 minutes.  You may be asked to exercise on a treadmill or stationary bike. While you exercise, your heart's activity will be monitored with an ECG, and your blood pressure will be checked. If you are unable to exercise, you may be given a medicine to increase blood flow to parts of your heart.  When blood flow to your heart has peaked, a tracer will again be injected through the IV tube.  After 20-40 minutes, you will get back on the exam table and have more images taken of your heart.  When the procedure is over, your IV tube will be removed. The procedure may vary among health care providers and hospitals. Depending on the type of tracer used, scans may need to be repeated 3-4 hours later. What happens after the procedure?  Unless your health care provider tells you otherwise, you may return to your normal schedule, including diet, activities, and medicines.  Unless your health care provider tells you otherwise, you may increase your fluid intake. This will help flush the contrast dye from your body. Drink enough fluid to keep your urine clear or pale yellow.  It is up to you to get your test results. Ask your health care provider, or the department that is doing the test, when your results will be ready. Summary  A cardiac nuclear scan measures the blood flow to the heart when a person is resting and when he or she is exercising.  You may need this test if you are at risk for heart disease.  Tell your health care provider if you are pregnant.  Unless your health care provider tells you otherwise, increase your fluid intake. This will help flush the contrast dye from your body. Drink enough fluid to keep your urine clear or pale yellow. This information is not intended to replace advice given to you by your health care provider. Make sure you discuss any questions you have with your health care provider. Document Released:  08/12/2004 Document Revised: 07/20/2016 Document Reviewed: 06/26/2013 Elsevier Interactive Patient Education  2017 [employer].

## 2018-05-09 NOTE — Progress Notes (Signed)
Electrophysiology Office Note   Date:  05/09/2018   ID:  [name] [name], [name] [name] [birth year], MRN [id number]  PCP:  Velna Hatchet, MD  Cardiologist:  Nahser Primary Electrophysiologist:  [name] Meredith Leeds, MD    No chief complaint on file.    History of Present Illness: [name] [name] is a 67 y.o. female who presents today for electrophysiology evaluation.   Hx paroxysmal atrial fibrillaiton, Takotsubo, HLD. Her EF has normalized from a low of 15-20%. She was admitted to the hospital in July with chest pain, and was found to have atrial fibrillation with RVR. She had a stress test which was a low risk study. An echo at the time showed that her EF was normal. Since being seen, she has been maintained on flecainide. She has been compliant with her anticoagulation. AF ablation 08/03/16.  Today, denies symptoms of shortness of breath, orthopnea, PND, lower extremity edema, claudication, dizziness, presyncope, syncope, bleeding, or neurologic sequela. The patient is tolerating medications without difficulties.  He is continued to have episodes of chest pain.  Her chest pain occurs when she exerts herself and improves when she rests.  This does not happen every time she exerts herself.  She also gets intermittent palpitations when she exerts herself.  She has weakness and fatigue most of the time which has been a chronic issue for her.   Past Medical History:  Diagnosis Date  . Anemia    hx of years ago   . Anxiety   . Aortic valve disorders   . Arthritis   . Asthma   . Barrett esophagus   . Bipolar disorder ([place])   . CHF (congestive heart failure) ([place])   . Chronic bronchitis ([place])   . Chronic kidney disease    "I don't have but 1" (08/03/2016)  . COPD (chronic obstructive pulmonary disease) ([place])    "just have a touch" (08/03/2016)  . Depression   . Esophageal reflux   . Esophageal stricture   . Fibromyalgia    "hands, knees, elbows; back; think it's in my hips" (08/03/2016)  .  GAD (generalized anxiety disorder)   . GERD (gastroesophageal reflux disease)   . Hiatal hernia   . History of blood transfusion 1970s; 01/08/2013   "low blood count"  . History of kidney stones 01-08-13   past hx.  . History of nuclear stress test 01/2016   low risk study  . History of stomach ulcers    "some have bleed"  . Hyperlipemia   . Hypertension 12/07/2012  . Insomnia, unspecified   . Myocardial infarction [facility]) 01-08-13   '06-Chest pain-no stent-dx. MI-stress related  . Osteoporosis   . Personal history of colonic polyps 09/2010   TUBULAR ADENOMAS (X3); NEGATIVE FOR HIGH GRADE DYSPLASIA OR MALIGNANCY.  [name] Pneumonia    "several times" (08/03/2016)  . Stroke [facility])    "2 or 3"; remains with some right sided weaknes (08/03/2016)  . Takotsubo syndrome   . Tremor, essential 03/20/2017  . Type II diabetes mellitus ([place])   . Urinary frequency    AND INCONTINENCE   Past Surgical History:  Procedure Laterality Date  . ATRIAL FIBRILLATION ABLATION  08/03/2016  . CARDIAC CATHETERIZATION     normal per Dr Acie Fredrickson in note dated 02/06/12   . CARDIAC CATHETERIZATION     "I've had 2 or 3" (08/03/2016)  . CARPAL TUNNEL RELEASE Right   . CATARACT EXTRACTION W/ INTRAOCULAR LENS  IMPLANT, BILATERAL    . COLONOSCOPY W/ BIOPSIES AND  POLYPECTOMY    . DILATION AND CURETTAGE OF UTERUS     S/P miscarriage  . ELECTROPHYSIOLOGIC STUDY N/A 08/03/2016   Procedure: Atrial Fibrillation Ablation;  Surgeon: Saadia Dewitt Meredith Leeds, MD;  Location: [facility];  Service: Cardiovascular;  Laterality: N/A;  . ESOPHAGEAL DILATION  01-08-13   2 yrs ago  . HEEL SPUR SURGERY Left 2005  . KNEE ARTHROSCOPY  02/28/2012   Procedure: ARTHROSCOPY KNEE;  Surgeon: Tobi Bastos, MD;  Location: [facility];  Service: Orthopedics;  Laterality: Left;  . LAPAROSCOPIC CHOLECYSTECTOMY  03/2002  . SHOULDER OPEN ROTATOR CUFF REPAIR Right 2011  . TONSILLECTOMY AND ADENOIDECTOMY  1965  . TOTAL KNEE ARTHROPLASTY Left 01/17/2013    Procedure: LEFT TOTAL KNEE ARTHROPLASTY;  Surgeon: Tobi Bastos, MD;  Location: [facility];  Service: Orthopedics;  Laterality: Left;  . TOTAL KNEE ARTHROPLASTY Right 06/19/2014   Procedure: RIGHT TOTAL KNEE ARTHROPLASTY;  Surgeon: Tobi Bastos, MD;  Location: [facility];  Service: Orthopedics;  Laterality: Right;  . TUBAL LIGATION    . VAGINAL HYSTERECTOMY     "partial"  . WRIST FLEXION TENDON TENOTOMIES AND PROXIMAL CORPECTOMY W/ WRIST ARTHRODESIS&ILIAC CREST BONE GRAFT       Current Outpatient Medications  Medication Sig Dispense Refill  . acetaminophen (TYLENOL) 325 MG tablet Take 1-2 tablets (325-650 mg total) by mouth every 4 (four) hours as needed for mild pain.    [name] albuterol (PROVENTIL) (2.5 MG/3ML) 0.083% nebulizer solution Take 3 mLs (2.5 mg total) by nebulization every 6 (six) hours as needed for wheezing. DX  491.9, J44.9 360 mL 12  . ALPRAZolam (XANAX) 0.5 MG tablet Take 0.5 mg by mouth 3 (three) times daily as needed for anxiety or sleep.     [name] atorvastatin (LIPITOR) 40 MG tablet Take 1 tablet (40 mg total) by mouth at bedtime. 30 tablet 0  . azelastine (ASTELIN) 0.1 % nasal spray Place 1 spray into both nostrils 2 (two) times daily. Use in each nostril as directed    . benzonatate (TESSALON) 200 MG capsule Take 200 mg by mouth 3 (three) times daily as needed for cough.    . bismuth subsalicylate (PEPTO BISMOL) 262 MG/15ML suspension Take 30 mLs by mouth every 6 (six) hours as needed for diarrhea or loose stools.    . calcium carbonate (OS-CAL) 600 MG TABS Take 600 mg by mouth at bedtime.     . cholecalciferol (VITAMIN D) 1000 UNITS tablet Take 1,000 Units by mouth at bedtime.     [name] EPIPEN 2-PAK 0.3 MG/0.3ML SOAJ injection USE AS DIRECTED 1 Device 6  . furosemide (LASIX) 20 MG tablet Take 1 tablet (20 mg total) by mouth daily. 90 tablet 2  . isosorbide mononitrate (IMDUR) 30 MG 24 hr tablet Take 1 tablet (30 mg total) by mouth daily. 90 tablet 2  . losartan (COZAAR) 50 MG  tablet Take 1 tablet (50 mg total) by mouth daily. 90 tablet 2  . montelukast (SINGULAIR) 10 MG tablet Take 1 tablet (10 mg total) by mouth at bedtime. 30 tablet 11  . PARoxetine (PAXIL) 30 MG tablet Take 30 mg by mouth daily.  1  . PROAIR HFA 108 (90 Base) MCG/ACT inhaler INHALE TWO PUFFS INTO THE LUNGS EVERY 6 HOURS AS NEEDED FOR WHEEZING OR SHORTNESS OF BREATH 8.5 g 11  . SPIRIVA HANDIHALER 18 MCG inhalation capsule PLACE ONE CAPSULE IN INHALER AND INHALE DAILY 30 capsule 6  . vitamin B-12 (CYANOCOBALAMIN) 250 MCG tablet Take 250  mcg by mouth at bedtime.      No current facility-administered medications for this visit.    Facility-Administered Medications Ordered in Other Visits  Medication Dose Route Frequency Provider Last Rate Last Dose  . tranexamic acid (CYKLOKAPRON) 2,000 mg in sodium chloride 0.9 % 50 mL Topical Application  7,616 mg Topical Once Cecilio Asper, [employer], PA-C        Allergies:   Bee venom; Ceclor [cefaclor]; Penicillins; Pneumococcal vaccines; Codeine; Cortisone; and Boniva [ibandronate sodium]   Social History:  The patient  reports that she quit smoking about 2 years ago. Her smoking use included cigarettes. She has a 25.00 pack-year smoking history. She quit smokeless tobacco use about 6 years ago.  Her smokeless tobacco use included snuff and chew. She reports that she drinks alcohol. She reports that she does not use drugs.   Family History:  The patient's family history includes Breast cancer in her maternal aunt, maternal grandmother, and mother; Colon cancer (age of onset: 16) in her brother; Colon polyps in her brother; Diabetes in her maternal grandfather; Heart attack in her father; Heart disease in her unknown relative; Heart failure in her brother and father; Kidney disease in her unknown relative; Stroke in her mother; Transient ischemic attack in her mother.    ROS:  Please see the history of present illness.   Otherwise, review of systems is positive for chest  pain, leg swelling, cough, shortness of breath.   All other systems are reviewed and negative.   PHYSICAL EXAM: VS:  BP (!) 120/56   Pulse (!) 54   Ht 5' (1.524 m)   Wt 174 lb (78.9 kg)   SpO2 96%   BMI 33.98 kg/m  , BMI Body mass index is 33.98 kg/m. GEN: Well nourished, well developed, in no acute distress  HEENT: normal  Neck: no JVD, carotid bruits, or masses Cardiac: RRR; no murmurs, rubs, or gallops,no edema  Respiratory:  clear to auscultation bilaterally, normal work of breathing GI: soft, nontender, nondistended, + BS MS: no deformity or atrophy  Skin: warm and dry Neuro:  Strength and sensation are intact Psych: euthymic mood, full affect  EKG:  EKG is not ordered today. Personal review of the ekg ordered 03/24/18 shows sinus rhythm with PACs   Recent Labs: 03/24/2018: B Natriuretic Peptide 23.0 03/25/2018: ALT 21; BUN 10; Creatinine, Ser 0.72; Hemoglobin 12.3; Platelets 184; Potassium 3.6; Sodium 141    Lipid Panel     Component Value Date/Time   CHOL 127 02/25/2016 0313   CHOL 129 12/07/2012 1445   TRIG 173 (H) 02/25/2016 0313   TRIG 147 12/07/2012 1445   HDL 25 (L) 02/25/2016 0313   HDL 40 12/07/2012 1445   CHOLHDL 5.1 02/25/2016 0313   VLDL 35 02/25/2016 0313   LDLCALC 67 02/25/2016 0313   LDLCALC 60 12/07/2012 1445     Wt Readings from Last 3 Encounters:  05/09/18 174 lb (78.9 kg)  03/24/18 177 lb 6.1 oz (80.5 kg)  03/12/18 177 lb 6.4 oz (80.5 kg)      Other studies Reviewed: Additional studies/ records that were reviewed today include: TTE 03/02/17 Review of the above records today demonstrates:  - Left ventricle: Distal septal hypokinesis. The cavity size was   normal. Systolic function was normal. The estimated ejection   fraction was 55%. Wall motion was normal; there were no regional   wall motion abnormalities. Left ventricular diastolic function   parameters were normal. - Atrial septum: No defect or patent foramen  ovale was  identified. - Pulmonary arteries: PA peak pressure: 34 mm Hg (S).  02/23/16 SPECT  The left ventricular ejection fraction is normal (55-65%).  Nuclear stress EF: 60%.  There was no ST segment deviation noted during stress.  No T wave inversion was noted during stress.  Defect 1: There is a small defect of moderate severity present in the apical anterior and apex location. Consistent with breast attenuation and unchanged from 2015.  The study is normal.  This is a low risk study.   30 day monitor 04/26/17 - personally reviewed Sinus rhythm, sinus tachycardia, sinus bradycardia, occasional PVCs seen Zero atrial fibrillation Symptoms of chest pain, chest tightness associated with sinus rhythm  ASSESSMENT AND PLAN:  1.  Paroxysmal atrial fibrillation: Currently on Eliquis.  Status post AF ablation 08/03/2016.  No obvious recurrence but she is having palpitations.  Due to that, we Kohle Winner fit her with a ZIO patch.   This patients CHA2DS2-VASc Score and unadjusted Ischemic Stroke Rate (% per year) is equal to 3.2 % stroke rate/year from a score of 3  Above score calculated as 1 point each if present [CHF, HTN, DM, Vascular=MI/PAD/Aortic Plaque, Age if 65-74, or Female] Above score calculated as 2 points each if present [Age > 75, or Stroke/TIA/TE]    2. Hypertension: Pressure well controlled.  No changes.  3. takotsubo cardiomyopathy: Currently on Lasix and ARB.  Well compensated.  No changes.  4.  Coronary artery disease: Is currently having chest pain that does not quite appear totally typical.  She does have coronary artery disease as noted on her CT prior to her AF ablation.  We Miyana Mordecai get a Myoview for further evaluation.  If she does have anterior wall ischemia, she would likely benefit from catheterization.  This plan was discussed with interventional cardiology.  Current medicines are reviewed at length with the patient today.   The patient does not have concerns regarding her  medicines.  The following changes were made today: none  Labs/ tests ordered today include:  Orders Placed This Encounter  Procedures  . Myocardial Perfusion Imaging  . LONG TERM MONITOR (3-14 DAYS)     Disposition:   FU with Mylie Mccurley 6 months  Signed, Okie Jansson Meredith Leeds, MD  05/09/2018 11:28 AM     [address] [place] [id number] [phone] (office) [phone] (fax)

## 2018-05-21 ENCOUNTER — Telehealth (HOSPITAL_COMMUNITY): Payer: Self-pay | Admitting: *Deleted

## 2018-05-21 NOTE — Telephone Encounter (Signed)
Patient given detailed instructions per Myocardial Perfusion Study Information Sheet for the test on 05/26/18. Patient notified to arrive 15 minutes early and that it is imperative to arrive on time for appointment to keep from having the test rescheduled.  If you need to cancel or reschedule your appointment, please call the office within 24 hours of your appointment. . Patient verbalized understanding.   Kirstie Peri

## 2018-05-25 ENCOUNTER — Ambulatory Visit (INDEPENDENT_AMBULATORY_CARE_PROVIDER_SITE_OTHER): Payer: Medicare Other

## 2018-05-25 ENCOUNTER — Ambulatory Visit (HOSPITAL_COMMUNITY): Payer: Medicare Other | Attending: Cardiology

## 2018-05-25 DIAGNOSIS — I480 Paroxysmal atrial fibrillation: Secondary | ICD-10-CM | POA: Diagnosis not present

## 2018-05-25 DIAGNOSIS — R079 Chest pain, unspecified: Secondary | ICD-10-CM

## 2018-05-25 LAB — MYOCARDIAL PERFUSION IMAGING
CHL CUP RESTING HR STRESS: 55 {beats}/min
CSEPPHR: 87 {beats}/min
LV sys vol: 20 mL
LVDIAVOL: 58 mL (ref 46–106)
SDS: 2
SRS: 0
SSS: 2
TID: 1.14

## 2018-05-25 MED ORDER — REGADENOSON 0.4 MG/5ML IV SOLN
0.4000 mg | Freq: Once | INTRAVENOUS | Status: AC
  Administered 2018-05-25: 0.4 mg via INTRAVENOUS

## 2018-05-25 MED ORDER — TECHNETIUM TC 99M TETROFOSMIN IV KIT
10.4000 | PACK | Freq: Once | INTRAVENOUS | Status: AC | PRN
  Administered 2018-05-25: 10.4 via INTRAVENOUS
  Filled 2018-05-25: qty 11

## 2018-05-25 MED ORDER — TECHNETIUM TC 99M TETROFOSMIN IV KIT
32.4000 | PACK | Freq: Once | INTRAVENOUS | Status: AC | PRN
  Administered 2018-05-25: 32.4 via INTRAVENOUS
  Filled 2018-05-25: qty 33

## 2018-06-26 ENCOUNTER — Emergency Department (HOSPITAL_COMMUNITY)
Admission: EM | Admit: 2018-06-26 | Discharge: 2018-06-26 | Disposition: A | Discharge status: Home / Self Care | Payer: Medicare Other | Attending: Emergency Medicine | Admitting: Emergency Medicine

## 2018-06-26 ENCOUNTER — Encounter (HOSPITAL_COMMUNITY): Payer: Self-pay

## 2018-06-26 ENCOUNTER — Other Ambulatory Visit: Payer: Self-pay

## 2018-06-26 ENCOUNTER — Ambulatory Visit: Payer: Medicaid Other | Admitting: Neurology

## 2018-06-26 DIAGNOSIS — J449 Chronic obstructive pulmonary disease, unspecified: Secondary | ICD-10-CM | POA: Insufficient documentation

## 2018-06-26 DIAGNOSIS — R519 Headache, unspecified: Secondary | ICD-10-CM

## 2018-06-26 DIAGNOSIS — Z87891 Personal history of nicotine dependence: Secondary | ICD-10-CM | POA: Diagnosis not present

## 2018-06-26 DIAGNOSIS — E1122 Type 2 diabetes mellitus with diabetic chronic kidney disease: Secondary | ICD-10-CM | POA: Diagnosis not present

## 2018-06-26 DIAGNOSIS — R51 Headache: Secondary | ICD-10-CM | POA: Insufficient documentation

## 2018-06-26 DIAGNOSIS — Z79899 Other long term (current) drug therapy: Secondary | ICD-10-CM | POA: Insufficient documentation

## 2018-06-26 DIAGNOSIS — N189 Chronic kidney disease, unspecified: Secondary | ICD-10-CM | POA: Insufficient documentation

## 2018-06-26 DIAGNOSIS — I509 Heart failure, unspecified: Secondary | ICD-10-CM | POA: Insufficient documentation

## 2018-06-26 NOTE — ED Provider Notes (Signed)
[facility] EMERGENCY DEPARTMENT Provider Note   CSN: [id number] Arrival date & time: 06/26/18  1051     History   Chief Complaint Chief Complaint  Patient presents with  . Facial Pain    HPI [name] [name] is a 67 y.o. female.  HPI   66 year old female presents today with complaints of facial pain.  Patient notes that approximately 2 weeks ago she developed unilateral swelling to the left angle of her jaw and anterior ear.  She notes the swelling extended into the left lateral neck and under her jaw.  Patient notes she called her primary care and spoke with the on-call physician.  She was diagnosed with a sinus infection and sent in antibiotics.  She notes she took these once per day for 7 days (?  Levaquin) cannot recall the antibiotic.  She notes that the swelling went down but did not completely resolve.  She notes her last dose of antibiotics was approximately 6 days ago.  She notes some discomfort in the left jaw, she denies any fevers chills at this point.  She contacted her primary care who recommended she come to the emergency room for evaluation.    Past Medical History:  Diagnosis Date  . Anemia    hx of years ago   . Anxiety   . Aortic valve disorders   . Arthritis   . Asthma   . Barrett esophagus   . Bipolar disorder ([place])   . CHF (congestive heart failure) ([place])   . Chronic bronchitis ([place])   . Chronic kidney disease    "I don't have but 1" (08/03/2016)  . COPD (chronic obstructive pulmonary disease) ([place])    "just have a touch" (08/03/2016)  . Depression   . Esophageal reflux   . Esophageal stricture   . Fibromyalgia    "hands, knees, elbows; back; think it's in my hips" (08/03/2016)  . GAD (generalized anxiety disorder)   . GERD (gastroesophageal reflux disease)   . Hiatal hernia   . History of blood transfusion 1970s; 01/08/2013   "low blood count"  . History of kidney stones 01-08-13   past hx.  . History of nuclear stress test  01/2016   low risk study  . History of stomach ulcers    "some have bleed"  . Hyperlipemia   . Hypertension 12/07/2012  . Insomnia, unspecified   . Myocardial infarction [facility]) 01-08-13   '06-Chest pain-no stent-dx. MI-stress related  . Osteoporosis   . Personal history of colonic polyps 09/2010   TUBULAR ADENOMAS (X3); NEGATIVE FOR HIGH GRADE DYSPLASIA OR MALIGNANCY.  [name] Pneumonia    "several times" (08/03/2016)  . Stroke [facility])    "2 or 3"; remains with some right sided weaknes (08/03/2016)  . Takotsubo syndrome   . Tremor, essential 03/20/2017  . Type II diabetes mellitus ([place])   . Urinary frequency    AND INCONTINENCE    Patient Active Problem List   Diagnosis Date Noted  . Tremor, essential 03/20/2017  . Syncope and collapse 03/20/2017  . AF (atrial fibrillation) ([place]) 08/03/2016  . Hypokalemia 02/25/2016  . Anxiety 02/24/2016  . Stroke-like symptoms 02/24/2016  . Weakness 01/26/2016  . Atrial fibrillation ([place]) 01/26/2016  . History of colon polyps 01/28/2015  . Dysphagia 01/28/2015  . History of esophageal stricture 01/28/2015  . Primary osteoarthritis of right knee 06/24/2014  . Acute systolic heart failure ([place]) 06/23/2014  . Takotsubo syndrome 06/21/2014  . Elevated troponin 06/20/2014  . Atrial fibrillation  with RVR ([place]) 06/20/2014  . Congestive dilated cardiomyopathy ([place]) 06/20/2014  . Demand ischemia of myocardium ([place]) 06/20/2014  . 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 Respiratory failure requiring intubation ([place]) 06/19/2014  . Acute pulmonary edema ([place]) 06/19/2014  . Acute respiratory failure with hypoxia ([place]) 06/19/2014  . Hypoxemia   . Arterial hypotension   . Gait instability 10/26/2013  . Stroke ([place]) 10/26/2013  . TIA (transient ischemic attack) 10/26/2013  . Tobacco user, hx of 05/20/2013  . Postoperative anemia due to acute blood loss 01/18/2013  . Osteoarthritis of left knee 01/17/2013  . Essential hypertension 12/07/2012  .  Peripheral edema 12/07/2012  . Diabetes mellitus with complication ([place]) 53/66/4403  . COPD mixed type ([place]) 08/05/2012  . Seasonal and perennial allergic rhinitis 08/05/2012  . Urticaria 08/05/2012  . Chest pain 02/06/2012  . Hyperlipidemia 02/06/2012    Past Surgical History:  Procedure Laterality Date  . ATRIAL FIBRILLATION ABLATION  08/03/2016  . CARDIAC CATHETERIZATION     normal per Dr Acie Fredrickson in note dated 02/06/12   . CARDIAC CATHETERIZATION     "I've had 2 or 3" (08/03/2016)  . CARPAL TUNNEL RELEASE Right   . CATARACT EXTRACTION W/ INTRAOCULAR LENS  IMPLANT, BILATERAL    . COLONOSCOPY W/ BIOPSIES AND POLYPECTOMY    . DILATION AND CURETTAGE OF UTERUS     S/P miscarriage  . ELECTROPHYSIOLOGIC STUDY N/A 08/03/2016   Procedure: Atrial Fibrillation Ablation;  Surgeon: Will Meredith Leeds, MD;  Location: [facility];  Service: Cardiovascular;  Laterality: N/A;  . ESOPHAGEAL DILATION  01-08-13   2 yrs ago  . HEEL SPUR SURGERY Left 2005  . KNEE ARTHROSCOPY  02/28/2012   Procedure: ARTHROSCOPY KNEE;  Surgeon: Tobi Bastos, MD;  Location: [facility];  Service: Orthopedics;  Laterality: Left;  . LAPAROSCOPIC CHOLECYSTECTOMY  03/2002  . SHOULDER OPEN ROTATOR CUFF REPAIR Right 2011  . TONSILLECTOMY AND ADENOIDECTOMY  1965  . TOTAL KNEE ARTHROPLASTY Left 01/17/2013   Procedure: LEFT TOTAL KNEE ARTHROPLASTY;  Surgeon: Tobi Bastos, MD;  Location: [facility];  Service: Orthopedics;  Laterality: Left;  . TOTAL KNEE ARTHROPLASTY Right 06/19/2014   Procedure: RIGHT TOTAL KNEE ARTHROPLASTY;  Surgeon: Tobi Bastos, MD;  Location: [facility];  Service: Orthopedics;  Laterality: Right;  . TUBAL LIGATION    . VAGINAL HYSTERECTOMY     "partial"  . WRIST FLEXION TENDON TENOTOMIES AND PROXIMAL CORPECTOMY W/ WRIST ARTHRODESIS&ILIAC CREST BONE GRAFT       OB History   None      Home Medications    Prior to Admission medications   Medication Sig Start Date End Date Taking? Authorizing  Provider  acetaminophen (TYLENOL) 325 MG tablet Take 1-2 tablets (325-650 mg total) by mouth every 4 (four) hours as needed for mild pain. 06/27/14   Love, Ivan Anchors, PA-C  albuterol (PROVENTIL) (2.5 MG/3ML) 0.083% nebulizer solution Take 3 mLs (2.5 mg total) by nebulization every 6 (six) hours as needed for wheezing. DX  491.9, J44.9 05/02/18   Baird Lyons D, MD  ALPRAZolam Duanne Moron) 0.5 MG tablet Take 0.5 mg by mouth 3 (three) times daily as needed for anxiety or sleep.     [provider]  atorvastatin (LIPITOR) 40 MG tablet Take 1 tablet (40 mg total) by mouth at bedtime. 07/09/13   Hassell Done, Mary-Margaret, FNP  azelastine (ASTELIN) 0.1 % nasal spray Place 1 spray into both nostrils 2 (two) times daily. Use in each nostril as directed  [provider]  benzonatate (TESSALON) 200 MG capsule Take 200 mg by mouth 3 (three) times daily as needed for cough.    [provider]  bismuth subsalicylate (PEPTO BISMOL) 262 MG/15ML suspension Take 30 mLs by mouth every 6 (six) hours as needed for diarrhea or loose stools.    [provider]  calcium carbonate (OS-CAL) 600 MG TABS Take 600 mg by mouth at bedtime.     [provider]  cholecalciferol (VITAMIN D) 1000 UNITS tablet Take 1,000 Units by mouth at bedtime.     [provider]  EPIPEN 2-PAK 0.3 MG/0.3ML SOAJ injection USE AS DIRECTED 03/15/16   Baird Lyons D, MD  furosemide (LASIX) 20 MG tablet Take 1 tablet (20 mg total) by mouth daily. 03/12/18   Camnitz, Ocie Doyne, MD  isosorbide mononitrate (IMDUR) 30 MG 24 hr tablet Take 1 tablet (30 mg total) by mouth daily. 03/12/18   Camnitz, Ocie Doyne, MD  losartan (COZAAR) 50 MG tablet Take 1 tablet (50 mg total) by mouth daily. 03/12/18   Camnitz, Will Hassell Done, MD  montelukast (SINGULAIR) 10 MG tablet Take 1 tablet (10 mg total) by mouth at bedtime. 05/15/14   Baird Lyons D, MD  PARoxetine (PAXIL) 30 MG tablet Take 30 mg by mouth daily. 01/11/18    [provider]  PROAIR HFA 108 (90 Base) MCG/ACT inhaler INHALE TWO PUFFS INTO THE LUNGS EVERY 6 HOURS AS NEEDED FOR WHEEZING OR SHORTNESS OF BREATH 12/14/17   Deneise Lever, MD  SPIRIVA HANDIHALER 18 MCG inhalation capsule PLACE ONE CAPSULE IN INHALER AND INHALE DAILY 02/19/18   Deneise Lever, MD  vitamin B-12 (CYANOCOBALAMIN) 250 MCG tablet Take 250 mcg by mouth at bedtime.     [provider]    Family History Family History  Problem Relation Age of Onset  . Breast cancer Mother   . Stroke Mother   . Transient ischemic attack Mother   . Colon cancer Brother 44  . Colon polyps Brother   . Heart failure Brother   . Heart attack Father   . Heart failure Father   . Diabetes Maternal Grandfather   . Kidney disease Unknown        Both sides of family  . Heart disease Unknown        Both sides of family  . Breast cancer Maternal Aunt   . Breast cancer Maternal Grandmother     Social History Social History   Tobacco Use  . Smoking status: Former Smoker    Packs/day: 0.50    Years: 50.00    Pack years: 25.00    Types: Cigarettes    Last attempt to quit: 01/08/2016    Years since quitting: 2.4  . Smokeless tobacco: Former User    Types: Snuff, Chew    Quit date: 12/31/2011  Substance Use Topics  . Alcohol use: Yes    Comment: 08/03/2016 "maybe 1 beer/month; a shot of rum" maybe twice/year"  . Drug use: No     Allergies   Bee venom; Ceclor [cefaclor]; Penicillins; Pneumococcal vaccines; Codeine; Cortisone; and Boniva [ibandronate sodium]   Review of Systems Review of Systems  All other systems reviewed and are negative.    Physical Exam Updated Vital Signs BP 124/62   Pulse (!) 58   Temp 98.4 F (36.9 C) (Oral)   Resp 16   Ht 5' 0.5" (1.537 m)   Wt 75.3 kg   SpO2 100%   BMI 31.89 kg/m  Physical Exam  Constitutional: She is oriented to person, place, and time. She appears well-developed and well-nourished.  HENT:  Head: Normocephalic  and atraumatic.  Face is symmetrical with no swelling or edema, no abnormalities felt on palpation of the face jaw or neck-all exam without swelling or edema-external auditory canals clear TMs normal-no erythema of the nasal mucosa  Eyes: Pupils are equal, round, and reactive to light. Conjunctivae are normal. Right eye exhibits no discharge. Left eye exhibits no discharge. No scleral icterus.  Neck: Normal range of motion. No JVD present. No tracheal deviation present.  Pulmonary/Chest: Effort normal. No stridor.  Neurological: She is alert and oriented to person, place, and time. Coordination normal.  Psychiatric: She has a normal mood and affect. Her behavior is normal. Judgment and thought content normal.  Nursing note and vitals reviewed.    ED Treatments / Results  Labs (all labs ordered are listed, but only abnormal results are displayed) Labs Reviewed - No data to display  EKG None  Radiology No results found.  Procedures Procedures (including critical care time)  Medications Ordered in ED Medications - No data to display   Initial Impression / Assessment and Plan / ED Course  I have reviewed the triage vital signs and the nursing notes.  Pertinent labs & imaging results that were available during my care of the patient were reviewed by me and considered in my medical decision making (see chart for details).     67 year old female presents today for facial pain.  Patients previous illness most likely represented parotitis.  She has no objective swelling, thorough exam was performed with no localized swelling edema or signs of infectious etiology.  She is well-appearing afebrile with no tachycardia.  Patient will follow-up with her primary care provider this week for repeat evaluation and ongoing management.  She is given strict return precautions, she verbalized understanding and agreement to today's plan had no further questions or concerns.  Final Clinical  Impressions(s) / ED Diagnoses   Final diagnoses:  Facial pain    ED Discharge Orders    None       Francee Gentile 06/26/18 [place], DO 06/26/18 1245

## 2018-06-26 NOTE — ED Triage Notes (Signed)
Pt states her PCP put her on abx last week for sinus infection. Pt states symptoms improved but have now returned. She reports facial pain returned today and her PCP sent her back here.

## 2018-06-26 NOTE — ED Notes (Signed)
Pt finished abx last week and reports left sided facial congestion and numbness and ear fullness reported

## 2018-06-26 NOTE — Discharge Instructions (Addendum)
Please read attached information. If you experience any new or worsening signs or symptoms please return to the emergency room for evaluation. Please follow-up with your primary care provider or specialist as discussed.  °

## 2018-07-03 DIAGNOSIS — K121 Other forms of stomatitis: Secondary | ICD-10-CM | POA: Insufficient documentation

## 2018-07-03 DIAGNOSIS — S0340XA Sprain of jaw, unspecified side, initial encounter: Secondary | ICD-10-CM | POA: Insufficient documentation

## 2018-08-30 ENCOUNTER — Ambulatory Visit
Admission: RE | Admit: 2018-08-30 | Discharge: 2018-08-30 | Disposition: A | Discharge status: Home / Self Care | Payer: Medicare Other | Source: Ambulatory Visit | Attending: Internal Medicine | Admitting: Internal Medicine

## 2018-08-30 ENCOUNTER — Other Ambulatory Visit: Payer: Self-pay | Admitting: Internal Medicine

## 2018-08-30 DIAGNOSIS — S0990XA Unspecified injury of head, initial encounter: Secondary | ICD-10-CM

## 2018-09-21 ENCOUNTER — Other Ambulatory Visit: Payer: Self-pay | Admitting: Cardiology

## 2018-09-25 ENCOUNTER — Ambulatory Visit (INDEPENDENT_AMBULATORY_CARE_PROVIDER_SITE_OTHER)
Admission: RE | Admit: 2018-09-25 | Discharge: 2018-09-25 | Disposition: A | Discharge status: Home / Self Care | Payer: Medicare Other | Source: Ambulatory Visit | Attending: Internal Medicine | Admitting: Internal Medicine

## 2018-09-25 ENCOUNTER — Ambulatory Visit (INDEPENDENT_AMBULATORY_CARE_PROVIDER_SITE_OTHER): Payer: Medicare Other | Admitting: Internal Medicine

## 2018-09-25 ENCOUNTER — Encounter: Payer: Self-pay | Admitting: Internal Medicine

## 2018-09-25 VITALS — BP 118/68 | HR 59 | Ht 60.0 in | Wt 165.0 lb

## 2018-09-25 DIAGNOSIS — J849 Interstitial pulmonary disease, unspecified: Secondary | ICD-10-CM | POA: Diagnosis not present

## 2018-09-25 DIAGNOSIS — I480 Paroxysmal atrial fibrillation: Secondary | ICD-10-CM

## 2018-09-25 DIAGNOSIS — J449 Chronic obstructive pulmonary disease, unspecified: Secondary | ICD-10-CM | POA: Diagnosis not present

## 2018-09-25 NOTE — Progress Notes (Signed)
HPI female former smoker followed for chronic bronchitis, allergic rhinitis, complicated by DM 2, history CVA, A. Fib/ CM/CHF PFT 11/05/12 NPSG 05/17/16- WNL AHI 2.2/ hr Echocardiogram 08/10/2016 EF 54-62%, Gr 2 diastolic dysfunction Office Spirometry 11/07/2016-moderate obstructive airways disease. FVC 1.95/70%, FEV1 1.43/67%, ratio 0.73, FEF 25-75% 0.99/49% ------------------------------------------------------------------------------------------------------------  03/09/17- 68 year old female former smoker followed for COPD, allergic rhinitis, complicated by DM 2, history CVA,  A. Fib/CM/CHF Echocardiogram 03/02/17-EF 55%, PA peak 34 mm FOLLOWS FOR:Pt states she has noticed she is more short winded given heat and humdity;clears up when heat settles down.  Tremor being evaluated by neurology. Insurance won't cover Xopenex and we aren't sure how much difference albuterol will make. Has had heart rhythm problems blamed for falls and being managed by cardiology on Eliquis. Likes Spiriva History intense local allergic reaction to pneumonia vaccine.  09/25/2018- 68 year old female former smoker followed for COPD, allergic rhinitis, complicated by DM 2, history CVA,, A. Fib/CM/CHF -----Just getting over the flu/Pneumonia. Still coughing up yellow mucus. There with husband who is oxygen dependent.  She hopes to avoid that for herself.  Reports flu complicated by pneumonia earlier this winter but now feels back to normal.  She is not sure Spiriva helps any.  Uses nebulized albuterol and occasional albuterol rescue inhaler if needed. We discussed suggestion of ILD on last imaging. CXR 1V- 03/24/18-  1. No active disease. No evidence of pneumonia or pulmonary edema. 2. Chronic interstitial prominence, suggesting some degree of chronic interstitial lung disease and/or chronic bronchitic change.  ROS-see HPI + = positive Constitutional:   No-   weight loss, night sweats, fevers, chills, fatigue,  lassitude. HEENT:   +  headaches, difficulty swallowing, tooth/dental problems, sore throat,       No-  Sneezing, itching, no-ear ache, +nasal congestion, +post nasal drip,  CV:  No-   chest pain, orthopnea, PND, swelling in lower extremities, anasarca, dizziness, +palpitations Resp: +  shortness of breath with exertion or at rest.              No-   productive cough,  + non-productive cough,  No- coughing up of blood.              No-   change in color of mucus.   wheezing.   Skin: No- rash or lesions. GI:  No- heartburn, indigestion, no-abdominal pain, nausea, vomiting,  GU: . MS:  + joint pain or swelling.   Neuro-     nothing unusual Psych:  No- change in mood or affect. + depression or anxiety.  No memory loss.  OBJ- Physical Exam General- Alert, Oriented, Affect-appropriate, Distress- none acute. + Overweight Skin- + bruises on face from falling Lymphadenopathy- none Head- atraumatic            Eyes- Gross vision intact, PERRLA, conjunctivae and secretions clear            Ears- Hearing, canals-normal            Nose- Clear, no-Septal dev, mucus, polyps, erosion, perforation             Throat- Mallampati III-IV , mucosa clear , drainage- none, tonsils- atrophic, + dentures Neck- flexible , trachea midline, no stridor , thyroid nl, carotid no bruit Chest - symmetrical excursion , unlabored           Heart/CV- RRR c/w RSR today , no murmur , no gallop  , no rub, nl s1 s2                           -  JVD- none , edema- none, stasis changes- none, varices- none           Lung-  Cough-none , dullness-none, rub- none, wheeze-none           Chest wall-  Abd-  Br/ Gen/ Rectal- Not done, not indicated Extrem- cyanosis- none, clubbing, none, atrophy- none, strength- nl Neuro- grossly intact to observation

## 2018-09-25 NOTE — Patient Instructions (Signed)
Ok to try off Spiriva for a couple of weeks, then restart it so you can decide if it helps your breathing  Order- CXR   Dx ILD      Please call if we can help

## 2018-09-25 NOTE — Assessment & Plan Note (Signed)
She reports recent flu and pneumonia but considers herself back to baseline.  We discussed question of interstitial lung disease on imaging last summer. Plan-CXR recurrent baseline.  Decide from that if we need CT scan with high resolution for ILD evaluation.  Okay to see how she does off of Spiriva, restarting for comparison later.

## 2018-09-25 NOTE — Assessment & Plan Note (Signed)
Exam she is in sinus rhythm at this visit, followed by cardiology.

## 2018-10-19 ENCOUNTER — Other Ambulatory Visit: Payer: Self-pay | Admitting: Internal Medicine

## 2018-10-23 ENCOUNTER — Encounter: Payer: Self-pay | Admitting: *Deleted

## 2018-10-30 ENCOUNTER — Telehealth: Payer: Self-pay | Admitting: *Deleted

## 2018-10-30 NOTE — Telephone Encounter (Addendum)
Followed up with pt to see how she was doing since last seen in the office. Pt reports doing ok.  She has been experiencing occasional AFib but it isn't lasting long and it isn't frequent. She feels like it is more related to her anxiety given current pandemic.  Pt asks what she should do if she if having chest discomfort during anxiety event.  She reports that her PCP stopped her Xanax.  States that ever since then her anxiety has gotten worse.  Advised pt to call PCP and further discuss her concerns and possibly restarting Xanax.  Pt agreeable to plan. Advised to call the office if she experiences more frequent AFib issues and/or she is out of rhythm for more than 24-48 hours. Pt is currently scheduled for July office visit with Dr. Curt Bears. Patient verbalized understanding and agreeable to plan.

## 2018-10-30 NOTE — Telephone Encounter (Signed)
° ° °  Patient states she does not have smartphone or laptop

## 2018-10-30 NOTE — Telephone Encounter (Signed)
Called patient to let them know due to recent [place] and Health Department Protocols, we are not seeing patients in the office. We are instead seeing if they would like to schedule this appointment as a Research scientist (medical) or Laptop. Unable to reach patient. LVMTCB

## 2018-11-08 ENCOUNTER — Ambulatory Visit: Payer: Medicare Other | Admitting: Cardiology

## 2019-01-01 ENCOUNTER — Other Ambulatory Visit: Payer: Self-pay

## 2019-01-01 ENCOUNTER — Ambulatory Visit: Payer: Medicare Other | Attending: Orthopedic Surgery | Admitting: Physical Therapy

## 2019-01-01 DIAGNOSIS — R293 Abnormal posture: Secondary | ICD-10-CM | POA: Diagnosis present

## 2019-01-01 DIAGNOSIS — M542 Cervicalgia: Secondary | ICD-10-CM | POA: Diagnosis present

## 2019-01-01 NOTE — Patient Instructions (Signed)
[facility]  DRY NEEDLING CONSENT FORM   Trigger point dry needling is a physical therapy approach to treat Myofascial Pain and Dysfunction.  Dry Needling (DN) is a valuable and effective way to deactivate myofascial trigger points (muscle knots/pain). It is skilled intervention that uses a thin filiform needle to penetrate the skin and stimulate underlying myofascial trigger points, muscular, and connective tissues for the management of neuromusculoskeletal pain and movement impairments.  A local twitch response (LTR) will be elicited.  This can sometimes feel like a deep ache in the muscle during the procedure. Multiple trigger points in multiple muscles can be treated during each treatment.  No medication of any kind is injected.   As with any medical treatment and procedure, there are possible adverse events.  While significant adverse events are uncommon, they do sometimes occur and must be considered prior to giving consent.  1. Dry needling often causes a "post needling soreness".  There can be an increase in pain from a couple of hours to 2-3 days, followed by an improvement in the overall pain state. 2. Any time a needle is used there is a risk of infection.  However, we are using new, sterile, and disposable needles; infections are extremely rare. 3. There is a possibility that you may bleed or bruise.  You may feel tired and some nausea following treatment. 4. There is a rare possibility of a pneumothorax (air in the chest cavity). 5. Allergic reaction to nickel in the stainless steel needle. 6. If a nerve is touched, it may cause paresthesia (a prickling/shock sensation) which is usually brief, but may continue for a couple of days.  Following treatment stay hydrated.  Continue regular activities but not too vigorous initially after treatment for 24-48 hours.  Dry Needling is best when combined with other physical therapy interventions such as  strengthening, stretching and other therapeutic modalities.   PLEASE ANSWER THE FOLLOWING QUESTIONS:  Do you have a lack of sensation?   Y/N  Do you have a phobia or fear of needles  Y/N  Are you pregnant?    Y/N If yes:  How many weeks? __________ Do you have any implanted devices?  Y/N If yes:  Pacemaker/Spinal Cord Stimulator/Deep Brain Stimulator/Insulin Pump/Other: ________________ Do you have any implants?  Y/N If yes: Breast/Facial/Pecs/Buttocks/Calves/Hip  Replacement/ Knee Replacement/Other: _________ Do you take any blood thinners?   Y/N If yes: Coumadin (Warfarin)/Other: ___________________ Do you have a bleeding disorder?   Y/N If yes: What kind: _________________________________ Do you take any immunosuppressants?  Y/N If yes:   What kind: _________________________________ Do you take anti-inflammatories?   Y/N If yes: What kind: Advil/Aspirin/Other: ________________ Have you ever been diagnosed with Scoliosis? Y/N Have you had back surgery?   Y/N If yes:  Laminectomy/Fusion/Other: ___________________   I have read, or had read to me, the above.  I have had the opportunity to ask any questions.  All of my questions have been answered to my satisfaction and I understand the risks involved with dry needling.  I consent to examination and treatment at [facility], including dry needling, of any and all of my involved and affected muscles.  

## 2019-01-01 NOTE — Therapy (Signed)
[facility] [place], [place], [address] Phone: [phone]   Fax:  [phone]  Physical Therapy Treatment  Patient Details  Name: [name] [name] MRN: [id number] Date of Birth: 12-12-1950 Referring Provider (PT): Estill Bamberg Ward PA-C   Encounter Date: 01/01/2019  PT End of Session - 01/01/19 9030    Visit Number  1    Number of Visits  12    Date for PT Re-Evaluation  02/12/19    Authorization Type  FOTO AT LEAST EVERY 5TH VISIT.  KX MODIFIER AFTER 15 VISITS.  PROGRESS NOTE AT 10TH VISIT.    PT Start Time  1012    PT Stop Time  1101    PT Time Calculation (min)  49 min    Activity Tolerance  Patient tolerated treatment well    Behavior During Therapy  WFL for tasks assessed/performed       Past Medical History:  Diagnosis Date  . Anemia    hx of years ago   . Anxiety   . Aortic valve disorders   . Arthritis   . Asthma   . Barrett esophagus   . Bipolar disorder ([place])   . CHF (congestive heart failure) ([place])   . Chronic bronchitis ([place])   . Chronic kidney disease    "I don't have but 1" (08/03/2016)  . COPD (chronic obstructive pulmonary disease) ([place])    "just have a touch" (08/03/2016)  . Depression   . Esophageal reflux   . Esophageal stricture   . Fibromyalgia    "hands, knees, elbows; back; think it's in my hips" (08/03/2016)  . GAD (generalized anxiety disorder)   . GERD (gastroesophageal reflux disease)   . Hiatal hernia   . History of blood transfusion 1970s; 01/08/2013   "low blood count"  . History of kidney stones 01-08-13   past hx.  . History of nuclear stress test 01/2016   low risk study  . History of stomach ulcers    "some have bleed"  . Hyperlipemia   . Hypertension 12/07/2012  . Insomnia, unspecified   . Myocardial infarction [facility]) 01-08-13   '06-Chest pain-no stent-dx. MI-stress related  . Osteoporosis   . Personal history of colonic polyps 09/2010   TUBULAR ADENOMAS (X3); NEGATIVE FOR HIGH  GRADE DYSPLASIA OR MALIGNANCY.  [name] Pneumonia    "several times" (08/03/2016)  . Stroke [facility])    "2 or 3"; remains with some right sided weaknes (08/03/2016)  . Takotsubo syndrome   . Tremor, essential 03/20/2017  . Type II diabetes mellitus ([place])   . Urinary frequency    AND INCONTINENCE    Past Surgical History:  Procedure Laterality Date  . ATRIAL FIBRILLATION ABLATION  08/03/2016  . CARDIAC CATHETERIZATION     normal per Dr Acie Fredrickson in note dated 02/06/12   . CARDIAC CATHETERIZATION     "I've had 2 or 3" (08/03/2016)  . CARPAL TUNNEL RELEASE Right   . CATARACT EXTRACTION W/ INTRAOCULAR LENS  IMPLANT, BILATERAL    . COLONOSCOPY W/ BIOPSIES AND POLYPECTOMY    . DILATION AND CURETTAGE OF UTERUS     S/P miscarriage  . ELECTROPHYSIOLOGIC STUDY N/A 08/03/2016   Procedure: Atrial Fibrillation Ablation;  Surgeon: Will Meredith Leeds, MD;  Location: [place] [facility];  Service: Cardiovascular;  Laterality: N/A;  . ESOPHAGEAL DILATION  01-08-13   2 yrs ago  . HEEL SPUR SURGERY Left 2005  . KNEE ARTHROSCOPY  02/28/2012   Procedure: ARTHROSCOPY KNEE;  Surgeon: Jori Moll  Fransico Setters, MD;  Location: [facility];  Service: Orthopedics;  Laterality: Left;  . LAPAROSCOPIC CHOLECYSTECTOMY  03/2002  . SHOULDER OPEN ROTATOR CUFF REPAIR Right 2011  . TONSILLECTOMY AND ADENOIDECTOMY  1965  . TOTAL KNEE ARTHROPLASTY Left 01/17/2013   Procedure: LEFT TOTAL KNEE ARTHROPLASTY;  Surgeon: Tobi Bastos, MD;  Location: [facility];  Service: Orthopedics;  Laterality: Left;  . TOTAL KNEE ARTHROPLASTY Right 06/19/2014   Procedure: RIGHT TOTAL KNEE ARTHROPLASTY;  Surgeon: Tobi Bastos, MD;  Location: [facility];  Service: Orthopedics;  Laterality: Right;  . TUBAL LIGATION    . VAGINAL HYSTERECTOMY     "partial"  . WRIST FLEXION TENDON TENOTOMIES AND PROXIMAL CORPECTOMY W/ WRIST ARTHRODESIS&ILIAC CREST BONE GRAFT      There were no vitals filed for this visit.  Subjective Assessment - 01/01/19 1247    Subjective  COVID-19  screen performed prior to patient entering clinic.  The patient presents to the clinic today reporting ongoing  and worsening right sided neck pain and symptoms down her left UE that included numbness and tingling in the tips of her fingers.  She reports pain begain in March of this year.  Her pain is rated at an 8/10 today with pain increasing with neck and shoulder movement.  Rest decrease pain.    Pertinent History  AF, COPD, CVA, TIA, bilateral TKA, right RTC repair and right CTS.    Currently in Pain?  Yes    Pain Score  8     Pain Location  Neck    Pain Orientation  Left    Pain Descriptors / Indicators  Aching;Sharp;Numbness;Tingling    Pain Type  Acute pain         [facility] PT Assessment - 01/01/19 0001      Assessment   Medical Diagnosis  Neck pain with radicular left arm pain.    Referring Provider (PT)  Estill Bamberg Ward PA-C    Onset Date/Surgical Date  --   March 2020   Hand Dominance  --   Ambidextrous.     Restrictions   Weight Bearing Restrictions  No      Balance Screen   Has the patient fallen in the past 6 months  Yes    How many times?  --   2.   Has the patient had a decrease in activity level because of a fear of falling?   No    Is the patient reluctant to leave their home because of a fear of falling?   No      Home Environment   Living Environment  Private residence      Prior Function   Level of Independence  Independent      Observation/Other Assessments   Focus on Therapeutic Outcomes (FOTO)   70% limitation.      Posture/Postural Control   Posture/Postural Control  Postural limitations    Postural Limitations  Rounded Shoulders;Forward head      Deep Tendon Reflexes   DTR Assessment Site  Biceps;Brachioradialis;Triceps    Biceps DTR  0    Brachioradialis DTR  1+    Triceps DTR  1+      ROM / Strength   AROM / PROM / Strength  AROM;Strength      AROM   Overall AROM Comments  Bilateral shoulder flexion= 125 degrees.  Right active cervical  rotation=39 degrees, left rotation= 36 degrees and bilateral active cervical sidebending= 15 degrees.      Strength   Overall  Strength Comments  Left Tricep strength= 4/5.  Bilateral grip strength= 20#.      Palpation   Palpation comment  Tender to palpation over left levator Scapulae and posterior cuff.      Special Tests   Other special tests  (+) left shoulder Impingement test.      Ambulation/Gait   Gait Comments  WNL.                   [facility] Adult PT Treatment/Exercise - 01/01/19 0001      Modalities   Modalities  Electrical Stimulation;Moist Heat      Moist Heat Therapy   Number Minutes Moist Heat  15 Minutes    Moist Heat Location  --   Left cervical/shoulder.     Acupuncturist Location  Left cervical/posterior cuff.    Electrical Stimulation Action  IFC    Electrical Stimulation Parameters  80-150 Hz x 20 minutes.    Electrical Stimulation Goals  Tone;Pain                  PT Long Term Goals - 01/01/19 1406      PT LONG TERM GOAL #1   Title  Independent with a HEP.    Time  6    Period  Weeks    Status  New      PT LONG TERM GOAL #2   Title  Increase active cervical rotation to 60 degrees+ so patient can turn head more easily while driving.    Time  6    Period  Weeks    Status  New      PT LONG TERM GOAL #3   Title  Eliminate UE symptoms.    Time  6    Period  Weeks    Status  New      PT LONG TERM GOAL #4   Title  Perform ADL's with pain not > 3/10.            Plan - 01/01/19 1250    Clinical Impression Statement  The patient presents to OPPT with c/o left sided neck pain and numbness and tingling over the tips of her fingers.  This has been worsenening since March of this year.  She has a significant loss of active cervical range of motion and a loss of bilateral Bicep reflexes.  She is very tender to palpation over her left Levator Scapulae and posterior cuff musculature.   Additionally, she has left Tricep weakness and a positive left shoulder Impingement test.       Patient will benefit from skilled physical therapy intervention to address deficits and pain.    Personal Factors and Comorbidities  Comorbidity 1;Comorbidity 2    Comorbidities  AF, COPD, CVA, TIA, bilateral TKA, right RTC repair and right CTS.    Examination-Activity Limitations  Bathing    Stability/Clinical Decision Making  Evolving/Moderate complexity    Rehab Potential  Good    PT Frequency  2x / week    PT Duration  6 weeks    PT Treatment/Interventions  ADLs/Self Care Home Management;Electrical Stimulation;Cryotherapy;Moist Heat;Ultrasound;Therapeutic exercise;Therapeutic activities;Patient/family education;Manual techniques;Passive range of motion    PT Next Visit Plan  Combo e'stim/U/S f/b STW/M, gentle manual traction, theraband resisted left shoulder IR/ER, chin tucks and cervical extension.    Consulted and Agree with Plan of Care  Patient       Patient will benefit from skilled therapeutic intervention in order to improve  the following deficits and impairments:  Pain, Postural dysfunction, Increased muscle spasms, Decreased activity tolerance, Decreased range of motion, Decreased strength  Visit Diagnosis: Cervicalgia - Plan: PT plan of care cert/re-cert  Abnormal posture - Plan: PT plan of care cert/re-cert     Problem List Patient Active Problem List   Diagnosis Date Noted  . Stomatitis 07/03/2018  . TMJ (sprain of temporomandibular joint), initial encounter 07/03/2018  . Tremor, essential 03/20/2017  . Syncope and collapse 03/20/2017  . AF (atrial fibrillation) ([place]) 08/03/2016  . Hypokalemia 02/25/2016  . Anxiety 02/24/2016  . Stroke-like symptoms 02/24/2016  . Weakness 01/26/2016  . Atrial fibrillation ([place]) 01/26/2016  . History of colon polyps 01/28/2015  . Dysphagia 01/28/2015  . History of esophageal stricture 01/28/2015  . Primary osteoarthritis of right knee  06/24/2014  . Acute systolic heart failure ([place]) 06/23/2014  . Takotsubo syndrome 06/21/2014  . Elevated troponin 06/20/2014  . Atrial fibrillation with RVR ([place]) 06/20/2014  . Congestive dilated cardiomyopathy ([place]) 06/20/2014  . Demand ischemia of myocardium ([place]) 06/20/2014  . 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 Respiratory failure requiring intubation ([place]) 06/19/2014  . Acute pulmonary edema ([place]) 06/19/2014  . Acute respiratory failure with hypoxia ([place]) 06/19/2014  . Hypoxemia   . Arterial hypotension   . Gait instability 10/26/2013  . Stroke ([place]) 10/26/2013  . TIA (transient ischemic attack) 10/26/2013  . Tobacco user, hx of 05/20/2013  . Postoperative anemia due to acute blood loss 01/18/2013  . Osteoarthritis of left knee 01/17/2013  . Essential hypertension 12/07/2012  . Peripheral edema 12/07/2012  . Diabetes mellitus with complication ([place]) 75/79/7282  . COPD mixed type ([place]) 08/05/2012  . Seasonal and perennial allergic rhinitis 08/05/2012  . Urticaria 08/05/2012  . Chest pain 02/06/2012  . Hyperlipidemia 02/06/2012    Ndea Kilroy, [place] MPT 01/01/2019, 2:09 PM  [facility] [address] [place], [place], [address] Phone: [phone]   Fax:  [phone]  Name: FRANCENE MCERLEAN MRN: [id number] Date of Birth: 06-19-1951

## 2019-01-04 ENCOUNTER — Encounter: Payer: Self-pay | Admitting: Physical Therapy

## 2019-01-04 ENCOUNTER — Other Ambulatory Visit: Payer: Self-pay

## 2019-01-04 ENCOUNTER — Ambulatory Visit: Payer: Medicare Other | Admitting: Physical Therapy

## 2019-01-04 DIAGNOSIS — M542 Cervicalgia: Secondary | ICD-10-CM | POA: Diagnosis not present

## 2019-01-04 DIAGNOSIS — R293 Abnormal posture: Secondary | ICD-10-CM

## 2019-01-04 NOTE — Therapy (Signed)
[facility] [place], [place], [address] Phone: [phone]   Fax:  [phone]  Physical Therapy Treatment  Patient Details  Name: [name] [name] MRN: [id number] Date of Birth: May 20, 1951 Referring Provider (PT): Estill Bamberg Ward PA-C   Encounter Date: 01/04/2019  PT End of Session - 01/04/19 1156    Visit Number  2    Number of Visits  12    Date for PT Re-Evaluation  02/12/19    Authorization Type  FOTO AT LEAST EVERY 5TH VISIT.  KX MODIFIER AFTER 15 VISITS.  PROGRESS NOTE AT 10TH VISIT.    PT Start Time  1044    PT Stop Time  1145    PT Time Calculation (min)  61 min       Past Medical History:  Diagnosis Date  . Anemia    hx of years ago   . Anxiety   . Aortic valve disorders   . Arthritis   . Asthma   . Barrett esophagus   . Bipolar disorder ([place])   . CHF (congestive heart failure) ([place])   . Chronic bronchitis ([place])   . Chronic kidney disease    "I don't have but 1" (08/03/2016)  . COPD (chronic obstructive pulmonary disease) ([place])    "just have a touch" (08/03/2016)  . Depression   . Esophageal reflux   . Esophageal stricture   . Fibromyalgia    "hands, knees, elbows; back; think it's in my hips" (08/03/2016)  . GAD (generalized anxiety disorder)   . GERD (gastroesophageal reflux disease)   . Hiatal hernia   . History of blood transfusion 1970s; 01/08/2013   "low blood count"  . History of kidney stones 01-08-13   past hx.  . History of nuclear stress test 01/2016   low risk study  . History of stomach ulcers    "some have bleed"  . Hyperlipemia   . Hypertension 12/07/2012  . Insomnia, unspecified   . Myocardial infarction [facility]) 01-08-13   '06-Chest pain-no stent-dx. MI-stress related  . Osteoporosis   . Personal history of colonic polyps 09/2010   TUBULAR ADENOMAS (X3); NEGATIVE FOR HIGH GRADE DYSPLASIA OR MALIGNANCY.  [name] Pneumonia    "several times" (08/03/2016)  . Stroke [facility])    "2 or 3"; remains  with some right sided weaknes (08/03/2016)  . Takotsubo syndrome   . Tremor, essential 03/20/2017  . Type II diabetes mellitus ([place])   . Urinary frequency    AND INCONTINENCE    Past Surgical History:  Procedure Laterality Date  . ATRIAL FIBRILLATION ABLATION  08/03/2016  . CARDIAC CATHETERIZATION     normal per Dr Acie Fredrickson in note dated 02/06/12   . CARDIAC CATHETERIZATION     "I've had 2 or 3" (08/03/2016)  . CARPAL TUNNEL RELEASE Right   . CATARACT EXTRACTION W/ INTRAOCULAR LENS  IMPLANT, BILATERAL    . COLONOSCOPY W/ BIOPSIES AND POLYPECTOMY    . DILATION AND CURETTAGE OF UTERUS     S/P miscarriage  . ELECTROPHYSIOLOGIC STUDY N/A 08/03/2016   Procedure: Atrial Fibrillation Ablation;  Surgeon: Will Meredith Leeds, MD;  Location: [place] [facility];  Service: Cardiovascular;  Laterality: N/A;  . ESOPHAGEAL DILATION  01-08-13   2 yrs ago  . HEEL SPUR SURGERY Left 2005  . KNEE ARTHROSCOPY  02/28/2012   Procedure: ARTHROSCOPY KNEE;  Surgeon: Tobi Bastos, MD;  [facility];  Service: Orthopedics;  Laterality: Left;  . LAPAROSCOPIC CHOLECYSTECTOMY  03/2002  .  SHOULDER OPEN ROTATOR CUFF REPAIR Right 2011  . TONSILLECTOMY AND ADENOIDECTOMY  1965  . TOTAL KNEE ARTHROPLASTY Left 01/17/2013   Procedure: LEFT TOTAL KNEE ARTHROPLASTY;  Surgeon: Tobi Bastos, MD;  [facility];  Service: Orthopedics;  Laterality: Left;  . TOTAL KNEE ARTHROPLASTY Right 06/19/2014   Procedure: RIGHT TOTAL KNEE ARTHROPLASTY;  Surgeon: Tobi Bastos, MD;  [facility];  Service: Orthopedics;  Laterality: Right;  . TUBAL LIGATION    . VAGINAL HYSTERECTOMY     "partial"  . WRIST FLEXION TENDON TENOTOMIES AND PROXIMAL CORPECTOMY W/ WRIST ARTHRODESIS&ILIAC CREST BONE GRAFT      There were no vitals filed for this visit.  Subjective Assessment - 01/04/19 1056    Subjective  COVID-19 screen performed prior to patient entering clinic.  No new complaints.    Pertinent History  AF, COPD, CVA, TIA,  bilateral TKA, right RTC repair and right CTS.    Currently in Pain?  Yes    Pain Score  8     Pain Descriptors / Indicators  Aching;Sharp;Numbness;Tingling    Pain Type  Acute pain                       [facility] Adult PT Treatment/Exercise - 01/04/19 0001      Modalities   Modalities  Electrical Stimulation;Moist Heat;Ultrasound      Moist Heat Therapy   Number Minutes Moist Heat  20 Minutes    Moist Heat Location  --   Left cervical.     Electrical Stimulation   Electrical Stimulation Location  Left cervical/posterior cuff.    Electrical Stimulation Action  Pre-mod.    Electrical Stimulation Parameters  80-150 Hz x 20 minutes.    Electrical Stimulation Goals  Tone;Pain      Ultrasound   Ultrasound Location  Left UT region.    Ultrasound Parameters  Combo e'stim/U/S at 1.50 W/CM2 x 12 minutes.    Ultrasound Goals  Pain      Manual Therapy   Manual Therapy  Soft tissue mobilization    Soft tissue mobilization  STW/M x 14 minutes to left UT and Levator Scap region to reduce tone.                  PT Long Term Goals - 01/01/19 1406      PT LONG TERM GOAL #1   Title  Independent with a HEP.    Time  6    Period  Weeks    Status  New      PT LONG TERM GOAL #2   Title  Increase active cervical rotation to 60 degrees+ so patient can turn head more easily while driving.    Time  6    Period  Weeks    Status  New      PT LONG TERM GOAL #3   Title  Eliminate UE symptoms.    Time  6    Period  Weeks    Status  New      PT LONG TERM GOAL #4   Title  Perform ADL's with pain not > 3/10.            Plan - 01/04/19 1202    Clinical Impression Statement  Patient with increased tone over left cervical region, especially the left Levator Scapulae.  She responded well to tretament today and felt better after treatment.    Personal Factors and Comorbidities  Comorbidity 1;Comorbidity 2  Comorbidities  AF, COPD, CVA, TIA, bilateral TKA, right  RTC repair and right CTS.    PT Treatment/Interventions  ADLs/Self Care Home Management;Electrical Stimulation;Cryotherapy;Moist Heat;Ultrasound;Therapeutic exercise;Therapeutic activities;Patient/family education;Manual techniques;Passive range of motion    PT Next Visit Plan  Combo e'stim/U/S f/b STW/M, gentle manual traction, theraband resisted left shoulder IR/ER, chin tucks and cervical extension.    Consulted and Agree with Plan of Care  Patient       Patient will benefit from skilled therapeutic intervention in order to improve the following deficits and impairments:  Pain, Postural dysfunction, Increased muscle spasms, Decreased activity tolerance, Decreased range of motion, Decreased strength  Visit Diagnosis: Cervicalgia  Abnormal posture     Problem List Patient Active Problem List   Diagnosis Date Noted  . Stomatitis 07/03/2018  . TMJ (sprain of temporomandibular joint), initial encounter 07/03/2018  . Tremor, essential 03/20/2017  . Syncope and collapse 03/20/2017  . AF (atrial fibrillation) ([place]) 08/03/2016  . Hypokalemia 02/25/2016  . Anxiety 02/24/2016  . Stroke-like symptoms 02/24/2016  . Weakness 01/26/2016  . Atrial fibrillation ([place]) 01/26/2016  . History of colon polyps 01/28/2015  . Dysphagia 01/28/2015  . History of esophageal stricture 01/28/2015  . Primary osteoarthritis of right knee 06/24/2014  . Acute systolic heart failure ([place]) 06/23/2014  . Takotsubo syndrome 06/21/2014  . Elevated troponin 06/20/2014  . Atrial fibrillation with RVR ([place]) 06/20/2014  . Congestive dilated cardiomyopathy ([place]) 06/20/2014  . Demand ischemia of myocardium ([place]) 06/20/2014  . 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 Respiratory failure requiring intubation ([place]) 06/19/2014  . Acute pulmonary edema ([place]) 06/19/2014  . Acute respiratory failure with hypoxia ([place]) 06/19/2014  . Hypoxemia   . Arterial hypotension   . Gait instability 10/26/2013  .  Stroke ([place]) 10/26/2013  . TIA (transient ischemic attack) 10/26/2013  . Tobacco user, hx of 05/20/2013  . Postoperative anemia due to acute blood loss 01/18/2013  . Osteoarthritis of left knee 01/17/2013  . Essential hypertension 12/07/2012  . Peripheral edema 12/07/2012  . Diabetes mellitus with complication ([place]) 92/08/69  . COPD mixed type ([place]) 08/05/2012  . Seasonal and perennial allergic rhinitis 08/05/2012  . Urticaria 08/05/2012  . Chest pain 02/06/2012  . Hyperlipidemia 02/06/2012    Melanie Pellot, [place] MPT 01/04/2019, 12:07 PM  [facility] [address] [place], [place], [address] Phone: [phone]   Fax:  [phone]  Name: [name] [name] MRN: [id number] Date of Birth: 1951/02/01

## 2019-01-07 ENCOUNTER — Other Ambulatory Visit: Payer: Self-pay

## 2019-01-07 ENCOUNTER — Ambulatory Visit: Payer: Medicare Other | Admitting: Physical Therapy

## 2019-01-07 ENCOUNTER — Other Ambulatory Visit: Payer: Self-pay | Admitting: Cardiology

## 2019-01-07 DIAGNOSIS — M542 Cervicalgia: Secondary | ICD-10-CM | POA: Diagnosis not present

## 2019-01-07 DIAGNOSIS — I5021 Acute systolic (congestive) heart failure: Secondary | ICD-10-CM

## 2019-01-07 DIAGNOSIS — R293 Abnormal posture: Secondary | ICD-10-CM

## 2019-01-07 NOTE — Therapy (Signed)
[facility] [place], [place], [address] Phone: [phone]   Fax:  [phone]  Physical Therapy Treatment  Patient Details  Name: [name] [name] MRN: [id number] Date of Birth: 03/01/1951 Referring Provider (PT): Estill Bamberg Ward PA-C   Encounter Date: 01/07/2019  PT End of Session - 01/07/19 1200    Visit Number  3    Number of Visits  12    Date for PT Re-Evaluation  02/12/19    Authorization Type  FOTO AT LEAST EVERY 5TH VISIT.  KX MODIFIER AFTER 15 VISITS.  PROGRESS NOTE AT 10TH VISIT.    PT Start Time  1012    PT Stop Time  1109    PT Time Calculation (min)  57 min    Activity Tolerance  Patient tolerated treatment well    Behavior During Therapy  WFL for tasks assessed/performed       Past Medical History:  Diagnosis Date  . Anemia    hx of years ago   . Anxiety   . Aortic valve disorders   . Arthritis   . Asthma   . Barrett esophagus   . Bipolar disorder ([place])   . CHF (congestive heart failure) ([place])   . Chronic bronchitis ([place])   . Chronic kidney disease    "I don't have but 1" (08/03/2016)  . COPD (chronic obstructive pulmonary disease) ([place])    "just have a touch" (08/03/2016)  . Depression   . Esophageal reflux   . Esophageal stricture   . Fibromyalgia    "hands, knees, elbows; back; think it's in my hips" (08/03/2016)  . GAD (generalized anxiety disorder)   . GERD (gastroesophageal reflux disease)   . Hiatal hernia   . History of blood transfusion 1970s; 01/08/2013   "low blood count"  . History of kidney stones 01-08-13   past hx.  . History of nuclear stress test 01/2016   low risk study  . History of stomach ulcers    "some have bleed"  . Hyperlipemia   . Hypertension 12/07/2012  . Insomnia, unspecified   . Myocardial infarction [facility]) 01-08-13   '06-Chest pain-no stent-dx. MI-stress related  . Osteoporosis   . Personal history of colonic polyps 09/2010   TUBULAR ADENOMAS (X3); NEGATIVE FOR HIGH  GRADE DYSPLASIA OR MALIGNANCY.  [name] Pneumonia    "several times" (08/03/2016)  . Stroke [facility])    "2 or 3"; remains with some right sided weaknes (08/03/2016)  . Takotsubo syndrome   . Tremor, essential 03/20/2017  . Type II diabetes mellitus ([place])   . Urinary frequency    AND INCONTINENCE    Past Surgical History:  Procedure Laterality Date  . ATRIAL FIBRILLATION ABLATION  08/03/2016  . CARDIAC CATHETERIZATION     normal per Dr Acie Fredrickson in note dated 02/06/12   . CARDIAC CATHETERIZATION     "I've had 2 or 3" (08/03/2016)  . CARPAL TUNNEL RELEASE Right   . CATARACT EXTRACTION W/ INTRAOCULAR LENS  IMPLANT, BILATERAL    . COLONOSCOPY W/ BIOPSIES AND POLYPECTOMY    . DILATION AND CURETTAGE OF UTERUS     S/P miscarriage  . ELECTROPHYSIOLOGIC STUDY N/A 08/03/2016   Procedure: Atrial Fibrillation Ablation;  Surgeon: Will Meredith Leeds, MD;  Location: [place] [facility];  Service: Cardiovascular;  Laterality: N/A;  . ESOPHAGEAL DILATION  01-08-13   2 yrs ago  . HEEL SPUR SURGERY Left 2005  . KNEE ARTHROSCOPY  02/28/2012   Procedure: ARTHROSCOPY KNEE;  Surgeon: Jori Moll  Fransico Setters, MD;  [facility];  Service: Orthopedics;  Laterality: Left;  . LAPAROSCOPIC CHOLECYSTECTOMY  03/2002  . SHOULDER OPEN ROTATOR CUFF REPAIR Right 2011  . TONSILLECTOMY AND ADENOIDECTOMY  1965  . TOTAL KNEE ARTHROPLASTY Left 01/17/2013   Procedure: LEFT TOTAL KNEE ARTHROPLASTY;  Surgeon: Tobi Bastos, MD;  [facility];  Service: Orthopedics;  Laterality: Left;  . TOTAL KNEE ARTHROPLASTY Right 06/19/2014   Procedure: RIGHT TOTAL KNEE ARTHROPLASTY;  Surgeon: Tobi Bastos, MD;  [facility];  Service: Orthopedics;  Laterality: Right;  . TUBAL LIGATION    . VAGINAL HYSTERECTOMY     "partial"  . WRIST FLEXION TENDON TENOTOMIES AND PROXIMAL CORPECTOMY W/ WRIST ARTHRODESIS&ILIAC CREST BONE GRAFT      There were no vitals filed for this visit.  Subjective Assessment - 01/07/19 1059    Subjective  COVID-19  screen performed prior to patient entering clinic.  I felt good after last treatment.  Pain came back over the weekend.    Pertinent History  AF, COPD, CVA, TIA, bilateral TKA, right RTC repair and right CTS.    Currently in Pain?  Yes    Pain Score  8     Pain Location  Neck    Pain Orientation  Left    Pain Descriptors / Indicators  Aching;Sharp;Numbness;Tingling    Pain Type  Acute pain                       [facility] Adult PT Treatment/Exercise - 01/07/19 0001      Modalities   Modalities  Electrical Stimulation;Moist Heat      Moist Heat Therapy   Number Minutes Moist Heat  20 Minutes    Moist Heat Location  --   Left cervical.     Electrical Stimulation   Electrical Stimulation Location  Left cervical/posterior cuff.    Electrical Stimulation Action  IFC    Electrical Stimulation Parameters  80-150 Hz x 20 minutes.    Electrical Stimulation Goals  Tone;Pain      Ultrasound   Ultrasound Location  Left UT/left shoulder posterior cuff region.    Ultrasound Parameters  Combo e'stim/U/S at 1.50 W/CM2 x 12 minutes.    Ultrasound Goals  Pain      Manual Therapy   Manual Therapy  Soft tissue mobilization    Soft tissue mobilization  STW/M x 11 minutes to left cervical/UT/post cuff region.                  PT Long Term Goals - 01/01/19 1406      PT LONG TERM GOAL #1   Title  Independent with a HEP.    Time  6    Period  Weeks    Status  New      PT LONG TERM GOAL #2   Title  Increase active cervical rotation to 60 degrees+ so patient can turn head more easily while driving.    Time  6    Period  Weeks    Status  New      PT LONG TERM GOAL #3   Title  Eliminate UE symptoms.    Time  6    Period  Weeks    Status  New      PT LONG TERM GOAL #4   Title  Perform ADL's with pain not > 3/10.            Plan - 01/07/19 1157  Clinical Impression Statement  Patient did well with treatment today.  She was very tender in her left post cuff  region.      Stability/Clinical Decision Making  Evolving/Moderate complexity    PT Treatment/Interventions  ADLs/Self Care Home Management;Electrical Stimulation;Cryotherapy;Moist Heat;Ultrasound;Therapeutic exercise;Therapeutic activities;Patient/family education;Manual techniques;Passive range of motion    PT Next Visit Plan  Combo e'stim/U/S f/b STW/M, gentle manual traction, theraband resisted left shoulder IR/ER, chin tucks and cervical extension.    Consulted and Agree with Plan of Care  Patient       Patient will benefit from skilled therapeutic intervention in order to improve the following deficits and impairments:  Pain, Postural dysfunction, Increased muscle spasms, Decreased activity tolerance, Decreased range of motion, Decreased strength  Visit Diagnosis: Cervicalgia  Abnormal posture     Problem List Patient Active Problem List   Diagnosis Date Noted  . Stomatitis 07/03/2018  . TMJ (sprain of temporomandibular joint), initial encounter 07/03/2018  . Tremor, essential 03/20/2017  . Syncope and collapse 03/20/2017  . AF (atrial fibrillation) ([place]) 08/03/2016  . Hypokalemia 02/25/2016  . Anxiety 02/24/2016  . Stroke-like symptoms 02/24/2016  . Weakness 01/26/2016  . Atrial fibrillation ([place]) 01/26/2016  . History of colon polyps 01/28/2015  . Dysphagia 01/28/2015  . History of esophageal stricture 01/28/2015  . Primary osteoarthritis of right knee 06/24/2014  . Acute systolic heart failure ([place]) 06/23/2014  . Takotsubo syndrome 06/21/2014  . Elevated troponin 06/20/2014  . Atrial fibrillation with RVR ([place]) 06/20/2014  . Congestive dilated cardiomyopathy ([place]) 06/20/2014  . Demand ischemia of myocardium ([place]) 06/20/2014  . 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 Respiratory failure requiring intubation ([place]) 06/19/2014  . Acute pulmonary edema ([place]) 06/19/2014  . Acute respiratory failure with hypoxia ([place]) 06/19/2014  . Hypoxemia   . Arterial  hypotension   . Gait instability 10/26/2013  . Stroke ([place]) 10/26/2013  . TIA (transient ischemic attack) 10/26/2013  . Tobacco user, hx of 05/20/2013  . Postoperative anemia due to acute blood loss 01/18/2013  . Osteoarthritis of left knee 01/17/2013  . Essential hypertension 12/07/2012  . Peripheral edema 12/07/2012  . Diabetes mellitus with complication ([place]) 10/93/2355  . COPD mixed type ([place]) 08/05/2012  . Seasonal and perennial allergic rhinitis 08/05/2012  . Urticaria 08/05/2012  . Chest pain 02/06/2012  . Hyperlipidemia 02/06/2012    Maury Groninger, [place] MPT 01/07/2019, 12:00 PM  [facility] [place], [place], [address] Phone: [phone]   Fax:  [phone]  Name: [name] [name] MRN: [id number] Date of Birth: [birth year]

## 2019-01-11 ENCOUNTER — Ambulatory Visit: Payer: Medicare Other | Admitting: Physical Therapy

## 2019-01-11 ENCOUNTER — Other Ambulatory Visit: Payer: Self-pay

## 2019-01-11 ENCOUNTER — Encounter: Payer: Self-pay | Admitting: Physical Therapy

## 2019-01-11 DIAGNOSIS — R293 Abnormal posture: Secondary | ICD-10-CM

## 2019-01-11 DIAGNOSIS — M542 Cervicalgia: Secondary | ICD-10-CM

## 2019-01-11 NOTE — Therapy (Signed)
[facility] [place], [place], [address] Phone: [phone]   Fax:  [phone]  Physical Therapy Treatment  Patient Details  Name: [name] [name] MRN: [id number] Date of Birth: Oct 24, 1950 Referring Provider (PT): Estill Bamberg Ward PA-C   Encounter Date: 01/11/2019  PT End of Session - 01/11/19 0915    Visit Number  4    Number of Visits  12    Date for PT Re-Evaluation  02/12/19    Authorization Type  FOTO AT LEAST EVERY 5TH VISIT.  KX MODIFIER AFTER 15 VISITS.  PROGRESS NOTE AT 10TH VISIT.    PT Start Time  0915    PT Stop Time  1000    PT Time Calculation (min)  45 min    Activity Tolerance  Patient tolerated treatment well;Patient limited by pain    Behavior During Therapy  [facility] for tasks assessed/performed       Past Medical History:  Diagnosis Date  . Anemia    hx of years ago   . Anxiety   . Aortic valve disorders   . Arthritis   . Asthma   . Barrett esophagus   . Bipolar disorder ([place])   . CHF (congestive heart failure) ([place])   . Chronic bronchitis ([place])   . Chronic kidney disease    "I don't have but 1" (08/03/2016)  . COPD (chronic obstructive pulmonary disease) ([place])    "just have a touch" (08/03/2016)  . Depression   . Esophageal reflux   . Esophageal stricture   . Fibromyalgia    "hands, knees, elbows; back; think it's in my hips" (08/03/2016)  . GAD (generalized anxiety disorder)   . GERD (gastroesophageal reflux disease)   . Hiatal hernia   . History of blood transfusion 1970s; 01/08/2013   "low blood count"  . History of kidney stones 01-08-13   past hx.  . History of nuclear stress test 01/2016   low risk study  . History of stomach ulcers    "some have bleed"  . Hyperlipemia   . Hypertension 12/07/2012  . Insomnia, unspecified   . Myocardial infarction [facility]) 01-08-13   '06-Chest pain-no stent-dx. MI-stress related  . Osteoporosis   . Personal history of colonic polyps 09/2010   TUBULAR  ADENOMAS (X3); NEGATIVE FOR HIGH GRADE DYSPLASIA OR MALIGNANCY.  [name] Pneumonia    "several times" (08/03/2016)  . Stroke [facility])    "2 or 3"; remains with some right sided weaknes (08/03/2016)  . Takotsubo syndrome   . Tremor, essential 03/20/2017  . Type II diabetes mellitus ([place])   . Urinary frequency    AND INCONTINENCE    Past Surgical History:  Procedure Laterality Date  . ATRIAL FIBRILLATION ABLATION  08/03/2016  . CARDIAC CATHETERIZATION     normal per Dr Acie Fredrickson in note dated 02/06/12   . CARDIAC CATHETERIZATION     "I've had 2 or 3" (08/03/2016)  . CARPAL TUNNEL RELEASE Right   . CATARACT EXTRACTION W/ INTRAOCULAR LENS  IMPLANT, BILATERAL    . COLONOSCOPY W/ BIOPSIES AND POLYPECTOMY    . DILATION AND CURETTAGE OF UTERUS     S/P miscarriage  . ELECTROPHYSIOLOGIC STUDY N/A 08/03/2016   Procedure: Atrial Fibrillation Ablation;  Surgeon: Will Meredith Leeds, MD;  Location: [place] [facility];  Service: Cardiovascular;  Laterality: N/A;  . ESOPHAGEAL DILATION  01-08-13   2 yrs ago  . HEEL SPUR SURGERY Left 2005  . KNEE ARTHROSCOPY  02/28/2012   Procedure: ARTHROSCOPY KNEE;  Surgeon: Tobi Bastos, MD;  [facility];  Service: Orthopedics;  Laterality: Left;  . LAPAROSCOPIC CHOLECYSTECTOMY  03/2002  . SHOULDER OPEN ROTATOR CUFF REPAIR Right 2011  . TONSILLECTOMY AND ADENOIDECTOMY  1965  . TOTAL KNEE ARTHROPLASTY Left 01/17/2013   Procedure: LEFT TOTAL KNEE ARTHROPLASTY;  Surgeon: Tobi Bastos, MD;  [facility];  Service: Orthopedics;  Laterality: Left;  . TOTAL KNEE ARTHROPLASTY Right 06/19/2014   Procedure: RIGHT TOTAL KNEE ARTHROPLASTY;  Surgeon: Tobi Bastos, MD;  [facility];  Service: Orthopedics;  Laterality: Right;  . TUBAL LIGATION    . VAGINAL HYSTERECTOMY     "partial"  . WRIST FLEXION TENDON TENOTOMIES AND PROXIMAL CORPECTOMY W/ WRIST ARTHRODESIS&ILIAC CREST BONE GRAFT      There were no vitals filed for this visit.  Subjective Assessment - 01/11/19  0913    Subjective  COVID 19 screening performed on patient upon arrival. Patient reports that she has had more pain today.    Pertinent History  AF, COPD, CVA, TIA, bilateral TKA, right RTC repair and right CTS.    Currently in Pain?  Yes    Pain Score  8     Pain Location  Neck    Pain Orientation  Left    Pain Type  Chronic pain    Pain Radiating Towards  LUE to wrist         [facility] PT Assessment - 01/11/19 0001      Assessment   Medical Diagnosis  Neck pain with radicular left arm pain.    Referring Provider (PT)  Cleta Alberts PA-C    Next MD Visit  12/2018      Restrictions   Weight Bearing Restrictions  No                   [facility] Adult PT Treatment/Exercise - 01/11/19 0001      Modalities   Modalities  Electrical Stimulation;Moist Heat;Ultrasound      Moist Heat Therapy   Number Minutes Moist Heat  15 Minutes    Moist Heat Location  Cervical      Electrical Stimulation   Electrical Stimulation Location  L UT    Electrical Stimulation Action  Pre-Mod    Electrical Stimulation Parameters  80-150 hz x15 min    Electrical Stimulation Goals  Tone;Pain      Ultrasound   Ultrasound Location  L UT    Ultrasound Parameters  1.5 w/cm2, 100%, 1 mhz x10 min    Ultrasound Goals  Pain      Manual Therapy   Manual Therapy  Soft tissue mobilization    Soft tissue mobilization  Very gentle STW to L UT, cervical paraspinals, levator scapula to reduce muscle guarding and pain                  PT Long Term Goals - 01/11/19 1006      PT LONG TERM GOAL #1   Title  Independent with a HEP.    Time  6    Period  Weeks    Status  On-going      PT LONG TERM GOAL #2   Title  Increase active cervical rotation to 60 degrees+ so patient can turn head more easily while driving.    Time  6    Period  Weeks    Status  On-going      PT LONG TERM GOAL #3   Title  Eliminate UE symptoms.  Time  6    Period  Weeks    Status  Not Met      PT LONG TERM GOAL #4    Title  Perform ADL's with pain not > 3/10.    Status  Not Met            Plan - 01/11/19 0953    Clinical Impression Statement  Patient presented in clinic with reports of increased L cervical and shoulder pain. Patient reports tightness and radiating cramping sensation down LUE at times which limits the use of her LUE. Palpable muscle guarding present in L UT and patient extremely sensitive initially to light manual therapy to reduce guarding and pain. Normal modalities response noted following removal of the modalities. Patient utilizes heating pad at home and educated by PTA to limit use to 15-20 minutes per session. Patient still greatly limited with ADLs due to pain and recieves only temporary relief from PT.    Personal Factors and Comorbidities  Comorbidity 1;Comorbidity 2    Comorbidities  AF, COPD, CVA, TIA, bilateral TKA, right RTC repair and right CTS.    Examination-Activity Limitations  Bathing    Stability/Clinical Decision Making  Evolving/Moderate complexity    Rehab Potential  Good    PT Frequency  2x / week    PT Duration  6 weeks    PT Treatment/Interventions  ADLs/Self Care Home Management;Electrical Stimulation;Cryotherapy;Moist Heat;Ultrasound;Therapeutic exercise;Therapeutic activities;Patient/family education;Manual techniques;Passive range of motion    PT Next Visit Plan  Continue at MD discretion.    Consulted and Agree with Plan of Care  Patient       Patient will benefit from skilled therapeutic intervention in order to improve the following deficits and impairments:  Pain, Postural dysfunction, Increased muscle spasms, Decreased activity tolerance, Decreased range of motion, Decreased strength  Visit Diagnosis:  Cervicalgia   Abnormal posture    Problem List Patient Active Problem List   Diagnosis Date Noted  . Stomatitis 07/03/2018  . TMJ (sprain of temporomandibular joint), initial encounter 07/03/2018  . Tremor, essential 03/20/2017  .  Syncope and collapse 03/20/2017  . AF (atrial fibrillation) ([place]) 08/03/2016  . Hypokalemia 02/25/2016  . Anxiety 02/24/2016  . Stroke-like symptoms 02/24/2016  . Weakness 01/26/2016  . Atrial fibrillation ([place]) 01/26/2016  . History of colon polyps 01/28/2015  . Dysphagia 01/28/2015  . History of esophageal stricture 01/28/2015  . Primary osteoarthritis of right knee 06/24/2014  . Acute systolic heart failure ([place]) 06/23/2014  . Takotsubo syndrome 06/21/2014  . Elevated troponin 06/20/2014  . Atrial fibrillation with RVR ([place]) 06/20/2014  . Congestive dilated cardiomyopathy ([place]) 06/20/2014  . Demand ischemia of myocardium ([place]) 06/20/2014  . 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 Respiratory failure requiring intubation ([place]) 06/19/2014  . Acute pulmonary edema ([place]) 06/19/2014  . Acute respiratory failure with hypoxia ([place]) 06/19/2014  . Hypoxemia   . Arterial hypotension   . Gait instability 10/26/2013  . Stroke ([place]) 10/26/2013  . TIA (transient ischemic attack) 10/26/2013  . Tobacco user, hx of 05/20/2013  . Postoperative anemia due to acute blood loss 01/18/2013  . Osteoarthritis of left knee 01/17/2013  . Essential hypertension 12/07/2012  . Peripheral edema 12/07/2012  . Diabetes mellitus with complication ([place]) 67/59/1638  . COPD mixed type ([place]) 08/05/2012  . Seasonal and perennial allergic rhinitis 08/05/2012  . Urticaria 08/05/2012  . Chest pain 02/06/2012  . Hyperlipidemia 02/06/2012    Standley Brooking, PTA 01/11/19 10:09 AM   [facility] [place], [place], [address] Phone: [phone]   Fax:  [phone]  Name: GIULIETTA PROKOP MRN: [id number] Date of Birth: [birth year]

## 2019-01-15 ENCOUNTER — Telehealth: Payer: Self-pay

## 2019-01-15 NOTE — Telephone Encounter (Signed)
   [facility] Pre-operative Risk Assessment    Request for surgical clearance:  1. What type of surgery is being performed? ACDF C5-6    2. When is this surgery scheduled? TBD   3. What type of clearance is required (medical clearance vs. Pharmacy clearance to hold med vs. Both)? Pharmacy  4. Are there any medications that need to be held prior to surgery and how long? Eliquis 5 days prior to surgery and 2 days after   5. Practice name and name of physician performing surgery? [facility]/ [facility]   6. What is your office phone number [phone]    7.   What is your office fax number [phone]  8.   Anesthesia type (None, local, MAC, general) ? General   [name] [name] 01/15/2019, 10:42 AM  _________________________________________________________________   (provider comments below)

## 2019-01-15 NOTE — Telephone Encounter (Signed)
OK to hold eliqius for 2 days prior to procedure and restart as soon as possible per surgery.

## 2019-01-15 NOTE — Telephone Encounter (Signed)
Need to clarify if pt is still taking anticoagulation. She does not have Eliquis on her medication list, it was discontinued for "completed course" by CMA on 05/09/18 during office visit that says pt should continue Eliquis.

## 2019-01-15 NOTE — Telephone Encounter (Signed)
   Primary Cardiologist: Mertie Moores, MD EP: Dr. Curt Bears  Chart reviewed as part of pre-operative protocol coverage. Patient was contacted 01/15/2019 in reference to pre-operative risk assessment for pending surgery as outlined below.  [name] [name] was last seen on 05/2018 by Dr. Curt Bears.  Since that day, [name] [name] has done well.  Therefore, based on ACC/AHA guidelines, the patient would be at acceptable risk for the planned procedure without further cardiovascular testing.   Patient is currently taking Eliquis 5mg  BID. Covering staff, please medications list.   Pharmacy/Dr. Curt Bears to review anticoagulation.    [place], [place] 01/15/2019, 2:50 PM

## 2019-01-15 NOTE — Telephone Encounter (Signed)
Agree with shorter hold than requested due to elevated cardiac risk - CHADS2VASc score is 61 (age, sex, CHF, HTN, DM, CAD, multiple strokes).

## 2019-01-18 ENCOUNTER — Ambulatory Visit: Payer: Self-pay | Admitting: Orthopedic Surgery

## 2019-01-24 ENCOUNTER — Encounter: Payer: Self-pay | Admitting: Internal Medicine

## 2019-01-24 ENCOUNTER — Ambulatory Visit (INDEPENDENT_AMBULATORY_CARE_PROVIDER_SITE_OTHER): Payer: Medicare Other | Admitting: Internal Medicine

## 2019-01-24 ENCOUNTER — Other Ambulatory Visit: Payer: Self-pay

## 2019-01-24 DIAGNOSIS — Z720 Tobacco use: Secondary | ICD-10-CM | POA: Diagnosis not present

## 2019-01-24 DIAGNOSIS — I4811 Longstanding persistent atrial fibrillation: Secondary | ICD-10-CM | POA: Diagnosis not present

## 2019-01-24 DIAGNOSIS — J449 Chronic obstructive pulmonary disease, unspecified: Secondary | ICD-10-CM

## 2019-01-24 MED ORDER — EPINEPHRINE 0.3 MG/0.3ML IJ SOAJ: INTRAMUSCULAR | 12 refills | Status: AC

## 2019-01-24 MED ORDER — ALBUTEROL SULFATE (2.5 MG/3ML) 0.083% IN NEBU: 2.5000 mg | INHALATION_SOLUTION | Freq: Four times a day (QID) | RESPIRATORY_TRACT | 12 refills | Status: AC | PRN

## 2019-01-24 NOTE — Patient Instructions (Signed)
Refills sent for nebulizer medicine and epipen  Please call if we can help

## 2019-01-24 NOTE — Progress Notes (Signed)
HPI female former smoker followed for chronic bronchitis, allergic rhinitis, complicated by DM 2, history CVA, A. Fib/ CM/CHF PFT 11/05/12 NPSG 05/17/16- WNL AHI 2.2/ hr Echocardiogram 08/10/2016 EF 43-32%, Gr 2 diastolic dysfunction Office Spirometry 11/07/2016-moderate obstructive airways disease. FVC 1.95/70%, FEV1 1.43/67%, ratio 0.73, FEF 25-75% 0.99/49% ------------------------------------------------------------------------------------------------------------  09/25/2018- 68 year old female former smoker followed for COPD, allergic rhinitis, complicated by DM 2, history CVA,, A. Fib/CM/CHF -----Just getting over the flu/Pneumonia. Still coughing up yellow mucus. There with husband who is oxygen dependent.  She hopes to avoid that for herself.  Reports flu complicated by pneumonia earlier this winter but now feels back to normal.  She is not sure Spiriva helps any.  Uses nebulized albuterol and occasional albuterol rescue inhaler if needed. We discussed suggestion of ILD on last imaging. CXR 1V- 03/24/18-  1. No active disease. No evidence of pneumonia or pulmonary edema. 2. Chronic interstitial prominence, suggesting some degree of chronic interstitial lung disease and/or chronic bronchitic change.  01/24/2019- 67 year old female smoker followed for COPD, allergic rhinitis, complicated by DM 2, history CVA,, A. Fib/CM/CHF, GERD/ Barrett, Bipolar -----pt reports having usual flare-up d/t pollen & weather changes Spiriva, ProAir hfa, Singulair, Astelin, neb albuterol Smoking again- blames family stresses.  Pending C-spine surgery then L shoulder repair. Asks refill Epipen- insect sting allergy CXR 09/25/2018-  IMPRESSION: Stable interstitial prominence without acute abnormality.  ROS-see HPI + = positive Constitutional:   No-   weight loss, night sweats, fevers, chills, fatigue, lassitude. HEENT:   +  headaches, difficulty swallowing, tooth/dental problems, sore throat,       No-  Sneezing,  itching, no-ear ache, +nasal congestion, +post nasal drip,  CV:  No-   chest pain, orthopnea, PND, swelling in lower extremities, anasarca, dizziness, +palpitations Resp: +  shortness of breath with exertion or at rest.              No-   productive cough,  + non-productive cough,  No- coughing up of blood.              No-   change in color of mucus.   wheezing.   Skin: No- rash or lesions. GI:  No- heartburn, indigestion, no-abdominal pain, nausea, vomiting,  GU: . MS:  + joint pain or swelling.   Neuro-     nothing unusual Psych:  No- change in mood or affect. + depression or anxiety.  No memory loss.  OBJ- Physical Exam General- Alert, Oriented, Affect-appropriate, Distress- none acute.  + Overweight Skin- + bruises on face from falling Lymphadenopathy- none Head- atraumatic            Eyes- Gross vision intact, PERRLA, conjunctivae and secretions clear            Ears- Hearing, canals-normal            Nose- Clear, no-Septal dev, mucus, polyps, erosion, perforation             Throat- Mallampati III-IV , mucosa clear , drainage- none, tonsils- atrophic, + dentures Neck- flexible , trachea midline, no stridor , thyroid nl, carotid no bruit Chest - symmetrical excursion , unlabored           Heart/CV- RRR c/w RSR today , no murmur , no gallop  , no rub, nl s1 s2                           - JVD- none , edema- none, stasis changes-  none, varices- none           Lung-  Cough-none , dullness-none, rub- none, wheeze-none           Chest wall-  Abd-  Br/ Gen/ Rectal- Not done, not indicated Extrem- cyanosis- none, clubbing, none, atrophy- none, strength- nl Neuro- grossly intact to observation

## 2019-01-25 ENCOUNTER — Ambulatory Visit: Payer: Self-pay | Admitting: Orthopedic Surgery

## 2019-01-25 NOTE — H&P (Deleted)
  The note originally documented on this encounter has been moved the the encounter in which it belongs.  

## 2019-01-25 NOTE — H&P (Signed)
Subjective:   [name] [name] is a pleasant 68 year old female who was in her normal state of health until approximately 2 month ago she woke up with neck pain and radicular left arm pain. She states the pain is an 8/10 she describes it as stabbing, sharp, and worsening over the past month. The pain is improved with narcotics and gabapentin, it is worse with turning her head specifically to the left. She notes that she has been dropping things more often. However she denies any true weakness of her arm. No issues with fine motor movement such as buttoning or writing. No new changes in her balance. She has not had surgery or neck issues in the past. Patient is diabetic last A1c was 6.2. Unfortunately, she is allergic to cortisone/prednisone.  We did trial 4 weeks of increased gabapentin 300 mg in morning and noon, 600 at night, Hydrocodone PRN, and Physical therapy; however, the patient failed to improve with these conservative measures and her quality of life continues to deteriorate therefore, she would like to move forward with surgical intervention.   Patient Active Problem List   Diagnosis Date Noted  . Stomatitis 07/03/2018  . TMJ (sprain of temporomandibular joint), initial encounter 07/03/2018  . Tremor, essential 03/20/2017  . Syncope and collapse 03/20/2017  . AF (atrial fibrillation) ([place]) 08/03/2016  . Hypokalemia 02/25/2016  . Anxiety 02/24/2016  . Stroke-like symptoms 02/24/2016  . Weakness 01/26/2016  . Atrial fibrillation ([place]) 01/26/2016  . History of colon polyps 01/28/2015  . Dysphagia 01/28/2015  . History of esophageal stricture 01/28/2015  . Primary osteoarthritis of right knee 06/24/2014  . Acute systolic heart failure ([place]) 06/23/2014  . Takotsubo syndrome 06/21/2014  . Elevated troponin 06/20/2014  . Atrial fibrillation with RVR ([place]) 06/20/2014  . Congestive dilated cardiomyopathy ([place]) 06/20/2014  . Demand ischemia of myocardium ([place]) 06/20/2014  . Status post total  right knee replacement 06/19/2014  . Respiratory failure requiring intubation ([place]) 06/19/2014  . Acute pulmonary edema ([place]) 06/19/2014  . Acute respiratory failure with hypoxia ([place]) 06/19/2014  . Hypoxemia   . Arterial hypotension   . Gait instability 10/26/2013  . Stroke ([place]) 10/26/2013  . TIA (transient ischemic attack) 10/26/2013  . Tobacco user, hx of 05/20/2013  . Postoperative anemia due to acute blood loss 01/18/2013  . Osteoarthritis of left knee 01/17/2013  . Essential hypertension 12/07/2012  . Peripheral edema 12/07/2012  . Diabetes mellitus with complication ([place]) 60/05/9322  . COPD mixed type ([place]) 08/05/2012  . Seasonal and perennial allergic rhinitis 08/05/2012  . Urticaria 08/05/2012  . Chest pain 02/06/2012  . Hyperlipidemia 02/06/2012   Past Medical History:  Diagnosis Date  . Anemia    hx of years ago   . Anxiety   . Aortic valve disorders   . Arthritis   . Asthma   . Barrett esophagus   . Bipolar disorder ([place])   . CHF (congestive heart failure) ([place])   . Chronic bronchitis ([place])   . Chronic kidney disease    "I don't have but 1" (08/03/2016)  . COPD (chronic obstructive pulmonary disease) ([place])    "just have a touch" (08/03/2016)  . Depression   . Esophageal reflux   . Esophageal stricture   . Fibromyalgia    "hands, knees, elbows; back; think it's in my hips" (08/03/2016)  . GAD (generalized anxiety disorder)   . GERD (gastroesophageal reflux disease)   . Hiatal hernia   . History of blood transfusion 1970s; 01/08/2013   "low blood count"  .  History of kidney stones 01-08-13   past hx.  . History of nuclear stress test 01/2016   low risk study  . History of stomach ulcers    "some have bleed"  . Hyperlipemia   . Hypertension 12/07/2012  . Insomnia, unspecified   . Myocardial infarction [facility]) 01-08-13   '06-Chest pain-no stent-dx. MI-stress related  . Osteoporosis   . Personal history of colonic polyps 09/2010   TUBULAR ADENOMAS (X3); NEGATIVE  FOR HIGH GRADE DYSPLASIA OR MALIGNANCY.  [name] Pneumonia    "several times" (08/03/2016)  . Stroke [facility])    "2 or 3"; remains with some right sided weaknes (08/03/2016)  . Takotsubo syndrome   . Tremor, essential 03/20/2017  . Type II diabetes mellitus ([place])   . Urinary frequency    AND INCONTINENCE    Past Surgical History:  Procedure Laterality Date  . ATRIAL FIBRILLATION ABLATION  08/03/2016  . CARDIAC CATHETERIZATION     normal per Dr Acie Fredrickson in note dated 02/06/12   . CARDIAC CATHETERIZATION     "I've had 2 or 3" (08/03/2016)  . CARPAL TUNNEL RELEASE Right   . CATARACT EXTRACTION W/ INTRAOCULAR LENS  IMPLANT, BILATERAL    . COLONOSCOPY W/ BIOPSIES AND POLYPECTOMY    . DILATION AND CURETTAGE OF UTERUS     S/P miscarriage  . ELECTROPHYSIOLOGIC STUDY N/A 08/03/2016   Procedure: Atrial Fibrillation Ablation;  Surgeon: Will Meredith Leeds, MD;  Location: [place] [facility];  Service: Cardiovascular;  Laterality: N/A;  . ESOPHAGEAL DILATION  01-08-13   2 yrs ago  . HEEL SPUR SURGERY Left 2005  . KNEE ARTHROSCOPY  02/28/2012   Procedure: ARTHROSCOPY KNEE;  Surgeon: Tobi Bastos, MD;  Location: [facility];  Service: Orthopedics;  Laterality: Left;  . LAPAROSCOPIC CHOLECYSTECTOMY  03/2002  . SHOULDER OPEN ROTATOR CUFF REPAIR Right 2011  . TONSILLECTOMY AND ADENOIDECTOMY  1965  . TOTAL KNEE ARTHROPLASTY Left 01/17/2013   Procedure: LEFT TOTAL KNEE ARTHROPLASTY;  Surgeon: Tobi Bastos, MD;  Location: [facility];  Service: Orthopedics;  Laterality: Left;  . TOTAL KNEE ARTHROPLASTY Right 06/19/2014   Procedure: RIGHT TOTAL KNEE ARTHROPLASTY;  Surgeon: Tobi Bastos, MD;  Location: [facility];  Service: Orthopedics;  Laterality: Right;  . TUBAL LIGATION    . VAGINAL HYSTERECTOMY     "partial"  . WRIST FLEXION TENDON TENOTOMIES AND PROXIMAL CORPECTOMY W/ WRIST ARTHRODESIS&ILIAC CREST BONE GRAFT      Current Outpatient Medications  Medication Sig Dispense Refill Last Dose  . acetaminophen (TYLENOL) 325  MG tablet Take 1-2 tablets (325-650 mg total) by mouth every 4 (four) hours as needed for mild pain.   Taking  . albuterol (PROVENTIL) (2.5 MG/3ML) 0.083% nebulizer solution Take 3 mLs (2.5 mg total) by nebulization every 6 (six) hours as needed for wheezing. DX  491.9, J44.9 360 mL 12   . ALPRAZolam (XANAX) 0.5 MG tablet Take 1 mg by mouth 3 (three) times daily as needed for anxiety or sleep. 6/25 pt reports PCP increasing dose to 1 mg 3 times daily PRN   Taking  . apixaban (ELIQUIS) 5 MG TABS tablet Take 5 mg by mouth 2 (two) times daily.   Taking  . atorvastatin (LIPITOR) 80 MG tablet Take 80 mg by mouth daily.   Taking  . azelastine (ASTELIN) 0.1 % nasal spray Place 1 spray into both nostrils at bedtime.    Taking  . beta carotene 25000 UNIT capsule Take 25,000 Units by mouth daily.   Taking  .  cholecalciferol (VITAMIN D) 1000 UNITS tablet Take 1,000 Units by mouth daily.    Taking  . dexlansoprazole (DEXILANT) 60 MG capsule Take 60 mg by mouth daily.   Taking  . docusate sodium (COLACE) 100 MG capsule Take 100 mg by mouth 2 (two) times daily.   Taking  . EPINEPHrine (EPIPEN 2-PAK) 0.3 mg/0.3 mL IJ SOAJ injection USE AS DIRECTED 2 each 12   . Flaxseed, Linseed, (FLAXSEED OIL) 1200 MG CAPS Take 1,200 mg by mouth at bedtime.   Taking  . furosemide (LASIX) 20 MG tablet Take 1 tablet (20 mg total) by mouth daily. 90 tablet 2 Taking  . gabapentin (NEURONTIN) 300 MG capsule Take 300-600 mg by mouth See admin instructions. Take 300 mg by mouth in the morning and afternoon and 600 mg at bedtime   Taking  . glipiZIDE (GLUCOTROL) 5 MG tablet Take 5 mg by mouth 2 (two) times daily before a meal.   Taking  . HYDROcodone-acetaminophen (NORCO/VICODIN) 5-325 MG tablet Take 1 tablet by mouth every 8 (eight) hours as needed for pain.   Taking  . isosorbide mononitrate (IMDUR) 30 MG 24 hr tablet Take 1 tablet (30 mg total) by mouth daily. 90 tablet 2 Taking  . losartan (COZAAR) 50 MG tablet TAKE 1 TABLET BY MOUTH  EVERY DAY (Patient taking differently: Take 50 mg by mouth daily. ) 90 tablet 0 Taking  . montelukast (SINGULAIR) 10 MG tablet Take 1 tablet (10 mg total) by mouth at bedtime. 30 tablet 11 Taking  . PARoxetine (PAXIL) 30 MG tablet Take 30 mg by mouth at bedtime.   1 Taking  . PROAIR HFA 108 (90 Base) MCG/ACT inhaler INHALE TWO PUFFS INTO THE LUNGS EVERY 6 HOURS AS NEEDED FOR WHEEZING OR SHORTNESS OF BREATH (Patient taking differently: Inhale 2 puffs into the lungs every 6 (six) hours as needed for wheezing or shortness of breath. ) 8.5 g 11 Taking  . SPIRIVA HANDIHALER 18 MCG inhalation capsule PLACE ONE CAPSULE IN INHALER AND INHALE DAILY (Patient taking differently: Place 18 mcg into inhaler and inhale daily. ) 30 capsule 6 Taking  . vitamin B-12 (CYANOCOBALAMIN) 1000 MCG tablet Take 1,000 mcg by mouth daily.    Taking   No current facility-administered medications for this visit.    Facility-Administered Medications Ordered in Other Visits  Medication Dose Route Frequency Provider Last Rate Last Dose  . tranexamic acid (CYKLOKAPRON) 2,000 mg in sodium chloride 0.9 % 50 mL Topical Application  1,610 mg Topical Once Cecilio Asper, [employer], PA-C       Allergies  Allergen Reactions  . Bee Venom Anaphylaxis  . Ceclor [Cefaclor] Other (See Comments)    Reaction=burning all over  . Penicillins Anaphylaxis    "CLOSES OFF MY BREATHING" Has patient had a PCN reaction causing immediate rash, facial/tongue/throat swelling, SOB or lightheadedness with hypotension: Yes Has patient had a PCN reaction causing severe rash involving mucus membranes or skin necrosis: No Has patient had a PCN reaction that required hospitalization No Has patient had a PCN reaction occurring within the last 10 years: No If all of the above answers are "NO", then may proceed with Cephalosporin use.   . Pneumococcal Vaccines Other (See Comments)    PP-23 vaccine; had BIG local red reaction with heat.   . Codeine Nausea And Vomiting   . Cortisone Hives    All over body  . Boniva [Ibandronate Sodium] Other (See Comments)    Jaw popping    Social History   Tobacco  Use  . Smoking status: Current Every Day Smoker    Packs/day: 0.50    Years: 50.00    Pack years: 25.00    Types: Cigarettes    Last attempt to quit: 01/08/2016    Years since quitting: 3.0  . Smokeless tobacco: Former Systems developer    Types: Snuff, Chew    Quit date: 12/31/2011  . Tobacco comment: 01/24/2019 pt reports smoking 6-7 cigs some days and 1 pack other days  Substance Use Topics  . Alcohol use: Yes    Comment: 08/03/2016 "maybe 1 beer/month; a shot of rum" maybe twice/year"    Family History  Problem Relation Age of Onset  . Breast cancer Mother   . Stroke Mother   . Transient ischemic attack Mother   . Colon cancer Brother 87  . Colon polyps Brother   . Heart failure Brother   . Heart attack Father   . Heart failure Father   . Diabetes Maternal Grandfather   . Kidney disease Other        Both sides of family  . Heart disease Other        Both sides of family  . Breast cancer Maternal Aunt   . Breast cancer Maternal Grandmother     Review of Systems As stated in HPI  Objective:   General: AAOX3, well developed and well nourished, NAD Ambulation: normal gait pattern, uses no assistive device. Inspection: No obvious deformity, scoleosis, kyphosis, loss of lordotic curve. Heart: RRR, no rubs, murmers, or gallops Lungs: CTAB Abdome: Normal BSX4, non-tender, non-distended, no hepatosplenomegaly.  Palpation: Tender over spinous processes and neck musculature.   AROM: - Fwd Flexion: Normal and pain free - Extension: Normal and pain free - Lateral bending to left: Normal and pain free - Lateral Bending to right: Normal and pain free - Rotation to Left: Normal and pain free - Rotation to Right: Normal and pain free -Shoulder, elbow, and wrists AROM normal and pain free.   Dermatomes: UE dermatomes abnormal to light touch left C5  dermatome  Myotomes:  - shoulder shrug: Left 5/5, Right 5/5 -Shoulder Abduction: Left 4/5, Right 5/5 - Elbow flexion: Left 4/5, Right 5/5 - Elbow extension Left 5/5, Right 5/5 - Wrist extension Left 4/5, right 5/5 - Finger abduction: Left 5/5, Right 5/5 - finger Adduction/squeeze: Left 5/5, Right 5/5    Reflexes:  - Biceps: Left2+, Right 2+ - Brachioradialius: Left2+, Right 2+ - Triceps: Left 2+, Right 2+ - Hoffman's: Negative   Special Tests: - UE Neural tension test: Left Positive, Right Negative  PV: Extremities warm and well profused. Distal pulses 2+ bilaterally.    X-Ray impression No signs of fracture or significant slip. There is degenerative disc disease noted at C5-6 and 6 7 more significantly.  MRI Impression: MRI confirms a left hemicord ventral abundant by a small to moderate volume disc extrusion with several rolled millimeter ellipse of caudal migration with a deformity of the left hemicord. Stenosis of the inferior aspect of the left foramen with a patent superior aspect of the left and patent right foramen at the C5-6 level. At the C6-7 level there is a central right paracentral broad-based small disc protrusion with mild central stenosis and mild left and minimal right foraminal stenosis  Assessment:   Maryella is a very pleasant 68 year old woman with severe neck and radicular left arm pain. Clinical exam demonstrates 4 out of 5 left bicep strength and wrist extensor strength, as well as severe radicular pain and numbness  and dysesthesias in the left C6 dermatome. She has a positive Spurling sign on the left side. Remainder of her neurological exam demonstrates no focal deficits. Negative Babinski test, negative Hoffman test. MRI of the cervical spine demonstrates a large posterior lateral left disc herniation at C5-6 with compression of the C6 nerve root. There is a small right-sided protrusion at C6-7 with minimal compression to the exiting C7 nerve  root.  Patient's clinical exam and MRI findings are consistent with left C6 radiculopathy with both motor and sensory deficits. At this point she has tried physical therapy but it is only exacerbated her symptoms and so she had to stop. Patient cannot tolerate steroids and so selective nerve root blocks are not an option. At this point the pain is so severe and she has progressive neurological deficits and some recommending moving forward with a single level ACDF. We did discuss how in the future the level below can breakdown becomes symptomatic but at this point she is having no right radicular arm pain and she has no focal neurological deficits attributed to the right C7 nerve root. Given the fact that she has no clinical symptoms I am recommending only doing the clinically symptomatic C5-6 level. I have counseled her on the need to stop smoking and the adverse effects of smoking can have on the healing process. She has agreed to stop smoking.  Plan:   I have gone over the surgical procedure which will be an ACDF at C5-6. I have addressed all the risks and benefits with her and her husband.  Risks and benefits of surgery were discussed with the patient. These include: Infection, bleeding, death, stroke, paralysis, ongoing or worse pain, need for additional surgery, nonunion, leak of spinal fluid, adjacent segment degeneration requiring additional fusion surgery. Pseudoarthrosis (nonunion)requiring supplemental posterior fixation. Throat pain, swallowing difficulties, hoarseness or change in voice.   Treatment plan: We will obtain clearance from her primary care physician and move forward with surgery in the near future.

## 2019-01-26 NOTE — Assessment & Plan Note (Signed)
Disappointing relapse associated with significant family stress. May need behavioral help and counseling. I pressed her to begin weaning back down and away from smoking

## 2019-01-26 NOTE — Assessment & Plan Note (Signed)
Either very well controlled or in RSR at this visit. Followed by cardiology

## 2019-01-26 NOTE — Assessment & Plan Note (Addendum)
Near baseline despite smoking. Meds discussed. Plan- refill neb solution

## 2019-01-28 ENCOUNTER — Other Ambulatory Visit (HOSPITAL_COMMUNITY)
Admission: RE | Admit: 2019-01-28 | Discharge: 2019-01-28 | Disposition: A | Discharge status: Home / Self Care | Payer: Medicare Other | Source: Ambulatory Visit | Attending: Orthopedic Surgery | Admitting: Orthopedic Surgery

## 2019-01-28 ENCOUNTER — Encounter (HOSPITAL_COMMUNITY)
Admission: RE | Admit: 2019-01-28 | Discharge: 2019-01-28 | Disposition: A | Discharge status: Home / Self Care | Payer: Medicare Other | Source: Ambulatory Visit | Attending: Orthopedic Surgery | Admitting: Orthopedic Surgery

## 2019-01-28 ENCOUNTER — Other Ambulatory Visit: Payer: Self-pay

## 2019-01-28 ENCOUNTER — Encounter (HOSPITAL_COMMUNITY): Payer: Self-pay

## 2019-01-28 DIAGNOSIS — M50122 Cervical disc disorder at C5-C6 level with radiculopathy: Secondary | ICD-10-CM | POA: Diagnosis not present

## 2019-01-28 DIAGNOSIS — I252 Old myocardial infarction: Secondary | ICD-10-CM | POA: Diagnosis not present

## 2019-01-28 DIAGNOSIS — Z1159 Encounter for screening for other viral diseases: Secondary | ICD-10-CM | POA: Diagnosis not present

## 2019-01-28 DIAGNOSIS — Z7984 Long term (current) use of oral hypoglycemic drugs: Secondary | ICD-10-CM | POA: Diagnosis not present

## 2019-01-28 DIAGNOSIS — I130 Hypertensive heart and chronic kidney disease with heart failure and stage 1 through stage 4 chronic kidney disease, or unspecified chronic kidney disease: Secondary | ICD-10-CM | POA: Diagnosis not present

## 2019-01-28 DIAGNOSIS — I5021 Acute systolic (congestive) heart failure: Secondary | ICD-10-CM | POA: Diagnosis not present

## 2019-01-28 DIAGNOSIS — E785 Hyperlipidemia, unspecified: Secondary | ICD-10-CM | POA: Diagnosis not present

## 2019-01-28 DIAGNOSIS — N189 Chronic kidney disease, unspecified: Secondary | ICD-10-CM | POA: Diagnosis not present

## 2019-01-28 DIAGNOSIS — Z7901 Long term (current) use of anticoagulants: Secondary | ICD-10-CM | POA: Diagnosis not present

## 2019-01-28 DIAGNOSIS — I69351 Hemiplegia and hemiparesis following cerebral infarction affecting right dominant side: Secondary | ICD-10-CM | POA: Diagnosis not present

## 2019-01-28 DIAGNOSIS — Z96653 Presence of artificial knee joint, bilateral: Secondary | ICD-10-CM | POA: Diagnosis not present

## 2019-01-28 DIAGNOSIS — J449 Chronic obstructive pulmonary disease, unspecified: Secondary | ICD-10-CM | POA: Diagnosis not present

## 2019-01-28 DIAGNOSIS — K219 Gastro-esophageal reflux disease without esophagitis: Secondary | ICD-10-CM | POA: Diagnosis not present

## 2019-01-28 DIAGNOSIS — F17210 Nicotine dependence, cigarettes, uncomplicated: Secondary | ICD-10-CM | POA: Diagnosis not present

## 2019-01-28 DIAGNOSIS — Z79899 Other long term (current) drug therapy: Secondary | ICD-10-CM | POA: Diagnosis not present

## 2019-01-28 DIAGNOSIS — E1122 Type 2 diabetes mellitus with diabetic chronic kidney disease: Secondary | ICD-10-CM | POA: Diagnosis not present

## 2019-01-28 DIAGNOSIS — F329 Major depressive disorder, single episode, unspecified: Secondary | ICD-10-CM | POA: Diagnosis not present

## 2019-01-28 DIAGNOSIS — F411 Generalized anxiety disorder: Secondary | ICD-10-CM | POA: Diagnosis not present

## 2019-01-28 DIAGNOSIS — M542 Cervicalgia: Secondary | ICD-10-CM | POA: Diagnosis present

## 2019-01-28 DIAGNOSIS — M2578 Osteophyte, vertebrae: Secondary | ICD-10-CM | POA: Diagnosis not present

## 2019-01-28 DIAGNOSIS — I4891 Unspecified atrial fibrillation: Secondary | ICD-10-CM | POA: Diagnosis not present

## 2019-01-28 LAB — CBC
HCT: 37.5 % (ref 36.0–46.0)
Hemoglobin: 12.4 g/dL (ref 12.0–15.0)
MCH: 31.6 pg (ref 26.0–34.0)
MCHC: 33.1 g/dL (ref 30.0–36.0)
MCV: 95.4 fL (ref 80.0–100.0)
Platelets: 206 10*3/uL (ref 150–400)
RBC: 3.93 MIL/uL (ref 3.87–5.11)
RDW: 14.1 % (ref 11.5–15.5)
WBC: 6.1 10*3/uL (ref 4.0–10.5)
nRBC: 0 % (ref 0.0–0.2)

## 2019-01-28 LAB — BASIC METABOLIC PANEL
Anion gap: 8 (ref 5–15)
BUN: <5 mg/dL — ABNORMAL LOW (ref 8–23)
CO2: 28 mmol/L (ref 22–32)
Calcium: 9.1 mg/dL (ref 8.9–10.3)
Chloride: 105 mmol/L (ref 98–111)
Creatinine, Ser: 0.61 mg/dL (ref 0.44–1.00)
GFR calc Af Amer: >60 mL/min (ref >60)
GFR calc non Af Amer: >60 mL/min (ref >60)
Glucose, Bld: 175 mg/dL — ABNORMAL HIGH (ref 70–99)
Potassium: 3.9 mmol/L (ref 3.5–5.1)
Sodium: 141 mmol/L (ref 135–145)

## 2019-01-28 LAB — HEMOGLOBIN A1C
Hgb A1c MFr Bld: 8.2 % — ABNORMAL HIGH (ref 4.8–5.6)
Mean Plasma Glucose: 188.64 mg/dL

## 2019-01-28 LAB — URINALYSIS, ROUTINE W REFLEX MICROSCOPIC
Bilirubin Urine: NEGATIVE
Glucose, UA: 150 mg/dL — AB
Hgb urine dipstick: NEGATIVE
Ketones, ur: NEGATIVE mg/dL
Leukocytes,Ua: NEGATIVE
Nitrite: NEGATIVE
Protein, ur: NEGATIVE mg/dL
Specific Gravity, Urine: 1.002 — ABNORMAL LOW (ref 1.005–1.030)
pH: 7 (ref 5.0–8.0)

## 2019-01-28 LAB — SARS CORONAVIRUS 2 (TAT 6-24 HRS): SARS Coronavirus 2: NEGATIVE

## 2019-01-28 LAB — GLUCOSE, CAPILLARY: Glucose-Capillary: 213 mg/dL — ABNORMAL HIGH (ref 70–99)

## 2019-01-28 LAB — SURGICAL PCR SCREEN
MRSA, PCR: NEGATIVE
Staphylococcus aureus: NEGATIVE

## 2019-01-28 NOTE — Progress Notes (Signed)
PCP - Dr. Velna Hatchet Cardiologist - Dr. Curt Bears  Chest x-ray - 09/25/18 EKG - 03/26/18 Stress Test - 05/25/18 ECHO - 03/16/18 Cardiac Cath - 2013  Sleep Study - negative sleep study in the past.  CPAP - denies  Fasting Blood Sugar - 118 Checks Blood Sugar ___1__ times a day CBG at PAT 213 Will obtain A1C today.   Blood Thinner Instructions:Eliquis, per Dr. Curt Bears, hold 2 days prior to procedure. LD 01/28/19 Aspirin Instructions:N/A  Anesthesia review: Yes, per order.   Patient denies shortness of breath, fever, cough and chest pain at PAT appointment   Patient verbalized understanding of instructions that were given to them at the PAT appointment. Patient was also instructed that they will need to review over the PAT instructions again at home before surgery.  Pt to have covid testing today after PAT appointment. Pt made aware of quarantining after screening. Pt verbalizes agreement.    Coronavirus Screening  Have you experienced the following symptoms:  Cough yes/no: No Fever (>100.93F)  yes/no: No Runny nose yes/no: No Sore throat yes/no: No Difficulty breathing/shortness of breath  yes/no: No  Have you or a family member traveled in the last 14 days and where? yes/no: No   If the patient indicates "YES" to the above questions, their PAT will be rescheduled to limit the exposure to others and, the surgeon will be notified. THE PATIENT WILL NEED TO BE ASYMPTOMATIC FOR 14 DAYS.   If the patient is not experiencing any of these symptoms, the PAT nurse will instruct them to NOT bring anyone with them to their appointment since they may have these symptoms or traveled as well.   Please remind your patients and families that hospital visitation restrictions are in effect and the importance of the restrictions.

## 2019-01-28 NOTE — Pre-Procedure Instructions (Signed)
[id number] - [place], [place] [address] [place] [address] Phone: [phone] Fax: [phone]  [place], [place] [address] [place] [address] Phone: [phone] Fax: [phone]  [place], [place] [address]. Melina Modena [address] Dr. Fredderick Severance [place] [address] Phone: [phone] Fax: [phone]      Your procedure is scheduled on Thursday, July 2nd.  Report to [facility] Main Entrance "A" at 5:30 A.M., and check in at the Admitting office.  Call this number if you have problems the morning of surgery:  [phone]  Call [phone] if you have any questions prior to your surgery date Monday-Friday 8am-4pm    Remember:  Do not eat or drink after midnight.   Take these medicines the morning of surgery with A SIP OF WATER  acetaminophen (TYLENOL)-as needed for pain Nebulizer-as needed for wheezing ALPRAZolam (XANAX)-as needed dexlansoprazole (DEXILANT)  gabapentin (NEURONTIN) HYDROcodone-acetaminophen (NORCO/VICODIN)-as needed for pain isosorbide mononitrate (IMDUR) Inhalers-please bring your rescue inhaler with you to the hospital.   Per your Cardiologist, please hold apixaban (ELIQUIS) 2 days prior to procedure. Last dose, 01/28/19.    As of today, STOP taking any Aspirin (unless otherwise instructed by your surgeon), Aleve, Naproxen, Ibuprofen, Motrin, Advil, Goody's, BC's, all herbal medications, fish oil, and all vitamins.   WHAT DO I DO ABOUT MY DIABETES MEDICATION?   [name] Do not take oral diabetes medicines (pills) the morning of surgery. glipiZIDE (GLUCOTROL)   . THE NIGHT BEFORE SURGERY,  Do not take glipiZIDE (GLUCOTROL).       How to Manage Your Diabetes Before and After Surgery  Why is it important to control my blood sugar before and after surgery? . Improving blood sugar levels before and after surgery helps healing and can  limit problems. . A way of improving blood sugar control is eating a healthy diet by: o  Eating less sugar and carbohydrates o  Increasing activity/exercise o  Talking with your doctor about reaching your blood sugar goals . High blood sugars (greater than 180 mg/dL) can raise your risk of infections and slow your recovery, so you will need to focus on controlling your diabetes during the weeks before surgery. . Make sure that the doctor who takes care of your diabetes knows about your planned surgery including the date and location.  How do I manage my blood sugar before surgery? . Check your blood sugar at least 4 times a day, starting 2 days before surgery, to make sure that the level is not too high or low. o Check your blood sugar the morning of your surgery when you wake up and every 2 hours until you get to the Short Stay unit. . If your blood sugar is less than 70 mg/dL, you will need to treat for low blood sugar: o Do not take insulin. o Treat a low blood sugar (less than 70 mg/dL) with  cup of clear juice (cranberry or apple), 4 glucose tablets, OR glucose gel. o Recheck blood sugar in 15 minutes after treatment (to make sure it is greater than 70 mg/dL). If your blood sugar is not greater than 70 mg/dL on recheck, call [phone] for further instructions. . Report your blood sugar to the short stay nurse when you get to Short Stay.  . If you are admitted to the hospital after surgery: o Your blood sugar will be checked  by the staff and you will probably be given insulin after surgery (instead of oral diabetes medicines) to make sure you have good blood sugar levels. o The goal for blood sugar control after surgery is 80-180 mg/dL.     The Morning of Surgery  Do not wear jewelry, make-up or nail polish.  Do not wear lotions, powders, or perfumes, or deodorant  Do not shave 48 hours prior to surgery.   Do not bring valuables to the hospital.  [facility] is not responsible for  any belongings or valuables.  If you are a smoker, DO NOT Smoke 24 hours prior to surgery IF you wear a CPAP at night please bring your mask, tubing, and machine the morning of surgery   Remember that you must have someone to transport you home after your surgery, and remain with you for 24 hours if you are discharged the same day.   Contacts, glasses, hearing aids, dentures or bridgework may not be worn into surgery.    Leave your suitcase in the car.  After surgery it may be brought to your room.  For patients admitted to the hospital, discharge time will be determined by your treatment team.  Patients discharged the day of surgery will not be allowed to drive home.    Special instructions:   - Preparing For Surgery  Before surgery, you can play an important role. Because skin is not sterile, your skin needs to be as free of germs as possible. You can reduce the number of germs on your skin by washing with CHG (chlorahexidine gluconate) Soap before surgery.  CHG is an antiseptic cleaner which kills germs and bonds with the skin to continue killing germs even after washing.    Oral Hygiene is also important to reduce your risk of infection.  Remember - BRUSH YOUR TEETH THE MORNING OF SURGERY WITH YOUR REGULAR TOOTHPASTE  Please do not use if you have an allergy to CHG or antibacterial soaps. If your skin becomes reddened/irritated stop using the CHG.  Do not shave (including legs and underarms) for at least 48 hours prior to first CHG shower. It is OK to shave your face.  Please follow these instructions carefully.   1. Shower the NIGHT BEFORE SURGERY and the MORNING OF SURGERY with CHG Soap.   2. If you chose to wash your hair, wash your hair first as usual with your normal shampoo.  3. After you shampoo, rinse your hair and body thoroughly to remove the shampoo.  4. Use CHG as you would any other liquid soap. You can apply CHG directly to the skin and wash gently  with a scrungie or a clean washcloth.   5. Apply the CHG Soap to your body ONLY FROM THE NECK DOWN.  Do not use on open wounds or open sores. Avoid contact with your eyes, ears, mouth and genitals (private parts). Wash Face and genitals (private parts)  with your normal soap.   6. Wash thoroughly, paying special attention to the area where your surgery will be performed.  7. Thoroughly rinse your body with warm water from the neck down.  8. DO NOT shower/wash with your normal soap after using and rinsing off the CHG Soap.  9. Pat yourself dry with a CLEAN TOWEL.  10. Wear CLEAN PAJAMAS to bed the night before surgery, wear comfortable clothes the morning of surgery  11. Place CLEAN SHEETS on your bed the night of your first shower and DO NOT SLEEP  WITH PETS.    Day of Surgery:  Do not apply any deodorants/lotions.  Please wear clean clothes to the hospital/surgery center.   Remember to brush your teeth WITH YOUR REGULAR TOOTHPASTE.   Please read over the following fact sheets that you were given.

## 2019-01-29 NOTE — Progress Notes (Signed)
Anesthesia Chart Review:  Case: [id number] Date/Time: 01/31/19 0715   Procedure: ANTERIOR CERVICAL DECOMPRESSION/DISCECTOMY FUSION C5-6 (N/A ) - 3 hrs   Anesthesia type: General   Pre-op diagnosis: Herniated cervical disc, cervical radiculopathy   [facility] ROOM 04 / [facility]   Surgeon: Melina Schools, MD      DISCUSSION: 68 year old female former smoker followed for COPD, allergic rhinitis, DMII, history CVA, paroxysmal afib (s/p ablation 2018), CM/CHF.  Follows with cardiology for Hx paroxysmal atrial fibrillaiton, Takotsubo, HLD. Her EF has normalized from a low of 15-20%, now 55% by echo 2018. Cleared for surgery per telephone encounter by Leanor Kail, [place] 01/15/19.   Follows with Dr. Annamaria Boots for chronic bronchitis/COPD. Spirometry 11/07/2016-moderate obstructive airways disease. FVC 1.95/70%, FEV1 1.43/67%, ratio 0.73, FEF 25-75% 0.99/49%. Last seen 01/26/19, per OV note her COPD was near baseline despite having restarted smoking. Noted that pt was pending spine and shoulder surgery.  Anticipate she can proceed as planned barring acute status change.    VS: BP (!) 152/71   Pulse 70   Temp 37.1 C (Oral)   Resp 18   Ht 5\' 1"  (1.549 m)   Wt 73.2 kg   SpO2 99%   BMI 30.50 kg/m   PROVIDERS: Velna Hatchet, MD is PCP  Curt Bears, Will, MD is Cardiologist  Baird Lyons, MD is Pulmonologist  LABS: Labs reviewed: Acceptable for surgery. Poor DMII control. Dr. Rolena Infante notified.  (all labs ordered are listed, but only abnormal results are displayed)  Labs Reviewed  GLUCOSE, CAPILLARY - Abnormal; Notable for the following components:      Result Value   Glucose-Capillary 213 (*)    All other components within normal limits  BASIC METABOLIC PANEL - Abnormal; Notable for the following components:   Glucose, Bld 175 (*)    BUN <5 (*)    All other components within normal limits  URINALYSIS, ROUTINE W REFLEX MICROSCOPIC - Abnormal; Notable for the following components:   Color,  Urine STRAW (*)    Specific Gravity, Urine 1.002 (*)    Glucose, UA 150 (*)    All other components within normal limits  HEMOGLOBIN A1C - Abnormal; Notable for the following components:   Hgb A1c MFr Bld 8.2 (*)    All other components within normal limits  SURGICAL PCR SCREEN  CBC     IMAGES: CHEST - 2 VIEW 09/25/18:  COMPARISON:  03/24/2018  FINDINGS: Cardiac shadow is stable. Mild aortic calcifications are again seen. Stable chronic interstitial prominence is noted a similar to that seen on the prior exam. No focal infiltrate or sizable effusion is noted. No bony abnormality is seen.  IMPRESSION: Stable interstitial prominence without acute abnormality.   EKG: 03/24/18: Sinus rhythm. Rate 55. Atrial premature complexes. Low voltage, precordial leads. Anteroseptal infarct, old. Borderline T wave abnormalities. Baseline wander. When compared with ECG of 09/05/2016. No significant change was found  CV: Event monitor 05/25/18: Sinus rhythm with sinus tachycardia.   Patient triggers were associated with sinus rhythm. Short runs of SVT, maximum 20 beats without symptoms   Nuclear stress 05/15/18:  Nuclear stress EF: 67%.  Normal perfusion  This is a low risk study.  TTE 03/02/17 Review of the above records today demonstrates:  - Left ventricle: Distal septal hypokinesis. The cavity size was normal. Systolic function was normal. The estimated ejection fraction was 55%. Wall motion was normal; there were no regional wall motion abnormalities. Left ventricular diastolic function parameters were normal. - Atrial septum: No defect  or patent foramen ovale was identified. - Pulmonary arteries: PA peak pressure: 34 mm Hg (S).   Past Medical History:  Diagnosis Date  . Anemia    hx of years ago   . Anxiety   . Aortic valve disorders   . Arthritis   . Asthma   . Barrett esophagus   . Bipolar disorder ([place])   . CHF (congestive heart failure) ([place])   .  Chronic bronchitis ([place])   . Chronic kidney disease    "I don't have but 1" (08/03/2016)  . COPD (chronic obstructive pulmonary disease) ([place])    "just have a touch" (08/03/2016)  . Depression   . Esophageal reflux   . Esophageal stricture   . Fibromyalgia    "hands, knees, elbows; back; think it's in my hips" (08/03/2016)  . GAD (generalized anxiety disorder)   . GERD (gastroesophageal reflux disease)   . Hiatal hernia   . History of blood transfusion 1970s; 01/08/2013   "low blood count"  . History of kidney stones 01-08-13   past hx.  . History of nuclear stress test 01/2016   low risk study  . History of stomach ulcers    "some have bleed"  . Hyperlipemia   . Hypertension 12/07/2012  . Insomnia, unspecified   . Myocardial infarction [facility]) 01-08-13   '06-Chest pain-no stent-dx. MI-stress related  . Osteoporosis   . Personal history of colonic polyps 09/2010   TUBULAR ADENOMAS (X3); NEGATIVE FOR HIGH GRADE DYSPLASIA OR MALIGNANCY.  [name] Pneumonia    "several times" (08/03/2016)  . Stroke [facility])    "2 or 3"; remains with some right sided weaknes (08/03/2016)  . Takotsubo syndrome   . Tremor, essential 03/20/2017  . Type II diabetes mellitus ([place])   . Urinary frequency    AND INCONTINENCE    Past Surgical History:  Procedure Laterality Date  . ATRIAL FIBRILLATION ABLATION  08/03/2016  . CARDIAC CATHETERIZATION     normal per Dr Acie Fredrickson in note dated 02/06/12   . CARDIAC CATHETERIZATION     "I've had 2 or 3" (08/03/2016)  . CARPAL TUNNEL RELEASE Right   . CATARACT EXTRACTION W/ INTRAOCULAR LENS  IMPLANT, BILATERAL    . COLONOSCOPY W/ BIOPSIES AND POLYPECTOMY    . DILATION AND CURETTAGE OF UTERUS     S/P miscarriage  . ELECTROPHYSIOLOGIC STUDY N/A 08/03/2016   Procedure: Atrial Fibrillation Ablation;  Surgeon: Will Meredith Leeds, MD;  Location: [facility];  Service: Cardiovascular;  Laterality: N/A;  . ESOPHAGEAL DILATION  01-08-13   2 yrs ago  . HEEL SPUR SURGERY Left 2005  .  KNEE ARTHROSCOPY  02/28/2012   Procedure: ARTHROSCOPY KNEE;  Surgeon: Tobi Bastos, MD;  Location: [facility];  Service: Orthopedics;  Laterality: Left;  . LAPAROSCOPIC CHOLECYSTECTOMY  03/2002  . SHOULDER OPEN ROTATOR CUFF REPAIR Right 2011  . TONSILLECTOMY AND ADENOIDECTOMY  1965  . TOTAL KNEE ARTHROPLASTY Left 01/17/2013   Procedure: LEFT TOTAL KNEE ARTHROPLASTY;  Surgeon: Tobi Bastos, MD;  Location: [facility];  Service: Orthopedics;  Laterality: Left;  . TOTAL KNEE ARTHROPLASTY Right 06/19/2014   Procedure: RIGHT TOTAL KNEE ARTHROPLASTY;  Surgeon: Tobi Bastos, MD;  Location: [facility];  Service: Orthopedics;  Laterality: Right;  . TUBAL LIGATION    . VAGINAL HYSTERECTOMY     "partial"  . WRIST FLEXION TENDON TENOTOMIES AND PROXIMAL CORPECTOMY W/ WRIST ARTHRODESIS&ILIAC CREST BONE GRAFT      MEDICATIONS: . acetaminophen (TYLENOL) 325 MG tablet  .  albuterol (PROVENTIL) (2.5 MG/3ML) 0.083% nebulizer solution  . ALPRAZolam (XANAX) 0.5 MG tablet  . apixaban (ELIQUIS) 5 MG TABS tablet  . atorvastatin (LIPITOR) 80 MG tablet  . azelastine (ASTELIN) 0.1 % nasal spray  . beta carotene 25000 UNIT capsule  . cholecalciferol (VITAMIN D) 1000 UNITS tablet  . dexlansoprazole (DEXILANT) 60 MG capsule  . docusate sodium (COLACE) 100 MG capsule  . EPINEPHrine (EPIPEN 2-PAK) 0.3 mg/0.3 mL IJ SOAJ injection  . Flaxseed, Linseed, (FLAXSEED OIL) 1200 MG CAPS  . furosemide (LASIX) 20 MG tablet  . gabapentin (NEURONTIN) 300 MG capsule  . glipiZIDE (GLUCOTROL) 5 MG tablet  . HYDROcodone-acetaminophen (NORCO/VICODIN) 5-325 MG tablet  . isosorbide mononitrate (IMDUR) 30 MG 24 hr tablet  . losartan (COZAAR) 50 MG tablet  . montelukast (SINGULAIR) 10 MG tablet  . PARoxetine (PAXIL) 30 MG tablet  . PROAIR HFA 108 (90 Base) MCG/ACT inhaler  . SPIRIVA HANDIHALER 18 MCG inhalation capsule  . vitamin B-12 (CYANOCOBALAMIN) 1000 MCG tablet   No current facility-administered medications for this encounter.     . tranexamic acid (CYKLOKAPRON) 2,000 mg in sodium chloride 0.9 % 50 mL Topical Application   Wynonia Musty [facility] Short Stay Center/Anesthesiology Phone [phone] 01/29/2019 9:25 AM

## 2019-01-29 NOTE — Anesthesia Preprocedure Evaluation (Addendum)
Anesthesia Evaluation  Patient identified by MRN, date of birth, ID band Patient awake    Reviewed: Allergy & Precautions, NPO status , Patient's Chart, lab work & pertinent test results  Airway Mallampati: II  TM Distance: >3 FB Neck ROM: Full  Mouth opening: Limited Mouth Opening  Dental  (+) Edentulous Upper, Edentulous Lower   Pulmonary COPD, Current Smoker,  Spirometry 11/07/2016-moderate obstructive airways disease. FVC 1.95/70%, FEV1 1.43/67%, ratio 0.73, FEF 25-75% 0.99/49%   Pulmonary exam normal breath sounds clear to auscultation       Cardiovascular hypertension, + Past MI and +CHF  Normal cardiovascular exam+ dysrhythmias (s/p ablation 2018) Atrial Fibrillation  Rhythm:Regular Rate:Normal  EKG: 03/24/18: Sinus rhythm. Rate 55. Atrial premature complexes. Low voltage, precordial leads. Anteroseptal infarct, old. Borderline T wave abnormalities. Baseline wander. When compared with ECG of 09/05/2016. No significant change was found  CV: Event monitor 05/25/18: Sinus rhythm with sinus tachycardia.  Patient triggers were associated with sinus rhythm. Short runs of SVT, maximum 20 beats without symptoms   Nuclear stress 05/15/18:  Nuclear stress EF: 67%.  Normal perfusion  This is a low risk study.  TTE 03/02/17 Review of the above records today demonstrates:  - Left ventricle: Distal septal hypokinesis. The cavity size was normal. Systolic function was normal. The estimated ejection fraction was 55%. Wall motion was normal; there were no regional wall motion abnormalities. Left ventricular diastolic function parameters were normal. - Atrial septum: No defect or patent foramen ovale was identified. - Pulmonary arteries: PA peak pressure: 34 mm Hg (S).   Neuro/Psych PSYCHIATRIC DISORDERS Anxiety Depression Bipolar Disorder CVA (right sided weakness), Residual Symptoms    GI/Hepatic Neg liver ROS, hiatal  hernia, GERD  ,  Endo/Other  diabetes, Type 2  Renal/GU negative Renal ROS  negative genitourinary   Musculoskeletal  (+) Arthritis , Fibromyalgia -  Abdominal   Peds  Hematology negative hematology ROS (+)   Anesthesia Other Findings   Reproductive/Obstetrics                           Anesthesia Physical Anesthesia Plan  ASA: III  Anesthesia Plan: General   Post-op Pain Management:    Induction: Intravenous  PONV Risk Score and Plan: 2 and Ondansetron, Dexamethasone and Midazolam  Airway Management Planned: Oral ETT  Additional Equipment:   Intra-op Plan:   Post-operative Plan: Extubation in OR  Informed Consent: I have reviewed the patients History and Physical, chart, labs and discussed the procedure including the risks, benefits and alternatives for the proposed anesthesia with the patient or authorized representative who has indicated his/her understanding and acceptance.     Dental advisory given  Plan Discussed with: CRNA  Anesthesia Plan Comments: ( )       Anesthesia Quick Evaluation

## 2019-01-31 ENCOUNTER — Observation Stay (HOSPITAL_COMMUNITY)
Admission: RE | Admit: 2019-01-31 | Discharge: 2019-02-01 | Disposition: A | Discharge status: Home / Self Care | Payer: Medicare Other | Attending: Orthopedic Surgery | Admitting: Orthopedic Surgery

## 2019-01-31 ENCOUNTER — Ambulatory Visit (HOSPITAL_COMMUNITY): Payer: Medicare Other | Admitting: Physician Assistant

## 2019-01-31 ENCOUNTER — Other Ambulatory Visit: Payer: Self-pay

## 2019-01-31 ENCOUNTER — Ambulatory Visit (HOSPITAL_COMMUNITY)
Admission: RE | Disposition: A | Discharge status: Home / Self Care | Payer: Self-pay | Source: Home / Self Care | Attending: Orthopedic Surgery

## 2019-01-31 ENCOUNTER — Ambulatory Visit (HOSPITAL_COMMUNITY): Payer: Medicare Other | Admitting: Anesthesiology

## 2019-01-31 ENCOUNTER — Encounter (HOSPITAL_COMMUNITY): Payer: Self-pay | Admitting: Surgery

## 2019-01-31 ENCOUNTER — Ambulatory Visit (HOSPITAL_COMMUNITY): Payer: Medicare Other

## 2019-01-31 DIAGNOSIS — Z79899 Other long term (current) drug therapy: Secondary | ICD-10-CM | POA: Insufficient documentation

## 2019-01-31 DIAGNOSIS — Z96653 Presence of artificial knee joint, bilateral: Secondary | ICD-10-CM | POA: Insufficient documentation

## 2019-01-31 DIAGNOSIS — E785 Hyperlipidemia, unspecified: Secondary | ICD-10-CM | POA: Insufficient documentation

## 2019-01-31 DIAGNOSIS — F411 Generalized anxiety disorder: Secondary | ICD-10-CM | POA: Insufficient documentation

## 2019-01-31 DIAGNOSIS — E1122 Type 2 diabetes mellitus with diabetic chronic kidney disease: Secondary | ICD-10-CM | POA: Diagnosis not present

## 2019-01-31 DIAGNOSIS — I5021 Acute systolic (congestive) heart failure: Secondary | ICD-10-CM | POA: Insufficient documentation

## 2019-01-31 DIAGNOSIS — Z1159 Encounter for screening for other viral diseases: Secondary | ICD-10-CM | POA: Diagnosis not present

## 2019-01-31 DIAGNOSIS — M2578 Osteophyte, vertebrae: Secondary | ICD-10-CM | POA: Insufficient documentation

## 2019-01-31 DIAGNOSIS — Z7901 Long term (current) use of anticoagulants: Secondary | ICD-10-CM | POA: Insufficient documentation

## 2019-01-31 DIAGNOSIS — I4891 Unspecified atrial fibrillation: Secondary | ICD-10-CM | POA: Insufficient documentation

## 2019-01-31 DIAGNOSIS — I69351 Hemiplegia and hemiparesis following cerebral infarction affecting right dominant side: Secondary | ICD-10-CM | POA: Insufficient documentation

## 2019-01-31 DIAGNOSIS — N189 Chronic kidney disease, unspecified: Secondary | ICD-10-CM | POA: Insufficient documentation

## 2019-01-31 DIAGNOSIS — Z419 Encounter for procedure for purposes other than remedying health state, unspecified: Secondary | ICD-10-CM

## 2019-01-31 DIAGNOSIS — J449 Chronic obstructive pulmonary disease, unspecified: Secondary | ICD-10-CM | POA: Insufficient documentation

## 2019-01-31 DIAGNOSIS — K219 Gastro-esophageal reflux disease without esophagitis: Secondary | ICD-10-CM | POA: Insufficient documentation

## 2019-01-31 DIAGNOSIS — I130 Hypertensive heart and chronic kidney disease with heart failure and stage 1 through stage 4 chronic kidney disease, or unspecified chronic kidney disease: Secondary | ICD-10-CM | POA: Insufficient documentation

## 2019-01-31 DIAGNOSIS — M5020 Other cervical disc displacement, unspecified cervical region: Secondary | ICD-10-CM | POA: Diagnosis present

## 2019-01-31 DIAGNOSIS — Z7984 Long term (current) use of oral hypoglycemic drugs: Secondary | ICD-10-CM | POA: Insufficient documentation

## 2019-01-31 DIAGNOSIS — I252 Old myocardial infarction: Secondary | ICD-10-CM | POA: Insufficient documentation

## 2019-01-31 DIAGNOSIS — F329 Major depressive disorder, single episode, unspecified: Secondary | ICD-10-CM | POA: Insufficient documentation

## 2019-01-31 DIAGNOSIS — M50122 Cervical disc disorder at C5-C6 level with radiculopathy: Principal | ICD-10-CM | POA: Insufficient documentation

## 2019-01-31 DIAGNOSIS — F17210 Nicotine dependence, cigarettes, uncomplicated: Secondary | ICD-10-CM | POA: Insufficient documentation

## 2019-01-31 HISTORY — PX: ANTERIOR CERVICAL DECOMP/DISCECTOMY FUSION: SHX1161

## 2019-01-31 LAB — GLUCOSE, CAPILLARY
Glucose-Capillary: 151 mg/dL — ABNORMAL HIGH (ref 70–99)
Glucose-Capillary: 164 mg/dL — ABNORMAL HIGH (ref 70–99)
Glucose-Capillary: 148 mg/dL — ABNORMAL HIGH (ref 70–99)
Glucose-Capillary: 162 mg/dL — ABNORMAL HIGH (ref 70–99)
Glucose-Capillary: 127 mg/dL — ABNORMAL HIGH (ref 70–99)

## 2019-01-31 LAB — PROTIME-INR
INR: 1 (ref 0.8–1.2)
Prothrombin Time: 12.8 seconds (ref 11.4–15.2)

## 2019-01-31 LAB — APTT: aPTT: 33 seconds (ref 24–36)

## 2019-01-31 SURGERY — ANTERIOR CERVICAL DECOMPRESSION/DISCECTOMY FUSION 1 LEVEL
Anesthesia: General

## 2019-01-31 MED ORDER — ACETAMINOPHEN 325 MG PO TABS: 650.0000 mg | ORAL_TABLET | ORAL | Status: DC | PRN

## 2019-01-31 MED ORDER — ONDANSETRON HCL 4 MG/2ML IJ SOLN
INTRAMUSCULAR | Status: DC | PRN
  Administered 2019-01-31: 4 mg via INTRAVENOUS

## 2019-01-31 MED ORDER — OXYCODONE-ACETAMINOPHEN 10-325 MG PO TABS: 1.0000 | ORAL_TABLET | Freq: Four times a day (QID) | ORAL | 0 refills | Status: AC | PRN

## 2019-01-31 MED ORDER — PROPOFOL 10 MG/ML IV BOLUS
INTRAVENOUS | Status: DC | PRN
  Administered 2019-01-31: 90 mg via INTRAVENOUS

## 2019-01-31 MED ORDER — GABAPENTIN 300 MG PO CAPS
300.0000 mg | ORAL_CAPSULE | Freq: Three times a day (TID) | ORAL | Status: DC
  Administered 2019-01-31 (×2): 300 mg via ORAL
  Filled 2019-01-31 (×2): qty 1

## 2019-01-31 MED ORDER — ACETAMINOPHEN 500 MG PO TABS
1000.0000 mg | ORAL_TABLET | Freq: Once | ORAL | Status: AC
  Administered 2019-01-31: 07:00:00 1000 mg via ORAL
  Filled 2019-01-31: qty 2

## 2019-01-31 MED ORDER — ONDANSETRON HCL 4 MG/2ML IJ SOLN
4.0000 mg | Freq: Four times a day (QID) | INTRAMUSCULAR | Status: DC | PRN
  Administered 2019-02-01: 4 mg via INTRAVENOUS
  Filled 2019-01-31: qty 2

## 2019-01-31 MED ORDER — TIOTROPIUM BROMIDE MONOHYDRATE 18 MCG IN CAPS: 18.0000 ug | ORAL_CAPSULE | Freq: Every day | RESPIRATORY_TRACT | Status: DC

## 2019-01-31 MED ORDER — THROMBIN 20000 UNITS EX SOLR
CUTANEOUS | Status: DC | PRN
  Administered 2019-01-31: 20 mL via TOPICAL

## 2019-01-31 MED ORDER — MORPHINE SULFATE (PF) 2 MG/ML IV SOLN
2.0000 mg | INTRAVENOUS | Status: DC | PRN
  Administered 2019-01-31: 16:00:00 2 mg via INTRAVENOUS
  Filled 2019-01-31: qty 1

## 2019-01-31 MED ORDER — VANCOMYCIN HCL IN DEXTROSE 1-5 GM/200ML-% IV SOLN
1000.0000 mg | Freq: Once | INTRAVENOUS | Status: AC
  Administered 2019-01-31: 21:00:00 1000 mg via INTRAVENOUS
  Filled 2019-01-31: qty 200

## 2019-01-31 MED ORDER — FENTANYL CITRATE (PF) 100 MCG/2ML IJ SOLN
INTRAMUSCULAR | Status: AC
  Filled 2019-01-31: qty 2

## 2019-01-31 MED ORDER — VANCOMYCIN HCL IN DEXTROSE 1-5 GM/200ML-% IV SOLN
INTRAVENOUS | Status: AC
  Filled 2019-01-31: qty 200

## 2019-01-31 MED ORDER — GLIPIZIDE 5 MG PO TABS
5.0000 mg | ORAL_TABLET | Freq: Two times a day (BID) | ORAL | Status: DC
  Administered 2019-01-31 – 2019-02-01 (×2): 5 mg via ORAL
  Filled 2019-01-31 (×2): qty 1

## 2019-01-31 MED ORDER — BUPIVACAINE-EPINEPHRINE 0.25% -1:200000 IJ SOLN
INTRAMUSCULAR | Status: DC | PRN
  Administered 2019-01-31: 6 mL

## 2019-01-31 MED ORDER — ROCURONIUM BROMIDE 10 MG/ML (PF) SYRINGE
PREFILLED_SYRINGE | INTRAVENOUS | Status: AC
  Filled 2019-01-31: qty 10

## 2019-01-31 MED ORDER — VANCOMYCIN HCL IN DEXTROSE 1-5 GM/200ML-% IV SOLN
1000.0000 mg | INTRAVENOUS | Status: AC
  Administered 2019-01-31: 1000 mg via INTRAVENOUS

## 2019-01-31 MED ORDER — ISOSORBIDE MONONITRATE ER 30 MG PO TB24
30.0000 mg | ORAL_TABLET | Freq: Every day | ORAL | Status: DC
  Administered 2019-01-31: 15:00:00 30 mg via ORAL
  Filled 2019-01-31 (×2): qty 1

## 2019-01-31 MED ORDER — OXYCODONE HCL 5 MG PO TABS
5.0000 mg | ORAL_TABLET | ORAL | Status: DC | PRN
  Administered 2019-01-31 (×3): 5 mg via ORAL
  Filled 2019-01-31 (×2): qty 1

## 2019-01-31 MED ORDER — SODIUM CHLORIDE 0.9% FLUSH
3.0000 mL | Freq: Two times a day (BID) | INTRAVENOUS | Status: DC
  Administered 2019-01-31: 21:00:00 3 mL via INTRAVENOUS

## 2019-01-31 MED ORDER — LACTATED RINGERS IV SOLN: INTRAVENOUS | Status: DC

## 2019-01-31 MED ORDER — ALBUTEROL SULFATE (2.5 MG/3ML) 0.083% IN NEBU: 2.5000 mg | INHALATION_SOLUTION | Freq: Four times a day (QID) | RESPIRATORY_TRACT | Status: DC | PRN

## 2019-01-31 MED ORDER — LIDOCAINE 2% (20 MG/ML) 5 ML SYRINGE
INTRAMUSCULAR | Status: AC
  Filled 2019-01-31: qty 5

## 2019-01-31 MED ORDER — FUROSEMIDE 20 MG PO TABS: 20.0000 mg | ORAL_TABLET | Freq: Every day | ORAL | Status: DC

## 2019-01-31 MED ORDER — FENTANYL CITRATE (PF) 250 MCG/5ML IJ SOLN
INTRAMUSCULAR | Status: AC
  Filled 2019-01-31: qty 5

## 2019-01-31 MED ORDER — MENTHOL 3 MG MT LOZG: 1.0000 | LOZENGE | OROMUCOSAL | Status: DC | PRN

## 2019-01-31 MED ORDER — SODIUM CHLORIDE 0.9 % IV SOLN
INTRAVENOUS | Status: DC | PRN
  Administered 2019-01-31: 10 ug/min via INTRAVENOUS

## 2019-01-31 MED ORDER — METHOCARBAMOL 500 MG PO TABS: 500.0000 mg | ORAL_TABLET | Freq: Three times a day (TID) | ORAL | 0 refills | Status: AC | PRN

## 2019-01-31 MED ORDER — METHOCARBAMOL 500 MG PO TABS
ORAL_TABLET | ORAL | Status: AC
  Administered 2019-01-31: 11:00:00 500 mg via ORAL
  Filled 2019-01-31: qty 1

## 2019-01-31 MED ORDER — ACETAMINOPHEN 10 MG/ML IV SOLN
INTRAVENOUS | Status: AC
  Filled 2019-01-31: qty 100

## 2019-01-31 MED ORDER — HEMOSTATIC AGENTS (NO CHARGE) OPTIME
TOPICAL | Status: DC | PRN
  Administered 2019-01-31: 1 via TOPICAL

## 2019-01-31 MED ORDER — MIDAZOLAM HCL 2 MG/2ML IJ SOLN
INTRAMUSCULAR | Status: AC
  Filled 2019-01-31: qty 2

## 2019-01-31 MED ORDER — PAROXETINE HCL 30 MG PO TABS
30.0000 mg | ORAL_TABLET | Freq: Every day | ORAL | Status: DC
  Administered 2019-01-31: 21:00:00 30 mg via ORAL
  Filled 2019-01-31: qty 1

## 2019-01-31 MED ORDER — LIDOCAINE 2% (20 MG/ML) 5 ML SYRINGE
INTRAMUSCULAR | Status: DC | PRN
  Administered 2019-01-31: 60 mg via INTRAVENOUS

## 2019-01-31 MED ORDER — FENTANYL CITRATE (PF) 100 MCG/2ML IJ SOLN
INTRAMUSCULAR | Status: DC | PRN
  Administered 2019-01-31: 50 ug via INTRAVENOUS
  Administered 2019-01-31: 25 ug via INTRAVENOUS
  Administered 2019-01-31 (×2): 50 ug via INTRAVENOUS
  Administered 2019-01-31: 25 ug via INTRAVENOUS
  Administered 2019-01-31: 50 ug via INTRAVENOUS

## 2019-01-31 MED ORDER — ROCURONIUM BROMIDE 50 MG/5ML IV SOSY
PREFILLED_SYRINGE | INTRAVENOUS | Status: DC | PRN
  Administered 2019-01-31: 70 mg via INTRAVENOUS

## 2019-01-31 MED ORDER — ONDANSETRON HCL 4 MG/2ML IJ SOLN
INTRAMUSCULAR | Status: AC
  Filled 2019-01-31: qty 2

## 2019-01-31 MED ORDER — 0.9 % SODIUM CHLORIDE (POUR BTL) OPTIME
TOPICAL | Status: DC | PRN
  Administered 2019-01-31: 1000 mL

## 2019-01-31 MED ORDER — LOSARTAN POTASSIUM 50 MG PO TABS
50.0000 mg | ORAL_TABLET | Freq: Every day | ORAL | Status: DC
  Administered 2019-01-31: 15:00:00 50 mg via ORAL
  Filled 2019-01-31 (×2): qty 1

## 2019-01-31 MED ORDER — ALPRAZOLAM 0.5 MG PO TABS
1.0000 mg | ORAL_TABLET | Freq: Three times a day (TID) | ORAL | Status: DC | PRN
  Administered 2019-01-31 – 2019-02-01 (×3): 1 mg via ORAL
  Filled 2019-01-31 (×3): qty 2

## 2019-01-31 MED ORDER — LACTATED RINGERS IV SOLN
INTRAVENOUS | Status: DC | PRN
  Administered 2019-01-31: 08:00:00 via INTRAVENOUS

## 2019-01-31 MED ORDER — OXYCODONE HCL 5 MG PO TABS
10.0000 mg | ORAL_TABLET | ORAL | Status: DC | PRN
  Administered 2019-01-31 – 2019-02-01 (×3): 10 mg via ORAL
  Filled 2019-01-31 (×3): qty 2

## 2019-01-31 MED ORDER — SODIUM CHLORIDE 0.9% FLUSH: 3.0000 mL | INTRAVENOUS | Status: DC | PRN

## 2019-01-31 MED ORDER — ONDANSETRON HCL 4 MG PO TABS: 4.0000 mg | ORAL_TABLET | Freq: Four times a day (QID) | ORAL | Status: DC | PRN

## 2019-01-31 MED ORDER — PROPOFOL 10 MG/ML IV BOLUS
INTRAVENOUS | Status: AC
  Filled 2019-01-31: qty 40

## 2019-01-31 MED ORDER — METHOCARBAMOL 1000 MG/10ML IJ SOLN
500.0000 mg | Freq: Four times a day (QID) | INTRAVENOUS | Status: DC | PRN
  Filled 2019-01-31: qty 5

## 2019-01-31 MED ORDER — INSULIN ASPART 100 UNIT/ML ~~LOC~~ SOLN: 0.0000 [IU] | Freq: Every day | SUBCUTANEOUS | Status: DC

## 2019-01-31 MED ORDER — ACETAMINOPHEN 650 MG RE SUPP: 650.0000 mg | RECTAL | Status: DC | PRN

## 2019-01-31 MED ORDER — SUGAMMADEX SODIUM 200 MG/2ML IV SOLN
INTRAVENOUS | Status: DC | PRN
  Administered 2019-01-31: 150 mg via INTRAVENOUS

## 2019-01-31 MED ORDER — THROMBIN (RECOMBINANT) 20000 UNITS EX SOLR
CUTANEOUS | Status: AC
  Filled 2019-01-31: qty 20000

## 2019-01-31 MED ORDER — METHOCARBAMOL 500 MG PO TABS
500.0000 mg | ORAL_TABLET | Freq: Four times a day (QID) | ORAL | Status: DC | PRN
  Administered 2019-01-31 – 2019-02-01 (×3): 500 mg via ORAL
  Filled 2019-01-31 (×2): qty 1

## 2019-01-31 MED ORDER — ONDANSETRON HCL 4 MG PO TABS: 4.0000 mg | ORAL_TABLET | Freq: Three times a day (TID) | ORAL | 0 refills | Status: DC | PRN

## 2019-01-31 MED ORDER — INSULIN ASPART 100 UNIT/ML ~~LOC~~ SOLN
0.0000 [IU] | Freq: Three times a day (TID) | SUBCUTANEOUS | Status: DC
  Administered 2019-01-31: 18:00:00 3 [IU] via SUBCUTANEOUS
  Administered 2019-02-01: 08:00:00 5 [IU] via SUBCUTANEOUS

## 2019-01-31 MED ORDER — OXYCODONE HCL 5 MG PO TABS
ORAL_TABLET | ORAL | Status: AC
  Administered 2019-01-31: 11:00:00 5 mg via ORAL
  Filled 2019-01-31: qty 1

## 2019-01-31 MED ORDER — PHENOL 1.4 % MT LIQD
1.0000 | OROMUCOSAL | Status: DC | PRN
  Administered 2019-01-31: 21:00:00 1 via OROMUCOSAL
  Filled 2019-01-31: qty 177

## 2019-01-31 MED ORDER — ATORVASTATIN CALCIUM 80 MG PO TABS
80.0000 mg | ORAL_TABLET | Freq: Every day | ORAL | Status: DC
  Administered 2019-01-31: 18:00:00 80 mg via ORAL
  Filled 2019-01-31: qty 1

## 2019-01-31 MED ORDER — BUPIVACAINE-EPINEPHRINE (PF) 0.25% -1:200000 IJ SOLN
INTRAMUSCULAR | Status: AC
  Filled 2019-01-31: qty 30

## 2019-01-31 MED ORDER — FENTANYL CITRATE (PF) 100 MCG/2ML IJ SOLN: 25.0000 ug | INTRAMUSCULAR | Status: DC | PRN

## 2019-01-31 MED ORDER — ALBUTEROL SULFATE HFA 108 (90 BASE) MCG/ACT IN AERS: 2.0000 | INHALATION_SPRAY | Freq: Four times a day (QID) | RESPIRATORY_TRACT | Status: DC | PRN

## 2019-01-31 SURGICAL SUPPLY — 60 items
BLADE CLIPPER SURG (BLADE) IMPLANT
BONE VIVIGEN FORMABLE 1.3CC (Bone Implant) ×3 IMPLANT
CABLE BIPOLOR RESECTION CORD (MISCELLANEOUS) ×3 IMPLANT
CAGE LORDOTIC TC SM 8 6D (Cage) ×2 IMPLANT
CANISTER SUCT 3000ML PPV (MISCELLANEOUS) ×3 IMPLANT
CLOSURE STERI-STRIP 1/2X4 (GAUZE/BANDAGES/DRESSINGS) ×1
CLSR STERI-STRIP ANTIMIC 1/2X4 (GAUZE/BANDAGES/DRESSINGS) ×2 IMPLANT
COVER MAYO STAND STRL (DRAPES) ×9 IMPLANT
COVER SURGICAL LIGHT HANDLE (MISCELLANEOUS) ×6 IMPLANT
COVER WAND RF STERILE (DRAPES) ×3 IMPLANT
DRAPE C-ARM 42X72 X-RAY (DRAPES) ×3 IMPLANT
DRAPE POUCH INSTRU U-SHP 10X18 (DRAPES) ×3 IMPLANT
DRAPE SURG 17X23 STRL (DRAPES) ×3 IMPLANT
DRAPE U-SHAPE 47X51 STRL (DRAPES) ×3 IMPLANT
DRSG OPSITE POSTOP 3X4 (GAUZE/BANDAGES/DRESSINGS) ×3 IMPLANT
DURAPREP 26ML APPLICATOR (WOUND CARE) ×3 IMPLANT
ELECT COATED BLADE 2.86 ST (ELECTRODE) ×3 IMPLANT
ELECT PENCIL ROCKER SW 15FT (MISCELLANEOUS) ×3 IMPLANT
ELECT REM PT RETURN 9FT ADLT (ELECTROSURGICAL) ×3
ELECTRODE REM PT RTRN 9FT ADLT (ELECTROSURGICAL) ×1 IMPLANT
FLOSEAL 10ML (HEMOSTASIS) ×2 IMPLANT
GLOVE BIO SURGEON STRL SZ 6.5 (GLOVE) ×2 IMPLANT
GLOVE BIO SURGEONS STRL SZ 6.5 (GLOVE) ×1
GLOVE BIOGEL PI IND STRL 6.5 (GLOVE) ×1 IMPLANT
GLOVE BIOGEL PI IND STRL 8.5 (GLOVE) ×1 IMPLANT
GLOVE BIOGEL PI INDICATOR 6.5 (GLOVE) ×2
GLOVE BIOGEL PI INDICATOR 8.5 (GLOVE) ×2
GLOVE SS BIOGEL STRL SZ 8.5 (GLOVE) ×1 IMPLANT
GLOVE SUPERSENSE BIOGEL SZ 8.5 (GLOVE) ×2
GOWN STRL REUS W/ TWL LRG LVL3 (GOWN DISPOSABLE) ×1 IMPLANT
GOWN STRL REUS W/TWL 2XL LVL3 (GOWN DISPOSABLE) ×3 IMPLANT
GOWN STRL REUS W/TWL LRG LVL3 (GOWN DISPOSABLE) ×3
GRAFT BNE MATRIX VG FRMBL SM 1 (Bone Implant) IMPLANT
KIT BASIN OR (CUSTOM PROCEDURE TRAY) ×3 IMPLANT
KIT TURNOVER KIT B (KITS) ×3 IMPLANT
NDL SPNL 18GX3.5 QUINCKE PK (NEEDLE) ×1 IMPLANT
NEEDLE HYPO 22GX1.5 SAFETY (NEEDLE) ×3 IMPLANT
NEEDLE SPNL 18GX3.5 QUINCKE PK (NEEDLE) ×3 IMPLANT
NS IRRIG 1000ML POUR BTL (IV SOLUTION) ×3 IMPLANT
PACK ORTHO CERVICAL (CUSTOM PROCEDURE TRAY) ×3 IMPLANT
PACK UNIVERSAL I (CUSTOM PROCEDURE TRAY) ×3 IMPLANT
PAD ARMBOARD 7.5X6 YLW CONV (MISCELLANEOUS) ×6 IMPLANT
PATTIES SURGICAL .25X.25 (GAUZE/BANDAGES/DRESSINGS) IMPLANT
PLATE SKYLINE 12MM (Plate) ×2 IMPLANT
POSITIONER HEAD DONUT 9IN (MISCELLANEOUS) ×3 IMPLANT
RESTRAINT LIMB HOLDER UNIV (RESTRAINTS) ×3 IMPLANT
SCREW SKYLINE VARIABLE LG (Screw) ×8 IMPLANT
SPONGE INTESTINAL PEANUT (DISPOSABLE) ×3 IMPLANT
SPONGE SURGIFOAM ABS GEL SZ50 (HEMOSTASIS) ×3 IMPLANT
SUT BONE WAX W31G (SUTURE) ×3 IMPLANT
SUT MON AB 3-0 SH 27 (SUTURE) ×3
SUT MON AB 3-0 SH27 (SUTURE) ×1 IMPLANT
SUT VIC AB 2-0 CT1 18 (SUTURE) ×3 IMPLANT
SYR BULB IRRIGATION 50ML (SYRINGE) ×3 IMPLANT
SYR CONTROL 10ML LL (SYRINGE) ×3 IMPLANT
TAPE CLOTH 4X10 WHT NS (GAUZE/BANDAGES/DRESSINGS) ×3 IMPLANT
TAPE UMBILICAL COTTON 1/8X30 (MISCELLANEOUS) ×3 IMPLANT
TOWEL GREEN STERILE (TOWEL DISPOSABLE) ×3 IMPLANT
TOWEL GREEN STERILE FF (TOWEL DISPOSABLE) ×3 IMPLANT
WATER STERILE IRR 1000ML POUR (IV SOLUTION) ×3 IMPLANT

## 2019-01-31 NOTE — Discharge Instructions (Signed)
[facility] Discharge instructions for Cervical Fusion    Today you will be discharged from the hospital.  The purpose of the following handout is to help guide you over the next 2 weeks.  First and foremost, be sure you have a follow up appointment with Dr. Rolena Infante 2 weeks from the time of your surgery to have your sutures removed.  Please call [facility] [phone] to schedule or confirm this appointment.    Restart ELIQUIS IN 2 DAYS: Saturday 02/02/19  Brace You do not have to wear the collar while lying in bed or sitting in a high-backed chair, eating, sleeping or showering.  Other than these instances, you must wear the brace.  You may NOT wear the collar while driving a vehicle (see driving restrictions below).  It is advisable that you wear the collar in public places or while traveling in a car as a passenger.  Dr. Rolena Infante will discuss further use of the collar at your 2 week postop visit.  Wound Care You may SHOWER 5 days from the date of surgery.  Shower directly over the steri-strips.  DO NOT scrub or submerge (bath tub, swimming pool, hot tub, etc.) the area.  Pat to dry following your shower.  There is no need for additional dressings other than the steri-strips.  Allow the steri-strips to fall off on their own.  Once the strips have fallen off, you may leave the area undressed.  DO NOT apply lotion/cream/ointment to the area.  The wound must remain dry at all times other than while showering.  Dr. Rolena Infante or his staff will remove your stiches at your first postop visit and give you additional instructions regarding wound care at that time.   Activity NO DRIVING FOR 2 WEEKS.  No lifting over 5 pounds (approximately a gallon of milk).  No bending, stooping, squatting or twisting.  No overhead activities.  We encourage you to walk (short distances and often throughout the day) as you can tolerate.  A good rule of thumb is to get up and move once or twice every hour.  You may go up  and down stairs carefully.  As you continue to recover, Dr. Rolena Infante will address and adjust restrictions to your activities until no further restrictions are needed.  However, until your first postop visit, when Dr. Rolena Infante can assess your recovery, you are to follow these instructions.  At the end of this document is a tentative outline of activities for up to 1 year.       Medication You will be discharged from the hospital with medication for pain, spasm, nausea and constipation.  You will be given enough medication to last until your first postop visit in 2 weeks.  Medications WILL NOT BE REFILLED EARLY; therefore, you are to take the medications only as directed.  If you have been given multiple prescriptions, please leave them with your pharmacy.  They can keep them on file for when you need them.  Medications that are lost or stolen WILL NOT be replaced.  We will address the need for continuing certain medications on an individual basis during your postop visit.  We ask that you avoid over the counter anti-inflammatory medications (Advil, Aleve, Motrin) for 3 months.    What you can expect following neck surgery... It is not uncommon to experience a sore throat or difficulty swallowing following neck surgery.  Cold liquids and soft foods are helpful in soothing this discomfort.  There is no specific diet that you  are to follow after surgery, however, there are a few things you should keep in mind to avoid unneeded discomfort.  Take small bites and eat slowly.  Chew your food thoroughly before swallowing.   It is not uncommon to experience incisional soreness or pain in the back of the neck, shoulders or between the shoulder blades.  These symptoms will slowly begin to resolve as you continue to recover, however, they can last for a few weeks.    It is not uncommon to experience INTERMITTENT arm pain following surgery.  This pain can mimic the arm pain you had prior to surgery.  As long as the pain  resolves on its own and is not constant, there is no need to become alarmed.   When To Call If you experience fever >101F, loss of bowel or bladder control, painful swelling in the lower extremities, constant (unresolving) arm pain.  If you experience any of these symptoms, please call [place] [phone].  What's Next As mentioned earlier, you will follow up with Dr. Rolena Infante in 2 weeks.  At that time, we will likely remove your stitches and discuss additional aspects of your recovery.                   ACTIVITY GUIDELINES ANTERIOR CERVICAL DISECTOMY AND FUSION  Activity Discharge 2 weeks 6 weeks 3 months 6 months 1 year  Shower 5 days        Submerge the wound  no no yes     Walking outdoors yes       Lifting 5 lbs yes       Climbing stairs yes       Cooking yes       Car rides (less than 30 minutes) yes       Car rides (greater than 30 minutes) no varies yes     Air travel no varies yes     Short outings [employer], visits, etc...) yes       School no no yes     Driving a car no no varies yes    Light upper extremity exercises no no varies yes    Stationary bike no no yes     Swimming (no diving) no no no varies yes   Vacuuming, laundry, mopping no no no varies yes   Biking outdoors no no no no varies yes  Light jogging no no no varies yes   Low impact aerobics no no no varies yes   Non-contact sports (tennis, golf) no no no varies yes   Hunting (no tree climbing) no no no varies yes   Dancing (non-gymnastics) no no no varies yes   Down-hill skiing (experienced skier) no no no no yes   Down-hill skiing (novice) no no no no yes   Cross-country skiing no no no no yes   Horseback riding (noncompetitive)  no no no no yes   Horseback riding (competitive) no no no no varies yes  Gardening/landscaping no no no varies yes   House repairs no no no varies varies yes  Lifting up to 50 lbs no no no no varies yes

## 2019-01-31 NOTE — Progress Notes (Signed)
Orthopedic Tech Progress Note Patient Details:  [name] [name] Aug 30, 1950 [id number]  Patient ID: [name] [name], female   DOB: 09/10/1950, 68 y.o.   MRN: [id number]   [name] [name] 01/31/2019, 10:31 AMCalled [employer] for [employer]

## 2019-01-31 NOTE — Brief Op Note (Signed)
01/31/2019  10:21 AM  PATIENT:  [name] [name]  68 y.o. female  PRE-OPERATIVE DIAGNOSIS:  Herniated cervical disc, cervical radiculopathy  POST-OPERATIVE DIAGNOSIS:  Herniated cervical disc, cervical radiculopathy  PROCEDURE:  Procedure(s) with comments: ANTERIOR CERVICAL DECOMPRESSION/DISCECTOMY FUSION C5-6 (N/A) - 3 hrs  SURGEON:  Surgeon(s) and Role:    Melina Schools, MD - Primary  PHYSICIAN ASSISTANT:   ASSISTANTS: Amanda Ward, PA   ANESTHESIA:   general  EBL:  minimal   BLOOD ADMINISTERED:none  DRAINS: none   LOCAL MEDICATIONS USED:  MARCAINE     SPECIMEN:  No Specimen  DISPOSITION OF SPECIMEN:  N/A  COUNTS:  YES  TOURNIQUET:  * No tourniquets in log *  DICTATION: .Dragon Dictation  PLAN OF CARE: Admit for overnight observation  PATIENT DISPOSITION:  PACU - hemodynamically stable.

## 2019-01-31 NOTE — Anesthesia Procedure Notes (Signed)
Procedure Name: Intubation Date/Time: 01/31/2019 8:04 AM Performed by: Scheryl Darter, CRNA Pre-anesthesia Checklist: Patient identified, Emergency Drugs available, Suction available and Patient being monitored Patient Re-evaluated:Patient Re-evaluated prior to induction Oxygen Delivery Method: Circle System Utilized Preoxygenation: Pre-oxygenation with 100% oxygen Induction Type: IV induction Ventilation: Mask ventilation without difficulty Laryngoscope Size: Miller and 2 Grade View: Grade I Tube type: Oral Tube size: 7.0 mm Number of attempts: 1 Airway Equipment and Method: Stylet and Oral airway Placement Confirmation: ETT inserted through vocal cords under direct vision,  positive ETCO2 and breath sounds checked- equal and bilateral Secured at: 19 cm Tube secured with: Tape Dental Injury: Teeth and Oropharynx as per pre-operative assessment

## 2019-01-31 NOTE — H&P (Signed)
Addendum H&P by Dr. Rolena Infante  Patient continues to have significant neck and radicular arm pain.  There is been no change in her clinical exam from her last office visit of 01/25/2019.  Plan on moving forward with a single level ACDF C5-6.  I have again reviewed the risks and benefits of surgery with her.  All of her questions were encouraged and addressed.

## 2019-01-31 NOTE — Anesthesia Postprocedure Evaluation (Signed)
Anesthesia Post Note  Patient: [name] [name]  Procedure(s) Performed: ANTERIOR CERVICAL DECOMPRESSION/DISCECTOMY FUSION C5-6 (N/A )     Patient location during evaluation: PACU Anesthesia Type: General Level of consciousness: awake and alert Pain management: pain level controlled Vital Signs Assessment: post-procedure vital signs reviewed and stable Respiratory status: spontaneous breathing, nonlabored ventilation, respiratory function stable and patient connected to nasal cannula oxygen Cardiovascular status: blood pressure returned to baseline and stable Postop Assessment: no apparent nausea or vomiting Anesthetic complications: no    Last Vitals:  Vitals:   01/31/19 1110 01/31/19 1155  BP: (!) 146/75   Pulse: 65   Resp: 13   Temp:  (!) 36.1 C  SpO2: 97%     Last Pain:  Vitals:   01/31/19 1155  PainSc: 2                  Cameo Shewell L Donyea Gafford

## 2019-01-31 NOTE — Op Note (Signed)
Operative report  Preoperative diagnosis: Cervical radiculopathy secondary to cervical disc herniation C5-6 with left C6 nerve compression.  Postoperative diagnosis: Same  Operative procedure:  Anterior cervical discectomy and fusion C5-6  First Assistant: Cleta Alberts, PA  Implant: Medtronic Titan Nano lock intervertebral spacer 8 mm small lordotic cage.  Depuy anterior cervical skyline plate.  12 mm plate, secured with 12 mm rescue screws.  Allograft:  vivogen  Indications: [name] [name] is a very pleasant 68 year old man who presents with significant neck and neuropathic left arm pain.  Clinical exam demonstrates C6 radicular pain, positive nerve root tension sign, and significant loss in quality of life.  Imaging studies confirm posterior lateral disc herniation at C5-6 to the left side with compression of the C6 nerve root.  After discussing treatment options she elected to move forward with surgery.  All appropriate risks benefits and alternatives were discussed with the patient and consent was obtained.  Operative report: Patient was brought the operating room placed upon the operating table.  After successful induction of general anesthesia and endotracheal patient teds SCDs were applied and the anterior cervical spine was prepped and draped in standard fashion.  Timeout was taken to confirm patient procedure and all other important data.  X-ray was used to identify the C5-6 disc space and the skin incision was marked out.  I then infiltrated the incision site with quarter percent Marcaine with epinephrine and a transverse incision was made centered over the C5-6 disc space.  Sharp dissection was carried down to the platysma.  Platysma was sharply incised and I continued to perform a standard Smith-Robinson approach in the anterior cervical spine.  Dissection carried out along the medial border the sternocleidomastoid through the deep cervical fascia.  The omohyoid was isolated and retracted.  I  continued dissecting sharply through the deep cervical and prevertebral fascia until I came down to the anterior longitudinal ligament.  Bluntly I was able to palpate the carotid sheath and sweep the esophagus to the right.  L retractor was then placed and using Kitner dissectors I remove the remaining prevertebral fascia to expose the anterior longitudinal ligament.  A needle was placed into the C5-6 disc space and an x-ray was taken to confirm that I was at the appropriate level.  Once confirmed I then continued with my dissection using the bipolar electrocautery.  I mobilized the longus coli muscles from the inferior aspect of the C4 vertebral body to the superior aspect of the C7 vertebral body.  Caspar retractor blades were placed underneath the longus coli muscle and the endotracheal cuff was deflated.  I then expanded the retractor system and then reinflated the endotracheal cuff.  Annulotomy was performed with a 15 scalpel and I used pituitary Roger to remove the bulk of the disc material.  I then used my curettes to continue removing the disc and cartilaginous endplate to expose bleeding subchondral bone.  Distraction pins were placed into the body of C5 and C6 and I gently distracted the space.  I remove the overhanging osteophyte from the inferior aspect of the C5 vertebral body and I continued dissecting with my curettes removing the disc material.  I then identified a small fragment of disc material in the left posterior lateral corner.  Using my nerve hook I was able to gently mobilize this and remove the large posterior lateral disc herniation.  This was consistent with what was seen on the preoperative MRI.  Once this was out I was able to remove the remainder  of the posterior annulus and trimmed down the posterior osteophyte from the vertebral bodies of C5 and 6 with my 1 mm Kerrison Roger.  I then was able to freely pass my nerve hook out under the C5 and C6 vertebral body and under the  uncovertebral joint.  The left side was adequately decompressed.  At this point I was pleased with the discectomy and decompression.  And so I rasped the endplates and then began trialing.  The size 8 small intervertebral spacer provided the best overall fit.  The implant was obtained and filled with the allograft.  I then malleted to the appropriate depth.  Distraction pins were removed and the anterior cervical plate was secured with 12 mm rescue screws self drilling into the bodies of C5 and C6.  All 4 screws had excellent purchase.  Final torque locking maneuver was performed according manufacturer standards.  Hemostasis was obtained using bipolar cautery and FloSeal.  The wound was copiously irrigated with normal saline and final x-rays were taken.  Implant was properly positioned in both planes and there were no complicating features.  The platysma was then closed with interrupted 2-0 Vicryl sutures and the skin with 3-0 Monocryl.  Steri-Strips and dry dressing and a collar were applied and the patient was ultimately extubated and transferred to the PACU without incident.  The end of the case all needle sponge counts were correct.

## 2019-01-31 NOTE — Transfer of Care (Signed)
Immediate Anesthesia Transfer of Care Note  Patient: [name] [name]  Procedure(s) Performed: ANTERIOR CERVICAL DECOMPRESSION/DISCECTOMY FUSION C5-6 (N/A )  Patient Location: PACU  Anesthesia Type:General  Level of Consciousness: awake, alert , oriented and sedated  Airway & Oxygen Therapy: Patient Spontanous Breathing and Patient connected to nasal cannula oxygen  Post-op Assessment: Report given to RN, Post -op Vital signs reviewed and stable and Patient moving all extremities  Post vital signs: Reviewed and stable  Last Vitals:  Vitals Value Taken Time  BP 144/65 01/31/19 1038  Temp    Pulse 65 01/31/19 1041  Resp 12 01/31/19 1041  SpO2 99 % 01/31/19 1041  Vitals shown include unvalidated device data.  Last Pain:  Vitals:   01/31/19 0729  PainSc: 9       Patients Stated Pain Goal: 2 (44/46/19 0122)  Complications: No apparent anesthesia complications

## 2019-02-01 DIAGNOSIS — M50122 Cervical disc disorder at C5-C6 level with radiculopathy: Secondary | ICD-10-CM | POA: Diagnosis not present

## 2019-02-01 LAB — GLUCOSE, CAPILLARY: Glucose-Capillary: 209 mg/dL — ABNORMAL HIGH (ref 70–99)

## 2019-02-01 MED ORDER — ALUM & MAG HYDROXIDE-SIMETH 200-200-20 MG/5ML PO SUSP
30.0000 mL | ORAL | Status: DC | PRN
  Administered 2019-02-01: 01:00:00 30 mL via ORAL
  Filled 2019-02-01: qty 30

## 2019-02-01 NOTE — Progress Notes (Signed)
   Subjective:  Patient reports pain as mild.  No complaints.  Sore throat but denies dysphagia.    Objective:   VITALS:   Vitals:   01/31/19 1950 01/31/19 2320 02/01/19 0308 02/01/19 0741  BP: (!) 128/54 (!) 145/64 128/60 124/61  Pulse: 64 79 70 99  Resp: 18 18 18 16   Temp: 97.9 F (36.6 C) 98.6 F (37 C) 98.6 F (37 C) 100.1 F (37.8 C)  TempSrc: Oral Oral Oral Oral  SpO2: 97% 98% 98% 97%  Weight:      Height:        Neurologically intact Incision: dressing C/D/I c-collar in place   Lab Results  Component Value Date   WBC 6.1 01/28/2019   HGB 12.4 01/28/2019   HCT 37.5 01/28/2019   MCV 95.4 01/28/2019   PLT 206 01/28/2019   BMET    Component Value Date/Time   NA 141 01/28/2019 1451   K 3.9 01/28/2019 1451   CL 105 01/28/2019 1451   CO2 28 01/28/2019 1451   GLUCOSE 175 (H) 01/28/2019 1451   BUN <5 (L) 01/28/2019 1451   CREATININE 0.61 01/28/2019 1451   CREATININE 0.63 12/07/2012 1445   CALCIUM 9.1 01/28/2019 1451   GFRNONAA >60 01/28/2019 1451   GFRNONAA >89 12/07/2012 1445   GFRAA >60 01/28/2019 1451   GFRAA >89 12/07/2012 1445     Assessment/Plan: 1 Day Post-Op   Active Problems:   Cervical disc herniation   Up with therapy Home today Follow up with Dr. Remigio Eisenmenger 02/01/2019, 8:49 AM   Geralynn Rile, MD [phone]

## 2019-02-01 NOTE — Evaluation (Signed)
Occupational Therapy Evaluation Patient Details Name: [name] [name] MRN: [id number] DOB: 1951-07-02 Today's Date: 02/01/2019    History of Present Illness 68 yo female C5-6 Fusion 7/2. PMH includes:  COPD, CVA, MI, HTN, HLD, CHF, Asthma, DM2, BL TKA, R RTC sx,  A fib ablation.    Clinical Impression   This 68 y/o female presents with the above. PTA pt reports independence with ADL and functional mobility. Pt currently performing functional mobility in room and hallway without AD and overall minguard-supervision. She currently requires minguard-minA for seated UB and LB ADL. Pt also with bil UE weakness and decreased fine motor strength/coordination. Issued pt theraputty HEP during this session and reviewed. Educated pt re: cervical precautions, brace management, safety and compensatory strategies for performing ADL and functional transfers; pt requiring mod cues to maintain precautions during functional tasks this session. She will benefit from continued OT services while in acute setting to maximize her safety and independence with ADL and mobility. Will follow.      Follow Up Recommendations  No OT follow up;Supervision/Assistance - 24 hour    Equipment Recommendations  None recommended by OT           Precautions / Restrictions Precautions Precautions: Cervical Precaution Booklet Issued: Yes (comment) Precaution Comments: reviewed precautions with pt as pt unable to recall from recent PT session Required Braces or Orthoses: Cervical Brace Restrictions Weight Bearing Restrictions: No      Mobility Bed Mobility               General bed mobility comments: OOB at entry  Transfers Overall transfer level: Needs assistance Equipment used: None Transfers: Sit to/from Stand Sit to Stand: Min guard              Balance Overall balance assessment: Mild deficits observed, not formally tested                                         ADL either  performed or assessed with clinical judgement   ADL Overall ADL's : Needs assistance/impaired Eating/Feeding: Modified independent;Sitting   Grooming: Min guard;Standing   Upper Body Bathing: Min guard;Sitting   Lower Body Bathing: Min guard;Sit to/from stand;Cueing for compensatory techniques;Cueing for safety   Upper Body Dressing : Minimal assistance;Sitting Upper Body Dressing Details (indicate cue type and reason): assist to adjust cervical collar this session Lower Body Dressing: Minimal assistance;Sit to/from stand Lower Body Dressing Details (indicate cue type and reason): pt able to perform figure 4 technique without difficulty Toilet Transfer: Min guard;Supervision/safety;Ambulation Toilet Transfer Details (indicate cue type and reason): simulated via transfer to/from EOB, room and hallway level mobility Toileting- Clothing Manipulation and Hygiene: Min guard;Sit to/from stand       Functional mobility during ADLs: Min guard;Supervision/safety General ADL Comments: pt requires cues to adhere to precautions during functional tasks today                         Pertinent Vitals/Pain Pain Assessment: Faces Faces Pain Scale: Hurts little more Pain Location: incisional Pain Descriptors / Indicators: Aching Pain Intervention(s): Monitored during session;Limited activity within patient's tolerance     Hand Dominance (both)   Extremity/Trunk Assessment Upper Extremity Assessment Upper Extremity Assessment: Generalized weakness;RUE deficits/detail;LUE deficits/detail RUE Deficits / Details: noted tremors with attempts at finger opposition, pt reports difficulty holding items intermittently  RUE Coordination: decreased fine  motor LUE Deficits / Details: noted tremors with attempts at finger opposition, pt reports difficulty holding items intermittently; pt reports rotator cuff injury and plans for shoulder surgery sometime this fall LUE Coordination: decreased fine  motor   Lower Extremity Assessment Lower Extremity Assessment: Defer to PT evaluation   Cervical / Trunk Assessment Cervical / Trunk Assessment: Other exceptions Cervical / Trunk Exceptions: s/p cervical sx   Communication Communication Communication: No difficulties   Cognition Arousal/Alertness: Awake/alert Behavior During Therapy: WFL for tasks assessed/performed Overall Cognitive Status: No family/caregiver present to determine baseline cognitive functioning                                 General Comments: pt very easily distracted, decreased STM and recall of precautions, requires cues to maintain during functional tasks   General Comments       Exercises Exercises: Other exercises Other Exercises Other Exercises: issued pt yellow theraputty (soft) and educated in pt putty HEP for bil hand strengthening, pt return demonstrating with min cues   Shoulder Instructions      Home Living Family/patient expects to be discharged to:: Private residence Living Arrangements: Spouse/significant other Available Help at Discharge: Family;Available 24 hours/day Type of Home: House Home Access: Level entry     Home Layout: One level(basement )     Bathroom Shower/Tub: Teacher, early years/pre: Standard Bathroom Accessibility: Yes   Home Equipment: None          Prior Functioning/Environment Level of Independence: Independent                 OT Problem List: Decreased activity tolerance;Impaired balance (sitting and/or standing);Decreased cognition;Decreased safety awareness;Pain;Impaired UE functional use;Decreased knowledge of precautions;Decreased coordination;Decreased strength      OT Treatment/Interventions: Therapeutic exercise;Self-care/ADL training;Energy conservation;DME and/or AE instruction;Therapeutic activities;Patient/family education;Balance training;Cognitive remediation/compensation    OT Goals(Current goals can be found in  the care plan section) Acute Rehab OT Goals Patient Stated Goal: go home OT Goal Formulation: With patient Time For Goal Achievement: 02/15/19 Potential to Achieve Goals: Good  OT Frequency: Min 2X/week   Barriers to D/C:            Co-evaluation              AM-PAC OT "6 Clicks" Daily Activity     Outcome Measure Help from another person eating meals?: A Little Help from another person taking care of personal grooming?: A Little Help from another person toileting, which includes using toliet, bedpan, or urinal?: A Little Help from another person bathing (including washing, rinsing, drying)?: A Little Help from another person to put on and taking off regular upper body clothing?: A Little Help from another person to put on and taking off regular lower body clothing?: A Little 6 Click Score: 18   End of Session Equipment Utilized During Treatment: Cervical collar Nurse Communication: Mobility status  Activity Tolerance: Patient tolerated treatment well Patient left: with call bell/phone within reach;Other (comment)(seated EOB)  OT Visit Diagnosis: Other abnormalities of gait and mobility (R26.89);Pain Pain - part of body: (cerivcal, incisional)                Time: 6283-6629 OT Time Calculation (min): 28 min Charges:  OT General Charges $OT Visit: 1 Visit OT Evaluation $OT Eval Low Complexity: 1 Low OT Treatments $Self Care/Home Management : 8-22 mins  Lou Cal, OT Supplemental Rehabilitation Services Pager [phone] Office [phone]  Raymondo Band 02/01/2019, 11:17 AM

## 2019-02-01 NOTE — Progress Notes (Signed)
Patient not compliant to safety measures and spinal precautions. Educated on the important of following instructions. Assessing incision site and it looks swollen and patient c/o of severe pain to incision site. RN medicated patient and applied cold therapy. Pt  reassured.

## 2019-02-01 NOTE — Evaluation (Signed)
Physical Therapy Evaluation Patient Details Name: [name] [name] MRN: [id number] DOB: 01/20/1951 Today's Date: 02/01/2019   History of Present Illness  68 yo female C5-6 Fusion 7/2. PMH includes:  COPD, CVA, MI, HTN, HLD, CHF, Asthma, DM2, BL TKA, R RTC sx,  A fib ablation.     Clinical Impression  Patient is s/p above surgery resulting in functional limitations due to the deficits listed below (see PT Problem List). PTA, pt independent at home living with husband. Today, ambulating unit and stairs with supervision. Reviewed precautions.  Patient will benefit from skilled PT to increase their independence and safety with mobility to allow discharge to the venue listed below.        Follow Up Recommendations Follow surgeon's recommendation for DC plan and follow-up therapies    Equipment Recommendations       Recommendations for Other Services       Precautions / Restrictions Precautions Precautions: Cervical Precaution Booklet Issued: Yes (comment) Required Braces or Orthoses: Cervical Brace Restrictions Weight Bearing Restrictions: No      Mobility  Bed Mobility               General bed mobility comments: OOB at entry  Transfers Overall transfer level: Needs assistance Equipment used: None Transfers: Sit to/from Stand Sit to Stand: Min guard            Ambulation/Gait Ambulation/Gait assistance: Supervision Gait Distance (Feet): 150 Feet Assistive device: None Gait Pattern/deviations: Step-through pattern Gait velocity: decreased   General Gait Details: no overt LOB, no assistance needed  Stairs Stairs: Yes Stairs assistance: Min guard Stair Management: One rail Left;Step to pattern Number of Stairs: 5 General stair comments: 5 stairs with step to pattern guarding needed. informed to have husband assit her if she were to go down to basement   Wheelchair Mobility    Modified Rankin (Stroke Patients Only)       Balance Overall balance  assessment: Mild deficits observed, not formally tested                                           Pertinent Vitals/Pain Pain Assessment: Faces Faces Pain Scale: Hurts a little bit Pain Descriptors / Indicators: Aching Pain Intervention(s): Limited activity within patient's tolerance    Home Living Family/patient expects to be discharged to:: Private residence Living Arrangements: Spouse/significant other Available Help at Discharge: Family;Available 24 hours/day Type of Home: House Home Access: Level entry     Home Layout: One level(basement ) Home Equipment: None      Prior Function Level of Independence: Independent               Hand Dominance        Extremity/Trunk Assessment   Upper Extremity Assessment Upper Extremity Assessment: Defer to OT evaluation    Lower Extremity Assessment Lower Extremity Assessment: Overall WFL for tasks assessed       Communication   Communication: No difficulties  Cognition Arousal/Alertness: Awake/alert Behavior During Therapy: WFL for tasks assessed/performed Overall Cognitive Status: Within Functional Limits for tasks assessed                                        General Comments      Exercises     Assessment/Plan    PT  Assessment Patient needs continued PT services  PT Problem List Decreased strength       PT Treatment Interventions      PT Goals (Current goals can be found in the Care Plan section)  Acute Rehab PT Goals Patient Stated Goal: go home PT Goal Formulation: With patient    Frequency     Barriers to discharge        Co-evaluation               AM-PAC PT "6 Clicks" Mobility  Outcome Measure Help needed turning from your back to your side while in a flat bed without using bedrails?: None Help needed moving from lying on your back to sitting on the side of a flat bed without using bedrails?: None Help needed moving to and from a bed to a chair  (including a wheelchair)?: None Help needed standing up from a chair using your arms (e.g., wheelchair or bedside chair)?: A Little Help needed to walk in hospital room?: A Little Help needed climbing 3-5 steps with a railing? : A Little 6 Click Score: 21    End of Session Equipment Utilized During Treatment: Gait belt;Cervical collar Activity Tolerance: Patient tolerated treatment well;Patient limited by pain Patient left: in bed;with call bell/phone within reach Nurse Communication: Mobility status PT Visit Diagnosis: Unsteadiness on feet (R26.81)    Time: 6503-5465 PT Time Calculation (min) (ACUTE ONLY): 24 min   Charges:   PT Evaluation $PT Eval Moderate Complexity: 1 Mod PT Treatments $Gait Training: 8-22 mins        Reinaldo Berber, PT, DPT Acute Rehabilitation Services Pager: [phone] Office: [place] 02/01/2019, 9:35 AM

## 2019-02-01 NOTE — Progress Notes (Addendum)
Patient is discharged from room 3C08 at this time. Alert and in stable condition. IV site d/c'd and instructions read to patient with understanding verbalized. Left unit via wheelchair with all belongings at side. 

## 2019-02-04 ENCOUNTER — Encounter (HOSPITAL_COMMUNITY): Payer: Self-pay | Admitting: Orthopedic Surgery

## 2019-02-04 MED FILL — Thrombin (Recombinant) For Soln 20000 Unit: CUTANEOUS | Qty: 1 | Status: AC

## 2019-02-04 NOTE — Discharge Summary (Signed)
Patient ID: [name] [name] MRN: [id number] DOB/AGE: 1950/10/04 68 y.o.  Admit date: 01/31/2019 Discharge date: 02/04/2019  Admission Diagnoses:  Active Problems:   Cervical disc herniation   Discharge Diagnoses:  Active Problems:   Cervical disc herniation  status post Procedure(s): ANTERIOR CERVICAL DECOMPRESSION/DISCECTOMY FUSION C5-6  Past Medical History:  Diagnosis Date  . Anemia    hx of years ago   . Anxiety   . Aortic valve disorders   . Arthritis   . Asthma   . Barrett esophagus   . Bipolar disorder ([place])   . CHF (congestive heart failure) ([place])   . Chronic bronchitis ([place])   . Chronic kidney disease    "I don't have but 1" (08/03/2016)  . COPD (chronic obstructive pulmonary disease) ([place])    "just have a touch" (08/03/2016)  . Depression   . Esophageal reflux   . Esophageal stricture   . Fibromyalgia    "hands, knees, elbows; back; think it's in my hips" (08/03/2016)  . GAD (generalized anxiety disorder)   . GERD (gastroesophageal reflux disease)   . Hiatal hernia   . History of blood transfusion 1970s; 01/08/2013   "low blood count"  . History of kidney stones 01-08-13   past hx.  . History of nuclear stress test 01/2016   low risk study  . History of stomach ulcers    "some have bleed"  . Hyperlipemia   . Hypertension 12/07/2012  . Insomnia, unspecified   . Myocardial infarction [facility] 01-08-13   '06-Chest pain-no stent-dx. MI-stress related  . Osteoporosis   . Personal history of colonic polyps 09/2010   TUBULAR ADENOMAS (X3); NEGATIVE FOR HIGH GRADE DYSPLASIA OR MALIGNANCY.  [name] Pneumonia    "several times" (08/03/2016)  . Stroke [facility])    "2 or 3"; remains with some right sided weaknes (08/03/2016)  . Takotsubo syndrome   . Tremor, essential 03/20/2017  . Type II diabetes mellitus ([place])   . Urinary frequency    AND INCONTINENCE    Surgeries: Procedure(s): ANTERIOR CERVICAL DECOMPRESSION/DISCECTOMY FUSION C5-6 on 01/31/2019   Consultants:  None  Discharged Condition: Improved  Hospital Course: [name] [name] is an 68 y.o. female who was admitted 01/31/2019 for operative treatment of cervical herniated disc with radiculopathy. Patient failed conservative treatments (please see the history and physical for the specifics) and had severe unremitting pain that affects sleep, daily activities and work/hobbies. After pre-op clearance, the patient was taken to the operating room on 01/31/2019 and underwent  Procedure(s): ANTERIOR CERVICAL DECOMPRESSION/DISCECTOMY FUSION C5-6.    Patient was given perioperative antibiotics:  Anti-infectives (From admission, onward)   Start     Dose/Rate Route Frequency Ordered Stop   01/31/19 2000  vancomycin (VANCOCIN) IVPB 1000 mg/200 mL premix     1,000 mg 200 mL/hr over 60 Minutes Intravenous  Once 01/31/19 1315 02/01/19 0042   01/31/19 0709  vancomycin (VANCOCIN) IVPB 1000 mg/200 mL premix     1,000 mg 200 mL/hr over 60 Minutes Intravenous 60 min pre-op 01/31/19 0709 01/31/19 0807   01/31/19 0640  vancomycin (VANCOCIN) 1-5 GM/200ML-% IVPB    Note to Pharmacy: Laurita Quint   : cabinet override      01/31/19 0640 01/31/19 0807       Patient was given sequential compression devices and early ambulation to prevent DVT.   Patient benefited maximally from hospital stay and there were no complications. At the time of discharge, the patient was urinating/moving their bowels without difficulty, tolerating a regular  diet, pain is controlled with oral pain medications and they have been cleared by PT/OT.   Recent vital signs: No data found.   Recent laboratory studies: No results for input(s): WBC, HGB, HCT, PLT, NA, K, CL, CO2, BUN, CREATININE, GLUCOSE, INR, CALCIUM in the last 72 hours.  Invalid input(s): PT, 2   Discharge Medications:   Allergies as of 02/01/2019      Reactions   Bee Venom Anaphylaxis   Ceclor [cefaclor] Other (See Comments)   Reaction=burning all over   Penicillins  Anaphylaxis   "CLOSES OFF MY BREATHING" Has patient had a PCN reaction causing immediate rash, facial/tongue/throat swelling, SOB or lightheadedness with hypotension: Yes Has patient had a PCN reaction causing severe rash involving mucus membranes or skin necrosis: No Has patient had a PCN reaction that required hospitalization No Has patient had a PCN reaction occurring within the last 10 years: No If all of the above answers are "NO", then may proceed with Cephalosporin use.   Pneumococcal Vaccines Other (See Comments)   PP-23 vaccine; had BIG local red reaction with heat.    Codeine Nausea And Vomiting   Cortisone Hives   All over body   Boniva [ibandronate Sodium] Other (See Comments)   Jaw popping      Medication List    STOP taking these medications   acetaminophen 325 MG tablet Commonly known as: TYLENOL   HYDROcodone-acetaminophen 5-325 MG tablet Commonly known as: NORCO/VICODIN     TAKE these medications   ALPRAZolam 0.5 MG tablet Commonly known as: XANAX Take 1 mg by mouth 3 (three) times daily as needed for anxiety or sleep. 6/25 pt reports PCP increasing dose to 1 mg 3 times daily PRN   atorvastatin 80 MG tablet Commonly known as: LIPITOR Take 80 mg by mouth daily.   azelastine 0.1 % nasal spray Commonly known as: ASTELIN Place 1 spray into both nostrils at bedtime.   beta carotene 25000 UNIT capsule Take 25,000 Units by mouth daily.   cholecalciferol 1000 units tablet Commonly known as: VITAMIN D Take 1,000 Units by mouth daily.   Dexilant 60 MG capsule Generic drug: dexlansoprazole Take 60 mg by mouth daily.   docusate sodium 100 MG capsule Commonly known as: COLACE Take 100 mg by mouth 2 (two) times daily.   Eliquis 5 MG Tabs tablet Generic drug: apixaban Take 5 mg by mouth 2 (two) times daily.   EPINEPHrine 0.3 mg/0.3 mL Soaj injection Commonly known as: EpiPen 2-Pak USE AS DIRECTED   Flaxseed Oil 1200 MG Caps Take 1,200 mg by mouth at  bedtime.   furosemide 20 MG tablet Commonly known as: LASIX Take 1 tablet (20 mg total) by mouth daily.   gabapentin 300 MG capsule Commonly known as: NEURONTIN Take 300-600 mg by mouth See admin instructions. Take 300 mg by mouth in the morning and afternoon and 600 mg at bedtime   glipiZIDE 5 MG tablet Commonly known as: GLUCOTROL Take 5 mg by mouth 2 (two) times daily before a meal.   isosorbide mononitrate 30 MG 24 hr tablet Commonly known as: IMDUR Take 1 tablet (30 mg total) by mouth daily.   losartan 50 MG tablet Commonly known as: COZAAR TAKE 1 TABLET BY MOUTH EVERY DAY   methocarbamol 500 MG tablet Commonly known as: Robaxin Take 1 tablet (500 mg total) by mouth every 8 (eight) hours as needed for up to 5 days for muscle spasms.   montelukast 10 MG tablet Commonly known as: SINGULAIR Take  1 tablet (10 mg total) by mouth at bedtime.   ondansetron 4 MG tablet Commonly known as: Zofran Take 1 tablet (4 mg total) by mouth every 8 (eight) hours as needed for nausea or vomiting.   oxyCODONE-acetaminophen 10-325 MG tablet Commonly known as: Percocet Take 1 tablet by mouth every 6 (six) hours as needed for up to 5 days for pain.   PARoxetine 30 MG tablet Commonly known as: PAXIL Take 30 mg by mouth at bedtime.   ProAir HFA 108 (90 Base) MCG/ACT inhaler Generic drug: albuterol INHALE TWO PUFFS INTO THE LUNGS EVERY 6 HOURS AS NEEDED FOR WHEEZING OR SHORTNESS OF BREATH What changed: See the new instructions.   albuterol (2.5 MG/3ML) 0.083% nebulizer solution Commonly known as: PROVENTIL Take 3 mLs (2.5 mg total) by nebulization every 6 (six) hours as needed for wheezing. DX  491.9, J44.9 What changed: Another medication with the same name was changed. Make sure you understand how and when to take each.   Spiriva HandiHaler 18 MCG inhalation capsule Generic drug: tiotropium PLACE ONE CAPSULE IN INHALER AND INHALE DAILY What changed: See the new instructions.    vitamin B-12 1000 MCG tablet Commonly known as: CYANOCOBALAMIN Take 1,000 mcg by mouth daily.       Diagnostic Studies: Dg Cervical Spine 2-3 Views  Result Date: 01/31/2019 CLINICAL DATA:  Elective surgery EXAM: DG C-ARM 61-120 MIN; CERVICAL SPINE - 2-3 VIEW COMPARISON:  None similar FINDINGS: C5-6 ACDF. Hardware is in expected position. No evidence of fracture. IMPRESSION: Fluoroscopy for C5-6 ACDF.  No unexpected finding. Electronically Signed   By: Monte Fantasia M.D.   On: 01/31/2019 10:17   Dg C-arm 1-60 Min  Result Date: 01/31/2019 CLINICAL DATA:  Elective surgery EXAM: DG C-ARM 61-120 MIN; CERVICAL SPINE - 2-3 VIEW COMPARISON:  None similar FINDINGS: C5-6 ACDF. Hardware is in expected position. No evidence of fracture. IMPRESSION: Fluoroscopy for C5-6 ACDF.  No unexpected finding. Electronically Signed   By: Monte Fantasia M.D.   On: 01/31/2019 10:17    Discharge Instructions    Call MD / Call 911   Complete by: As directed    If you experience chest pain or shortness of breath, CALL 911 and be transported to the hospital emergency room.  If you develope a fever above 101 F, pus (white drainage) or increased drainage or redness at the wound, or calf pain, call your surgeon's office.   Constipation Prevention   Complete by: As directed    Drink plenty of fluids.  Prune juice may be helpful.  You may use a stool softener, such as Colace (over the counter) 100 mg twice a day.  Use MiraLax (over the counter) for constipation as needed.   Diet - low sodium heart healthy   Complete by: As directed    Incentive spirometry RT   Complete by: As directed    Increase activity slowly as tolerated   Complete by: As directed       Follow-up Information    Melina Schools, MD. Schedule an appointment as soon as possible for a visit in 2 weeks.   Specialty: Orthopedic Surgery Why: For suture removal, If symptoms worsen, For wound re-check Contact information: [address] [place] [address] [phone]           Discharge Plan:  discharge to home  Disposition: stable    Signed: Yvonne Kendall Ward for [facility] PA-C [facility] [phone] 02/04/2019, 11:59 AM

## 2019-02-05 ENCOUNTER — Encounter (HOSPITAL_COMMUNITY): Payer: Self-pay | Admitting: Orthopedic Surgery

## 2019-02-05 ENCOUNTER — Ambulatory Visit: Payer: Medicare Other | Admitting: Cardiology

## 2019-02-05 NOTE — OR Nursing (Signed)
Late Entry updated chart with correct information.

## 2019-02-13 ENCOUNTER — Encounter (HOSPITAL_COMMUNITY): Payer: Self-pay | Admitting: Orthopedic Surgery

## 2019-02-15 ENCOUNTER — Other Ambulatory Visit: Payer: Self-pay | Admitting: Cardiology

## 2019-02-15 NOTE — Telephone Encounter (Signed)
68yof, 73.3kg, scr0.61(01/28/19), lovw/camnitz(05/09/18)

## 2019-03-10 ENCOUNTER — Other Ambulatory Visit: Payer: Self-pay | Admitting: Cardiology

## 2019-03-19 ENCOUNTER — Other Ambulatory Visit: Payer: Self-pay

## 2019-03-19 ENCOUNTER — Ambulatory Visit: Payer: Medicare Other | Attending: Orthopedic Surgery | Admitting: Physical Therapy

## 2019-03-19 DIAGNOSIS — M542 Cervicalgia: Secondary | ICD-10-CM | POA: Insufficient documentation

## 2019-03-19 DIAGNOSIS — R293 Abnormal posture: Secondary | ICD-10-CM | POA: Insufficient documentation

## 2019-03-19 NOTE — Therapy (Signed)
[facility] [place], [place], [address] Phone: [phone]   Fax:  [phone]  Physical Therapy Evaluation  Patient Details  Name: [name] [name] MRN: [id number] Date of Birth: 12/13/1950 Referring Provider (PT): Melina Schools MD   Encounter Date: 03/19/2019  PT End of Session - 03/19/19 1520    Visit Number  1    Number of Visits  12    Date for PT Re-Evaluation  04/30/19    Authorization Type  FOTO AT LEAST EVERY 5TH VISIT.  KX MODIFIER AFTER 15 VISITS.  PROGRESS NOTE AT 10TH VISIT.    PT Start Time  0230    PT Stop Time  0316    PT Time Calculation (min)  46 min    Activity Tolerance  Patient tolerated treatment well;Patient limited by pain    Behavior During Therapy  [facility] for tasks assessed/performed       Past Medical History:  Diagnosis Date  . Anemia    hx of years ago   . Anxiety   . Aortic valve disorders   . Arthritis   . Asthma   . Barrett esophagus   . Bipolar disorder ([place])   . CHF (congestive heart failure) ([place])   . Chronic bronchitis ([place])   . Chronic kidney disease    "I don't have but 1" (08/03/2016)  . COPD (chronic obstructive pulmonary disease) ([place])    "just have a touch" (08/03/2016)  . Depression   . Esophageal reflux   . Esophageal stricture   . Fibromyalgia    "hands, knees, elbows; back; think it's in my hips" (08/03/2016)  . GAD (generalized anxiety disorder)   . GERD (gastroesophageal reflux disease)   . Hiatal hernia   . History of blood transfusion 1970s; 01/08/2013   "low blood count"  . History of kidney stones 01-08-13   past hx.  . History of nuclear stress test 01/2016   low risk study  . History of stomach ulcers    "some have bleed"  . Hyperlipemia   . Hypertension 12/07/2012  . Insomnia, unspecified   . Myocardial infarction [facility]) 01-08-13   '06-Chest pain-no stent-dx. MI-stress related  . Osteoporosis   . Personal history of colonic polyps 09/2010   TUBULAR  ADENOMAS (X3); NEGATIVE FOR HIGH GRADE DYSPLASIA OR MALIGNANCY.  [name] Pneumonia    "several times" (08/03/2016)  . Stroke [facility])    "2 or 3"; remains with some right sided weaknes (08/03/2016)  . Takotsubo syndrome   . Tremor, essential 03/20/2017  . Type II diabetes mellitus ([place])   . Urinary frequency    AND INCONTINENCE    Past Surgical History:  Procedure Laterality Date  . ANTERIOR CERVICAL DECOMP/DISCECTOMY FUSION N/A 01/31/2019   Procedure: ANTERIOR CERVICAL DECOMPRESSION/DISCECTOMY FUSION C5-6;  Surgeon: Melina Schools, MD;  Location: [facility];  Service: Orthopedics;  Laterality: N/A;  3 hrs  . ATRIAL FIBRILLATION ABLATION  08/03/2016  . CARDIAC CATHETERIZATION     normal per Dr Acie Fredrickson in note dated 02/06/12   . CARDIAC CATHETERIZATION     "I've had 2 or 3" (08/03/2016)  . CARPAL TUNNEL RELEASE Right   . CATARACT EXTRACTION W/ INTRAOCULAR LENS  IMPLANT, BILATERAL    . COLONOSCOPY W/ BIOPSIES AND POLYPECTOMY    . DILATION AND CURETTAGE OF UTERUS     S/P miscarriage  . ELECTROPHYSIOLOGIC STUDY N/A 08/03/2016   Procedure: Atrial Fibrillation Ablation;  Surgeon: Will Meredith Leeds, MD;  Location: [facility];  Service: Cardiovascular;  Laterality: N/A;  . ESOPHAGEAL DILATION  01-08-13   2 yrs ago  . HEEL SPUR SURGERY Left 2005  . KNEE ARTHROSCOPY  02/28/2012   Procedure: ARTHROSCOPY KNEE;  Surgeon: Tobi Bastos, MD;  Location: [facility];  Service: Orthopedics;  Laterality: Left;  . LAPAROSCOPIC CHOLECYSTECTOMY  03/2002  . SHOULDER OPEN ROTATOR CUFF REPAIR Right 2011  . TONSILLECTOMY AND ADENOIDECTOMY  1965  . TOTAL KNEE ARTHROPLASTY Left 01/17/2013   Procedure: LEFT TOTAL KNEE ARTHROPLASTY;  Surgeon: Tobi Bastos, MD;  Location: [facility];  Service: Orthopedics;  Laterality: Left;  . TOTAL KNEE ARTHROPLASTY Right 06/19/2014   Procedure: RIGHT TOTAL KNEE ARTHROPLASTY;  Surgeon: Tobi Bastos, MD;  Location: [facility];  Service: Orthopedics;  Laterality: Right;  . TUBAL LIGATION    .  VAGINAL HYSTERECTOMY     "partial"  . WRIST FLEXION TENDON TENOTOMIES AND PROXIMAL CORPECTOMY W/ WRIST ARTHRODESIS&ILIAC CREST BONE GRAFT      There were no vitals filed for this visit.   Subjective Assessment - 03/19/19 1521    Subjective  COVID-19 screen performed prior to patient entering clinic.  The patient underwent a C5-^ ACDF on 01/31/19.  She is pleased with her outcome thus far.  Her pain at rest today is a 2/10.  Trying to lift things increases her pain and not moving decreases her pain.    Pertinent History  AF, COPD, CVA, TIA, bilateral TKA, right RTC repair, OP, DM and CTS.    Patient Stated Goals  Get back to normal.    Currently in Pain?  Yes    Pain Score  2     Pain Location  Neck    Pain Orientation  Right;Left    Pain Descriptors / Indicators  Aching;Dull    Pain Type  Surgical pain    Pain Onset  More than a month ago    Pain Frequency  Constant    Aggravating Factors   See above.    Pain Relieving Factors  See above.         [facility] PT Assessment - 03/19/19 0001      Assessment   Medical Diagnosis  ACDF C5-6.    Referring Provider (PT)  Melina Schools MD    Onset Date/Surgical Date  --   01/31/19(surgery date).     Precautions   Precaution Comments  PLEASE FOLLOW CERVICAL FUSION PROTOCOL.      Restrictions   Weight Bearing Restrictions  No      Balance Screen   Has the patient fallen in the past 6 months  Yes    How many times?  --   3.   Has the patient had a decrease in activity level because of a fear of falling?   No    Is the patient reluctant to leave their home because of a fear of falling?   No      Home Environment   Living Environment  Private residence      Prior Function   Level of Independence  Independent      Observation/Other Assessments   Focus on Therapeutic Outcomes (FOTO)   62% limitation.      Posture/Postural Control   Posture/Postural Control  Postural limitations    Postural Limitations  Rounded Shoulders;Forward head       Deep Tendon Reflexes   DTR Assessment Site  Biceps;Brachioradialis;Triceps    Biceps DTR  0    Brachioradialis DTR  1+  Triceps DTR  1+      AROM   Overall AROM Comments  Bilateral shoulder flexion= 135 degrees.  Left active cervical rotation=52 degrees and right rotation= 45 degrees.      Strength   Overall Strength Comments  Normal bilateral elbow strength.      Palpation   Palpation comment  Tender to palpation over both UT's right > left which has an increase in tone when contralaterally compared.      Ambulation/Gait   Gait Comments  WNL.                Objective measurements completed on examination: See above findings.      [facility] Adult PT Treatment/Exercise - 03/19/19 0001      Modalities   Modalities  Electrical Stimulation;Moist Heat      Moist Heat Therapy   Number Minutes Moist Heat  20 Minutes    Moist Heat Location  Cervical      Electrical Stimulation   Electrical Stimulation Location  Bilateral UT's.    Electrical Stimulation Action  Pre-mod (low-level).    Electrical Stimulation Parameters  80-150 Hz x 20 minutes.    Electrical Stimulation Goals  Tone;Pain                  PT Long Term Goals - 03/19/19 1529      PT LONG TERM GOAL #1   Title  Independent with a HEP.    Time  6    Period  Weeks    Status  New      PT LONG TERM GOAL #2   Title  Increase active cervical rotation to 60 degrees+ so patient can turn head more easily while driving.    Time  6    Period  Weeks    Status  New      PT LONG TERM GOAL #3   Title  Perform ADL's with pain not > 2/10.    Time  6    Period  Weeks    Status  New             Plan - 03/19/19 1524    Clinical Impression Statement  The patient presents to OPPT s/p C5-6 ACDF performed on 01/31/19.  She is doing very well thus far.  She has an expected loss of cervical range of motion currently.  She has palpable pain over both UT's with increased tone and tenderness on the  right.  Patient will benefit from skilled physical therapy intervention to address deficits and pain.    Personal Factors and Comorbidities  Comorbidity 1;Comorbidity 2    Comorbidities  AF, COPD, CVA, TIA, bilateral TKA, right RTC repair, OP, DM and CTS.    Stability/Clinical Decision Making  Stable/Uncomplicated    Clinical Decision Making  Low    PT Frequency  2x / week    PT Duration  6 weeks    PT Treatment/Interventions  ADLs/Self Care Home Management;Electrical Stimulation;Cryotherapy;Moist Heat;Ultrasound;Therapeutic exercise;Therapeutic activities;Patient/family education;Manual techniques;Passive range of motion    PT Next Visit Plan  HMP/e'stim over bilateral UT's, STW/M, pulsed U/S over UT muscle belly okay.  Progress with cervical fusion protocol.    Consulted and Agree with Plan of Care  Patient       Patient will benefit from skilled therapeutic intervention in order to improve the following deficits and impairments:  Pain, Postural dysfunction, Increased muscle spasms, Decreased activity tolerance, Decreased range of motion  Visit Diagnosis: 1. Cervicalgia  2. Abnormal posture        Problem List Patient Active Problem List   Diagnosis Date Noted  . Cervical disc herniation 01/31/2019  . Stomatitis 07/03/2018  . TMJ (sprain of temporomandibular joint), initial encounter 07/03/2018  . Tremor, essential 03/20/2017  . Syncope and collapse 03/20/2017  . AF (atrial fibrillation) ([place]) 08/03/2016  . Hypokalemia 02/25/2016  . Anxiety 02/24/2016  . Stroke-like symptoms 02/24/2016  . Weakness 01/26/2016  . Atrial fibrillation ([place]) 01/26/2016  . History of colon polyps 01/28/2015  . Dysphagia 01/28/2015  . History of esophageal stricture 01/28/2015  . Primary osteoarthritis of right knee 06/24/2014  . Acute systolic heart failure ([place]) 06/23/2014  . Takotsubo syndrome 06/21/2014  . Elevated troponin 06/20/2014  . Atrial fibrillation with RVR ([place]) 06/20/2014  .  Congestive dilated cardiomyopathy ([place]) 06/20/2014  . Demand ischemia of myocardium ([place]) 06/20/2014  . 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 Respiratory failure requiring intubation ([place]) 06/19/2014  . Acute pulmonary edema ([place]) 06/19/2014  . Acute respiratory failure with hypoxia ([place]) 06/19/2014  . Hypoxemia   . Arterial hypotension   . Gait instability 10/26/2013  . Stroke ([place]) 10/26/2013  . TIA (transient ischemic attack) 10/26/2013  . Tobacco user 05/20/2013  . Postoperative anemia due to acute blood loss 01/18/2013  . Osteoarthritis of left knee 01/17/2013  . Essential hypertension 12/07/2012  . Peripheral edema 12/07/2012  . Diabetes mellitus with complication ([place]) 17/00/1749  . COPD mixed type ([place]) 08/05/2012  . Seasonal and perennial allergic rhinitis 08/05/2012  . Urticaria 08/05/2012  . Chest pain 02/06/2012  . Hyperlipidemia 02/06/2012    Khalib Fendley, [place] MPT 03/19/2019, 3:32 PM  [facility] [address] [place], [place], [address] Phone: [phone]   Fax:  [phone]  Name: [name] [name] MRN: [id number] Date of Birth: January 20, 1951

## 2019-03-22 ENCOUNTER — Ambulatory Visit: Payer: Medicare Other | Admitting: Physical Therapy

## 2019-03-22 ENCOUNTER — Other Ambulatory Visit: Payer: Self-pay

## 2019-03-22 DIAGNOSIS — M542 Cervicalgia: Secondary | ICD-10-CM

## 2019-03-22 DIAGNOSIS — R293 Abnormal posture: Secondary | ICD-10-CM

## 2019-03-22 NOTE — Therapy (Signed)
[facility] [place], [place], [address] Phone: [phone]   Fax:  [phone]  Physical Therapy Treatment  Patient Details  Name: [name] [name] MRN: [id number] Date of Birth: August 06, 1950 Referring Provider (PT): Melina Schools MD   Encounter Date: 03/22/2019  PT End of Session - 03/22/19 1301    Visit Number  2    Number of Visits  12    Date for PT Re-Evaluation  04/30/19    Authorization Type  FOTO AT LEAST EVERY 5TH VISIT.  KX MODIFIER AFTER 15 VISITS.  PROGRESS NOTE AT 10TH VISIT.    PT Start Time  1115    PT Stop Time  1207    PT Time Calculation (min)  52 min    Activity Tolerance  Patient tolerated treatment well;Patient limited by pain    Behavior During Therapy  [facility] for tasks assessed/performed       Past Medical History:  Diagnosis Date  . Anemia    hx of years ago   . Anxiety   . Aortic valve disorders   . Arthritis   . Asthma   . Barrett esophagus   . Bipolar disorder ([place])   . CHF (congestive heart failure) ([place])   . Chronic bronchitis ([place])   . Chronic kidney disease    "I don't have but 1" (08/03/2016)  . COPD (chronic obstructive pulmonary disease) ([place])    "just have a touch" (08/03/2016)  . Depression   . Esophageal reflux   . Esophageal stricture   . Fibromyalgia    "hands, knees, elbows; back; think it's in my hips" (08/03/2016)  . GAD (generalized anxiety disorder)   . GERD (gastroesophageal reflux disease)   . Hiatal hernia   . History of blood transfusion 1970s; 01/08/2013   "low blood count"  . History of kidney stones 01-08-13   past hx.  . History of nuclear stress test 01/2016   low risk study  . History of stomach ulcers    "some have bleed"  . Hyperlipemia   . Hypertension 12/07/2012  . Insomnia, unspecified   . Myocardial infarction [facility]) 01-08-13   '06-Chest pain-no stent-dx. MI-stress related  . Osteoporosis   . Personal history of colonic polyps 09/2010   TUBULAR  ADENOMAS (X3); NEGATIVE FOR HIGH GRADE DYSPLASIA OR MALIGNANCY.  [name] Pneumonia    "several times" (08/03/2016)  . Stroke [facility])    "2 or 3"; remains with some right sided weaknes (08/03/2016)  . Takotsubo syndrome   . Tremor, essential 03/20/2017  . Type II diabetes mellitus ([place])   . Urinary frequency    AND INCONTINENCE    Past Surgical History:  Procedure Laterality Date  . ANTERIOR CERVICAL DECOMP/DISCECTOMY FUSION N/A 01/31/2019   Procedure: ANTERIOR CERVICAL DECOMPRESSION/DISCECTOMY FUSION C5-6;  Surgeon: Melina Schools, MD;  Location: [place];  Service: Orthopedics;  Laterality: N/A;  3 hrs  . ATRIAL FIBRILLATION ABLATION  08/03/2016  . CARDIAC CATHETERIZATION     normal per Dr Acie Fredrickson in note dated 02/06/12   . CARDIAC CATHETERIZATION     "I've had 2 or 3" (08/03/2016)  . CARPAL TUNNEL RELEASE Right   . CATARACT EXTRACTION W/ INTRAOCULAR LENS  IMPLANT, BILATERAL    . COLONOSCOPY W/ BIOPSIES AND POLYPECTOMY    . DILATION AND CURETTAGE OF UTERUS     S/P miscarriage  . ELECTROPHYSIOLOGIC STUDY N/A 08/03/2016   Procedure: Atrial Fibrillation Ablation;  Surgeon: Will Meredith Leeds, MD;  Location: [place] [facility];  Service: Cardiovascular;  Laterality: N/A;  . ESOPHAGEAL DILATION  01-08-13   2 yrs ago  . HEEL SPUR SURGERY Left 2005  . KNEE ARTHROSCOPY  02/28/2012   Procedure: ARTHROSCOPY KNEE;  Surgeon: Tobi Bastos, MD;  Location: [facility];  Service: Orthopedics;  Laterality: Left;  . LAPAROSCOPIC CHOLECYSTECTOMY  03/2002  . SHOULDER OPEN ROTATOR CUFF REPAIR Right 2011  . TONSILLECTOMY AND ADENOIDECTOMY  1965  . TOTAL KNEE ARTHROPLASTY Left 01/17/2013   Procedure: LEFT TOTAL KNEE ARTHROPLASTY;  Surgeon: Tobi Bastos, MD;  Location: [facility];  Service: Orthopedics;  Laterality: Left;  . TOTAL KNEE ARTHROPLASTY Right 06/19/2014   Procedure: RIGHT TOTAL KNEE ARTHROPLASTY;  Surgeon: Tobi Bastos, MD;  Location: [facility];  Service: Orthopedics;  Laterality: Right;  . TUBAL LIGATION    .  VAGINAL HYSTERECTOMY     "partial"  . WRIST FLEXION TENDON TENOTOMIES AND PROXIMAL CORPECTOMY W/ WRIST ARTHRODESIS&ILIAC CREST BONE GRAFT      There were no vitals filed for this visit.  Subjective Assessment - 03/22/19 1244    Subjective  COVID-19 screen performed prior to patient entering clinic.  No new complaints.    Pertinent History  AF, COPD, CVA, TIA, bilateral TKA, right RTC repair, OP, DM and CTS.    Patient Stated Goals  Get back to normal.    Currently in Pain?  Yes    Pain Score  2     Pain Location  Neck    Pain Orientation  Right;Left    Pain Descriptors / Indicators  Aching;Dull    Pain Onset  More than a month ago                       [facility] Adult PT Treatment/Exercise - 03/22/19 0001      Modalities   Modalities  Electrical Stimulation;Moist Heat      Moist Heat Therapy   Number Minutes Moist Heat  20 Minutes    Moist Heat Location  Cervical      Electrical Stimulation   Electrical Stimulation Location  Bil UT's    Electrical Stimulation Action  Pre-mod (low-level)    Electrical Stimulation Parameters  80-150 Hz x 20 minutes.    Electrical Stimulation Goals  Tone;Pain      Manual Therapy   Manual Therapy  Soft tissue mobilization    Soft tissue mobilization  STW/M x 24 minutes to bilateral UT's.                  PT Long Term Goals - 03/19/19 1529      PT LONG TERM GOAL #1   Title  Independent with a HEP.    Time  6    Period  Weeks    Status  New      PT LONG TERM GOAL #2   Title  Increase active cervical rotation to 60 degrees+ so patient can turn head more easily while driving.    Time  6    Period  Weeks    Status  New      PT LONG TERM GOAL #3   Title  Perform ADL's with pain not > 2/10.    Time  6    Period  Weeks    Status  New            Plan - 03/22/19 1301    Clinical Impression Statement  Patient responded very well to STW/M to bilateral UT's to reduce tone.  She felt better after treatment.     Personal Factors and Comorbidities  Comorbidity 1;Comorbidity 2    Comorbidities  AF, COPD, CVA, TIA, bilateral TKA, right RTC repair, OP, DM and CTS.    Examination-Activity Limitations  Bathing    Stability/Clinical Decision Making  Stable/Uncomplicated    Rehab Potential  Good    PT Frequency  2x / week    PT Duration  6 weeks    PT Treatment/Interventions  ADLs/Self Care Home Management;Electrical Stimulation;Cryotherapy;Moist Heat;Ultrasound;Therapeutic exercise;Therapeutic activities;Patient/family education;Manual techniques;Passive range of motion    PT Next Visit Plan  HMP/e'stim over bilateral UT's, STW/M, pulsed U/S over UT muscle belly okay.  Progress with cervical fusion protocol.    Consulted and Agree with Plan of Care  Patient       Patient will benefit from skilled therapeutic intervention in order to improve the following deficits and impairments:  Pain, Postural dysfunction, Increased muscle spasms, Decreased activity tolerance, Decreased range of motion  Visit Diagnosis: Cervicalgia  Abnormal posture     Problem List Patient Active Problem List   Diagnosis Date Noted  . Cervical disc herniation 01/31/2019  . Stomatitis 07/03/2018  . TMJ (sprain of temporomandibular joint), initial encounter 07/03/2018  . Tremor, essential 03/20/2017  . Syncope and collapse 03/20/2017  . AF (atrial fibrillation) ([place]) 08/03/2016  . Hypokalemia 02/25/2016  . Anxiety 02/24/2016  . Stroke-like symptoms 02/24/2016  . Weakness 01/26/2016  . Atrial fibrillation ([place]) 01/26/2016  . History of colon polyps 01/28/2015  . Dysphagia 01/28/2015  . History of esophageal stricture 01/28/2015  . Primary osteoarthritis of right knee 06/24/2014  . Acute systolic heart failure ([place]) 06/23/2014  . Takotsubo syndrome 06/21/2014  . Elevated troponin 06/20/2014  . Atrial fibrillation with RVR ([place]) 06/20/2014  . Congestive dilated cardiomyopathy ([place]) 06/20/2014  . Demand ischemia of  myocardium ([place]) 06/20/2014  . 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 Respiratory failure requiring intubation ([place]) 06/19/2014  . Acute pulmonary edema ([place]) 06/19/2014  . Acute respiratory failure with hypoxia ([place]) 06/19/2014  . Hypoxemia   . Arterial hypotension   . Gait instability 10/26/2013  . Stroke ([place]) 10/26/2013  . TIA (transient ischemic attack) 10/26/2013  . Tobacco user 05/20/2013  . Postoperative anemia due to acute blood loss 01/18/2013  . Osteoarthritis of left knee 01/17/2013  . Essential hypertension 12/07/2012  . Peripheral edema 12/07/2012  . Diabetes mellitus with complication ([place]) [id number]  . COPD mixed type ([place]) 08/05/2012  . Seasonal and perennial allergic rhinitis 08/05/2012  . Urticaria 08/05/2012  . Chest pain 02/06/2012  . Hyperlipidemia 02/06/2012    Quran Vasco, [place] MPT 03/22/2019, 1:04 PM  [facility] [address] [place], [place], [address] Phone: [phone]   Fax:  [phone]  Name: [name] [name] MRN: [id number] Date of Birth: 1950/09/17

## 2019-03-25 ENCOUNTER — Other Ambulatory Visit: Payer: Self-pay

## 2019-03-25 ENCOUNTER — Encounter: Payer: Self-pay | Admitting: Physical Therapy

## 2019-03-25 ENCOUNTER — Ambulatory Visit: Payer: Medicare Other | Admitting: Physical Therapy

## 2019-03-25 DIAGNOSIS — M542 Cervicalgia: Secondary | ICD-10-CM | POA: Diagnosis not present

## 2019-03-25 DIAGNOSIS — R293 Abnormal posture: Secondary | ICD-10-CM

## 2019-03-25 NOTE — Therapy (Signed)
[facility] [place], [place], [address] Phone: [phone]   Fax:  [phone]  Physical Therapy Treatment  Patient Details  Name: [name] [name] MRN: [id number] Date of Birth: 14-Dec-1950 Referring Provider (PT): Melina Schools MD   Encounter Date: 03/25/2019  PT End of Session - 03/25/19 1609    Visit Number  3    Number of Visits  12    Date for PT Re-Evaluation  04/30/19    Authorization Type  FOTO AT LEAST EVERY 5TH VISIT.  KX MODIFIER AFTER 15 VISITS.  PROGRESS NOTE AT 10TH VISIT.    PT Start Time  1430    PT Stop Time  1518    PT Time Calculation (min)  48 min    Activity Tolerance  Patient tolerated treatment well    Behavior During Therapy  [facility] for tasks assessed/performed       Past Medical History:  Diagnosis Date  . Anemia    hx of years ago   . Anxiety   . Aortic valve disorders   . Arthritis   . Asthma   . Barrett esophagus   . Bipolar disorder ([place])   . CHF (congestive heart failure) ([place])   . Chronic bronchitis ([place])   . Chronic kidney disease    "I don't have but 1" (08/03/2016)  . COPD (chronic obstructive pulmonary disease) ([place])    "just have a touch" (08/03/2016)  . Depression   . Esophageal reflux   . Esophageal stricture   . Fibromyalgia    "hands, knees, elbows; back; think it's in my hips" (08/03/2016)  . GAD (generalized anxiety disorder)   . GERD (gastroesophageal reflux disease)   . Hiatal hernia   . History of blood transfusion 1970s; 01/08/2013   "low blood count"  . History of kidney stones 01-08-13   past hx.  . History of nuclear stress test 01/2016   low risk study  . History of stomach ulcers    "some have bleed"  . Hyperlipemia   . Hypertension 12/07/2012  . Insomnia, unspecified   . Myocardial infarction [facility]) 01-08-13   '06-Chest pain-no stent-dx. MI-stress related  . Osteoporosis   . Personal history of colonic polyps 09/2010   TUBULAR ADENOMAS (X3); NEGATIVE FOR  HIGH GRADE DYSPLASIA OR MALIGNANCY.  [name] Pneumonia    "several times" (08/03/2016)  . Stroke [facility])    "2 or 3"; remains with some right sided weaknes (08/03/2016)  . Takotsubo syndrome   . Tremor, essential 03/20/2017  . Type II diabetes mellitus ([place])   . Urinary frequency    AND INCONTINENCE    Past Surgical History:  Procedure Laterality Date  . ANTERIOR CERVICAL DECOMP/DISCECTOMY FUSION N/A 01/31/2019   Procedure: ANTERIOR CERVICAL DECOMPRESSION/DISCECTOMY FUSION C5-6;  Surgeon: Melina Schools, MD;  Location: [place];  Service: Orthopedics;  Laterality: N/A;  3 hrs  . ATRIAL FIBRILLATION ABLATION  08/03/2016  . CARDIAC CATHETERIZATION     normal per Dr Acie Fredrickson in note dated 02/06/12   . CARDIAC CATHETERIZATION     "I've had 2 or 3" (08/03/2016)  . CARPAL TUNNEL RELEASE Right   . CATARACT EXTRACTION W/ INTRAOCULAR LENS  IMPLANT, BILATERAL    . COLONOSCOPY W/ BIOPSIES AND POLYPECTOMY    . DILATION AND CURETTAGE OF UTERUS     S/P miscarriage  . ELECTROPHYSIOLOGIC STUDY N/A 08/03/2016   Procedure: Atrial Fibrillation Ablation;  Surgeon: Will Meredith Leeds, MD;  Location: [place] [facility];  Service: Cardiovascular;  Laterality: N/A;  . ESOPHAGEAL DILATION  01-08-13   2 yrs ago  . HEEL SPUR SURGERY Left 2005  . KNEE ARTHROSCOPY  02/28/2012   Procedure: ARTHROSCOPY KNEE;  Surgeon: Tobi Bastos, MD;  [facility];  Service: Orthopedics;  Laterality: Left;  . LAPAROSCOPIC CHOLECYSTECTOMY  03/2002  . SHOULDER OPEN ROTATOR CUFF REPAIR Right 2011  . TONSILLECTOMY AND ADENOIDECTOMY  1965  . TOTAL KNEE ARTHROPLASTY Left 01/17/2013   Procedure: LEFT TOTAL KNEE ARTHROPLASTY;  Surgeon: Tobi Bastos, MD;  [facility];  Service: Orthopedics;  Laterality: Left;  . TOTAL KNEE ARTHROPLASTY Right 06/19/2014   Procedure: RIGHT TOTAL KNEE ARTHROPLASTY;  Surgeon: Tobi Bastos, MD;  [facility];  Service: Orthopedics;  Laterality: Right;  . TUBAL LIGATION    . VAGINAL HYSTERECTOMY      "partial"  . WRIST FLEXION TENDON TENOTOMIES AND PROXIMAL CORPECTOMY W/ WRIST ARTHRODESIS&ILIAC CREST BONE GRAFT      There were no vitals filed for this visit.  Subjective Assessment - 03/25/19 1509    Subjective  COVID-19 screen performed prior to patient entering clinic.  Patient reports feeling "fair."    Pertinent History  AF, COPD, CVA, TIA, bilateral TKA, right RTC repair, OP, DM and CTS.    Patient Stated Goals  Get back to normal.    Currently in Pain?  Yes   "fair" but no pain number provided        [facility] PT Assessment - 03/25/19 0001      Assessment   Medical Diagnosis  ACDF C5-6.    Referring Provider (PT)  Melina Schools MD      Precautions   Precaution Comments  PLEASE FOLLOW CERVICAL FUSION PROTOCOL.                   [facility] Adult PT Treatment/Exercise - 03/25/19 0001      Modalities   Modalities  Electrical Stimulation;Moist Heat      Moist Heat Therapy   Number Minutes Moist Heat  15 Minutes    Moist Heat Location  Cervical      Electrical Stimulation   Electrical Stimulation Location  Bil UT's    Electrical Stimulation Action  pre-mod    Electrical Stimulation Parameters  80-150 hz x15 mins    Electrical Stimulation Goals  Tone;Pain      Manual Therapy   Manual Therapy  Soft tissue mobilization    Soft tissue mobilization  STW/M to bilateral UT's to decrease tone and pain                  PT Long Term Goals - 03/19/19 1529      PT LONG TERM GOAL #1   Title  Independent with a HEP.    Time  6    Period  Weeks    Status  New      PT LONG TERM GOAL #2   Title  Increase active cervical rotation to 60 degrees+ so patient can turn head more easily while driving.    Time  6    Period  Weeks    Status  New      PT LONG TERM GOAL #3   Title  Perform ADL's with pain not > 2/10.    Time  6    Period  Weeks    Status  New            Plan - 03/25/19 1605    Clinical Impression Statement  Patient responded well to  STW/M to bilateral UT's. Increased tone noted in L UT in comparison to R a palpable decrease in tone noted at end of STW/M. No adverse affects noted upon removal of modalities.    Personal Factors and Comorbidities  Comorbidity 1;Comorbidity 2    Comorbidities  AF, COPD, CVA, TIA, bilateral TKA, right RTC repair, OP, DM and CTS.    Examination-Activity Limitations  Bathing    Stability/Clinical Decision Making  Stable/Uncomplicated    Clinical Decision Making  Low    Rehab Potential  Good    PT Frequency  2x / week    PT Duration  6 weeks    PT Treatment/Interventions  ADLs/Self Care Home Management;Electrical Stimulation;Cryotherapy;Moist Heat;Ultrasound;Therapeutic exercise;Therapeutic activities;Patient/family education;Manual techniques;Passive range of motion    PT Next Visit Plan  HMP/e'stim over bilateral UT's, STW/M, pulsed U/S over UT muscle belly okay.  Progress with cervical fusion protocol.    Consulted and Agree with Plan of Care  Patient       Patient will benefit from skilled therapeutic intervention in order to improve the following deficits and impairments:  Pain, Postural dysfunction, Increased muscle spasms, Decreased activity tolerance, Decreased range of motion  Visit Diagnosis: Cervicalgia  Abnormal posture     Problem List Patient Active Problem List   Diagnosis Date Noted  . Cervical disc herniation 01/31/2019  . Stomatitis 07/03/2018  . TMJ (sprain of temporomandibular joint), initial encounter 07/03/2018  . Tremor, essential 03/20/2017  . Syncope and collapse 03/20/2017  . AF (atrial fibrillation) ([place]) 08/03/2016  . Hypokalemia 02/25/2016  . Anxiety 02/24/2016  . Stroke-like symptoms 02/24/2016  . Weakness 01/26/2016  . Atrial fibrillation ([place]) 01/26/2016  . History of colon polyps 01/28/2015  . Dysphagia 01/28/2015  . History of esophageal stricture 01/28/2015  . Primary osteoarthritis of right knee 06/24/2014  . Acute systolic heart failure  ([place]) 06/23/2014  . Takotsubo syndrome 06/21/2014  . Elevated troponin 06/20/2014  . Atrial fibrillation with RVR ([place]) 06/20/2014  . Congestive dilated cardiomyopathy ([place]) 06/20/2014  . Demand ischemia of myocardium ([place]) 06/20/2014  . 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 Respiratory failure requiring intubation ([place]) 06/19/2014  . Acute pulmonary edema ([place]) 06/19/2014  . Acute respiratory failure with hypoxia ([place]) 06/19/2014  . Hypoxemia   . Arterial hypotension   . Gait instability 10/26/2013  . Stroke ([place]) 10/26/2013  . TIA (transient ischemic attack) 10/26/2013  . Tobacco user 05/20/2013  . Postoperative anemia due to acute blood loss 01/18/2013  . Osteoarthritis of left knee 01/17/2013  . Essential hypertension 12/07/2012  . Peripheral edema 12/07/2012  . Diabetes mellitus with complication ([place]) [id number]  . COPD mixed type ([place]) 08/05/2012  . Seasonal and perennial allergic rhinitis 08/05/2012  . Urticaria 08/05/2012  . Chest pain 02/06/2012  . Hyperlipidemia 02/06/2012   Gabriela Eves, PT, DPT 03/25/2019, 4:10 PM  [facility] [address] [place], [place], [address] Phone: [phone]   Fax:  [phone]  Name: [name] [name] MRN: [id number] Date of Birth: [birth year]

## 2019-03-29 ENCOUNTER — Ambulatory Visit: Payer: Medicare Other | Admitting: Physical Therapy

## 2019-03-29 ENCOUNTER — Other Ambulatory Visit: Payer: Self-pay

## 2019-03-29 DIAGNOSIS — R293 Abnormal posture: Secondary | ICD-10-CM

## 2019-03-29 DIAGNOSIS — M542 Cervicalgia: Secondary | ICD-10-CM

## 2019-03-29 NOTE — Therapy (Signed)
[facility] [place], [place], [address] Phone: [phone]   Fax:  [phone]  Physical Therapy Treatment  Patient Details  Name: [name] [name] MRN: [id number] Date of Birth: 1950/10/27 Referring Provider (PT): Melina Schools MD   Encounter Date: 03/29/2019  PT End of Session - 03/29/19 [id number]    Visit Number  4    Number of Visits  12    Date for PT Re-Evaluation  04/30/19    Authorization Type  FOTO AT LEAST EVERY 5TH VISIT.  KX MODIFIER AFTER 15 VISITS.  PROGRESS NOTE AT 10TH VISIT.    PT Start Time  1030    PT Stop Time  1121    PT Time Calculation (min)  51 min    Activity Tolerance  Patient tolerated treatment well    Behavior During Therapy  WFL for tasks assessed/performed       Past Medical History:  Diagnosis Date  . Anemia    hx of years ago   . Anxiety   . Aortic valve disorders   . Arthritis   . Asthma   . Barrett esophagus   . Bipolar disorder ([place])   . CHF (congestive heart failure) ([place])   . Chronic bronchitis ([place])   . Chronic kidney disease    "I don't have but 1" (08/03/2016)  . COPD (chronic obstructive pulmonary disease) ([place])    "just have a touch" (08/03/2016)  . Depression   . Esophageal reflux   . Esophageal stricture   . Fibromyalgia    "hands, knees, elbows; back; think it's in my hips" (08/03/2016)  . GAD (generalized anxiety disorder)   . GERD (gastroesophageal reflux disease)   . Hiatal hernia   . History of blood transfusion 1970s; 01/08/2013   "low blood count"  . History of kidney stones 01-08-13   past hx.  . History of nuclear stress test 01/2016   low risk study  . History of stomach ulcers    "some have bleed"  . Hyperlipemia   . Hypertension 12/07/2012  . Insomnia, unspecified   . Myocardial infarction [facility]) 01-08-13   '06-Chest pain-no stent-dx. MI-stress related  . Osteoporosis   . Personal history of colonic polyps 09/2010   TUBULAR ADENOMAS (X3); NEGATIVE FOR  HIGH GRADE DYSPLASIA OR MALIGNANCY.  [name] Pneumonia    "several times" (08/03/2016)  . Stroke [facility])    "2 or 3"; remains with some right sided weaknes (08/03/2016)  . Takotsubo syndrome   . Tremor, essential 03/20/2017  . Type II diabetes mellitus ([place])   . Urinary frequency    AND INCONTINENCE    Past Surgical History:  Procedure Laterality Date  . ANTERIOR CERVICAL DECOMP/DISCECTOMY FUSION N/A 01/31/2019   Procedure: ANTERIOR CERVICAL DECOMPRESSION/DISCECTOMY FUSION C5-6;  Surgeon: Melina Schools, MD;  Location: [facility];  Service: Orthopedics;  Laterality: N/A;  3 hrs  . ATRIAL FIBRILLATION ABLATION  08/03/2016  . CARDIAC CATHETERIZATION     normal per Dr Acie Fredrickson in note dated 02/06/12   . CARDIAC CATHETERIZATION     "I've had 2 or 3" (08/03/2016)  . CARPAL TUNNEL RELEASE Right   . CATARACT EXTRACTION W/ INTRAOCULAR LENS  IMPLANT, BILATERAL    . COLONOSCOPY W/ BIOPSIES AND POLYPECTOMY    . DILATION AND CURETTAGE OF UTERUS     S/P miscarriage  . ELECTROPHYSIOLOGIC STUDY N/A 08/03/2016   Procedure: Atrial Fibrillation Ablation;  Surgeon: Will Meredith Leeds, MD;  Location: [facility];  Service: Cardiovascular;  Laterality: N/A;  . ESOPHAGEAL DILATION  01-08-13   2 yrs ago  . HEEL SPUR SURGERY Left 2005  . KNEE ARTHROSCOPY  02/28/2012   Procedure: ARTHROSCOPY KNEE;  Surgeon: Tobi Bastos, MD;  [facility];  Service: Orthopedics;  Laterality: Left;  . LAPAROSCOPIC CHOLECYSTECTOMY  03/2002  . SHOULDER OPEN ROTATOR CUFF REPAIR Right 2011  . TONSILLECTOMY AND ADENOIDECTOMY  1965  . TOTAL KNEE ARTHROPLASTY Left 01/17/2013   Procedure: LEFT TOTAL KNEE ARTHROPLASTY;  Surgeon: Tobi Bastos, MD;  [facility];  Service: Orthopedics;  Laterality: Left;  . TOTAL KNEE ARTHROPLASTY Right 06/19/2014   Procedure: RIGHT TOTAL KNEE ARTHROPLASTY;  Surgeon: Tobi Bastos, MD;  [facility];  Service: Orthopedics;  Laterality: Right;  . TUBAL LIGATION    . VAGINAL HYSTERECTOMY      "partial"  . WRIST FLEXION TENDON TENOTOMIES AND PROXIMAL CORPECTOMY W/ WRIST ARTHRODESIS&ILIAC CREST BONE GRAFT      There were no vitals filed for this visit.  Subjective Assessment - 03/29/19 1222    Subjective  COVID-19 screen performed prior to patient entering clinic.  I'm doing my home exercises and I'm doing so much better.    Pertinent History  AF, COPD, CVA, TIA, bilateral TKA, right RTC repair, OP, DM and CTS.    Patient Stated Goals  Get back to normal.    Currently in Pain?  Yes    Pain Score  1     Pain Location  Neck    Pain Orientation  Right;Left    Pain Descriptors / Indicators  Aching;Dull    Pain Type  Surgical pain    Pain Onset  More than a month ago                       [facility] Adult PT Treatment/Exercise - 03/29/19 0001      Modalities   Modalities  Electrical Stimulation;Moist Heat      Moist Heat Therapy   Number Minutes Moist Heat  20 Minutes    Moist Heat Location  Cervical      Electrical Stimulation   Electrical Stimulation Location  Bil UT's    Electrical Stimulation Action  Pre-mod.    Electrical Stimulation Parameters  80-150 Hz x 20 minutes.    Electrical Stimulation Goals  Tone;Wound      Manual Therapy   Manual Therapy  Soft tissue mobilization    Soft tissue mobilization  STW/M to bilateral UT's to reduce tone x 24 minutes.                  PT Long Term Goals - 03/29/19 1226      PT LONG TERM GOAL #1   Title  Independent with a HEP.    Time  6    Period  Weeks    Status  On-going      PT LONG TERM GOAL #2   Title  Increase active cervical rotation to 60 degrees+ so patient can turn head more easily while driving.    Baseline  LT= 70 degrees and RT= 65 degrees.    Time  6    Period  Weeks    Status  Achieved      PT LONG TERM GOAL #3   Title  Perform ADL's with pain not > 2/10.    Time  6    Period  Weeks    Status  On-going  Plan - 03/29/19 1228    Clinical Impression  Statement  Patient is doing extremely well with PT.  Her cervical range of motion has improved dramatically and her FOTO score has improved significantly to a 32% limitation.    Personal Factors and Comorbidities  Comorbidity 1;Comorbidity 2    Comorbidities  AF, COPD, CVA, TIA, bilateral TKA, right RTC repair, OP, DM and CTS.    Examination-Activity Limitations  Bathing    Rehab Potential  Good    PT Frequency  2x / week    PT Duration  6 weeks    PT Treatment/Interventions  ADLs/Self Care Home Management;Electrical Stimulation;Cryotherapy;Moist Heat;Ultrasound;Therapeutic exercise;Therapeutic activities;Patient/family education;Manual techniques;Passive range of motion    PT Next Visit Plan  HMP/e'stim over bilateral UT's, STW/M, pulsed U/S over UT muscle belly okay.  Progress with cervical fusion protocol.    Consulted and Agree with Plan of Care  Patient       Patient will benefit from skilled therapeutic intervention in order to improve the following deficits and impairments:  Pain, Postural dysfunction, Increased muscle spasms, Decreased activity tolerance, Decreased range of motion  Visit Diagnosis: Cervicalgia  Abnormal posture     Problem List Patient Active Problem List   Diagnosis Date Noted  . Cervical disc herniation 01/31/2019  . Stomatitis 07/03/2018  . TMJ (sprain of temporomandibular joint), initial encounter 07/03/2018  . Tremor, essential 03/20/2017  . Syncope and collapse 03/20/2017  . AF (atrial fibrillation) ([place]) 08/03/2016  . Hypokalemia 02/25/2016  . Anxiety 02/24/2016  . Stroke-like symptoms 02/24/2016  . Weakness 01/26/2016  . Atrial fibrillation ([place]) 01/26/2016  . History of colon polyps 01/28/2015  . Dysphagia 01/28/2015  . History of esophageal stricture 01/28/2015  . Primary osteoarthritis of right knee 06/24/2014  . Acute systolic heart failure ([place]) 06/23/2014  . Takotsubo syndrome 06/21/2014  . Elevated troponin 06/20/2014  . Atrial  fibrillation with RVR ([place]) 06/20/2014  . Congestive dilated cardiomyopathy ([place]) 06/20/2014  . Demand ischemia of myocardium ([place]) 06/20/2014  . 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 Respiratory failure requiring intubation ([place]) 06/19/2014  . Acute pulmonary edema ([place]) 06/19/2014  . Acute respiratory failure with hypoxia ([place]) 06/19/2014  . Hypoxemia   . Arterial hypotension   . Gait instability 10/26/2013  . Stroke ([place]) 10/26/2013  . TIA (transient ischemic attack) 10/26/2013  . Tobacco user 05/20/2013  . Postoperative anemia due to acute blood loss 01/18/2013  . Osteoarthritis of left knee 01/17/2013  . Essential hypertension 12/07/2012  . Peripheral edema 12/07/2012  . Diabetes mellitus with complication ([place]) [id number]  . COPD mixed type ([place]) 08/05/2012  . Seasonal and perennial allergic rhinitis 08/05/2012  . Urticaria 08/05/2012  . Chest pain 02/06/2012  . Hyperlipidemia 02/06/2012    Damascus Feldpausch, [place] MPT 03/29/2019, 1:05 PM  [facility] [address] [place], [place], [address] Phone: [phone]   Fax:  [phone]  Name: [name] [name] MRN: [id number] Date of Birth: 24-Jun-1951

## 2019-04-02 ENCOUNTER — Ambulatory Visit: Payer: Medicare Other | Admitting: Physical Therapy

## 2019-04-04 ENCOUNTER — Ambulatory Visit: Payer: Medicare Other | Attending: Orthopedic Surgery | Admitting: Physical Therapy

## 2019-04-04 ENCOUNTER — Other Ambulatory Visit: Payer: Self-pay | Admitting: Cardiology

## 2019-04-04 ENCOUNTER — Other Ambulatory Visit: Payer: Self-pay

## 2019-04-04 DIAGNOSIS — M542 Cervicalgia: Secondary | ICD-10-CM | POA: Diagnosis not present

## 2019-04-04 DIAGNOSIS — R293 Abnormal posture: Secondary | ICD-10-CM

## 2019-04-04 NOTE — Therapy (Addendum)
[facility] [place], [place], [address] Phone: [phone]   Fax:  [phone]  Physical Therapy Treatment  Patient Details  Name: [name] [name] MRN: [id number] Date of Birth: 02-18-1951 Referring Provider (PT): Melina Schools MD   Encounter Date: 04/04/2019  PT End of Session - 04/04/19 1156    Visit Number  5    Number of Visits  12    Date for PT Re-Evaluation  04/30/19    Authorization Type  FOTO AT LEAST EVERY 5TH VISIT.  KX MODIFIER AFTER 15 VISITS.  PROGRESS NOTE AT 10TH VISIT.    PT Start Time  1116    PT Stop Time  1200    PT Time Calculation (min)  44 min    Activity Tolerance  Patient tolerated treatment well    Behavior During Therapy  WFL for tasks assessed/performed       Past Medical History:  Diagnosis Date  . Anemia    hx of years ago   . Anxiety   . Aortic valve disorders   . Arthritis   . Asthma   . Barrett esophagus   . Bipolar disorder ([place])   . CHF (congestive heart failure) ([place])   . Chronic bronchitis ([place])   . Chronic kidney disease    "I don't have but 1" (08/03/2016)  . COPD (chronic obstructive pulmonary disease) ([place])    "just have a touch" (08/03/2016)  . Depression   . Esophageal reflux   . Esophageal stricture   . Fibromyalgia    "hands, knees, elbows; back; think it's in my hips" (08/03/2016)  . GAD (generalized anxiety disorder)   . GERD (gastroesophageal reflux disease)   . Hiatal hernia   . History of blood transfusion 1970s; 01/08/2013   "low blood count"  . History of kidney stones 01-08-13   past hx.  . History of nuclear stress test 01/2016   low risk study  . History of stomach ulcers    "some have bleed"  . Hyperlipemia   . Hypertension 12/07/2012  . Insomnia, unspecified   . Myocardial infarction [facility] 01-08-13   '06-Chest pain-no stent-dx. MI-stress related  . Osteoporosis   . Personal history of colonic polyps 09/2010   TUBULAR ADENOMAS (X3); NEGATIVE FOR HIGH  GRADE DYSPLASIA OR MALIGNANCY.  [name] Pneumonia    "several times" (08/03/2016)  . Stroke [facility])    "2 or 3"; remains with some right sided weaknes (08/03/2016)  . Takotsubo syndrome   . Tremor, essential 03/20/2017  . Type II diabetes mellitus ([place])   . Urinary frequency    AND INCONTINENCE    Past Surgical History:  Procedure Laterality Date  . ANTERIOR CERVICAL DECOMP/DISCECTOMY FUSION N/A 01/31/2019   Procedure: ANTERIOR CERVICAL DECOMPRESSION/DISCECTOMY FUSION C5-6;  Surgeon: Melina Schools, MD;  Location: [facility];  Service: Orthopedics;  Laterality: N/A;  3 hrs  . ATRIAL FIBRILLATION ABLATION  08/03/2016  . CARDIAC CATHETERIZATION     normal per Dr Acie Fredrickson in note dated 02/06/12   . CARDIAC CATHETERIZATION     "I've had 2 or 3" (08/03/2016)  . CARPAL TUNNEL RELEASE Right   . CATARACT EXTRACTION W/ INTRAOCULAR LENS  IMPLANT, BILATERAL    . COLONOSCOPY W/ BIOPSIES AND POLYPECTOMY    . DILATION AND CURETTAGE OF UTERUS     S/P miscarriage  . ELECTROPHYSIOLOGIC STUDY N/A 08/03/2016   Procedure: Atrial Fibrillation Ablation;  Surgeon: Will Meredith Leeds, MD;  Location: [facility];  Service: Cardiovascular;  Laterality: N/A;  . ESOPHAGEAL DILATION  01-08-13   2 yrs ago  . HEEL SPUR SURGERY Left 2005  . KNEE ARTHROSCOPY  02/28/2012   Procedure: ARTHROSCOPY KNEE;  Surgeon: Tobi Bastos, MD;  [facility];  Service: Orthopedics;  Laterality: Left;  . LAPAROSCOPIC CHOLECYSTECTOMY  03/2002  . SHOULDER OPEN ROTATOR CUFF REPAIR Right 2011  . TONSILLECTOMY AND ADENOIDECTOMY  1965  . TOTAL KNEE ARTHROPLASTY Left 01/17/2013   Procedure: LEFT TOTAL KNEE ARTHROPLASTY;  Surgeon: Tobi Bastos, MD;  [facility];  Service: Orthopedics;  Laterality: Left;  . TOTAL KNEE ARTHROPLASTY Right 06/19/2014   Procedure: RIGHT TOTAL KNEE ARTHROPLASTY;  Surgeon: Tobi Bastos, MD;  [facility];  Service: Orthopedics;  Laterality: Right;  . TUBAL LIGATION    . VAGINAL HYSTERECTOMY     "partial"   . WRIST FLEXION TENDON TENOTOMIES AND PROXIMAL CORPECTOMY W/ WRIST ARTHRODESIS&ILIAC CREST BONE GRAFT      There were no vitals filed for this visit.  Subjective Assessment - 04/04/19 1118    Subjective  COVID-19 screen performed prior to patient entering clinic.  Patient arrived with little discomfort    Pertinent History  AF, COPD, CVA, TIA, bilateral TKA, right RTC repair, OP, DM and CTS.    Patient Stated Goals  Get back to normal.    Currently in Pain?  Yes    Pain Score  1     Pain Location  Neck    Pain Orientation  Left    Pain Descriptors / Indicators  Discomfort    Pain Type  Surgical pain    Pain Onset  More than a month ago    Pain Frequency  Constant    Aggravating Factors   certain rotation    Pain Relieving Factors  at rest                       [facility] Adult PT Treatment/Exercise - 04/04/19 0001      Moist Heat Therapy   Number Minutes Moist Heat  15 Minutes    Moist Heat Location  Cervical      Electrical Stimulation   Electrical Stimulation Location  Bil UT's    Electrical Stimulation Action  premod    Electrical Stimulation Parameters  80-150hz x54mn    Electrical Stimulation Goals  Tone;Pain      Manual Therapy   Manual Therapy  Soft tissue mobilization    Soft tissue mobilization  STW/M to bilateral UT's to reduce tone              PT Education - 04/04/19 1206    Education Details  HEP    Person(s) Educated  Patient    Methods  Explanation;Demonstration;Handout    Comprehension  Verbalized understanding;Returned demonstration          PT Long Term Goals - 04/04/19 1125      PT LONG TERM GOAL #1   Title  Independent with a HEP.    Time  6    Period  Weeks    Status  Achieved   HEP given and patient has good understanding. 04/04/19     PT LONG TERM GOAL #2   Title  Increase active cervical rotation to 60 degrees+ so patient can turn head more easily while driving.    Baseline  LT= 70 degrees and RT= 65 degrees.     Time  6    Period  Weeks    Status  Achieved      PT LONG TERM GOAL #3   Title  Perform ADL's with pain not > 2/10.    Time  6    Period  Weeks    Status  Achieved   pain can get do ADL's with no pain 1/10           Plan - 04/04/19 1212    Clinical Impression Statement  Patient tolerated treatment well today. Patient has slight soreness in neck overall. Patient is able to perform ADL's independent and has full AROM for rotation today. Patient given HEP and independent. All current goals met today. Patient would like to be put on hold.    Personal Factors and Comorbidities  Comorbidity 1;Comorbidity 2    Comorbidities  AF, COPD, CVA, TIA, bilateral TKA, right RTC repair, OP, DM and CTS.    Examination-Activity Limitations  Bathing    Rehab Potential  Good    PT Frequency  2x / week    PT Duration  6 weeks    PT Treatment/Interventions  ADLs/Self Care Home Management;Electrical Stimulation;Cryotherapy;Moist Heat;Ultrasound;Therapeutic exercise;Therapeutic activities;Patient/family education;Manual techniques;Passive range of motion    PT Next Visit Plan  on hold per patient request. Will cont with updated goals if patient returns.    Consulted and Agree with Plan of Care  Patient       Patient will benefit from skilled therapeutic intervention in order to improve the following deficits and impairments:  Pain, Postural dysfunction, Increased muscle spasms, Decreased activity tolerance, Decreased range of motion  Visit Diagnosis: Cervicalgia  Abnormal posture     Problem List Patient Active Problem List   Diagnosis Date Noted  . Cervical disc herniation 01/31/2019  . Stomatitis 07/03/2018  . TMJ (sprain of temporomandibular joint), initial encounter 07/03/2018  . Tremor, essential 03/20/2017  . Syncope and collapse 03/20/2017  . AF (atrial fibrillation) ([place]) 08/03/2016  . Hypokalemia 02/25/2016  . Anxiety 02/24/2016  . Stroke-like symptoms 02/24/2016  . Weakness  01/26/2016  . Atrial fibrillation ([place]) 01/26/2016  . History of colon polyps 01/28/2015  . Dysphagia 01/28/2015  . History of esophageal stricture 01/28/2015  . Primary osteoarthritis of right knee 06/24/2014  . Acute systolic heart failure ([place]) 06/23/2014  . Takotsubo syndrome 06/21/2014  . Elevated troponin 06/20/2014  . Atrial fibrillation with RVR ([place]) 06/20/2014  . Congestive dilated cardiomyopathy ([place]) 06/20/2014  . Demand ischemia of myocardium ([place]) 06/20/2014  . 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 Respiratory failure requiring intubation ([place]) 06/19/2014  . Acute pulmonary edema ([place]) 06/19/2014  . Acute respiratory failure with hypoxia ([place]) 06/19/2014  . Hypoxemia   . Arterial hypotension   . Gait instability 10/26/2013  . Stroke ([place]) 10/26/2013  . TIA (transient ischemic attack) 10/26/2013  . Tobacco user 05/20/2013  . Postoperative anemia due to acute blood loss 01/18/2013  . Osteoarthritis of left knee 01/17/2013  . Essential hypertension 12/07/2012  . Peripheral edema 12/07/2012  . Diabetes mellitus with complication ([place]) 66/12/3014  . COPD mixed type ([place]) 08/05/2012  . Seasonal and perennial allergic rhinitis 08/05/2012  . Urticaria 08/05/2012  . Chest pain 02/06/2012  . Hyperlipidemia 02/06/2012    Phillips Climes, PTA 04/04/2019, 12:15 PM  [facility] [place], [place], [address] Phone: [phone]   Fax:  [phone]  Name: [name] [name] MRN: [id number] Date of Birth: 29-Aug-1950  PHYSICAL THERAPY DISCHARGE SUMMARY  Visits from Start of Care: 5.  Current functional level related  to goals / functional outcomes: See above.   Remaining deficits: All goals met.   Education / Equipment: HEP. Plan: Patient agrees to discharge.  Patient goals were met. Patient is being discharged due to meeting the stated rehab goals.  ?????         Mali Applegate  MPT

## 2019-04-04 NOTE — Patient Instructions (Signed)
AROM: Neck Rotation   Turn head slowly to look over one shoulder, then the other. Hold each position _10___ seconds. Repeat _5___ times per set. Do __1__ sets per session. Do _1___ sessions per day.     Stretch Break - Chest and Shoulder Stretch   Maintaining erect posture, draw shoulders back while bringing elbows back and inward. Return to starting position. Repeat __10-20__ times every _1 __ hours.

## 2019-04-08 ENCOUNTER — Other Ambulatory Visit: Payer: Self-pay | Admitting: Cardiology

## 2019-04-08 DIAGNOSIS — I5021 Acute systolic (congestive) heart failure: Secondary | ICD-10-CM

## 2019-04-28 ENCOUNTER — Other Ambulatory Visit: Payer: Self-pay | Admitting: Cardiology

## 2019-05-19 ENCOUNTER — Other Ambulatory Visit: Payer: Self-pay | Admitting: Cardiology

## 2019-06-05 ENCOUNTER — Ambulatory Visit (INDEPENDENT_AMBULATORY_CARE_PROVIDER_SITE_OTHER): Payer: Medicare Other

## 2019-06-05 ENCOUNTER — Other Ambulatory Visit: Payer: Self-pay

## 2019-06-05 DIAGNOSIS — Z23 Encounter for immunization: Secondary | ICD-10-CM

## 2019-06-06 ENCOUNTER — Other Ambulatory Visit: Payer: Self-pay | Admitting: Cardiology

## 2019-06-06 NOTE — Telephone Encounter (Signed)
Pt overdue for 6 month f/u.  Please contact pt for future appointment. 

## 2019-06-19 ENCOUNTER — Ambulatory Visit (INDEPENDENT_AMBULATORY_CARE_PROVIDER_SITE_OTHER): Payer: Medicare Other

## 2019-06-19 ENCOUNTER — Other Ambulatory Visit: Payer: Self-pay

## 2019-06-19 DIAGNOSIS — Z23 Encounter for immunization: Secondary | ICD-10-CM | POA: Diagnosis not present

## 2019-06-19 NOTE — Progress Notes (Signed)
Patient came for prevnar 13 vaccine.  Patient was advised by Dr. Annamaria Boots to receive vaccine at previous OV.  Patient stated she never received vaccine. Patient stated she was told when she received flu vaccine to get Prevnar 13. Patient denies having any reaction to past flu, pneumonia vaccines. Patient denies any cold like symptoms.   Per past OV from Dr. Annamaria Boots-  Instructions    Return in about 6 months (around 09/09/2017). Prevnar 13 pneumonia vaccine      Nothing further at this time.

## 2019-07-06 ENCOUNTER — Other Ambulatory Visit: Payer: Self-pay | Admitting: Cardiology

## 2019-07-07 ENCOUNTER — Other Ambulatory Visit: Payer: Self-pay | Admitting: Cardiology

## 2019-07-07 DIAGNOSIS — I5021 Acute systolic (congestive) heart failure: Secondary | ICD-10-CM

## 2019-08-14 ENCOUNTER — Other Ambulatory Visit: Payer: Self-pay | Admitting: Cardiology

## 2019-08-14 NOTE — Telephone Encounter (Signed)
Eliquis 5mg  refill request received, pt is 69 yrs old, weight-73.2kg, Crea-0.61 on 01/28/2019, Diagnosis-Afib, and last seen by Dr. Curt Bears on 05/09/2018 and pending an appt on 08/16/2019 with Dr. Curt Bears. Dose is appropriate based on dosing criteria. Will send in refill to requested pharmacy.

## 2019-08-16 ENCOUNTER — Encounter: Payer: Self-pay | Admitting: Cardiology

## 2019-08-16 ENCOUNTER — Other Ambulatory Visit: Payer: Self-pay

## 2019-08-16 ENCOUNTER — Ambulatory Visit (INDEPENDENT_AMBULATORY_CARE_PROVIDER_SITE_OTHER): Payer: Medicare Other | Admitting: Cardiology

## 2019-08-16 VITALS — BP 108/64 | HR 64 | Ht 61.0 in | Wt 160.0 lb

## 2019-08-16 DIAGNOSIS — I480 Paroxysmal atrial fibrillation: Secondary | ICD-10-CM | POA: Diagnosis not present

## 2019-08-16 NOTE — Patient Instructions (Signed)
Medication Instructions:  Your physician recommends that you continue on your current medications as directed. Please refer to the Current Medication list given to you today.  * If you need a refill on your cardiac medications before your next appointment, please call your pharmacy.   Labwork: None ordered If you have labs (blood work) drawn today and your tests are completely normal, you will receive your results only by:  [place] (if you have MyChart) OR  A paper copy in the mail If you have any lab test that is abnormal or we need to change your treatment, we will call you to review the results.  Testing/Procedures: None ordered  Follow-Up: At [facility], you and your health needs are our priority.  As part of our continuing mission to provide you with exceptional heart care, we have created designated Provider Care Teams.  These Care Teams include your primary Cardiologist (physician) and Advanced Practice Providers (APPs -  Physician Assistants and Nurse Practitioners) who all work together to provide you with the care you need, when you need it.  You will need a follow up appointment in 1 year.  Please call our office 2 months in advance to schedule this appointment.  You may see Dr Curt Bears or one of the following Advanced Practice Providers on your designated Care Team:    Chanetta Marshall, NP  Tommye Standard, PA-C  Oda Kilts, [place]   Thank you for choosing [facility]!!   Trinidad Curet, RN [phone]

## 2019-08-16 NOTE — Progress Notes (Signed)
Electrophysiology Office Note   Date:  08/16/2019   ID:  Karah, [name] [name], [name] [name], MRN [id number]  PCP:  Velna Hatchet, MD  Cardiologist:  [place] Primary Electrophysiologist:  Shanaya Schneck Meredith Leeds, MD    No chief complaint on file.    History of Present Illness: [name] [name] is a 69 y.o. female who presents today for electrophysiology evaluation.   Hx paroxysmal atrial fibrillaiton, Takotsubo, HLD. Her EF has normalized from a low of 15-20%. She was admitted to the hospital in July with chest pain, and was found to have atrial fibrillation with RVR. She had a stress test which was a low risk study. An echo at the time showed that her EF was normal. Since being seen, she has been maintained on flecainide. She has been compliant with her anticoagulation. AF ablation 08/03/16.  Today, denies symptoms of palpitations, chest pain, shortness of breath, orthopnea, PND, lower extremity edema, claudication, dizziness, presyncope, syncope, bleeding, or neurologic sequela. The patient is tolerating medications without difficulties.  She has been feeling weak and fatigued over the past few months.  She does say that over the summer her husband left her.  Her mother also has sold her house and is trying to move.  She feels that her weakness and fatigue is potentially due to stress.   Past Medical History:  Diagnosis Date  . Anemia    hx of years ago   . Anxiety   . Aortic valve disorders   . Arthritis   . Asthma   . Barrett esophagus   . Bipolar disorder ([place])   . CHF (congestive heart failure) ([place])   . Chronic bronchitis ([place])   . Chronic kidney disease    "I don't have but 1" (08/03/2016)  . COPD (chronic obstructive pulmonary disease) ([place])    "just have a touch" (08/03/2016)  . Depression   . Esophageal reflux   . Esophageal stricture   . Fibromyalgia    "hands, knees, elbows; back; think it's in my hips" (08/03/2016)  . GAD (generalized anxiety disorder)   . GERD  (gastroesophageal reflux disease)   . Hiatal hernia   . History of blood transfusion 1970s; 01/08/2013   "low blood count"  . History of kidney stones 01-08-13   past hx.  . History of nuclear stress test 01/2016   low risk study  . History of stomach ulcers    "some have bleed"  . Hyperlipemia   . Hypertension 12/07/2012  . Insomnia, unspecified   . Myocardial infarction [facility]) 01-08-13   '06-Chest pain-no stent-dx. MI-stress related  . Osteoporosis   . Personal history of colonic polyps 09/2010   TUBULAR ADENOMAS (X3); NEGATIVE FOR HIGH GRADE DYSPLASIA OR MALIGNANCY.  [name] Pneumonia    "several times" (08/03/2016)  . Stroke [facility])    "2 or 3"; remains with some right sided weaknes (08/03/2016)  . Takotsubo syndrome   . Tremor, essential 03/20/2017  . Type II diabetes mellitus ([place])   . Urinary frequency    AND INCONTINENCE   Past Surgical History:  Procedure Laterality Date  . ANTERIOR CERVICAL DECOMP/DISCECTOMY FUSION N/A 01/31/2019   Procedure: ANTERIOR CERVICAL DECOMPRESSION/DISCECTOMY FUSION C5-6;  Surgeon: Melina Schools, MD;  Location: [facility];  Service: Orthopedics;  Laterality: N/A;  3 hrs  . ATRIAL FIBRILLATION ABLATION  08/03/2016  . CARDIAC CATHETERIZATION     normal per Dr Acie Fredrickson in note dated 02/06/12   . CARDIAC CATHETERIZATION     "I've had 2 or 3" (  08/03/2016)  . CARPAL TUNNEL RELEASE Right   . CATARACT EXTRACTION W/ INTRAOCULAR LENS  IMPLANT, BILATERAL    . COLONOSCOPY W/ BIOPSIES AND POLYPECTOMY    . DILATION AND CURETTAGE OF UTERUS     S/P miscarriage  . ELECTROPHYSIOLOGIC STUDY N/A 08/03/2016   Procedure: Atrial Fibrillation Ablation;  Surgeon: Jakera Beaupre Meredith Leeds, MD;  Location: [facility];  Service: Cardiovascular;  Laterality: N/A;  . ESOPHAGEAL DILATION  01-08-13   2 yrs ago  . HEEL SPUR SURGERY Left 2005  . KNEE ARTHROSCOPY  02/28/2012   Procedure: ARTHROSCOPY KNEE;  Surgeon: Tobi Bastos, MD;  Location: [facility];  Service: Orthopedics;  Laterality: Left;  .  LAPAROSCOPIC CHOLECYSTECTOMY  03/2002  . SHOULDER OPEN ROTATOR CUFF REPAIR Right 2011  . TONSILLECTOMY AND ADENOIDECTOMY  1965  . TOTAL KNEE ARTHROPLASTY Left 01/17/2013   Procedure: LEFT TOTAL KNEE ARTHROPLASTY;  Surgeon: Tobi Bastos, MD;  Location: [facility];  Service: Orthopedics;  Laterality: Left;  . TOTAL KNEE ARTHROPLASTY Right 06/19/2014   Procedure: RIGHT TOTAL KNEE ARTHROPLASTY;  Surgeon: Tobi Bastos, MD;  Location: [facility];  Service: Orthopedics;  Laterality: Right;  . TUBAL LIGATION    . VAGINAL HYSTERECTOMY     "partial"  . WRIST FLEXION TENDON TENOTOMIES AND PROXIMAL CORPECTOMY W/ WRIST ARTHRODESIS&ILIAC CREST BONE GRAFT       Current Outpatient Medications  Medication Sig Dispense Refill  . albuterol (PROVENTIL) (2.5 MG/3ML) 0.083% nebulizer solution Take 3 mLs (2.5 mg total) by nebulization every 6 (six) hours as needed for wheezing. DX  491.9, J44.9 360 mL 12  . ALPRAZolam (XANAX) 1 MG tablet Take 1 mg by mouth as needed.    [name] atorvastatin (LIPITOR) 80 MG tablet Take 80 mg by mouth daily.    [name] azelastine (ASTELIN) 0.1 % nasal spray Place 1 spray into both nostrils at bedtime.     . beta carotene 25000 UNIT capsule Take 25,000 Units by mouth daily.    . cholecalciferol (VITAMIN D) 1000 UNITS tablet Take 1,000 Units by mouth daily.     [name] dexlansoprazole (DEXILANT) 60 MG capsule Take 60 mg by mouth daily.    [name] docusate sodium (COLACE) 100 MG capsule Take 100 mg by mouth 2 (two) times daily.    [name] ELIQUIS 5 MG TABS tablet TAKE 1 TABLET BY MOUTH TWICE DAILY 60 tablet 5  . EPINEPHrine (EPIPEN 2-PAK) 0.3 mg/0.3 mL IJ SOAJ injection USE AS DIRECTED 2 each 12  . Flaxseed, Linseed, (FLAXSEED OIL) 1200 MG CAPS Take 1,200 mg by mouth at bedtime.    . furosemide (LASIX) 20 MG tablet Take 1 tablet (20 mg total) by mouth daily. Please keep upcoming appt in Jan, 2021 for further refills 60 tablet 0  . gabapentin (NEURONTIN) 300 MG capsule Take 300-600 mg by mouth See admin  instructions. Take 300 mg by mouth in the morning and afternoon and 600 mg at bedtime    . glipiZIDE (GLUCOTROL) 5 MG tablet Take 5 mg by mouth 2 (two) times daily before a meal.    . isosorbide mononitrate (IMDUR) 30 MG 24 hr tablet TAKE 1 TABLET BY MOUTH EVERY DAY 30 tablet 1  . losartan (COZAAR) 25 MG tablet Take 25 mg by mouth daily.    . montelukast (SINGULAIR) 10 MG tablet Take 1 tablet (10 mg total) by mouth at bedtime. 30 tablet 11  . ondansetron (ZOFRAN) 4 MG tablet Take 1 tablet (4 mg total) by mouth every 8 (eight)  hours as needed for nausea or vomiting. 20 tablet 0  . PARoxetine (PAXIL) 30 MG tablet Take 30 mg by mouth at bedtime.   1  . PROAIR HFA 108 (90 Base) MCG/ACT inhaler INHALE TWO PUFFS INTO THE LUNGS EVERY 6 HOURS AS NEEDED FOR WHEEZING OR SHORTNESS OF BREATH (Patient taking differently: Inhale 2 puffs into the lungs every 6 (six) hours as needed for wheezing or shortness of breath. ) 8.5 g 11  . SPIRIVA HANDIHALER 18 MCG inhalation capsule PLACE ONE CAPSULE IN INHALER AND INHALE DAILY (Patient taking differently: Place 18 mcg into inhaler and inhale daily. ) 30 capsule 6  . vitamin B-12 (CYANOCOBALAMIN) 1000 MCG tablet Take 1,000 mcg by mouth daily.      No current facility-administered medications for this visit.   Facility-Administered Medications Ordered in Other Visits  Medication Dose Route Frequency Provider Last Rate Last Admin  . tranexamic acid (CYKLOKAPRON) 2,000 mg in sodium chloride 0.9 % 50 mL Topical Application  123XX123 mg Topical Once Constable, [employer], PA-C        Allergies:   Bee venom, Ceclor [cefaclor], Penicillins, Pneumococcal vaccines, Codeine, Cortisone, and Boniva [ibandronate sodium]   Social History:  The patient  reports that she has been smoking cigarettes. She has a 25.00 pack-year smoking history. She quit smokeless tobacco use about 7 years ago.  Her smokeless tobacco use included snuff and chew. She reports current alcohol use. She reports that  she does not use drugs.   Family History:  The patient's family history includes Breast cancer in her maternal aunt, maternal grandmother, and mother; Colon cancer (age of onset: 21) in her brother; Colon polyps in her brother; Diabetes in her maternal grandfather; Heart attack in her father; Heart disease in an other family member; Heart failure in her brother and father; Kidney disease in an other family member; Stroke in her mother; Transient ischemic attack in her mother.    ROS:  Please see the history of present illness.   Otherwise, review of systems is positive for none.   All other systems are reviewed and negative.   PHYSICAL EXAM: VS:  BP 108/64   Pulse 64   Ht 5\' 1"  (1.549 m)   Wt 160 lb (72.6 kg)   BMI 30.23 kg/m  , BMI Body mass index is 30.23 kg/m. GEN: Well nourished, well developed, in no acute distress  HEENT: normal  Neck: no JVD, carotid bruits, or masses Cardiac: RRR; no murmurs, rubs, or gallops,no edema  Respiratory:  clear to auscultation bilaterally, normal work of breathing GI: soft, nontender, nondistended, + BS MS: no deformity or atrophy  Skin: warm and dry Neuro:  Strength and sensation are intact Psych: euthymic mood, full affect  EKG:  EKG is ordered today. Personal review of the ekg ordered shows sinus rhythm, rate 64    Recent Labs: 6/[name]/2020: BUN <5; Creatinine, Ser 0.61; Hemoglobin 12.4; Platelets 206; Potassium 3.9; Sodium 141    Lipid Panel     Component Value Date/Time   CHOL 127 02/25/2016 0313   CHOL 129 12/07/2012 1445   TRIG 173 (H) 02/25/2016 0313   TRIG 147 12/07/2012 1445   HDL 25 (L) 02/25/2016 0313   HDL 40 12/07/2012 1445   CHOLHDL 5.1 02/25/2016 0313   VLDL 35 02/25/2016 0313   LDLCALC 67 02/25/2016 0313   LDLCALC 60 12/07/2012 1445     Wt Readings from Last 3 Encounters:  08/16/19 160 lb (72.6 kg)  01/31/19 161 lb 6 oz (  73.2 kg)  06/[name]/20 161 lb 7 oz (73.2 kg)      Other studies Reviewed: Additional studies/  records that were reviewed today include: TTE 03/02/17 Review of the above records today demonstrates:  - Left ventricle: Distal septal hypokinesis. The cavity size was   normal. Systolic function was normal. The estimated ejection   fraction was 55%. Wall motion was normal; there were no regional   wall motion abnormalities. Left ventricular diastolic function   parameters were normal. - Atrial septum: No defect or patent foramen ovale was identified. - Pulmonary arteries: PA peak pressure: 34 mm Hg (S).  05/25/18 SPECT  Nuclear stress EF: 67%.  Normal perfusion  This is a low risk study.  Cardiac monitor 06/11/2018 personally reviewed Sinus rhythm with sinus tachycardia.   Patient triggers were associated with sinus rhythm. Short runs of SVT, maximum 20 beats without symptoms  ASSESSMENT AND PLAN:  1.  Paroxysmal atrial fibrillation: Currently on Eliquis.  Status post ablation 08/03/2016.  CHA2DS2-VASc of 3.  Remains in sinus rhythm.  No changes.    2. Hypertension: Currently well controlled  3. takotsubo cardiomyopathy: Currently on Lasix and ARB currently well compensated.  No changes.  4.  Coronary artery disease: No current chest pain  Current medicines are reviewed at length with the patient today.   The patient does not have concerns regarding her medicines.  The following changes were made today: None  Labs/ tests ordered today include:  No orders of the defined types were placed in this encounter.    Disposition:   FU with Alexiah Koroma 12 months  Signed, Emerick Weatherly Meredith Leeds, MD  08/16/2019 12:07 PM     [facility] [address] [place] [address] [phone] (office) [phone] (fax)

## 2019-08-30 ENCOUNTER — Telehealth: Payer: Self-pay | Admitting: *Deleted

## 2019-08-30 NOTE — Telephone Encounter (Signed)
Patient with diagnosis of afib on Eliquis for anticoagulation.    Procedure: RIGHT REVERSE SHOULDER ARTHROPLASTY  Date of procedure: TBD  CHADS2-VASc score of  8 (CHF, HTN, AGE, DM2, stroke/tia x 2, CAD, female)  CrCl 82 ml/min  Patient is at high risk off anticoagulation due to history of stroke. Would normally advised holding 2 days. However due to high risk, I will defer to MD.

## 2019-08-30 NOTE — Telephone Encounter (Signed)
   [facility] Pre-operative Risk Assessment    Request for surgical clearance:  1. What type of surgery is being performed? RIGHT REVERSE SHOULDER ARTHROPLASTY  2. When is this surgery scheduled? TBD   3. What type of clearance is required (medical clearance vs. Pharmacy clearance to hold med vs. Both)? BOTH  4. Are there any medications that need to be held prior to surgery and how long? ELIQUIS   5. Practice name and name of physician performing surgery? [facility]; DR. Lennette Bihari SUPPLE   6. What is your office phone number [phone]    7.   What is your office fax number [phone]  8.   Anesthesia type (None, local, MAC, general) ? GENERAL   [name] [name] 08/30/2019, 12:21 PM  _________________________________________________________________   (provider comments below)

## 2019-08-30 NOTE — Telephone Encounter (Signed)
Pt is at low risk  She may hold her Eliquis for 2 days prior to surgery .   Surgery should restart her eliquis as soon as is safe at her current dose.

## 2019-08-30 NOTE — Telephone Encounter (Signed)
   Primary Cardiologist: Mertie Moores, MD  Chart reviewed as part of pre-operative protocol coverage and patient contacted on 08/30/2019 by Robbie Lis, PA-C. Given past medical history and time since last visit, based on ACC/AHA guidelines, [name] [name] would be at acceptable risk for the planned procedure without further cardiovascular testing.   Per Dr. Acie Fredrickson: "She may hold her Eliquis for 2 days prior to surgery. Surgery should restart her eliquis as soon as is safe at her current dose."  I will route this recommendation to the requesting party via [place] fax function and remove from pre-op pool.  Please call with questions.  Darreld Mclean, PA-C 08/30/2019, 3:11 PM

## 2019-08-30 NOTE — Telephone Encounter (Signed)
   Primary Cardiologist: Mertie Moores, MD  EP: Dr. Curt Bears  Chart reviewed as part of pre-operative protocol coverage. Patient was contacted 08/30/2019 in reference to pre-operative risk assessment for pending surgery as outlined below.  [name] [name] was last seen on 08/16/2019 by Dr. Curt Bears.  She was doing well. Recommended 1 yr follow up.   Therefore, based on ACC/AHA guidelines, the patient would be at acceptable risk for the planned procedure without further cardiovascular testing.   Pharmacy to review anticoagulation.  Livingston Wheeler, [place] 08/30/2019, 12:39 PM

## 2019-09-05 ENCOUNTER — Other Ambulatory Visit: Payer: Self-pay | Admitting: Cardiology

## 2019-09-05 DIAGNOSIS — I5021 Acute systolic (congestive) heart failure: Secondary | ICD-10-CM

## 2019-09-16 ENCOUNTER — Other Ambulatory Visit: Payer: Self-pay | Admitting: Cardiology

## 2019-09-17 ENCOUNTER — Other Ambulatory Visit: Payer: Self-pay | Admitting: Cardiology

## 2019-10-03 ENCOUNTER — Encounter (HOSPITAL_COMMUNITY)
Admission: RE | Admit: 2019-10-03 | Discharge: 2019-10-03 | Disposition: A | Discharge status: Home / Self Care | Payer: Medicare Other | Source: Ambulatory Visit | Attending: Orthopedic Surgery | Admitting: Orthopedic Surgery

## 2019-10-03 ENCOUNTER — Encounter (HOSPITAL_COMMUNITY): Payer: Self-pay

## 2019-10-03 ENCOUNTER — Other Ambulatory Visit: Payer: Self-pay

## 2019-10-03 DIAGNOSIS — Z01812 Encounter for preprocedural laboratory examination: Secondary | ICD-10-CM | POA: Diagnosis not present

## 2019-10-03 DIAGNOSIS — Z87891 Personal history of nicotine dependence: Secondary | ICD-10-CM | POA: Diagnosis not present

## 2019-10-03 DIAGNOSIS — N189 Chronic kidney disease, unspecified: Secondary | ICD-10-CM | POA: Diagnosis not present

## 2019-10-03 DIAGNOSIS — I252 Old myocardial infarction: Secondary | ICD-10-CM | POA: Diagnosis not present

## 2019-10-03 DIAGNOSIS — E1122 Type 2 diabetes mellitus with diabetic chronic kidney disease: Secondary | ICD-10-CM | POA: Diagnosis not present

## 2019-10-03 DIAGNOSIS — E785 Hyperlipidemia, unspecified: Secondary | ICD-10-CM | POA: Diagnosis not present

## 2019-10-03 DIAGNOSIS — Z7901 Long term (current) use of anticoagulants: Secondary | ICD-10-CM | POA: Insufficient documentation

## 2019-10-03 DIAGNOSIS — Z79899 Other long term (current) drug therapy: Secondary | ICD-10-CM | POA: Diagnosis not present

## 2019-10-03 DIAGNOSIS — I509 Heart failure, unspecified: Secondary | ICD-10-CM | POA: Insufficient documentation

## 2019-10-03 DIAGNOSIS — K219 Gastro-esophageal reflux disease without esophagitis: Secondary | ICD-10-CM | POA: Insufficient documentation

## 2019-10-03 DIAGNOSIS — I130 Hypertensive heart and chronic kidney disease with heart failure and stage 1 through stage 4 chronic kidney disease, or unspecified chronic kidney disease: Secondary | ICD-10-CM | POA: Diagnosis not present

## 2019-10-03 DIAGNOSIS — M75101 Unspecified rotator cuff tear or rupture of right shoulder, not specified as traumatic: Secondary | ICD-10-CM | POA: Diagnosis not present

## 2019-10-03 HISTORY — DX: Other specified postprocedural states: Z98.890

## 2019-10-03 HISTORY — DX: Other specified postprocedural states: R11.2

## 2019-10-03 HISTORY — DX: Other complications of anesthesia, initial encounter: T88.59XA

## 2019-10-03 HISTORY — DX: Family history of other specified conditions: Z84.89

## 2019-10-03 LAB — HEMOGLOBIN A1C
Hgb A1c MFr Bld: 6.9 % — ABNORMAL HIGH (ref 4.8–5.6)
Mean Plasma Glucose: 151.33 mg/dL

## 2019-10-03 LAB — BASIC METABOLIC PANEL
Anion gap: 11 (ref 5–15)
BUN: 10 mg/dL (ref 8–23)
CO2: 23 mmol/L (ref 22–32)
Calcium: 8.9 mg/dL (ref 8.9–10.3)
Chloride: 103 mmol/L (ref 98–111)
Creatinine, Ser: 0.67 mg/dL (ref 0.44–1.00)
GFR calc Af Amer: >60 mL/min (ref >60)
GFR calc non Af Amer: >60 mL/min (ref >60)
Glucose, Bld: 177 mg/dL — ABNORMAL HIGH (ref 70–99)
Potassium: 3 mmol/L — ABNORMAL LOW (ref 3.5–5.1)
Sodium: 137 mmol/L (ref 135–145)

## 2019-10-03 LAB — GLUCOSE, CAPILLARY: Glucose-Capillary: 194 mg/dL — ABNORMAL HIGH (ref 70–99)

## 2019-10-03 LAB — CBC
HCT: 37.5 % (ref 36.0–46.0)
Hemoglobin: 12.3 g/dL (ref 12.0–15.0)
MCH: 31.1 pg (ref 26.0–34.0)
MCHC: 32.8 g/dL (ref 30.0–36.0)
MCV: 94.7 fL (ref 80.0–100.0)
Platelets: 182 10*3/uL (ref 150–400)
RBC: 3.96 MIL/uL (ref 3.87–5.11)
RDW: 12.9 % (ref 11.5–15.5)
WBC: 6.4 10*3/uL (ref 4.0–10.5)
nRBC: 0 % (ref 0.0–0.2)

## 2019-10-03 LAB — SURGICAL PCR SCREEN
MRSA, PCR: NEGATIVE
Staphylococcus aureus: NEGATIVE

## 2019-10-03 NOTE — Patient Instructions (Addendum)
DUE TO COVID-19 ONLY ONE VISITOR IS ALLOWED TO COME WITH YOU AND STAY IN THE WAITING ROOM ONLY DURING PRE OP AND PROCEDURE DAY OF SURGERY. THE 1 VISITOR MAY VISIT WITH YOU AFTER SURGERY IN YOUR PRIVATE ROOM DURING VISITING HOURS ONLY!  YOU NEED TO HAVE A COVID 19 TEST ON_Monday 03/08/2021______ @__12 :45 pm_____, THIS TEST MUST BE DONE BEFORE SURGERY, COME  [name] [name] , [id number].  ([place]) ONCE YOUR COVID TEST IS COMPLETED, PLEASE BEGIN THE QUARANTINE INSTRUCTIONS AS OUTLINED IN YOUR HANDOUT.                [name] [name]     Your procedure is scheduled on: Thursday 10/10/2019   Report to [facility] Main  Entrance    Report to admitting at  0730  AM   How to Manage Your Diabetes Before and After Surgery  Why is it important to control my blood sugar before and after surgery? . Improving blood sugar levels before and after surgery helps healing and can limit problems. . A way of improving blood sugar control is eating a healthy diet by: o  Eating less sugar and carbohydrates o  Increasing activity/exercise o  Talking with your doctor about reaching your blood sugar goals . High blood sugars (greater than 180 mg/dL) can raise your risk of infections and slow your recovery, so you will need to focus on controlling your diabetes during the weeks before surgery. . Make sure that the doctor who takes care of your diabetes knows about your planned surgery including the date and location.  How do I manage my blood sugar before surgery? . Check your blood sugar at least 4 times a day, starting 2 days before surgery, to make sure that the level is not too high or low. o Check your blood sugar the morning of your surgery when you wake up and every 2 hours until you get to the Short Stay unit. . If your blood sugar is less than 70 mg/dL, you will need to treat for low blood sugar: o Do not take insulin. o Treat a low blood sugar (less than 70  mg/dL) with  cup of clear juice (cranberry or apple), 4 glucose tablets, OR glucose gel. o Recheck blood sugar in 15 minutes after treatment (to make sure it is greater than 70 mg/dL). If your blood sugar is not greater than 70 mg/dL on recheck, call [phone] for further instructions. . Report your blood sugar to the short stay nurse when you get to Short Stay.  . If you are admitted to the hospital after surgery: o Your blood sugar will be checked by the staff and you will probably be given insulin after surgery (instead of oral diabetes medicines) to make sure you have good blood sugar levels. o The goal for blood sugar control after surgery is 80-180 mg/dL.   WHAT DO I DO ABOUT MY DIABETES MEDICATION?        The day before surgery, Take the MORNING dose only of Glipizide (Glucotrol)! DO NOT TAKE EVENING DOSE!  [name] Do not take oral diabetes medicines (pills) the morning of surgery.     Call this number if you have problems the morning of surgery [phone]    Remember: Do not eat food  :After Midnight.     NO SOLID FOOD AFTER MIDNIGHT THE NIGHT PRIOR TO SURGERY  And   NOTHING BY MOUTH EXCEPT CLEAR LIQUIDS UNTIL  0700 am .  PLEASE FINISH Gatorade G 2 DRINK PER SURGEON ORDER  WHICH NEEDS TO BE COMPLETED AT  0700  am .   CLEAR LIQUID DIET   Foods Allowed                                                                     Foods Excluded  Coffee and tea, regular and decaf                             liquids that you cannot  Plain Jell-O any favor except red or purple                                           see through such as: Fruit ices (not with fruit pulp)                                     milk, soups, orange juice  Iced Popsicles                                    All solid food Carbonated beverages, regular and diet                                    Cranberry, grape and apple juices Sports drinks like Gatorade Lightly seasoned clear broth or consume(fat  free) Sugar, honey syrup  Sample Menu Breakfast                                Lunch                                     Supper Cranberry juice                    Beef broth                            Chicken broth Jell-O                                     Grape juice                           Apple juice Coffee or tea                        Jell-O                                      Popsicle  Coffee or tea                        Coffee or tea  _____________________________________________________________________     BRUSH YOUR TEETH MORNING OF SURGERY AND RINSE YOUR MOUTH OUT, NO CHEWING GUM CANDY OR MINTS.     Take these medicines the morning of surgery with A SIP OF WATER: Dexlansoprazole (Dexilant), Singulair, use Albuterol inhaler if needed, use Proair inhaler if needed, and use Spiriva inhaler if needed  and bring inhalers to the hospital    DO NOT [place]!                               You may not have any metal on your body including hair pins and              piercings  Do not wear jewelry, make-up, lotions, powders or perfumes, deodorant             Do not wear nail polish on your fingernails.  Do not shave  48 hours prior to surgery.                 Do not bring valuables to the hospital. [name].  Contacts, dentures or bridgework may not be worn into surgery.  Leave suitcase in the car. After surgery it may be brought to your room.                   Please read over the following fact sheets you were given: _____________________________________________________________________  [facility]- Preparing for Total Shoulder Arthroplasty    Before surgery, you can play an important role. Because skin is not sterile, your skin needs to be as free of germs as possible. You can reduce the number of germs on your skin by using the  following products. . Benzoyl Peroxide Gel o Reduces the number of germs present on the skin o Applied twice a day to shoulder area starting two days before surgery    ==================================================================  Please follow these instructions carefully:  BENZOYL PEROXIDE 5% GEL  Please do not use if you have an allergy to benzoyl peroxide.   If your skin becomes reddened/irritated stop using the benzoyl peroxide.  Starting two days before surgery, apply as follows: 1. Apply benzoyl peroxide in the morning and at night. Apply after taking a shower. If you are not taking a shower clean entire shoulder front, back, and side along with the armpit with a clean wet washcloth.  2. Place a quarter-sized dollop on your shoulder and rub in thoroughly, making sure to cover the front, back, and side of your shoulder, along with the armpit.   2 days before __x__ AM   _x__ PM              1 day before _x___ AM   _x___ PM                         3. Do this twice a day for two days.  (Last application is the night before surgery, AFTER using the CHG soap as described below).  4. Do NOT apply benzoyl peroxide gel on the day of surgery.  [name] - Preparing for Surgery Before surgery, you can play an important role.  Because skin is not sterile, your skin needs to be as free of germs as possible.  You can reduce the number of germs on your skin by washing with CHG (chlorahexidine gluconate) soap before surgery.  CHG is an antiseptic cleaner which kills germs and bonds with the skin to continue killing germs even after washing. Please DO NOT use if you have an allergy to CHG or antibacterial soaps.  If your skin becomes reddened/irritated stop using the CHG and inform your nurse when you arrive at Short Stay. Do not shave (including legs and underarms) for at least 48 hours prior to the first CHG shower.  You may shave your face/neck. Please follow these  instructions carefully:  1.  Shower with CHG Soap the night before surgery and the  morning of Surgery.  2.  If you choose to wash your hair, wash your hair first as usual with your  normal  shampoo.  3.  After you shampoo, rinse your hair and body thoroughly to remove the  shampoo.                           4.  Use CHG as you would any other liquid soap.  You can apply chg directly  to the skin and wash                       Gently with a scrungie or clean washcloth.  5.  Apply the CHG Soap to your body ONLY FROM THE NECK DOWN.   Do not use on face/ open                           Wound or open sores. Avoid contact with eyes, ears mouth and genitals (private parts).                       Wash face,  Genitals (private parts) with your normal soap.             6.  Wash thoroughly, paying special attention to the area where your surgery  will be performed.  7.  Thoroughly rinse your body with warm water from the neck down.  8.  DO NOT shower/wash with your normal soap after using and rinsing off  the CHG Soap.                9.  Pat yourself dry with a clean towel.            10.  Wear clean pajamas.            11.  Place clean sheets on your bed the night of your first shower and do not  sleep with pets. Day of Surgery : Do not apply any lotions/deodorants the morning of surgery.  Please wear clean clothes to the hospital/surgery center.  FAILURE TO FOLLOW THESE INSTRUCTIONS MAY RESULT IN THE CANCELLATION OF YOUR SURGERY PATIENT SIGNATURE_________________________________  NURSE SIGNATURE__________________________________  ________________________________________________________________________   [name] [name]  An incentive spirometer is a tool that can help keep your lungs clear and active. This tool measures how well you are filling your lungs with each breath. Taking long deep breaths may help reverse or decrease the chance of developing breathing (pulmonary) problems (especially  infection) following:  A long period of  time when you are unable to move or be active. BEFORE THE PROCEDURE   If the spirometer includes an indicator to show your best effort, your nurse or respiratory therapist will set it to a desired goal.  If possible, sit up straight or lean slightly forward. Try not to slouch.  Hold the incentive spirometer in an upright position. INSTRUCTIONS FOR USE  1. Sit on the edge of your bed if possible, or sit up as far as you can in bed or on a chair. 2. Hold the incentive spirometer in an upright position. 3. Breathe out normally. 4. Place the mouthpiece in your mouth and seal your lips tightly around it. 5. Breathe in slowly and as deeply as possible, raising the piston or the ball toward the top of the column. 6. Hold your breath for 3-5 seconds or for as long as possible. Allow the piston or ball to fall to the bottom of the column. 7. Remove the mouthpiece from your mouth and breathe out normally. 8. Rest for a few seconds and repeat Steps 1 through 7 at least 10 times every 1-2 hours when you are awake. Take your time and take a few normal breaths between deep breaths. 9. The spirometer may include an indicator to show your best effort. Use the indicator as a goal to work toward during each repetition. 10. After each set of 10 deep breaths, practice coughing to be sure your lungs are clear. If you have an incision (the cut made at the time of surgery), support your incision when coughing by placing a pillow or rolled up towels firmly against it. Once you are able to get out of bed, walk around indoors and cough well. You may stop using the incentive spirometer when instructed by your caregiver.  RISKS AND COMPLICATIONS  Take your time so you do not get dizzy or light-headed.  If you are in pain, you may need to take or ask for pain medication before doing incentive spirometry. It is harder to take a deep breath if you are having pain. AFTER  USE  Rest and breathe slowly and easily.  It can be helpful to keep track of a log of your progress. Your caregiver can provide you with a simple table to help with this. If you are using the spirometer at home, follow these instructions: [name] [name] IF:   You are having difficultly using the spirometer.  You have trouble using the spirometer as often as instructed.  Your pain medication is not giving enough relief while using the spirometer.  You develop fever of 100.5 F (38.1 C) or higher. SEEK IMMEDIATE MEDICAL CARE IF:   You cough up bloody sputum that had not been present before.  You develop fever of 102 F (38.9 C) or greater.  You develop worsening pain at or near the incision site. MAKE SURE YOU:   Understand these instructions.  Will watch your condition.  Will get help right away if you are not doing well or get worse. Document Released: 11/28/2006 Document Revised: 10/10/2011 Document Reviewed: 01/29/2007 [facility] Patient Information 2014 [place], [place].   ________________________________________________________________________

## 2019-10-03 NOTE — Progress Notes (Signed)
Unable to reach patient on cell phone or home phone as no voicemail has been set up yet and cannot leave a message.

## 2019-10-03 NOTE — Progress Notes (Addendum)
PCP - Dr. Velna Hatchet Cardiologist - Dr. Mertie Moores  Clearance from [name] [name], [place] on 08/30/2019 epic EP physician-Dr. Reggy Eye  LOV-08/16/2019 Pulmonary- Dr. Baird Lyons  LOV-01/24/2019  epic  Chest x-ray - n/a EKG - 08/16/2019 epic Stress Test - 05/25/2018 epic ECHO - 03/19/2018 epic Cardiac Cath - 2018 epic 08/03/2016- had atrail fib ablation EP study  epic  Sleep Study - 05/17/2016-epic CPAP - n/a  Fasting Blood Sugar - 97-128 Checks Blood Sugar ___1__ times a day  Blood Thinner Instructions:Eliquis  per [name] Rives, PA on 08/30/2019- to Hold Eliquis 2 days prior to surgery. 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  Patient denies shortness of breath, fever, cough and chest pain at PAT appointment   Patient verbalized understanding of instructions that were given to them at the PAT appointment. Patient was also instructed that they will need to review over the PAT instructions again at home before surgery.

## 2019-10-07 ENCOUNTER — Other Ambulatory Visit (HOSPITAL_COMMUNITY)
Admission: RE | Admit: 2019-10-07 | Discharge: 2019-10-07 | Disposition: A | Discharge status: Home / Self Care | Payer: Medicare Other | Source: Ambulatory Visit | Attending: Orthopedic Surgery | Admitting: Orthopedic Surgery

## 2019-10-07 DIAGNOSIS — Z01812 Encounter for preprocedural laboratory examination: Secondary | ICD-10-CM | POA: Diagnosis present

## 2019-10-07 DIAGNOSIS — Z20822 Contact with and (suspected) exposure to covid-19: Secondary | ICD-10-CM | POA: Diagnosis not present

## 2019-10-07 LAB — SARS CORONAVIRUS 2 (TAT 6-24 HRS): SARS Coronavirus 2: NEGATIVE

## 2019-10-10 ENCOUNTER — Encounter (HOSPITAL_COMMUNITY): Payer: Self-pay | Admitting: Orthopedic Surgery

## 2019-10-10 ENCOUNTER — Ambulatory Visit (HOSPITAL_COMMUNITY)
Admission: RE | Admit: 2019-10-10 | Discharge: 2019-10-10 | Disposition: A | Discharge status: Home / Self Care | Payer: Medicare Other | Attending: Orthopedic Surgery | Admitting: Orthopedic Surgery

## 2019-10-10 ENCOUNTER — Ambulatory Visit (HOSPITAL_COMMUNITY): Payer: Medicare Other | Admitting: Certified Registered Nurse Anesthetist

## 2019-10-10 ENCOUNTER — Ambulatory Visit (HOSPITAL_COMMUNITY): Payer: Medicare Other | Admitting: Physician Assistant

## 2019-10-10 ENCOUNTER — Encounter (HOSPITAL_COMMUNITY)
Admission: RE | Disposition: A | Discharge status: Home / Self Care | Payer: Self-pay | Source: Home / Self Care | Attending: Orthopedic Surgery

## 2019-10-10 DIAGNOSIS — I252 Old myocardial infarction: Secondary | ICD-10-CM | POA: Diagnosis not present

## 2019-10-10 DIAGNOSIS — Z8249 Family history of ischemic heart disease and other diseases of the circulatory system: Secondary | ICD-10-CM | POA: Insufficient documentation

## 2019-10-10 DIAGNOSIS — Z8673 Personal history of transient ischemic attack (TIA), and cerebral infarction without residual deficits: Secondary | ICD-10-CM | POA: Diagnosis not present

## 2019-10-10 DIAGNOSIS — I4891 Unspecified atrial fibrillation: Secondary | ICD-10-CM | POA: Insufficient documentation

## 2019-10-10 DIAGNOSIS — M75101 Unspecified rotator cuff tear or rupture of right shoulder, not specified as traumatic: Secondary | ICD-10-CM | POA: Diagnosis present

## 2019-10-10 DIAGNOSIS — Z7984 Long term (current) use of oral hypoglycemic drugs: Secondary | ICD-10-CM | POA: Diagnosis not present

## 2019-10-10 DIAGNOSIS — K219 Gastro-esophageal reflux disease without esophagitis: Secondary | ICD-10-CM | POA: Diagnosis not present

## 2019-10-10 DIAGNOSIS — M797 Fibromyalgia: Secondary | ICD-10-CM | POA: Insufficient documentation

## 2019-10-10 DIAGNOSIS — Z87891 Personal history of nicotine dependence: Secondary | ICD-10-CM | POA: Diagnosis not present

## 2019-10-10 DIAGNOSIS — J449 Chronic obstructive pulmonary disease, unspecified: Secondary | ICD-10-CM | POA: Diagnosis not present

## 2019-10-10 DIAGNOSIS — Z79899 Other long term (current) drug therapy: Secondary | ICD-10-CM | POA: Diagnosis not present

## 2019-10-10 DIAGNOSIS — I509 Heart failure, unspecified: Secondary | ICD-10-CM | POA: Diagnosis not present

## 2019-10-10 DIAGNOSIS — Z7901 Long term (current) use of anticoagulants: Secondary | ICD-10-CM | POA: Insufficient documentation

## 2019-10-10 DIAGNOSIS — M1990 Unspecified osteoarthritis, unspecified site: Secondary | ICD-10-CM | POA: Diagnosis not present

## 2019-10-10 DIAGNOSIS — E785 Hyperlipidemia, unspecified: Secondary | ICD-10-CM | POA: Diagnosis not present

## 2019-10-10 DIAGNOSIS — I130 Hypertensive heart and chronic kidney disease with heart failure and stage 1 through stage 4 chronic kidney disease, or unspecified chronic kidney disease: Secondary | ICD-10-CM | POA: Insufficient documentation

## 2019-10-10 DIAGNOSIS — M25711 Osteophyte, right shoulder: Secondary | ICD-10-CM | POA: Diagnosis not present

## 2019-10-10 DIAGNOSIS — F319 Bipolar disorder, unspecified: Secondary | ICD-10-CM | POA: Diagnosis not present

## 2019-10-10 DIAGNOSIS — Z96611 Presence of right artificial shoulder joint: Secondary | ICD-10-CM

## 2019-10-10 DIAGNOSIS — F419 Anxiety disorder, unspecified: Secondary | ICD-10-CM | POA: Diagnosis not present

## 2019-10-10 DIAGNOSIS — E1122 Type 2 diabetes mellitus with diabetic chronic kidney disease: Secondary | ICD-10-CM | POA: Insufficient documentation

## 2019-10-10 DIAGNOSIS — Z905 Acquired absence of kidney: Secondary | ICD-10-CM | POA: Insufficient documentation

## 2019-10-10 DIAGNOSIS — M19011 Primary osteoarthritis, right shoulder: Secondary | ICD-10-CM | POA: Insufficient documentation

## 2019-10-10 DIAGNOSIS — Z96653 Presence of artificial knee joint, bilateral: Secondary | ICD-10-CM | POA: Insufficient documentation

## 2019-10-10 DIAGNOSIS — Z833 Family history of diabetes mellitus: Secondary | ICD-10-CM | POA: Insufficient documentation

## 2019-10-10 DIAGNOSIS — N189 Chronic kidney disease, unspecified: Secondary | ICD-10-CM | POA: Diagnosis not present

## 2019-10-10 HISTORY — PX: REVERSE SHOULDER ARTHROPLASTY: SHX5054

## 2019-10-10 LAB — GLUCOSE, CAPILLARY
Glucose-Capillary: 158 mg/dL — ABNORMAL HIGH (ref 70–99)
Glucose-Capillary: 133 mg/dL — ABNORMAL HIGH (ref 70–99)

## 2019-10-10 SURGERY — ARTHROPLASTY, SHOULDER, TOTAL, REVERSE
Anesthesia: General | Site: Shoulder | Laterality: Right

## 2019-10-10 MED ORDER — MIDAZOLAM HCL 2 MG/2ML IJ SOLN
1.0000 mg | INTRAMUSCULAR | Status: DC
  Administered 2019-10-10: 1 mg via INTRAVENOUS
  Filled 2019-10-10: qty 2

## 2019-10-10 MED ORDER — VANCOMYCIN HCL IN DEXTROSE 1-5 GM/200ML-% IV SOLN
1000.0000 mg | INTRAVENOUS | Status: AC
  Administered 2019-10-10: 1000 mg via INTRAVENOUS
  Filled 2019-10-10: qty 200

## 2019-10-10 MED ORDER — ONDANSETRON HCL 4 MG/2ML IJ SOLN
INTRAMUSCULAR | Status: DC | PRN
  Administered 2019-10-10: 4 mg via INTRAVENOUS

## 2019-10-10 MED ORDER — VANCOMYCIN HCL 1000 MG IV SOLR
INTRAVENOUS | Status: AC
  Filled 2019-10-10: qty 1000

## 2019-10-10 MED ORDER — FENTANYL CITRATE (PF) 100 MCG/2ML IJ SOLN
50.0000 ug | INTRAMUSCULAR | Status: DC
  Administered 2019-10-10: 50 ug via INTRAVENOUS
  Filled 2019-10-10: qty 2

## 2019-10-10 MED ORDER — BUPIVACAINE HCL (PF) 0.5 % IJ SOLN
INTRAMUSCULAR | Status: DC | PRN
  Administered 2019-10-10: 15 mL via PERINEURAL

## 2019-10-10 MED ORDER — LACTATED RINGERS IV SOLN: INTRAVENOUS | Status: DC

## 2019-10-10 MED ORDER — CHLORHEXIDINE GLUCONATE 4 % EX LIQD: 60.0000 mL | Freq: Once | CUTANEOUS | Status: DC

## 2019-10-10 MED ORDER — LIDOCAINE 2% (20 MG/ML) 5 ML SYRINGE
INTRAMUSCULAR | Status: DC | PRN
  Administered 2019-10-10: 100 mg via INTRAVENOUS

## 2019-10-10 MED ORDER — PROPOFOL 10 MG/ML IV BOLUS
INTRAVENOUS | Status: DC | PRN
  Administered 2019-10-10: 110 mg via INTRAVENOUS

## 2019-10-10 MED ORDER — TRANEXAMIC ACID-NACL 1000-0.7 MG/100ML-% IV SOLN
1000.0000 mg | INTRAVENOUS | Status: AC
  Administered 2019-10-10: 10:00:00 1000 mg via INTRAVENOUS
  Filled 2019-10-10: qty 100

## 2019-10-10 MED ORDER — OXYCODONE-ACETAMINOPHEN 5-325 MG PO TABS: 1.0000 | ORAL_TABLET | ORAL | 0 refills | Status: DC | PRN

## 2019-10-10 MED ORDER — DEXAMETHASONE SODIUM PHOSPHATE 10 MG/ML IJ SOLN
INTRAMUSCULAR | Status: DC | PRN
  Administered 2019-10-10: 5 mg via INTRAVENOUS

## 2019-10-10 MED ORDER — ONDANSETRON HCL 4 MG/2ML IJ SOLN
INTRAMUSCULAR | Status: AC
  Filled 2019-10-10: qty 2

## 2019-10-10 MED ORDER — SUGAMMADEX SODIUM 200 MG/2ML IV SOLN
INTRAVENOUS | Status: DC | PRN
  Administered 2019-10-10: 200 mg via INTRAVENOUS

## 2019-10-10 MED ORDER — TRAMADOL HCL 50 MG PO TABS: 50.0000 mg | ORAL_TABLET | Freq: Four times a day (QID) | ORAL | 0 refills | Status: DC | PRN

## 2019-10-10 MED ORDER — DEXAMETHASONE SODIUM PHOSPHATE 10 MG/ML IJ SOLN
INTRAMUSCULAR | Status: AC
  Filled 2019-10-10: qty 1

## 2019-10-10 MED ORDER — CYCLOBENZAPRINE HCL 10 MG PO TABS: 10.0000 mg | ORAL_TABLET | Freq: Three times a day (TID) | ORAL | 1 refills | Status: DC | PRN

## 2019-10-10 MED ORDER — VANCOMYCIN HCL 1000 MG IV SOLR
INTRAVENOUS | Status: DC | PRN
  Administered 2019-10-10: 1000 mg via TOPICAL

## 2019-10-10 MED ORDER — PROMETHAZINE HCL 25 MG/ML IJ SOLN: 6.2500 mg | INTRAMUSCULAR | Status: DC | PRN

## 2019-10-10 MED ORDER — STERILE WATER FOR IRRIGATION IR SOLN
Status: DC | PRN
  Administered 2019-10-10: 2000 mL

## 2019-10-10 MED ORDER — FENTANYL CITRATE (PF) 100 MCG/2ML IJ SOLN: 25.0000 ug | INTRAMUSCULAR | Status: DC | PRN

## 2019-10-10 MED ORDER — FENTANYL CITRATE (PF) 100 MCG/2ML IJ SOLN
INTRAMUSCULAR | Status: DC | PRN
  Administered 2019-10-10 (×4): 25 ug via INTRAVENOUS

## 2019-10-10 MED ORDER — ROCURONIUM BROMIDE 50 MG/5ML IV SOSY
PREFILLED_SYRINGE | INTRAVENOUS | Status: DC | PRN
  Administered 2019-10-10: 60 mg via INTRAVENOUS

## 2019-10-10 MED ORDER — PHENYLEPHRINE HCL-NACL 10-0.9 MG/250ML-% IV SOLN
INTRAVENOUS | Status: DC | PRN
  Administered 2019-10-10: 40 ug/min via INTRAVENOUS

## 2019-10-10 MED ORDER — LIDOCAINE 2% (20 MG/ML) 5 ML SYRINGE
INTRAMUSCULAR | Status: AC
  Filled 2019-10-10: qty 5

## 2019-10-10 MED ORDER — PROPOFOL 10 MG/ML IV BOLUS
INTRAVENOUS | Status: AC
  Filled 2019-10-10: qty 20

## 2019-10-10 MED ORDER — ONDANSETRON HCL 4 MG PO TABS: 4.0000 mg | ORAL_TABLET | Freq: Three times a day (TID) | ORAL | 0 refills | Status: DC | PRN

## 2019-10-10 MED ORDER — ROCURONIUM BROMIDE 10 MG/ML (PF) SYRINGE
PREFILLED_SYRINGE | INTRAVENOUS | Status: AC
  Filled 2019-10-10: qty 10

## 2019-10-10 MED ORDER — BUPIVACAINE LIPOSOME 1.3 % IJ SUSP
INTRAMUSCULAR | Status: DC | PRN
  Administered 2019-10-10: 10 mL via PERINEURAL

## 2019-10-10 MED ORDER — 0.9 % SODIUM CHLORIDE (POUR BTL) OPTIME
TOPICAL | Status: DC | PRN
  Administered 2019-10-10: 1000 mL

## 2019-10-10 MED ORDER — FENTANYL CITRATE (PF) 100 MCG/2ML IJ SOLN
INTRAMUSCULAR | Status: AC
  Filled 2019-10-10: qty 2

## 2019-10-10 SURGICAL SUPPLY — 76 items
ADH SKN CLS APL DERMABOND .7 (GAUZE/BANDAGES/DRESSINGS) ×1
AID PSTN UNV HD RSTRNT DISP (MISCELLANEOUS) ×1
BAG SPEC THK2 15X12 ZIP CLS (MISCELLANEOUS) ×1
BAG ZIPLOCK 12X15 (MISCELLANEOUS) ×2 IMPLANT
BLADE SAW SGTL 83.5X18.5 (BLADE) ×2 IMPLANT
BSPLAT GLND +2X24 MDLR (Joint) ×1 IMPLANT
COOLER ICEMAN CLASSIC (MISCELLANEOUS) ×1 IMPLANT
COVER BACK TABLE 60X90IN (DRAPES) ×2 IMPLANT
COVER SURGICAL LIGHT HANDLE (MISCELLANEOUS) ×2 IMPLANT
COVER WAND RF STERILE (DRAPES) ×1 IMPLANT
CUP SUT UNIV REVERS 36+2 RT (Cup) ×1 IMPLANT
DERMABOND ADVANCED (GAUZE/BANDAGES/DRESSINGS) ×1
DERMABOND ADVANCED .7 DNX12 (GAUZE/BANDAGES/DRESSINGS) ×1 IMPLANT
DRAPE INCISE IOBAN 66X45 STRL (DRAPES) ×1 IMPLANT
DRAPE ORTHO SPLIT 77X108 STRL (DRAPES) ×4
DRAPE SHEET LG 3/4 BI-LAMINATE (DRAPES) ×2 IMPLANT
DRAPE SURG 17X11 SM STRL (DRAPES) ×2 IMPLANT
DRAPE SURG ORHT 6 SPLT 77X108 (DRAPES) ×2 IMPLANT
DRAPE U-SHAPE 47X51 STRL (DRAPES) ×2 IMPLANT
DRESSING AQUACEL AG SP 3.5X10 (GAUZE/BANDAGES/DRESSINGS) IMPLANT
DRSG AQUACEL AG ADV 3.5X10 (GAUZE/BANDAGES/DRESSINGS) ×2 IMPLANT
DRSG AQUACEL AG SP 3.5X10 (GAUZE/BANDAGES/DRESSINGS) ×2
DURAPREP 26ML APPLICATOR (WOUND CARE) ×2 IMPLANT
ELECT BLADE TIP CTD 4 INCH (ELECTRODE) ×2 IMPLANT
ELECT REM PT RETURN 15FT ADLT (MISCELLANEOUS) ×2 IMPLANT
FACESHIELD WRAPAROUND (MASK) ×8 IMPLANT
FACESHIELD WRAPAROUND OR TEAM (MASK) ×4 IMPLANT
GLENOID UNI REV MOD 24 +2 LAT (Joint) ×1 IMPLANT
GLENOSPHERE 36 +4 LAT/24 (Joint) ×1 IMPLANT
GLOVE BIO SURGEON STRL SZ7.5 (GLOVE) ×2 IMPLANT
GLOVE BIO SURGEON STRL SZ8 (GLOVE) ×2 IMPLANT
GLOVE SS BIOGEL STRL SZ 7 (GLOVE) ×1 IMPLANT
GLOVE SS BIOGEL STRL SZ 7.5 (GLOVE) ×1 IMPLANT
GLOVE SUPERSENSE BIOGEL SZ 7 (GLOVE) ×1
GLOVE SUPERSENSE BIOGEL SZ 7.5 (GLOVE) ×1
GLOVE SURG SYN 7.0 (GLOVE) ×2 IMPLANT
GLOVE SURG SYN 7.0 PF PI (GLOVE) IMPLANT
GLOVE SURG SYN 7.5  E (GLOVE) ×2
GLOVE SURG SYN 7.5 E (GLOVE) ×1 IMPLANT
GLOVE SURG SYN 7.5 PF PI (GLOVE) IMPLANT
GLOVE SURG SYN 8.0 (GLOVE) ×2 IMPLANT
GLOVE SURG SYN 8.0 PF PI (GLOVE) IMPLANT
GOWN STRL REUS W/TWL LRG LVL3 (GOWN DISPOSABLE) ×4 IMPLANT
KIT BASIN OR (CUSTOM PROCEDURE TRAY) ×2 IMPLANT
KIT TURNOVER KIT A (KITS) IMPLANT
LINER HUMERAL 36 +3MM SM (Shoulder) ×1 IMPLANT
MANIFOLD NEPTUNE II (INSTRUMENTS) ×2 IMPLANT
NDL TAPERED W/ NITINOL LOOP (MISCELLANEOUS) ×1 IMPLANT
NEEDLE TAPERED W/ NITINOL LOOP (MISCELLANEOUS) ×2 IMPLANT
NS IRRIG 1000ML POUR BTL (IV SOLUTION) ×2 IMPLANT
PACK SHOULDER (CUSTOM PROCEDURE TRAY) ×2 IMPLANT
PAD ARMBOARD 7.5X6 YLW CONV (MISCELLANEOUS) ×2 IMPLANT
PAD COLD SHLDR WRAP-ON (PAD) ×1 IMPLANT
PIN SET MODULAR GLENOID SYSTEM (PIN) ×1 IMPLANT
RESTRAINT HEAD UNIVERSAL NS (MISCELLANEOUS) ×2 IMPLANT
SCREW CENTRAL MOD 30MM (Screw) ×1 IMPLANT
SCREW PERI LOCK 5.5X16 (Screw) ×2 IMPLANT
SCREW PERIPHERAL 5.5X20 LOCK (Screw) ×1 IMPLANT
SCREW PERIPHERAL 5.5X28 LOCK (Screw) ×1 IMPLANT
SLING ARM FOAM STRAP LRG (SOFTGOODS) IMPLANT
SLING ARM FOAM STRAP MED (SOFTGOODS) ×2 IMPLANT
SPONGE LAP 18X18 RF (DISPOSABLE) ×1 IMPLANT
STEM HUMERAL MOD SZ 5 135 DEG (Stem) ×1 IMPLANT
SUCTION FRAZIER HANDLE 12FR (TUBING) ×2
SUCTION TUBE FRAZIER 12FR DISP (TUBING) ×1 IMPLANT
SUT FIBERWIRE #2 38 T-5 BLUE (SUTURE)
SUT MNCRL AB 3-0 PS2 18 (SUTURE) ×2 IMPLANT
SUT MON AB 2-0 CT1 36 (SUTURE) ×2 IMPLANT
SUT VIC AB 1 CT1 36 (SUTURE) ×2 IMPLANT
SUTURE FIBERWR #2 38 T-5 BLUE (SUTURE) IMPLANT
SUTURE TAPE 1.3 40 TPR END (SUTURE) ×2 IMPLANT
SUTURETAPE 1.3 40 TPR END (SUTURE) ×4
TOWEL OR 17X26 10 PK STRL BLUE (TOWEL DISPOSABLE) ×2 IMPLANT
TOWEL OR NON WOVEN STRL DISP B (DISPOSABLE) ×2 IMPLANT
WATER STERILE IRR 1000ML POUR (IV SOLUTION) ×4 IMPLANT
YANKAUER SUCT BULB TIP 10FT TU (MISCELLANEOUS) ×2 IMPLANT

## 2019-10-10 NOTE — Anesthesia Preprocedure Evaluation (Signed)
Anesthesia Evaluation  Patient identified by MRN, date of birth, ID band Patient awake    Reviewed: Allergy & Precautions, NPO status , Patient's Chart, lab work & pertinent test results  History of Anesthesia Complications (+) PONV  Airway Mallampati: II  TM Distance: >3 FB Neck ROM: Full    Dental no notable dental hx.    Pulmonary COPD, former smoker,    Pulmonary exam normal breath sounds clear to auscultation       Cardiovascular hypertension, negative cardio ROS Normal cardiovascular exam Rhythm:Regular Rate:Normal     Neuro/Psych Bipolar Disorder CVA, Residual Symptoms    GI/Hepatic negative GI ROS, Neg liver ROS,   Endo/Other  negative endocrine ROSdiabetes  Renal/GU negative Renal ROS  negative genitourinary   Musculoskeletal negative musculoskeletal ROS (+)   Abdominal   Peds negative pediatric ROS (+)  Hematology negative hematology ROS (+)   Anesthesia Other Findings   Reproductive/Obstetrics negative OB ROS                             Anesthesia Physical Anesthesia Plan  ASA: III  Anesthesia Plan: General   Post-op Pain Management:  Regional for Post-op pain   Induction: Intravenous  PONV Risk Score and Plan: 4 or greater and Ondansetron, Dexamethasone, Treatment may vary due to age or medical condition and Midazolam  Airway Management Planned: Oral ETT  Additional Equipment:   Intra-op Plan:   Post-operative Plan: Extubation in OR  Informed Consent: I have reviewed the patients History and Physical, chart, labs and discussed the procedure including the risks, benefits and alternatives for the proposed anesthesia with the patient or authorized representative who has indicated his/her understanding and acceptance.     Dental advisory given  Plan Discussed with: CRNA and Surgeon  Anesthesia Plan Comments:         Anesthesia Quick Evaluation

## 2019-10-10 NOTE — Anesthesia Procedure Notes (Signed)
Procedure Name: Intubation Date/Time: 10/10/2019 10:18 AM Performed by: West Pugh, CRNA Pre-anesthesia Checklist: Patient identified, Emergency Drugs available, Suction available, Patient being monitored and Timeout performed Patient Re-evaluated:Patient Re-evaluated prior to induction Oxygen Delivery Method: Circle system utilized Preoxygenation: Pre-oxygenation with 100% oxygen Induction Type: IV induction Ventilation: Mask ventilation without difficulty and Oral airway inserted - appropriate to patient size Laryngoscope Size: Mac and 3 Grade View: Grade I Tube type: Oral Tube size: 7.0 mm Number of attempts: 1 Airway Equipment and Method: Stylet Placement Confirmation: ETT inserted through vocal cords under direct vision,  positive ETCO2,  CO2 detector and breath sounds checked- equal and bilateral Secured at: 20 cm Tube secured with: Tape Dental Injury: Teeth and Oropharynx as per pre-operative assessment  Comments: Head,neck and spine maintained in neutral alignment throughout procedure. AOI

## 2019-10-10 NOTE — Progress Notes (Signed)
Occupational Therapy Evaluation Patient Details Name: KEYLEE VANNORMAN MRN: [id number] DOB: Dec 04, 1950 Today's Date: 10/10/2019    History of Present Illness The patient is a 69 y.o. female with chronic and progressively increasing right shoulder pain related to advanced osteoarthritis and associated chronic rotator cuff tear.  Due to her increasing functional imitations and failure to respond to conservative management she is brought to the operating this time for planned right shoulder reverse arthroplasty   Clinical Impression   Completed all education regarding compensatory strategies and management of R UE per protocol. Written information provided and reviewed. Pt verbalized and demonstrated understanding. Pt to follow up with Dr Onnie Graham for further therapy needs.    Follow Up Recommendations  Follow surgeon's recommendation for DC plan and follow-up therapies;Supervision - Intermittent    Equipment Recommendations  None recommended by OT    Recommendations for Other Services       Precautions / Restrictions Precautions Precautions: Fall;Shoulder Type of Shoulder Precautions: NO IR, AROM ER 30, ABD 45, SF 60 degrees respectively Shoulder Interventions: Shoulder sling/immobilizer;Off for dressing/bathing/exercises;At all times Precaution Booklet Issued: Yes (comment)(Handout) Precaution Comments: NWB R UE Restrictions Weight Bearing Restrictions: Yes RUE Weight Bearing: Non weight bearing Other Position/Activity Restrictions: SLING off only for ADL tasks and exercises only      Mobility Bed Mobility               General bed mobility comments: patient sitting in recliner upon entrance into room   Transfers Overall transfer level: Modified independent                    Balance Overall balance assessment: No apparent balance deficits (not formally assessed)                                         ADL either performed or assessed with  clinical judgement   ADL Overall ADL's : Needs assistance/impaired Eating/Feeding: Set up   Grooming: Set up   Upper Body Bathing: Minimal assistance   Lower Body Bathing: Minimal assistance   Upper Body Dressing : Minimal assistance   Lower Body Dressing: Minimal assistance   Toilet Transfer: Modified Independent   Toileting- Clothing Manipulation and Hygiene: Minimal assistance   Tub/ Shower Transfer: Min guard   Functional mobility during ADLs: Modified independent       Vision Baseline Vision/History: Wears glasses Wears Glasses: At all times       Perception     Praxis      Pertinent Vitals/Pain Pain Assessment: No/denies pain     Hand Dominance Right(Amidexterous)   Extremity/Trunk Assessment Upper Extremity Assessment Upper Extremity Assessment: RUE deficits/detail RUE: Unable to fully assess due to immobilization   Lower Extremity Assessment Lower Extremity Assessment: Overall WFL for tasks assessed       Communication Communication Communication: No difficulties   Cognition Arousal/Alertness: Awake/alert Behavior During Therapy: WFL for tasks assessed/performed Overall Cognitive Status: Within Functional Limits for tasks assessed                                     General Comments       Exercises Exercises: Shoulder;Hand exercises Shoulder Exercises Pendulum Exercise: 5 reps;PROM Elbow Flexion: AAROM Elbow Extension: AAROM Wrist Flexion: AAROM Wrist Extension: AROM Digit Composite Flexion: AROM Composite Extension: AROM Hand  Exercises Forearm Supination: AAROM Forearm Pronation: AAROM   Shoulder Instructions Shoulder Instructions Donning/doffing shirt without moving shoulder: Minimal assistance Method for sponge bathing under operated UE: Minimal assistance Donning/doffing sling/immobilizer: Minimal assistance Correct positioning of sling/immobilizer: Moderate assistance Pendulum exercises (written home exercise  program): Supervision/safety ROM for elbow, wrist and digits of operated UE: Supervision/safety Sling wearing schedule (on at all times/off for ADL's): Modified independent Proper positioning of operated UE when showering: Supervision/safety Positioning of UE while sleeping: Supervision/safety;Set-up    Home Living Family/patient expects to be discharged to:: Private residence Living Arrangements: Alone Available Help at Discharge: Family;Available 24 hours/day Type of Home: House Home Access: Stairs to enter [employer] of Steps: 2 Entrance Stairs-Rails: Right Home Layout: One level     Bathroom Shower/Tub: Occupational psychologist: Handicapped height     Home Equipment: Shower seat - built in   Additional Comments: discharging to mother's home       Prior Functioning/Environment Level of Independence: Independent                 OT Problem List:        OT Treatment/Interventions:      OT Goals(Current goals can be found in the care plan section)    OT Frequency:     Barriers to D/C:            Co-evaluation              AM-PAC OT "6 Clicks" Daily Activity     Outcome Measure Help from another person eating meals?: A Little Help from another person taking care of personal grooming?: A Little Help from another person toileting, which includes using toliet, bedpan, or urinal?: A Little Help from another person bathing (including washing, rinsing, drying)?: A Little Help from another person to put on and taking off regular upper body clothing?: A Little Help from another person to put on and taking off regular lower body clothing?: A Little 6 Click Score: 18   End of Session    Activity Tolerance: Patient tolerated treatment well Patient left: in chair;with call bell/phone within reach                   Time: 1335-1422 OT Time Calculation (min): 47 min Charges:  OT General Charges $OT Visit: 1 Visit OT Evaluation $OT Eval  Low Complexity: 1 Low OT Treatments $Self Care/Home Management : 8-22 mins $Therapeutic Exercise: 8-22 mins  Carleigh Buccieri OTR/L   Leno Mathes 10/10/2019, 2:37 PM

## 2019-10-10 NOTE — H&P (Signed)
[name] [name]    Chief Complaint: Right shoulder rotator cuff tear arthropathy HPI: The patient is a 69 y.o. female with chronic and progressively increasing right shoulder pain related to advanced osteoarthritis and associated chronic rotator cuff tear.  Due to her increasing functional imitations and failure to respond to conservative management she is brought to the operating this time for planned right shoulder reverse arthroplasty  Past Medical History:  Diagnosis Date  . Anemia    hx of years ago   . Anxiety   . Aortic valve disorders   . Arthritis   . Asthma   . Barrett esophagus   . Bipolar disorder ([place])   . CHF (congestive heart failure) ([place])   . Chronic bronchitis ([place])   . Chronic kidney disease    "I don't have but 1" (08/03/2016)  . Complication of anesthesia   . COPD (chronic obstructive pulmonary disease) ([place])    "just have a touch" (08/03/2016)  . Depression   . Esophageal reflux   . Esophageal stricture   . Family history of adverse reaction to anesthesia    brother had nausea and vomiting, mother has had nausea  . Fibromyalgia    "hands, knees, elbows; back; think it's in my hips" (08/03/2016)  . GAD (generalized anxiety disorder)   . GERD (gastroesophageal reflux disease)   . Hiatal hernia   . History of blood transfusion 1970s; 01/08/2013   "low blood count"  . History of kidney stones 01-08-13   past hx.  . History of nuclear stress test 01/2016   low risk study  . History of stomach ulcers    "some have bleed"  . Hyperlipemia   . Hypertension 12/07/2012  . Insomnia, unspecified   . Myocardial infarction [facility]) 01-08-13   '06-Chest pain-no stent-dx. MI-stress related  . Osteoporosis   . Personal history of colonic polyps 09/2010   TUBULAR ADENOMAS (X3); NEGATIVE FOR HIGH GRADE DYSPLASIA OR MALIGNANCY.  [name] Pneumonia    "several times" (08/03/2016)  . PONV (postoperative nausea and vomiting)   . Stroke [facility])    "2 or 3"; remains with some right sided  weaknes (08/03/2016)  . Takotsubo syndrome   . Tremor, essential 03/20/2017  . Type II diabetes mellitus ([place])   . Urinary frequency    AND INCONTINENCE    Past Surgical History:  Procedure Laterality Date  . ANTERIOR CERVICAL DECOMP/DISCECTOMY FUSION N/A 01/31/2019   Procedure: ANTERIOR CERVICAL DECOMPRESSION/DISCECTOMY FUSION C5-6;  Surgeon: Melina Schools, MD;  Location: [facility];  Service: Orthopedics;  Laterality: N/A;  3 hrs  . ATRIAL FIBRILLATION ABLATION  08/03/2016  . CARDIAC CATHETERIZATION     normal per Dr Acie Fredrickson in note dated 02/06/12   . CARDIAC CATHETERIZATION     "I've had 2 or 3" (08/03/2016)  . CARPAL TUNNEL RELEASE Right   . CATARACT EXTRACTION W/ INTRAOCULAR LENS  IMPLANT, BILATERAL    . COLONOSCOPY W/ BIOPSIES AND POLYPECTOMY    . DILATION AND CURETTAGE OF UTERUS     S/P miscarriage  . ELECTROPHYSIOLOGIC STUDY N/A 08/03/2016   Procedure: Atrial Fibrillation Ablation;  Surgeon: Will Meredith Leeds, MD;  Location: [facility];  Service: Cardiovascular;  Laterality: N/A;  . ESOPHAGEAL DILATION  01-08-13   2 yrs ago  . HEEL SPUR SURGERY Left 2005  . KNEE ARTHROSCOPY  02/28/2012   Procedure: ARTHROSCOPY KNEE;  Surgeon: Tobi Bastos, MD;  Location: [facility];  Service: Orthopedics;  Laterality: Left;  . LAPAROSCOPIC CHOLECYSTECTOMY  03/2002  .  SHOULDER OPEN ROTATOR CUFF REPAIR Right 2011  . TONSILLECTOMY AND ADENOIDECTOMY  1965  . TOTAL KNEE ARTHROPLASTY Left 01/17/2013   Procedure: LEFT TOTAL KNEE ARTHROPLASTY;  Surgeon: Tobi Bastos, MD;  Location: [facility];  Service: Orthopedics;  Laterality: Left;  . TOTAL KNEE ARTHROPLASTY Right 06/19/2014   Procedure: RIGHT TOTAL KNEE ARTHROPLASTY;  Surgeon: Tobi Bastos, MD;  Location: [facility];  Service: Orthopedics;  Laterality: Right;  . TUBAL LIGATION    . VAGINAL HYSTERECTOMY     "partial"  . WRIST FLEXION TENDON TENOTOMIES AND PROXIMAL CORPECTOMY W/ WRIST ARTHRODESIS&ILIAC CREST BONE GRAFT      Family History  Problem  Relation Age of Onset  . Breast cancer Mother   . Stroke Mother   . Transient ischemic attack Mother   . Colon cancer Brother 70  . Colon polyps Brother   . Heart failure Brother   . Heart attack Father   . Heart failure Father   . Diabetes Maternal Grandfather   . Kidney disease Other        Both sides of family  . Heart disease Other        Both sides of family  . Breast cancer Maternal Aunt   . Breast cancer Maternal Grandmother     Social History:  reports that she quit smoking about 2 months ago. Her smoking use included cigarettes. She has a 25.00 pack-year smoking history. She quit smokeless tobacco use about 7 years ago.  Her smokeless tobacco use included snuff and chew. She reports current alcohol use. She reports that she does not use drugs.   Medications Prior to Admission  Medication Sig Dispense Refill  . albuterol (PROVENTIL) (2.5 MG/3ML) 0.083% nebulizer solution Take 3 mLs (2.5 mg total) by nebulization every 6 (six) hours as needed for wheezing. DX  491.9, J44.9 360 mL 12  . ALPRAZolam (XANAX) 1 MG tablet Take 1 mg by mouth at bedtime.     [name] atorvastatin (LIPITOR) 80 MG tablet Take 80 mg by mouth at bedtime.     [name] azelastine (ASTELIN) 0.1 % nasal spray Place 1 spray into both nostrils at bedtime.     . beta carotene 25000 UNIT capsule Take 25,000 Units by mouth daily.    . cholecalciferol (VITAMIN D) 1000 UNITS tablet Take 1,000 Units by mouth daily.     . cyclobenzaprine (FLEXERIL) 10 MG tablet Take 10 mg by mouth at bedtime as needed for muscle spasms.     [name] dexlansoprazole (DEXILANT) 60 MG capsule Take 60 mg by mouth daily.    [name] docusate sodium (COLACE) 100 MG capsule Take 100 mg by mouth 2 (two) times daily.    [name] ELIQUIS 5 MG TABS tablet TAKE 1 TABLET BY MOUTH TWICE DAILY (Patient taking differently: Take 5 mg by mouth 2 (two) times daily. ) 60 tablet 5  . EPINEPHrine (EPIPEN 2-PAK) 0.3 mg/0.3 mL IJ SOAJ injection USE AS DIRECTED (Patient taking differently:  Inject 0.3 mg into the muscle as needed for anaphylaxis. USE AS DIRECTED) 2 each 12  . Flaxseed, Linseed, (FLAXSEED OIL) 1200 MG CAPS Take 1,200 mg by mouth at bedtime.    . furosemide (LASIX) 20 MG tablet TAKE 1 TABLET BY MOUTH EVERY DAY (Patient taking differently: Take 20 mg by mouth daily. ) 90 tablet 3  . gabapentin (NEURONTIN) 300 MG capsule Take 300-600 mg by mouth 3 (three) times daily as needed (pain).     [name] glipiZIDE (GLUCOTROL) 5 MG tablet Take 5  mg by mouth 2 (two) times daily before a meal.    . isosorbide mononitrate (IMDUR) 30 MG 24 hr tablet TAKE 1 TABLET BY MOUTH EVERY DAY (Patient taking differently: Take 30 mg by mouth at bedtime. ) 90 tablet 3  . losartan (COZAAR) 25 MG tablet Take 12.5 mg by mouth at bedtime.     . montelukast (SINGULAIR) 10 MG tablet Take 1 tablet (10 mg total) by mouth at bedtime. (Patient taking differently: Take 10 mg by mouth daily. ) 30 tablet 11  . PARoxetine (PAXIL) 30 MG tablet Take 30 mg by mouth at bedtime.   1  . PROAIR HFA 108 (90 Base) MCG/ACT inhaler INHALE TWO PUFFS INTO THE LUNGS EVERY 6 HOURS AS NEEDED FOR WHEEZING OR SHORTNESS OF BREATH (Patient taking differently: Inhale 2 puffs into the lungs every 6 (six) hours as needed for wheezing or shortness of breath. ) 8.5 g 11  . traMADol (ULTRAM) 50 MG tablet Take 50 mg by mouth 2 (two) times daily as needed for pain.    . vitamin B-12 (CYANOCOBALAMIN) 1000 MCG tablet Take 1,000 mcg by mouth every evening.     . ondansetron (ZOFRAN) 4 MG tablet Take 1 tablet (4 mg total) by mouth every 8 (eight) hours as needed for nausea or vomiting. 20 tablet 0     Physical Exam: Right shoulder demonstrates severely restricted and painful motion with global weakness.  Examination is as documented at her recent office visits.  She does have a broad but well-healed anterolateral deltoid splitting incision from remote rotator cuff repair.  Plain radiographs confirm advanced arthritis with a high riding humeral  head.  Vitals  Temp:  [97.9 F (36.6 C)] 97.9 F (36.6 C) (03/11 0747) Pulse Rate:  [57-67] 67 (03/11 0917) Resp:  [14-27] 15 (03/11 0917) BP: (110-139)/(54-72) 139/70 (03/11 0917) SpO2:  [95 %-100 %] 100 % (03/11 0917)  Assessment/Plan  Impression: Right shoulder rotator cuff tear arthropathy  Plan of Action: Procedure(s): REVERSE SHOULDER ARTHROPLASTY  Mrk Buzby M [name] [name] 10/10/2019, 9:22 AM Contact # [phone]

## 2019-10-10 NOTE — Progress Notes (Signed)
Assisted Dr. Rose with right, ultrasound guided, interscalene  block. Side rails up, monitors on throughout procedure. See vital signs in flow sheet. Tolerated Procedure well. 

## 2019-10-10 NOTE — Anesthesia Postprocedure Evaluation (Signed)
Anesthesia Post Note  Patient: [name] [name]  Procedure(s) Performed: REVERSE SHOULDER ARTHROPLASTY (Right Shoulder)     Patient location during evaluation: PACU Anesthesia Type: General Level of consciousness: awake and alert Pain management: pain level controlled Vital Signs Assessment: post-procedure vital signs reviewed and stable Respiratory status: spontaneous breathing, nonlabored ventilation, respiratory function stable and patient connected to nasal cannula oxygen Cardiovascular status: blood pressure returned to baseline and stable Postop Assessment: no apparent nausea or vomiting Anesthetic complications: no    Last Vitals:  Vitals:   10/10/19 1315 10/10/19 1335  BP: (!) 114/51 124/64  Pulse: 72 76  Resp: 13 14  Temp:  36.7 C  SpO2: 92% 94%    Last Pain:  Vitals:   10/10/19 1335  TempSrc: Oral  PainSc:                  Shabnam Ladd S

## 2019-10-10 NOTE — Anesthesia Procedure Notes (Signed)
Anesthesia Regional Block: Interscalene brachial plexus block   Pre-Anesthetic Checklist: ,, timeout performed, Correct Patient, Correct Site, Correct Laterality, Correct Procedure, Correct Position, site marked, Risks and benefits discussed,  Surgical consent,  Pre-op evaluation,  At surgeon's request and post-op pain management  Laterality: Right  Prep: chloraprep       Needles:  Injection technique: Single-shot  Needle Type: Echogenic Needle     Needle Length: 5cm  Needle Gauge: 22     Additional Needles:   Procedures:,,,, ultrasound used (permanent image in chart),,,,  Narrative:  Start time: 10/10/2019 8:50 AM End time: 10/10/2019 8:57 AM Injection made incrementally with aspirations every 5 mL.  Performed by: Personally  Anesthesiologist: Myrtie Soman, MD  Additional Notes: Patient tolerated the procedure well without complications

## 2019-10-10 NOTE — Anesthesia Procedure Notes (Signed)
Anesthesia Procedure Image    

## 2019-10-10 NOTE — Transfer of Care (Signed)
Immediate Anesthesia Transfer of Care Note  Patient: [name] [name]  Procedure(s) Performed: REVERSE SHOULDER ARTHROPLASTY (Right Shoulder)  Patient Location: PACU  Anesthesia Type:GA combined with regional for post-op pain  Level of Consciousness: awake, alert , oriented and patient cooperative  Airway & Oxygen Therapy: Patient Spontanous Breathing and Patient connected to face mask oxygen  Post-op Assessment: Report given to RN and Post -op Vital signs reviewed and stable  Post vital signs: Reviewed and stable  Last Vitals:  Vitals Value Taken Time  BP 137/74 10/10/19 1208  Temp    Pulse 74 10/10/19 1212  Resp 19 10/10/19 1212  SpO2 93% 10/10/19 1212  Vitals shown include unvalidated device data.  Last Pain:  Vitals:   10/10/19 0908  TempSrc:   PainSc: 0-No pain      Patients Stated Pain Goal: 2 ([id number])  Complications: No apparent anesthesia complications

## 2019-10-10 NOTE — Discharge Instructions (Signed)
° °Kevin M. Supple, M.D., F.A.A.O.S. °Orthopaedic Surgery °Specializing in Arthroscopic and Reconstructive °Surgery of the Shoulder °[phone] °[address] - , [place] [address] - Fax [phone] ° ° °POST-OP TOTAL SHOULDER REPLACEMENT INSTRUCTIONS ° °1. Follow up for your first post-op appointment 10-14 days from the date of your surgery. If you do not have an appt already our office will contact you ° °2. The bandage over your incision is waterproof. You may begin showering with this dressing on. You may leave this dressing on until first follow up appointment within 2 weeks. We prefer you leave this dressing in place until follow up however after 5-7 days if you are having itching or skin irritation and would like to remove it you may do so. Go slow and tug at the borders gently to break the bond the dressing has with the skin. At this point if there is no drainage it is okay to go without a bandage or you may cover it with a light guaze and tape. You can also expect significant bruising around your shoulder that will drift down your arm and into your chest wall. This is very normal and should resolve over several days. ° ° 3. Wear your sling/immobilizer at all times except to perform the exercises below or to occasionally let your arm dangle by your side to stretch your elbow. You also need to sleep in your sling immobilizer until instructed otherwise. It is ok to remove your sling if you are sitting in a controlled environment and allow your arm to rest in a position of comfort by your side or on your lap with pillows to give your neck and skin a break from the sling. You may remove it to allow arm to dangle by side to shower. If you are up walking around and when you go to sleep at night you need to wear it. ° °4. Range of motion to your elbow, wrist, and hand are encouraged 3-5 times daily. Exercise to your hand and fingers helps to reduce swelling you may experience. ° °5. Utilize ice  to the shoulder 3-5 times minimum a day and additionally if you are experiencing pain. ° °6. Prescriptions for a pain medication and a muscle relaxant are provided for you. It is recommended that if you are experiencing pain that you pain medication alone is not controlling, add the muscle relaxant along with the pain medication which can give additional pain relief. The first 1-2 days is generally the most severe of your pain and then should gradually decrease. As your pain lessens it is recommended that you decrease your use of the pain medications to an "as needed basis'" only and to always comply with the recommended dosages of the pain medications. ° °7. Pain medications can produce constipation along with their use. If you experience this, the use of an over the counter stool softener or laxative daily is recommended.  ° °8. For additional questions or concerns, please do not hesitate to call the office. If after hours there is an answering service to forward your concerns to the physician on call. ° °9.Pain control following an exparel block ° °To help control your post-operative pain you received a nerve block  performed with Exparel which is a long acting anesthetic (numbing agent) which can provide pain relief and sensations of numbness (and relief of pain) in the operative shoulder and arm for up to 3 days. Sometimes it provides mixed relief, meaning you may still have numbness   in certain areas of the arm but can still be able to move  parts of that arm, hand, and fingers. We recommend that your prescribed pain medications  be used as needed. We do not feel it is necessary to "pre medicate" and "stay ahead" of pain.  Taking narcotic pain medications when you are not having any pain can lead to unnecessary and potentially dangerous side effects.   ° °10. Use the ice machine as much as possible in the first 5-7 days from surgery, then you can wean its use to as needed. The ice typically needs to be replaced  every 6 hours, instead of ice you can actually freeze water bottles to put in the cooler and then fill water around them to avoid having to purchase ice. You can have spare water bottles freezing to allow you to rotate them once they have melted. Try to have a thin shirt or light cloth or towel under the ice wrap to protect your skin.  ° °11.  We recommend that you avoid any dental work or cleaning in the first 3 months following your joint replacement. This is to help minimize the possibility of infection from the bacteria in your mouth that enters your bloodstream during dental work. We also recommend that you take an antibiotic prior to your dental work for the first year after your shoulder replacement to further help reduce that risk. Please simply contact our office for antibiotics to be sent to your pharmacy prior to dental work. ° °POST-OP EXERCISES ° °Pendulum Exercises ° °Perform pendulum exercises while standing and bending at the waist. Support your uninvolved arm on a table or chair and allow your operated arm to hang freely. Make sure to do these exercises passively - not using you shoulder muscles. These exercises can be performed once your nerve block effects have worn off. ° °Repeat 20 times. Do 3 sessions per day. ° ° ° ° °

## 2019-10-10 NOTE — Op Note (Signed)
10/10/2019  11:58 AM  PATIENT:   [name] [name]  69 y.o. female  PRE-OPERATIVE DIAGNOSIS:  Right shoulder rotator cuff tear arthropathy  POST-OPERATIVE DIAGNOSIS: Same  PROCEDURE: Right shoulder reverse arthroplasty utilizing an Arthrex size 5.5 stem with a posterior offset metaphysis, +3 polyethylene insert, 36/+4 glenosphere on a small/+2 baseplate  SURGEON:  Icey Tello, Metta Clines M.D.  ASSISTANTS: Jenetta Loges, PA-C  ANESTHESIA:   General endotracheal and interscalene block with Exparel  EBL: 150 cc  SPECIMEN: None  Drains: None   PATIENT DISPOSITION:  PACU - hemodynamically stable.    PLAN OF CARE: Discharge to home after PACU  Brief history:  [name] [name] is a 69 year old female who has been followed for chronic and progressively increasing right shoulder pain related to advanced rotator cuff tear arthropathy.  Due to her increasing pain and functional limitations and failure to respond to conservative management she is brought to the operating room at this time for planned right shoulder reverse arthroplasty  Preoperatively had counseled [name] [name] regarding treatment options as well as the potential risks versus benefits thereof.  Possible surgical complications were reviewed including bleeding, infection, neurovascular injury, persistent pain, loss of motion, failure of implant, anesthetic complication, and possible need for additional surgery.  She understands, and accepts, and agrees with our planned procedure.  Procedure in detail:  After undergoing routine preop evaluation patient received prophylactic antibiotics and interscalene block with Exparel was established in the holding area by the anesthesia department.  Patient subsequently placed supine on the operating table and underwent the smooth induction of a general endotracheal anesthesia.  Subsequently placed into the beachchair position and appropriately padded and protected.  The right shoulder girdle region was  sterilely prepped and draped in standard fashion.  Timeout was called.  An anterior deltopectoral approach to the right shoulder is made through 10 cm incision.  Skin flaps elevated dissection carried deeply and electrocautery was used for hemostasis.  The deltopectoral interval was then developed from proximal to distal with the vein taken laterally.  Adhesions were divided from beneath the deltoid and the conjoined tendon was mobilized and retracted medially in the upper centimeter and half of the pectoralis major was then tenotomized for exposure.  At this point the remnant of the rotator cuff was divided along the rotator interval and the subscapularis was then separated from the lesser tuberosity using electrocautery and a "peel" technique.  There was previous rupture long head biceps tendon.  The subscapularis was then tagged with a pair of suture tape sutures and then capsular attachments were divided from the anterior and infra margins of the humeral neck and the humeral head was then delivered through the wound.  An extra medullary guide was then used to outline our proposed humeral head resection which was performed with an oscillating saw at approximate 25 degrees of retroversion.  Rondure then used to remove the osteophytes on the margin of the humeral neck.  A metal cap was then placed over the cut proximal humeral surface and at this point we exposed the glenoid with appropriate retractors gaining circumferential exposure.  The remnants of the labrum were then circumferentially excised gaining complete visualization the periphery of the glenoid.  A guidepin was then directed into the center of the glenoid with an approximate 10 degree inferior tilt and the glenoid was then prepared with our central followed by the peripheral reamer to a stable subchondral bony bed and all residual bony debris was carefully removed.  The glenoid was then  terminally prepared with our central drill and tap and a 30 mm lag  screw was selected and the baseplate was then assembled and introduced with excellent fit and fixation.  All of the peripheral locking screws were then placed using standard technique with excellent purchase and fixation.  A 36/+4 glenosphere was then impacted onto the baseplate and the center lock screw was placed.  We then returned attention back to the proximal humerus where the canal was hand reamed with the 5 reamer and the 6 was very tight but we did utilize this repeatedly to open up the canal.  We then broached with a 5 followed by a 5.5 broach and her canal and metaphysis were quite tight and I did take a number of repetitions for broaching to allow her implant to seat appropriately.  Once the broach was seated we then used the posterior offset reaming guide to prepare the metaphysis.  A trial implant was placed and trial reduction showed good position and good soft tissue balance and good stability.  Our trial was then removed the final implant was assembled on the back table and we did place vancomycin powder down into the humeral canal and our implant was then terminally seated with excellent fit and fixation.  Trial reduction was then again completed in the +3 polyethylene insert gave [place] the best soft tissue balance.  The trial was removed the final polyethylene insert was then impacted and a final reduction was then performed and this showed excellent soft tissue balance good motion good stability.  The joint was copes irrigated.  The balance of vancomycin powder was then spread liberally throughout the joint.  We confirmed subscapularis had good elasticity and this was then repaired back to the eyelets on the collar of our implant using the previously placed suture tapes.  Suture limbs were then clipped.  The deltopectoral interval was then reapproximated with a series of figure-of-eight and 1 Vicryl sutures.  2-0 Vicryl used for subcu layer and intracuticular 3-0 Monocryl for the skin followed by  Dermabond and Aquacel dressing in the right arm was then placed into a sling and the patient was awakened, extubated, and taken to the recovery room in stable condition.  Jenetta Loges, PA-C was used as an Environmental consultant throughout this case essential for help with positioning the patient, positioning of extremity, tissue manipulation, implantation of the prosthesis, wound closure, and intraoperative decision-making.  Metta Clines Jaiya Mooradian MD   Contact # [phone]

## 2019-10-10 NOTE — Progress Notes (Signed)
Pt discharged in NAD, VSS, no pain. Pt discharged with all belongings, sling intact, ice machine wrap on. Pt given discharge instructions. All questions answered. Pt discharged home with brother.

## 2019-10-11 ENCOUNTER — Encounter: Payer: Self-pay | Admitting: *Deleted

## 2019-10-15 ENCOUNTER — Other Ambulatory Visit: Payer: Self-pay | Admitting: Cardiology

## 2019-10-15 DIAGNOSIS — I5021 Acute systolic (congestive) heart failure: Secondary | ICD-10-CM

## 2019-10-15 MED ORDER — LOSARTAN POTASSIUM 25 MG PO TABS: 12.5000 mg | ORAL_TABLET | Freq: Every day | ORAL | 8 refills | Status: DC

## 2019-10-15 NOTE — Telephone Encounter (Signed)
Pt's medication was sent to pt's pharmacy as requested. Confirmation received.  °

## 2019-10-15 NOTE — Telephone Encounter (Signed)
*  STAT* If patient is at the pharmacy, call can be transferred to refill team.   1. Which medications need to be refilled? (please list name of each medication and dose if known)  Need a new prescription for Losartan 25 mg  2. Which pharmacy/location (including street and city if local pharmacy) is medication to be sent to? [facility]- [phone]   3. Do they need a 30 day or 90 day supply? 30 days and refills

## 2019-10-28 ENCOUNTER — Ambulatory Visit: Payer: Medicare Other | Attending: Orthopedic Surgery | Admitting: Physical Therapy

## 2019-10-28 ENCOUNTER — Other Ambulatory Visit: Payer: Self-pay

## 2019-10-28 DIAGNOSIS — M25611 Stiffness of right shoulder, not elsewhere classified: Secondary | ICD-10-CM

## 2019-10-28 DIAGNOSIS — M25511 Pain in right shoulder: Secondary | ICD-10-CM | POA: Diagnosis present

## 2019-10-28 DIAGNOSIS — R600 Localized edema: Secondary | ICD-10-CM

## 2019-10-28 DIAGNOSIS — M6281 Muscle weakness (generalized): Secondary | ICD-10-CM | POA: Diagnosis present

## 2019-10-28 NOTE — Therapy (Signed)
[facility] [place], [place], [address] Phone: [phone]   Fax:  [phone]  Physical Therapy Evaluation  Patient Details  Name: [name] [name] MRN: [id number] Date of Birth: Jun 23, 1951 Referring Provider (PT): Dr. Justice Britain   Encounter Date: 10/28/2019  PT End of Session - 10/28/19 1545    Visit Number  1    Number of Visits  24    Date for PT Re-Evaluation  12/27/19    PT Start Time  1430    PT Stop Time  1515    PT Time Calculation (min)  45 min    Activity Tolerance  Patient tolerated treatment well    Behavior During Therapy  [facility] for tasks assessed/performed       Past Medical History:  Diagnosis Date  . Anemia    hx of years ago   . Anxiety   . Aortic valve disorders   . Arthritis   . Asthma   . Barrett esophagus   . Bipolar disorder ([place])   . CHF (congestive heart failure) ([place])   . Chronic bronchitis ([place])   . Chronic kidney disease    "I don't have but 1" (08/03/2016)  . Complication of anesthesia   . COPD (chronic obstructive pulmonary disease) ([place])    "just have a touch" (08/03/2016)  . Depression   . Esophageal reflux   . Esophageal stricture   . Family history of adverse reaction to anesthesia    brother had nausea and vomiting, mother has had nausea  . Fibromyalgia    "hands, knees, elbows; back; think it's in my hips" (08/03/2016)  . GAD (generalized anxiety disorder)   . GERD (gastroesophageal reflux disease)   . Hiatal hernia   . History of blood transfusion 1970s; 01/08/2013   "low blood count"  . History of kidney stones 01-08-13   past hx.  . History of nuclear stress test 01/2016   low risk study  . History of stomach ulcers    "some have bleed"  . Hyperlipemia   . Hypertension 12/07/2012  . Insomnia, unspecified   . Myocardial infarction [facility]) 01-08-13   '06-Chest pain-no stent-dx. MI-stress related  . Osteoporosis   . Personal history of colonic polyps 09/2010   TUBULAR  ADENOMAS (X3); NEGATIVE FOR HIGH GRADE DYSPLASIA OR MALIGNANCY.  [name] Pneumonia    "several times" (08/03/2016)  . PONV (postoperative nausea and vomiting)   . Stroke [facility])    "2 or 3"; remains with some right sided weaknes (08/03/2016)  . Takotsubo syndrome   . Tremor, essential 03/20/2017  . Type II diabetes mellitus ([place])   . Urinary frequency    AND INCONTINENCE    Past Surgical History:  Procedure Laterality Date  . ANTERIOR CERVICAL DECOMP/DISCECTOMY FUSION N/A 01/31/2019   Procedure: ANTERIOR CERVICAL DECOMPRESSION/DISCECTOMY FUSION C5-6;  Surgeon: Melina Schools, MD;  Location: [place];  Service: Orthopedics;  Laterality: N/A;  3 hrs  . ATRIAL FIBRILLATION ABLATION  08/03/2016  . CARDIAC CATHETERIZATION     normal per Dr Acie Fredrickson in note dated 02/06/12   . CARDIAC CATHETERIZATION     "I've had 2 or 3" (08/03/2016)  . CARPAL TUNNEL RELEASE Right   . CATARACT EXTRACTION W/ INTRAOCULAR LENS  IMPLANT, BILATERAL    . COLONOSCOPY W/ BIOPSIES AND POLYPECTOMY    . DILATION AND CURETTAGE OF UTERUS     S/P miscarriage  . ELECTROPHYSIOLOGIC STUDY N/A 08/03/2016   Procedure: Atrial Fibrillation Ablation;  Surgeon: [employer],  MD;  Location: [place] [facility];  Service: Cardiovascular;  Laterality: N/A;  . ESOPHAGEAL DILATION  01-08-13   2 yrs ago  . HEEL SPUR SURGERY Left 2005  . KNEE ARTHROSCOPY  02/28/2012   Procedure: ARTHROSCOPY KNEE;  Surgeon: Tobi Bastos, MD;  [facility];  Service: Orthopedics;  Laterality: Left;  . LAPAROSCOPIC CHOLECYSTECTOMY  03/2002  . REVERSE SHOULDER ARTHROPLASTY Right 10/10/2019   Procedure: REVERSE SHOULDER ARTHROPLASTY;  Surgeon: Justice Britain, MD;  [facility];  Service: Orthopedics;  Laterality: Right;  167min  . SHOULDER OPEN ROTATOR CUFF REPAIR Right 2011  . TONSILLECTOMY AND ADENOIDECTOMY  1965  . TOTAL KNEE ARTHROPLASTY Left 01/17/2013   Procedure: LEFT TOTAL KNEE ARTHROPLASTY;  Surgeon: Tobi Bastos, MD;  [facility];  Service:  Orthopedics;  Laterality: Left;  . TOTAL KNEE ARTHROPLASTY Right 06/19/2014   Procedure: RIGHT TOTAL KNEE ARTHROPLASTY;  Surgeon: Tobi Bastos, MD;  [facility];  Service: Orthopedics;  Laterality: Right;  . TUBAL LIGATION    . VAGINAL HYSTERECTOMY     "partial"  . WRIST FLEXION TENDON TENOTOMIES AND PROXIMAL CORPECTOMY W/ WRIST ARTHRODESIS&ILIAC CREST BONE GRAFT      There were no vitals filed for this visit.   Subjective Assessment - 10/28/19 1441    Subjective  Pt arriving to therapy reporting 6/10 pain in R shoulder following R reverse shoulder replacement on 10/10/2019 with Dr. Onnie Graham.    Pertinent History  R reverse shoulder arthroplasty 10/10/2019    Patient Stated Goals  "Be able to use my arm again"    Currently in Pain?  Yes    Pain Score  6     Pain Location  Shoulder    Pain Orientation  Right    Pain Descriptors / Indicators  Aching    Pain Type  Surgical pain    Pain Onset  1 to 4 weeks ago    Pain Frequency  Intermittent    Aggravating Factors   resting    Pain Relieving Factors  movement pain can increase pain to 10/10    Effect of Pain on Daily Activities  difficulty sleeping, with ADL's         [facility] PT Assessment - 10/28/19 0001      Assessment   Medical Diagnosis  R reverse shoulder arthoplasty    Referring Provider (PT)  Dr. Justice Britain    Onset Date/Surgical Date  10/10/19    Hand Dominance  Right    Prior Therapy  yes,       Precautions   Precautions  Shoulder    Type of Shoulder Precautions  Reverse Shoulder Protocol provided by Dr. Onnie Graham    Precaution Comments  Pt arriving not wearing sling and pt was instructed to wear her sling at home and when out and about until 3-4 weeks post op or released by MD    Required Braces or Orthoses  Sling      Balance Screen   Has the patient fallen in the past 6 months  Yes    How many times?  2    Is the patient reluctant to leave their home because of a fear of falling?   No      Home Environment    Living Environment  Private residence    Living Arrangements  Alone    Type of [place] to enter    Entrance Stairs-Number of Steps  2  Entrance Stairs-Rails  None    Home Layout  Able to live on main level with bedroom/bathroom    Additional Comments  pt reports she has a basement that she needs to go to to get things out of her freezer    pt has a rail to hold onto     Prior Function   Level of Independence  Independent      Cognition   Overall Cognitive Status  Within Functional Limits for tasks assessed      Observation/Other Assessments   Focus on Therapeutic Outcomes (FOTO)   84% limitation      Posture/Postural Control   Posture/Postural Control  Postural limitations    Postural Limitations  Rounded Shoulders;Forward head;Increased thoracic kyphosis      ROM / Strength   AROM / PROM / Strength  AROM;PROM;Strength      AROM   Overall AROM   Deficits    Overall AROM Comments  R shoulder limited to PROM per protocol    AROM Assessment Site  Shoulder    Right/Left Shoulder  Left    Right Shoulder Extension  --    Right Shoulder Flexion  --    Right Shoulder Internal Rotation  --    Right Shoulder External Rotation  --    Left Shoulder Extension  34 Degrees    Left Shoulder Flexion  140 Degrees    Left Shoulder ABduction  110 Degrees    Left Shoulder Internal Rotation  --   thumb to T10   Left Shoulder External Rotation  55 Degrees      PROM   PROM Assessment Site  Shoulder    Right/Left Shoulder  Right;Left    Right Shoulder Extension  15 Degrees    Right Shoulder Flexion  75 Degrees    Right Shoulder ABduction  45 Degrees    Right Shoulder External Rotation  15 Degrees    Left Shoulder Flexion  140 Degrees    Left Shoulder ABduction  120 Degrees    Left Shoulder External Rotation  75 Degrees      Strength   Overall Strength Comments  R shoulder limited by pain and protocol    Strength Assessment Site  Shoulder    Right/Left  Shoulder  Right;Left    Left Shoulder Flexion  4/5    Left Shoulder Extension  4/5    Left Shoulder ABduction  4/5    Left Shoulder Internal Rotation  4/5    Left Shoulder External Rotation  4/5      Ambulation/Gait   Gait Comments  decreased arm swing on R UE                Objective measurements completed on examination: See above findings.                   PT Long Term Goals - 10/28/19 1607      PT LONG TERM GOAL #1   Title  Independent with a HEP.    Time  6    Period  Weeks    Status  New      PT LONG TERM GOAL #2   Title  pt will improve her R shoulder flexion to >/= 115 degrees.    Baseline  see flowsheets    Time  8    Period  Weeks    Status  New      PT LONG TERM GOAL #3   Title  Pt will report  improved sleep by 50%.    Baseline  unable to sleep through the night.    Time  6    Status  New      PT LONG TERM GOAL #4   Title  Pt will be able to report pain </= 2/10 with basic ADL's within protocol guidelines.    Baseline  6/10 at rest, unable to perform ADL's without assistance    Time  12    Period  Weeks    Status  New      PT LONG TERM GOAL #5   Title  pt will improve AROM to >/= 150 degrees flexion and ER to >/= 60 degrees.    Baseline  see flow sheet    Time  12    Period  Weeks    Status  New             Plan - 10/28/19 1551    Clinical Impression Statement  Pt arriving to therapy for evaluation of R shoulder following R reverse shoulder arthroplasty. Pt reporting 6/10  pain. Pt was educated in protocol and sling wearing. Pt also instructed in pendulum exercises and bicep curls and grip strength. Skilled PT needed to progress pt toward the goals set and her PLOF.    Personal Factors and Comorbidities  Comorbidity 3+    Comorbidities  DM, CHF, bipolar CKD, anxiety, depression, barrett esophagus, COPD, L TKA, L knee arthroscopy, cardica cath, wrist flexion tendenodesis    Examination-Activity Limitations   Carry;Dressing;Sleep;Reach Overhead;Lift    Examination-Participation Restrictions  Cleaning;Community Activity;Driving;Laundry;Other    Stability/Clinical Decision Making  Stable/Uncomplicated    Clinical Decision Making  Low    Rehab Potential  Good    PT Frequency  2x / week    PT Duration  12 weeks    PT Treatment/Interventions  ADLs/Self Care Home Management;Cryotherapy;Electrical Stimulation;Moist Heat;Ultrasound;Functional mobility training;Stair training;Therapeutic activities;Therapeutic exercise;Neuromuscular re-education;Manual techniques;Taping;Vasopneumatic Device    PT Next Visit Plan  PROM, pendulums, grip strengthening, E-stim, Vaso    PT Home Exercise Plan  see pt instructions    Consulted and Agree with Plan of Care  Patient       Patient will benefit from skilled therapeutic intervention in order to improve the following deficits and impairments:  Pain, Postural dysfunction, Impaired UE functional use, Decreased strength, Decreased range of motion, Increased edema  Visit Diagnosis: Stiffness of right shoulder, not elsewhere classified  Acute pain of right shoulder  Muscle weakness (generalized)  Localized edema     Problem List Patient Active Problem List   Diagnosis Date Noted  . Cervical disc herniation 01/31/2019  . Stomatitis 07/03/2018  . TMJ (sprain of temporomandibular joint), initial encounter 07/03/2018  . Tremor, essential 03/20/2017  . Syncope and collapse 03/20/2017  . AF (atrial fibrillation) ([place]) 08/03/2016  . Hypokalemia 02/25/2016  . Anxiety 02/24/2016  . Stroke-like symptoms 02/24/2016  . Weakness 01/26/2016  . Atrial fibrillation ([place]) 01/26/2016  . History of colon polyps 01/28/2015  . Dysphagia 01/28/2015  . History of esophageal stricture 01/28/2015  . Primary osteoarthritis of right knee 06/24/2014  . Acute systolic heart failure ([place]) 06/23/2014  . Takotsubo syndrome 06/21/2014  . Elevated troponin 06/20/2014  . Atrial  fibrillation with RVR ([place]) 06/20/2014  . Congestive dilated cardiomyopathy ([place]) 06/20/2014  . Demand ischemia of myocardium ([place]) 06/20/2014  . 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 Respiratory failure requiring intubation ([place]) 06/19/2014  . Acute pulmonary edema ([place]) 06/19/2014  . Acute respiratory failure  with hypoxia ([place]) 06/19/2014  . Hypoxemia   . Arterial hypotension   . Gait instability 10/26/2013  . Stroke ([place]) 10/26/2013  . TIA (transient ischemic attack) 10/26/2013  . Tobacco user 05/20/2013  . Postoperative anemia due to acute blood loss 01/18/2013  . Osteoarthritis of left knee 01/17/2013  . Essential hypertension 12/07/2012  . Peripheral edema 12/07/2012  . Diabetes mellitus with complication ([place]) [id number]  . COPD mixed type ([place]) 08/05/2012  . Seasonal and perennial allergic rhinitis 08/05/2012  . Urticaria 08/05/2012  . Chest pain 02/06/2012  . Hyperlipidemia 02/06/2012    Oretha Caprice, MPT 10/28/2019, 4:17 PM  [facility] [place], [place], [address] Phone: [phone]   Fax:  [phone]  Name: SHATIKA PAPROCKI MRN: [id number] Date of Birth: 1951/07/05

## 2019-10-28 NOTE — Patient Instructions (Signed)
Access Code: [email or web address] URL: [email or web address] Date: 10/28/2019 Prepared by: Kearney Hard  Exercises Flexion-Extension Shoulder Pendulum with Table Support - 2 x daily - 7 x weekly - 2 sets - 10 reps Circular Shoulder Pendulum with Table Support - 2 x daily - 7 x weekly - 2 sets - 10 reps Horizontal Shoulder Pendulum with Table Support - 2 x daily - 7 x weekly - 2 sets - 10 reps Seated Gripping Towel - 2 x daily - 7 x weekly - 2 sets - 10 reps

## 2019-10-31 ENCOUNTER — Ambulatory Visit: Payer: Medicare Other | Attending: Orthopedic Surgery | Admitting: Physical Therapy

## 2019-10-31 ENCOUNTER — Encounter: Payer: Self-pay | Admitting: Physical Therapy

## 2019-10-31 ENCOUNTER — Other Ambulatory Visit: Payer: Self-pay

## 2019-10-31 DIAGNOSIS — M25611 Stiffness of right shoulder, not elsewhere classified: Secondary | ICD-10-CM | POA: Insufficient documentation

## 2019-10-31 DIAGNOSIS — R600 Localized edema: Secondary | ICD-10-CM | POA: Diagnosis present

## 2019-10-31 DIAGNOSIS — M6281 Muscle weakness (generalized): Secondary | ICD-10-CM | POA: Diagnosis present

## 2019-10-31 DIAGNOSIS — M25511 Pain in right shoulder: Secondary | ICD-10-CM

## 2019-10-31 NOTE — Therapy (Signed)
[facility] [place], [place], [address] Phone: [phone]   Fax:  [phone]  Physical Therapy Treatment  Patient Details  Name: [name] [name] MRN: [id number] Date of Birth: 1951-02-06 Referring Provider (PT): Dr. Justice Britain   Encounter Date: 10/31/2019  PT End of Session - 10/31/19 1545    Visit Number  2    Number of Visits  24    Date for PT Re-Evaluation  12/27/19    PT Start Time  0317    PT Stop Time  0354    PT Time Calculation (min)  37 min    Activity Tolerance  Patient tolerated treatment well    Behavior During Therapy  [facility] for tasks assessed/performed       Past Medical History:  Diagnosis Date  . Anemia    hx of years ago   . Anxiety   . Aortic valve disorders   . Arthritis   . Asthma   . Barrett esophagus   . Bipolar disorder ([place])   . CHF (congestive heart failure) ([place])   . Chronic bronchitis ([place])   . Chronic kidney disease    "I don't have but 1" (08/03/2016)  . Complication of anesthesia   . COPD (chronic obstructive pulmonary disease) ([place])    "just have a touch" (08/03/2016)  . Depression   . Esophageal reflux   . Esophageal stricture   . Family history of adverse reaction to anesthesia    brother had nausea and vomiting, mother has had nausea  . Fibromyalgia    "hands, knees, elbows; back; think it's in my hips" (08/03/2016)  . GAD (generalized anxiety disorder)   . GERD (gastroesophageal reflux disease)   . Hiatal hernia   . History of blood transfusion 1970s; 01/08/2013   "low blood count"  . History of kidney stones 01-08-13   past hx.  . History of nuclear stress test 01/2016   low risk study  . History of stomach ulcers    "some have bleed"  . Hyperlipemia   . Hypertension 12/07/2012  . Insomnia, unspecified   . Myocardial infarction [facility]) 01-08-13   '06-Chest pain-no stent-dx. MI-stress related  . Osteoporosis   . Personal history of colonic polyps 09/2010   TUBULAR  ADENOMAS (X3); NEGATIVE FOR HIGH GRADE DYSPLASIA OR MALIGNANCY.  [name] Pneumonia    "several times" (08/03/2016)  . PONV (postoperative nausea and vomiting)   . Stroke [facility])    "2 or 3"; remains with some right sided weaknes (08/03/2016)  . Takotsubo syndrome   . Tremor, essential 03/20/2017  . Type II diabetes mellitus ([place])   . Urinary frequency    AND INCONTINENCE    Past Surgical History:  Procedure Laterality Date  . ANTERIOR CERVICAL DECOMP/DISCECTOMY FUSION N/A 01/31/2019   Procedure: ANTERIOR CERVICAL DECOMPRESSION/DISCECTOMY FUSION C5-6;  Surgeon: Melina Schools, MD;  Location: [place];  Service: Orthopedics;  Laterality: N/A;  3 hrs  . ATRIAL FIBRILLATION ABLATION  08/03/2016  . CARDIAC CATHETERIZATION     normal per Dr Acie Fredrickson in note dated 02/06/12   . CARDIAC CATHETERIZATION     "I've had 2 or 3" (08/03/2016)  . CARPAL TUNNEL RELEASE Right   . CATARACT EXTRACTION W/ INTRAOCULAR LENS  IMPLANT, BILATERAL    . COLONOSCOPY W/ BIOPSIES AND POLYPECTOMY    . DILATION AND CURETTAGE OF UTERUS     S/P miscarriage  . ELECTROPHYSIOLOGIC STUDY N/A 08/03/2016   Procedure: Atrial Fibrillation Ablation;  Surgeon: [employer],  MD;  Location: [place] [facility];  Service: Cardiovascular;  Laterality: N/A;  . ESOPHAGEAL DILATION  01-08-13   2 yrs ago  . HEEL SPUR SURGERY Left 2005  . KNEE ARTHROSCOPY  02/28/2012   Procedure: ARTHROSCOPY KNEE;  Surgeon: Tobi Bastos, MD;  [facility];  Service: Orthopedics;  Laterality: Left;  . LAPAROSCOPIC CHOLECYSTECTOMY  03/2002  . REVERSE SHOULDER ARTHROPLASTY Right 10/10/2019   Procedure: REVERSE SHOULDER ARTHROPLASTY;  Surgeon: Justice Britain, MD;  [facility];  Service: Orthopedics;  Laterality: Right;  183min  . SHOULDER OPEN ROTATOR CUFF REPAIR Right 2011  . TONSILLECTOMY AND ADENOIDECTOMY  1965  . TOTAL KNEE ARTHROPLASTY Left 01/17/2013   Procedure: LEFT TOTAL KNEE ARTHROPLASTY;  Surgeon: Tobi Bastos, MD;  [facility];  Service:  Orthopedics;  Laterality: Left;  . TOTAL KNEE ARTHROPLASTY Right 06/19/2014   Procedure: RIGHT TOTAL KNEE ARTHROPLASTY;  Surgeon: Tobi Bastos, MD;  [facility];  Service: Orthopedics;  Laterality: Right;  . TUBAL LIGATION    . VAGINAL HYSTERECTOMY     "partial"  . WRIST FLEXION TENDON TENOTOMIES AND PROXIMAL CORPECTOMY W/ WRIST ARTHRODESIS&ILIAC CREST BONE GRAFT      There were no vitals filed for this visit.  Subjective Assessment - 10/31/19 1527    Subjective  COVID -19 screening performed upon arrival. Increased pain upon arrival    Pertinent History  R reverse shoulder arthroplasty 10/10/2019    Patient Stated Goals  "Be able to use my arm again"    Currently in Pain?  Yes    Pain Score  9     Pain Location  Shoulder    Pain Orientation  Right    Pain Descriptors / Indicators  Aching    Pain Onset  1 to 4 weeks ago    Pain Frequency  Intermittent    Aggravating Factors   movement    Pain Relieving Factors  rest                       [facility] Adult PT Treatment/Exercise - 10/31/19 0001      Modalities   Modalities  Electrical Stimulation;Vasopneumatic      Electrical Stimulation   Electrical Stimulation Location  right shoulder    Electrical Stimulation Action  premod non motoric    Electrical Stimulation Parameters  1-10hz  x21min    Electrical Stimulation Goals  Pain      Vasopneumatic   Number Minutes Vasopneumatic   10 minutes    Vasopnuematic Location   Shoulder   right   Vasopneumatic Pressure  Low    Vasopneumatic Temperature   34 for edema      Manual Therapy   Manual Therapy  Passive ROM    Passive ROM  PROM for right shoulder flexion and ER with gentle movements within protocol range                  PT Long Term Goals - 10/28/19 1607      PT LONG TERM GOAL #1   Title  Independent with a HEP.    Time  6    Period  Weeks    Status  New      PT LONG TERM GOAL #2   Title  pt will improve her R shoulder flexion to >/=  115 degrees.    Baseline  see flowsheets    Time  8    Period  Weeks    Status  New      PT LONG TERM GOAL #3   Title  Pt will report improved sleep by 50%.    Baseline  unable to sleep through the night.    Time  6    Status  New      PT LONG TERM GOAL #4   Title  Pt will be able to report pain </= 2/10 with basic ADL's within protocol guidelines.    Baseline  6/10 at rest, unable to perform ADL's without assistance    Time  12    Period  Weeks    Status  New      PT LONG TERM GOAL #5   Title  pt will improve AROM to >/= 150 degrees flexion and ER to >/= 60 degrees.    Baseline  see flow sheet    Time  12    Period  Weeks    Status  New            Plan - 10/31/19 1549    Clinical Impression Statement  Patient tolerated treatment fair due to increased pain. Patientreported doing pendullum exercises and wearing her sling as she should. Patient has reported tring to lift arm. Today educated patient on protocol and healing to avoid re-injury. Current goals ongoing. Normal response upon removal of modalities.    Personal Factors and Comorbidities  Comorbidity 3+    Comorbidities  DM, CHF, bipolar CKD, anxiety, depression, barrett esophagus, COPD, L TKA, L knee arthroscopy, cardica cath, wrist flexion tendenodesis    Examination-Activity Limitations  Carry;Dressing;Sleep;Reach Overhead;Lift    Examination-Participation Restrictions  Cleaning;Community Activity;Driving;Laundry;Other    Stability/Clinical Decision Making  Stable/Uncomplicated    Rehab Potential  Good    PT Frequency  2x / week    PT Duration  12 weeks    PT Treatment/Interventions  ADLs/Self Care Home Management;Cryotherapy;Electrical Stimulation;Moist Heat;Ultrasound;Functional mobility training;Stair training;Therapeutic activities;Therapeutic exercise;Neuromuscular re-education;Manual techniques;Taping;Vasopneumatic Device    PT Next Visit Plan  cont with POC for PROM, pendulums, grip strengthening, E-stim,  Vaso    Consulted and Agree with Plan of Care  Patient       Patient will benefit from skilled therapeutic intervention in order to improve the following deficits and impairments:  Pain, Postural dysfunction, Impaired UE functional use, Decreased strength, Decreased range of motion, Increased edema  Visit Diagnosis: Stiffness of right shoulder, not elsewhere classified  Acute pain of right shoulder  Muscle weakness (generalized)  Localized edema     Problem List Patient Active Problem List   Diagnosis Date Noted  . Cervical disc herniation 01/31/2019  . Stomatitis 07/03/2018  . TMJ (sprain of temporomandibular joint), initial encounter 07/03/2018  . Tremor, essential 03/20/2017  . Syncope and collapse 03/20/2017  . AF (atrial fibrillation) ([place]) 08/03/2016  . Hypokalemia 02/25/2016  . Anxiety 02/24/2016  . Stroke-like symptoms 02/24/2016  . Weakness 01/26/2016  . Atrial fibrillation ([place]) 01/26/2016  . History of colon polyps 01/28/2015  . Dysphagia 01/28/2015  . History of esophageal stricture 01/28/2015  . Primary osteoarthritis of right knee 06/24/2014  . Acute systolic heart failure ([place]) 06/23/2014  . Takotsubo syndrome 06/21/2014  . Elevated troponin 06/20/2014  . Atrial fibrillation with RVR ([place]) 06/20/2014  . Congestive dilated cardiomyopathy ([place]) 06/20/2014  . Demand ischemia of myocardium ([place]) 06/20/2014  . 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 Respiratory failure requiring intubation ([place]) 06/19/2014  . Acute pulmonary edema ([place]) 06/19/2014  . Acute respiratory failure with hypoxia ([place]) 06/19/2014  .  Hypoxemia   . Arterial hypotension   . Gait instability 10/26/2013  . Stroke ([place]) 10/26/2013  . TIA (transient ischemic attack) 10/26/2013  . Tobacco user 05/20/2013  . Postoperative anemia due to acute blood loss 01/18/2013  . Osteoarthritis of left knee 01/17/2013  . Essential hypertension 12/07/2012  . Peripheral edema  12/07/2012  . Diabetes mellitus with complication ([place]) [id number]  . COPD mixed type ([place]) 08/05/2012  . Seasonal and perennial allergic rhinitis 08/05/2012  . Urticaria 08/05/2012  . Chest pain 02/06/2012  . Hyperlipidemia 02/06/2012    Shaneil Yazdi P, PTA 10/31/2019, 3:55 PM  [facility] [place], [place], [address] Phone: [phone]   Fax:  [phone]  Name: INAYAT NEAVE MRN: [id number] Date of Birth: Feb 27, 1951

## 2019-11-05 ENCOUNTER — Ambulatory Visit: Payer: Medicare Other | Admitting: Physical Therapy

## 2019-11-05 ENCOUNTER — Other Ambulatory Visit: Payer: Self-pay

## 2019-11-05 DIAGNOSIS — M25511 Pain in right shoulder: Secondary | ICD-10-CM

## 2019-11-05 DIAGNOSIS — M25611 Stiffness of right shoulder, not elsewhere classified: Secondary | ICD-10-CM | POA: Diagnosis not present

## 2019-11-05 NOTE — Therapy (Signed)
[facility] [place], [place], [address] Phone: [phone]   Fax:  [phone]  Physical Therapy Treatment  Patient Details  Name: [name] [name] MRN: [id number] Date of Birth: Mar 23, 1951 Referring Provider (PT): Dr. Justice Britain   Encounter Date: 11/05/2019  PT End of Session - 11/05/19 1721    Visit Number  3    Number of Visits  24    Date for PT Re-Evaluation  12/27/19    PT Start Time  0400    PT Stop Time  0453    PT Time Calculation (min)  53 min    Activity Tolerance  Patient tolerated treatment well    Behavior During Therapy  [facility] for tasks assessed/performed       Past Medical History:  Diagnosis Date  . Anemia    hx of years ago   . Anxiety   . Aortic valve disorders   . Arthritis   . Asthma   . Barrett esophagus   . Bipolar disorder ([place])   . CHF (congestive heart failure) ([place])   . Chronic bronchitis ([place])   . Chronic kidney disease    "I don't have but 1" (08/03/2016)  . Complication of anesthesia   . COPD (chronic obstructive pulmonary disease) ([place])    "just have a touch" (08/03/2016)  . Depression   . Esophageal reflux   . Esophageal stricture   . Family history of adverse reaction to anesthesia    brother had nausea and vomiting, mother has had nausea  . Fibromyalgia    "hands, knees, elbows; back; think it's in my hips" (08/03/2016)  . GAD (generalized anxiety disorder)   . GERD (gastroesophageal reflux disease)   . Hiatal hernia   . History of blood transfusion 1970s; 01/08/2013   "low blood count"  . History of kidney stones 01-08-13   past hx.  . History of nuclear stress test 01/2016   low risk study  . History of stomach ulcers    "some have bleed"  . Hyperlipemia   . Hypertension 12/07/2012  . Insomnia, unspecified   . Myocardial infarction [facility]) 01-08-13   '06-Chest pain-no stent-dx. MI-stress related  . Osteoporosis   . Personal history of colonic polyps 09/2010   TUBULAR  ADENOMAS (X3); NEGATIVE FOR HIGH GRADE DYSPLASIA OR MALIGNANCY.  [name] Pneumonia    "several times" (08/03/2016)  . PONV (postoperative nausea and vomiting)   . Stroke [facility])    "2 or 3"; remains with some right sided weaknes (08/03/2016)  . Takotsubo syndrome   . Tremor, essential 03/20/2017  . Type II diabetes mellitus ([place])   . Urinary frequency    AND INCONTINENCE    Past Surgical History:  Procedure Laterality Date  . ANTERIOR CERVICAL DECOMP/DISCECTOMY FUSION N/A 01/31/2019   Procedure: ANTERIOR CERVICAL DECOMPRESSION/DISCECTOMY FUSION C5-6;  Surgeon: Melina Schools, MD;  Location: [place];  Service: Orthopedics;  Laterality: N/A;  3 hrs  . ATRIAL FIBRILLATION ABLATION  08/03/2016  . CARDIAC CATHETERIZATION     normal per Dr Acie Fredrickson in note dated 02/06/12   . CARDIAC CATHETERIZATION     "I've had 2 or 3" (08/03/2016)  . CARPAL TUNNEL RELEASE Right   . CATARACT EXTRACTION W/ INTRAOCULAR LENS  IMPLANT, BILATERAL    . COLONOSCOPY W/ BIOPSIES AND POLYPECTOMY    . DILATION AND CURETTAGE OF UTERUS     S/P miscarriage  . ELECTROPHYSIOLOGIC STUDY N/A 08/03/2016   Procedure: Atrial Fibrillation Ablation;  Surgeon: [employer],  MD;  Location: [place] [facility];  Service: Cardiovascular;  Laterality: N/A;  . ESOPHAGEAL DILATION  01-08-13   2 yrs ago  . HEEL SPUR SURGERY Left 2005  . KNEE ARTHROSCOPY  02/28/2012   Procedure: ARTHROSCOPY KNEE;  Surgeon: Tobi Bastos, MD;  [facility];  Service: Orthopedics;  Laterality: Left;  . LAPAROSCOPIC CHOLECYSTECTOMY  03/2002  . REVERSE SHOULDER ARTHROPLASTY Right 10/10/2019   Procedure: REVERSE SHOULDER ARTHROPLASTY;  Surgeon: Justice Britain, MD;  Location: [facility];  Service: Orthopedics;  Laterality: Right;  117min  . SHOULDER OPEN ROTATOR CUFF REPAIR Right 2011  . TONSILLECTOMY AND ADENOIDECTOMY  1965  . TOTAL KNEE ARTHROPLASTY Left 01/17/2013   Procedure: LEFT TOTAL KNEE ARTHROPLASTY;  Surgeon: Tobi Bastos, MD;  [facility];  Service:  Orthopedics;  Laterality: Left;  . TOTAL KNEE ARTHROPLASTY Right 06/19/2014   Procedure: RIGHT TOTAL KNEE ARTHROPLASTY;  Surgeon: Tobi Bastos, MD;  [facility];  Service: Orthopedics;  Laterality: Right;  . TUBAL LIGATION    . VAGINAL HYSTERECTOMY     "partial"  . WRIST FLEXION TENDON TENOTOMIES AND PROXIMAL CORPECTOMY W/ WRIST ARTHRODESIS&ILIAC CREST BONE GRAFT      There were no vitals filed for this visit.                    [facility] Adult PT Treatment/Exercise - 11/05/19 0001      Vasopneumatic   Number Minutes Vasopneumatic   15 minutes    Vasopnuematic Location   --   RT shld with pillow btw thorax and elbow.   Vasopneumatic Pressure  Low      Manual Therapy   Manual Therapy  Passive ROM    Passive ROM  In supine:  PROM per protocol guidelines x 24 minutes                   PT Long Term Goals - 10/28/19 1607      PT LONG TERM GOAL #1   Title  Independent with a HEP.    Time  6    Period  Weeks    Status  New      PT LONG TERM GOAL #2   Title  pt will improve her R shoulder flexion to >/= 115 degrees.    Baseline  see flowsheets    Time  8    Period  Weeks    Status  New      PT LONG TERM GOAL #3   Title  Pt will report improved sleep by 50%.    Baseline  unable to sleep through the night.    Time  6    Status  New      PT LONG TERM GOAL #4   Title  Pt will be able to report pain </= 2/10 with basic ADL's within protocol guidelines.    Baseline  6/10 at rest, unable to perform ADL's without assistance    Time  12    Period  Weeks    Status  New      PT LONG TERM GOAL #5   Title  pt will improve AROM to >/= 150 degrees flexion and ER to >/= 60 degrees.    Baseline  see flow sheet    Time  12    Period  Weeks    Status  New            Plan - 11/05/19 1726    Clinical Impression  Statement  The patient did very well today.  Right shoulder range of motion performed within protocol guidelines.  Very little muscle  guarding noted.    Personal Factors and Comorbidities  Comorbidity 3+    Comorbidities  DM, CHF, bipolar CKD, anxiety, depression, barrett esophagus, COPD, L TKA, L knee arthroscopy, cardica cath, wrist flexion tendenodesis    Examination-Activity Limitations  Carry;Dressing;Sleep;Reach Overhead;Lift    Examination-Participation Restrictions  Cleaning;Community Activity;Driving;Laundry;Other    Stability/Clinical Decision Making  Stable/Uncomplicated    Rehab Potential  Good    PT Frequency  2x / week    PT Duration  12 weeks    PT Treatment/Interventions  ADLs/Self Care Home Management;Cryotherapy;Electrical Stimulation;Moist Heat;Ultrasound;Functional mobility training;Stair training;Therapeutic activities;Therapeutic exercise;Neuromuscular re-education;Manual techniques;Taping;Vasopneumatic Device    PT Next Visit Plan  cont with POC for PROM, pendulums, grip strengthening, E-stim, Vaso    PT Home Exercise Plan  see pt instructions       Patient will benefit from skilled therapeutic intervention in order to improve the following deficits and impairments:  Pain, Postural dysfunction, Impaired UE functional use, Decreased strength, Decreased range of motion, Increased edema  Visit Diagnosis: Stiffness of right shoulder, not elsewhere classified  Acute pain of right shoulder     Problem List Patient Active Problem List   Diagnosis Date Noted  . Cervical disc herniation 01/31/2019  . Stomatitis 07/03/2018  . TMJ (sprain of temporomandibular joint), initial encounter 07/03/2018  . Tremor, essential 03/20/2017  . Syncope and collapse 03/20/2017  . AF (atrial fibrillation) ([place]) 08/03/2016  . Hypokalemia 02/25/2016  . Anxiety 02/24/2016  . Stroke-like symptoms 02/24/2016  . Weakness 01/26/2016  . Atrial fibrillation ([place]) 01/26/2016  . History of colon polyps 01/28/2015  . Dysphagia 01/28/2015  . History of esophageal stricture 01/28/2015  . Primary osteoarthritis of right knee  06/24/2014  . Acute systolic heart failure ([place]) 06/23/2014  . Takotsubo syndrome 06/21/2014  . Elevated troponin 06/20/2014  . Atrial fibrillation with RVR ([place]) 06/20/2014  . Congestive dilated cardiomyopathy ([place]) 06/20/2014  . Demand ischemia of myocardium ([place]) 06/20/2014  . 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 Respiratory failure requiring intubation ([place]) 06/19/2014  . Acute pulmonary edema ([place]) 06/19/2014  . Acute respiratory failure with hypoxia ([place]) 06/19/2014  . Hypoxemia   . Arterial hypotension   . Gait instability 10/26/2013  . Stroke ([place]) 10/26/2013  . TIA (transient ischemic attack) 10/26/2013  . Tobacco user 05/20/2013  . Postoperative anemia due to acute blood loss 01/18/2013  . Osteoarthritis of left knee 01/17/2013  . Essential hypertension 12/07/2012  . Peripheral edema 12/07/2012  . Diabetes mellitus with complication ([place]) Q000111Q  . COPD mixed type ([place]) 08/05/2012  . Seasonal and perennial allergic rhinitis 08/05/2012  . Urticaria 08/05/2012  . Chest pain 02/06/2012  . Hyperlipidemia 02/06/2012    Ceaser Ebeling, [place] MPT 11/05/2019, 5:30 PM  [facility] [address] [place], [place], [address] Phone: [phone]   Fax:  [phone]  Name: [name] [name] MRN: [id number] Date of Birth: 14-Oct-1950

## 2019-11-07 ENCOUNTER — Other Ambulatory Visit: Payer: Self-pay

## 2019-11-07 ENCOUNTER — Ambulatory Visit: Payer: Medicare Other | Admitting: Physical Therapy

## 2019-11-07 DIAGNOSIS — M25611 Stiffness of right shoulder, not elsewhere classified: Secondary | ICD-10-CM

## 2019-11-07 DIAGNOSIS — M25511 Pain in right shoulder: Secondary | ICD-10-CM

## 2019-11-07 NOTE — Therapy (Signed)
[facility] [place], [place], [address] Phone: [phone]   Fax:  [phone]  Physical Therapy Treatment  Patient Details  Name: [name] [name] MRN: [id number] Date of Birth: [birth year] Referring Provider (PT): Dr. Justice Britain   Encounter Date: 11/07/2019  PT End of Session - 11/07/19 1638    Visit Number  4    Number of Visits  24    Date for PT Re-Evaluation  12/27/19    PT Start Time  0400    PT Stop Time  0455    PT Time Calculation (min)  55 min    Activity Tolerance  Patient tolerated treatment well    Behavior During Therapy  [facility] for tasks assessed/performed       Past Medical History:  Diagnosis Date  . Anemia    hx of years ago   . Anxiety   . Aortic valve disorders   . Arthritis   . Asthma   . Barrett esophagus   . Bipolar disorder ([place])   . CHF (congestive heart failure) ([place])   . Chronic bronchitis ([place])   . Chronic kidney disease    "I don't have but 1" (08/03/2016)  . Complication of anesthesia   . COPD (chronic obstructive pulmonary disease) ([place])    "just have a touch" (08/03/2016)  . Depression   . Esophageal reflux   . Esophageal stricture   . Family history of adverse reaction to anesthesia    brother had nausea and vomiting, mother has had nausea  . Fibromyalgia    "hands, knees, elbows; back; think it's in my hips" (08/03/2016)  . GAD (generalized anxiety disorder)   . GERD (gastroesophageal reflux disease)   . Hiatal hernia   . History of blood transfusion 1970s; 01/08/2013   "low blood count"  . History of kidney stones 01-08-13   past hx.  . History of nuclear stress test 01/2016   low risk study  . History of stomach ulcers    "some have bleed"  . Hyperlipemia   . Hypertension 12/07/2012  . Insomnia, unspecified   . Myocardial infarction [facility]) 01-08-13   '06-Chest pain-no stent-dx. MI-stress related  . Osteoporosis   . Personal history of colonic polyps 09/2010   TUBULAR  ADENOMAS (X3); NEGATIVE FOR HIGH GRADE DYSPLASIA OR MALIGNANCY.  [name] Pneumonia    "several times" (08/03/2016)  . PONV (postoperative nausea and vomiting)   . Stroke [facility])    "2 or 3"; remains with some right sided weaknes (08/03/2016)  . Takotsubo syndrome   . Tremor, essential 03/20/2017  . Type II diabetes mellitus ([place])   . Urinary frequency    AND INCONTINENCE    Past Surgical History:  Procedure Laterality Date  . ANTERIOR CERVICAL DECOMP/DISCECTOMY FUSION N/A 01/31/2019   Procedure: ANTERIOR CERVICAL DECOMPRESSION/DISCECTOMY FUSION C5-6;  Surgeon: Melina Schools, MD;  Location: [place];  Service: Orthopedics;  Laterality: N/A;  3 hrs  . ATRIAL FIBRILLATION ABLATION  08/03/2016  . CARDIAC CATHETERIZATION     normal per Dr Acie Fredrickson in note dated 02/06/12   . CARDIAC CATHETERIZATION     "I've had 2 or 3" (08/03/2016)  . CARPAL TUNNEL RELEASE Right   . CATARACT EXTRACTION W/ INTRAOCULAR LENS  IMPLANT, BILATERAL    . COLONOSCOPY W/ BIOPSIES AND POLYPECTOMY    . DILATION AND CURETTAGE OF UTERUS     S/P miscarriage  . ELECTROPHYSIOLOGIC STUDY N/A 08/03/2016   Procedure: Atrial Fibrillation Ablation;  Surgeon: [employer],  MD;  Location: [facility];  Service: Cardiovascular;  Laterality: N/A;  . ESOPHAGEAL DILATION  01-08-13   2 yrs ago  . HEEL SPUR SURGERY Left 2005  . KNEE ARTHROSCOPY  02/28/2012   Procedure: ARTHROSCOPY KNEE;  Surgeon: Tobi Bastos, MD;  [facility];  Service: Orthopedics;  Laterality: Left;  . LAPAROSCOPIC CHOLECYSTECTOMY  03/2002  . REVERSE SHOULDER ARTHROPLASTY Right 10/10/2019   Procedure: REVERSE SHOULDER ARTHROPLASTY;  Surgeon: Justice Britain, MD;  [facility];  Service: Orthopedics;  Laterality: Right;  148min  . SHOULDER OPEN ROTATOR CUFF REPAIR Right 2011  . TONSILLECTOMY AND ADENOIDECTOMY  1965  . TOTAL KNEE ARTHROPLASTY Left 01/17/2013   Procedure: LEFT TOTAL KNEE ARTHROPLASTY;  Surgeon: Tobi Bastos, MD;  [facility];  Service:  Orthopedics;  Laterality: Left;  . TOTAL KNEE ARTHROPLASTY Right 06/19/2014   Procedure: RIGHT TOTAL KNEE ARTHROPLASTY;  Surgeon: Tobi Bastos, MD;  [facility];  Service: Orthopedics;  Laterality: Right;  . TUBAL LIGATION    . VAGINAL HYSTERECTOMY     "partial"  . WRIST FLEXION TENDON TENOTOMIES AND PROXIMAL CORPECTOMY W/ WRIST ARTHRODESIS&ILIAC CREST BONE GRAFT      There were no vitals filed for this visit.  Subjective Assessment - 11/07/19 1640    Subjective  COVID-19 screen performed prior to patient entering clinic.  Doing better.    Pertinent History  R reverse shoulder arthroplasty 10/10/2019    Patient Stated Goals  "Be able to use my arm again"    Currently in Pain?  Yes    Pain Score  2     Pain Location  Shoulder    Pain Descriptors / Indicators  Aching;Dull    Pain Type  Surgical pain    Pain Onset  1 to 4 weeks ago                       [facility] Adult PT Treatment/Exercise - 11/07/19 0001      Vasopneumatic   Number Minutes Vasopneumatic   20 minutes    Vasopnuematic Location   --   RT shoulder with pillow btw thorax and elbow.   Vasopneumatic Pressure  Low      Manual Therapy   Manual Therapy  Passive ROM    Passive ROM  In supine:  PROM per protocol guidelines x 24 minutes to patient's right shoulder.                  PT Long Term Goals - 10/28/19 1607      PT LONG TERM GOAL #1   Title  Independent with a HEP.    Time  6    Period  Weeks    Status  New      PT LONG TERM GOAL #2   Title  pt will improve her R shoulder flexion to >/= 115 degrees.    Baseline  see flowsheets    Time  8    Period  Weeks    Status  New      PT LONG TERM GOAL #3   Title  Pt will report improved sleep by 50%.    Baseline  unable to sleep through the night.    Time  6    Status  New      PT LONG TERM GOAL #4   Title  Pt will be able to report pain </= 2/10 with basic ADL's within protocol guidelines.  Baseline  6/10 at rest, unable  to perform ADL's without assistance    Time  12    Period  Weeks    Status  New      PT LONG TERM GOAL #5   Title  pt will improve AROM to >/= 150 degrees flexion and ER to >/= 60 degrees.    Baseline  see flow sheet    Time  12    Period  Weeks    Status  New            Plan - 11/07/19 1649    Clinical Impression Statement  Patient continuing to do very well.  Her pain has come down a greta deal with a low 2/10.  Right shoulder motion improving nicely per protocol.    Personal Factors and Comorbidities  Comorbidity 3+    Comorbidities  DM, CHF, bipolar CKD, anxiety, depression, barrett esophagus, COPD, L TKA, L knee arthroscopy, cardica cath, wrist flexion tendenodesis    Examination-Activity Limitations  Carry;Dressing;Sleep;Reach Overhead;Lift    Stability/Clinical Decision Making  Stable/Uncomplicated    Rehab Potential  Good    PT Frequency  2x / week    PT Duration  12 weeks    PT Treatment/Interventions  ADLs/Self Care Home Management;Cryotherapy;Electrical Stimulation;Moist Heat;Ultrasound;Functional mobility training;Stair training;Therapeutic activities;Therapeutic exercise;Neuromuscular re-education;Manual techniques;Taping;Vasopneumatic Device    PT Next Visit Plan  cont with POC for PROM, pendulums, grip strengthening, E-stim, Vaso    PT Home Exercise Plan  see pt instructions    Consulted and Agree with Plan of Care  Patient       Patient will benefit from skilled therapeutic intervention in order to improve the following deficits and impairments:  Pain, Postural dysfunction, Impaired UE functional use, Decreased strength, Decreased range of motion, Increased edema  Visit Diagnosis: Stiffness of right shoulder, not elsewhere classified  Acute pain of right shoulder     Problem List Patient Active Problem List   Diagnosis Date Noted  . Cervical disc herniation 01/31/2019  . Stomatitis 07/03/2018  . TMJ (sprain of temporomandibular joint), initial  encounter 07/03/2018  . Tremor, essential 03/20/2017  . Syncope and collapse 03/20/2017  . AF (atrial fibrillation) ([place]) 08/03/2016  . Hypokalemia 02/25/2016  . Anxiety 02/24/2016  . Stroke-like symptoms 02/24/2016  . Weakness 01/26/2016  . Atrial fibrillation ([place]) 01/26/2016  . History of colon polyps 01/28/2015  . Dysphagia 01/28/2015  . History of esophageal stricture 01/28/2015  . Primary osteoarthritis of right knee 06/24/2014  . Acute systolic heart failure ([place]) 06/23/2014  . Takotsubo syndrome 06/21/2014  . Elevated troponin 06/20/2014  . Atrial fibrillation with RVR ([place]) 06/20/2014  . Congestive dilated cardiomyopathy ([place]) 06/20/2014  . Demand ischemia of myocardium ([place]) 06/20/2014  . 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 Respiratory failure requiring intubation ([place]) 06/19/2014  . Acute pulmonary edema ([place]) 06/19/2014  . Acute respiratory failure with hypoxia ([place]) 06/19/2014  . Hypoxemia   . Arterial hypotension   . Gait instability 10/26/2013  . Stroke ([place]) 10/26/2013  . TIA (transient ischemic attack) 10/26/2013  . Tobacco user 05/20/2013  . Postoperative anemia due to acute blood loss 01/18/2013  . Osteoarthritis of left knee 01/17/2013  . Essential hypertension 12/07/2012  . Peripheral edema 12/07/2012  . Diabetes mellitus with complication ([place]) Q000111Q  . COPD mixed type ([place]) 08/05/2012  . Seasonal and perennial allergic rhinitis 08/05/2012  . Urticaria 08/05/2012  . Chest pain 02/06/2012  . Hyperlipidemia 02/06/2012    Luwanda Starr, [place] MPT  11/07/2019, 4:56 PM  [facility] [place], [place], [address] Phone: [phone]   Fax:  [phone]  Name: [name] [name] MRN: [id number] Date of Birth: 1951-03-20

## 2019-11-12 ENCOUNTER — Ambulatory Visit: Payer: Medicare Other | Admitting: Physical Therapy

## 2019-11-12 ENCOUNTER — Other Ambulatory Visit: Payer: Self-pay

## 2019-11-12 DIAGNOSIS — M25611 Stiffness of right shoulder, not elsewhere classified: Secondary | ICD-10-CM

## 2019-11-12 DIAGNOSIS — M25511 Pain in right shoulder: Secondary | ICD-10-CM

## 2019-11-12 NOTE — Therapy (Addendum)
[facility] [place], [place], [address] Phone: [phone]   Fax:  [phone]  Physical Therapy Treatment  Patient Details  Name: [name] [name] MRN: [id number] Date of Birth: [birth year] Referring Provider (PT): Dr. Justice Britain   Encounter Date: 11/12/2019  PT End of Session - 11/12/19 1607    Visit Number  5    Number of Visits  24    Date for PT Re-Evaluation  12/27/19    PT Start Time  0333    PT Stop Time  0417    PT Time Calculation (min)  44 min    Activity Tolerance  Patient tolerated treatment well    Behavior During Therapy  [facility] for tasks assessed/performed       Past Medical History:  Diagnosis Date  . Anemia    hx of years ago   . Anxiety   . Aortic valve disorders   . Arthritis   . Asthma   . Barrett esophagus   . Bipolar disorder ([place])   . CHF (congestive heart failure) ([place])   . Chronic bronchitis ([place])   . Chronic kidney disease    "I don't have but 1" (08/03/2016)  . Complication of anesthesia   . COPD (chronic obstructive pulmonary disease) ([place])    "just have a touch" (08/03/2016)  . Depression   . Esophageal reflux   . Esophageal stricture   . Family history of adverse reaction to anesthesia    brother had nausea and vomiting, mother has had nausea  . Fibromyalgia    "hands, knees, elbows; back; think it's in my hips" (08/03/2016)  . GAD (generalized anxiety disorder)   . GERD (gastroesophageal reflux disease)   . Hiatal hernia   . History of blood transfusion 1970s; 01/08/2013   "low blood count"  . History of kidney stones 01-08-13   past hx.  . History of nuclear stress test 01/2016   low risk study  . History of stomach ulcers    "some have bleed"  . Hyperlipemia   . Hypertension 12/07/2012  . Insomnia, unspecified   . Myocardial infarction [facility]) 01-08-13   '06-Chest pain-no stent-dx. MI-stress related  . Osteoporosis   . Personal history of colonic polyps 09/2010   TUBULAR  ADENOMAS (X3); NEGATIVE FOR HIGH GRADE DYSPLASIA OR MALIGNANCY.  [name] Pneumonia    "several times" (08/03/2016)  . PONV (postoperative nausea and vomiting)   . Stroke [facility])    "2 or 3"; remains with some right sided weaknes (08/03/2016)  . Takotsubo syndrome   . Tremor, essential 03/20/2017  . Type II diabetes mellitus ([place])   . Urinary frequency    AND INCONTINENCE    Past Surgical History:  Procedure Laterality Date  . ANTERIOR CERVICAL DECOMP/DISCECTOMY FUSION N/A 01/31/2019   Procedure: ANTERIOR CERVICAL DECOMPRESSION/DISCECTOMY FUSION C5-6;  Surgeon: Melina Schools, MD;  Location: [place];  Service: Orthopedics;  Laterality: N/A;  3 hrs  . ATRIAL FIBRILLATION ABLATION  08/03/2016  . CARDIAC CATHETERIZATION     normal per Dr Acie Fredrickson in note dated 02/06/12   . CARDIAC CATHETERIZATION     "I've had 2 or 3" (08/03/2016)  . CARPAL TUNNEL RELEASE Right   . CATARACT EXTRACTION W/ INTRAOCULAR LENS  IMPLANT, BILATERAL    . COLONOSCOPY W/ BIOPSIES AND POLYPECTOMY    . DILATION AND CURETTAGE OF UTERUS     S/P miscarriage  . ELECTROPHYSIOLOGIC STUDY N/A 08/03/2016   Procedure: Atrial Fibrillation Ablation;  Surgeon: [employer],  MD;  Location: [place] [facility];  Service: Cardiovascular;  Laterality: N/A;  . ESOPHAGEAL DILATION  01-08-13   2 yrs ago  . HEEL SPUR SURGERY Left 2005  . KNEE ARTHROSCOPY  02/28/2012   Procedure: ARTHROSCOPY KNEE;  Surgeon: Tobi Bastos, MD;  [facility];  Service: Orthopedics;  Laterality: Left;  . LAPAROSCOPIC CHOLECYSTECTOMY  03/2002  . REVERSE SHOULDER ARTHROPLASTY Right 10/10/2019   Procedure: REVERSE SHOULDER ARTHROPLASTY;  Surgeon: Justice Britain, MD;  [facility]: [facility];  Service: Orthopedics;  Laterality: Right;  146min  . SHOULDER OPEN ROTATOR CUFF REPAIR Right 2011  . TONSILLECTOMY AND ADENOIDECTOMY  1965  . TOTAL KNEE ARTHROPLASTY Left 01/17/2013   Procedure: LEFT TOTAL KNEE ARTHROPLASTY;  Surgeon: Tobi Bastos, MD;  [facility];  Service:  Orthopedics;  Laterality: Left;  . TOTAL KNEE ARTHROPLASTY Right 06/19/2014   Procedure: RIGHT TOTAL KNEE ARTHROPLASTY;  Surgeon: Tobi Bastos, MD;  [facility];  Service: Orthopedics;  Laterality: Right;  . TUBAL LIGATION    . VAGINAL HYSTERECTOMY     "partial"  . WRIST FLEXION TENDON TENOTOMIES AND PROXIMAL CORPECTOMY W/ WRIST ARTHRODESIS&ILIAC CREST BONE GRAFT      There were no vitals filed for this visit.  Subjective Assessment - 11/12/19 1604    Subjective  COVID-19 screen performed prior to patient entering clinic.  Doing my home exercises.    Pertinent History  R reverse shoulder arthroplasty 10/10/2019    Patient Stated Goals  "Be able to use my arm again"    Currently in Pain?  Yes    Pain Score  2     Pain Location  Shoulder    Pain Orientation  Right    Pain Descriptors / Indicators  Aching;Dull    Pain Onset  More than a month ago                       [facility] Adult PT Treatment/Exercise - 11/12/19 0001      Exercises   Exercises  Shoulder      Shoulder Exercises: Sidelying   Other Sidelying Exercises  Seated UE Ranger x 3 minutes f/b wall ladder with right elbow cupped with left hand x 2 minutes.      Shoulder Exercises: Pulleys   Flexion  5 minutes      Vasopneumatic   Number Minutes Vasopneumatic   15 minutes    Vasopnuematic Location   --   Right shoulder.  Pillow between rt elbow and thorax.   Vasopneumatic Pressure  Low      Manual Therapy   Manual Therapy  Passive ROM    Passive ROM  In supine:  PROM to patient's right shoulder per protocol guidelines x 13 minutes.      IFC at 80-150 Hz (non-motoric) x 15 minutes.            PT Long Term Goals - 10/28/19 1607      PT LONG TERM GOAL #1   Title  Independent with a HEP.    Time  6    Period  Weeks    Status  New      PT LONG TERM GOAL #2   Title  pt will improve her R shoulder flexion to >/= 115 degrees.    Baseline  see flowsheets    Time  8    Period  Weeks     Status  New      PT LONG TERM  GOAL #3   Title  Pt will report improved sleep by 50%.    Baseline  unable to sleep through the night.    Time  6    Status  New      PT LONG TERM GOAL #4   Title  Pt will be able to report pain </= 2/10 with basic ADL's within protocol guidelines.    Baseline  6/10 at rest, unable to perform ADL's without assistance    Time  12    Period  Weeks    Status  New      PT LONG TERM GOAL #5   Title  pt will improve AROM to >/= 150 degrees flexion and ER to >/= 60 degrees.    Baseline  see flow sheet    Time  12    Period  Weeks    Status  New            Plan - 11/12/19 1607    Clinical Impression Statement  Patient progressing very well per protocol.  She did very well with the addition of active-assistive exercise today.    Personal Factors and Comorbidities  Comorbidity 3+    Comorbidities  DM, CHF, bipolar CKD, anxiety, depression, barrett esophagus, COPD, L TKA, L knee arthroscopy, cardica cath, wrist flexion tendenodesis    Examination-Activity Limitations  Carry;Dressing;Sleep;Reach Overhead;Lift    Stability/Clinical Decision Making  Stable/Uncomplicated    Rehab Potential  Good    PT Frequency  2x / week    PT Duration  12 weeks    PT Treatment/Interventions  ADLs/Self Care Home Management;Cryotherapy;Electrical Stimulation;Moist Heat;Ultrasound;Functional mobility training;Stair training;Therapeutic activities;Therapeutic exercise;Neuromuscular re-education;Manual techniques;Taping;Vasopneumatic Device    PT Next Visit Plan  cont with POC for PROM, pendulums, grip strengthening, E-stim, Vaso    PT Home Exercise Plan  see pt instructions    Consulted and Agree with Plan of Care  Patient       Patient will benefit from skilled therapeutic intervention in order to improve the following deficits and impairments:  Pain, Postural dysfunction, Impaired UE functional use, Decreased strength, Decreased range of motion, Increased edema  Visit  Diagnosis: Stiffness of right shoulder, not elsewhere classified  Acute pain of right shoulder     Problem List Patient Active Problem List   Diagnosis Date Noted  . Cervical disc herniation 01/31/2019  . Stomatitis 07/03/2018  . TMJ (sprain of temporomandibular joint), initial encounter 07/03/2018  . Tremor, essential 03/20/2017  . Syncope and collapse 03/20/2017  . AF (atrial fibrillation) ([place]) 08/03/2016  . Hypokalemia 02/25/2016  . Anxiety 02/24/2016  . Stroke-like symptoms 02/24/2016  . Weakness 01/26/2016  . Atrial fibrillation ([place]) 01/26/2016  . History of colon polyps 01/28/2015  . Dysphagia 01/28/2015  . History of esophageal stricture 01/28/2015  . Primary osteoarthritis of right knee 06/24/2014  . Acute systolic heart failure ([place]) 06/23/2014  . Takotsubo syndrome 06/21/2014  . Elevated troponin 06/20/2014  . Atrial fibrillation with RVR ([place]) 06/20/2014  . Congestive dilated cardiomyopathy ([place]) 06/20/2014  . Demand ischemia of myocardium ([place]) 06/20/2014  . 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 Respiratory failure requiring intubation ([place]) 06/19/2014  . Acute pulmonary edema ([place]) 06/19/2014  . Acute respiratory failure with hypoxia ([place]) 06/19/2014  . Hypoxemia   . Arterial hypotension   . Gait instability 10/26/2013  . Stroke ([place]) 10/26/2013  . TIA (transient ischemic attack) 10/26/2013  . Tobacco user 05/20/2013  . Postoperative anemia due to acute blood loss 01/18/2013  .  Osteoarthritis of left knee 01/17/2013  . Essential hypertension 12/07/2012  . Peripheral edema 12/07/2012  . Diabetes mellitus with complication ([place]) [id number]  . COPD mixed type ([place]) 08/05/2012  . Seasonal and perennial allergic rhinitis 08/05/2012  . Urticaria 08/05/2012  . Chest pain 02/06/2012  . Hyperlipidemia 02/06/2012    Chadric Kimberley, [place] MPT 11/12/2019, 4:17 PM  [facility] [address] [place], [place], [address] Phone: [phone]   Fax:  [phone]  Name: JONE CANNISTRA MRN: [id number] Date of Birth: 27-Mar-1951

## 2019-11-14 ENCOUNTER — Other Ambulatory Visit: Payer: Self-pay

## 2019-11-14 ENCOUNTER — Ambulatory Visit: Payer: Medicare Other | Admitting: Physical Therapy

## 2019-11-14 DIAGNOSIS — M25611 Stiffness of right shoulder, not elsewhere classified: Secondary | ICD-10-CM

## 2019-11-14 DIAGNOSIS — M25511 Pain in right shoulder: Secondary | ICD-10-CM

## 2019-11-14 NOTE — Therapy (Signed)
[facility] [place], [place], [address] Phone: [phone]   Fax:  [phone]  Physical Therapy Treatment  Patient Details  Name: [name] [name] MRN: [id number] Date of Birth: 10-Sep-1950 Referring Provider (PT): Dr. Justice Britain   Encounter Date: 11/14/2019  PT End of Session - 11/14/19 1716    Visit Number  6    Number of Visits  24    Date for PT Re-Evaluation  12/27/19    PT Start Time  0400    PT Stop Time  0454    PT Time Calculation (min)  54 min    Activity Tolerance  Patient tolerated treatment well    Behavior During Therapy  [facility] for tasks assessed/performed       Past Medical History:  Diagnosis Date  . Anemia    hx of years ago   . Anxiety   . Aortic valve disorders   . Arthritis   . Asthma   . Barrett esophagus   . Bipolar disorder ([place])   . CHF (congestive heart failure) ([place])   . Chronic bronchitis ([place])   . Chronic kidney disease    "I don't have but 1" (08/03/2016)  . Complication of anesthesia   . COPD (chronic obstructive pulmonary disease) ([place])    "just have a touch" (08/03/2016)  . Depression   . Esophageal reflux   . Esophageal stricture   . Family history of adverse reaction to anesthesia    brother had nausea and vomiting, mother has had nausea  . Fibromyalgia    "hands, knees, elbows; back; think it's in my hips" (08/03/2016)  . GAD (generalized anxiety disorder)   . GERD (gastroesophageal reflux disease)   . Hiatal hernia   . History of blood transfusion 1970s; 01/08/2013   "low blood count"  . History of kidney stones 01-08-13   past hx.  . History of nuclear stress test 01/2016   low risk study  . History of stomach ulcers    "some have bleed"  . Hyperlipemia   . Hypertension 12/07/2012  . Insomnia, unspecified   . Myocardial infarction [facility]) 01-08-13   '06-Chest pain-no stent-dx. MI-stress related  . Osteoporosis   . Personal history of colonic polyps 09/2010   TUBULAR  ADENOMAS (X3); NEGATIVE FOR HIGH GRADE DYSPLASIA OR MALIGNANCY.  [name] Pneumonia    "several times" (08/03/2016)  . PONV (postoperative nausea and vomiting)   . Stroke [facility])    "2 or 3"; remains with some right sided weaknes (08/03/2016)  . Takotsubo syndrome   . Tremor, essential 03/20/2017  . Type II diabetes mellitus ([place])   . Urinary frequency    AND INCONTINENCE    Past Surgical History:  Procedure Laterality Date  . ANTERIOR CERVICAL DECOMP/DISCECTOMY FUSION N/A 01/31/2019   Procedure: ANTERIOR CERVICAL DECOMPRESSION/DISCECTOMY FUSION C5-6;  Surgeon: Melina Schools, MD;  Location: [place];  Service: Orthopedics;  Laterality: N/A;  3 hrs  . ATRIAL FIBRILLATION ABLATION  08/03/2016  . CARDIAC CATHETERIZATION     normal per Dr Acie Fredrickson in note dated 02/06/12   . CARDIAC CATHETERIZATION     "I've had 2 or 3" (08/03/2016)  . CARPAL TUNNEL RELEASE Right   . CATARACT EXTRACTION W/ INTRAOCULAR LENS  IMPLANT, BILATERAL    . COLONOSCOPY W/ BIOPSIES AND POLYPECTOMY    . DILATION AND CURETTAGE OF UTERUS     S/P miscarriage  . ELECTROPHYSIOLOGIC STUDY N/A 08/03/2016   Procedure: Atrial Fibrillation Ablation;  Surgeon: [employer],  MD;  Location: [place] [facility];  Service: Cardiovascular;  Laterality: N/A;  . ESOPHAGEAL DILATION  01-08-13   2 yrs ago  . HEEL SPUR SURGERY Left 2005  . KNEE ARTHROSCOPY  02/28/2012   Procedure: ARTHROSCOPY KNEE;  Surgeon: Tobi Bastos, MD;  [facility];  Service: Orthopedics;  Laterality: Left;  . LAPAROSCOPIC CHOLECYSTECTOMY  03/2002  . REVERSE SHOULDER ARTHROPLASTY Right 10/10/2019   Procedure: REVERSE SHOULDER ARTHROPLASTY;  Surgeon: Justice Britain, MD;  [facility];  Service: Orthopedics;  Laterality: Right;  140min  . SHOULDER OPEN ROTATOR CUFF REPAIR Right 2011  . TONSILLECTOMY AND ADENOIDECTOMY  1965  . TOTAL KNEE ARTHROPLASTY Left 01/17/2013   Procedure: LEFT TOTAL KNEE ARTHROPLASTY;  Surgeon: Tobi Bastos, MD;  [facility];  Service:  Orthopedics;  Laterality: Left;  . TOTAL KNEE ARTHROPLASTY Right 06/19/2014   Procedure: RIGHT TOTAL KNEE ARTHROPLASTY;  Surgeon: Tobi Bastos, MD;  [facility];  Service: Orthopedics;  Laterality: Right;  . TUBAL LIGATION    . VAGINAL HYSTERECTOMY     "partial"  . WRIST FLEXION TENDON TENOTOMIES AND PROXIMAL CORPECTOMY W/ WRIST ARTHRODESIS&ILIAC CREST BONE GRAFT      There were no vitals filed for this visit.  Subjective Assessment - 11/14/19 1708    Subjective  COVID-19 screen performed prior to patient entering clinic.  Doing good.  Pain is low.    Pertinent History  R reverse shoulder arthroplasty 10/10/2019    Patient Stated Goals  "Be able to use my arm again"    Currently in Pain?  Yes    Pain Score  2     Pain Location  Shoulder    Pain Orientation  Right    Pain Descriptors / Indicators  Aching;Dull    Pain Type  Surgical pain    Pain Onset  More than a month ago                       [facility] Adult PT Treatment/Exercise - 11/14/19 0001      Shoulder Exercises: Supine   Other Supine Exercises  In supine:  Cane bench press and then bench press into flexion.      Shoulder Exercises: Seated   Other Seated Exercises  UE Ranger while seated x 3 minutes. f/b wall ladderx 3 minutes.      Shoulder Exercises: Pulleys   Flexion  --   6 minutes.     Vasopneumatic   Number Minutes Vasopneumatic   20 minutes    Vasopnuematic Location   --   RT shoulder with pillow btw thorax and elbow.   Vasopneumatic Pressure  Low      Manual Therapy   Manual Therapy  Passive ROM    Passive ROM  In supine:  PROM x 10 minutes to patient's right shoulder per protocol guidelines.                  PT Long Term Goals - 10/28/19 1607      PT LONG TERM GOAL #1   Title  Independent with a HEP.    Time  6    Period  Weeks    Status  New      PT LONG TERM GOAL #2   Title  pt will improve her R shoulder flexion to >/= 115 degrees.    Baseline  see flowsheets     Time  8    Period  Weeks  Status  New      PT LONG TERM GOAL #3   Title  Pt will report improved sleep by 50%.    Baseline  unable to sleep through the night.    Time  6    Status  New      PT LONG TERM GOAL #4   Title  Pt will be able to report pain </= 2/10 with basic ADL's within protocol guidelines.    Baseline  6/10 at rest, unable to perform ADL's without assistance    Time  12    Period  Weeks    Status  New      PT LONG TERM GOAL #5   Title  pt will improve AROM to >/= 150 degrees flexion and ER to >/= 60 degrees.    Baseline  see flow sheet    Time  12    Period  Weeks    Status  New            Plan - 11/14/19 1718    Clinical Impression Statement  Patient is making excellent progress.  Added supine cane exercises today and patient performed without complaint.    Personal Factors and Comorbidities  Comorbidity 3+    Comorbidities  DM, CHF, bipolar CKD, anxiety, depression, barrett esophagus, COPD, L TKA, L knee arthroscopy, cardica cath, wrist flexion tendenodesis    Examination-Activity Limitations  Carry;Dressing;Sleep;Reach Overhead;Lift    Examination-Participation Restrictions  Cleaning;Community Activity;Driving;Laundry;Other    Stability/Clinical Decision Making  Stable/Uncomplicated    Rehab Potential  Good    PT Frequency  2x / week    PT Duration  12 weeks    PT Treatment/Interventions  ADLs/Self Care Home Management;Cryotherapy;Electrical Stimulation;Moist Heat;Ultrasound;Functional mobility training;Stair training;Therapeutic activities;Therapeutic exercise;Neuromuscular re-education;Manual techniques;Taping;Vasopneumatic Device    PT Next Visit Plan  cont with POC for PROM, pendulums, grip strengthening, E-stim, Vaso    PT Home Exercise Plan  see pt instructions    Consulted and Agree with Plan of Care  Patient       Patient will benefit from skilled therapeutic intervention in order to improve the following deficits and impairments:  Pain,  Postural dysfunction, Impaired UE functional use, Decreased strength, Decreased range of motion, Increased edema  Visit Diagnosis: Stiffness of right shoulder, not elsewhere classified  Acute pain of right shoulder     Problem List Patient Active Problem List   Diagnosis Date Noted  . Cervical disc herniation 01/31/2019  . Stomatitis 07/03/2018  . TMJ (sprain of temporomandibular joint), initial encounter 07/03/2018  . Tremor, essential 03/20/2017  . Syncope and collapse 03/20/2017  . AF (atrial fibrillation) ([place]) 08/03/2016  . Hypokalemia 02/25/2016  . Anxiety 02/24/2016  . Stroke-like symptoms 02/24/2016  . Weakness 01/26/2016  . Atrial fibrillation ([place]) 01/26/2016  . History of colon polyps 01/28/2015  . Dysphagia 01/28/2015  . History of esophageal stricture 01/28/2015  . Primary osteoarthritis of right knee 06/24/2014  . Acute systolic heart failure ([place]) 06/23/2014  . Takotsubo syndrome 06/21/2014  . Elevated troponin 06/20/2014  . Atrial fibrillation with RVR ([place]) 06/20/2014  . Congestive dilated cardiomyopathy ([place]) 06/20/2014  . Demand ischemia of myocardium ([place]) 06/20/2014  . 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 Respiratory failure requiring intubation ([place]) 06/19/2014  . Acute pulmonary edema ([place]) 06/19/2014  . Acute respiratory failure with hypoxia ([place]) 06/19/2014  . Hypoxemia   . Arterial hypotension   . Gait instability 10/26/2013  . Stroke ([place]) 10/26/2013  . TIA (transient ischemic attack)  10/26/2013  . Tobacco user 05/20/2013  . Postoperative anemia due to acute blood loss 01/18/2013  . Osteoarthritis of left knee 01/17/2013  . Essential hypertension 12/07/2012  . Peripheral edema 12/07/2012  . Diabetes mellitus with complication ([place]) Q000111Q  . COPD mixed type ([place]) 08/05/2012  . Seasonal and perennial allergic rhinitis 08/05/2012  . Urticaria 08/05/2012  . Chest pain 02/06/2012  . Hyperlipidemia 02/06/2012     Damonica Chopra, [place] MPT 11/14/2019, 5:22 PM  [facility] [address] [place], [place], [address] Phone: [phone]   Fax:  [phone]  Name: [name] [name] MRN: [id number] Date of Birth: 1951-01-18

## 2019-11-19 ENCOUNTER — Ambulatory Visit: Payer: Medicare Other | Admitting: Physical Therapy

## 2019-11-21 ENCOUNTER — Ambulatory Visit: Payer: Medicare Other | Admitting: Physical Therapy

## 2019-11-21 ENCOUNTER — Encounter: Payer: Self-pay | Admitting: Physical Therapy

## 2019-11-21 ENCOUNTER — Other Ambulatory Visit: Payer: Self-pay

## 2019-11-21 DIAGNOSIS — R600 Localized edema: Secondary | ICD-10-CM

## 2019-11-21 DIAGNOSIS — M6281 Muscle weakness (generalized): Secondary | ICD-10-CM

## 2019-11-21 DIAGNOSIS — M25611 Stiffness of right shoulder, not elsewhere classified: Secondary | ICD-10-CM | POA: Diagnosis not present

## 2019-11-21 DIAGNOSIS — M25511 Pain in right shoulder: Secondary | ICD-10-CM

## 2019-11-21 NOTE — Therapy (Signed)
[facility] [place], [place], [address] Phone: [phone]   Fax:  [phone]  Physical Therapy Treatment  Patient Details  Name: [name] [name] MRN: [id number] Date of Birth: 30-Jun-1951 Referring Provider (PT): Dr. Justice Britain   Encounter Date: 11/21/2019  PT End of Session - 11/21/19 1536    Visit Number  7    Number of Visits  24    Date for PT Re-Evaluation  12/27/19    PT Start Time  0315    PT Stop Time  0359    PT Time Calculation (min)  44 min    Activity Tolerance  Patient tolerated treatment well    Behavior During Therapy  [facility] for tasks assessed/performed       Past Medical History:  Diagnosis Date  . Anemia    hx of years ago   . Anxiety   . Aortic valve disorders   . Arthritis   . Asthma   . Barrett esophagus   . Bipolar disorder ([place])   . CHF (congestive heart failure) ([employer])   . Chronic bronchitis ([place])   . Chronic kidney disease    "I don't have but 1" (08/03/2016)  . Complication of anesthesia   . COPD (chronic obstructive pulmonary disease) ([place])    "just have a touch" (08/03/2016)  . Depression   . Esophageal reflux   . Esophageal stricture   . Family history of adverse reaction to anesthesia    brother had nausea and vomiting, mother has had nausea  . Fibromyalgia    "hands, knees, elbows; back; think it's in my hips" (08/03/2016)  . GAD (generalized anxiety disorder)   . GERD (gastroesophageal reflux disease)   . Hiatal hernia   . History of blood transfusion 1970s; 01/08/2013   "low blood count"  . History of kidney stones 01-08-13   past hx.  . History of nuclear stress test 01/2016   low risk study  . History of stomach ulcers    "some have bleed"  . Hyperlipemia   . Hypertension 12/07/2012  . Insomnia, unspecified   . Myocardial infarction [facility]) 01-08-13   '06-Chest pain-no stent-dx. MI-stress related  . Osteoporosis   . Personal history of colonic polyps 09/2010   TUBULAR  ADENOMAS (X3); NEGATIVE FOR HIGH GRADE DYSPLASIA OR MALIGNANCY.  [name] Pneumonia    "several times" (08/03/2016)  . PONV (postoperative nausea and vomiting)   . Stroke [facility])    "2 or 3"; remains with some right sided weaknes (08/03/2016)  . Takotsubo syndrome   . Tremor, essential 03/20/2017  . Type II diabetes mellitus ([place])   . Urinary frequency    AND INCONTINENCE    Past Surgical History:  Procedure Laterality Date  . ANTERIOR CERVICAL DECOMP/DISCECTOMY FUSION N/A 01/31/2019   Procedure: ANTERIOR CERVICAL DECOMPRESSION/DISCECTOMY FUSION C5-6;  Surgeon: Melina Schools, MD;  Location: [place];  Service: Orthopedics;  Laterality: N/A;  3 hrs  . ATRIAL FIBRILLATION ABLATION  08/03/2016  . CARDIAC CATHETERIZATION     normal per Dr Acie Fredrickson in note dated 02/06/12   . CARDIAC CATHETERIZATION     "I've had 2 or 3" (08/03/2016)  . CARPAL TUNNEL RELEASE Right   . CATARACT EXTRACTION W/ INTRAOCULAR LENS  IMPLANT, BILATERAL    . COLONOSCOPY W/ BIOPSIES AND POLYPECTOMY    . DILATION AND CURETTAGE OF UTERUS     S/P miscarriage  . ELECTROPHYSIOLOGIC STUDY N/A 08/03/2016   Procedure: Atrial Fibrillation Ablation;  Surgeon: [employer],  MD;  Location: [place] [facility];  Service: Cardiovascular;  Laterality: N/A;  . ESOPHAGEAL DILATION  01-08-13   2 yrs ago  . HEEL SPUR SURGERY Left 2005  . KNEE ARTHROSCOPY  02/28/2012   Procedure: ARTHROSCOPY KNEE;  Surgeon: Tobi Bastos, MD;  [facility];  Service: Orthopedics;  Laterality: Left;  . LAPAROSCOPIC CHOLECYSTECTOMY  03/2002  . REVERSE SHOULDER ARTHROPLASTY Right 10/10/2019   Procedure: REVERSE SHOULDER ARTHROPLASTY;  Surgeon: Justice Britain, MD;  [facility];  Service: Orthopedics;  Laterality: Right;  128mn  . SHOULDER OPEN ROTATOR CUFF REPAIR Right 2011  . TONSILLECTOMY AND ADENOIDECTOMY  1965  . TOTAL KNEE ARTHROPLASTY Left 01/17/2013   Procedure: LEFT TOTAL KNEE ARTHROPLASTY;  Surgeon: RTobi Bastos MD;  [facility];  Service:  Orthopedics;  Laterality: Left;  . TOTAL KNEE ARTHROPLASTY Right 06/19/2014   Procedure: RIGHT TOTAL KNEE ARTHROPLASTY;  Surgeon: RTobi Bastos MD;  [facility];  Service: Orthopedics;  Laterality: Right;  . TUBAL LIGATION    . VAGINAL HYSTERECTOMY     "partial"  . WRIST FLEXION TENDON TENOTOMIES AND PROXIMAL CORPECTOMY W/ WRIST ARTHRODESIS&ILIAC CREST BONE GRAFT      There were no vitals filed for this visit.  Subjective Assessment - 11/21/19 1517    Subjective  COVID-19 screen performed prior to patient entering clinic.  Patient arrived with some pain, went to MD and is doing good    Pertinent History  R reverse shoulder arthroplasty 10/10/2019    Patient Stated Goals  "Be able to use my arm again"    Currently in Pain?  Yes    Pain Score  2     Pain Location  Shoulder    Pain Orientation  Right    Pain Descriptors / Indicators  Aching    Pain Type  Surgical pain    Pain Onset  More than a month ago    Pain Frequency  Intermittent    Aggravating Factors   movement    Pain Relieving Factors  at rest         [facility] 0001      ROM / Strength   AROM / PROM / Strength  PROM      PROM   Right/Left Shoulder  Right    Right Shoulder Flexion  105 Degrees    Right Shoulder External Rotation  35 Degrees                   [facility] Adult PT Treatment/Exercise - 11/21/19 0001      Shoulder Exercises: Pulleys   Flexion  5 minutes    Other Pulley Exercises  UE ranger with left UE support 2x10      Electrical Stimulation   Electrical Stimulation Location  RT shoulder.    Electrical Stimulation Action  premod    EPrintmakerParameters  80-_0  x140m    Electrical Stimulation Goals  Pain      Vasopneumatic   Number Minutes Vasopneumatic   15 minutes    Vasopnuematic Location   Shoulder    Vasopneumatic Pressure  Low    Vasopneumatic Temperature   34 for edema      Manual Therapy   Manual Therapy  Passive ROM    Passive  ROM  In supine:  PROM to right shoulder per protocol guidelines.                  PT Long Term Goals -  11/21/19 1534      PT LONG TERM GOAL #1   Title  Independent with a HEP.    Time  6    Period  Weeks    Status  On-going      PT LONG TERM GOAL #2   Title  pt will improve her R shoulder flexion to >/= 115 degrees.    Time  8    Period  Weeks    Status  On-going      PT LONG TERM GOAL #3   Title  Pt will report improved sleep by 50%.    Baseline  MET 11/21/19    Time  6    Period  Weeks    Status  Achieved   improved more than 50% 11/21/19     PT LONG TERM GOAL #4   Title  Pt will be able to report pain </= 2/10 with basic ADL's within protocol guidelines.    Baseline  6/10 at rest, unable to perform ADL's without assistance    Time  12    Period  Weeks    Status  On-going      PT LONG TERM GOAL #5   Title  pt will improve AROM to >/= 150 degrees flexion and ER to >/= 60 degrees.    Baseline  see flow sheet    Time  12    Period  Weeks    Status  On-going            Plan - 11/21/19 1545    Clinical Impression Statement  Patient tolerated treatment well today. Patient went to MD and is progressing well and is to cont with protocol and return in 6 weeks. Patient is progressing with ROM per protocol WNL. Patient is doing HEP as instructed and able to sleep more than 50% improved. Patient met LTG #3 today with others progressing.    Personal Factors and Comorbidities  Comorbidity 3+    Comorbidities  DM, CHF, bipolar CKD, anxiety, depression, barrett esophagus, COPD, L TKA, L knee arthroscopy, cardica cath, wrist flexion tendenodesis    Examination-Activity Limitations  Carry;Dressing;Sleep;Reach Overhead;Lift    Examination-Participation Restrictions  Cleaning;Community Activity;Driving;Laundry;Other    Stability/Clinical Decision Making  Stable/Uncomplicated    Rehab Potential  Good    PT Frequency  2x / week    PT Duration  12 weeks    PT  Treatment/Interventions  ADLs/Self Care Home Management;Cryotherapy;Electrical Stimulation;Moist Heat;Ultrasound;Functional mobility training;Stair training;Therapeutic activities;Therapeutic exercise;Neuromuscular re-education;Manual techniques;Taping;Vasopneumatic Device    PT Next Visit Plan  cont with POC for PROM, pendulums, grip strengthening, E-stim, Vaso (5 weeks 11/20/19)    Consulted and Agree with Plan of Care  Patient       Patient will benefit from skilled therapeutic intervention in order to improve the following deficits and impairments:  Pain, Postural dysfunction, Impaired UE functional use, Decreased strength, Decreased range of motion, Increased edema  Visit Diagnosis: Stiffness of right shoulder, not elsewhere classified  Acute pain of right shoulder  Muscle weakness (generalized)  Localized edema     Problem List Patient Active Problem List   Diagnosis Date Noted  . Cervical disc herniation 01/31/2019  . Stomatitis 07/03/2018  . TMJ (sprain of temporomandibular joint), initial encounter 07/03/2018  . Tremor, essential 03/20/2017  . Syncope and collapse 03/20/2017  . AF (atrial fibrillation) ([place]) 08/03/2016  . Hypokalemia 02/25/2016  . Anxiety 02/24/2016  . Stroke-like symptoms 02/24/2016  . Weakness 01/26/2016  . Atrial fibrillation ([place]) 01/26/2016  .  History of colon polyps 01/28/2015  . Dysphagia 01/28/2015  . History of esophageal stricture 01/28/2015  . Primary osteoarthritis of right knee 06/24/2014  . Acute systolic heart failure ([place]) 06/23/2014  . Takotsubo syndrome 06/21/2014  . Elevated troponin 06/20/2014  . Atrial fibrillation with RVR ([place]) 06/20/2014  . Congestive dilated cardiomyopathy ([place]) 06/20/2014  . Demand ischemia of myocardium ([place]) 06/20/2014  . 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 Respiratory failure requiring intubation ([place]) 06/19/2014  . Acute pulmonary edema ([place]) 06/19/2014  . Acute respiratory  failure with hypoxia ([place]) 06/19/2014  . Hypoxemia   . Arterial hypotension   . Gait instability 10/26/2013  . Stroke ([place]) 10/26/2013  . TIA (transient ischemic attack) 10/26/2013  . Tobacco user 05/20/2013  . Postoperative anemia due to acute blood loss 01/18/2013  . Osteoarthritis of left knee 01/17/2013  . Essential hypertension 12/07/2012  . Peripheral edema 12/07/2012  . Diabetes mellitus with complication ([place]) 92/33/0076  . COPD mixed type ([place]) 08/05/2012  . Seasonal and perennial allergic rhinitis 08/05/2012  . Urticaria 08/05/2012  . Chest pain 02/06/2012  . Hyperlipidemia 02/06/2012    Phillips Climes, PTA 11/21/2019, 4:01 PM  [facility] [place], [place], [address] Phone: [phone]   Fax:  [phone]  Name: [name] [name] MRN: [id number] Date of Birth: 1951/01/05

## 2019-11-22 ENCOUNTER — Ambulatory Visit: Payer: Medicare Other | Admitting: Physical Therapy

## 2019-11-22 DIAGNOSIS — M25611 Stiffness of right shoulder, not elsewhere classified: Secondary | ICD-10-CM | POA: Diagnosis not present

## 2019-11-22 DIAGNOSIS — M25511 Pain in right shoulder: Secondary | ICD-10-CM

## 2019-11-22 DIAGNOSIS — M6281 Muscle weakness (generalized): Secondary | ICD-10-CM

## 2019-11-22 NOTE — Therapy (Signed)
[facility] [place], [place], [address] Phone: [phone]   Fax:  [phone]  Physical Therapy Treatment  Patient Details  Name: [name] [name] MRN: [id number] Date of Birth: Oct 17, 1950 Referring Provider (PT): Dr. Justice Britain   Encounter Date: 11/22/2019  PT End of Session - 11/22/19 1132    Visit Number  8    Number of Visits  24    Date for PT Re-Evaluation  12/27/19    PT Start Time  3568    PT Stop Time  1032    PT Time Calculation (min)  45 min       Past Medical History:  Diagnosis Date  . Anemia    hx of years ago   . Anxiety   . Aortic valve disorders   . Arthritis   . Asthma   . Barrett esophagus   . Bipolar disorder ([place])   . CHF (congestive heart failure) ([place])   . Chronic bronchitis ([place])   . Chronic kidney disease    "I don't have but 1" (08/03/2016)  . Complication of anesthesia   . COPD (chronic obstructive pulmonary disease) ([place])    "just have a touch" (08/03/2016)  . Depression   . Esophageal reflux   . Esophageal stricture   . Family history of adverse reaction to anesthesia    brother had nausea and vomiting, mother has had nausea  . Fibromyalgia    "hands, knees, elbows; back; think it's in my hips" (08/03/2016)  . GAD (generalized anxiety disorder)   . GERD (gastroesophageal reflux disease)   . Hiatal hernia   . History of blood transfusion 1970s; 01/08/2013   "low blood count"  . History of kidney stones 01-08-13   past hx.  . History of nuclear stress test 01/2016   low risk study  . History of stomach ulcers    "some have bleed"  . Hyperlipemia   . Hypertension 12/07/2012  . Insomnia, unspecified   . Myocardial infarction [facility]) 01-08-13   '06-Chest pain-no stent-dx. MI-stress related  . Osteoporosis   . Personal history of colonic polyps 09/2010   TUBULAR ADENOMAS (X3); NEGATIVE FOR HIGH GRADE DYSPLASIA OR MALIGNANCY.  [name] Pneumonia    "several times" (08/03/2016)  . PONV  (postoperative nausea and vomiting)   . Stroke [facility])    "2 or 3"; remains with some right sided weaknes (08/03/2016)  . Takotsubo syndrome   . Tremor, essential 03/20/2017  . Type II diabetes mellitus ([place])   . Urinary frequency    AND INCONTINENCE    Past Surgical History:  Procedure Laterality Date  . ANTERIOR CERVICAL DECOMP/DISCECTOMY FUSION N/A 01/31/2019   Procedure: ANTERIOR CERVICAL DECOMPRESSION/DISCECTOMY FUSION C5-6;  Surgeon: Melina Schools, MD;  Location: [place];  Service: Orthopedics;  Laterality: N/A;  3 hrs  . ATRIAL FIBRILLATION ABLATION  08/03/2016  . CARDIAC CATHETERIZATION     normal per Dr Acie Fredrickson in note dated 02/06/12   . CARDIAC CATHETERIZATION     "I've had 2 or 3" (08/03/2016)  . CARPAL TUNNEL RELEASE Right   . CATARACT EXTRACTION W/ INTRAOCULAR LENS  IMPLANT, BILATERAL    . COLONOSCOPY W/ BIOPSIES AND POLYPECTOMY    . DILATION AND CURETTAGE OF UTERUS     S/P miscarriage  . ELECTROPHYSIOLOGIC STUDY N/A 08/03/2016   Procedure: Atrial Fibrillation Ablation;  Surgeon: Will Meredith Leeds, MD;  Location: [place] [facility];  Service: Cardiovascular;  Laterality: N/A;  . ESOPHAGEAL DILATION  01-08-13  2 yrs ago  . HEEL SPUR SURGERY Left 2005  . KNEE ARTHROSCOPY  02/28/2012   Procedure: ARTHROSCOPY KNEE;  Surgeon: Tobi Bastos, MD;  [facility];  Service: Orthopedics;  Laterality: Left;  . LAPAROSCOPIC CHOLECYSTECTOMY  03/2002  . REVERSE SHOULDER ARTHROPLASTY Right 10/10/2019   Procedure: REVERSE SHOULDER ARTHROPLASTY;  Surgeon: Justice Britain, MD;  [facility];  Service: Orthopedics;  Laterality: Right;  149mn  . SHOULDER OPEN ROTATOR CUFF REPAIR Right 2011  . TONSILLECTOMY AND ADENOIDECTOMY  1965  . TOTAL KNEE ARTHROPLASTY Left 01/17/2013   Procedure: LEFT TOTAL KNEE ARTHROPLASTY;  Surgeon: RTobi Bastos MD;  [facility];  Service: Orthopedics;  Laterality: Left;  . TOTAL KNEE ARTHROPLASTY Right 06/19/2014   Procedure: RIGHT TOTAL KNEE ARTHROPLASTY;   Surgeon: RTobi Bastos MD;  [facility];  Service: Orthopedics;  Laterality: Right;  . TUBAL LIGATION    . VAGINAL HYSTERECTOMY     "partial"  . WRIST FLEXION TENDON TENOTOMIES AND PROXIMAL CORPECTOMY W/ WRIST ARTHRODESIS&ILIAC CREST BONE GRAFT      There were no vitals filed for this visit.  Subjective Assessment - 11/22/19 1131    Subjective  COVID-19 screen performed prior to patient entering clinic.  Doing good.    Pertinent History  R reverse shoulder arthroplasty 10/10/2019    Patient Stated Goals  "Be able to use my arm again"    Currently in Pain?  Yes    Pain Location  Shoulder    Pain Orientation  Right    Pain Descriptors / Indicators  Aching    Pain Onset  More than a month ago                       [facility] - 11/22/19 0001      Shoulder Exercises: Pulleys   Flexion  5 minutes    Other Pulley Exercises  UE ranger with left UE support into flexion x 2 minutes f/b wall ladder x 3 minutes.      Modalities   Modalities  EPsychologist, educationalLocation  RT shoulder.    Electrical Stimulation Action  Pre-mod.    Electrical Stimulation Parameters  80-150 Hz x 15 minutes.      Vasopneumatic   Number Minutes Vasopneumatic   15 minutes    Vasopnuematic Location   --   Right shoulder.   Vasopneumatic Pressure  Low      Manual Therapy   Manual Therapy  Passive ROM    Passive ROM  In supine:  Right shoulder PROM, AAROM and rhy stabs x 13 minutes.                  PT Long Term Goals - 11/21/19 1534      PT LONG TERM GOAL #1   Title  Independent with a HEP.    Time  6    Period  Weeks    Status  On-going      PT LONG TERM GOAL #2   Title  pt will improve her R shoulder flexion to >/= 115 degrees.    Time  8    Period  Weeks    Status  On-going      PT LONG TERM GOAL #3   Title  Pt will report improved sleep by 50%.    Baseline  MET  11/21/19    Time  6  Period  Weeks    Status  Achieved   improved more than 50% 11/21/19     PT LONG TERM GOAL #4   Title  Pt will be able to report pain </= 2/10 with basic ADL's within protocol guidelines.    Baseline  6/10 at rest, unable to perform ADL's without assistance    Time  12    Period  Weeks    Status  On-going      PT LONG TERM GOAL #5   Title  pt will improve AROM to >/= 150 degrees flexion and ER to >/= 60 degrees.    Baseline  see flow sheet    Time  12    Period  Weeks    Status  On-going            Plan - 11/22/19 1136    Clinical Impression Statement  The patient is making good progress per protocol.  While performing active-assistive range of motion she does feel an occasional "catch".  She is very pleased with her progress thus far.    Personal Factors and Comorbidities  Comorbidity 3+    Comorbidities  DM, CHF, bipolar CKD, anxiety, depression, barrett esophagus, COPD, L TKA, L knee arthroscopy, cardica cath, wrist flexion tendenodesis    Examination-Activity Limitations  Carry;Dressing;Sleep;Reach Overhead;Lift    Examination-Participation Restrictions  Cleaning;Community Activity;Driving;Laundry;Other    Stability/Clinical Decision Making  Stable/Uncomplicated    Rehab Potential  Good    PT Frequency  2x / week    PT Duration  12 weeks    PT Treatment/Interventions  ADLs/Self Care Home Management;Cryotherapy;Electrical Stimulation;Moist Heat;Ultrasound;Functional mobility training;Stair training;Therapeutic activities;Therapeutic exercise;Neuromuscular re-education;Manual techniques;Taping;Vasopneumatic Device    PT Next Visit Plan  cont with POC for PROM, pendulums, grip strengthening, E-stim, Vaso (5 weeks 11/20/19)    PT Home Exercise Plan  see pt instructions    Consulted and Agree with Plan of Care  Patient       Patient will benefit from skilled therapeutic intervention in order to improve the following deficits and impairments:  Pain,  Postural dysfunction, Impaired UE functional use, Decreased strength, Decreased range of motion, Increased edema  Visit Diagnosis: Stiffness of right shoulder, not elsewhere classified  Acute pain of right shoulder  Muscle weakness (generalized)     Problem List Patient Active Problem List   Diagnosis Date Noted  . Cervical disc herniation 01/31/2019  . Stomatitis 07/03/2018  . TMJ (sprain of temporomandibular joint), initial encounter 07/03/2018  . Tremor, essential 03/20/2017  . Syncope and collapse 03/20/2017  . AF (atrial fibrillation) ([place]) 08/03/2016  . Hypokalemia 02/25/2016  . Anxiety 02/24/2016  . Stroke-like symptoms 02/24/2016  . Weakness 01/26/2016  . Atrial fibrillation ([place]) 01/26/2016  . History of colon polyps 01/28/2015  . Dysphagia 01/28/2015  . History of esophageal stricture 01/28/2015  . Primary osteoarthritis of right knee 06/24/2014  . Acute systolic heart failure ([place]) 06/23/2014  . Takotsubo syndrome 06/21/2014  . Elevated troponin 06/20/2014  . Atrial fibrillation with RVR ([place]) 06/20/2014  . Congestive dilated cardiomyopathy ([place]) 06/20/2014  . Demand ischemia of myocardium ([place]) 06/20/2014  . 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 Respiratory failure requiring intubation ([place]) 06/19/2014  . Acute pulmonary edema ([place]) 06/19/2014  . Acute respiratory failure with hypoxia ([place]) 06/19/2014  . Hypoxemia   . Arterial hypotension   . Gait instability 10/26/2013  . Stroke ([place]) 10/26/2013  . TIA (transient ischemic attack) 10/26/2013  . Tobacco user 05/20/2013  . Postoperative anemia  due to acute blood loss 01/18/2013  . Osteoarthritis of left knee 01/17/2013  . Essential hypertension 12/07/2012  . Peripheral edema 12/07/2012  . Diabetes mellitus with complication ([place]) 98/28/6751  . COPD mixed type ([place]) 08/05/2012  . Seasonal and perennial allergic rhinitis 08/05/2012  . Urticaria 08/05/2012  . Chest pain 02/06/2012  .  Hyperlipidemia 02/06/2012    [name] [name], [name] [name] 11/22/2019, 11:38 AM  [facility] [address] [place], [place], [address] Phone: [phone]   Fax:  [phone]  Name: [name] [name] MRN: [id number] Date of Birth: 01-Jul-1951

## 2019-11-26 ENCOUNTER — Ambulatory Visit: Payer: Medicare Other | Admitting: Physical Therapy

## 2019-11-26 ENCOUNTER — Other Ambulatory Visit: Payer: Self-pay

## 2019-11-26 DIAGNOSIS — M25611 Stiffness of right shoulder, not elsewhere classified: Secondary | ICD-10-CM | POA: Diagnosis not present

## 2019-11-26 DIAGNOSIS — M25511 Pain in right shoulder: Secondary | ICD-10-CM

## 2019-11-26 NOTE — Therapy (Signed)
[facility] [place], [place], [address] Phone: [phone]   Fax:  [phone]  Physical Therapy Treatment  Patient Details  Name: [name] [name] MRN: [id number] Date of Birth: 01-Apr-1951 Referring Provider (PT): Dr. Justice Britain   Encounter Date: 11/26/2019  PT End of Session - 11/26/19 1732    Visit Number  9    Number of Visits  24    Date for PT Re-Evaluation  12/27/19    PT Start Time  0448    PT Stop Time  0537    PT Time Calculation (min)  49 min    Activity Tolerance  Patient tolerated treatment well    Behavior During Therapy  [facility] for tasks assessed/performed       Past Medical History:  Diagnosis Date  . Anemia    hx of years ago   . Anxiety   . Aortic valve disorders   . Arthritis   . Asthma   . Barrett esophagus   . Bipolar disorder ([place])   . CHF (congestive heart failure) ([place])   . Chronic bronchitis ([place])   . Chronic kidney disease    "I don't have but 1" (08/03/2016)  . Complication of anesthesia   . COPD (chronic obstructive pulmonary disease) ([place])    "just have a touch" (08/03/2016)  . Depression   . Esophageal reflux   . Esophageal stricture   . Family history of adverse reaction to anesthesia    brother had nausea and vomiting, mother has had nausea  . Fibromyalgia    "hands, knees, elbows; back; think it's in my hips" (08/03/2016)  . GAD (generalized anxiety disorder)   . GERD (gastroesophageal reflux disease)   . Hiatal hernia   . History of blood transfusion 1970s; 01/08/2013   "low blood count"  . History of kidney stones 01-08-13   past hx.  . History of nuclear stress test 01/2016   low risk study  . History of stomach ulcers    "some have bleed"  . Hyperlipemia   . Hypertension 12/07/2012  . Insomnia, unspecified   . Myocardial infarction [facility]) 01-08-13   '06-Chest pain-no stent-dx. MI-stress related  . Osteoporosis   . Personal history of colonic polyps 09/2010   TUBULAR  ADENOMAS (X3); NEGATIVE FOR HIGH GRADE DYSPLASIA OR MALIGNANCY.  [name] Pneumonia    "several times" (08/03/2016)  . PONV (postoperative nausea and vomiting)   . Stroke [facility])    "2 or 3"; remains with some right sided weaknes (08/03/2016)  . Takotsubo syndrome   . Tremor, essential 03/20/2017  . Type II diabetes mellitus ([place])   . Urinary frequency    AND INCONTINENCE    Past Surgical History:  Procedure Laterality Date  . ANTERIOR CERVICAL DECOMP/DISCECTOMY FUSION N/A 01/31/2019   Procedure: ANTERIOR CERVICAL DECOMPRESSION/DISCECTOMY FUSION C5-6;  Surgeon: Melina Schools, MD;  Location: [place];  Service: Orthopedics;  Laterality: N/A;  3 hrs  . ATRIAL FIBRILLATION ABLATION  08/03/2016  . CARDIAC CATHETERIZATION     normal per Dr Acie Fredrickson in note dated 02/06/12   . CARDIAC CATHETERIZATION     "I've had 2 or 3" (08/03/2016)  . CARPAL TUNNEL RELEASE Right   . CATARACT EXTRACTION W/ INTRAOCULAR LENS  IMPLANT, BILATERAL    . COLONOSCOPY W/ BIOPSIES AND POLYPECTOMY    . DILATION AND CURETTAGE OF UTERUS     S/P miscarriage  . ELECTROPHYSIOLOGIC STUDY N/A 08/03/2016   Procedure: Atrial Fibrillation Ablation;  Surgeon: [employer],  MD;  Location: [place] [facility];  Service: Cardiovascular;  Laterality: N/A;  . ESOPHAGEAL DILATION  01-08-13   2 yrs ago  . HEEL SPUR SURGERY Left 2005  . KNEE ARTHROSCOPY  02/28/2012   Procedure: ARTHROSCOPY KNEE;  Surgeon: Tobi Bastos, MD;  [facility];  Service: Orthopedics;  Laterality: Left;  . LAPAROSCOPIC CHOLECYSTECTOMY  03/2002  . REVERSE SHOULDER ARTHROPLASTY Right 10/10/2019   Procedure: REVERSE SHOULDER ARTHROPLASTY;  Surgeon: Justice Britain, MD;  [facility];  Service: Orthopedics;  Laterality: Right;  12mn  . SHOULDER OPEN ROTATOR CUFF REPAIR Right 2011  . TONSILLECTOMY AND ADENOIDECTOMY  1965  . TOTAL KNEE ARTHROPLASTY Left 01/17/2013   Procedure: LEFT TOTAL KNEE ARTHROPLASTY;  Surgeon: RTobi Bastos MD;  [facility];  Service:  Orthopedics;  Laterality: Left;  . TOTAL KNEE ARTHROPLASTY Right 06/19/2014   Procedure: RIGHT TOTAL KNEE ARTHROPLASTY;  Surgeon: RTobi Bastos MD;  [facility];  Service: Orthopedics;  Laterality: Right;  . TUBAL LIGATION    . VAGINAL HYSTERECTOMY     "partial"  . WRIST FLEXION TENDON TENOTOMIES AND PROXIMAL CORPECTOMY W/ WRIST ARTHRODESIS&ILIAC CREST BONE GRAFT      There were no vitals filed for this visit.  Subjective Assessment - 11/26/19 1731    Subjective  COVID-19 screen performed prior to patient entering clinic. Working in gardening lately.    Pertinent History  R reverse shoulder arthroplasty 10/10/2019    Patient Stated Goals  "Be able to use my arm again"    Currently in Pain?  Yes    Pain Score  2     Pain Location  Shoulder    Pain Orientation  Right    Pain Onset  More than a month ago                       [facility] - 11/26/19 0001      Shoulder Exercises: Pulleys   Flexion  5 minutes    Other Pulley Exercises  Wall ladder x 3 minutes and UE ranger on wall x 3 minutes into flexion and circles (CCW and CW) and ball on wall x 3 minutes.      Vasopneumatic   Number Minutes Vasopneumatic   15 minutes    Vasopnuematic Location   --   Right shoulder.   Vasopneumatic Pressure  Low      Manual Therapy   Manual Therapy  Passive ROM    Passive ROM  In supine:  Right shoulder PROM and AAROM x 9 minutes.                  PT Long Term Goals - 11/21/19 1534      PT LONG TERM GOAL #1   Title  Independent with a HEP.    Time  6    Period  Weeks    Status  On-going      PT LONG TERM GOAL #2   Title  pt will improve her R shoulder flexion to >/= 115 degrees.    Time  8    Period  Weeks    Status  On-going      PT LONG TERM GOAL #3   Title  Pt will report improved sleep by 50%.    Baseline  MET 11/21/19    Time  6    Period  Weeks    Status  Achieved   improved more than 50% 11/21/19  PT LONG TERM  GOAL #4   Title  Pt will be able to report pain </= 2/10 with basic ADL's within protocol guidelines.    Baseline  6/10 at rest, unable to perform ADL's without assistance    Time  12    Period  Weeks    Status  On-going      PT LONG TERM GOAL #5   Title  pt will improve AROM to >/= 150 degrees flexion and ER to >/= 60 degrees.    Baseline  see flow sheet    Time  12    Period  Weeks    Status  On-going            Plan - 11/26/19 1740    Clinical Impression Statement  Patient doing very well with low pain-level in spite of doing garden work.  Her range of motion is improving very nicley per protocol guidelines.  Added ball on wall today which patient performed without complaint.    Personal Factors and Comorbidities  Comorbidity 3+    Comorbidities  DM, CHF, bipolar CKD, anxiety, depression, barrett esophagus, COPD, L TKA, L knee arthroscopy, cardica cath, wrist flexion tendenodesis    Examination-Activity Limitations  Carry;Dressing;Sleep;Reach Overhead;Lift    Examination-Participation Restrictions  Cleaning;Community Activity;Driving;Laundry;Other    Stability/Clinical Decision Making  Stable/Uncomplicated    Rehab Potential  Good    PT Frequency  2x / week    PT Duration  12 weeks    PT Treatment/Interventions  ADLs/Self Care Home Management;Cryotherapy;Electrical Stimulation;Moist Heat;Ultrasound;Functional mobility training;Stair training;Therapeutic activities;Therapeutic exercise;Neuromuscular re-education;Manual techniques;Taping;Vasopneumatic Device    PT Next Visit Plan  cont with POC for PROM, pendulums, grip strengthening, E-stim, Vaso (5 weeks 11/20/19)    PT Home Exercise Plan  see pt instructions    Consulted and Agree with Plan of Care  Patient       Patient will benefit from skilled therapeutic intervention in order to improve the following deficits and impairments:  Pain, Postural dysfunction, Impaired UE functional use, Decreased strength, Decreased range of  motion, Increased edema  Visit Diagnosis: Stiffness of right shoulder, not elsewhere classified  Acute pain of right shoulder     Problem List Patient Active Problem List   Diagnosis Date Noted  . Cervical disc herniation 01/31/2019  . Stomatitis 07/03/2018  . TMJ (sprain of temporomandibular joint), initial encounter 07/03/2018  . Tremor, essential 03/20/2017  . Syncope and collapse 03/20/2017  . AF (atrial fibrillation) ([place]) 08/03/2016  . Hypokalemia 02/25/2016  . Anxiety 02/24/2016  . Stroke-like symptoms 02/24/2016  . Weakness 01/26/2016  . Atrial fibrillation ([place]) 01/26/2016  . History of colon polyps 01/28/2015  . Dysphagia 01/28/2015  . History of esophageal stricture 01/28/2015  . Primary osteoarthritis of right knee 06/24/2014  . Acute systolic heart failure ([place]) 06/23/2014  . Takotsubo syndrome 06/21/2014  . Elevated troponin 06/20/2014  . Atrial fibrillation with RVR ([place]) 06/20/2014  . Congestive dilated cardiomyopathy ([place]) 06/20/2014  . Demand ischemia of myocardium ([place]) 06/20/2014  . 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 Respiratory failure requiring intubation ([place]) 06/19/2014  . Acute pulmonary edema ([place]) 06/19/2014  . Acute respiratory failure with hypoxia ([place]) 06/19/2014  . Hypoxemia   . Arterial hypotension   . Gait instability 10/26/2013  . Stroke ([place]) 10/26/2013  . TIA (transient ischemic attack) 10/26/2013  . Tobacco user 05/20/2013  . Postoperative anemia due to acute blood loss 01/18/2013  . Osteoarthritis of left knee 01/17/2013  . Essential hypertension 12/07/2012  .  Peripheral edema 12/07/2012  . Diabetes mellitus with complication ([place]) 99/67/2277  . COPD mixed type ([place]) 08/05/2012  . Seasonal and perennial allergic rhinitis 08/05/2012  . Urticaria 08/05/2012  . Chest pain 02/06/2012  . Hyperlipidemia 02/06/2012    Blayden Conwell, [place] MPT 11/26/2019, 5:48 PM  [facility] [address] [place], [place], [address] Phone: [phone]   Fax:  [phone]  Name: [name] [name] MRN: [id number] Date of Birth: August 29, 1950

## 2019-11-28 ENCOUNTER — Encounter: Payer: Self-pay | Admitting: Physical Therapy

## 2019-11-28 ENCOUNTER — Ambulatory Visit: Payer: Medicare Other | Admitting: Physical Therapy

## 2019-11-28 ENCOUNTER — Other Ambulatory Visit: Payer: Self-pay

## 2019-11-28 DIAGNOSIS — M25611 Stiffness of right shoulder, not elsewhere classified: Secondary | ICD-10-CM

## 2019-11-28 DIAGNOSIS — M6281 Muscle weakness (generalized): Secondary | ICD-10-CM

## 2019-11-28 DIAGNOSIS — M25511 Pain in right shoulder: Secondary | ICD-10-CM

## 2019-11-28 NOTE — Therapy (Signed)
[name] [name], [place], [address] Phone: [phone]   Fax:  [phone]  Physical Therapy Treatment  Patient Details  Name: [name] [name] MRN: [id number] Date of Birth: [birth year] Referring Provider (PT): Dr. Justice Britain   Encounter Date: 11/28/2019  PT End of Session - 11/28/19 1602    Visit Number  10    Number of Visits  24    Date for PT Re-Evaluation  12/27/19    PT Start Time  0320    Activity Tolerance  Patient tolerated treatment well    Behavior During Therapy  [facility] for tasks assessed/performed       Past Medical History:  Diagnosis Date  . Anemia    hx of years ago   . Anxiety   . Aortic valve disorders   . Arthritis   . Asthma   . Barrett esophagus   . Bipolar disorder ([place])   . CHF (congestive heart failure) ([place])   . Chronic bronchitis ([place])   . Chronic kidney disease    "I don't have but 1" (08/03/2016)  . Complication of anesthesia   . COPD (chronic obstructive pulmonary disease) ([place])    "just have a touch" (08/03/2016)  . Depression   . Esophageal reflux   . Esophageal stricture   . Family history of adverse reaction to anesthesia    brother had nausea and vomiting, mother has had nausea  . Fibromyalgia    "hands, knees, elbows; back; think it's in my hips" (08/03/2016)  . GAD (generalized anxiety disorder)   . GERD (gastroesophageal reflux disease)   . Hiatal hernia   . History of blood transfusion 1970s; 01/08/2013   "low blood count"  . History of kidney stones 01-08-13   past hx.  . History of nuclear stress test 01/2016   low risk study  . History of stomach ulcers    "some have bleed"  . Hyperlipemia   . Hypertension 12/07/2012  . Insomnia, unspecified   . Myocardial infarction [facility]) 01-08-13   '06-Chest pain-no stent-dx. MI-stress related  . Osteoporosis   . Personal history of colonic polyps 09/2010   TUBULAR ADENOMAS (X3); NEGATIVE FOR HIGH GRADE DYSPLASIA OR  MALIGNANCY.  [name] Pneumonia    "several times" (08/03/2016)  . PONV (postoperative nausea and vomiting)   . Stroke [facility])    "2 or 3"; remains with some right sided weaknes (08/03/2016)  . Takotsubo syndrome   . Tremor, essential 03/20/2017  . Type II diabetes mellitus ([place])   . Urinary frequency    AND INCONTINENCE    Past Surgical History:  Procedure Laterality Date  . ANTERIOR CERVICAL DECOMP/DISCECTOMY FUSION N/A 01/31/2019   Procedure: ANTERIOR CERVICAL DECOMPRESSION/DISCECTOMY FUSION C5-6;  Surgeon: Melina Schools, MD;  Location: [place];  Service: Orthopedics;  Laterality: N/A;  3 hrs  . ATRIAL FIBRILLATION ABLATION  08/03/2016  . CARDIAC CATHETERIZATION     normal per Dr Acie Fredrickson in note dated 02/06/12   . CARDIAC CATHETERIZATION     "I've had 2 or 3" (08/03/2016)  . CARPAL TUNNEL RELEASE Right   . CATARACT EXTRACTION W/ INTRAOCULAR LENS  IMPLANT, BILATERAL    . COLONOSCOPY W/ BIOPSIES AND POLYPECTOMY    . DILATION AND CURETTAGE OF UTERUS     S/P miscarriage  . ELECTROPHYSIOLOGIC STUDY N/A 08/03/2016   Procedure: Atrial Fibrillation Ablation;  Surgeon: Will Meredith Leeds, MD;  Location: [place] [facility];  Service: Cardiovascular;  Laterality: N/A;  . ESOPHAGEAL DILATION  01-08-13   2 yrs ago  . HEEL SPUR SURGERY Left 2005  . KNEE ARTHROSCOPY  02/28/2012   Procedure: ARTHROSCOPY KNEE;  Surgeon: Tobi Bastos, MD;  [facility];  Service: Orthopedics;  Laterality: Left;  . LAPAROSCOPIC CHOLECYSTECTOMY  03/2002  . REVERSE SHOULDER ARTHROPLASTY Right 10/10/2019   Procedure: REVERSE SHOULDER ARTHROPLASTY;  Surgeon: Justice Britain, MD;  [facility];  Service: Orthopedics;  Laterality: Right;  174mn  . SHOULDER OPEN ROTATOR CUFF REPAIR Right 2011  . TONSILLECTOMY AND ADENOIDECTOMY  1965  . TOTAL KNEE ARTHROPLASTY Left 01/17/2013   Procedure: LEFT TOTAL KNEE ARTHROPLASTY;  Surgeon: RTobi Bastos MD;  [facility];  Service: Orthopedics;  Laterality: Left;  . TOTAL KNEE  ARTHROPLASTY Right 06/19/2014   Procedure: RIGHT TOTAL KNEE ARTHROPLASTY;  Surgeon: RTobi Bastos MD;  [facility];  Service: Orthopedics;  Laterality: Right;  . TUBAL LIGATION    . VAGINAL HYSTERECTOMY     "partial"  . WRIST FLEXION TENDON TENOTOMIES AND PROXIMAL CORPECTOMY W/ WRIST ARTHRODESIS&ILIAC CREST BONE GRAFT      There were no vitals filed for this visit.  Subjective Assessment - 11/28/19 1525    Subjective  COVID-19 screen performed prior to patient entering clinic.  No new complaints.    Pertinent History  R reverse shoulder arthroplasty 10/10/2019    Patient Stated Goals  "Be able to use my arm again"                       [facility] - 11/28/19 0001      Exercises   Exercises  Shoulder      Shoulder Exercises: Supine   Other Supine Exercises  In supine:  AAROM into flexion, IR?ER and rhy stabs x 10 minutes.      Shoulder Exercises: Pulleys   Flexion  5 minutes    Other Pulley Exercises  Wall ladder x 5 minutes f/b ball on the wall x 3 minutes.    Other Pulley Exercises  UE Ranger on wall x 4 minutes.      Vasopneumatic   Number Minutes Vasopneumatic   15 minutes    Vasopnuematic Location   --   Right shoulder.   Vasopneumatic Pressure  Low                  PT Long Term Goals - 11/21/19 1534      PT LONG TERM GOAL #1   Title  Independent with a HEP.    Time  6    Period  Weeks    Status  On-going      PT LONG TERM GOAL #2   Title  pt will improve her R shoulder flexion to >/= 115 degrees.    Time  8    Period  Weeks    Status  On-going      PT LONG TERM GOAL #3   Title  Pt will report improved sleep by 50%.    Baseline  MET 11/21/19    Time  6    Period  Weeks    Status  Achieved   improved more than 50% 11/21/19     PT LONG TERM GOAL #4   Title  Pt will be able to report pain </= 2/10 with basic ADL's within protocol guidelines.    Baseline  6/10 at rest, unable to perform ADL's without  assistance    Time  12  Period  Weeks    Status  On-going      PT LONG TERM GOAL #5   Title  pt will improve AROM to >/= 150 degrees flexion and ER to >/= 60 degrees.    Baseline  see flow sheet    Time  12    Period  Weeks    Status  On-going            Plan - 11/28/19 1558    Clinical Impression Statement  Patient very pleased with her progress.  She is is doing very well per protocol.  She states her shoulder "grabs" sometimes.    Personal Factors and Comorbidities  Comorbidity 3+    Comorbidities  DM, CHF, bipolar CKD, anxiety, depression, barrett esophagus, COPD, L TKA, L knee arthroscopy, cardica cath, wrist flexion tendenodesis    Examination-Activity Limitations  Carry;Dressing;Sleep;Reach Overhead;Lift    Examination-Participation Restrictions  Cleaning;Community Activity;Driving;Laundry;Other    Stability/Clinical Decision Making  Stable/Uncomplicated    Rehab Potential  Good    PT Frequency  2x / week    PT Duration  12 weeks    PT Treatment/Interventions  ADLs/Self Care Home Management;Cryotherapy;Electrical Stimulation;Moist Heat;Ultrasound;Functional mobility training;Stair training;Therapeutic activities;Therapeutic exercise;Neuromuscular re-education;Manual techniques;Taping;Vasopneumatic Device    PT Next Visit Plan  cont with POC for PROM, pendulums, grip strengthening, E-stim, Vaso (5 weeks 11/20/19)    PT Home Exercise Plan  see pt instructions       Patient will benefit from skilled therapeutic intervention in order to improve the following deficits and impairments:  Pain, Postural dysfunction, Impaired UE functional use, Decreased strength, Decreased range of motion, Increased edema  Visit Diagnosis: Stiffness of right shoulder, not elsewhere classified  Acute pain of right shoulder  Muscle weakness (generalized)     Problem List Patient Active Problem List   Diagnosis Date Noted  . Cervical disc herniation 01/31/2019  . Stomatitis 07/03/2018   . TMJ (sprain of temporomandibular joint), initial encounter 07/03/2018  . Tremor, essential 03/20/2017  . Syncope and collapse 03/20/2017  . AF (atrial fibrillation) ([place]) 08/03/2016  . Hypokalemia 02/25/2016  . Anxiety 02/24/2016  . Stroke-like symptoms 02/24/2016  . Weakness 01/26/2016  . Atrial fibrillation ([place]) 01/26/2016  . History of colon polyps 01/28/2015  . Dysphagia 01/28/2015  . History of esophageal stricture 01/28/2015  . Primary osteoarthritis of right knee 06/24/2014  . Acute systolic heart failure ([place]) 06/23/2014  . Takotsubo syndrome 06/21/2014  . Elevated troponin 06/20/2014  . Atrial fibrillation with RVR ([place]) 06/20/2014  . Congestive dilated cardiomyopathy ([place]) 06/20/2014  . Demand ischemia of myocardium ([place]) 06/20/2014  . 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 Respiratory failure requiring intubation ([place]) 06/19/2014  . Acute pulmonary edema ([place]) 06/19/2014  . Acute respiratory failure with hypoxia ([place]) 06/19/2014  . Hypoxemia   . Arterial hypotension   . Gait instability 10/26/2013  . Stroke ([place]) 10/26/2013  . TIA (transient ischemic attack) 10/26/2013  . Tobacco user 05/20/2013  . Postoperative anemia due to acute blood loss 01/18/2013  . Osteoarthritis of left knee 01/17/2013  . Essential hypertension 12/07/2012  . Peripheral edema 12/07/2012  . Diabetes mellitus with complication ([place]) 57/26/2035  . COPD mixed type ([place]) 08/05/2012  . Seasonal and perennial allergic rhinitis 08/05/2012  . Urticaria 08/05/2012  . Chest pain 02/06/2012  . Hyperlipidemia 02/06/2012   Progress Note Reporting Period 10/28/19 to 11/28/19  See note below for Objective Data and Assessment of Progress/Goals. Patient progressing very well per protocol guidelines.  Matheau Orona, [place] MPT 11/28/2019, 4:15 PM  [facility] [address] [place], [place], [address] Phone: [phone]   Fax:   [phone]  Name: [name] [name] MRN: [id number] Date of Birth: Jul 01, 1951

## 2019-12-03 ENCOUNTER — Ambulatory Visit: Payer: Medicare Other | Attending: Orthopedic Surgery | Admitting: Physical Therapy

## 2019-12-03 ENCOUNTER — Other Ambulatory Visit: Payer: Self-pay

## 2019-12-03 DIAGNOSIS — R600 Localized edema: Secondary | ICD-10-CM | POA: Insufficient documentation

## 2019-12-03 DIAGNOSIS — R293 Abnormal posture: Secondary | ICD-10-CM | POA: Diagnosis present

## 2019-12-03 DIAGNOSIS — M25511 Pain in right shoulder: Secondary | ICD-10-CM | POA: Diagnosis present

## 2019-12-03 DIAGNOSIS — M25611 Stiffness of right shoulder, not elsewhere classified: Secondary | ICD-10-CM | POA: Diagnosis not present

## 2019-12-03 DIAGNOSIS — M542 Cervicalgia: Secondary | ICD-10-CM | POA: Insufficient documentation

## 2019-12-03 DIAGNOSIS — M6281 Muscle weakness (generalized): Secondary | ICD-10-CM | POA: Insufficient documentation

## 2019-12-03 NOTE — Therapy (Signed)
[facility] [place], [place], [address] Phone: [phone]   Fax:  [phone]  Physical Therapy Treatment  Patient Details  Name: [name] [name] MRN: [id number] Date of Birth: June 08, 1951 Referring Provider (PT): Dr. Justice Britain   Encounter Date: 12/03/2019  PT End of Session - 12/03/19 1227    Visit Number  11    Number of Visits  24    Date for PT Re-Evaluation  12/27/19    PT Start Time  1115    PT Stop Time  1205    PT Time Calculation (min)  50 min    Activity Tolerance  Patient tolerated treatment well    Behavior During Therapy  [facility] for tasks assessed/performed       Past Medical History:  Diagnosis Date  . Anemia    hx of years ago   . Anxiety   . Aortic valve disorders   . Arthritis   . Asthma   . Barrett esophagus   . Bipolar disorder ([place])   . CHF (congestive heart failure) ([place])   . Chronic bronchitis ([place])   . Chronic kidney disease    "I don't have but 1" (08/03/2016)  . Complication of anesthesia   . COPD (chronic obstructive pulmonary disease) ([place])    "just have a touch" (08/03/2016)  . Depression   . Esophageal reflux   . Esophageal stricture   . Family history of adverse reaction to anesthesia    brother had nausea and vomiting, mother has had nausea  . Fibromyalgia    "hands, knees, elbows; back; think it's in my hips" (08/03/2016)  . GAD (generalized anxiety disorder)   . GERD (gastroesophageal reflux disease)   . Hiatal hernia   . History of blood transfusion 1970s; 01/08/2013   "low blood count"  . History of kidney stones 01-08-13   past hx.  . History of nuclear stress test 01/2016   low risk study  . History of stomach ulcers    "some have bleed"  . Hyperlipemia   . Hypertension 12/07/2012  . Insomnia, unspecified   . Myocardial infarction [facility]) 01-08-13   '06-Chest pain-no stent-dx. MI-stress related  . Osteoporosis   . Personal history of colonic polyps 09/2010   TUBULAR  ADENOMAS (X3); NEGATIVE FOR HIGH GRADE DYSPLASIA OR MALIGNANCY.  [name] Pneumonia    "several times" (08/03/2016)  . PONV (postoperative nausea and vomiting)   . Stroke [facility])    "2 or 3"; remains with some right sided weaknes (08/03/2016)  . Takotsubo syndrome   . Tremor, essential 03/20/2017  . Type II diabetes mellitus ([place])   . Urinary frequency    AND INCONTINENCE    Past Surgical History:  Procedure Laterality Date  . ANTERIOR CERVICAL DECOMP/DISCECTOMY FUSION N/A 01/31/2019   Procedure: ANTERIOR CERVICAL DECOMPRESSION/DISCECTOMY FUSION C5-6;  Surgeon: Melina Schools, MD;  Location: [place];  Service: Orthopedics;  Laterality: N/A;  3 hrs  . ATRIAL FIBRILLATION ABLATION  08/03/2016  . CARDIAC CATHETERIZATION     normal per Dr Acie Fredrickson in note dated 02/06/12   . CARDIAC CATHETERIZATION     "I've had 2 or 3" (08/03/2016)  . CARPAL TUNNEL RELEASE Right   . CATARACT EXTRACTION W/ INTRAOCULAR LENS  IMPLANT, BILATERAL    . COLONOSCOPY W/ BIOPSIES AND POLYPECTOMY    . DILATION AND CURETTAGE OF UTERUS     S/P miscarriage  . ELECTROPHYSIOLOGIC STUDY N/A 08/03/2016   Procedure: Atrial Fibrillation Ablation;  Surgeon: [employer],  MD;  Location: [place] [facility];  Service: Cardiovascular;  Laterality: N/A;  . ESOPHAGEAL DILATION  01-08-13   2 yrs ago  . HEEL SPUR SURGERY Left 2005  . KNEE ARTHROSCOPY  02/28/2012   Procedure: ARTHROSCOPY KNEE;  Surgeon: Tobi Bastos, MD;  [facility];  Service: Orthopedics;  Laterality: Left;  . LAPAROSCOPIC CHOLECYSTECTOMY  03/2002  . REVERSE SHOULDER ARTHROPLASTY Right 10/10/2019   Procedure: REVERSE SHOULDER ARTHROPLASTY;  Surgeon: Justice Britain, MD;  [facility];  Service: Orthopedics;  Laterality: Right;  171min  . SHOULDER OPEN ROTATOR CUFF REPAIR Right 2011  . TONSILLECTOMY AND ADENOIDECTOMY  1965  . TOTAL KNEE ARTHROPLASTY Left 01/17/2013   Procedure: LEFT TOTAL KNEE ARTHROPLASTY;  Surgeon: Tobi Bastos, MD;  [facility];  Service:  Orthopedics;  Laterality: Left;  . TOTAL KNEE ARTHROPLASTY Right 06/19/2014   Procedure: RIGHT TOTAL KNEE ARTHROPLASTY;  Surgeon: Tobi Bastos, MD;  [facility];  Service: Orthopedics;  Laterality: Right;  . TUBAL LIGATION    . VAGINAL HYSTERECTOMY     "partial"  . WRIST FLEXION TENDON TENOTOMIES AND PROXIMAL CORPECTOMY W/ WRIST ARTHRODESIS&ILIAC CREST BONE GRAFT      There were no vitals filed for this visit.  Subjective Assessment - 12/03/19 1226    Subjective  COVID-19 screen performed prior to patient entering clinic. Pt reporting 3/10 pain upon arrival.    Pertinent History  R reverse shoulder arthroplasty 10/10/2019    Patient Stated Goals  "Be able to use my arm again"    Currently in Pain?  Yes    Pain Score  3     Pain Location  Shoulder    Pain Orientation  Right    Pain Descriptors / Indicators  Sore    Pain Onset  More than a month ago         [facility] PT Assessment - 12/03/19 0001      ROM / Strength   AROM / PROM / Strength  PROM      PROM   Right Shoulder Flexion  140 Degrees    Right Shoulder External Rotation  70 Degrees                   [facility] Adult PT Treatment/Exercise - 12/03/19 0001      Exercises   Exercises  Shoulder      Shoulder Exercises: Supine   External Rotation  AAROM;Right;15 reps    Flexion  AAROM;Both;15 reps    Other Supine Exercises  serratus punches x 15 reps       Shoulder Exercises: Pulleys   Flexion  5 minutes    Other Pulley Exercises  Wall ladder x 3 minutes, reaching #21    Other Pulley Exercises  UE ranger standing      Vasopneumatic   Number Minutes Vasopneumatic   10 minutes    Vasopneumatic Pressure  Low             PT Education - 12/03/19 1226    Education Details  HEP    Person(s) Educated  Patient    Methods  Explanation;Demonstration    Comprehension  Verbalized understanding;Returned demonstration          PT Long Term Goals - 12/03/19 1229      PT LONG TERM GOAL #1   Title   Independent with a HEP.    Status  On-going      PT LONG TERM GOAL #2   Title  pt will improve her R shoulder flexion to >/= 115 degrees.    Status  On-going      PT LONG TERM GOAL #3   Title  Pt will report improved sleep by 50%.    Status  Achieved      PT LONG TERM GOAL #4   Title  Pt will be able to report pain </= 2/10 with basic ADL's within protocol guidelines.    Status  On-going      PT LONG TERM GOAL #5   Title  pt will improve AROM to >/= 150 degrees flexion and ER to >/= 60 degrees.    Status  On-going            Plan - 12/03/19 1227    Clinical Impression Statement  Pt tolreating treatment well today making progress with PROM R flexion (140 degrees) and ER (70 degrees). Pt reporting soreness remaining throughout session. Vasopneumatic applied to R shoulder at end of session. Continue to progress toward pt's LTG's following pt's protocol.    Personal Factors and Comorbidities  Comorbidity 3+    Comorbidities  DM, CHF, bipolar CKD, anxiety, depression, barrett esophagus, COPD, L TKA, L knee arthroscopy, cardica cath, wrist flexion tendenodesis    Examination-Participation Restrictions  Cleaning;Community Activity;Driving;Laundry;Other    Stability/Clinical Decision Making  Stable/Uncomplicated    Rehab Potential  Good    PT Frequency  2x / week    PT Duration  12 weeks    PT Treatment/Interventions  ADLs/Self Care Home Management;Cryotherapy;Electrical Stimulation;Moist Heat;Ultrasound;Functional mobility training;Stair training;Therapeutic activities;Therapeutic exercise;Neuromuscular re-education;Manual techniques;Taping;Vasopneumatic Device    PT Next Visit Plan  cont with POC for PROM, pendulums, grip strengthening, E-stim, Vaso (5 weeks 11/20/19)    PT Home Exercise Plan  see pt instructions    Consulted and Agree with Plan of Care  Patient       Patient will benefit from skilled therapeutic intervention in order to improve the following deficits and  impairments:  Pain, Postural dysfunction, Impaired UE functional use, Decreased strength, Decreased range of motion, Increased edema  Visit Diagnosis: Stiffness of right shoulder, not elsewhere classified  Acute pain of right shoulder  Muscle weakness (generalized)  Localized edema  Abnormal posture  Cervicalgia     Problem List Patient Active Problem List   Diagnosis Date Noted  . Cervical disc herniation 01/31/2019  . Stomatitis 07/03/2018  . TMJ (sprain of temporomandibular joint), initial encounter 07/03/2018  . Tremor, essential 03/20/2017  . Syncope and collapse 03/20/2017  . AF (atrial fibrillation) ([place]) 08/03/2016  . Hypokalemia 02/25/2016  . Anxiety 02/24/2016  . Stroke-like symptoms 02/24/2016  . Weakness 01/26/2016  . Atrial fibrillation ([place]) 01/26/2016  . History of colon polyps 01/28/2015  . Dysphagia 01/28/2015  . History of esophageal stricture 01/28/2015  . Primary osteoarthritis of right knee 06/24/2014  . Acute systolic heart failure ([place]) 06/23/2014  . Takotsubo syndrome 06/21/2014  . Elevated troponin 06/20/2014  . Atrial fibrillation with RVR ([place]) 06/20/2014  . Congestive dilated cardiomyopathy ([place]) 06/20/2014  . Demand ischemia of myocardium ([place]) 06/20/2014  . 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 Respiratory failure requiring intubation ([place]) 06/19/2014  . Acute pulmonary edema ([place]) 06/19/2014  . Acute respiratory failure with hypoxia ([place]) 06/19/2014  . Hypoxemia   . Arterial hypotension   . Gait instability 10/26/2013  . Stroke ([place]) 10/26/2013  . TIA (transient ischemic attack) 10/26/2013  . Tobacco user 05/20/2013  . Postoperative anemia due to acute blood loss 01/18/2013  .  Osteoarthritis of left knee 01/17/2013  . Essential hypertension 12/07/2012  . Peripheral edema 12/07/2012  . Diabetes mellitus with complication ([place]) Q000111Q  . COPD mixed type ([place]) 08/05/2012  . Seasonal and perennial allergic  rhinitis 08/05/2012  . Urticaria 08/05/2012  . Chest pain 02/06/2012  . Hyperlipidemia 02/06/2012    Oretha Caprice, PT, MPT 12/03/2019, 12:31 PM  [facility] [place], [place], [address] Phone: [phone]   Fax:  [phone]  Name: [name] [name] MRN: [id number] Date of Birth: [birth year]

## 2019-12-05 ENCOUNTER — Ambulatory Visit: Payer: Medicare Other | Admitting: Physical Therapy

## 2019-12-05 ENCOUNTER — Other Ambulatory Visit: Payer: Self-pay

## 2019-12-05 DIAGNOSIS — M25611 Stiffness of right shoulder, not elsewhere classified: Secondary | ICD-10-CM | POA: Diagnosis not present

## 2019-12-05 DIAGNOSIS — M25511 Pain in right shoulder: Secondary | ICD-10-CM

## 2019-12-05 NOTE — Therapy (Signed)
[facility] [place], [place], [address] Phone: [phone]   Fax:  [phone]  Physical Therapy Evaluation  Patient Details  Name: [name] [name] MRN: [id number] Date of Birth: 1950/08/12 Referring Provider (PT): Dr. Justice Britain   Encounter Date: 12/05/2019  PT End of Session - 12/05/19 1534    Visit Number  12    Number of Visits  24    Date for PT Re-Evaluation  12/27/19    PT Start Time  0322    PT Stop Time  0411    PT Time Calculation (min)  49 min       Past Medical History:  Diagnosis Date  . Anemia    hx of years ago   . Anxiety   . Aortic valve disorders   . Arthritis   . Asthma   . Barrett esophagus   . Bipolar disorder ([place])   . CHF (congestive heart failure) ([place])   . Chronic bronchitis ([place])   . Chronic kidney disease    "I don't have but 1" (08/03/2016)  . Complication of anesthesia   . COPD (chronic obstructive pulmonary disease) ([place])    "just have a touch" (08/03/2016)  . Depression   . Esophageal reflux   . Esophageal stricture   . Family history of adverse reaction to anesthesia    brother had nausea and vomiting, mother has had nausea  . Fibromyalgia    "hands, knees, elbows; back; think it's in my hips" (08/03/2016)  . GAD (generalized anxiety disorder)   . GERD (gastroesophageal reflux disease)   . Hiatal hernia   . History of blood transfusion 1970s; 01/08/2013   "low blood count"  . History of kidney stones 01-08-13   past hx.  . History of nuclear stress test 01/2016   low risk study  . History of stomach ulcers    "some have bleed"  . Hyperlipemia   . Hypertension 12/07/2012  . Insomnia, unspecified   . Myocardial infarction [facility]) 01-08-13   '06-Chest pain-no stent-dx. MI-stress related  . Osteoporosis   . Personal history of colonic polyps 09/2010   TUBULAR ADENOMAS (X3); NEGATIVE FOR HIGH GRADE DYSPLASIA OR MALIGNANCY.  [name] Pneumonia    "several times" (08/03/2016)  . PONV  (postoperative nausea and vomiting)   . Stroke [facility])    "2 or 3"; remains with some right sided weaknes (08/03/2016)  . Takotsubo syndrome   . Tremor, essential 03/20/2017  . Type II diabetes mellitus ([place])   . Urinary frequency    AND INCONTINENCE    Past Surgical History:  Procedure Laterality Date  . ANTERIOR CERVICAL DECOMP/DISCECTOMY FUSION N/A 01/31/2019   Procedure: ANTERIOR CERVICAL DECOMPRESSION/DISCECTOMY FUSION C5-6;  Surgeon: Melina Schools, MD;  Location: [place];  Service: Orthopedics;  Laterality: N/A;  3 hrs  . ATRIAL FIBRILLATION ABLATION  08/03/2016  . CARDIAC CATHETERIZATION     normal per Dr Acie Fredrickson in note dated 02/06/12   . CARDIAC CATHETERIZATION     "I've had 2 or 3" (08/03/2016)  . CARPAL TUNNEL RELEASE Right   . CATARACT EXTRACTION W/ INTRAOCULAR LENS  IMPLANT, BILATERAL    . COLONOSCOPY W/ BIOPSIES AND POLYPECTOMY    . DILATION AND CURETTAGE OF UTERUS     S/P miscarriage  . ELECTROPHYSIOLOGIC STUDY N/A 08/03/2016   Procedure: Atrial Fibrillation Ablation;  Surgeon: Will Meredith Leeds, MD;  Location: [place] [facility];  Service: Cardiovascular;  Laterality: N/A;  . ESOPHAGEAL DILATION  01-08-13  2 yrs ago  . HEEL SPUR SURGERY Left 2005  . KNEE ARTHROSCOPY  02/28/2012   Procedure: ARTHROSCOPY KNEE;  Surgeon: Tobi Bastos, MD;  [facility];  Service: Orthopedics;  Laterality: Left;  . LAPAROSCOPIC CHOLECYSTECTOMY  03/2002  . REVERSE SHOULDER ARTHROPLASTY Right 10/10/2019   Procedure: REVERSE SHOULDER ARTHROPLASTY;  Surgeon: Justice Britain, MD;  [facility];  Service: Orthopedics;  Laterality: Right;  124min  . SHOULDER OPEN ROTATOR CUFF REPAIR Right 2011  . TONSILLECTOMY AND ADENOIDECTOMY  1965  . TOTAL KNEE ARTHROPLASTY Left 01/17/2013   Procedure: LEFT TOTAL KNEE ARTHROPLASTY;  Surgeon: Tobi Bastos, MD;  [facility];  Service: Orthopedics;  Laterality: Left;  . TOTAL KNEE ARTHROPLASTY Right 06/19/2014   Procedure: RIGHT TOTAL KNEE ARTHROPLASTY;   Surgeon: Tobi Bastos, MD;  [facility];  Service: Orthopedics;  Laterality: Right;  . TUBAL LIGATION    . VAGINAL HYSTERECTOMY     "partial"  . WRIST FLEXION TENDON TENOTOMIES AND PROXIMAL CORPECTOMY W/ WRIST ARTHRODESIS&ILIAC CREST BONE GRAFT      There were no vitals filed for this visit.   Subjective Assessment - 12/05/19 1535    Subjective  COVID-19 screen performed prior to patient entering clinic.  Pain a 3.  Working hard at home.    Pertinent History  R reverse shoulder arthroplasty 10/10/2019    Patient Stated Goals  "Be able to use my arm again"    Currently in Pain?  Yes    Pain Score  3     Pain Location  Shoulder    Pain Orientation  Right    Pain Descriptors / Indicators  Sore    Pain Type  Surgical pain    Pain Onset  More than a month ago                    Objective measurements completed on examination: See above findings.      [facility] Adult PT Treatment/Exercise - 12/05/19 0001      Exercises   Exercises  Shoulder      Shoulder Exercises: Supine   Other Supine Exercises  On incline:  AAROM into flexion and rhy stabs multiple reps to fatigue.      Shoulder Exercises: Standing   Other Standing Exercises  Yellow theraband IR/ER minimal range      Shoulder Exercises: Pulleys   Flexion  5 minutes    Other Pulley Exercises  Wall ladder x 5 minutes f/b seated UE Ranger x 5 minutes.      Modalities   Modalities  Psychologist, educational Location  Right ACJ region.    Electrical Stimulation Action  Pre-mod.    Electrical Stimulation Parameters  80-150 Hz x 15 minutes.    Electrical Stimulation Goals  Pain      Vasopneumatic   Number Minutes Vasopneumatic   15 minutes    Vasopnuematic Location   --   Right shoulder.   Vasopneumatic Pressure  Low                  PT Long Term Goals - 12/03/19 1229      PT LONG TERM GOAL #1   Title  Independent with a  HEP.    Status  On-going      PT LONG TERM GOAL #2   Title  pt will improve her R shoulder flexion to >/= 115 degrees.  Status  On-going      PT LONG TERM GOAL #3   Title  Pt will report improved sleep by 50%.    Status  Achieved      PT LONG TERM GOAL #4   Title  Pt will be able to report pain </= 2/10 with basic ADL's within protocol guidelines.    Status  On-going      PT LONG TERM GOAL #5   Title  pt will improve AROM to >/= 150 degrees flexion and ER to >/= 60 degrees.    Status  On-going             Plan - 12/05/19 1650    Clinical Impression Statement  Patient is progressing very well but reports a "grabbing" in a certain point in her range of motion.  Per palpation she was fould to be tender to palpation in the region of her right ACJ.    Personal Factors and Comorbidities  Comorbidity 3+    Comorbidities  DM, CHF, bipolar CKD, anxiety, depression, barrett esophagus, COPD, L TKA, L knee arthroscopy, cardica cath, wrist flexion tendenodesis    Examination-Activity Limitations  Carry;Dressing;Sleep;Reach Overhead;Lift    Examination-Participation Restrictions  Cleaning;Community Activity;Driving;Laundry;Other    Stability/Clinical Decision Making  Stable/Uncomplicated    Rehab Potential  Good    PT Frequency  2x / week    PT Duration  12 weeks    PT Treatment/Interventions  ADLs/Self Care Home Management;Cryotherapy;Electrical Stimulation;Moist Heat;Ultrasound;Functional mobility training;Stair training;Therapeutic activities;Therapeutic exercise;Neuromuscular re-education;Manual techniques;Taping;Vasopneumatic Device;Iontophoresis 4mg /ml Dexamethasone    PT Next Visit Plan  Begin Iontophoresis when cert is signed.    PT Home Exercise Plan  see pt instructions    Consulted and Agree with Plan of Care  Patient       Patient will benefit from skilled therapeutic intervention in order to improve the following deficits and impairments:  Pain, Postural dysfunction,  Impaired UE functional use, Decreased strength, Decreased range of motion, Increased edema  Visit Diagnosis: Stiffness of right shoulder, not elsewhere classified - Plan: PT plan of care cert/re-cert  Acute pain of right shoulder - Plan: PT plan of care cert/re-cert     Problem List Patient Active Problem List   Diagnosis Date Noted  . Cervical disc herniation 01/31/2019  . Stomatitis 07/03/2018  . TMJ (sprain of temporomandibular joint), initial encounter 07/03/2018  . Tremor, essential 03/20/2017  . Syncope and collapse 03/20/2017  . AF (atrial fibrillation) ([place]) 08/03/2016  . Hypokalemia 02/25/2016  . Anxiety 02/24/2016  . Stroke-like symptoms 02/24/2016  . Weakness 01/26/2016  . Atrial fibrillation ([place]) 01/26/2016  . History of colon polyps 01/28/2015  . Dysphagia 01/28/2015  . History of esophageal stricture 01/28/2015  . Primary osteoarthritis of right knee 06/24/2014  . Acute systolic heart failure ([place]) 06/23/2014  . Takotsubo syndrome 06/21/2014  . Elevated troponin 06/20/2014  . Atrial fibrillation with RVR ([place]) 06/20/2014  . Congestive dilated cardiomyopathy ([place]) 06/20/2014  . Demand ischemia of myocardium ([place]) 06/20/2014  . 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 Respiratory failure requiring intubation ([place]) 06/19/2014  . Acute pulmonary edema ([place]) 06/19/2014  . Acute respiratory failure with hypoxia ([place]) 06/19/2014  . Hypoxemia   . Arterial hypotension   . Gait instability 10/26/2013  . Stroke ([place]) 10/26/2013  . TIA (transient ischemic attack) 10/26/2013  . Tobacco user 05/20/2013  . Postoperative anemia due to acute blood loss 01/18/2013  . Osteoarthritis of left knee 01/17/2013  . Essential hypertension 12/07/2012  .  Peripheral edema 12/07/2012  . Diabetes mellitus with complication ([place]) Q000111Q  . COPD mixed type ([employer]) 08/05/2012  . Seasonal and perennial allergic rhinitis 08/05/2012  . Urticaria 08/05/2012  . Chest pain  02/06/2012  . Hyperlipidemia 02/06/2012    Tanazia Achee, [place] MPT 12/05/2019, 4:55 PM  [facility] [address] [place], [place], [address] Phone: [phone]   Fax:  [phone]  Name: [name] [name] MRN: [id number] Date of Birth: 06-Nov-1950

## 2019-12-10 ENCOUNTER — Other Ambulatory Visit: Payer: Self-pay

## 2019-12-10 ENCOUNTER — Ambulatory Visit: Payer: Medicare Other | Admitting: Physical Therapy

## 2019-12-10 DIAGNOSIS — M25611 Stiffness of right shoulder, not elsewhere classified: Secondary | ICD-10-CM

## 2019-12-10 DIAGNOSIS — M25511 Pain in right shoulder: Secondary | ICD-10-CM

## 2019-12-10 NOTE — Therapy (Signed)
[facility] [place], [place], [address] Phone: [phone]   Fax:  [phone]  Physical Therapy Treatment  Patient Details  Name: [name] [name] MRN: [id number] Date of Birth: 11/20/1950 Referring Provider (PT): Dr. Justice Britain   Encounter Date: 12/10/2019  PT End of Session - 12/10/19 1518    Visit Number  13    Number of Visits  24    Date for PT Re-Evaluation  12/27/19    PT Start Time  0145    PT Stop Time  0236    PT Time Calculation (min)  51 min    Activity Tolerance  Patient tolerated treatment well    Behavior During Therapy  [facility] for tasks assessed/performed       Past Medical History:  Diagnosis Date  . Anemia    hx of years ago   . Anxiety   . Aortic valve disorders   . Arthritis   . Asthma   . Barrett esophagus   . Bipolar disorder ([place])   . CHF (congestive heart failure) ([place])   . Chronic bronchitis ([place])   . Chronic kidney disease    "I don't have but 1" (08/03/2016)  . Complication of anesthesia   . COPD (chronic obstructive pulmonary disease) ([place])    "just have a touch" (08/03/2016)  . Depression   . Esophageal reflux   . Esophageal stricture   . Family history of adverse reaction to anesthesia    brother had nausea and vomiting, mother has had nausea  . Fibromyalgia    "hands, knees, elbows; back; think it's in my hips" (08/03/2016)  . GAD (generalized anxiety disorder)   . GERD (gastroesophageal reflux disease)   . Hiatal hernia   . History of blood transfusion 1970s; 01/08/2013   "low blood count"  . History of kidney stones 01-08-13   past hx.  . History of nuclear stress test 01/2016   low risk study  . History of stomach ulcers    "some have bleed"  . Hyperlipemia   . Hypertension 12/07/2012  . Insomnia, unspecified   . Myocardial infarction [facility]) 01-08-13   '06-Chest pain-no stent-dx. MI-stress related  . Osteoporosis   . Personal history of colonic polyps 09/2010   TUBULAR  ADENOMAS (X3); NEGATIVE FOR HIGH GRADE DYSPLASIA OR MALIGNANCY.  [name] Pneumonia    "several times" (08/03/2016)  . PONV (postoperative nausea and vomiting)   . Stroke [facility])    "2 or 3"; remains with some right sided weaknes (08/03/2016)  . Takotsubo syndrome   . Tremor, essential 03/20/2017  . Type II diabetes mellitus ([place])   . Urinary frequency    AND INCONTINENCE    Past Surgical History:  Procedure Laterality Date  . ANTERIOR CERVICAL DECOMP/DISCECTOMY FUSION N/A 01/31/2019   Procedure: ANTERIOR CERVICAL DECOMPRESSION/DISCECTOMY FUSION C5-6;  Surgeon: Melina Schools, MD;  Location: [place];  Service: Orthopedics;  Laterality: N/A;  3 hrs  . ATRIAL FIBRILLATION ABLATION  08/03/2016  . CARDIAC CATHETERIZATION     normal per Dr Acie Fredrickson in note dated 02/06/12   . CARDIAC CATHETERIZATION     "I've had 2 or 3" (08/03/2016)  . CARPAL TUNNEL RELEASE Right   . CATARACT EXTRACTION W/ INTRAOCULAR LENS  IMPLANT, BILATERAL    . COLONOSCOPY W/ BIOPSIES AND POLYPECTOMY    . DILATION AND CURETTAGE OF UTERUS     S/P miscarriage  . ELECTROPHYSIOLOGIC STUDY N/A 08/03/2016   Procedure: Atrial Fibrillation Ablation;  Surgeon: [employer],  MD;  Location: [place] [facility];  Service: Cardiovascular;  Laterality: N/A;  . ESOPHAGEAL DILATION  01-08-13   2 yrs ago  . HEEL SPUR SURGERY Left 2005  . KNEE ARTHROSCOPY  02/28/2012   Procedure: ARTHROSCOPY KNEE;  Surgeon: Tobi Bastos, MD;  [facility];  Service: Orthopedics;  Laterality: Left;  . LAPAROSCOPIC CHOLECYSTECTOMY  03/2002  . REVERSE SHOULDER ARTHROPLASTY Right 10/10/2019   Procedure: REVERSE SHOULDER ARTHROPLASTY;  Surgeon: Justice Britain, MD;  [facility];  Service: Orthopedics;  Laterality: Right;  117min  . SHOULDER OPEN ROTATOR CUFF REPAIR Right 2011  . TONSILLECTOMY AND ADENOIDECTOMY  1965  . TOTAL KNEE ARTHROPLASTY Left 01/17/2013   Procedure: LEFT TOTAL KNEE ARTHROPLASTY;  Surgeon: Tobi Bastos, MD;  [facility];  Service:  Orthopedics;  Laterality: Left;  . TOTAL KNEE ARTHROPLASTY Right 06/19/2014   Procedure: RIGHT TOTAL KNEE ARTHROPLASTY;  Surgeon: Tobi Bastos, MD;  [facility];  Service: Orthopedics;  Laterality: Right;  . TUBAL LIGATION    . VAGINAL HYSTERECTOMY     "partial"  . WRIST FLEXION TENDON TENOTOMIES AND PROXIMAL CORPECTOMY W/ WRIST ARTHRODESIS&ILIAC CREST BONE GRAFT      There were no vitals filed for this visit.  Subjective Assessment - 12/10/19 1519    Subjective  COVID-19 screen performed prior to patient entering clinic.  Been spring cleaning and helping my Mother move.  Pain high.    Pertinent History  R reverse shoulder arthroplasty 10/10/2019    Patient Stated Goals  "Be able to use my arm again"    Currently in Pain?  Yes    Pain Score  8     Pain Location  Shoulder    Pain Descriptors / Indicators  Sore    Pain Type  Surgical pain    Pain Onset  More than a month ago                       [facility] Adult PT Treatment/Exercise - 12/10/19 0001      Shoulder Exercises: Pulleys   Flexion  5 minutes    Other Pulley Exercises  Seated UE ranger x 3 minutes.      Modalities   Modalities  Electrical Stimulation;Moist Heat      Moist Heat Therapy   Number Minutes Moist Heat  20 Minutes    Moist Heat Location  --   Right shoulder.     Acupuncturist Location  RT ACJ region.    Electrical Stimulation Action  pre-mod.    Electrical Stimulation Parameters  80-150 Hz x 20 minutes.    Electrical Stimulation Goals  Pain      Manual Therapy   Manual Therapy  Soft tissue mobilization    Soft tissue mobilization  In supine:  STW/M including gentle IASTM x 16 minutes to patient's affected right shoulder.                  PT Long Term Goals - 12/03/19 1229      PT LONG TERM GOAL #1   Title  Independent with a HEP.    Status  On-going      PT LONG TERM GOAL #2   Title  pt will improve her R shoulder flexion to >/=  115 degrees.    Status  On-going      PT LONG TERM GOAL #3   Title  Pt will report improved sleep by 50%.  Status  Achieved      PT LONG TERM GOAL #4   Title  Pt will be able to report pain </= 2/10 with basic ADL's within protocol guidelines.    Status  On-going      PT LONG TERM GOAL #5   Title  pt will improve AROM to >/= 150 degrees flexion and ER to >/= 60 degrees.    Status  On-going            Plan - 12/10/19 1525    Clinical Impression Statement  Patient has been doing very heavy work at home and is experiencing a pain increase.  She remains very tender to palption over her right SCJ.  She felt much better after treatment today.    Personal Factors and Comorbidities  Comorbidity 3+    Comorbidities  DM, CHF, bipolar CKD, anxiety, depression, barrett esophagus, COPD, L TKA, L knee arthroscopy, cardica cath, wrist flexion tendenodesis    Examination-Activity Limitations  Carry;Dressing;Sleep;Reach Overhead;Lift    Examination-Participation Restrictions  Cleaning;Community Activity;Driving;Laundry;Other    Stability/Clinical Decision Making  Stable/Uncomplicated    Rehab Potential  Good    PT Frequency  2x / week    PT Duration  12 weeks    PT Treatment/Interventions  ADLs/Self Care Home Management;Cryotherapy;Electrical Stimulation;Moist Heat;Ultrasound;Functional mobility training;Stair training;Therapeutic activities;Therapeutic exercise;Neuromuscular re-education;Manual techniques;Taping;Vasopneumatic Device;Iontophoresis 4mg /ml Dexamethasone    PT Next Visit Plan  Begin Iontophoresis when cert is signed.    PT Home Exercise Plan  see pt instructions    Consulted and Agree with Plan of Care  Patient       Patient will benefit from skilled therapeutic intervention in order to improve the following deficits and impairments:  Pain, Postural dysfunction, Impaired UE functional use, Decreased strength, Decreased range of motion, Increased edema  Visit  Diagnosis: Stiffness of right shoulder, not elsewhere classified  Acute pain of right shoulder     Problem List Patient Active Problem List   Diagnosis Date Noted  . Cervical disc herniation 01/31/2019  . Stomatitis 07/03/2018  . TMJ (sprain of temporomandibular joint), initial encounter 07/03/2018  . Tremor, essential 03/20/2017  . Syncope and collapse 03/20/2017  . AF (atrial fibrillation) ([place]) 08/03/2016  . Hypokalemia 02/25/2016  . Anxiety 02/24/2016  . Stroke-like symptoms 02/24/2016  . Weakness 01/26/2016  . Atrial fibrillation ([place]) 01/26/2016  . History of colon polyps 01/28/2015  . Dysphagia 01/28/2015  . History of esophageal stricture 01/28/2015  . Primary osteoarthritis of right knee 06/24/2014  . Acute systolic heart failure ([place]) 06/23/2014  . Takotsubo syndrome 06/21/2014  . Elevated troponin 06/20/2014  . Atrial fibrillation with RVR ([place]) 06/20/2014  . Congestive dilated cardiomyopathy ([place]) 06/20/2014  . Demand ischemia of myocardium ([place]) 06/20/2014  . 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 Respiratory failure requiring intubation ([place]) 06/19/2014  . Acute pulmonary edema ([place]) 06/19/2014  . Acute respiratory failure with hypoxia ([place]) 06/19/2014  . Hypoxemia   . Arterial hypotension   . Gait instability 10/26/2013  . Stroke ([place]) 10/26/2013  . TIA (transient ischemic attack) 10/26/2013  . Tobacco user 05/20/2013  . Postoperative anemia due to acute blood loss 01/18/2013  . Osteoarthritis of left knee 01/17/2013  . Essential hypertension 12/07/2012  . Peripheral edema 12/07/2012  . Diabetes mellitus with complication ([place]) [id number]  . COPD mixed type ([place]) 08/05/2012  . Seasonal and perennial allergic rhinitis 08/05/2012  . Urticaria 08/05/2012  . Chest pain 02/06/2012  . Hyperlipidemia 02/06/2012    Wafaa Deemer, [place]  MPT 12/10/2019, 3:33 PM  [facility] [place], [place], [address] Phone: [phone]   Fax:  [phone]  Name: [name] [name] MRN: [id number] Date of Birth: 1950-12-06

## 2019-12-12 ENCOUNTER — Encounter: Payer: Self-pay | Admitting: Physical Therapy

## 2019-12-12 ENCOUNTER — Ambulatory Visit: Payer: Medicare Other | Admitting: Physical Therapy

## 2019-12-12 ENCOUNTER — Other Ambulatory Visit: Payer: Self-pay

## 2019-12-12 DIAGNOSIS — M25611 Stiffness of right shoulder, not elsewhere classified: Secondary | ICD-10-CM

## 2019-12-12 DIAGNOSIS — M25511 Pain in right shoulder: Secondary | ICD-10-CM

## 2019-12-12 NOTE — Patient Instructions (Addendum)
Strengthening: Isometric Flexion    Using wall for resistance, press right fist into ball using light pressure. Hold _10___ seconds. Repeat __10__ times per set. Do __2__ sets per session. Do __4__ sessions per day.  http://orth.exer.us/800   Copyright  VHI. All rights reserved.  Strengthening: Isometric Extension    Using wall for resistance, press back of left arm into ball using light pressure. Hold _10___ seconds. Repeat _10___ times per set. Do __2__ sets per session. Do __4__ sessions per day.  http://orth.exer.us/804   Copyright  VHI. All rights reserved.  Strengthening: Isometric Abduction    Using wall for resistance, press left arm into ball using light pressure. Hold __10__ seconds. Repeat _10___ times per set. Do _2___ sets per session. Do __4__ sessions per day.  http://orth.exer.us/806   Copyright  VHI. All rights reserved.  Strengthening: Isometric External Rotation    Using wall to provide resistance, and keeping right arm at side, press back of hand into ball using light pressure. Hold __10__ seconds. Repeat _10___ times per set. Do __2__ sets per session. Do ___4_ sessions per day.  http://orth.exer.us/814   Copyright  VHI. All rights reserved.  Strengthening: Isometric Internal Rotation    Using door frame for resistance, press palm of right hand into ball using light pressure. Keep elbow in at side. Hold __10__ seconds. Repeat _10___ times per set. Do __2__ sets per session. Do ___4_ sessions per day.  http://orth.exer.us/816   Copyright  VHI. All rights reserved.

## 2019-12-12 NOTE — Therapy (Signed)
[facility] [place], [place], [address] Phone: [phone]   Fax:  [phone]  Physical Therapy Treatment  Patient Details  Name: [name] [name] MRN: [id number] Date of Birth: May 16, 1951 Referring Provider (PT): Dr. Justice Britain   Encounter Date: 12/12/2019  PT End of Session - 12/12/19 1431    Visit Number  14    Number of Visits  24    Date for PT Re-Evaluation  12/27/19    PT Start Time  0152    PT Stop Time  0235    PT Time Calculation (min)  43 min    Activity Tolerance  Patient tolerated treatment well;Patient limited by pain    Behavior During Therapy  [facility] for tasks assessed/performed       Past Medical History:  Diagnosis Date  . Anemia    hx of years ago   . Anxiety   . Aortic valve disorders   . Arthritis   . Asthma   . Barrett esophagus   . Bipolar disorder ([place])   . CHF (congestive heart failure) ([place])   . Chronic bronchitis ([place])   . Chronic kidney disease    "I don't have but 1" (08/03/2016)  . Complication of anesthesia   . COPD (chronic obstructive pulmonary disease) ([place])    "just have a touch" (08/03/2016)  . Depression   . Esophageal reflux   . Esophageal stricture   . Family history of adverse reaction to anesthesia    brother had nausea and vomiting, mother has had nausea  . Fibromyalgia    "hands, knees, elbows; back; think it's in my hips" (08/03/2016)  . GAD (generalized anxiety disorder)   . GERD (gastroesophageal reflux disease)   . Hiatal hernia   . History of blood transfusion 1970s; 01/08/2013   "low blood count"  . History of kidney stones 01-08-13   past hx.  . History of nuclear stress test 01/2016   low risk study  . History of stomach ulcers    "some have bleed"  . Hyperlipemia   . Hypertension 12/07/2012  . Insomnia, unspecified   . Myocardial infarction [facility]) 01-08-13   '06-Chest pain-no stent-dx. MI-stress related  . Osteoporosis   . Personal history of colonic  polyps 09/2010   TUBULAR ADENOMAS (X3); NEGATIVE FOR HIGH GRADE DYSPLASIA OR MALIGNANCY.  [name] Pneumonia    "several times" (08/03/2016)  . PONV (postoperative nausea and vomiting)   . Stroke [facility])    "2 or 3"; remains with some right sided weaknes (08/03/2016)  . Takotsubo syndrome   . Tremor, essential 03/20/2017  . Type II diabetes mellitus ([place])   . Urinary frequency    AND INCONTINENCE    Past Surgical History:  Procedure Laterality Date  . ANTERIOR CERVICAL DECOMP/DISCECTOMY FUSION N/A 01/31/2019   Procedure: ANTERIOR CERVICAL DECOMPRESSION/DISCECTOMY FUSION C5-6;  Surgeon: Melina Schools, MD;  Location: [place];  Service: Orthopedics;  Laterality: N/A;  3 hrs  . ATRIAL FIBRILLATION ABLATION  08/03/2016  . CARDIAC CATHETERIZATION     normal per Dr Acie Fredrickson in note dated 02/06/12   . CARDIAC CATHETERIZATION     "I've had 2 or 3" (08/03/2016)  . CARPAL TUNNEL RELEASE Right   . CATARACT EXTRACTION W/ INTRAOCULAR LENS  IMPLANT, BILATERAL    . COLONOSCOPY W/ BIOPSIES AND POLYPECTOMY    . DILATION AND CURETTAGE OF UTERUS     S/P miscarriage  . ELECTROPHYSIOLOGIC STUDY N/A 08/03/2016   Procedure: Atrial Fibrillation Ablation;  Surgeon:  Will Meredith Leeds, MD;  Location: [place] [facility];  Service: Cardiovascular;  Laterality: N/A;  . ESOPHAGEAL DILATION  01-08-13   2 yrs ago  . HEEL SPUR SURGERY Left 2005  . KNEE ARTHROSCOPY  02/28/2012   Procedure: ARTHROSCOPY KNEE;  Surgeon: Tobi Bastos, MD;  [facility];  Service: Orthopedics;  Laterality: Left;  . LAPAROSCOPIC CHOLECYSTECTOMY  03/2002  . REVERSE SHOULDER ARTHROPLASTY Right 10/10/2019   Procedure: REVERSE SHOULDER ARTHROPLASTY;  Surgeon: Justice Britain, MD;  [facility];  Service: Orthopedics;  Laterality: Right;  144mn  . SHOULDER OPEN ROTATOR CUFF REPAIR Right 2011  . TONSILLECTOMY AND ADENOIDECTOMY  1965  . TOTAL KNEE ARTHROPLASTY Left 01/17/2013   Procedure: LEFT TOTAL KNEE ARTHROPLASTY;  Surgeon: RTobi Bastos MD;   [facility];  Service: Orthopedics;  Laterality: Left;  . TOTAL KNEE ARTHROPLASTY Right 06/19/2014   Procedure: RIGHT TOTAL KNEE ARTHROPLASTY;  Surgeon: RTobi Bastos MD;  [facility];  Service: Orthopedics;  Laterality: Right;  . TUBAL LIGATION    . VAGINAL HYSTERECTOMY     "partial"  . WRIST FLEXION TENDON TENOTOMIES AND PROXIMAL CORPECTOMY W/ WRIST ARTHRODESIS&ILIAC CREST BONE GRAFT      There were no vitals filed for this visit.  Subjective Assessment - 12/12/19 1355    Subjective  COVID-19 screen performed prior to patient entering clinic.  Ongoing pain in shoulder reported today. "Picked up a gallon of milk and a 6 pack of soda"    Pertinent History  R reverse shoulder arthroplasty 10/10/2019    Patient Stated Goals  "Be able to use my arm again"    Currently in Pain?  Yes    Pain Score  6     Pain Location  Shoulder    Pain Orientation  Right    Pain Descriptors / Indicators  Sore    Pain Type  Surgical pain    Pain Onset  More than a month ago    Pain Frequency  Intermittent    Aggravating Factors   movement    Pain Relieving Factors  rest         [facility] PT Assessment - 12/12/19 0001      AROM   Left Shoulder Flexion  70 Degrees    Left Shoulder External Rotation  60 Degrees   supine     PROM   Right Shoulder Flexion  138 Degrees    Right Shoulder External Rotation  70 Degrees                    [facility] Adult PT Treatment/Exercise - 12/12/19 0001      Shoulder Exercises: Supine   Other Supine Exercises  cane for flexion hands close 2x10      Shoulder Exercises: Standing   Other Standing Exercises  5way isometrics with right sholder x 5 each      Shoulder Exercises: Pulleys   Flexion  5 minutes    Other Pulley Exercises  Seated UE ranger x 2 minutes.      Moist Heat Therapy   Number Minutes Moist Heat  10 Minutes    Moist Heat Location  Shoulder      Electrical Stimulation   Electrical Stimulation Location  RT ACJ region.     Electrical Stimulation Action  premod    Electrical Stimulation Parameters  80-_0  x174m    Electrical Stimulation Goals  Pain      Manual Therapy   Manual Therapy  Passive  ROM    Passive ROM  PROM gentle range then rhythmic stabs for all motions and AAROM for circles in flexion             PT Education - 12/12/19 1430    Education Details  isometrics    Person(s) Educated  Patient    Methods  Explanation;Demonstration;Handout    Comprehension  Verbalized understanding;Returned demonstration          PT Long Term Goals - 12/12/19 1359      PT LONG TERM GOAL #1   Title  Independent with a HEP.    Time  6    Period  Weeks    Status  On-going      PT LONG TERM GOAL #2   Title  pt will improve her R shoulder flexion to >/= 115 degrees.    Time  8    Period  Weeks    Status  On-going   AROM 70 degrees 12/12/19     PT LONG TERM GOAL #3   Title  Pt will report improved sleep by 50%.    Baseline  MET 11/21/19    Time  6    Period  Weeks    Status  Achieved      PT LONG TERM GOAL #4   Title  Pt will be able to report pain </= 2/10 with basic ADL's within protocol guidelines.    Baseline  6/10 at rest, unable to perform ADL's without assistance    Time  12    Period  Weeks    Status  On-going   unable to perfom ADL's due to pain 12/12/19     PT LONG TERM GOAL #5   Title  pt will improve AROM to >/= 150 degrees flexion and ER to >/= 60 degrees.    Time  12    Period  Weeks    Status  On-going            Plan - 12/12/19 1432    Clinical Impression Statement  Patient tolerated treatment fair due to pain limitations. Patient has reported using shoulder for some ADL"s and may be doing to much at home. Today reviewed protocol with patient for healing and progression. Today focused on rhythmic stabs and isometrics and gentle ROM. Patient was educated on healing and not doing too much with shoulder at this time. Goals ongoing.    Personal Factors and  Comorbidities  Comorbidity 3+    Comorbidities  DM, CHF, bipolar CKD, anxiety, depression, barrett esophagus, COPD, L TKA, L knee arthroscopy, cardica cath, wrist flexion tendenodesis    Examination-Activity Limitations  Carry;Dressing;Sleep;Reach Overhead;Lift    Examination-Participation Restrictions  Cleaning;Community Activity;Driving;Laundry;Other    Stability/Clinical Decision Making  Stable/Uncomplicated    Rehab Potential  Good    PT Frequency  2x / week    PT Duration  12 weeks    PT Treatment/Interventions  ADLs/Self Care Home Management;Cryotherapy;Electrical Stimulation;Moist Heat;Ultrasound;Functional mobility training;Stair training;Therapeutic activities;Therapeutic exercise;Neuromuscular re-education;Manual techniques;Taping;Vasopneumatic Device;Iontophoresis 86m/ml Dexamethasone    PT Next Visit Plan  Begin Iontophoresis when cert is signed. and cont with rhythmic stabs and isometrics and gentle progression per protocol (9 weeks 12/12/19)    Consulted and Agree with Plan of Care  Patient       Patient will benefit from skilled therapeutic intervention in order to improve the following deficits and impairments:  Pain, Postural dysfunction, Impaired UE functional use, Decreased strength, Decreased range of motion, Increased edema  Visit Diagnosis: Stiffness  of right shoulder, not elsewhere classified  Acute pain of right shoulder     Problem List Patient Active Problem List   Diagnosis Date Noted  . Cervical disc herniation 01/31/2019  . Stomatitis 07/03/2018  . TMJ (sprain of temporomandibular joint), initial encounter 07/03/2018  . Tremor, essential 03/20/2017  . Syncope and collapse 03/20/2017  . AF (atrial fibrillation) ([place]) 08/03/2016  . Hypokalemia 02/25/2016  . Anxiety 02/24/2016  . Stroke-like symptoms 02/24/2016  . Weakness 01/26/2016  . Atrial fibrillation ([place]) 01/26/2016  . History of colon polyps 01/28/2015  . Dysphagia 01/28/2015  . History of  esophageal stricture 01/28/2015  . Primary osteoarthritis of right knee 06/24/2014  . Acute systolic heart failure ([place]) 06/23/2014  . Takotsubo syndrome 06/21/2014  . Elevated troponin 06/20/2014  . Atrial fibrillation with RVR ([place]) 06/20/2014  . Congestive dilated cardiomyopathy ([place]) 06/20/2014  . Demand ischemia of myocardium ([place]) 06/20/2014  . 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 Respiratory failure requiring intubation ([place]) 06/19/2014  . Acute pulmonary edema ([place]) 06/19/2014  . Acute respiratory failure with hypoxia ([place]) 06/19/2014  . Hypoxemia   . Arterial hypotension   . Gait instability 10/26/2013  . Stroke ([place]) 10/26/2013  . TIA (transient ischemic attack) 10/26/2013  . Tobacco user 05/20/2013  . Postoperative anemia due to acute blood loss 01/18/2013  . Osteoarthritis of left knee 01/17/2013  . Essential hypertension 12/07/2012  . Peripheral edema 12/07/2012  . Diabetes mellitus with complication ([place]) 09/73/5329  . COPD mixed type ([place]) 08/05/2012  . Seasonal and perennial allergic rhinitis 08/05/2012  . Urticaria 08/05/2012  . Chest pain 02/06/2012  . Hyperlipidemia 02/06/2012    Phillips Climes, PTA 12/12/2019, 2:39 PM  [facility] [place], [place], [address] Phone: [phone]   Fax:  [phone]  Name: [name] [name] MRN: [id number] Date of Birth: 04-01-1951

## 2019-12-17 ENCOUNTER — Ambulatory Visit: Payer: Medicare Other | Admitting: Physical Therapy

## 2019-12-17 ENCOUNTER — Other Ambulatory Visit: Payer: Self-pay

## 2019-12-17 DIAGNOSIS — M6281 Muscle weakness (generalized): Secondary | ICD-10-CM

## 2019-12-17 DIAGNOSIS — M25611 Stiffness of right shoulder, not elsewhere classified: Secondary | ICD-10-CM

## 2019-12-17 DIAGNOSIS — M25511 Pain in right shoulder: Secondary | ICD-10-CM

## 2019-12-17 NOTE — Therapy (Addendum)
[facility] [place], [place], [address] Phone: [phone]   Fax:  [phone]  Physical Therapy Treatment  Patient Details  Name: [name] [name] MRN: [id number] Date of Birth: [birth year] Referring Provider (PT): Dr. Justice Britain   Encounter Date: 12/17/2019  PT End of Session - 12/17/19 1448    Visit Number  15    Number of Visits  24    Date for PT Re-Evaluation  12/27/19    PT Start Time  0153    PT Stop Time  0232    PT Time Calculation (min)  39 min    Activity Tolerance  Patient tolerated treatment well;Patient limited by pain    Behavior During Therapy  [facility] for tasks assessed/performed       Past Medical History:  Diagnosis Date  . Anemia    hx of years ago   . Anxiety   . Aortic valve disorders   . Arthritis   . Asthma   . Barrett esophagus   . Bipolar disorder ([place])   . CHF (congestive heart failure) ([place])   . Chronic bronchitis ([place])   . Chronic kidney disease    "I don't have but 1" (08/03/2016)  . Complication of anesthesia   . COPD (chronic obstructive pulmonary disease) ([place])    "just have a touch" (08/03/2016)  . Depression   . Esophageal reflux   . Esophageal stricture   . Family history of adverse reaction to anesthesia    brother had nausea and vomiting, mother has had nausea  . Fibromyalgia    "hands, knees, elbows; back; think it's in my hips" (08/03/2016)  . GAD (generalized anxiety disorder)   . GERD (gastroesophageal reflux disease)   . Hiatal hernia   . History of blood transfusion 1970s; 01/08/2013   "low blood count"  . History of kidney stones 01-08-13   past hx.  . History of nuclear stress test 01/2016   low risk study  . History of stomach ulcers    "some have bleed"  . Hyperlipemia   . Hypertension 12/07/2012  . Insomnia, unspecified   . Myocardial infarction [facility]) 01-08-13   '06-Chest pain-no stent-dx. MI-stress related  . Osteoporosis   . Personal history of colonic  polyps 09/2010   TUBULAR ADENOMAS (X3); NEGATIVE FOR HIGH GRADE DYSPLASIA OR MALIGNANCY.  [name] Pneumonia    "several times" (08/03/2016)  . PONV (postoperative nausea and vomiting)   . Stroke [facility])    "2 or 3"; remains with some right sided weaknes (08/03/2016)  . Takotsubo syndrome   . Tremor, essential 03/20/2017  . Type II diabetes mellitus ([place])   . Urinary frequency    AND INCONTINENCE    Past Surgical History:  Procedure Laterality Date  . ANTERIOR CERVICAL DECOMP/DISCECTOMY FUSION N/A 01/31/2019   Procedure: ANTERIOR CERVICAL DECOMPRESSION/DISCECTOMY FUSION C5-6;  Surgeon: Melina Schools, MD;  Location: [place];  Service: Orthopedics;  Laterality: N/A;  3 hrs  . ATRIAL FIBRILLATION ABLATION  08/03/2016  . CARDIAC CATHETERIZATION     normal per Dr Acie Fredrickson in note dated 02/06/12   . CARDIAC CATHETERIZATION     "I've had 2 or 3" (08/03/2016)  . CARPAL TUNNEL RELEASE Right   . CATARACT EXTRACTION W/ INTRAOCULAR LENS  IMPLANT, BILATERAL    . COLONOSCOPY W/ BIOPSIES AND POLYPECTOMY    . DILATION AND CURETTAGE OF UTERUS     S/P miscarriage  . ELECTROPHYSIOLOGIC STUDY N/A 08/03/2016   Procedure: Atrial Fibrillation Ablation;  Surgeon:  Will Meredith Leeds, MD;  Location: [place] CV LAB;  Service: Cardiovascular;  Laterality: N/A;  . ESOPHAGEAL DILATION  01-08-13   2 yrs ago  . HEEL SPUR SURGERY Left 2005  . KNEE ARTHROSCOPY  02/28/2012   Procedure: ARTHROSCOPY KNEE;  Surgeon: Tobi Bastos, MD;  [facility];  Service: Orthopedics;  Laterality: Left;  . LAPAROSCOPIC CHOLECYSTECTOMY  03/2002  . REVERSE SHOULDER ARTHROPLASTY Right 10/10/2019   Procedure: REVERSE SHOULDER ARTHROPLASTY;  Surgeon: Justice Britain, MD;  [facility];  Service: Orthopedics;  Laterality: Right;  139mn  . SHOULDER OPEN ROTATOR CUFF REPAIR Right 2011  . TONSILLECTOMY AND ADENOIDECTOMY  1965  . TOTAL KNEE ARTHROPLASTY Left 01/17/2013   Procedure: LEFT TOTAL KNEE ARTHROPLASTY;  Surgeon: RTobi Bastos MD;   [facility];  Service: Orthopedics;  Laterality: Left;  . TOTAL KNEE ARTHROPLASTY Right 06/19/2014   Procedure: RIGHT TOTAL KNEE ARTHROPLASTY;  Surgeon: RTobi Bastos MD;  [facility];  Service: Orthopedics;  Laterality: Right;  . TUBAL LIGATION    . VAGINAL HYSTERECTOMY     "partial"  . WRIST FLEXION TENDON TENOTOMIES AND PROXIMAL CORPECTOMY W/ WRIST ARTHRODESIS&ILIAC CREST BONE GRAFT      There were no vitals filed for this visit.  Subjective Assessment - 12/17/19 1710    Subjective  LBP many years ago.  Pain at a 9.  I'm doing too much at home.                        [facility] Adult PT Treatment/Exercise - 12/17/19 0001      Shoulder Exercises: Pulleys   Flexion  5 minutes    Other Pulley Exercises  Seated UE Ranger x 3 minutes.      Modalities   Modalities  EPsychologist, educationalLocation  RT ACJ    Electrical Stimulation Action  Pre-mod.    Electrical Stimulation Parameters  80-150 Hz x 20 minutes.    Electrical Stimulation Goals  Pain      Vasopneumatic   Number Minutes Vasopneumatic   20 minutes    Vasopnuematic Location   --   RT SHLD.   Vasopneumatic Pressure  Low      Manual Therapy   Manual Therapy  Passive ROM    Passive ROM  In supine:  Passive right shoulder PROM into flexion and ER x 5 minutes.                  PT Long Term Goals - 12/12/19 1359      PT LONG TERM GOAL #1   Title  Independent with a HEP.    Time  6    Period  Weeks    Status  On-going      PT LONG TERM GOAL #2   Title  pt will improve her R shoulder flexion to >/= 115 degrees.    Time  8    Period  Weeks    Status  On-going   AROM 70 degrees 12/12/19     PT LONG TERM GOAL #3   Title  Pt will report improved sleep by 50%.    Baseline  MET 11/21/19    Time  6    Period  Weeks    Status  Achieved      PT LONG TERM GOAL #4   Title  Pt will be able to report pain </= 2/10  with basic ADL's within protocol guidelines.    Baseline  6/10 at rest, unable to perform ADL's without assistance    Time  12    Period  Weeks    Status  On-going   unable to perfom ADL's due to pain 12/12/19     PT LONG TERM GOAL #5   Title  pt will improve AROM to >/= 150 degrees flexion and ER to >/= 60 degrees.    Time  12    Period  Weeks    Status  On-going            Plan - 12/17/19 1447    Clinical Impression Statement  Patient continues to have a severe "grabbing" feeling localized to her right ACJ.  This has prohibited her from progressing per protocol.  She admits to doing too much at home.    Comorbidities  DM, CHF, bipolar CKD, anxiety, depression, barrett esophagus, COPD, L TKA, L knee arthroscopy, cardica cath, wrist flexion tendenodesis    Examination-Activity Limitations  Carry;Dressing;Sleep;Reach Overhead;Lift    Examination-Participation Restrictions  Cleaning;Community Activity;Driving;Laundry;Other    Stability/Clinical Decision Making  Stable/Uncomplicated    Rehab Potential  Good    PT Frequency  2x / week    PT Duration  12 weeks    PT Treatment/Interventions  ADLs/Self Care Home Management;Cryotherapy;Electrical Stimulation;Moist Heat;Ultrasound;Functional mobility training;Stair training;Therapeutic activities;Therapeutic exercise;Neuromuscular re-education;Manual techniques;Taping;Vasopneumatic Device;Iontophoresis 62m/ml Dexamethasone    PT Next Visit Plan  Begin Iontophoresis when cert is signed. and cont with rhythmic stabs and isometrics and gentle progression per protocol (9 weeks 12/12/19)    PT Home Exercise Plan  see pt instructions    Consulted and Agree with Plan of Care  Patient       Patient will benefit from skilled therapeutic intervention in order to improve the following deficits and impairments:  Pain, Postural dysfunction, Impaired UE functional use, Decreased strength, Decreased range of motion, Increased edema  Visit  Diagnosis: Stiffness of right shoulder, not elsewhere classified  Acute pain of right shoulder  Muscle weakness (generalized)     Problem List Patient Active Problem List   Diagnosis Date Noted  . Cervical disc herniation 01/31/2019  . Stomatitis 07/03/2018  . TMJ (sprain of temporomandibular joint), initial encounter 07/03/2018  . Tremor, essential 03/20/2017  . Syncope and collapse 03/20/2017  . AF (atrial fibrillation) ([place] 08/03/2016  . Hypokalemia 02/25/2016  . Anxiety 02/24/2016  . Stroke-like symptoms 02/24/2016  . Weakness 01/26/2016  . Atrial fibrillation ([place] 01/26/2016  . History of colon polyps 01/28/2015  . Dysphagia 01/28/2015  . History of esophageal stricture 01/28/2015  . Primary osteoarthritis of right knee 06/24/2014  . Acute systolic heart failure ([place] 06/23/2014  . Takotsubo syndrome 06/21/2014  . Elevated troponin 06/20/2014  . Atrial fibrillation with RVR ([place] 06/20/2014  . Congestive dilated cardiomyopathy ([place] 06/20/2014  . Demand ischemia of myocardium ([place] 06/20/2014  . 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 Respiratory failure requiring intubation ([place] 06/19/2014  . Acute pulmonary edema (HO[place] 06/19/2014  . Acute respiratory failure with hypoxia (HE[place] 06/19/2014  . Hypoxemia   . Arterial hypotension   . Gait instability 10/26/2013  . Stroke ([place] 10/26/2013  . TIA (transient ischemic attack) 10/26/2013  . Tobacco user 05/20/2013  . Postoperative anemia due to acute blood loss 01/18/2013  . Osteoarthritis of left knee 01/17/2013  . Essential hypertension 12/07/2012  . Peripheral edema 12/07/2012  . Diabetes mellitus with complication (H[place] 042/59/5638 . COPD mixed type ([place]  08/05/2012  . Seasonal and perennial allergic rhinitis 08/05/2012  . Urticaria 08/05/2012  . Chest pain 02/06/2012  . Hyperlipidemia 02/06/2012    APPLEGATE, [place] MPT 12/17/2019, 5:11 PM  [facility] [address] [place], [place], [address] Phone: [phone]   Fax:  [phone]  Name: [name] [name] MRN: [id number] Date of Birth: 1951/03/26

## 2019-12-19 ENCOUNTER — Encounter: Payer: Self-pay | Admitting: Physical Therapy

## 2019-12-19 ENCOUNTER — Other Ambulatory Visit: Payer: Self-pay

## 2019-12-19 ENCOUNTER — Ambulatory Visit: Payer: Medicare Other | Admitting: Physical Therapy

## 2019-12-19 DIAGNOSIS — M25511 Pain in right shoulder: Secondary | ICD-10-CM

## 2019-12-19 DIAGNOSIS — M25611 Stiffness of right shoulder, not elsewhere classified: Secondary | ICD-10-CM

## 2019-12-19 NOTE — Therapy (Signed)
[facility] [place], [place], [address] Phone: [phone]   Fax:  [phone]  Physical Therapy Treatment  Patient Details  Name: [name] [name] MRN: [id number] Date of Birth: 14-Feb-1951 Referring Provider (PT): Dr. Justice Britain   Encounter Date: 12/19/2019  PT End of Session - 12/19/19 1152    Visit Number  16    Number of Visits  24    Date for PT Re-Evaluation  12/27/19    PT Start Time  1120    PT Stop Time  1200    PT Time Calculation (min)  40 min    Activity Tolerance  Patient tolerated treatment well;Patient limited by pain    Behavior During Therapy  [facility] for tasks assessed/performed       Past Medical History:  Diagnosis Date  . Anemia    hx of years ago   . Anxiety   . Aortic valve disorders   . Arthritis   . Asthma   . Barrett esophagus   . Bipolar disorder ([place])   . CHF (congestive heart failure) ([place])   . Chronic bronchitis ([place])   . Chronic kidney disease    "I don't have but 1" (08/03/2016)  . Complication of anesthesia   . COPD (chronic obstructive pulmonary disease) ([place])    "just have a touch" (08/03/2016)  . Depression   . Esophageal reflux   . Esophageal stricture   . Family history of adverse reaction to anesthesia    brother had nausea and vomiting, mother has had nausea  . Fibromyalgia    "hands, knees, elbows; back; think it's in my hips" (08/03/2016)  . GAD (generalized anxiety disorder)   . GERD (gastroesophageal reflux disease)   . Hiatal hernia   . History of blood transfusion 1970s; 01/08/2013   "low blood count"  . History of kidney stones 01-08-13   past hx.  . History of nuclear stress test 01/2016   low risk study  . History of stomach ulcers    "some have bleed"  . Hyperlipemia   . Hypertension 12/07/2012  . Insomnia, unspecified   . Myocardial infarction [facility]) 01-08-13   '06-Chest pain-no stent-dx. MI-stress related  . Osteoporosis   . Personal history of colonic  polyps 09/2010   TUBULAR ADENOMAS (X3); NEGATIVE FOR HIGH GRADE DYSPLASIA OR MALIGNANCY.  [name] Pneumonia    "several times" (08/03/2016)  . PONV (postoperative nausea and vomiting)   . Stroke [facility])    "2 or 3"; remains with some right sided weaknes (08/03/2016)  . Takotsubo syndrome   . Tremor, essential 03/20/2017  . Type II diabetes mellitus ([place])   . Urinary frequency    AND INCONTINENCE    Past Surgical History:  Procedure Laterality Date  . ANTERIOR CERVICAL DECOMP/DISCECTOMY FUSION N/A 01/31/2019   Procedure: ANTERIOR CERVICAL DECOMPRESSION/DISCECTOMY FUSION C5-6;  Surgeon: Melina Schools, MD;  Location: [place];  Service: Orthopedics;  Laterality: N/A;  3 hrs  . ATRIAL FIBRILLATION ABLATION  08/03/2016  . CARDIAC CATHETERIZATION     normal per Dr Acie Fredrickson in note dated 02/06/12   . CARDIAC CATHETERIZATION     "I've had 2 or 3" (08/03/2016)  . CARPAL TUNNEL RELEASE Right   . CATARACT EXTRACTION W/ INTRAOCULAR LENS  IMPLANT, BILATERAL    . COLONOSCOPY W/ BIOPSIES AND POLYPECTOMY    . DILATION AND CURETTAGE OF UTERUS     S/P miscarriage  . ELECTROPHYSIOLOGIC STUDY N/A 08/03/2016   Procedure: Atrial Fibrillation Ablation;  Surgeon:  Will Meredith Leeds, MD;  Location: [place] CV [facility];  Service: Cardiovascular;  Laterality: N/A;  . ESOPHAGEAL DILATION  01-08-13   2 yrs ago  . HEEL SPUR SURGERY Left 2005  . KNEE ARTHROSCOPY  02/28/2012   Procedure: ARTHROSCOPY KNEE;  Surgeon: Tobi Bastos, MD;  [facility];  Service: Orthopedics;  Laterality: Left;  . LAPAROSCOPIC CHOLECYSTECTOMY  03/2002  . REVERSE SHOULDER ARTHROPLASTY Right 10/10/2019   Procedure: REVERSE SHOULDER ARTHROPLASTY;  Surgeon: Justice Britain, MD;  [facility];  Service: Orthopedics;  Laterality: Right;  164mn  . SHOULDER OPEN ROTATOR CUFF REPAIR Right 2011  . TONSILLECTOMY AND ADENOIDECTOMY  1965  . TOTAL KNEE ARTHROPLASTY Left 01/17/2013   Procedure: LEFT TOTAL KNEE ARTHROPLASTY;  Surgeon: RTobi Bastos MD;   [facility];  Service: Orthopedics;  Laterality: Left;  . TOTAL KNEE ARTHROPLASTY Right 06/19/2014   Procedure: RIGHT TOTAL KNEE ARTHROPLASTY;  Surgeon: RTobi Bastos MD;  [facility];  Service: Orthopedics;  Laterality: Right;  . TUBAL LIGATION    . VAGINAL HYSTERECTOMY     "partial"  . WRIST FLEXION TENDON TENOTOMIES AND PROXIMAL CORPECTOMY W/ WRIST ARTHRODESIS&ILIAC CREST BONE GRAFT      There were no vitals filed for this visit.  Subjective Assessment - 12/19/19 1144    Subjective  COVID-19 screen performed prior to patient entering clinic.  Pain much lower today.    Pertinent History  R reverse shoulder arthroplasty 10/10/2019    Patient Stated Goals  "Be able to use my arm again"    Currently in Pain?  Yes    Pain Location  Shoulder    Pain Orientation  Right    Pain Descriptors / Indicators  --   "Grab."   Pain Onset  More than a month ago                        [facility] 12/19[facility]19 0001      Modalities   Modalities  Electrical Stimulation;Vasopneumatic      Electrical Stimulation   Electrical Stimulation Location  RT ACJ    Electrical Stimulation Action  Pre-mod.    Electrical Stimulation Parameters  80-150 Hz x 158mutes.    Electrical Stimulation Goals  Pain      Vasopneumatic   Number Minutes Vasopneumatic   19 minutes    Vasopnuematic Location   --   RT shoulder.   Vasopneumatic Pressure  Low      Manual Therapy   Manual Therapy  Passive ROM    Passive ROM  In supine:  Right shoulder AAROM and PROM with focus on flexion and ER x 16 minutes.                  PT Long Term Goals - 12/12/19 1359      PT LONG TERM GOAL #1   Title  Independent with a HEP.    Time  6    Period  Weeks    Status  On-going      PT LONG TERM GOAL #2   Title  pt will improve her R shoulder flexion to >/= 115 degrees.    Time  8    Period  Weeks    Status  On-going   AROM 70 degrees 12/12/19     PT LONG TERM  GOAL #3   Title  Pt will report improved sleep by 50%.    Baseline  MET  11/21/19    Time  6    Period  Weeks    Status  Achieved      PT LONG TERM GOAL #4   Title  Pt will be able to report pain </= 2/10 with basic ADL's within protocol guidelines.    Baseline  6/10 at rest, unable to perform ADL's without assistance    Time  12    Period  Weeks    Status  On-going   unable to perfom ADL's due to pain 12/12/19     PT LONG TERM GOAL #5   Title  pt will improve AROM to >/= 150 degrees flexion and ER to >/= 60 degrees.    Time  12    Period  Weeks    Status  On-going            Plan - 12/19/19 1153    Clinical Impression Statement  The patient with much lower pain today.  More conservative treatment today with P and AAROM performed in supine today with only one incidence of "grabbing" today which patient points to her right ACJ when this occurs.    Personal Factors and Comorbidities  Comorbidity 3+    Comorbidities  DM, CHF, bipolar CKD, anxiety, depression, barrett esophagus, COPD, L TKA, L knee arthroscopy, cardiac cath, wrist flexion tendenodesis    Examination-Activity Limitations  Carry;Dressing;Sleep;Reach Overhead;Lift    Stability/Clinical Decision Making  Stable/Uncomplicated    Rehab Potential  Good    PT Frequency  2x / week    PT Duration  12 weeks    PT Treatment/Interventions  ADLs/Self Care Home Management;Cryotherapy;Electrical Stimulation;Moist Heat;Ultrasound;Functional mobility training;Stair training;Therapeutic activities;Therapeutic exercise;Neuromuscular re-education;Manual techniques;Taping;Vasopneumatic Device;Iontophoresis 27m/ml Dexamethasone    PT Next Visit Plan  Begin Iontophoresis when cert is signed. and cont with rhythmic stabs and isometrics and gentle progression per protocol (9 weeks 12/12/19)    PT Home Exercise Plan  see pt instructions    Consulted and Agree with Plan of Care  Patient       Patient will benefit from skilled therapeutic  intervention in order to improve the following deficits and impairments:     Visit Diagnosis: Stiffness of right shoulder, not elsewhere classified  Acute pain of right shoulder     Problem List Patient Active Problem List   Diagnosis Date Noted  . Cervical disc herniation 01/31/2019  . Stomatitis 07/03/2018  . TMJ (sprain of temporomandibular joint), initial encounter 07/03/2018  . Tremor, essential 03/20/2017  . Syncope and collapse 03/20/2017  . AF (atrial fibrillation) ([place] 08/03/2016  . Hypokalemia 02/25/2016  . Anxiety 02/24/2016  . Stroke-like symptoms 02/24/2016  . Weakness 01/26/2016  . Atrial fibrillation (HC[place] Valley 01/26/2016  . History of colon polyps 01/28/2015  . Dysphagia 01/28/2015  . History of esophageal stricture 01/28/2015  . Primary osteoarthritis of right knee 06/24/2014  . Acute systolic heart failure (HM[place] 06/23/2014  . Takotsubo syndrome 06/21/2014  . Elevated troponin 06/20/2014  . Atrial fibrillation with RVR (HN[place] 06/20/2014  . Congestive dilated cardiomyopathy (HStone[place] 06/20/2014  . Demand ischemia of myocardium (HT[place] 06/20/2014  . 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 Respiratory failure requiring intubation (HPlay[place] 06/19/2014  . Acute pulmonary edema (HT[place] 06/19/2014  . Acute respiratory failure with hypoxia (HL[place] 06/19/2014  . Hypoxemia   . Arterial hypotension   . Gait instability 10/26/2013  . Stroke (H[place] 10/26/2013  . TIA (transient ischemic attack) 10/26/2013  . Tobacco user 05/20/2013  . Postoperative anemia due to acute blood  loss 01/18/2013  . Osteoarthritis of left knee 01/17/2013  . Essential hypertension 12/07/2012  . Peripheral edema 12/07/2012  . Diabetes mellitus with complication ([place]) 98/60/9016  . COPD mixed type ([place]) 08/05/2012  . Seasonal and perennial allergic rhinitis 08/05/2012  . Urticaria 08/05/2012  . Chest pain 02/06/2012  . Hyperlipidemia 02/06/2012    Simren Popson, [place] MPT 12/19/2019, 12:08  PM  [facility] [address] [place], [place], [address] Phone: [phone]   Fax:  [phone]  Name: NIQUITA DIGIOIA MRN: [id number] Date of Birth: Mar 01, 1951

## 2019-12-24 ENCOUNTER — Ambulatory Visit: Payer: Medicare Other | Admitting: Physical Therapy

## 2019-12-24 DIAGNOSIS — M25511 Pain in right shoulder: Secondary | ICD-10-CM

## 2019-12-24 DIAGNOSIS — M25611 Stiffness of right shoulder, not elsewhere classified: Secondary | ICD-10-CM | POA: Diagnosis not present

## 2019-12-24 NOTE — Therapy (Signed)
[facility] [place], [place], [address] Phone: [phone]   Fax:  [phone]  Physical Therapy Treatment  Patient Details  Name: [name] [name] MRN: [id number] Date of Birth: 11/07/1950 Referring Provider (PT): Dr. Justice Britain   Encounter Date: 12/24/2019  PT End of Session - 12/24/19 1348    Visit Number  17    Number of Visits  24    Date for PT Re-Evaluation  12/27/19    PT Start Time  1115    PT Stop Time  1159    PT Time Calculation (min)  44 min    Activity Tolerance  Patient tolerated treatment well    Behavior During Therapy  [facility] for tasks assessed/performed       Past Medical History:  Diagnosis Date  . Anemia    hx of years ago   . Anxiety   . Aortic valve disorders   . Arthritis   . Asthma   . Barrett esophagus   . Bipolar disorder ([place])   . CHF (congestive heart failure) ([place])   . Chronic bronchitis ([place])   . Chronic kidney disease    "I don't have but 1" (08/03/2016)  . Complication of anesthesia   . COPD (chronic obstructive pulmonary disease) ([place])    "just have a touch" (08/03/2016)  . Depression   . Esophageal reflux   . Esophageal stricture   . Family history of adverse reaction to anesthesia    brother had nausea and vomiting, mother has had nausea  . Fibromyalgia    "hands, knees, elbows; back; think it's in my hips" (08/03/2016)  . GAD (generalized anxiety disorder)   . GERD (gastroesophageal reflux disease)   . Hiatal hernia   . History of blood transfusion 1970s; 01/08/2013   "low blood count"  . History of kidney stones 01-08-13   past hx.  . History of nuclear stress test 01/2016   low risk study  . History of stomach ulcers    "some have bleed"  . Hyperlipemia   . Hypertension 12/07/2012  . Insomnia, unspecified   . Myocardial infarction [facility]) 01-08-13   '06-Chest pain-no stent-dx. MI-stress related  . Osteoporosis   . Personal history of colonic polyps 09/2010   TUBULAR  ADENOMAS (X3); NEGATIVE FOR HIGH GRADE DYSPLASIA OR MALIGNANCY.  [name] Pneumonia    "several times" (08/03/2016)  . PONV (postoperative nausea and vomiting)   . Stroke [facility])    "2 or 3"; remains with some right sided weaknes (08/03/2016)  . Takotsubo syndrome   . Tremor, essential 03/20/2017  . Type II diabetes mellitus ([place])   . Urinary frequency    AND INCONTINENCE    Past Surgical History:  Procedure Laterality Date  . ANTERIOR CERVICAL DECOMP/DISCECTOMY FUSION N/A 01/31/2019   Procedure: ANTERIOR CERVICAL DECOMPRESSION/DISCECTOMY FUSION C5-6;  Surgeon: Melina Schools, MD;  Location: [facility];  Service: Orthopedics;  Laterality: N/A;  3 hrs  . ATRIAL FIBRILLATION ABLATION  08/03/2016  . CARDIAC CATHETERIZATION     normal per Dr Acie Fredrickson in note dated 02/06/12   . CARDIAC CATHETERIZATION     "I've had 2 or 3" (08/03/2016)  . CARPAL TUNNEL RELEASE Right   . CATARACT EXTRACTION W/ INTRAOCULAR LENS  IMPLANT, BILATERAL    . COLONOSCOPY W/ BIOPSIES AND POLYPECTOMY    . DILATION AND CURETTAGE OF UTERUS     S/P miscarriage  . ELECTROPHYSIOLOGIC STUDY N/A 08/03/2016   Procedure: Atrial Fibrillation Ablation;  Surgeon: [employer],  MD;  Location: [facility];  Service: Cardiovascular;  Laterality: N/A;  . ESOPHAGEAL DILATION  01-08-13   2 yrs ago  . HEEL SPUR SURGERY Left 2005  . KNEE ARTHROSCOPY  02/28/2012   Procedure: ARTHROSCOPY KNEE;  Surgeon: Tobi Bastos, MD;  [facility];  Service: Orthopedics;  Laterality: Left;  . LAPAROSCOPIC CHOLECYSTECTOMY  03/2002  . REVERSE SHOULDER ARTHROPLASTY Right 10/10/2019   Procedure: REVERSE SHOULDER ARTHROPLASTY;  Surgeon: Justice Britain, MD;  [facility];  Service: Orthopedics;  Laterality: Right;  137mn  . SHOULDER OPEN ROTATOR CUFF REPAIR Right 2011  . TONSILLECTOMY AND ADENOIDECTOMY  1965  . TOTAL KNEE ARTHROPLASTY Left 01/17/2013   Procedure: LEFT TOTAL KNEE ARTHROPLASTY;  Surgeon: RTobi Bastos MD;  [facility];  Service:  Orthopedics;  Laterality: Left;  . TOTAL KNEE ARTHROPLASTY Right 06/19/2014   Procedure: RIGHT TOTAL KNEE ARTHROPLASTY;  Surgeon: RTobi Bastos MD;  [facility];  Service: Orthopedics;  Laterality: Right;  . TUBAL LIGATION    . VAGINAL HYSTERECTOMY     "partial"  . WRIST FLEXION TENDON TENOTOMIES AND PROXIMAL CORPECTOMY W/ WRIST ARTHRODESIS&ILIAC CREST BONE GRAFT      There were no vitals filed for this visit.  Subjective Assessment - 12/24/19 1254    Subjective  COVID-19 screen performed prior to patient entering clinic.  Hasn't grabbed or caught the last two days.    Pertinent History  R reverse shoulder arthroplasty 10/10/2019    Patient Stated Goals  "Be able to use my arm again"    Pain Location  Shoulder    Pain Orientation  Right    Pain Descriptors / Indicators  DSimonne MartinetAdult PT Treatment/Exercise - 12/24/19 0001      Modalities   Modalities  Electrical Stimulation;Vasopneumatic      Electrical Stimulation   Electrical Stimulation Location  RT ACJ.    Electrical Stimulation Action  Pre-mod.    Electrical Stimulation Parameters  80-150 Hz x 20 minutes.    Electrical Stimulation Goals  Pain      Vasopneumatic   Number Minutes Vasopneumatic   20 minutes    Vasopnuematic Location   --   RT SHLD.   Vasopneumatic Pressure  Low      Manual Therapy   Manual Therapy  Passive ROM    Passive ROM  PROM and AAROM to patient's right shoulder in supine today x 11 minutes.                  PT Long Term Goals - 12/12/19 1359      PT LONG TERM GOAL #1   Title  Independent with a HEP.    Time  6    Period  Weeks    Status  On-going      PT LONG TERM GOAL #2   Title  pt will improve her R shoulder flexion to >/= 115 degrees.    Time  8    Period  Weeks    Status  On-going   AROM 70 degrees 12/12/19     PT LONG TERM GOAL #3   Title  Pt will report improved sleep by 50%.    Baseline  MET 11/21/19    Time  6     Period  Weeks    Status  Achieved  PT LONG TERM GOAL #4   Title  Pt will be able to report pain </= 2/10 with basic ADL's within protocol guidelines.    Baseline  6/10 at rest, unable to perform ADL's without assistance    Time  12    Period  Weeks    Status  On-going   unable to perfom ADL's due to pain 12/12/19     PT LONG TERM GOAL #5   Title  pt will improve AROM to >/= 150 degrees flexion and ER to >/= 60 degrees.    Time  12    Period  Weeks    Status  On-going            Plan - 12/24/19 1350    Clinical Impression Statement  Patient doing much better over the last couple of days with no reports of catching or grabbing in her right shoulder.  Should be able to resume normal protocol guidelines at this time.    Personal Factors and Comorbidities  Comorbidity 3+    Comorbidities  DM, CHF, bipolar CKD, anxiety, depression, barrett esophagus, COPD, L TKA, L knee arthroscopy, cardiac cath, wrist flexion tendenodesis    Examination-Activity Limitations  Carry;Dressing;Sleep;Reach Overhead;Lift    Examination-Participation Restrictions  Cleaning;Community Activity;Driving;Laundry;Other    Stability/Clinical Decision Making  Stable/Uncomplicated    Rehab Potential  Good    PT Frequency  2x / week    PT Duration  12 weeks    PT Treatment/Interventions  ADLs/Self Care Home Management;Cryotherapy;Electrical Stimulation;Moist Heat;Ultrasound;Functional mobility training;Stair training;Therapeutic activities;Therapeutic exercise;Neuromuscular re-education;Manual techniques;Taping;Vasopneumatic Device;Iontophoresis '4mg'$ /ml Dexamethasone    PT Next Visit Plan  Begin Iontophoresis when cert is signed. and cont with rhythmic stabs and isometrics and gentle progression per protocol (9 weeks 12/12/19)    PT Home Exercise Plan  see pt instructions    Consulted and Agree with Plan of Care  Patient       Patient will benefit from skilled therapeutic intervention in order to improve the  following deficits and impairments:  Pain, Postural dysfunction, Impaired UE functional use, Decreased strength, Decreased range of motion, Increased edema  Visit Diagnosis: Stiffness of right shoulder, not elsewhere classified  Acute pain of right shoulder     Problem List Patient Active Problem List   Diagnosis Date Noted  . Cervical disc herniation 01/31/2019  . Stomatitis 07/03/2018  . TMJ (sprain of temporomandibular joint), initial encounter 07/03/2018  . Tremor, essential 03/20/2017  . Syncope and collapse 03/20/2017  . AF (atrial fibrillation) ([place]) 08/03/2016  . Hypokalemia 02/25/2016  . Anxiety 02/24/2016  . Stroke-like symptoms 02/24/2016  . Weakness 01/26/2016  . Atrial fibrillation ([place]) 01/26/2016  . History of colon polyps 01/28/2015  . Dysphagia 01/28/2015  . History of esophageal stricture 01/28/2015  . Primary osteoarthritis of right knee 06/24/2014  . Acute systolic heart failure ([place]) 06/23/2014  . Takotsubo syndrome 06/21/2014  . Elevated troponin 06/20/2014  . Atrial fibrillation with RVR ([place]) 06/20/2014  . Congestive dilated cardiomyopathy ([place]) 06/20/2014  . Demand ischemia of myocardium ([place]) 06/20/2014  . 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 Respiratory failure requiring intubation ([place]) 06/19/2014  . Acute pulmonary edema ([place]) 06/19/2014  . Acute respiratory failure with hypoxia ([place]) 06/19/2014  . Hypoxemia   . Arterial hypotension   . Gait instability 10/26/2013  . Stroke ([place]) 10/26/2013  . TIA (transient ischemic attack) 10/26/2013  . Tobacco user 05/20/2013  . Postoperative anemia due to acute blood loss 01/18/2013  . Osteoarthritis of left  knee 01/17/2013  . Essential hypertension 12/07/2012  . Peripheral edema 12/07/2012  . Diabetes mellitus with complication ([place]) 17/40/8144  . COPD mixed type ([place]) 08/05/2012  . Seasonal and perennial allergic rhinitis 08/05/2012  . Urticaria 08/05/2012  . Chest pain  02/06/2012  . Hyperlipidemia 02/06/2012    APP[place], [place] MPT 12/24/2019, 2:00 PM  [facility] [address] [place], [place], [address] Phone: [phone]   Fax:  [phone]  Name: [name] [name] MRN: [id number] Date of Birth: [birth year]

## 2019-12-26 ENCOUNTER — Other Ambulatory Visit: Payer: Self-pay

## 2019-12-26 ENCOUNTER — Ambulatory Visit: Payer: Medicare Other | Admitting: Physical Therapy

## 2019-12-26 DIAGNOSIS — M25611 Stiffness of right shoulder, not elsewhere classified: Secondary | ICD-10-CM | POA: Diagnosis not present

## 2019-12-26 DIAGNOSIS — M25511 Pain in right shoulder: Secondary | ICD-10-CM

## 2019-12-26 NOTE — Therapy (Signed)
[facility] [place], [place], [address] Phone: [phone]   Fax:  [phone]  Physical Therapy Treatment  Patient Details  Name: [name] [name] MRN: [id number] Date of Birth: 01-07-1951 Referring Provider (PT): Dr. Justice Britain   Encounter Date: 12/26/2019  PT End of Session - 12/26/19 1518    Visit Number  18    Number of Visits  24    Date for PT Re-Evaluation  12/27/19    PT Start Time  0100    PT Stop Time  0150    PT Time Calculation (min)  50 min    Activity Tolerance  Patient tolerated treatment well    Behavior During Therapy  [facility] for tasks assessed/performed       Past Medical History:  Diagnosis Date  . Anemia    hx of years ago   . Anxiety   . Aortic valve disorders   . Arthritis   . Asthma   . Barrett esophagus   . Bipolar disorder ([place])   . CHF (congestive heart failure) ([place])   . Chronic bronchitis ([place])   . Chronic kidney disease    "I don't have but 1" (08/03/2016)  . Complication of anesthesia   . COPD (chronic obstructive pulmonary disease) ([place])    "just have a touch" (08/03/2016)  . Depression   . Esophageal reflux   . Esophageal stricture   . Family history of adverse reaction to anesthesia    brother had nausea and vomiting, mother has had nausea  . Fibromyalgia    "hands, knees, elbows; back; think it's in my hips" (08/03/2016)  . GAD (generalized anxiety disorder)   . GERD (gastroesophageal reflux disease)   . Hiatal hernia   . History of blood transfusion 1970s; 01/08/2013   "low blood count"  . History of kidney stones 01-08-13   past hx.  . History of nuclear stress test 01/2016   low risk study  . History of stomach ulcers    "some have bleed"  . Hyperlipemia   . Hypertension 12/07/2012  . Insomnia, unspecified   . Myocardial infarction [facility]) 01-08-13   '06-Chest pain-no stent-dx. MI-stress related  . Osteoporosis   . Personal history of colonic polyps 09/2010   TUBULAR  ADENOMAS (X3); NEGATIVE FOR HIGH GRADE DYSPLASIA OR MALIGNANCY.  [name] Pneumonia    "several times" (08/03/2016)  . PONV (postoperative nausea and vomiting)   . Stroke [facility])    "2 or 3"; remains with some right sided weaknes (08/03/2016)  . Takotsubo syndrome   . Tremor, essential 03/20/2017  . Type II diabetes mellitus ([place])   . Urinary frequency    AND INCONTINENCE    Past Surgical History:  Procedure Laterality Date  . ANTERIOR CERVICAL DECOMP/DISCECTOMY FUSION N/A 01/31/2019   Procedure: ANTERIOR CERVICAL DECOMPRESSION/DISCECTOMY FUSION C5-6;  Surgeon: Melina Schools, MD;  Location: [facility];  Service: Orthopedics;  Laterality: N/A;  3 hrs  . ATRIAL FIBRILLATION ABLATION  08/03/2016  . CARDIAC CATHETERIZATION     normal per Dr Acie Fredrickson in note dated 02/06/12   . CARDIAC CATHETERIZATION     "I've had 2 or 3" (08/03/2016)  . CARPAL TUNNEL RELEASE Right   . CATARACT EXTRACTION W/ INTRAOCULAR LENS  IMPLANT, BILATERAL    . COLONOSCOPY W/ BIOPSIES AND POLYPECTOMY    . DILATION AND CURETTAGE OF UTERUS     S/P miscarriage  . ELECTROPHYSIOLOGIC STUDY N/A 08/03/2016   Procedure: Atrial Fibrillation Ablation;  Surgeon: [employer],  MD;  Location: [place] [facility];  Service: Cardiovascular;  Laterality: N/A;  . ESOPHAGEAL DILATION  01-08-13   2 yrs ago  . HEEL SPUR SURGERY Left 2005  . KNEE ARTHROSCOPY  02/28/2012   Procedure: ARTHROSCOPY KNEE;  Surgeon: Tobi Bastos, MD;  [facility];  Service: Orthopedics;  Laterality: Left;  . LAPAROSCOPIC CHOLECYSTECTOMY  03/2002  . REVERSE SHOULDER ARTHROPLASTY Right 10/10/2019   Procedure: REVERSE SHOULDER ARTHROPLASTY;  Surgeon: Justice Britain, MD;  [facility];  Service: Orthopedics;  Laterality: Right;  137mn  . SHOULDER OPEN ROTATOR CUFF REPAIR Right 2011  . TONSILLECTOMY AND ADENOIDECTOMY  1965  . TOTAL KNEE ARTHROPLASTY Left 01/17/2013   Procedure: LEFT TOTAL KNEE ARTHROPLASTY;  Surgeon: RTobi Bastos MD;  [facility];  Service:  Orthopedics;  Laterality: Left;  . TOTAL KNEE ARTHROPLASTY Right 06/19/2014   Procedure: RIGHT TOTAL KNEE ARTHROPLASTY;  Surgeon: RTobi Bastos MD;  [facility];  Service: Orthopedics;  Laterality: Right;  . TUBAL LIGATION    . VAGINAL HYSTERECTOMY     "partial"  . WRIST FLEXION TENDON TENOTOMIES AND PROXIMAL CORPECTOMY W/ WRIST ARTHRODESIS&ILIAC CREST BONE GRAFT      There were no vitals filed for this visit.  Subjective Assessment - 12/26/19 1453    Subjective  COVID-19 screen performed prior to patient entering clinic.  It's better.    Pertinent History  R reverse shoulder arthroplasty 10/10/2019    Patient Stated Goals  "Be able to use my arm again"    Currently in Pain?  Yes    Pain Score  4     Pain Location  Shoulder    Pain Orientation  Right    Pain Descriptors / Indicators  Dull    Pain Type  Surgical pain    Pain Onset  More than a month ago                        [facility] Hospital[facility]/Exercise - 12/26/19 0001      Exercises   Exercises  Shoulder      Shoulder Exercises: Standing   Other Standing Exercises  Wall ladder x 4 minutes.    Other Standing Exercises  [facility] lifts into cabinet x 2 minutes.      Modalities   Modalities  EPsychologist, educationalLocation  RT ACJ/middle deltoid.    Electrical Stimulation Action  Pre-mod.    Electrical Stimulation Parameters  80-150 Hz x 15 minutes.    Electrical Stimulation Goals  Pain      Vasopneumatic   Number Minutes Vasopneumatic   15 minutes    Vasopnuematic Location   --   Right shoulder.   Vasopneumatic Pressure  Low      Manual Therapy   Manual Therapy  Soft tissue mobilization;Passive ROM    Soft tissue mobilization  Gentle STW/M x 11 minutes to right shoulder.    Passive ROM  In supine:  AAROM x 6 minutes to patient's right shoulder.                  PT Long Term Goals - 12/12/19 1359      PT LONG  TERM GOAL #1   Title  Independent with a HEP.    Time  6    Period  Weeks    Status  On-going      PT LONG TERM GOAL #  2   Title  pt will improve her R shoulder flexion to >/= 115 degrees.    Time  8    Period  Weeks    Status  On-going   AROM 70 degrees 12/12/19     PT LONG TERM GOAL #3   Title  Pt will report improved sleep by 50%.    Baseline  MET 11/21/19    Time  6    Period  Weeks    Status  Achieved      PT LONG TERM GOAL #4   Title  Pt will be able to report pain </= 2/10 with basic ADL's within protocol guidelines.    Baseline  6/10 at rest, unable to perform ADL's without assistance    Time  12    Period  Weeks    Status  On-going   unable to perfom ADL's due to pain 12/12/19     PT LONG TERM GOAL #5   Title  pt will improve AROM to >/= 150 degrees flexion and ER to >/= 60 degrees.    Time  12    Period  Weeks    Status  On-going            Plan - 12/26/19 1516    Clinical Impression Statement  Patient reporting "catching" and "grabbing" in right shoulder.  Pain localized near right ACJ and acromial ridge and she had some middle deltoid tenderness.  This is prohibiting her from performing anti-gravity movements with her right shoulder.    Personal Factors and Comorbidities  Comorbidity 3+    Comorbidities  DM, CHF, bipolar CKD, anxiety, depression, barrett esophagus, COPD, L TKA, L knee arthroscopy, cardiac cath, wrist flexion tendenodesis    Examination-Activity Limitations  Carry;Dressing;Sleep;Reach Overhead;Lift    Examination-Participation Restrictions  Cleaning;Community Activity;Driving;Laundry;Other    Stability/Clinical Decision Making  Stable/Uncomplicated    Rehab Potential  Good    PT Frequency  2x / week    PT Duration  12 weeks    PT Treatment/Interventions  ADLs/Self Care Home Management;Cryotherapy;Electrical Stimulation;Moist Heat;Ultrasound;Functional mobility training;Stair training;Therapeutic activities;Therapeutic exercise;Neuromuscular  re-education;Manual techniques;Taping;Vasopneumatic Device;Iontophoresis 37m/ml Dexamethasone    PT Next Visit Plan  Begin Iontophoresis when cert is signed. and cont with rhythmic stabs and isometrics and gentle progression per protocol (9 weeks 12/12/19)    PT Home Exercise Plan  see pt instructions    Consulted and Agree with Plan of Care  Patient       Patient will benefit from skilled therapeutic intervention in order to improve the following deficits and impairments:  Pain, Postural dysfunction, Impaired UE functional use, Decreased strength, Decreased range of motion, Increased edema  Visit Diagnosis: No diagnosis found.     Problem List Patient Active Problem List   Diagnosis Date Noted  . Cervical disc herniation 01/31/2019  . Stomatitis 07/03/2018  . TMJ (sprain of temporomandibular joint), initial encounter 07/03/2018  . Tremor, essential 03/20/2017  . Syncope and collapse 03/20/2017  . AF (atrial fibrillation) ([place] 08/03/2016  . Hypokalemia 02/25/2016  . Anxiety 02/24/2016  . Stroke-like symptoms 02/24/2016  . Weakness 01/26/2016  . Atrial fibrillation ([place] 01/26/2016  . History of colon polyps 01/28/2015  . Dysphagia 01/28/2015  . History of esophageal stricture 01/28/2015  . Primary osteoarthritis of right knee 06/24/2014  . Acute systolic heart failure ([place] 06/23/2014  . Takotsubo syndrome 06/21/2014  . Elevated troponin 06/20/2014  . Atrial fibrillation with RVR ([place] 06/20/2014  . Congestive dilated cardiomyopathy ([place] 06/20/2014  . Demand  ischemia of myocardium ([place]) 06/20/2014  . 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 Respiratory failure requiring intubation ([place]) 06/19/2014  . Acute pulmonary edema ([place]) 06/19/2014  . Acute respiratory failure with hypoxia ([place]) 06/19/2014  . Hypoxemia   . Arterial hypotension   . Gait instability 10/26/2013  . Stroke ([place]) 10/26/2013  . TIA (transient ischemic attack) 10/26/2013  . Tobacco user  05/20/2013  . Postoperative anemia due to acute blood loss 01/18/2013  . Osteoarthritis of left knee 01/17/2013  . Essential hypertension 12/07/2012  . Peripheral edema 12/07/2012  . Diabetes mellitus with complication ([place]) 56/25/6389  . COPD mixed type ([place]) 08/05/2012  . Seasonal and perennial allergic rhinitis 08/05/2012  . Urticaria 08/05/2012  . Chest pain 02/06/2012  . Hyperlipidemia 02/06/2012    Nilan Iddings, [place] MPT 12/26/2019, 3:18 PM  [facility] [address] [place], [place], [address] Phone: [phone]   Fax:  [phone]  Name: [name] [name] MRN: [id number] Date of Birth: 1951-03-15

## 2019-12-31 ENCOUNTER — Encounter: Payer: Self-pay | Admitting: Physical Therapy

## 2019-12-31 ENCOUNTER — Ambulatory Visit: Payer: Medicare Other | Attending: Orthopedic Surgery | Admitting: Physical Therapy

## 2019-12-31 ENCOUNTER — Other Ambulatory Visit: Payer: Self-pay

## 2019-12-31 DIAGNOSIS — M25611 Stiffness of right shoulder, not elsewhere classified: Secondary | ICD-10-CM

## 2019-12-31 DIAGNOSIS — M6281 Muscle weakness (generalized): Secondary | ICD-10-CM | POA: Diagnosis present

## 2019-12-31 DIAGNOSIS — M25511 Pain in right shoulder: Secondary | ICD-10-CM | POA: Diagnosis present

## 2019-12-31 NOTE — Therapy (Addendum)
[facility] [place], [place], [address] Phone: [phone]   Fax:  [phone]  Physical Therapy Treatment PHYSICAL THERAPY DISCHARGE SUMMARY  Visits from Start of Care: 19  Current functional level related to goals / functional outcomes: See below   Remaining deficits: See goals   Education / Equipment: HEP  Plan: Patient agrees to discharge.  Patient goals were not met. Patient is being discharged due to not returning since the last visit.  ?????  Gabriela Eves, PT, DPT 05/27/20   Patient Details  Name: [name] [name] MRN: [id number] Date of Birth: October 17, 1950 Referring Provider (PT): Dr. Justice Britain   Encounter Date: 12/31/2019  PT End of Session - 12/31/19 1815    Visit Number  19    Number of Visits  24    Date for PT Re-Evaluation  01/24/20    PT Start Time  1430    PT Stop Time  1518    PT Time Calculation (min)  48 min    Activity Tolerance  Patient tolerated treatment well    Behavior During Therapy  [facility] for tasks assessed/performed       Past Medical History:  Diagnosis Date  . Anemia    hx of years ago   . Anxiety   . Aortic valve disorders   . Arthritis   . Asthma   . Barrett esophagus   . Bipolar disorder ([place])   . CHF (congestive heart failure) ([place])   . Chronic bronchitis ([place])   . Chronic kidney disease    "I don't have but 1" (08/03/2016)  . Complication of anesthesia   . COPD (chronic obstructive pulmonary disease) ([place])    "just have a touch" (08/03/2016)  . Depression   . Esophageal reflux   . Esophageal stricture   . Family history of adverse reaction to anesthesia    brother had nausea and vomiting, mother has had nausea  . Fibromyalgia    "hands, knees, elbows; back; think it's in my hips" (08/03/2016)  . GAD (generalized anxiety disorder)   . GERD (gastroesophageal reflux disease)   . Hiatal hernia   . History of blood transfusion 1970s; 01/08/2013   "low blood count"   . History of kidney stones 01-08-13   past hx.  . History of nuclear stress test 01/2016   low risk study  . History of stomach ulcers    "some have bleed"  . Hyperlipemia   . Hypertension 12/07/2012  . Insomnia, unspecified   . Myocardial infarction [facility]) 01-08-13   '06-Chest pain-no stent-dx. MI-stress related  . Osteoporosis   . Personal history of colonic polyps 09/2010   TUBULAR ADENOMAS (X3); NEGATIVE FOR HIGH GRADE DYSPLASIA OR MALIGNANCY.  [name] Pneumonia    "several times" (08/03/2016)  . PONV (postoperative nausea and vomiting)   . Stroke [facility])    "2 or 3"; remains with some right sided weaknes (08/03/2016)  . Takotsubo syndrome   . Tremor, essential 03/20/2017  . Type II diabetes mellitus ([place])   . Urinary frequency    AND INCONTINENCE    Past Surgical History:  Procedure Laterality Date  . ANTERIOR CERVICAL DECOMP/DISCECTOMY FUSION N/A 01/31/2019   Procedure: ANTERIOR CERVICAL DECOMPRESSION/DISCECTOMY FUSION C5-6;  Surgeon: Melina Schools, MD;  Location: [place];  Service: Orthopedics;  Laterality: N/A;  3 hrs  . ATRIAL FIBRILLATION ABLATION  08/03/2016  . CARDIAC CATHETERIZATION     normal per Dr Acie Fredrickson in note dated 02/06/12   . CARDIAC CATHETERIZATION     "  I've had 2 or 3" (08/03/2016)  . CARPAL TUNNEL RELEASE Right   . CATARACT EXTRACTION W/ INTRAOCULAR LENS  IMPLANT, BILATERAL    . COLONOSCOPY W/ BIOPSIES AND POLYPECTOMY    . DILATION AND CURETTAGE OF UTERUS     S/P miscarriage  . ELECTROPHYSIOLOGIC STUDY N/A 08/03/2016   Procedure: Atrial Fibrillation Ablation;  Surgeon: Will Meredith Leeds, MD;  Location: [place] [facility];  Service: Cardiovascular;  Laterality: N/A;  . ESOPHAGEAL DILATION  01-08-13   2 yrs ago  . HEEL SPUR SURGERY Left 2005  . KNEE ARTHROSCOPY  02/28/2012   Procedure: ARTHROSCOPY KNEE;  Surgeon: Tobi Bastos, MD;  Location: [facility];  Service: Orthopedics;  Laterality: Left;  . LAPAROSCOPIC CHOLECYSTECTOMY  03/2002  . REVERSE SHOULDER ARTHROPLASTY Right  10/10/2019   Procedure: REVERSE SHOULDER ARTHROPLASTY;  Surgeon: Justice Britain, MD;  Location: [facility];  Service: Orthopedics;  Laterality: Right;  131mn  . SHOULDER OPEN ROTATOR CUFF REPAIR Right 2011  . TONSILLECTOMY AND ADENOIDECTOMY  1965  . TOTAL KNEE ARTHROPLASTY Left 01/17/2013   Procedure: LEFT TOTAL KNEE ARTHROPLASTY;  Surgeon: RTobi Bastos MD;  Location: [facility];  Service: Orthopedics;  Laterality: Left;  . TOTAL KNEE ARTHROPLASTY Right 06/19/2014   Procedure: RIGHT TOTAL KNEE ARTHROPLASTY;  Surgeon: RTobi Bastos MD;  Location: [facility];  Service: Orthopedics;  Laterality: Right;  . TUBAL LIGATION    . VAGINAL HYSTERECTOMY     "partial"  . WRIST FLEXION TENDON TENOTOMIES AND PROXIMAL CORPECTOMY W/ WRIST ARTHRODESIS&ILIAC CREST BONE GRAFT      There were no vitals filed for this visit.  Subjective Assessment - 12/31/19 1440    Subjective  COVID-19 screen performed prior to patient entering clinic. Patient reports shoulder is giving her a fit.    Pertinent History  R reverse shoulder arthroplasty 10/10/2019    Patient Stated Goals  "Be able to use my arm again"    Currently in Pain?  Yes    Pain Score  4     Pain Location  Shoulder    Pain Orientation  Right    Pain Descriptors / Indicators  Aching    Pain Type  Surgical pain    Pain Onset  More than a month ago    Pain Frequency  Intermittent         [facility] PT Assessment - 12/31/19 0001      Assessment   Medical Diagnosis  R reverse shoulder arthoplasty    Referring Provider (PT)  Dr. KJustice Britain   Onset Date/Surgical Date  10/10/19    Hand Dominance  Right    Next MD Visit  01/01/2020      AROM   Right Shoulder Flexion  124 Degrees      PROM   Right Shoulder Flexion  134 Degrees    Right Shoulder External Rotation  65 Degrees                    [facility] Adult PT Treatment/Exercise - 12/31/19 0001      Exercises   Exercises  Shoulder      Shoulder Exercises: Standing   Other Standing  Exercises  Wall ladder x 4 minutes.    Other Standing Exercises  Cone lifts into cabinet, attempted but pain, exercise terminated      Modalities   Modalities  Electrical Stimulation;Vasopneumatic      Electrical Stimulation   Electrical Stimulation Location  RT ACJ/middle deltoid.    EDealer  Stimulation Action  pre-mod    Electrical Stimulation Parameters  80-150 hz x15 mins    Electrical Stimulation Goals  Pain      Vasopneumatic   Number Minutes Vasopneumatic   15 minutes    Vasopnuematic Location   Shoulder    Vasopneumatic Pressure  Low      Manual Therapy   Manual Therapy  Soft tissue mobilization;Passive ROM    Soft tissue mobilization  Gentle STW/M to right shoulder to decrease pain    Passive ROM  PROM to right shoulder in flexion, ER, and IR, intermittent oscillations to decrease pain                  PT Long Term Goals - 12/12/19 1359      PT LONG TERM GOAL #1   Title  Independent with a HEP.    Time  6    Period  Weeks    Status  On-going      PT LONG TERM GOAL #2   Title  pt will improve her R shoulder flexion to >/= 115 degrees.    Time  8    Period  Weeks    Status  On-going   AROM 70 degrees 12/12/19     PT LONG TERM GOAL #3   Title  Pt will report improved sleep by 50%.    Baseline  MET 11/21/19    Time  6    Period  Weeks    Status  Achieved      PT LONG TERM GOAL #4   Title  Pt will be able to report pain </= 2/10 with basic ADL's within protocol guidelines.    Baseline  6/10 at rest, unable to perform ADL's without assistance    Time  12    Period  Weeks    Status  On-going   unable to perfom ADL's due to pain 12/12/19     PT LONG TERM GOAL #5   Title  pt will improve AROM to >/= 150 degrees flexion and ER to >/= 60 degrees.    Time  12    Period  Weeks    Status  On-going            Plan - 12/31/19 1636    Clinical Impression Statement  Patient arrives with ongoing pain in right shoulder with catching and grabbing  near right ACJ. Patient limited with right shoulder flexion AROM with cone lift to cabinet therefore exercise terminated. Patient very tender to palpation to R AC joint and middle delotid region during STW/M. Patient and PT discussed taking more rest this week to help alleviate pain. ROM measured, see objective measures. Patient to see MD tomorrow, 01/01/2020 for follow up visit.    Personal Factors and Comorbidities  Comorbidity 3+    Comorbidities  DM, CHF, bipolar CKD, anxiety, depression, barrett esophagus, COPD, L TKA, L knee arthroscopy, cardiac cath, wrist flexion tendenodesis    Examination-Activity Limitations  Carry;Dressing;Sleep;Reach Overhead;Lift    Examination-Participation Restrictions  Cleaning;Community Activity;Driving;Laundry;Other    Stability/Clinical Decision Making  Stable/Uncomplicated    Clinical Decision Making  Low    Rehab Potential  Good    PT Frequency  2x / week    PT Duration  12 weeks    PT Treatment/Interventions  ADLs/Self Care Home Management;Cryotherapy;Electrical Stimulation;Moist Heat;Ultrasound;Functional mobility training;Stair training;Therapeutic activities;Therapeutic exercise;Neuromuscular re-education;Manual techniques;Taping;Vasopneumatic Device;Iontophoresis 62m/ml Dexamethasone    PT Next Visit Plan  Begin Iontophoresis when cert is signed. and cont with  rhythmic stabs and isometrics and gentle progression per protocol (9 weeks 12/12/19)    PT Home Exercise Plan  see pt instructions    Consulted and Agree with Plan of Care  Patient       Patient will benefit from skilled therapeutic intervention in order to improve the following deficits and impairments:  Pain, Postural dysfunction, Impaired UE functional use, Decreased strength, Decreased range of motion, Increased edema  Visit Diagnosis: Stiffness of right shoulder, not elsewhere classified  Acute pain of right shoulder  Muscle weakness (generalized)     Problem List Patient Active  Problem List   Diagnosis Date Noted  . Cervical disc herniation 01/31/2019  . Stomatitis 07/03/2018  . TMJ (sprain of temporomandibular joint), initial encounter 07/03/2018  . Tremor, essential 03/20/2017  . Syncope and collapse 03/20/2017  . AF (atrial fibrillation) ([place]) 08/03/2016  . Hypokalemia 02/25/2016  . Anxiety 02/24/2016  . Stroke-like symptoms 02/24/2016  . Weakness 01/26/2016  . Atrial fibrillation ([place]) 01/26/2016  . History of colon polyps 01/28/2015  . Dysphagia 01/28/2015  . History of esophageal stricture 01/28/2015  . Primary osteoarthritis of right knee 06/24/2014  . Acute systolic heart failure ([place]) 06/23/2014  . Takotsubo syndrome 06/21/2014  . Elevated troponin 06/20/2014  . Atrial fibrillation with RVR ([place]) 06/20/2014  . Congestive dilated cardiomyopathy ([place]) 06/20/2014  . Demand ischemia of myocardium ([place]) 06/20/2014  . 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 Respiratory failure requiring intubation ([place]) 06/19/2014  . Acute pulmonary edema ([place]) 06/19/2014  . Acute respiratory failure with hypoxia ([place]) 06/19/2014  . Hypoxemia   . Arterial hypotension   . Gait instability 10/26/2013  . Stroke ([place]) 10/26/2013  . TIA (transient ischemic attack) 10/26/2013  . Tobacco user 05/20/2013  . Postoperative anemia due to acute blood loss 01/18/2013  . Osteoarthritis of left knee 01/17/2013  . Essential hypertension 12/07/2012  . Peripheral edema 12/07/2012  . Diabetes mellitus with complication ([place]) 42/68/3419  . COPD mixed type ([place]) 08/05/2012  . Seasonal and perennial allergic rhinitis 08/05/2012  . Urticaria 08/05/2012  . Chest pain 02/06/2012  . Hyperlipidemia 02/06/2012    Gabriela Eves, PT, DPT 12/31/2019, 6:18 PM  [facility] [address] [place], [place], [address] Phone: [phone]   Fax:  [phone]  Name: [name] [name] MRN: [id number] Date of Birth: Sep 11, 1950

## 2020-01-02 ENCOUNTER — Encounter: Payer: Medicare Other | Admitting: Physical Therapy

## 2020-01-27 ENCOUNTER — Ambulatory Visit: Payer: Medicare Other | Admitting: Internal Medicine

## 2020-01-30 ENCOUNTER — Other Ambulatory Visit: Payer: Self-pay | Admitting: Cardiology

## 2020-01-30 NOTE — Telephone Encounter (Signed)
Eliquis 5mg  refill request received. Patient is 69 years old, weight-74.6kg, Crea-0.67 on 10/03/2019, Diagnosis-Afib, and last seen by Dr. Curt Bears on 08/16/2019. Dose is appropriate based on dosing criteria. Will send in refill to requested pharmacy.

## 2020-03-02 ENCOUNTER — Ambulatory Visit: Payer: Medicare Other | Admitting: Internal Medicine

## 2020-03-10 ENCOUNTER — Other Ambulatory Visit: Payer: Self-pay

## 2020-03-10 MED ORDER — LOSARTAN POTASSIUM 25 MG PO TABS: 12.5000 mg | ORAL_TABLET | Freq: Every day | ORAL | 1 refills | Status: DC

## 2020-03-10 MED ORDER — ISOSORBIDE MONONITRATE ER 30 MG PO TB24: 30.0000 mg | ORAL_TABLET | Freq: Every day | ORAL | 1 refills | Status: DC

## 2020-04-09 ENCOUNTER — Ambulatory Visit: Payer: Medicare Other | Attending: Internal Medicine

## 2020-04-09 DIAGNOSIS — Z23 Encounter for immunization: Secondary | ICD-10-CM

## 2020-04-09 NOTE — Progress Notes (Signed)
   Covid-19 Vaccination Clinic  Name:  SHARMAN GARROTT    MRN: [id number] DOB: 20-Oct-1950  04/09/2020  Ms. Delapena was observed post Covid-19 immunization for 15 minutes without incident. She was provided with Vaccine Information Sheet and instruction to access the V-Safe system.   Ms. Coppinger was instructed to call 911 with any severe reactions post vaccine: [name] Difficulty breathing  . Swelling of face and throat  . A fast heartbeat  . A bad rash all over body  . Dizziness and weakness   Immunizations Administered    Name Date Dose VIS Date Route   Pfizer COVID-19 Vaccine 04/09/2020  2:11 PM 0.3 mL 09/25/2018 Intramuscular   Manufacturer: [place]   Lot: [id number]   [place]: [id number]

## 2020-06-10 ENCOUNTER — Telehealth: Payer: Self-pay | Admitting: Cardiology

## 2020-06-10 NOTE — Telephone Encounter (Signed)
*  STAT* If patient is at the pharmacy, call can be transferred to refill team.   1. Which medications need to be refilled? (please list name of each medication and dose if known) Cozaar, and Cyclobenzaprine  2. Which pharmacy/location (including street and city if local pharmacy) is medication to be sent to? [facility]  3. Do they need a 30 day or 90 day supply? 90 days and refills*

## 2020-06-10 NOTE — Telephone Encounter (Signed)
Called pt to inform her that her medication losartan was sent to her pharmacy with enough medication to last until she makes her appointment with Dr. Curt Bears for January and that she needed to contact her PCP for a refill on cyclobenzaprine. I advised the pt that if she has any other problems, questions or concerns, to give our office a call. Pt verbalized understanding.

## 2020-06-20 ENCOUNTER — Other Ambulatory Visit: Payer: Self-pay

## 2020-06-20 ENCOUNTER — Encounter (HOSPITAL_COMMUNITY): Payer: Self-pay | Admitting: Emergency Medicine

## 2020-06-20 ENCOUNTER — Emergency Department (HOSPITAL_COMMUNITY)
Admission: EM | Admit: 2020-06-20 | Discharge: 2020-06-20 | Disposition: A | Discharge status: Home / Self Care | Payer: Medicare Other | Attending: Emergency Medicine | Admitting: Emergency Medicine

## 2020-06-20 ENCOUNTER — Emergency Department (HOSPITAL_COMMUNITY): Payer: Medicare Other

## 2020-06-20 DIAGNOSIS — E1122 Type 2 diabetes mellitus with diabetic chronic kidney disease: Secondary | ICD-10-CM | POA: Diagnosis not present

## 2020-06-20 DIAGNOSIS — I4891 Unspecified atrial fibrillation: Secondary | ICD-10-CM | POA: Diagnosis not present

## 2020-06-20 DIAGNOSIS — W010XXA Fall on same level from slipping, tripping and stumbling without subsequent striking against object, initial encounter: Secondary | ICD-10-CM | POA: Diagnosis not present

## 2020-06-20 DIAGNOSIS — S0990XA Unspecified injury of head, initial encounter: Secondary | ICD-10-CM | POA: Insufficient documentation

## 2020-06-20 DIAGNOSIS — I509 Heart failure, unspecified: Secondary | ICD-10-CM | POA: Diagnosis not present

## 2020-06-20 DIAGNOSIS — N189 Chronic kidney disease, unspecified: Secondary | ICD-10-CM | POA: Insufficient documentation

## 2020-06-20 DIAGNOSIS — Z87891 Personal history of nicotine dependence: Secondary | ICD-10-CM | POA: Diagnosis not present

## 2020-06-20 DIAGNOSIS — Z96653 Presence of artificial knee joint, bilateral: Secondary | ICD-10-CM | POA: Insufficient documentation

## 2020-06-20 DIAGNOSIS — Z7901 Long term (current) use of anticoagulants: Secondary | ICD-10-CM | POA: Diagnosis not present

## 2020-06-20 DIAGNOSIS — I130 Hypertensive heart and chronic kidney disease with heart failure and stage 1 through stage 4 chronic kidney disease, or unspecified chronic kidney disease: Secondary | ICD-10-CM | POA: Insufficient documentation

## 2020-06-20 DIAGNOSIS — Z79899 Other long term (current) drug therapy: Secondary | ICD-10-CM | POA: Insufficient documentation

## 2020-06-20 DIAGNOSIS — J45909 Unspecified asthma, uncomplicated: Secondary | ICD-10-CM | POA: Insufficient documentation

## 2020-06-20 DIAGNOSIS — J449 Chronic obstructive pulmonary disease, unspecified: Secondary | ICD-10-CM | POA: Diagnosis not present

## 2020-06-20 DIAGNOSIS — Z7984 Long term (current) use of oral hypoglycemic drugs: Secondary | ICD-10-CM | POA: Insufficient documentation

## 2020-06-20 MED ORDER — ACETAMINOPHEN 500 MG PO TABS
1000.0000 mg | ORAL_TABLET | Freq: Once | ORAL | Status: AC
  Administered 2020-06-20: 1000 mg via ORAL
  Filled 2020-06-20: qty 2

## 2020-06-20 MED ORDER — PROCHLORPERAZINE MALEATE 10 MG PO TABS
10.0000 mg | ORAL_TABLET | Freq: Once | ORAL | Status: AC
  Administered 2020-06-20: 10 mg via ORAL
  Filled 2020-06-20: qty 1

## 2020-06-20 NOTE — ED Triage Notes (Signed)
Pt reports that she lost her balance and fell. States her sugar went up to 190 and made her dizzy. Large hematoma above and around L eye. Pt is on eliquis. Unable to open L eye. Denies LOC.

## 2020-06-21 NOTE — ED Provider Notes (Signed)
[facility] DEPT Provider Note   CSN: [id number] Arrival date & time: 06/20/20  2040     History Chief Complaint  Patient presents with  . Head Injury    [name] [name] is a 69 y.o. female.  HPI      69yo female with history of CHF, COPD, aortic valve disorder, atrial fibrillation on eliquis, esophageal stricture, MI, gastric ulcers, DM, hypertension, hyperlipidemia, presents with concern for fall.   Reports she was in [place] visiting friends . Her sugar had increased to 190. She walked outside to [place].and lost her balance and fell hitting her head. No LOC.  Has significant swelling and shooting pain on left side of head which is severe. Has not yet had anything for pain. It occurred approximately 2 hours ago, drove back from [place] and presented due to pain and swelling. Swelling so severe she cannot open left eye however otherwise denies visual changes. No neck pain, no back pain, no numbness or weakness, no nausea/vomiting, no difficulty walking. Has hx of shoulder pain which is unchanged since fall.  No chest pain/dyspnea.  Is on antibiotics for UTI but otherwise denies any acute medical concerns. No dizziness, no fevers.   Past Medical History:  Diagnosis Date  . Anemia    hx of years ago   . Anxiety   . Aortic valve disorders   . Arthritis   . Asthma   . Barrett esophagus   . Bipolar disorder ([place])   . CHF (congestive heart failure) ([place])   . Chronic bronchitis ([place])   . Chronic kidney disease    "I don't have but 1" (08/03/2016)  . Complication of anesthesia   . COPD (chronic obstructive pulmonary disease) ([place])    "just have a touch" (08/03/2016)  . Depression   . Esophageal reflux   . Esophageal stricture   . Family history of adverse reaction to anesthesia    brother had nausea and vomiting, mother has had nausea  . Fibromyalgia    "hands, knees, elbows; back; think it's in my hips" (08/03/2016)  . GAD (generalized anxiety disorder)    . GERD (gastroesophageal reflux disease)   . Hiatal hernia   . History of blood transfusion 1970s; 01/08/2013   "low blood count"  . History of kidney stones 01-08-13   past hx.  . History of nuclear stress test 01/2016   low risk study  . History of stomach ulcers    "some have bleed"  . Hyperlipemia   . Hypertension 12/07/2012  . Insomnia, unspecified   . Myocardial infarction [facility]) 01-08-13   '06-Chest pain-no stent-dx. MI-stress related  . Osteoporosis   . Personal history of colonic polyps 09/2010   TUBULAR ADENOMAS (X3); NEGATIVE FOR HIGH GRADE DYSPLASIA OR MALIGNANCY.  [name] Pneumonia    "several times" (08/03/2016)  . PONV (postoperative nausea and vomiting)   . Stroke [facility])    "2 or 3"; remains with some right sided weaknes (08/03/2016)  . Takotsubo syndrome   . Tremor, essential 03/20/2017  . Type II diabetes mellitus ([place])   . Urinary frequency    AND INCONTINENCE    Patient Active Problem List   Diagnosis Date Noted  . Cervical disc herniation 01/31/2019  . Stomatitis 07/03/2018  . TMJ (sprain of temporomandibular joint), initial encounter 07/03/2018  . Tremor, essential 03/20/2017  . Syncope and collapse 03/20/2017  . AF (atrial fibrillation) ([place]) 08/03/2016  . Hypokalemia 02/25/2016  . Anxiety 02/24/2016  . Stroke-like symptoms 02/24/2016  .  Weakness 01/26/2016  . Atrial fibrillation ([place]) 01/26/2016  . History of colon polyps 01/28/2015  . Dysphagia 01/28/2015  . History of esophageal stricture 01/28/2015  . Primary osteoarthritis of right knee 06/24/2014  . Acute systolic heart failure ([place]) 06/23/2014  . Takotsubo syndrome 06/21/2014  . Elevated troponin 06/20/2014  . Atrial fibrillation with RVR ([place]) 06/20/2014  . Congestive dilated cardiomyopathy ([place]) 06/20/2014  . Demand ischemia of myocardium ([place]) 06/20/2014  . 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 Respiratory failure requiring intubation ([place]) 06/19/2014  . Acute pulmonary edema  ([place]) 06/19/2014  . Acute respiratory failure with hypoxia ([place]) 06/19/2014  . Hypoxemia   . Arterial hypotension   . Gait instability 10/26/2013  . Stroke ([place]) 10/26/2013  . TIA (transient ischemic attack) 10/26/2013  . Tobacco user 05/20/2013  . Postoperative anemia due to acute blood loss 01/18/2013  . Osteoarthritis of left knee 01/17/2013  . Essential hypertension 12/07/2012  . Peripheral edema 12/07/2012  . Diabetes mellitus with complication ([place]) 50/53/9767  . COPD mixed type ([place]) 08/05/2012  . Seasonal and perennial allergic rhinitis 08/05/2012  . Urticaria 08/05/2012  . Chest pain 02/06/2012  . Hyperlipidemia 02/06/2012    Past Surgical History:  Procedure Laterality Date  . ANTERIOR CERVICAL DECOMP/DISCECTOMY FUSION N/A 01/31/2019   Procedure: ANTERIOR CERVICAL DECOMPRESSION/DISCECTOMY FUSION C5-6;  Surgeon: Melina Schools, MD;  Location: [facility];  Service: Orthopedics;  Laterality: N/A;  3 hrs  . ATRIAL FIBRILLATION ABLATION  08/03/2016  . CARDIAC CATHETERIZATION     normal per Dr Acie Fredrickson in note dated 02/06/12   . CARDIAC CATHETERIZATION     "I've had 2 or 3" (08/03/2016)  . CARPAL TUNNEL RELEASE Right   . CATARACT EXTRACTION W/ INTRAOCULAR LENS  IMPLANT, BILATERAL    . COLONOSCOPY W/ BIOPSIES AND POLYPECTOMY    . DILATION AND CURETTAGE OF UTERUS     S/P miscarriage  . ELECTROPHYSIOLOGIC STUDY N/A 08/03/2016   Procedure: Atrial Fibrillation Ablation;  Surgeon: Will Meredith Leeds, MD;  Location: [facility];  Service: Cardiovascular;  Laterality: N/A;  . ESOPHAGEAL DILATION  01-08-13   2 yrs ago  . HEEL SPUR SURGERY Left 2005  . KNEE ARTHROSCOPY  02/28/2012   Procedure: ARTHROSCOPY KNEE;  Surgeon: Tobi Bastos, MD;  Location: [facility];  Service: Orthopedics;  Laterality: Left;  . LAPAROSCOPIC CHOLECYSTECTOMY  03/2002  . REVERSE SHOULDER ARTHROPLASTY Right 10/10/2019   Procedure: REVERSE SHOULDER ARTHROPLASTY;  Surgeon: Justice Britain, MD;  Location: [facility];   Service: Orthopedics;  Laterality: Right;  110min  . SHOULDER OPEN ROTATOR CUFF REPAIR Right 2011  . TONSILLECTOMY AND ADENOIDECTOMY  1965  . TOTAL KNEE ARTHROPLASTY Left 01/17/2013   Procedure: LEFT TOTAL KNEE ARTHROPLASTY;  Surgeon: Tobi Bastos, MD;  [facility];  Service: Orthopedics;  Laterality: Left;  . TOTAL KNEE ARTHROPLASTY Right 06/19/2014   Procedure: RIGHT TOTAL KNEE ARTHROPLASTY;  Surgeon: Tobi Bastos, MD;  Location[facility];  Service: Orthopedics;  Laterality: Right;  . TUBAL LIGATION    . VAGINAL HYSTERECTOMY     "partial"  . WRIST FLEXION TENDON TENOTOMIES AND PROXIMAL CORPECTOMY W/ WRIST ARTHRODESIS&ILIAC CREST BONE GRAFT       OB History   No obstetric history on file.     Family History  Problem Relation Age of Onset  . Breast cancer Mother   . Stroke Mother   . Transient ischemic attack Mother   . Colon cancer Brother 22  . Colon polyps Brother   .  Heart failure Brother   . Heart attack Father   . Heart failure Father   . Diabetes Maternal Grandfather   . Kidney disease Other        Both sides of family  . Heart disease Other        Both sides of family  . Breast cancer Maternal Aunt   . Breast cancer Maternal Grandmother     Social History   Tobacco Use  . Smoking status: Former Smoker    Packs/day: 0.50    Years: 50.00    Pack years: 25.00    Types: Cigarettes    Quit date: 08/03/2019    Years since quitting: 0.8  . Smokeless tobacco: Former Systems developer    Types: Snuff, Chew    Quit date: 12/31/2011  . Tobacco comment: 01/24/2019 pt reports smoking 6-7 cigs some days and 1 pack other days  Vaping Use  . Vaping Use: Never used  Substance Use Topics  . Alcohol use: Yes    Comment: 08/03/2016 "maybe 1 beer/month; a shot of rum" maybe twice/year"  . Drug use: No    Home Medications Prior to Admission medications   Medication Sig Start Date End Date Taking? Authorizing Provider  albuterol (PROVENTIL) (2.5 MG/3ML) 0.083% nebulizer solution  Take 3 mLs (2.5 mg total) by nebulization every 6 (six) hours as needed for wheezing. DX  491.9, J44.9 01/24/19  Yes Young, Tarri Fuller D, MD  ALPRAZolam Duanne Moron) 1 MG tablet Take 1 mg by mouth at bedtime.    Yes [provider]  atorvastatin (LIPITOR) 80 MG tablet Take 80 mg by mouth at bedtime.    Yes [provider]  azelastine (ASTELIN) 0.1 % nasal spray Place 1 spray into both nostrils at bedtime.    Yes [provider]  beta carotene 25000 UNIT capsule Take 25,000 Units by mouth daily.   Yes [provider]  cholecalciferol (VITAMIN D) 1000 UNITS tablet Take 1,000 Units by mouth daily.    Yes [provider]  cyclobenzaprine (FLEXERIL) 10 MG tablet Take 1 tablet (10 mg total) by mouth 3 (three) times daily as needed for muscle spasms. 10/10/19  Yes Shuford, Olivia Mackie, PA-C  dexlansoprazole (DEXILANT) 60 MG capsule Take 60 mg by mouth daily.   Yes [provider]  docusate sodium (COLACE) 100 MG capsule Take 100 mg by mouth 2 (two) times daily.   Yes [provider]  ELIQUIS 5 MG TABS tablet TAKE 1 TABLET BY MOUTH TWICE DAILY Patient taking differently: Take 5 mg by mouth 2 (two) times daily.  01/30/20  Yes Camnitz, Will Hassell Done, MD  EPINEPHrine (EPIPEN 2-PAK) 0.3 mg/0.3 mL IJ SOAJ injection USE AS DIRECTED Patient taking differently: Inject 0.3 mg into the muscle as needed for anaphylaxis. USE AS DIRECTED 01/24/19  Yes Young, Clinton D, MD  Flaxseed, Linseed, (FLAXSEED OIL) 1200 MG CAPS Take 1,200 mg by mouth at bedtime.   Yes [provider]  furosemide (LASIX) 20 MG tablet TAKE 1 TABLET BY MOUTH EVERY DAY Patient taking differently: Take 20 mg by mouth daily.  09/17/19  Yes Camnitz, Ocie Doyne, MD  gabapentin (NEURONTIN) 300 MG capsule Take 300-600 mg by mouth 3 (three) times daily as needed (pain).    Yes [provider]  glipiZIDE (GLUCOTROL) 5 MG tablet Take 5 mg by mouth 2 (two) times daily before a meal.   Yes [provider]  isosorbide mononitrate (IMDUR) 30 MG 24 hr tablet Take 1 tablet (30 mg total) by  mouth daily. 03/10/20  Yes Camnitz, Will Hassell Done, MD  losartan (COZAAR) 25 MG tablet Take 0.5 tablets (12.5 mg total) by mouth at bedtime. 03/10/20  Yes Camnitz, Will Hassell Done, MD  montelukast (SINGULAIR) 10 MG tablet Take 1 tablet (10 mg total) by mouth at bedtime. Patient taking differently: Take 10 mg by mouth daily.  05/15/14  Yes Young, Tarri Fuller D, MD  PARoxetine (PAXIL) 30 MG tablet Take 30 mg by mouth at bedtime.  01/11/18  Yes [provider]  PROAIR HFA 108 (90 Base) MCG/ACT inhaler INHALE TWO PUFFS INTO THE LUNGS EVERY 6 HOURS AS NEEDED FOR WHEEZING OR SHORTNESS OF BREATH Patient taking differently: Inhale 2 puffs into the lungs every 6 (six) hours as needed for wheezing or shortness of breath.  12/14/17  Yes Young, Tarri Fuller D, MD  traMADol (ULTRAM) 50 MG tablet Take 1 tablet (50 mg total) by mouth every 6 (six) hours as needed. Patient taking differently: Take 50 mg by mouth every 6 (six) hours as needed for moderate pain.  10/10/19  Yes Shuford, Olivia Mackie, PA-C  vitamin B-12 (CYANOCOBALAMIN) 1000 MCG tablet Take 1,000 mcg by mouth every evening.    Yes [provider]  ondansetron (ZOFRAN) 4 MG tablet Take 1 tablet (4 mg total) by mouth every 8 (eight) hours as needed for nausea or vomiting. Patient not taking: Reported on 06/20/2020 01/31/19   Melina Schools, MD  ondansetron (ZOFRAN) 4 MG tablet Take 1 tablet (4 mg total) by mouth every 8 (eight) hours as needed for nausea or vomiting. Patient not taking: Reported on 06/20/2020 10/10/19   Shuford, Olivia Mackie, PA-C  oxyCODONE-acetaminophen (PERCOCET) 5-325 MG tablet Take 1 tablet by mouth every 4 (four) hours as needed for severe pain (max 6 q). Patient not taking: Reported on 06/20/2020 10/10/19   Shuford, Olivia Mackie, PA-C    Allergies    Bee venom, Ceclor [cefaclor], Penicillins, Pneumococcal vaccines, Codeine, Cortisone, and Boniva [ibandronate  sodium]  Review of Systems   Review of Systems  Constitutional: Negative for fever.  HENT: Negative for sore throat.   Eyes: Negative for visual disturbance.  Respiratory: Negative for cough and shortness of breath.   Cardiovascular: Negative for chest pain.  Gastrointestinal: Negative for abdominal pain, nausea and vomiting.  Genitourinary: Negative for difficulty urinating.  Musculoskeletal: Negative for back pain and neck pain.  Skin: Negative for rash.  Neurological: Positive for headaches. Negative for syncope, speech difficulty, weakness and numbness.    Physical Exam Updated Vital Signs BP (!) 142/85   Pulse 65   Temp 97.7 F (36.5 C) (Oral)   Resp 16   Ht 5\' 1"  (1.549 m)   Wt 69.4 kg   SpO2 97%   BMI 28.91 kg/m   Physical Exam Vitals and nursing note reviewed.  Constitutional:      General: She is not in acute distress.    Appearance: She is well-developed. She is not diaphoretic.  HENT:     Head: Normocephalic.     Comments: Large hematoma supraorbital with periorbital swelling and contusion  Eyes:     Conjunctiva/sclera: Conjunctivae normal.     Pupils: Pupils are equal, round, and reactive to light.     Comments: Unable to open eye voluntarily due to swelling, forced opening with normal pupillary reaction  Cardiovascular:     Rate and Rhythm: Normal rate and regular rhythm.  Pulmonary:     Effort: Pulmonary effort is normal. No respiratory distress.  Chest:     Chest wall: No tenderness.  Abdominal:     General: There is no distension.     Palpations: Abdomen is soft.     Tenderness: There is no abdominal tenderness. There is no guarding.  Musculoskeletal:        General: No tenderness.     Cervical back: Normal range of motion.     Comments: No tenderness C/T/L spine Reports pain to right shoulder which is chronic  Skin:    General: Skin is warm and dry.     Findings: No erythema or rash.  Neurological:     Mental Status: She is alert and  oriented to person, place, and time.     ED Results / Procedures / Treatments   Labs (all labs ordered are listed, but only abnormal results are displayed) Labs Reviewed - No data to display  EKG None  Radiology CT Head Wo Contrast  Result Date: 06/20/2020 CLINICAL DATA:  Fall EXAM: CT HEAD WITHOUT CONTRAST TECHNIQUE: Contiguous axial images were obtained from the base of the skull through the vertex without intravenous contrast. COMPARISON:  None. FINDINGS: Brain: No evidence of acute territorial infarction, hemorrhage, hydrocephalus,extra-axial collection or mass lesion/mass effect. Normal gray-white differentiation. Ventricles are normal in size and contour. Vascular: No hyperdense vessel or unexpected calcification. Skull: The skull is intact. No fracture or focal lesion identified. Sinuses/Orbits: The visualized paranasal sinuses and mastoid air cells are clear. The orbits and globes intact. Other: Soft tissue hematoma seen overlying the left frontal skull measuring 4 cm. Left periorbital soft tissue swelling is seen. Face: Osseous: No acute fracture or other significant osseous abnormality.The nasal bone, mandibles, zygomatic arches and pterygoid plates are intact. Orbits: No fracture identified. Unremarkable appearance of globes and orbits. Sinuses: The visualized paranasal sinuses and mastoid air cells are unremarkable. Soft tissues: Large left frontal soft tissue hematoma and left periorbital soft tissue swelling with a small hematoma seen. Small foci of subcutaneous emphysema seen overlying the left periorbital region. Limited intracranial: No acute findings. IMPRESSION: No acute intracranial abnormality. Large left frontal and periorbital soft tissue hematoma with swelling. No acute facial fracture. Electronically Signed   By: Prudencio Pair M.D.   On: 06/20/2020 21:39   CT Maxillofacial Wo Contrast  Result Date: 06/20/2020 CLINICAL DATA:  Fall EXAM: CT HEAD WITHOUT CONTRAST TECHNIQUE:  Contiguous axial images were obtained from the base of the skull through the vertex without intravenous contrast. COMPARISON:  None. FINDINGS: Brain: No evidence of acute territorial infarction, hemorrhage, hydrocephalus,extra-axial collection or mass lesion/mass effect. Normal gray-white differentiation. Ventricles are normal in size and contour. Vascular: No hyperdense vessel or unexpected calcification. Skull: The skull is intact. No fracture or focal lesion identified. Sinuses/Orbits: The visualized paranasal sinuses and mastoid air cells are clear. The orbits and globes intact. Other: Soft tissue hematoma seen overlying the left frontal skull measuring 4 cm. Left periorbital soft tissue swelling is seen. Face: Osseous: No acute fracture or other significant osseous abnormality.The nasal bone, mandibles, zygomatic arches and pterygoid plates are intact. Orbits: No fracture identified. Unremarkable appearance of globes and orbits. Sinuses: The visualized paranasal sinuses and mastoid air cells are unremarkable. Soft tissues: Large left frontal soft tissue hematoma and left periorbital soft tissue swelling with a small hematoma seen. Small foci of subcutaneous emphysema seen overlying the left periorbital region. Limited intracranial: No acute findings. IMPRESSION: No acute intracranial abnormality. Large left frontal and periorbital soft tissue hematoma with swelling. No acute facial fracture. Electronically Signed   By: Prudencio Pair M.D.   On: 06/20/2020  21:39    Procedures Procedures (including critical care time)  Medications Ordered in ED Medications  acetaminophen (TYLENOL) tablet 1,000 mg (1,000 mg Oral Given 06/20/20 2236)  prochlorperazine (COMPAZINE) tablet 10 mg (10 mg Oral Given 06/20/20 2236)    ED Course  I have reviewed the triage vital signs and the nursing notes.  Pertinent labs & imaging results that were available during my care of the patient were reviewed by me and considered in  my medical decision making (see chart for details).    MDM Rules/Calculators/A&P                          69yo female with history of CHF, COPD, aortic valve disorder, atrial fibrillation on eliquis, esophageal stricture, MI, gastric ulcers, DM, hypertension, hyperlipidemia, presents with concern for fall.   No acute neurologic concerns, no signs of CVA/ Denies medical concerns including no CP/fever/dyspnea  Does report she is being treated for UTI and had a glucose of 190 this evening but no nausea, vomiting, abdominal pain and doubt diabetic emergency or failure of outpt abx.    No neck or back pain, no sign of other acute injuries from fall.  CT head shows no evidence of intracranial hemorrhage or facial fracture. Recommend continued supportive care for large facial hematoma, given compazine and tylenol for headache.  Recommend PCP follow up and discussed reasons to return to the ED in detail.  Final Clinical Impression(s) / ED Diagnoses Final diagnoses:  Injury of head, initial encounter    Rx / DC Orders ED Discharge Orders    None       Gareth Morgan, MD 06/21/20 1148

## 2020-07-30 ENCOUNTER — Other Ambulatory Visit: Payer: Self-pay | Admitting: Cardiology

## 2020-07-30 NOTE — Telephone Encounter (Signed)
Prescription refill request for Eliquis received.  Indication: Afib Last office visit: Camnitz, 08/16/2019 Scr: 10/03/2019,  0.67 Age: 69 yo Weight: 69.4 kg   Prescription refill sent.

## 2020-08-06 ENCOUNTER — Other Ambulatory Visit: Payer: Self-pay | Admitting: Cardiology

## 2020-08-14 ENCOUNTER — Telehealth: Payer: Self-pay | Admitting: Cardiology

## 2020-08-14 MED ORDER — LOSARTAN POTASSIUM 25 MG PO TABS: 12.5000 mg | ORAL_TABLET | Freq: Every day | ORAL | 0 refills | Status: DC

## 2020-08-14 NOTE — Telephone Encounter (Signed)
Pt's medication was sent to pt's pharmacy as requested. Confirmation received.  °

## 2020-08-14 NOTE — Telephone Encounter (Signed)
*  STAT* If patient is at the pharmacy, call can be transferred to refill team.   1. Which medications need to be refilled? (please list name of each medication and dose if known) isosorbide mononitrate (IMDUR) 30 MG 24 hr tablet losartan (COZAAR) 25 MG tablet 2. Which pharmacy/location (including street and city if local pharmacy) is medication to be sent to? [place], [place] - [place]  3. Do they need a 30 day or 90 day supply?  90 day supply

## 2020-09-28 ENCOUNTER — Other Ambulatory Visit: Payer: Self-pay | Admitting: Cardiology

## 2020-10-09 ENCOUNTER — Other Ambulatory Visit: Payer: Self-pay | Admitting: Cardiology

## 2020-11-23 ENCOUNTER — Other Ambulatory Visit: Payer: Self-pay

## 2020-11-23 ENCOUNTER — Encounter: Payer: Self-pay | Admitting: Cardiology

## 2020-11-23 ENCOUNTER — Ambulatory Visit (INDEPENDENT_AMBULATORY_CARE_PROVIDER_SITE_OTHER): Payer: Medicare Other | Admitting: Cardiology

## 2020-11-23 VITALS — BP 126/62 | HR 71 | Ht 61.0 in | Wt 163.0 lb

## 2020-11-23 DIAGNOSIS — I480 Paroxysmal atrial fibrillation: Secondary | ICD-10-CM | POA: Diagnosis not present

## 2020-11-23 DIAGNOSIS — R5383 Other fatigue: Secondary | ICD-10-CM

## 2020-11-23 DIAGNOSIS — R0602 Shortness of breath: Secondary | ICD-10-CM

## 2020-11-23 LAB — TSH: TSH: 6.97 u[IU]/mL — ABNORMAL HIGH (ref 0.450–4.500)

## 2020-11-23 NOTE — Patient Instructions (Signed)
Medication Instructions:  Your physician recommends that you continue on your current medications as directed. Please refer to the Current Medication list given to you today.  *If you need a refill on your cardiac medications before your next appointment, please call your pharmacy*   Lab Work: TSH today If you have labs (blood work) drawn today and your tests are completely normal, you will receive your results only by: [name] MyChart Message (if you have MyChart) OR . A paper copy in the mail If you have any lab test that is abnormal or we need to change your treatment, we will call you to review the results.   Testing/Procedures: Your physician has requested that you have an echocardiogram. Echocardiography is a painless test that uses sound waves to create images of your heart. It provides your doctor with information about the size and shape of your heart and how well your heart's chambers and valves are working. This procedure takes approximately one hour. There are no restrictions for this procedure.   Follow-Up: At [facility], you and your health needs are our priority.  As part of our continuing mission to provide you with exceptional heart care, we have created designated Provider Care Teams.  These Care Teams include your primary Cardiologist (physician) and Advanced Practice Providers (APPs -  Physician Assistants and Nurse Practitioners) who all work together to provide you with the care you need, when you need it.  We recommend signing up for the patient portal called "MyChart".  Sign up information is provided on this After Visit Summary.  MyChart is used to connect with patients for Virtual Visits (Telemedicine).  Patients are able to view lab/test results, encounter notes, upcoming appointments, etc.  Non-urgent messages can be sent to your provider as well.   To learn more about what you can do with MyChart, go to [email or web address].    Your next appointment:   1  year(s)  The format for your next appointment:   In Person  Provider:   Allegra Lai, MD    Thank you for choosing [facility]!!   Trinidad Curet, RN [phone]

## 2020-11-23 NOTE — Progress Notes (Signed)
Electrophysiology Office Note   Date:  11/23/2020   ID:  [name] [name], [name] [name] [birth year], MRN [id number]  PCP:  Velna Hatchet, MD  Cardiologist:  Nahser Primary Electrophysiologist:  Keishon Chavarin Meredith Leeds, MD    No chief complaint on file.    History of Present Illness: [name] [name] is a 70 y.o. female who presents today for electrophysiology evaluation.     She has a history significant for paroxysmal atrial fibrillation, type of supra cardiomyopathy, hyperlipidemia.  Her ejection fraction has since normalized from a low of 15 to 20%.  She was admitted to the hospital and was noted to have atrial fibrillation.  She is status post ablation 08/03/2016.  Today, denies symptoms of palpitations, chest pain, orthopnea, PND, lower extremity edema, claudication, dizziness, presyncope, syncope, bleeding, or neurologic sequela. The patient is tolerating medications without difficulties.  Since last being seen she has had no further episodes of atrial fibrillation.  She is overall done well other than the fact that she feels more short of breath and fatigue.  She is short of breath when walking around the grocery store.  She also has some shortness of breath at rest.  She sleeps with the head of her bed elevated, but does feel that she would get short of breath that she wore to lie flat.  She is also had significant fatigue.  She states that she could fall asleep just about any time.   Past Medical History:  Diagnosis Date  . Anemia    hx of years ago   . Anxiety   . Aortic valve disorders   . Arthritis   . Asthma   . Barrett esophagus   . Bipolar disorder ([place])   . CHF (congestive heart failure) ([place])   . Chronic bronchitis ([place])   . Chronic kidney disease    "I don't have but 1" (08/03/2016)  . Complication of anesthesia   . COPD (chronic obstructive pulmonary disease) ([place])    "just have a touch" (08/03/2016)  . Depression   . Esophageal reflux   . Esophageal stricture   .  Family history of adverse reaction to anesthesia    brother had nausea and vomiting, mother has had nausea  . Fibromyalgia    "hands, knees, elbows; back; think it's in my hips" (08/03/2016)  . GAD (generalized anxiety disorder)   . GERD (gastroesophageal reflux disease)   . Hiatal hernia   . History of blood transfusion 1970s; 01/08/2013   "low blood count"  . History of kidney stones 01-08-13   past hx.  . History of nuclear stress test 01/2016   low risk study  . History of stomach ulcers    "some have bleed"  . Hyperlipemia   . Hypertension 12/07/2012  . Insomnia, unspecified   . Myocardial infarction [facility]) 01-08-13   '06-Chest pain-no stent-dx. MI-stress related  . Osteoporosis   . Personal history of colonic polyps 09/2010   TUBULAR ADENOMAS (X3); NEGATIVE FOR HIGH GRADE DYSPLASIA OR MALIGNANCY.  [name] Pneumonia    "several times" (08/03/2016)  . PONV (postoperative nausea and vomiting)   . Stroke [facility])    "2 or 3"; remains with some right sided weaknes (08/03/2016)  . Takotsubo syndrome   . Tremor, essential 03/20/2017  . Type II diabetes mellitus ([place])   . Urinary frequency    AND INCONTINENCE   Past Surgical History:  Procedure Laterality Date  . ANTERIOR CERVICAL DECOMP/DISCECTOMY FUSION N/A 01/31/2019   Procedure: ANTERIOR CERVICAL DECOMPRESSION/DISCECTOMY FUSION  C5-6;  Surgeon: Melina Schools, MD;  Location: [facility];  Service: Orthopedics;  Laterality: N/A;  3 hrs  . ATRIAL FIBRILLATION ABLATION  08/03/2016  . CARDIAC CATHETERIZATION     normal per Dr Acie Fredrickson in note dated 02/06/12   . CARDIAC CATHETERIZATION     "I've had 2 or 3" (08/03/2016)  . CARPAL TUNNEL RELEASE Right   . CATARACT EXTRACTION W/ INTRAOCULAR LENS  IMPLANT, BILATERAL    . COLONOSCOPY W/ BIOPSIES AND POLYPECTOMY    . DILATION AND CURETTAGE OF UTERUS     S/P miscarriage  . ELECTROPHYSIOLOGIC STUDY N/A 08/03/2016   Procedure: Atrial Fibrillation Ablation;  Surgeon: Egor Fullilove Meredith Leeds, MD;  Location: [place] [facility]  [facility];  Service: Cardiovascular;  Laterality: N/A;  . ESOPHAGEAL DILATION  01-08-13   2 yrs ago  . HEEL SPUR SURGERY Left 2005  . KNEE ARTHROSCOPY  02/28/2012   Procedure: ARTHROSCOPY KNEE;  Surgeon: Tobi Bastos, MD;  Location: [facility];  Service: Orthopedics;  Laterality: Left;  . LAPAROSCOPIC CHOLECYSTECTOMY  03/2002  . REVERSE SHOULDER ARTHROPLASTY Right 10/10/2019   Procedure: REVERSE SHOULDER ARTHROPLASTY;  Surgeon: Justice Britain, MD;  Location: [facility];  Service: Orthopedics;  Laterality: Right;  118min  . SHOULDER OPEN ROTATOR CUFF REPAIR Right 2011  . TONSILLECTOMY AND ADENOIDECTOMY  1965  . TOTAL KNEE ARTHROPLASTY Left 01/17/2013   Procedure: LEFT TOTAL KNEE ARTHROPLASTY;  Surgeon: Tobi Bastos, MD;  Location: [facility];  Service: Orthopedics;  Laterality: Left;  . TOTAL KNEE ARTHROPLASTY Right 06/19/2014   Procedure: RIGHT TOTAL KNEE ARTHROPLASTY;  Surgeon: Tobi Bastos, MD;  Location: [facility];  Service: Orthopedics;  Laterality: Right;  . TUBAL LIGATION    . VAGINAL HYSTERECTOMY     "partial"  . WRIST FLEXION TENDON TENOTOMIES AND PROXIMAL CORPECTOMY W/ WRIST ARTHRODESIS&ILIAC CREST BONE GRAFT       Current Outpatient Medications  Medication Sig Dispense Refill  . albuterol (PROVENTIL) (2.5 MG/3ML) 0.083% nebulizer solution Take 3 mLs (2.5 mg total) by nebulization every 6 (six) hours as needed for wheezing. DX  491.9, J44.9 360 mL 12  . ALPRAZolam (XANAX) 1 MG tablet Take 1 mg by mouth at bedtime.     [name] atorvastatin (LIPITOR) 80 MG tablet Take 80 mg by mouth at bedtime.     [name] azelastine (ASTELIN) 0.1 % nasal spray Place 1 spray into both nostrils at bedtime.     . beta carotene 25000 UNIT capsule Take 25,000 Units by mouth daily.    . cholecalciferol (VITAMIN D) 1000 UNITS tablet Take 1,000 Units by mouth daily.     . cyclobenzaprine (FLEXERIL) 10 MG tablet Take 1 tablet (10 mg total) by mouth 3 (three) times daily as needed for muscle spasms. 40 tablet 1  . dexlansoprazole  (DEXILANT) 60 MG capsule Take 60 mg by mouth daily.    [name] docusate sodium (COLACE) 100 MG capsule Take 100 mg by mouth 2 (two) times daily.    [name] ELIQUIS 5 MG TABS tablet TAKE 1 TABLET BY MOUTH TWICE DAILY 60 tablet 5  . EPINEPHrine (EPIPEN 2-PAK) 0.3 mg/0.3 mL IJ SOAJ injection USE AS DIRECTED (Patient taking differently: Inject 0.3 mg into the muscle as needed for anaphylaxis. USE AS DIRECTED) 2 each 12  . Flaxseed, Linseed, (FLAXSEED OIL) 1200 MG CAPS Take 1,200 mg by mouth at bedtime.    . furosemide (LASIX) 20 MG tablet TAKE 1 TABLET BY MOUTH EVERY DAY 90 tablet 0  . gabapentin (NEURONTIN) 300 MG  capsule Take 300-600 mg by mouth 3 (three) times daily as needed (pain).     [name] glipiZIDE (GLUCOTROL) 5 MG tablet Take 5 mg by mouth 2 (two) times daily before a meal.    . isosorbide mononitrate (IMDUR) 30 MG 24 hr tablet Take 1 tablet (30 mg total) by mouth daily. Please keep upcoming appointment with Dr Curt Bears for further refills 90 tablet 0  . montelukast (SINGULAIR) 10 MG tablet Take 1 tablet (10 mg total) by mouth at bedtime. (Patient taking differently: Take 10 mg by mouth daily.) 30 tablet 11  . ondansetron (ZOFRAN) 4 MG tablet Take 1 tablet (4 mg total) by mouth every 8 (eight) hours as needed for nausea or vomiting. 20 tablet 0  . ondansetron (ZOFRAN) 4 MG tablet Take 1 tablet (4 mg total) by mouth every 8 (eight) hours as needed for nausea or vomiting. 15 tablet 0  . oxyCODONE-acetaminophen (PERCOCET) 5-325 MG tablet Take 1 tablet by mouth every 4 (four) hours as needed for severe pain (max 6 q). 21 tablet 0  . PARoxetine (PAXIL) 30 MG tablet Take 30 mg by mouth at bedtime.   1  . PROAIR HFA 108 (90 Base) MCG/ACT inhaler INHALE TWO PUFFS INTO THE LUNGS EVERY 6 HOURS AS NEEDED FOR WHEEZING OR SHORTNESS OF BREATH (Patient taking differently: Inhale 2 puffs into the lungs every 6 (six) hours as needed for wheezing or shortness of breath.) 8.5 g 11  . traMADol (ULTRAM) 50 MG tablet Take 1 tablet (50  mg total) by mouth every 6 (six) hours as needed. (Patient taking differently: Take 50 mg by mouth every 6 (six) hours as needed for moderate pain.) 28 tablet 0  . vitamin B-12 (CYANOCOBALAMIN) 1000 MCG tablet Take 1,000 mcg by mouth every evening.      No current facility-administered medications for this visit.   Facility-Administered Medications Ordered in Other Visits  Medication Dose Route Frequency Provider Last Rate Last Admin  . tranexamic acid (CYKLOKAPRON) 2,000 mg in sodium chloride 0.9 % 50 mL Topical Application  5,784 mg Topical Once Constable, [employer], PA-C        Allergies:   Bee venom, Ceclor [cefaclor], Penicillins, Pneumococcal vaccines, Codeine, Cortisone, and Boniva [ibandronate sodium]   Social History:  The patient  reports that she quit smoking about 15 months ago. Her smoking use included cigarettes. She has a 25.00 pack-year smoking history. She quit smokeless tobacco use about 8 years ago.  Her smokeless tobacco use included snuff and chew. She reports current alcohol use. She reports that she does not use drugs.   Family History:  The patient's family history includes Breast cancer in her maternal aunt, maternal grandmother, and mother; Colon cancer (age of onset: 41) in her brother; Colon polyps in her brother; Diabetes in her maternal grandfather; Heart attack in her father; Heart disease in an other family member; Heart failure in her brother and father; Kidney disease in an other family member; Stroke in her mother; Transient ischemic attack in her mother.   ROS:  Please see the history of present illness.   Otherwise, review of systems is positive for none.   All other systems are reviewed and negative.   PHYSICAL EXAM: VS:  BP 126/62   Pulse 71   Ht 5\' 1"  (1.549 m)   Wt 163 lb (73.9 kg)   BMI 30.80 kg/m  , BMI Body mass index is 30.8 kg/m. GEN: Well nourished, well developed, in no acute distress  HEENT: normal  Neck: no JVD, carotid bruits, or  masses Cardiac: RRR; no murmurs, rubs, or gallops,no edema  Respiratory:  clear to auscultation bilaterally, normal work of breathing GI: soft, nontender, nondistended, + BS MS: no deformity or atrophy  Skin: warm and dry Neuro:  Strength and sensation are intact Psych: euthymic mood, full affect  EKG:  EKG is ordered today. Personal review of the ekg ordered shows sinus rhythm, rate 71   Recent Labs: No results found for requested labs within last 8760 hours.    Lipid Panel     Component Value Date/Time   CHOL 127 02/25/2016 0313   CHOL 129 12/07/2012 1445   TRIG 173 (H) 02/25/2016 0313   TRIG 147 12/07/2012 1445   HDL 25 (L) 02/25/2016 0313   HDL 40 12/07/2012 1445   CHOLHDL 5.1 02/25/2016 0313   VLDL 35 02/25/2016 0313   LDLCALC 67 02/25/2016 0313   LDLCALC 60 12/07/2012 1445     Wt Readings from Last 3 Encounters:  11/23/20 163 lb (73.9 kg)  06/20/20 153 lb (69.4 kg)  10/03/19 164 lb 7 oz (74.6 kg)      Other studies Reviewed: Additional studies/ records that were reviewed today include: TTE 03/02/17 Review of the above records today demonstrates:  - Left ventricle: Distal septal hypokinesis. The cavity size was   normal. Systolic function was normal. The estimated ejection   fraction was 55%. Wall motion was normal; there were no regional   wall motion abnormalities. Left ventricular diastolic function   parameters were normal. - Atrial septum: No defect or patent foramen ovale was identified. - Pulmonary arteries: PA peak pressure: 34 mm Hg (S).  05/25/18 SPECT  Nuclear stress EF: 67%.  Normal perfusion  This is a low risk study.  Cardiac monitor 06/11/2018 personally reviewed Sinus rhythm with sinus tachycardia.   Patient triggers were associated with sinus rhythm. Short runs of SVT, maximum 20 beats without symptoms  ASSESSMENT AND PLAN:  1.  Paroxysmal atrial fibrillation: Currently on Eliquis.  Status post ablation 08/03/2016.  CHA2DS2-VASc 3.   Remains in sinus rhythm.  2.  Hypertension: Currently well controlled  3.  Takotsubo cardiomyopathy: Currently Lasix and ARB.  Well compensated.  4.  Coronary artery disease: No current chest pain.  5.  Shortness of breath/fatigue: Patient been feeling this way over the last few months.  She is short of breath both with exertion and at rest.  I am concerned that she may have some element of heart failure.  We Carolyne Whitsel get a TSH and repeat her echo.  Current medicines are reviewed at length with the patient today.   The patient does not have concerns regarding her medicines.  The following changes were made today: None  Labs/ tests ordered today include:  Orders Placed This Encounter  Procedures  . TSH  . EKG 12-Lead  . ECHOCARDIOGRAM COMPLETE     Disposition:   FU with Zahriyah Joo 12 months  Signed, Maeson Purohit Meredith Leeds, MD  11/23/2020 11:30 AM     [facility] [address] [place] [address] [phone] (office) [phone] (fax)

## 2020-12-07 ENCOUNTER — Ambulatory Visit (HOSPITAL_COMMUNITY): Payer: Medicare Other | Attending: Cardiology

## 2020-12-07 ENCOUNTER — Other Ambulatory Visit: Payer: Self-pay

## 2020-12-07 DIAGNOSIS — R5383 Other fatigue: Secondary | ICD-10-CM | POA: Diagnosis not present

## 2020-12-07 DIAGNOSIS — R0602 Shortness of breath: Secondary | ICD-10-CM | POA: Diagnosis not present

## 2020-12-07 DIAGNOSIS — I480 Paroxysmal atrial fibrillation: Secondary | ICD-10-CM | POA: Diagnosis not present

## 2020-12-07 LAB — ECHOCARDIOGRAM COMPLETE
Area-P 1/2: 2.06 cm2
S' Lateral: 2.5 cm

## 2020-12-28 ENCOUNTER — Other Ambulatory Visit: Payer: Self-pay | Admitting: Cardiology

## 2020-12-29 ENCOUNTER — Telehealth: Payer: Self-pay | Admitting: Cardiology

## 2020-12-29 NOTE — Telephone Encounter (Signed)
Patient requests her echo results be sent to her PCP Dr. Ardeth Perfect at [facility].

## 2020-12-30 NOTE — Telephone Encounter (Signed)
Left detailed message informing pt that echo result was faxed to PCP

## 2021-01-26 ENCOUNTER — Other Ambulatory Visit: Payer: Self-pay | Admitting: Cardiology

## 2021-01-26 NOTE — Telephone Encounter (Addendum)
Prescription refill request for Eliquis received.  Indication: afib  Last office visit: afib, 11/23/2020 Scr: 0.8, 02/05/2020 Age: 70 yo  Weight: 73.9 kg   Pt is on the correct dose of Eliquis per dosing critiera, prescription refill sent for Eliquis 5mg  BID.

## 2021-01-28 ENCOUNTER — Telehealth: Payer: Self-pay | Admitting: Cardiovascular Disease

## 2021-01-28 NOTE — Telephone Encounter (Signed)
Pt c/o of Chest Pain: STAT if CP now or developed within 24 hours  1. Are you having CP right now? No  2. Are you experiencing any other symptoms (ex. SOB, nausea, vomiting, sweating)? Cold Sweats and SOB  3. How long have you been experiencing CP? 3-4 weeks  4. Is your CP continuous or coming and going? Coming and going especially when pt is really nervous  5. Have you taken Nitroglycerin? No, Pt does not have any Nitro ?

## 2021-01-28 NOTE — Telephone Encounter (Signed)
Spoke with the patient who reports that she has been having some intermitted chest tightness over the past several weeks. She states that it comes on with exertion and when she gets really nervous. She states that she is not currently having any chest pain. She states that yesterday was her most recent episode when she was carrying groceries inside. She states that she has some chest tightness and pressure in her back. She reports some SOB and diaphoresis. She states that symptoms resolved after about 5 minutes. She is taking Imdur 30 mg daily. Her blood pressure has been good 121/63, 116/62, and 125/62. Patient is scheduled on DOD slot with Dr. Irish Lack 7/5. Patient advised to call EMS if symptoms return in the meantime. Patient verbalized understanding.

## 2021-02-01 NOTE — Progress Notes (Signed)
Cardiology Office Note   Date:  02/02/2021   ID:  [name] [name], [name] [name] 07-18-1951, MRN [id number]  PCP:  Velna Hatchet, MD    No chief complaint on file.  Chest pain  Wt Readings from Last 3 Encounters:  02/02/21 162 lb (73.5 kg)  11/23/20 163 lb (73.9 kg)  06/20/20 153 lb (69.4 kg)       History of Present Illness: [name] [name] is a 70 y.o. female  with PAF, seen by Dr. Curt Bears. H/o Takotsubo cardiomyopathy.    Prior records from Dr. Curt Bears show: "She has a history significant for paroxysmal atrial fibrillation, type of supra cardiomyopathy, hyperlipidemia.  Her ejection fraction has since normalized from a low of 15 to 20%.  She was admitted to the hospital and was noted to have atrial fibrillation.  She is status post ablation 08/03/2016."  2019 monitor showed: "Sinus rhythm with sinus tachycardia.   Patient triggers were associated with sinus rhythm. Short runs of SVT, maximum 20 beats without symptoms"  She had more SHOB noted in 4/22.  Echo in 5/22 showed: "Left ventricular ejection fraction by 3D volume is 67 %. The left  ventricle has normal function. The left ventricle has no regional wall  motion abnormalities. Left ventricular diastolic parameters were normal.   2. Right ventricular systolic function is normal. The right ventricular  size is normal. There is normal pulmonary artery systolic pressure.   3. The mitral valve is normal in structure. Trivial mitral valve  regurgitation. No evidence of mitral stenosis.   4. The aortic valve is normal in structure. Aortic valve regurgitation is  not visualized. No aortic stenosis is present.   5. The inferior vena cava is normal in size with greater than 50%  respiratory variability, suggesting right atrial pressure of 3 mmHg. "  2017 Coronary CT showed: "Calcium Score:  207 Agatston units.   Coronary Arteries: Right dominant with no anomalies   LM: Calcified plaque in the ostial left main with no more  than mild stenosis.   LAD: Calcified plaque in the ostial LAD with mild stenosis. Medium-sized high diagonal with calcified plaque in the ostium, mild stenosis. Calcified plaque throughout the proximal LAD, < 50% stenosis.   Circumflex: Calcified plaque in the proximal LCx with mild stenosis. Large PLOM without significant disease.   RCA: Calcified plaque in the proximal RCA without significant stenosis.   No thrombus noted in the left atrial appendage."  She has had three weeks of nearly constant chest pain.  Worse with getting upset and nervous.  Better when she calms down.  She has some DOE.    Denies any recent : Leg edema. Nitroglycerin use. Orthopnea. Palpitations. Paroxysmal nocturnal dyspnea. Shortness of breath. Syncope.    She fell in December and had a knot on her forehead.  She had a lot bruising on her left chest and ribs.  Negative head CT.     Past Medical History:  Diagnosis Date   Anemia    hx of years ago    Anxiety    Aortic valve disorders    Arthritis    Asthma    Barrett esophagus    Bipolar disorder (HCC)    CHF (congestive heart failure) (HCC)    Chronic bronchitis ([place])    Chronic kidney disease    "I don't have but 1" (02/01/2594)   Complication of anesthesia    COPD (chronic obstructive pulmonary disease) ([place])    "just have a touch" (08/03/2016)  Depression    Esophageal reflux    Esophageal stricture    Family history of adverse reaction to anesthesia    brother had nausea and vomiting, mother has had nausea   Fibromyalgia    "hands, knees, elbows; back; think it's in my hips" (08/03/2016)   GAD (generalized anxiety disorder)    GERD (gastroesophageal reflux disease)    Hiatal hernia    History of blood transfusion 1970s; 01/08/2013   "low blood count"   History of kidney stones 01-08-13   past hx.   History of nuclear stress test 01/2016   low risk study   History of stomach ulcers    "some have bleed"   Hyperlipemia    Hypertension  12/07/2012   Insomnia, unspecified    Myocardial infarction [facility]) 01-08-13   '06-Chest pain-no stent-dx. MI-stress related   Osteoporosis    Personal history of colonic polyps 09/2010   TUBULAR ADENOMAS (X3); NEGATIVE FOR HIGH GRADE DYSPLASIA OR MALIGNANCY.   Pneumonia    "several times" (08/03/2016)   PONV (postoperative nausea and vomiting)    Stroke ([place])    "2 or 3"; remains with some right sided weaknes (08/03/2016)   Takotsubo syndrome    Tremor, essential 03/20/2017   Type II diabetes mellitus ([place])    Urinary frequency    AND INCONTINENCE    Past Surgical History:  Procedure Laterality Date   ANTERIOR CERVICAL DECOMP/DISCECTOMY FUSION N/A 01/31/2019   Procedure: ANTERIOR CERVICAL DECOMPRESSION/DISCECTOMY FUSION C5-6;  Surgeon: Melina Schools, MD;  Location: [facility];  Service: Orthopedics;  Laterality: N/A;  3 hrs   ATRIAL FIBRILLATION ABLATION  08/03/2016   CARDIAC CATHETERIZATION     normal per Dr Acie Fredrickson in note dated 02/06/12    CARDIAC CATHETERIZATION     "I've had 2 or 3" (08/03/2016)   CARPAL TUNNEL RELEASE Right    CATARACT EXTRACTION W/ INTRAOCULAR LENS  IMPLANT, BILATERAL     COLONOSCOPY W/ BIOPSIES AND POLYPECTOMY     DILATION AND CURETTAGE OF UTERUS     S/P miscarriage   ELECTROPHYSIOLOGIC STUDY N/A 08/03/2016   Procedure: Atrial Fibrillation Ablation;  Surgeon: Will Meredith Leeds, MD;  Location: [facility];  Service: Cardiovascular;  Laterality: N/A;   ESOPHAGEAL DILATION  01-08-13   2 yrs ago   HEEL SPUR SURGERY Left 2005   KNEE ARTHROSCOPY  02/28/2012   Procedure: ARTHROSCOPY KNEE;  Surgeon: Tobi Bastos, MD;  Location: [facility];  Service: Orthopedics;  Laterality: Left;   LAPAROSCOPIC CHOLECYSTECTOMY  03/2002   REVERSE SHOULDER ARTHROPLASTY Right 10/10/2019   Procedure: REVERSE SHOULDER ARTHROPLASTY;  Surgeon: Justice Britain, MD;  Location: [facility];  Service: Orthopedics;  Laterality: Right;  142min   SHOULDER OPEN ROTATOR CUFF REPAIR Right 2011   TONSILLECTOMY  AND ADENOIDECTOMY  1965   TOTAL KNEE ARTHROPLASTY Left 01/17/2013   Procedure: LEFT TOTAL KNEE ARTHROPLASTY;  Surgeon: Tobi Bastos, MD;  Location: [facility];  Service: Orthopedics;  Laterality: Left;   TOTAL KNEE ARTHROPLASTY Right 06/19/2014   Procedure: RIGHT TOTAL KNEE ARTHROPLASTY;  Surgeon: Tobi Bastos, MD;  Location: [facility];  Service: Orthopedics;  Laterality: Right;   TUBAL LIGATION     VAGINAL HYSTERECTOMY     "partial"   WRIST FLEXION TENDON TENOTOMIES AND PROXIMAL CORPECTOMY W/ WRIST ARTHRODESIS&ILIAC CREST BONE GRAFT       Current Outpatient Medications  Medication Sig Dispense Refill   albuterol (PROVENTIL) (2.5 MG/3ML) 0.083% nebulizer solution Take 3 mLs (2.5 mg total) by  nebulization every 6 (six) hours as needed for wheezing. DX  491.9, J44.9 360 mL 12   ALPRAZolam (XANAX) 1 MG tablet Take 1 mg by mouth at bedtime.      atorvastatin (LIPITOR) 80 MG tablet Take 80 mg by mouth at bedtime.      azelastine (ASTELIN) 0.1 % nasal spray Place 1 spray into both nostrils at bedtime.      beta carotene 25000 UNIT capsule Take 25,000 Units by mouth daily.     cholecalciferol (VITAMIN D) 1000 UNITS tablet Take 1,000 Units by mouth daily.      cyclobenzaprine (FLEXERIL) 10 MG tablet Take 1 tablet (10 mg total) by mouth 3 (three) times daily as needed for muscle spasms. 40 tablet 1   dexlansoprazole (DEXILANT) 60 MG capsule Take 60 mg by mouth daily.     docusate sodium (COLACE) 100 MG capsule Take 100 mg by mouth 2 (two) times daily.     ELIQUIS 5 MG TABS tablet TAKE 1 TABLET BY MOUTH TWICE DAILY 60 tablet 5   EPINEPHrine (EPIPEN 2-PAK) 0.3 mg/0.3 mL IJ SOAJ injection USE AS DIRECTED 2 each 12   Flaxseed, Linseed, (FLAXSEED OIL) 1200 MG CAPS Take 1,200 mg by mouth at bedtime.     furosemide (LASIX) 20 MG tablet TAKE 1 TABLET BY MOUTH EVERY DAY 90 tablet 3   gabapentin (NEURONTIN) 300 MG capsule Take 300-600 mg by mouth 3 (three) times daily as needed (pain).      glipiZIDE  (GLUCOTROL) 5 MG tablet Take 5 mg by mouth 2 (two) times daily before a meal.     isosorbide mononitrate (IMDUR) 30 MG 24 hr tablet Take 1 tablet (30 mg total) by mouth daily. Please keep upcoming appointment with Dr Curt Bears for further refills 90 tablet 0   montelukast (SINGULAIR) 10 MG tablet Take 1 tablet (10 mg total) by mouth at bedtime. 30 tablet 11   mupirocin ointment (BACTROBAN) 2 % Apply 2 application topically 2 (two) times daily as needed.     PARoxetine (PAXIL) 30 MG tablet Take 30 mg by mouth at bedtime.   1   primidone (MYSOLINE) 50 MG tablet Take 50 mg by mouth at bedtime. Take 50 mg by mouth at bedtime.     PROAIR HFA 108 (90 Base) MCG/ACT inhaler INHALE TWO PUFFS INTO THE LUNGS EVERY 6 HOURS AS NEEDED FOR WHEEZING OR SHORTNESS OF BREATH 8.5 g 11   traMADol (ULTRAM) 50 MG tablet Take 1 tablet (50 mg total) by mouth every 6 (six) hours as needed. 28 tablet 0   vitamin B-12 (CYANOCOBALAMIN) 1000 MCG tablet Take 1,000 mcg by mouth every evening.      No current facility-administered medications for this visit.   Facility-Administered Medications Ordered in Other Visits  Medication Dose Route Frequency Provider Last Rate Last Admin   tranexamic acid (CYKLOKAPRON) 2,000 mg in sodium chloride 0.9 % 50 mL Topical Application  4,128 mg Topical Once [place], [employer], PA-C        Allergies:   Bee venom, Ceclor [cefaclor], Penicillins, Pneumococcal vaccines, Codeine, Cortisone, and Boniva [ibandronate sodium]    Social History:  The patient  reports that she quit smoking about 18 months ago. Her smoking use included cigarettes. She has a 25.00 pack-year smoking history. She quit smokeless tobacco use about 9 years ago.  Her smokeless tobacco use included snuff and chew. She reports current alcohol use. She reports that she does not use drugs.   Family History:  The patient's family  history includes Breast cancer in her maternal aunt, maternal grandmother, and mother; Colon cancer (age  of onset: 5) in her brother; Colon polyps in her brother; Diabetes in her maternal grandfather; Heart attack in her father; Heart disease in an other family member; Heart failure in her brother and father; Kidney disease in an other family member; Stroke in her mother; Transient ischemic attack in her mother.    ROS:  Please see the history of present illness.   Otherwise, review of systems are positive for anxiety.   All other systems are reviewed and negative.    PHYSICAL EXAM: VS:  BP 128/68   Pulse 65   Ht 5' (1.524 m)   Wt 162 lb (73.5 kg)   SpO2 97%   BMI 31.64 kg/m  , BMI Body mass index is 31.64 kg/m. GEN: Well nourished, well developed, in no acute distress HEENT: normal Neck: no JVD, carotid bruits, or masses Cardiac: RRR; no murmurs, rubs, or gallops,no edema  Respiratory:  clear to auscultation bilaterally, normal work of breathing GI: soft, nontender, nondistended, + BS MS: no deformity or atrophy Skin: warm and dry, no rash Neuro:  Strength and sensation are intact Psych: euthymic mood, full affect   EKG:   The ekg ordered today demonstrates NSR, no ST changes   Recent Labs: 11/23/2020: TSH 6.970   Lipid Panel    Component Value Date/Time   CHOL 127 02/25/2016 0313   CHOL 129 12/07/2012 1445   TRIG 173 (H) 02/25/2016 0313   TRIG 147 12/07/2012 1445   HDL 25 (L) 02/25/2016 0313   HDL 40 12/07/2012 1445   CHOLHDL 5.1 02/25/2016 0313   VLDL 35 02/25/2016 0313   LDLCALC 67 02/25/2016 0313   LDLCALC 60 12/07/2012 1445     Other studies Reviewed: Additional studies/ records that were reviewed today with results demonstrating: prior CTA reviewed.   ASSESSMENT AND PLAN:  Precordial chest pain: Mild CAD noted by prior CTA in 2017.  Sx worse with "aggravating telephone calls" and "going places." No sx when she is at home and calm.  Given RF for CAD, will plan for CTA coronaries to eval or severe CAD.  PAF: In NSR.  Eliquis for stroke prevention.  DM: A1C  6.9.  COntinue medical therapy. High fiber diet. Avoid processed foods.    Current medicines are reviewed at length with the patient today.  The patient concerns regarding her medicines were addressed.  The following changes have been made:  No change  Labs/ tests ordered today include:  No orders of the defined types were placed in this encounter.   Recommend 150 minutes/week of aerobic exercise Low fat, low carb, high fiber diet recommended  Disposition:   FU for CTA   Signed, Larae Grooms, MD  02/02/2021 1:22 PM    [facility] [place], [place], [place]  [address] Phone: [phone]; Fax: [phone]

## 2021-02-02 ENCOUNTER — Encounter (INDEPENDENT_AMBULATORY_CARE_PROVIDER_SITE_OTHER): Payer: Self-pay

## 2021-02-02 ENCOUNTER — Ambulatory Visit (INDEPENDENT_AMBULATORY_CARE_PROVIDER_SITE_OTHER): Payer: Medicare Other | Admitting: Interventional Cardiology

## 2021-02-02 ENCOUNTER — Encounter: Payer: Self-pay | Admitting: Interventional Cardiology

## 2021-02-02 ENCOUNTER — Ambulatory Visit: Payer: Medicare Other | Admitting: Interventional Cardiology

## 2021-02-02 ENCOUNTER — Other Ambulatory Visit: Payer: Self-pay

## 2021-02-02 VITALS — BP 128/68 | HR 65 | Ht 60.0 in | Wt 162.0 lb

## 2021-02-02 DIAGNOSIS — I480 Paroxysmal atrial fibrillation: Secondary | ICD-10-CM

## 2021-02-02 DIAGNOSIS — R072 Precordial pain: Secondary | ICD-10-CM | POA: Diagnosis not present

## 2021-02-02 DIAGNOSIS — E1159 Type 2 diabetes mellitus with other circulatory complications: Secondary | ICD-10-CM

## 2021-02-02 MED ORDER — METOPROLOL TARTRATE 50 MG PO TABS: ORAL_TABLET | ORAL | 0 refills | Status: DC

## 2021-02-02 NOTE — Progress Notes (Deleted)
Cardiology Office Note     Date:  02/01/2021    ID:  [name] [name], [name] [name] 1950-10-29, MRN [id number]   PCP:  Velna Hatchet, MD         No chief complaint on file.   Chest pain      Wt Readings from Last 3 Encounters:  11/23/20 163 lb (73.9 kg)  06/20/20 153 lb (69.4 kg)  10/03/19 164 lb 7 oz (74.6 kg)        History of Present Illness: [name] [name] is a 70 y.o. female  with PAF, seen by Dr. Curt Bears. H/o Takotsubo cardiomyopathy.     Prior records from Dr. Curt Bears show: "She has a history significant for paroxysmal atrial fibrillation, type of supra cardiomyopathy, hyperlipidemia.  Her ejection fraction has since normalized from a low of 15 to 20%.  She was admitted to the hospital and was noted to have atrial fibrillation.  She is status post ablation 08/03/2016."   2019 monitor showed: "Sinus rhythm with sinus tachycardia.   Patient triggers were associated with sinus rhythm. Short runs of SVT, maximum 20 beats without symptoms"   She had more SHOB noted in 4/22.  Echo in 5/22 showed: "Left ventricular ejection fraction by 3D volume is 67 %. The left  ventricle has normal function. The left ventricle has no regional wall  motion abnormalities. Left ventricular diastolic parameters were normal.   2. Right ventricular systolic function is normal. The right ventricular  size is normal. There is normal pulmonary artery systolic pressure.   3. The mitral valve is normal in structure. Trivial mitral valve  regurgitation. No evidence of mitral stenosis.   4. The aortic valve is normal in structure. Aortic valve regurgitation is  not visualized. No aortic stenosis is present.   5. The inferior vena cava is normal in size with greater than 50%  respiratory variability, suggesting right atrial pressure of 3 mmHg. "   Coronary CT showed: "Calcium Score:  207 Agatston units.   Coronary Arteries: Right dominant with no anomalies   LM: Calcified plaque in the ostial left  main with no more than mild stenosis.   LAD: Calcified plaque in the ostial LAD with mild stenosis. Medium-sized high diagonal with calcified plaque in the ostium, mild stenosis. Calcified plaque throughout the proximal LAD, < 50% stenosis.   Circumflex: Calcified plaque in the proximal LCx with mild stenosis. Large PLOM without significant disease.   RCA: Calcified plaque in the proximal RCA without significant stenosis.   No thrombus noted in the left atrial appendage."       Past Medical History:  Diagnosis Date   Anemia      hx of years ago   Anxiety     Aortic valve disorders     Arthritis     Asthma     Barrett esophagus     Bipolar disorder (HCC)     CHF (congestive heart failure) (HCC)     Chronic bronchitis (HCC)     Chronic kidney disease      "I don't have but 1" (09/30/5174)   Complication of anesthesia     COPD (chronic obstructive pulmonary disease) ([place])      "just have a touch" (08/03/2016)   Depression     Esophageal reflux     Esophageal stricture     Family history of adverse reaction to anesthesia      brother had nausea and vomiting, mother has had nausea   Fibromyalgia      "  hands, knees, elbows; back; think it's in my hips" (08/03/2016)   GAD (generalized anxiety disorder)     GERD (gastroesophageal reflux disease)     Hiatal hernia     History of blood transfusion 1970s; 01/08/2013    "low blood count"   History of kidney stones 01-08-13    past hx.   History of nuclear stress test 01/2016    low risk study   History of stomach ulcers      "some have bleed"   Hyperlipemia     Hypertension 12/07/2012   Insomnia, unspecified     Myocardial infarction [facility]) 01-08-13    '06-Chest pain-no stent-dx. MI-stress related   Osteoporosis     Personal history of colonic polyps 09/2010    TUBULAR ADENOMAS (X3); NEGATIVE FOR HIGH GRADE DYSPLASIA OR MALIGNANCY.   Pneumonia      "several times" (08/03/2016)   PONV (postoperative nausea and vomiting)     Stroke  ([place])      "2 or 3"; remains with some right sided weaknes (08/03/2016)   Takotsubo syndrome     Tremor, essential 03/20/2017   Type II diabetes mellitus ([place])     Urinary frequency      AND INCONTINENCE           Past Surgical History:  Procedure Laterality Date   ANTERIOR CERVICAL DECOMP/DISCECTOMY FUSION N/A 01/31/2019    Procedure: ANTERIOR CERVICAL DECOMPRESSION/DISCECTOMY FUSION C5-6;  Surgeon: Melina Schools, MD;  Location: [facility] [place];  Service: Orthopedics;  Laterality: N/A;  3 hrs   ATRIAL FIBRILLATION ABLATION   08/03/2016   CARDIAC CATHETERIZATION        normal per Dr Acie Fredrickson in note dated 02/06/12   CARDIAC CATHETERIZATION        "I've had 2 or 3" (08/03/2016)   CARPAL TUNNEL RELEASE Right     CATARACT EXTRACTION W/ INTRAOCULAR LENS  IMPLANT, BILATERAL       COLONOSCOPY W/ BIOPSIES AND POLYPECTOMY       DILATION AND CURETTAGE OF UTERUS        S/P miscarriage   ELECTROPHYSIOLOGIC STUDY N/A 08/03/2016    Procedure: Atrial Fibrillation Ablation;  Surgeon: Will Meredith Leeds, MD;  Location: [place] [facility] [place] [facility];  Service: Cardiovascular;  Laterality: N/A;   ESOPHAGEAL DILATION   01-08-13    2 yrs ago   HEEL SPUR SURGERY Left 2005   KNEE ARTHROSCOPY   02/28/2012    Procedure: ARTHROSCOPY KNEE;  Surgeon: Tobi Bastos, MD;  Location: [facility];  Service: Orthopedics;  Laterality: Left;   LAPAROSCOPIC CHOLECYSTECTOMY   03/2002   REVERSE SHOULDER ARTHROPLASTY Right 10/10/2019    Procedure: REVERSE SHOULDER ARTHROPLASTY;  Surgeon: Justice Britain, MD;  Location: [facility];  Service: Orthopedics;  Laterality: Right;  16min   SHOULDER OPEN ROTATOR CUFF REPAIR Right 2011   TONSILLECTOMY AND ADENOIDECTOMY   1965   TOTAL KNEE ARTHROPLASTY Left 01/17/2013    Procedure: LEFT TOTAL KNEE ARTHROPLASTY;  Surgeon: Tobi Bastos, MD;  Location: [facility];  Service: Orthopedics;  Laterality: Left;   TOTAL KNEE ARTHROPLASTY Right 06/19/2014    Procedure: RIGHT TOTAL KNEE ARTHROPLASTY;  Surgeon: Tobi Bastos,  MD;  Location: [facility];  Service: Orthopedics;  Laterality: Right;   TUBAL LIGATION       VAGINAL HYSTERECTOMY        "partial"   WRIST FLEXION TENDON TENOTOMIES AND PROXIMAL CORPECTOMY W/ WRIST ARTHRODESIS&ILIAC CREST BONE GRAFT  Current Outpatient Medications  Medication Sig Dispense Refill   albuterol (PROVENTIL) (2.5 MG/3ML) 0.083% nebulizer solution Take 3 mLs (2.5 mg total) by nebulization every 6 (six) hours as needed for wheezing. DX  491.9, J44.9 360 mL 12   ALPRAZolam (XANAX) 1 MG tablet Take 1 mg by mouth at bedtime.       atorvastatin (LIPITOR) 80 MG tablet Take 80 mg by mouth at bedtime.       azelastine (ASTELIN) 0.1 % nasal spray Place 1 spray into both nostrils at bedtime.       beta carotene 25000 UNIT capsule Take 25,000 Units by mouth daily.       cholecalciferol (VITAMIN D) 1000 UNITS tablet Take 1,000 Units by mouth daily.       cyclobenzaprine (FLEXERIL) 10 MG tablet Take 1 tablet (10 mg total) by mouth 3 (three) times daily as needed for muscle spasms. 40 tablet 1   dexlansoprazole (DEXILANT) 60 MG capsule Take 60 mg by mouth daily.       docusate sodium (COLACE) 100 MG capsule Take 100 mg by mouth 2 (two) times daily.       ELIQUIS 5 MG TABS tablet TAKE 1 TABLET BY MOUTH TWICE DAILY 60 tablet 5   EPINEPHrine (EPIPEN 2-PAK) 0.3 mg/0.3 mL IJ SOAJ injection USE AS DIRECTED (Patient taking differently: Inject 0.3 mg into the muscle as needed for anaphylaxis. USE AS DIRECTED) 2 each 12   Flaxseed, Linseed, (FLAXSEED OIL) 1200 MG CAPS Take 1,200 mg by mouth at bedtime.       furosemide (LASIX) 20 MG tablet TAKE 1 TABLET BY MOUTH EVERY DAY 90 tablet 3   gabapentin (NEURONTIN) 300 MG capsule Take 300-600 mg by mouth 3 (three) times daily as needed (pain).       glipiZIDE (GLUCOTROL) 5 MG tablet Take 5 mg by mouth 2 (two) times daily before a meal.       isosorbide mononitrate (IMDUR) 30 MG 24 hr tablet Take 1 tablet (30 mg total) by mouth daily. Please keep  upcoming appointment with Dr Curt Bears for further refills 90 tablet 0   montelukast (SINGULAIR) 10 MG tablet Take 1 tablet (10 mg total) by mouth at bedtime. (Patient taking differently: Take 10 mg by mouth daily.) 30 tablet 11   ondansetron (ZOFRAN) 4 MG tablet Take 1 tablet (4 mg total) by mouth every 8 (eight) hours as needed for nausea or vomiting. 20 tablet 0   ondansetron (ZOFRAN) 4 MG tablet Take 1 tablet (4 mg total) by mouth every 8 (eight) hours as needed for nausea or vomiting. 15 tablet 0   oxyCODONE-acetaminophen (PERCOCET) 5-325 MG tablet Take 1 tablet by mouth every 4 (four) hours as needed for severe pain (max 6 q). 21 tablet 0   PARoxetine (PAXIL) 30 MG tablet Take 30 mg by mouth at bedtime.   1   PROAIR HFA 108 (90 Base) MCG/ACT inhaler INHALE TWO PUFFS INTO THE LUNGS EVERY 6 HOURS AS NEEDED FOR WHEEZING OR SHORTNESS OF BREATH (Patient taking differently: Inhale 2 puffs into the lungs every 6 (six) hours as needed for wheezing or shortness of breath.) 8.5 g 11   traMADol (ULTRAM) 50 MG tablet Take 1 tablet (50 mg total) by mouth every 6 (six) hours as needed. (Patient taking differently: Take 50 mg by mouth every 6 (six) hours as needed for moderate pain.) 28 tablet 0   vitamin B-12 (CYANOCOBALAMIN) 1000 MCG tablet Take 1,000 mcg by mouth every evening.  No current facility-administered medications for this visit.             Facility-Administered Medications Ordered in Other Visits  Medication Dose Route Frequency Provider Last Rate Last Admin   tranexamic acid (CYKLOKAPRON) 2,000 mg in sodium chloride 0.9 % 50 mL Topical Application  0,160 mg Topical Once [place], [employer], PA-C          Allergies:   Bee venom, Ceclor [cefaclor], Penicillins, Pneumococcal vaccines, Codeine, Cortisone, and Boniva [ibandronate sodium]      Social History:  The patient  reports that she quit smoking about 18 months ago. Her smoking use included cigarettes. She has a 25.00 pack-year smoking  history. She quit smokeless tobacco use about 9 years ago.  Her smokeless tobacco use included snuff and chew. She reports current alcohol use. She reports that she does not use drugs.    Family History:  The patient's ***family history includes Breast cancer in her maternal aunt, maternal grandmother, and mother; Colon cancer (age of onset: 33) in her brother; Colon polyps in her brother; Diabetes in her maternal grandfather; Heart attack in her father; Heart disease in an other family member; Heart failure in her brother and father; Kidney disease in an other family member; Stroke in her mother; Transient ischemic attack in her mother.      ROS:  Please see the history of present illness.   Otherwise, review of systems are positive for ***.   All other systems are reviewed and negative.     PHYSICAL EXAM: VS:  There were no vitals taken for this visit. , BMI There is no height or weight on file to calculate BMI. GEN: Well nourished, well developed, in no acute distress HEENT: normal Neck: no JVD, carotid bruits, or masses Cardiac: ***RRR; no murmurs, rubs, or gallops,no edema Respiratory:  clear to auscultation bilaterally, normal work of breathing GI: soft, nontender, nondistended, + BS MS: no deformity or atrophy Skin: warm and dry, no rash Neuro:  Strength and sensation are intact Psych: euthymic mood, full affect     EKG:   The ekg ordered today demonstrates ***     Recent Labs: 11/23/2020: TSH 6.970    Lipid Panel Labs (Brief)          Component Value Date/Time    CHOL 127 02/25/2016 0313    CHOL 129 12/07/2012 1445    TRIG 173 (H) 02/25/2016 0313    TRIG 147 12/07/2012 1445    HDL 25 (L) 02/25/2016 0313    HDL 40 12/07/2012 1445    CHOLHDL 5.1 02/25/2016 0313    VLDL 35 02/25/2016 0313    LDLCALC 67 02/25/2016 0313    LDLCALC 60 12/07/2012 1445        Other studies Reviewed: Additional studies/ records that were reviewed today with results demonstrating: ***.      ASSESSMENT AND PLAN:   Precordial chest pain: Mild CAD noted by prior CTA in 2017.   PAF:  ***     Current medicines are reviewed at length with the patient today.  The patient concerns regarding her medicines were addressed.   The following changes have been made:  No change***   Labs/ tests ordered today include: *** No orders of the defined types were placed in this encounter.     Recommend 150 minutes/week of aerobic exercise Low fat, low carb, high fiber diet recommended   Disposition:   FU in ***     Signed, Larae Grooms, MD  02/01/2021  9:27 PM    [facility] [place], [place], [place]  [address] Phone: [phone]; Fax: [phone]

## 2021-02-02 NOTE — Patient Instructions (Signed)
Medication Instructions:  Your physician recommends that you continue on your current medications as directed. Please refer to the Current Medication list given to you today.  *If you need a refill on your cardiac medications before your next appointment, please call your pharmacy*   Lab Work: Lab work to be done today--BMP If you have labs (blood work) drawn today and your tests are completely normal, you will receive your results only by: [place] (if you have MyChart) OR A paper copy in the mail If you have any lab test that is abnormal or we need to change your treatment, we will call you to review the results.   Testing/Procedures: Your physician has requested that you have cardiac CT. Cardiac computed tomography (CT) is a painless test that uses an x-ray machine to take clear, detailed pictures of your heart. For further information please visit [email or web address]. Please follow instruction sheet as given.     Follow-Up: At [facility], you and your health needs are our priority.  As part of our continuing mission to provide you with exceptional heart care, we have created designated Provider Care Teams.  These Care Teams include your primary Cardiologist (physician) and Advanced Practice Providers (APPs -  Physician Assistants and Nurse Practitioners) who all work together to provide you with the care you need, when you need it.  We recommend signing up for the patient portal called "MyChart".  Sign up information is provided on this After Visit Summary.  MyChart is used to connect with patients for Virtual Visits (Telemedicine).  Patients are able to view lab/test results, encounter notes, upcoming appointments, etc.  Non-urgent messages can be sent to your provider as well.   To learn more about what you can do with MyChart, go to [email or web address].    Your next appointment:   Based on results  The format for your next appointment:   In Person  Provider:    You may see Dr Irish Lack or one of the following Advanced Practice Providers on your designated Care Team:   Melina Copa, PA-C Ermalinda Barrios, PA-C   Other Instructions   Your cardiac CT will be scheduled at one of the below locations:   [facility] [address] [place], [place] [address] [phone]  [facility] [address], [place] [address] [phone]  If scheduled at [facility], please arrive at the [facility] main entrance (entrance A) of [facility] 30 minutes prior to test start time. Proceed to the [facility] Radiology Department (first floor) to check-in and test prep.  If scheduled at [facility], please arrive 15 mins early for check-in and test prep.  Please follow these instructions carefully (unless otherwise directed):     On the Night Before the Test: Be sure to Drink plenty of water. Do not consume any caffeinated/decaffeinated beverages or chocolate 12 hours prior to your test. Do not take any antihistamines 12 hours prior to your test.   On the Day of the Test: Drink plenty of water until 1 hour prior to the test. Do not eat any food 4 hours prior to the test. You may take your regular medications prior to the test.  Take metoprolol (Lopressor) two hours prior to test. Dose is 50 mg HOLD Furosemide/Hydrochlorothiazide morning of the test. FEMALES- please wear underwire-free bra if available        After the Test: Drink plenty of water. After receiving  IV contrast, you may experience a mild flushed feeling. This is normal. On occasion, you may experience a mild rash up to 24 hours after the test. This is not dangerous. If this occurs, you can take Benadryl 25 mg and increase your fluid intake. If you experience trouble breathing, this can be serious. If it is severe call 911 IMMEDIATELY. If it is mild, please call our  office. If you take any of these medications: Glipizide/Metformin, Avandament, Glucavance, please do not take 48 hours after completing test unless otherwise instructed.   Once we have confirmed authorization from your insurance company, we will call you to set up a date and time for your test. Based on how quickly your insurance processes prior authorizations requests, please allow up to 4 weeks to be contacted for scheduling your Cardiac CT appointment. Be advised that routine Cardiac CT appointments could be scheduled as many as 8 weeks after your provider has ordered it.  For non-scheduling related questions, please contact the cardiac imaging nurse navigator should you have any questions/concerns: Marchia Bond, Cardiac Imaging Nurse Navigator Gordy Clement, Cardiac Imaging Nurse Navigator [facility] Direct Office Dial: [phone]   For scheduling needs, including cancellations and rescheduling, please call [place], [phone].

## 2021-02-03 LAB — BASIC METABOLIC PANEL
BUN/Creatinine Ratio: 14 (ref 12–28)
BUN: 10 mg/dL (ref 8–27)
CO2: 23 mmol/L (ref 20–29)
Calcium: 9.2 mg/dL (ref 8.7–10.3)
Chloride: 103 mmol/L (ref 96–106)
Creatinine, Ser: 0.69 mg/dL (ref 0.57–1.00)
Glucose: 171 mg/dL — ABNORMAL HIGH (ref 65–99)
Potassium: 4.3 mmol/L (ref 3.5–5.2)
Sodium: 138 mmol/L (ref 134–144)
eGFR: 94 mL/min/{1.73_m2} (ref >59)

## 2021-02-04 ENCOUNTER — Telehealth (HOSPITAL_COMMUNITY): Payer: Self-pay | Admitting: Emergency Medicine

## 2021-02-04 NOTE — Telephone Encounter (Signed)
Attempted to call patient regarding upcoming cardiac CT appointment. °Left message on voicemail with name and callback number °Lynda Capistran RN Navigator Cardiac Imaging °[facility] °[phone] Office °[phone] Cell ° °

## 2021-02-04 NOTE — Telephone Encounter (Signed)
Reaching out to patient to offer assistance regarding upcoming cardiac imaging study; pt verbalizes understanding of appt date/time, parking situation and where to check in, pre-test NPO status and medications ordered, and verified current allergies; name and call back number provided for further questions should they arise Marchia Bond RN [place] [facility] [phone] office [phone] cell  CLAUSTRO- will take 0.5mg  xanax (home rx) prior to scan 50mg  metoprolol tartrate  Denies IV issues

## 2021-02-05 ENCOUNTER — Other Ambulatory Visit: Payer: Self-pay

## 2021-02-05 ENCOUNTER — Ambulatory Visit (HOSPITAL_COMMUNITY)
Admission: RE | Admit: 2021-02-05 | Discharge: 2021-02-05 | Disposition: A | Discharge status: Home / Self Care | Payer: Medicare Other | Source: Ambulatory Visit | Attending: Interventional Cardiology | Admitting: Interventional Cardiology

## 2021-02-05 DIAGNOSIS — R072 Precordial pain: Secondary | ICD-10-CM | POA: Diagnosis not present

## 2021-02-05 MED ORDER — IOHEXOL 350 MG/ML SOLN
100.0000 mL | Freq: Once | INTRAVENOUS | Status: AC | PRN
  Administered 2021-02-05: 100 mL via INTRAVENOUS

## 2021-02-05 MED ORDER — NITROGLYCERIN 0.4 MG SL SUBL
SUBLINGUAL_TABLET | SUBLINGUAL | Status: AC
  Filled 2021-02-05: qty 2

## 2021-02-05 MED ORDER — SODIUM CHLORIDE 0.9 % IV BOLUS
500.0000 mL | Freq: Once | INTRAVENOUS | Status: AC
  Administered 2021-02-05: 500 mL via INTRAVENOUS

## 2021-02-05 MED ORDER — NITROGLYCERIN 0.4 MG SL SUBL
0.8000 mg | SUBLINGUAL_TABLET | Freq: Once | SUBLINGUAL | Status: AC
  Administered 2021-02-05: 0.8 mg via SUBLINGUAL

## 2021-02-05 NOTE — Progress Notes (Signed)
Patient tolerated cardiac CT well however while patient sitting up became lightheaded. Assisted patient to wheelchair and back to radiology nurse station where patient transferred from wheelchair to recliner. Patient alert answering and following commands appropriate. Upon transfer patient became light headed and felt room spinning. BP 98/53 HR 50's Denies chest pain or shortness of breath. Patient alert drinking water and states drinks coffee 4 cups a day with none today. Patient drank coffee ate cookies and drank water. Patient BP increased 108/77. Patient ambulated to bathroom with two nurses. After using the bathroom and leaving the bathroom became lightheaded again with room spinning. BP 104/79 HR 50's sitting and  BP 118/62 HR 50's standing. Dr Debara Pickett notified history of CHF and to administer 0.9 NS 500ML and reassess after.

## 2021-02-05 NOTE — Progress Notes (Signed)
56ml NS bolus complete. Pt walked steadily to bathroom and back to room without any complain of dizziness. States she feels 100% better. D/C home in wheelchair with brother and mother. Brother states she will spend the night at his house

## 2021-03-16 ENCOUNTER — Other Ambulatory Visit: Payer: Self-pay | Admitting: Internal Medicine

## 2021-03-16 DIAGNOSIS — Z0000 Encounter for general adult medical examination without abnormal findings: Secondary | ICD-10-CM

## 2021-03-19 ENCOUNTER — Other Ambulatory Visit: Payer: Self-pay | Admitting: Cardiology

## 2021-06-10 LAB — COLOGUARD: COLOGUARD: POSITIVE — AB

## 2021-07-16 ENCOUNTER — Encounter: Payer: Self-pay | Admitting: Gastroenterology

## 2021-07-25 ENCOUNTER — Other Ambulatory Visit: Payer: Self-pay | Admitting: Cardiology

## 2021-07-25 DIAGNOSIS — I4891 Unspecified atrial fibrillation: Secondary | ICD-10-CM

## 2021-07-27 NOTE — Telephone Encounter (Signed)
Eliquis 5mg  refill request received. Patient is 70 years old, weight-73.5kg, Crea-0.69 on 02/02/2021, Diagnosis-PAF, and last seen by Dr. Irish Lack on 02/02/2021. Dose is appropriate based on dosing criteria. Will send in refill to requested pharmacy.

## 2021-08-17 ENCOUNTER — Encounter: Payer: Self-pay | Admitting: Gastroenterology

## 2021-08-17 ENCOUNTER — Ambulatory Visit (INDEPENDENT_AMBULATORY_CARE_PROVIDER_SITE_OTHER): Payer: Medicare Other | Admitting: Gastroenterology

## 2021-08-17 VITALS — BP 126/68 | HR 72 | Ht 60.0 in | Wt 159.0 lb

## 2021-08-17 DIAGNOSIS — Z7901 Long term (current) use of anticoagulants: Secondary | ICD-10-CM

## 2021-08-17 DIAGNOSIS — R195 Other fecal abnormalities: Secondary | ICD-10-CM

## 2021-08-17 MED ORDER — NA SULFATE-K SULFATE-MG SULF 17.5-3.13-1.6 GM/177ML PO SOLN: 1.0000 | Freq: Once | ORAL | 0 refills | Status: AC

## 2021-08-17 NOTE — Progress Notes (Signed)
HPI : [name] [name] is a very pleasant 71 year old female with a history of paroxysmal atrial fibrillation and CHF, COPD, history of stroke who is referred to [place] by Dr. Velna Hatchet for positive Cologuard.  She denies any chronic GI symptoms.  She is having regular bowel movements, usually once a day.  She does take stool softeners to help her pass stool and does sometimes have to strain with bowel movements.  She denies seeing any blood in the stool.  She denies any problems with abdominal pain or diarrhea.  She does have a history of chronic GERD symptoms which are currently well controlled with Dexilant once daily.  She will have breakthrough symptoms sometimes depending on what she eats.  She sometimes takes an Nexium at night when she is having breakthrough symptoms.  She denies any dysphagia.  She has chronic dyspnea and poor exercise tolerance.  Patient states she cannot go up a flight of stairs without stopping.  She states she has never had problems with flow oxygen, and has never been recommended to go on supplemental oxygen.  She denies any symptoms of chest pain or chest pressure.  No problems with lower extremity edema or orthopnea.  Her most recent echocardiogram in May of this year showed an ejection fraction of 67%, with normal biventricular function, no regional wall abnormalities and normal pulmonary artery pressure.  Her aortic valve was normal without any stenosis or regurgitation.  She underwent ablation for atrial fibrillation in January 2018.  A monitor study in 2019 showed sinus rhythm with sinus tachycardia, with short runs of SVT.  She had a coronary CT in 2017 which showed no obstructive coronary artery disease  She underwent a colonoscopy in 2016 by Dr. Carlean Purl in which 2 small tubular adenomas were removed.  She was recommended to repeat in 5 years at that time.  She also underwent an upper endoscopy at that time which was notable for a small hiatal hernia and a low-grade  esophageal stricture dilated with a 20 French Maloney dilator  She reports a history of postoperative nausea and vomiting, but denies any other complications of anesthesia.  Past Medical History:  Diagnosis Date   Anemia    hx of years ago    Anxiety    Aortic valve disorders    Arthritis    Asthma    Barrett esophagus    Bipolar disorder (HCC)    CHF (congestive heart failure) (HCC)    Chronic bronchitis (HCC)    Chronic kidney disease    "I don't have but 1" (10/04/4654)   Complication of anesthesia    COPD (chronic obstructive pulmonary disease) ([place])    "just have a touch" (08/03/2016)   Depression    Esophageal reflux    Esophageal stricture    Family history of adverse reaction to anesthesia    brother had nausea and vomiting, mother has had nausea   Fibromyalgia    "hands, knees, elbows; back; think it's in my hips" (08/03/2016)   GAD (generalized anxiety disorder)    GERD (gastroesophageal reflux disease)    Hiatal hernia    History of blood transfusion 1970s; 01/08/2013   "low blood count"   History of kidney stones 01-08-13   past hx.   History of nuclear stress test 01/2016   low risk study   History of stomach ulcers    "some have bleed"   Hyperlipemia    Hypertension 12/07/2012   Insomnia, unspecified    Myocardial infarction ([place])  01-08-13   '06-Chest pain-no stent-dx. MI-stress related   Osteoporosis    Personal history of colonic polyps 09/2010   TUBULAR ADENOMAS (X3); NEGATIVE FOR HIGH GRADE DYSPLASIA OR MALIGNANCY.   Pneumonia    "several times" (08/03/2016)   PONV (postoperative nausea and vomiting)    Stroke ([place])    "2 or 3"; remains with some right sided weaknes (08/03/2016)   Takotsubo syndrome    Tremor, essential 03/20/2017   Type II diabetes mellitus ([place])    Urinary frequency    AND INCONTINENCE     Past Surgical History:  Procedure Laterality Date   ANTERIOR CERVICAL DECOMP/DISCECTOMY FUSION N/A 01/31/2019   Procedure: ANTERIOR CERVICAL  DECOMPRESSION/DISCECTOMY FUSION C5-6;  Surgeon: Melina Schools, MD;  Location: [facility];  Service: Orthopedics;  Laterality: N/A;  3 hrs   ATRIAL FIBRILLATION ABLATION  08/03/2016   CARDIAC CATHETERIZATION     normal per Dr Acie Fredrickson in note dated 02/06/12    CARDIAC CATHETERIZATION     "I've had 2 or 3" (08/03/2016)   CARPAL TUNNEL RELEASE Right    CATARACT EXTRACTION W/ INTRAOCULAR LENS  IMPLANT, BILATERAL     COLONOSCOPY W/ BIOPSIES AND POLYPECTOMY     DILATION AND CURETTAGE OF UTERUS     S/P miscarriage   ELECTROPHYSIOLOGIC STUDY N/A 08/03/2016   Procedure: Atrial Fibrillation Ablation;  Surgeon: Will Meredith Leeds, MD;  Location: [facility];  Service: Cardiovascular;  Laterality: N/A;   ESOPHAGEAL DILATION  01-08-13   2 yrs ago   HEEL SPUR SURGERY Left 2005   KNEE ARTHROSCOPY  02/28/2012   Procedure: ARTHROSCOPY KNEE;  Surgeon: Tobi Bastos, MD;  Location: [facility];  Service: Orthopedics;  Laterality: Left;   LAPAROSCOPIC CHOLECYSTECTOMY  03/2002   REVERSE SHOULDER ARTHROPLASTY Right 10/10/2019   Procedure: REVERSE SHOULDER ARTHROPLASTY;  Surgeon: Justice Britain, MD;  Location: [facility];  Service: Orthopedics;  Laterality: Right;  115min   SHOULDER OPEN ROTATOR CUFF REPAIR Right 2011   TONSILLECTOMY AND ADENOIDECTOMY  1965   TOTAL KNEE ARTHROPLASTY Left 01/17/2013   Procedure: LEFT TOTAL KNEE ARTHROPLASTY;  Surgeon: Tobi Bastos, MD;  Location: [facility];  Service: Orthopedics;  Laterality: Left;   TOTAL KNEE ARTHROPLASTY Right 06/19/2014   Procedure: RIGHT TOTAL KNEE ARTHROPLASTY;  Surgeon: Tobi Bastos, MD;  Location: [facility];  Service: Orthopedics;  Laterality: Right;   TUBAL LIGATION     VAGINAL HYSTERECTOMY     "partial"   WRIST FLEXION TENDON TENOTOMIES AND PROXIMAL CORPECTOMY W/ WRIST ARTHRODESIS&ILIAC CREST BONE GRAFT     Family History  Problem Relation Age of Onset   Breast cancer Mother    Stroke Mother    Transient ischemic attack Mother    Colon cancer Brother 71    Colon polyps Brother    Heart failure Brother    Heart attack Father    Heart failure Father    Diabetes Maternal Grandfather    Kidney disease Other        Both sides of family   Heart disease Other        Both sides of family   Breast cancer Maternal Aunt    Breast cancer Maternal Grandmother    Social History   Tobacco Use   Smoking status: Former    Packs/day: 0.50    Years: 50.00    Pack years: 25.00    Types: Cigarettes    Quit date: 08/03/2019    Years since quitting: 2.0   Smokeless tobacco: Former  Types: Snuff, Chew    Quit date: 12/31/2011   Tobacco comments:    01/24/2019 pt reports smoking 6-7 cigs some days and 1 pack other days  Vaping Use   Vaping Use: Never used  Substance Use Topics   Alcohol use: Yes    Comment: 08/03/2016 "maybe 1 beer/month; a shot of rum" maybe twice/year"   Drug use: No   Current Outpatient Medications  Medication Sig Dispense Refill   ALPRAZolam (XANAX) 1 MG tablet Take 1 mg by mouth at bedtime.      atorvastatin (LIPITOR) 80 MG tablet Take 80 mg by mouth at bedtime.      azelastine (ASTELIN) 0.1 % nasal spray Place 1 spray into both nostrils at bedtime.      beta carotene 25000 UNIT capsule Take 25,000 Units by mouth daily.     cholecalciferol (VITAMIN D) 1000 UNITS tablet Take 1,000 Units by mouth daily.      cyclobenzaprine (FLEXERIL) 10 MG tablet Take 1 tablet (10 mg total) by mouth 3 (three) times daily as needed for muscle spasms. 40 tablet 1   dexlansoprazole (DEXILANT) 60 MG capsule Take 60 mg by mouth daily.     docusate sodium (COLACE) 100 MG capsule Take 100 mg by mouth 2 (two) times daily.     ELIQUIS 5 MG TABS tablet TAKE 1 TABLET BY MOUTH TWICE DAILY 60 tablet 5   EPINEPHrine (EPIPEN 2-PAK) 0.3 mg/0.3 mL IJ SOAJ injection USE AS DIRECTED 2 each 12   Flaxseed, Linseed, (FLAXSEED OIL) 1200 MG CAPS Take 1,200 mg by mouth at bedtime.     furosemide (LASIX) 20 MG tablet TAKE 1 TABLET BY MOUTH EVERY DAY 90 tablet 3    glipiZIDE (GLUCOTROL) 5 MG tablet Take 5 mg by mouth 2 (two) times daily before a meal.     isosorbide mononitrate (IMDUR) 30 MG 24 hr tablet Take 1 tablet (30 mg total) by mouth daily. 90 tablet 1   montelukast (SINGULAIR) 10 MG tablet Take 1 tablet (10 mg total) by mouth at bedtime. 30 tablet 11   mupirocin ointment (BACTROBAN) 2 % Apply 2 application topically 2 (two) times daily as needed.     PARoxetine (PAXIL) 30 MG tablet Take 30 mg by mouth at bedtime.   1   primidone (MYSOLINE) 50 MG tablet Take 50 mg by mouth at bedtime. Take 50 mg by mouth at bedtime.     PROAIR HFA 108 (90 Base) MCG/ACT inhaler INHALE TWO PUFFS INTO THE LUNGS EVERY 6 HOURS AS NEEDED FOR WHEEZING OR SHORTNESS OF BREATH 8.5 g 11   traMADol (ULTRAM) 50 MG tablet Take 1 tablet (50 mg total) by mouth every 6 (six) hours as needed. 28 tablet 0   vitamin B-12 (CYANOCOBALAMIN) 1000 MCG tablet Take 1,000 mcg by mouth every evening.      albuterol (PROVENTIL) (2.5 MG/3ML) 0.083% nebulizer solution Take 3 mLs (2.5 mg total) by nebulization every 6 (six) hours as needed for wheezing. DX  491.9, J44.9 (Patient not taking: Reported on 08/17/2021) 360 mL 12   No current facility-administered medications for this visit.   Facility-Administered Medications Ordered in Other Visits  Medication Dose Route Frequency Provider Last Rate Last Admin   tranexamic acid (CYKLOKAPRON) 2,000 mg in sodium chloride 0.9 % 50 mL Topical Application  8,786 mg Topical Once [place], [employer], PA-C       Allergies  Allergen Reactions   Bee Venom Anaphylaxis   Ceclor [Cefaclor] Other (See Comments)    Reaction=burning  all over   Penicillins Anaphylaxis    "CLOSES OFF MY BREATHING" Has patient had a PCN reaction causing immediate rash, facial/tongue/throat swelling, SOB or lightheadedness with hypotension: Yes Has patient had a PCN reaction causing severe rash involving mucus membranes or skin necrosis: No Has patient had a PCN reaction that required  hospitalization No Has patient had a PCN reaction occurring within the last 10 years: No If all of the above answers are "NO", then may proceed with Cephalosporin use.    Pneumococcal Vaccines Other (See Comments)    PP-23 vaccine; had BIG local red reaction with heat.    Codeine Nausea And Vomiting   Cortisone Hives    All over body   Boniva [Ibandronate Sodium] Other (See Comments)    Jaw popping     Review of Systems: All systems reviewed and negative except where noted in HPI.    No results found.  Physical Exam: BP 126/68    Pulse 72    Ht 5' (1.524 m)    Wt 159 lb (72.1 kg)    BMI 31.05 kg/m  Constitutional: Pleasant,well-developed, Caucasian female in no acute distress. HEENT: Normocephalic and atraumatic. Conjunctivae are normal. No scleral icterus. Neck supple.  Cardiovascular: Normal rate, regular rhythm.  Pulmonary/chest: Effort normal and breath sounds normal. No wheezing, rales or rhonchi. Abdominal: Soft, nondistended, nontender. Bowel sounds active throughout. There are no masses palpable. No hepatomegaly. Extremities: no edema Neurological: Alert and oriented to person place and time. Skin: Skin is warm and dry. No rashes noted. Psychiatric: Normal mood and affect. Behavior is normal.  CBC    Component Value Date/Time   WBC 6.4 10/03/2019 1526   RBC 3.96 10/03/2019 1526   HGB 12.3 10/03/2019 1526   HCT 37.5 10/03/2019 1526   PLT 182 10/03/2019 1526   MCV 94.7 10/03/2019 1526   MCV 85.5 12/07/2012 1455   MCH 31.1 10/03/2019 1526   MCHC 32.8 10/03/2019 1526   RDW 12.9 10/03/2019 1526   LYMPHSABS 1.9 03/24/2018 2145   MONOABS 0.6 03/24/2018 2145   EOSABS 0.1 03/24/2018 2145   BASOSABS 0.0 03/24/2018 2145    CMP     Component Value Date/Time   NA 138 02/02/2021 1411   K 4.3 02/02/2021 1411   CL 103 02/02/2021 1411   CO2 23 02/02/2021 1411   GLUCOSE 171 (H) 02/02/2021 1411   GLUCOSE 177 (H) 10/03/2019 1526   BUN 10 02/02/2021 1411   CREATININE  0.69 02/02/2021 1411   CREATININE 0.63 12/07/2012 1445   CALCIUM 9.2 02/02/2021 1411   PROT 6.9 03/25/2018 0440   ALBUMIN 4.0 03/25/2018 0440   AST 20 03/25/2018 0440   ALT 21 03/25/2018 0440   ALKPHOS 73 03/25/2018 0440   BILITOT 0.4 03/25/2018 0440   GFRNONAA >60 10/03/2019 1526   GFRNONAA >89 12/07/2012 1445   GFRAA >60 10/03/2019 1526   GFRAA >89 12/07/2012 1445  Others [facility] Component    Test Result   Component 06/01/2021     Test Result Positive Abnormal        ASSESSMENT AND PLAN: 71 year old female with history of atrial fibrillation, COPD and CHF here for positive Cologuard.  She denies any concerning GI symptoms.  She had a colonoscopy over 6 years ago with 2 small tubular adenomas removed.  She has chronic dyspnea and poor exercise intolerance, but is not oxygen dependent and had a reassuring echo and CTA within the past year.  I think the patient is okay to  proceed with a colonoscopy in the [place]. She has chronic GERD symptoms which are adequately controlled with medication and dietary modification.  Although there is a reported history of Barrett's and her medical history, there is no evidence of Barrett's on her most recent upper endoscopy.  I do not see a reason to repeat an upper endoscopy at this time. The patient is on Eliquis for atrial fibrillation.  I recommend that she hold this drug for 2 days prior to her colonoscopy.  We will reach out to her prescribing cardiologist to ensure there are no objections to this recommendation.  Positive Cologuard -Colonoscopy - Hold Eliquis 2 days prior to colonoscopy  The details, risks (including bleeding, perforation, infection, missed lesions, medication reactions and possible hospitalization or surgery if complications occur), benefits, and alternatives to colonoscopy with possible biopsy and possible polypectomy were discussed with the patient and she consents to proceed.   [name] [name]  E. Candis Schatz, [facility]   Velna Hatchet, MD

## 2021-08-17 NOTE — Patient Instructions (Signed)
If you are age 69 or older, your body mass index should be between 23-30. Your Body mass index is 31.05 kg/m. If this is out of the aforementioned range listed, please consider follow up with your Primary Care Provider.  If you are age 27 or younger, your body mass index should be between 19-25. Your Body mass index is 31.05 kg/m. If this is out of the aformentioned range listed, please consider follow up with your Primary Care Provider.   You have been scheduled for a colonoscopy. Please follow written instructions given to you at your visit today.  Please pick up your prep supplies at the pharmacy within the next 1-3 days. If you use inhalers (even only as needed), please bring them with you on the day of your procedure.   The [facility] providers would like to encourage you to use [facility] to communicate with providers for non-urgent requests or questions.  Due to long hold times on the telephone, sending your provider a message by [facility] may be a faster and more efficient way to get a response.  Please allow 48 business hours for a response.  Please remember that this is for non-urgent requests.   It was a pleasure to see you today!  Thank you for trusting me with your gastrointestinal care!    Scott E.Candis Schatz , MD

## 2021-08-18 ENCOUNTER — Telehealth: Payer: Self-pay

## 2021-08-18 ENCOUNTER — Encounter: Payer: Self-pay | Admitting: Gastroenterology

## 2021-08-18 NOTE — Telephone Encounter (Signed)
[facility] Pre-operative Risk Assessment     Request for surgical clearance:     Endoscopy Procedure  What type of surgery is being performed?     Colonoscopy   When is this surgery scheduled?     09/13/21  What type of clearance is required ?   Pharmacy  Are there any medications that need to be held prior to surgery and how long? Eliquis 2 days.  Practice name and name of physician performing surgery?      [facility]  What is your office phone and fax number?      Phone- [phone]  [phone]  Anesthesia type (None, local, MAC, general) ?       MAC

## 2021-08-18 NOTE — Telephone Encounter (Signed)
Patient with diagnosis of afib on Eliquis for anticoagulation.    Procedure: colonoscopy Date of procedure: 09/13/21  CHA2DS2-VASc Score = 8  This indicates a 10.8% annual risk of stroke. The patient's score is based upon: CHF History: 1 HTN History: 1 Diabetes History: 1 Stroke History: 2 Vascular Disease History: 1 Age Score: 1 Gender Score: 1  CrCl 31mL/min using adjusted body weight  Request is to hold Eliquis for 2 days. Would recommend that pt only hold for 1 day prior to procedure due to elevated CV risk including history of multiple strokes with elevated CHADS2VASc score of 8. She should resume as soon as safely possible after due to elevated CV risk.

## 2021-08-18 NOTE — Telephone Encounter (Signed)
Clinical pharmacist to review Eliquis 

## 2021-08-19 NOTE — Telephone Encounter (Signed)
° ° °  Patient Name: [name] [name]  DOB: 02-14-1951 MRN: [id number]  Primary Cardiologist: Larae Grooms, MD  Chart reviewed as part of pre-operative protocol coverage. Given past medical history and time since last visit, based on ACC/AHA guidelines, [name] [name] would be at acceptable risk for the planned procedure without further cardiovascular testing.  Recent coronary CT obtained in July 2022 showed minimal CAD.  The patient was advised that if she develops new symptoms prior to surgery to contact our office to arrange for a follow-up visit, and she verbalized understanding.  See recommendation by our clinical pharmacist, they would recommend holding Eliquis for 1 day instead of 2 days due to elevated risk for stroke.  Will defer to GI doctor to decide how early afterward she can restart on the Eliquis.  I will route this recommendation to the requesting party via Epic fax function and remove from pre-op pool.  Please call with questions.  [place], [place] 08/19/2021, 1:21 PM  08/19/2021, 1:21 PM

## 2021-09-02 ENCOUNTER — Other Ambulatory Visit: Payer: Self-pay | Admitting: Cardiology

## 2021-09-16 ENCOUNTER — Encounter: Payer: Self-pay | Admitting: Gastroenterology

## 2021-09-16 ENCOUNTER — Ambulatory Visit (AMBULATORY_SURGERY_CENTER): Payer: Medicare Other | Admitting: Gastroenterology

## 2021-09-16 VITALS — BP 122/70 | HR 63 | Temp 97.1°F | Resp 14 | Ht 60.0 in | Wt 159.0 lb

## 2021-09-16 DIAGNOSIS — Z86010 Personal history of colonic polyps: Secondary | ICD-10-CM | POA: Diagnosis not present

## 2021-09-16 DIAGNOSIS — R195 Other fecal abnormalities: Secondary | ICD-10-CM

## 2021-09-16 DIAGNOSIS — D124 Benign neoplasm of descending colon: Secondary | ICD-10-CM

## 2021-09-16 DIAGNOSIS — D122 Benign neoplasm of ascending colon: Secondary | ICD-10-CM

## 2021-09-16 MED ORDER — SODIUM CHLORIDE 0.9 % IV SOLN: 500.0000 mL | Freq: Once | INTRAVENOUS | Status: DC

## 2021-09-16 NOTE — Op Note (Signed)
[facility] Patient Name: [name] [name] Procedure Date: 09/16/2021 9:15 AM MRN: [id number] Endoscopist: Nicki Reaper E. Candis Schatz , MD Age: 71 Referring MD:  Date of Birth: [birth year] Gender: Female Account #: [id number] Procedure:                Colonoscopy Indications:              Positive Cologuard test Medicines:                Monitored Anesthesia Care Procedure:                Pre-Anesthesia Assessment:                           - Prior to the procedure, a History and Physical                            was performed, and patient medications and                            allergies were reviewed. The patient's tolerance of                            previous anesthesia was also reviewed. The risks                            and benefits of the procedure and the sedation                            options and risks were discussed with the patient.                            All questions were answered, and informed consent                            was obtained. Prior Anticoagulants: The patient has                            taken Eliquis (apixaban), last dose was 1 day prior                            to procedure. ASA Grade Assessment: III - A patient                            with severe systemic disease. After reviewing the                            risks and benefits, the patient was deemed in                            satisfactory condition to undergo the procedure.                           After obtaining informed consent, the colonoscope  was passed under direct vision. Throughout the                            procedure, the patient's blood pressure, pulse, and                            oxygen saturations were monitored continuously. The                            CF [id number] #[id number] was introduced through the anus                            and advanced to the the terminal ileum, with                            identification of  the appendiceal orifice and IC                            valve. The colonoscopy was performed without                            difficulty. The patient tolerated the procedure                            well. The quality of the bowel preparation was                            adequate. The terminal ileum, ileocecal valve,                            appendiceal orifice, and rectum were photographed. Scope In: 9:22:08 AM Scope Out: 10:00:06 AM Scope Withdrawal Time: 0 hours 30 minutes 28 seconds  Total Procedure Duration: 0 hours 37 minutes 58 seconds  Findings:                 The perianal and digital rectal examinations were                            normal. Pertinent negatives include normal                            sphincter tone and no palpable rectal lesions.                           Three sessile polyps were found in the ascending                            colon. The polyps were 2 to 4 mm in size. These                            polyps were removed with a cold snare. Resection                            and retrieval  were complete. Estimated blood loss                            was minimal.                           Two semi-pedunculated polyps were found in the                            descending colon. The polyps were 6 to 7 mm in                            size. These polyps were removed with a cold snare.                            Resection and retrieval were complete. To prevent                            bleeding after the polypectomy, two hemostatic                            clips were successfully placed (MR conditional).                            There was no bleeding at the end of the maneuver.                            Estimated blood loss was minimal.                           A few small-mouthed diverticula were found in the                            descending colon.                           The exam was otherwise normal throughout the                             examined colon.                           The terminal ileum appeared normal.                           The retroflexed view of the distal rectum and anal                            verge was normal and showed no anal or rectal                            abnormalities. Complications:            No immediate complications. Estimated Blood Loss:     Estimated blood loss was minimal. Impression:               -  Three 2 to 4 mm polyps in the ascending colon,                            removed with a cold snare. Resected and retrieved.                           - Two 6 to 7 mm polyps in the descending colon,                            removed with a cold snare. Resected and retrieved.                            Clips (MR conditional) were placed.                           - Diverticulosis in the descending colon.                           - The examined portion of the ileum was normal.                           - The distal rectum and anal verge are normal on                            retroflexion view. Recommendation:           - Patient has a contact number available for                            emergencies. The signs and symptoms of potential                            delayed complications were discussed with the                            patient. Return to normal activities tomorrow.                            Written discharge instructions were provided to the                            patient.                           - Resume previous diet.                           - Continue present medications.                           - Await pathology results.                           - Repeat colonoscopy in 3 years for surveillance.                           -  Resume Eliquis (apixaban) at prior dose on                            Saturday Lavaun Greenfield E. Candis Schatz, MD 09/16/2021 10:07:30 AM This report has been signed electronically.

## 2021-09-16 NOTE — Progress Notes (Signed)
Called to room to assist during endoscopic procedure.  Patient ID and intended procedure confirmed with present staff. Received instructions for my participation in the procedure from the performing physician.  

## 2021-09-16 NOTE — Progress Notes (Signed)
Pt's states no medical or surgical changes since previsit or office visit.  VS CW  

## 2021-09-16 NOTE — Progress Notes (Signed)
History and Physical Interval Note:  09/16/2021 9:03 AM  [name] [name]  has presented today for endoscopic procedure(s), with the diagnosis of  Encounter Diagnosis  Name Primary?   Positive colorectal cancer screening using Cologuard test Yes  .  The various methods of evaluation and treatment have been discussed with the patient and/or family. After consideration of risks, benefits and other options for treatment, the patient has consented to  the endoscopic procedure(s).   The patient's history has been reviewed, patient examined, no change in status, stable for endoscopic procedure(s).  I have reviewed the patient's chart and labs.  Questions were answered to the patient's satisfaction.    Of note, the patient took her Eliquis yesterday morning.  [name] [name] E. Candis Schatz, MD [facility]

## 2021-09-16 NOTE — Patient Instructions (Signed)
Restart Eliquis (Apixaban) at prior dose on Saturday 09/18/21  Await pathology results.  Handout on polyps and diverticulosis provided.  YOU HAD AN ENDOSCOPIC PROCEDURE TODAY AT [facility]:   Refer to the procedure report that was given to you for any specific questions about what was found during the examination.  If the procedure report does not answer your questions, please call your gastroenterologist to clarify.  If you requested that your care partner not be given the details of your procedure findings, then the procedure report has been included in a sealed envelope for you to review at your convenience later.  YOU SHOULD EXPECT: Some feelings of bloating in the abdomen. Passage of more gas than usual.  Walking can help get rid of the air that was put into your GI tract during the procedure and reduce the bloating. If you had a lower endoscopy (such as a colonoscopy or flexible sigmoidoscopy) you may notice spotting of blood in your stool or on the toilet paper. If you underwent a bowel prep for your procedure, you may not have a normal bowel movement for a few days.  Please Note:  You might notice some irritation and congestion in your nose or some drainage.  This is from the oxygen used during your procedure.  There is no need for concern and it should clear up in a day or so.  SYMPTOMS TO REPORT IMMEDIATELY:  Following lower endoscopy (colonoscopy or flexible sigmoidoscopy):  Excessive amounts of blood in the stool  Significant tenderness or worsening of abdominal pains  Swelling of the abdomen that is new, acute  Fever of 100F or higher   For urgent or emergent issues, a gastroenterologist can be reached at any hour by calling [phone]. Do not use MyChart messaging for urgent concerns.    DIET:  We do recommend a small meal at first, but then you may proceed to your regular diet.  Drink plenty of fluids but you should avoid alcoholic beverages for 24  hours.  ACTIVITY:  You should plan to take it easy for the rest of today and you should NOT DRIVE or use heavy machinery until tomorrow (because of the sedation medicines used during the test).    FOLLOW UP: Our staff will call the number listed on your records 48-72 hours following your procedure to check on you and address any questions or concerns that you may have regarding the information given to you following your procedure. If we do not reach you, we will leave a message.  We will attempt to reach you two times.  During this call, we will ask if you have developed any symptoms of COVID 19. If you develop any symptoms (ie: fever, flu-like symptoms, shortness of breath, cough etc.) before then, please call [phone].  If you test positive for Covid 19 in the 2 weeks post procedure, please call and report this information to [place].    If any biopsies were taken you will be contacted by phone or by letter within the next 1-3 weeks.  Please call us at [phone] if you have not heard about the biopsies in 3 weeks.    SIGNATURES/CONFIDENTIALITY: You and/or your care partner have signed paperwork which will be entered into your electronic medical record.  These signatures attest to the fact that that the information above on your After Visit Summary has been reviewed and is understood.  Full responsibility of the confidentiality of this discharge information lies with you and/or  your care-partner.

## 2021-09-16 NOTE — Progress Notes (Signed)
Report to PACU, RN, vss, BBS= Clear.  

## 2021-09-21 ENCOUNTER — Telehealth: Payer: Self-pay

## 2021-09-21 ENCOUNTER — Encounter: Payer: Self-pay | Admitting: Gastroenterology

## 2021-09-21 ENCOUNTER — Telehealth: Payer: Self-pay | Admitting: *Deleted

## 2021-09-21 NOTE — Telephone Encounter (Signed)
Left message on follow up call. 

## 2021-09-21 NOTE — Telephone Encounter (Signed)
°  Follow up Call-  Call back number 09/16/2021  Post procedure Call Back phone  # [phone]  Permission to leave phone message Yes  Some recent data might be hidden     Patient questions:  Do you have a fever, pain , or abdominal swelling? No. Pain Score  0 *  Have you tolerated food without any problems? Yes.    Have you been able to return to your normal activities? Yes.    Do you have any questions about your discharge instructions: Diet   No. Medications  No. Follow up visit  No.  Do you have questions or concerns about your Care? No.  Actions: * If pain score is 4 or above: No action needed, pain <4.

## 2021-11-26 ENCOUNTER — Other Ambulatory Visit: Payer: Self-pay | Admitting: Cardiology

## 2021-12-12 NOTE — Progress Notes (Signed)
? ?Cardiology Office Note ?Date:  12/12/2021  ?Patient ID:  [name], [name] May 16, 1951, MRN [id number] ?PCP:  Velna Hatchet, MD  ?Electrophysiologist: Dr. Curt Bears ? ?  ?Chief Complaint:  annual visit ? ?History of Present Illness: ?[name] [name] is a 71 y.o. female with history of COPD, HTN, HLD, DM, CVA, essential tremor, GERD, Takotsubo CM with recovered EF, AFib ? ? ?She come sin today to be seen for Dr. Curt Bears, last seen by him 11/23/20, c/o fatigue, SOB, planned for labs, update her echo ? ?Echo looked OK, LVEF 67%, no WMA, no significant VHD ? ?She saw Dr. Irish Lack July 2022, seems a work in visit for DOD with CP, planned for coronary CTa ? ?CT with mild NOD, over-read also looked ok ? ? ?TODAY:  ?She is doing well ?Lives independently, cares for her home, yard by herself. ?She reports SOB/DOE since childhood, born with asthma, all her life has had to pace herself. ?NO changes in her exertional capacity ?When working hard "like digging holes in the yard" she is aware of her heart beat feels fast but wil slow or rest and it settles very quickly.  Otherwise no cardiac concerns, or awareness. ?No near syncope or syncope. ?No rest SOB ?No bleeding or signs of bleeding ? ?AFib/AAD Hx ?Diagnosed post-op knee surgery, complicated by flash edema 2015 ?Multaq 2017 failed to maintain SR ?Flecainide Nov 2017 > stopped post ablation (seems 2/2 icLBBB) ?PVI ablation 08/03/2016 ?Nodal blockers limited 2/2 bradycardia  ? ?Past Medical History:  ?Diagnosis Date  ? Anemia   ? hx of years ago   ? Anxiety   ? Aortic valve disorders   ? Arthritis   ? Asthma   ? Barrett esophagus   ? Bipolar disorder ([place])   ? CHF (congestive heart failure) ([place])   ? Chronic bronchitis ([place])   ? Chronic kidney disease   ? "I don't have but 1" (08/03/2016)  ? Complication of anesthesia   ? COPD (chronic obstructive pulmonary disease) ([place])   ? "just have a touch" (08/03/2016)  ? Depression   ? Esophageal reflux   ? Esophageal stricture   ?  Family history of adverse reaction to anesthesia   ? brother had nausea and vomiting, mother has had nausea  ? Fibromyalgia   ? "hands, knees, elbows; back; think it's in my hips" (08/03/2016)  ? GAD (generalized anxiety disorder)   ? GERD (gastroesophageal reflux disease)   ? Hiatal hernia   ? History of blood transfusion 1970s; 01/08/2013  ? "low blood count"  ? History of kidney stones 01-08-13  ? past hx.  ? History of nuclear stress test 01/2016  ? low risk study  ? History of stomach ulcers   ? "some have bleed"  ? Hyperlipemia   ? Hypertension 12/07/2012  ? Insomnia, unspecified   ? Myocardial infarction [facility]) 01-08-13  ? '06-Chest pain-no stent-dx. MI-stress related  ? Osteoporosis   ? Personal history of colonic polyps 09/2010  ? TUBULAR ADENOMAS (X3); NEGATIVE FOR HIGH GRADE DYSPLASIA OR MALIGNANCY.  ? Pneumonia   ? "several times" (08/03/2016)  ? PONV (postoperative nausea and vomiting)   ? Stroke [facility])   ? "2 or 3"; remains with some right sided weaknes (08/03/2016)  ? Takotsubo syndrome   ? Tremor, essential 03/20/2017  ? Type II diabetes mellitus ([place])   ? Urinary frequency   ? AND INCONTINENCE  ? ? ?Past Surgical History:  ?Procedure Laterality Date  ? ANTERIOR CERVICAL DECOMP/DISCECTOMY FUSION  N/A 01/31/2019  ? Procedure: ANTERIOR CERVICAL DECOMPRESSION/DISCECTOMY FUSION C5-6;  Surgeon: Melina Schools, MD;  Location: [place];  Service: Orthopedics;  Laterality: N/A;  3 hrs  ? ATRIAL FIBRILLATION ABLATION  08/03/2016  ? CARDIAC CATHETERIZATION    ? normal per Dr Acie Fredrickson in note dated 02/06/12   ? CARDIAC CATHETERIZATION    ? "I've had 2 or 3" (08/03/2016)  ? CARPAL TUNNEL RELEASE Right   ? CATARACT EXTRACTION W/ INTRAOCULAR LENS  IMPLANT, BILATERAL    ? COLONOSCOPY W/ BIOPSIES AND POLYPECTOMY    ? DILATION AND CURETTAGE OF UTERUS    ? S/P miscarriage  ? ELECTROPHYSIOLOGIC STUDY N/A 08/03/2016  ? Procedure: Atrial Fibrillation Ablation;  Surgeon: Will Meredith Leeds, MD;  Location: [place] [facility];  Service:  Cardiovascular;  Laterality: N/A;  ? ESOPHAGEAL DILATION  01-08-13  ? 2 yrs ago  ? HEEL SPUR SURGERY Left 2005  ? KNEE ARTHROSCOPY  02/28/2012  ? Procedure: ARTHROSCOPY KNEE;  Surgeon: Tobi Bastos, MD;  Location: [facility];  Service: Orthopedics;  Laterality: Left;  ? LAPAROSCOPIC CHOLECYSTECTOMY  03/2002  ? REVERSE SHOULDER ARTHROPLASTY Right 10/10/2019  ? Procedure: REVERSE SHOULDER ARTHROPLASTY;  Surgeon: Justice Britain, MD;  Location: [facility];  Service: Orthopedics;  Laterality: Right;  131mn  ? SHOULDER OPEN ROTATOR CUFF REPAIR Right 2011  ? [place] ? TOTAL KNEE ARTHROPLASTY Left 01/17/2013  ? Procedure: LEFT TOTAL KNEE ARTHROPLASTY;  Surgeon: RTobi Bastos MD;  Location: [facility];  Service: Orthopedics;  Laterality: Left;  ? TOTAL KNEE ARTHROPLASTY Right 06/19/2014  ? Procedure: RIGHT TOTAL KNEE ARTHROPLASTY;  Surgeon: RTobi Bastos MD;  Location: [facility];  Service: Orthopedics;  Laterality: Right;  ? TUBAL LIGATION    ? VAGINAL HYSTERECTOMY    ? "partial"  ? WRIST FLEXION TENDON TENOTOMIES AND PROXIMAL CORPECTOMY W/ WRIST ARTHRODESIS&ILIAC CREST BONE GRAFT    ? ? ?Current Outpatient Medications  ?Medication Sig Dispense Refill  ? albuterol (PROVENTIL) (2.5 MG/3ML) 0.083% nebulizer solution Take 3 mLs (2.5 mg total) by nebulization every 6 (six) hours as needed for wheezing. DX  491.9, J44.9 (Patient not taking: Reported on 08/17/2021) 360 mL 12  ? ALPRAZolam (XANAX) 1 MG tablet Take 1 mg by mouth at bedtime.     ? atorvastatin (LIPITOR) 80 MG tablet Take 80 mg by mouth at bedtime.     ? azelastine (ASTELIN) 0.1 % nasal spray Place 1 spray into both nostrils at bedtime.     ? beta carotene 25000 UNIT capsule Take 25,000 Units by mouth daily.    ? cholecalciferol (VITAMIN D) 1000 UNITS tablet Take 1,000 Units by mouth daily.     ? cyclobenzaprine (FLEXERIL) 10 MG tablet Take 1 tablet (10 mg total) by mouth 3 (three) times daily as needed for muscle spasms. 40 tablet 1  ?  dexlansoprazole (DEXILANT) 60 MG capsule Take 60 mg by mouth daily.    ? docusate sodium (COLACE) 100 MG capsule Take 100 mg by mouth 2 (two) times daily.    ? ELIQUIS 5 MG TABS tablet TAKE 1 TABLET BY MOUTH TWICE DAILY 60 tablet 5  ? EPINEPHrine (EPIPEN 2-PAK) 0.3 mg/0.3 mL IJ SOAJ injection USE AS DIRECTED 2 each 12  ? Flaxseed, Linseed, (FLAXSEED OIL) 1200 MG CAPS Take 1,200 mg by mouth at bedtime.    ? furosemide (LASIX) 20 MG tablet TAKE 1 TABLET BY MOUTH EVERY DAY 90 tablet 3  ? glipiZIDE (GLUCOTROL) 5 MG tablet Take  5 mg by mouth 2 (two) times daily before a meal.    ? isosorbide mononitrate (IMDUR) 30 MG 24 hr tablet TAKE 1 TABLET BY MOUTH DAILY. 17 tablet 0  ? montelukast (SINGULAIR) 10 MG tablet Take 1 tablet (10 mg total) by mouth at bedtime. 30 tablet 11  ? mupirocin ointment (BACTROBAN) 2 % Apply 2 application topically 2 (two) times daily as needed.    ? PARoxetine (PAXIL) 30 MG tablet Take 30 mg by mouth at bedtime.   1  ? primidone (MYSOLINE) 50 MG tablet Take 50 mg by mouth at bedtime. Take 50 mg by mouth at bedtime.    ? PROAIR HFA 108 (90 Base) MCG/ACT inhaler INHALE TWO PUFFS INTO THE LUNGS EVERY 6 HOURS AS NEEDED FOR WHEEZING OR SHORTNESS OF BREATH 8.5 g 11  ? traMADol (ULTRAM) 50 MG tablet Take 1 tablet (50 mg total) by mouth every 6 (six) hours as needed. 28 tablet 0  ? vitamin B-12 (CYANOCOBALAMIN) 1000 MCG tablet Take 1,000 mcg by mouth every evening.     ? ?No current facility-administered medications for this visit.  ? ?Facility-Administered Medications Ordered in Other Visits  ?Medication Dose Route Frequency Provider Last Rate Last Admin  ? tranexamic acid (CYKLOKAPRON) 2,000 mg in sodium chloride 0.9 % 50 mL Topical Application  0,454 mg Topical Once [place], [employer], PA-C      ? ? ?Allergies:   Bee venom, Ceclor [cefaclor], Penicillins, Pneumococcal vaccines, Codeine, Cortisone, and Boniva [ibandronate sodium]  ? ?Social History:  The patient  reports that she quit smoking about 2  years ago. Her smoking use included cigarettes. She has a 25.00 pack-year smoking history. She quit smokeless tobacco use about 9 years ago.  Her smokeless tobacco use included snuff and chew. She reports current alcohol use.

## 2021-12-15 ENCOUNTER — Ambulatory Visit (INDEPENDENT_AMBULATORY_CARE_PROVIDER_SITE_OTHER): Payer: Medicare Other | Admitting: Physician Assistant

## 2021-12-15 ENCOUNTER — Encounter: Payer: Self-pay | Admitting: Physician Assistant

## 2021-12-15 VITALS — BP 134/68 | HR 70 | Ht 60.0 in | Wt 161.4 lb

## 2021-12-15 DIAGNOSIS — I5181 Takotsubo syndrome: Secondary | ICD-10-CM

## 2021-12-15 DIAGNOSIS — I10 Essential (primary) hypertension: Secondary | ICD-10-CM | POA: Diagnosis not present

## 2021-12-15 DIAGNOSIS — N189 Chronic kidney disease, unspecified: Secondary | ICD-10-CM | POA: Insufficient documentation

## 2021-12-15 DIAGNOSIS — I480 Paroxysmal atrial fibrillation: Secondary | ICD-10-CM | POA: Diagnosis not present

## 2021-12-15 MED ORDER — ISOSORBIDE MONONITRATE ER 30 MG PO TB24: 30.0000 mg | ORAL_TABLET | Freq: Every day | ORAL | 3 refills | Status: DC

## 2021-12-15 NOTE — Patient Instructions (Signed)
Medication Instructions:  ? ?Your physician recommends that you continue on your current medications as directed. Please refer to the Current Medication list given to you today. ? ?*If you need a refill on your cardiac medications before your next appointment, please call your pharmacy* ? ? ?Lab Work:NONE ORDERED  TODAY ? ? ?If you have labs (blood work) drawn today and your tests are completely normal, you will receive your results only by: ?MyChart Message (if you have MyChart) OR ?A paper copy in the mail ?If you have any lab test that is abnormal or we need to change your treatment, we will call you to review the results. ? ? ?Testing/Procedures: NONE ORDERED  TODAY ? ? ? ?Follow-Up: ?At [facility], you and your health needs are our priority.  As part of our continuing mission to provide you with exceptional heart care, we have created designated Provider Care Teams.  These Care Teams include your primary Cardiologist (physician) and Advanced Practice Providers (APPs -  Physician Assistants and Nurse Practitioners) who all work together to provide you with the care you need, when you need it. ? ?We recommend signing up for the patient portal called "MyChart".  Sign up information is provided on this After Visit Summary.  MyChart is used to connect with patients for Virtual Visits (Telemedicine).  Patients are able to view lab/test results, encounter notes, upcoming appointments, etc.  Non-urgent messages can be sent to your provider as well.   ?To learn more about what you can do with MyChart, go to [email or web address].   ? ?Your next appointment:   ?1 year(s) ? ?The format for your next appointment:   ?In Person ? ?Provider:   ?You may see Will Meredith Leeds, MD or one of the following Advanced Practice Providers on your designated Care Team:   ?Tommye Standard, PA-C ? ? ? ? ? ?Other Instructions ? ? ?Important Information About Sugar ? ? ? ? ?  ?

## 2022-01-08 ENCOUNTER — Other Ambulatory Visit: Payer: Self-pay | Admitting: Cardiology

## 2022-03-17 ENCOUNTER — Ambulatory Visit (INDEPENDENT_AMBULATORY_CARE_PROVIDER_SITE_OTHER): Payer: Medicare Other | Admitting: Interventional Cardiology

## 2022-03-17 ENCOUNTER — Encounter: Payer: Self-pay | Admitting: Interventional Cardiology

## 2022-03-17 VITALS — BP 138/74 | HR 71 | Ht 60.0 in | Wt 158.2 lb

## 2022-03-17 DIAGNOSIS — I480 Paroxysmal atrial fibrillation: Secondary | ICD-10-CM

## 2022-03-17 DIAGNOSIS — Z7901 Long term (current) use of anticoagulants: Secondary | ICD-10-CM | POA: Diagnosis not present

## 2022-03-17 DIAGNOSIS — R079 Chest pain, unspecified: Secondary | ICD-10-CM

## 2022-03-17 DIAGNOSIS — I2510 Atherosclerotic heart disease of native coronary artery without angina pectoris: Secondary | ICD-10-CM | POA: Diagnosis not present

## 2022-03-17 DIAGNOSIS — I2584 Coronary atherosclerosis due to calcified coronary lesion: Secondary | ICD-10-CM

## 2022-03-17 MED ORDER — LOSARTAN POTASSIUM 25 MG PO TABS: 12.5000 mg | ORAL_TABLET | Freq: Every day | ORAL | 3 refills | Status: DC

## 2022-03-17 NOTE — Patient Instructions (Signed)
Medication Instructions:  Your physician recommends that you continue on your current medications as directed. Please refer to the Current Medication list given to you today.  *If you need a refill on your cardiac medications before your next appointment, please call your pharmacy*  Testing/Procedures: ECHO Your physician has requested that you have an echocardiogram. Echocardiography is a painless test that uses sound waves to create images of your heart. It provides your doctor with information about the size and shape of your heart and how well your heart's chambers and valves are working. This procedure takes approximately one hour. There are no restrictions for this procedure.    Follow-Up: At [facility], you and your health needs are our priority.  As part of our continuing mission to provide you with exceptional heart care, we have created designated Provider Care Teams.  These Care Teams include your primary Cardiologist (physician) and Advanced Practice Providers (APPs -  Physician Assistants and Nurse Practitioners) who all work together to provide you with the care you need, when you need it.  We recommend signing up for the patient portal called "MyChart".  Sign up information is provided on this After Visit Summary.  MyChart is used to connect with patients for Virtual Visits (Telemedicine).  Patients are able to view lab/test results, encounter notes, upcoming appointments, etc.  Non-urgent messages can be sent to your provider as well.   To learn more about what you can do with MyChart, go to [email or web address].    Your next appointment:   3 month(s)  The format for your next appointment:   In Person  Provider:   Larae Grooms, MD {

## 2022-03-17 NOTE — Progress Notes (Signed)
Cardiology Office Note   Date:  03/17/2022   ID:  [name], [name] Dec 19, 1950, MRN [id number]  PCP:  [name] Hatchet, MD    No chief complaint on file.  Chest pressure  Wt Readings from Last 3 Encounters:  03/17/22 158 lb 3.2 oz (71.8 kg)  12/15/21 161 lb 6.4 oz (73.2 kg)  09/16/21 159 lb (72.1 kg)       History of Present Illness: [name] [name] is a 71 y.o. female who is being seen today for the evaluation of chest pressure at the request of [name] Hatchet, MD. She has had AFib in the past.  She has had ablation.   H/o Takotsubo cardiomyopathy.     Prior records from Dr. Curt Bears show: "She has a history significant for paroxysmal atrial fibrillation, type of supra cardiomyopathy, hyperlipidemia.  Her ejection fraction has since normalized from a low of 15 to 20%.  She was admitted to the hospital and was noted to have atrial fibrillation.  She is status post ablation 08/03/2016."   2019 monitor showed: "Sinus rhythm with sinus tachycardia.   Patient triggers were associated with sinus rhythm. Short runs of SVT, maximum 20 beats without symptoms"   She had more SHOB noted in 4/22.  Echo in 5/22 showed: "Left ventricular ejection fraction by 3D volume is 67 %. The left  ventricle has normal function. The left ventricle has no regional wall  motion abnormalities. Left ventricular diastolic parameters were normal.   2. Right ventricular systolic function is normal. The right ventricular  size is normal. There is normal pulmonary artery systolic pressure.   3. The mitral valve is normal in structure. Trivial mitral valve  regurgitation. No evidence of mitral stenosis.   4. The aortic valve is normal in structure. Aortic valve regurgitation is  not visualized. No aortic stenosis is present.   5. The inferior vena cava is normal in size with greater than 50%  respiratory variability, suggesting right atrial pressure of 3 mmHg. "    She has had some intermittent  chest tightness over the past month in July 2023.  It is typically worse with emotional stress.  The heat has decreased her activity level.   Denies : . Dizziness. Leg edema. Nitroglycerin use. Orthopnea. Palpitations. Paroxysmal nocturnal dyspnea. Shortness of breath. Syncope.    She has not had sx of AFib.   She had similar sx in 7/22.  CTA of coronaries only showed mild CAD.    Sx are stable.    Past Medical History:  Diagnosis Date   Anemia    hx of years ago    Anxiety    Aortic valve disorders    Arthritis    Asthma    Barrett esophagus    Bipolar disorder (HCC)    CHF (congestive heart failure) (HCC)    Chronic bronchitis (HCC)    Complication of anesthesia    COPD (chronic obstructive pulmonary disease) ([place])    "just have a touch" (08/03/2016)   Depression    Esophageal reflux    Esophageal stricture    Family history of adverse reaction to anesthesia    brother had nausea and vomiting, mother has had nausea   Fibromyalgia    "hands, knees, elbows; back; think it's in my hips" (08/03/2016)   GAD (generalized anxiety disorder)    GERD (gastroesophageal reflux disease)    Hiatal hernia    History of blood transfusion 1970s; 01/08/2013   "low blood count"  History of kidney stones 01/08/2013   past hx.   History of nuclear stress test 01/2016   low risk study   History of stomach ulcers    "some have bleed"   Hyperlipemia    Hypertension 12/07/2012   Insomnia, unspecified    Myocardial infarction [facility]) 01/08/2013   '06-Chest pain-no stent-dx. MI-stress related   Osteoporosis    Personal history of colonic polyps 09/2010   TUBULAR ADENOMAS (X3); NEGATIVE FOR HIGH GRADE DYSPLASIA OR MALIGNANCY.   Pneumonia    "several times" (08/03/2016)   PONV (postoperative nausea and vomiting)    Stroke ([place])    "2 or 3"; remains with some right sided weaknes (08/03/2016)   Takotsubo syndrome    Tremor, essential 03/20/2017   Type II diabetes mellitus ([place])    Urinary  frequency    AND INCONTINENCE    Past Surgical History:  Procedure Laterality Date   ANTERIOR CERVICAL DECOMP/DISCECTOMY FUSION N/A 01/31/2019   Procedure: ANTERIOR CERVICAL DECOMPRESSION/DISCECTOMY FUSION C5-6;  Surgeon: Melina Schools, MD;  Location: [facility];  Service: Orthopedics;  Laterality: N/A;  3 hrs   ATRIAL FIBRILLATION ABLATION  08/03/2016   CARDIAC CATHETERIZATION     normal per Dr Acie Fredrickson in note dated 02/06/12    CARDIAC CATHETERIZATION     "I've had 2 or 3" (08/03/2016)   CARPAL TUNNEL RELEASE Right    CATARACT EXTRACTION W/ INTRAOCULAR LENS  IMPLANT, BILATERAL     COLONOSCOPY W/ BIOPSIES AND POLYPECTOMY     DILATION AND CURETTAGE OF UTERUS     S/P miscarriage   ELECTROPHYSIOLOGIC STUDY N/A 08/03/2016   Procedure: Atrial Fibrillation Ablation;  Surgeon: Will Meredith Leeds, MD;  Location: [facility];  Service: Cardiovascular;  Laterality: N/A;   ESOPHAGEAL DILATION  01-08-13   2 yrs ago   HEEL SPUR SURGERY Left 2005   KNEE ARTHROSCOPY  02/28/2012   Procedure: ARTHROSCOPY KNEE;  Surgeon: Tobi Bastos, MD;  Location: [facility];  Service: Orthopedics;  Laterality: Left;   LAPAROSCOPIC CHOLECYSTECTOMY  03/2002   REVERSE SHOULDER ARTHROPLASTY Right 10/10/2019   Procedure: REVERSE SHOULDER ARTHROPLASTY;  Surgeon: Justice Britain, MD;  Location: [facility];  Service: Orthopedics;  Laterality: Right;  135mn   SHOULDER OPEN ROTATOR CUFF REPAIR Right 2011   TONSILLECTOMY AND ADENOIDECTOMY  1965   TOTAL KNEE ARTHROPLASTY Left 01/17/2013   Procedure: LEFT TOTAL KNEE ARTHROPLASTY;  Surgeon: RTobi Bastos MD;  Location: [facility];  Service: Orthopedics;  Laterality: Left;   TOTAL KNEE ARTHROPLASTY Right 06/19/2014   Procedure: RIGHT TOTAL KNEE ARTHROPLASTY;  Surgeon: RTobi Bastos MD;  Location: [facility];  Service: Orthopedics;  Laterality: Right;   TUBAL LIGATION     VAGINAL HYSTERECTOMY     "partial"   WRIST FLEXION TENDON TENOTOMIES AND PROXIMAL CORPECTOMY W/ WRIST ARTHRODESIS&ILIAC  CREST BONE GRAFT       Current Outpatient Medications  Medication Sig Dispense Refill   albuterol (PROVENTIL) (2.5 MG/3ML) 0.083% nebulizer solution Take 3 mLs (2.5 mg total) by nebulization every 6 (six) hours as needed for wheezing. DX  491.9, J44.9 360 mL 12   ALPRAZolam (XANAX) 1 MG tablet Take 1 mg by mouth at bedtime.      atorvastatin (LIPITOR) 80 MG tablet Take 80 mg by mouth at bedtime.      azelastine (ASTELIN) 0.1 % nasal spray Place 1 spray into both nostrils at bedtime.      beta carotene 25000 UNIT capsule Take 25,000 Units by mouth daily.  cholecalciferol (VITAMIN D) 1000 UNITS tablet Take 1,000 Units by mouth daily.      dexlansoprazole (DEXILANT) 60 MG capsule Take 60 mg by mouth daily.     docusate sodium (COLACE) 100 MG capsule Take 100 mg by mouth 2 (two) times daily.     ELIQUIS 5 MG TABS tablet TAKE 1 TABLET BY MOUTH TWICE DAILY 60 tablet 5   EPINEPHrine (EPIPEN 2-PAK) 0.3 mg/0.3 mL IJ SOAJ injection USE AS DIRECTED 2 each 12   Flaxseed, Linseed, (FLAXSEED OIL) 1200 MG CAPS Take 1,200 mg by mouth at bedtime.     furosemide (LASIX) 20 MG tablet TAKE 1 TABLET EVERY DAY 90 tablet 3   glipiZIDE (GLUCOTROL) 5 MG tablet Take 5 mg by mouth 2 (two) times daily before a meal.     isosorbide mononitrate (IMDUR) 30 MG 24 hr tablet Take 1 tablet (30 mg total) by mouth daily. 90 tablet 3   montelukast (SINGULAIR) 10 MG tablet Take 1 tablet (10 mg total) by mouth at bedtime. 30 tablet 11   mupirocin ointment (BACTROBAN) 2 % Apply 2 application topically 2 (two) times daily as needed.     PARoxetine (PAXIL) 40 MG tablet Take 40 mg by mouth every morning.     primidone (MYSOLINE) 50 MG tablet Take 50 mg by mouth at bedtime. Take 50 mg by mouth at bedtime.     PROAIR HFA 108 (90 Base) MCG/ACT inhaler INHALE TWO PUFFS INTO THE LUNGS EVERY 6 HOURS AS NEEDED FOR WHEEZING OR SHORTNESS OF BREATH 8.5 g 11   vitamin B-12 (CYANOCOBALAMIN) 1000 MCG tablet Take 1,000 mcg by mouth every  evening.      losartan (COZAAR) 25 MG tablet Take 0.5 tablets (12.5 mg total) by mouth at bedtime. 90 tablet 3   No current facility-administered medications for this visit.   Facility-Administered Medications Ordered in Other Visits  Medication Dose Route Frequency Provider Last Rate Last Admin   tranexamic acid (CYKLOKAPRON) 2,000 mg in sodium chloride 0.9 % 50 mL Topical Application  8,756 mg Topical Once [place], [employer], PA-C        Allergies:   Bee venom, Ceclor [cefaclor], Penicillins, Pneumococcal vaccines, Codeine, Cortisone, and Boniva [ibandronate sodium]    Social History:  The patient  reports that she quit smoking about 2 years ago. Her smoking use included cigarettes. She has a 25.00 pack-year smoking history. She quit smokeless tobacco use about 10 years ago.  Her smokeless tobacco use included snuff and chew. She reports current alcohol use. She reports that she does not use drugs.   Family History:  The patient's family history includes Breast cancer in her maternal aunt, maternal grandmother, and mother; Colon cancer (age of onset: 12) in her brother; Colon polyps in her brother; Diabetes in her maternal grandfather; Heart attack in her father; Heart disease in an other family member; Heart failure in her brother and father; Kidney disease in an other family member; Stroke in her mother; Transient ischemic attack in her mother.    ROS:  Please see the history of present illness.   Otherwise, review of systems are positive for emotional stress.   All other systems are reviewed and negative.    PHYSICAL EXAM: VS:  BP 138/74   Pulse 71   Ht 5' (1.524 m)   Wt 158 lb 3.2 oz (71.8 kg)   SpO2 97%   BMI 30.90 kg/m  , BMI Body mass index is 30.9 kg/m. GEN: Well nourished, well developed, in no  acute distress HEENT: normal Neck: no JVD, carotid bruits, or masses Cardiac: RRR; no murmurs, rubs, or gallops,no edema  Respiratory:  clear to auscultation bilaterally, normal  work of breathing GI: soft, nontender, nondistended, + BS MS: no deformity or atrophy Skin: warm and dry, no rash Neuro:  Strength and sensation are intact Psych: euthymic mood, full affect   EKG:   The ekg ordered today demonstrates sinus rhythm, low voltage, Q-wave morphology in V1 and V2 which is different than prior   Recent Labs: No results found for requested labs within last 365 days.   Lipid Panel    Component Value Date/Time   CHOL 127 02/25/2016 0313   CHOL 129 12/07/2012 1445   TRIG 173 (H) 02/25/2016 0313   TRIG 147 12/07/2012 1445   HDL 25 (L) 02/25/2016 0313   HDL 40 12/07/2012 1445   CHOLHDL 5.1 02/25/2016 0313   VLDL 35 02/25/2016 0313   LDLCALC 67 02/25/2016 0313   LDLCALC 60 12/07/2012 1445     Other studies Reviewed: Additional studies/ records that were reviewed today with results demonstrating: Cholesterol 111 HDL 35 LDL 55 triglycerides 103.   ASSESSMENT AND PLAN:  Coronary calcification: Some atypical chest pain.  She had a reassuring coronary CTA last year.  She has been under a lot of stress and this is the primary trigger of her chest pain.  On Imdur. Atrial fibrillation: Status post ablation.  She is in a regular rhythm which appears to be sinus.  Q waves noted in V1 and V2 which is a slightly different morphology.  Check echocardiogram.  Low voltage is also noted. Anticoagulated: Eliquis for stroke prevention. Hypertension: The current medical regimen is effective;  continue present plan and medications.    Current medicines are reviewed at length with the patient today.  The patient concerns regarding her medicines were addressed.  The following changes have been made:  No change  Labs/ tests ordered today include: echo  Orders Placed This Encounter  Procedures   EKG 12-Lead   ECHOCARDIOGRAM COMPLETE    Recommend 150 minutes/week of aerobic exercise Low fat, low carb, high fiber diet recommended  Disposition:   FU in 3-4 months,  echo   Signed, Larae Grooms, MD  03/17/2022 3:00 PM    [address], [place], [place]  [address] Phone: [phone]; Fax: [phone]

## 2022-03-30 ENCOUNTER — Other Ambulatory Visit (HOSPITAL_COMMUNITY): Payer: Self-pay | Admitting: *Deleted

## 2022-03-31 ENCOUNTER — Ambulatory Visit (HOSPITAL_COMMUNITY)
Admission: RE | Admit: 2022-03-31 | Discharge: 2022-03-31 | Disposition: A | Discharge status: Home / Self Care | Payer: Medicare Other | Source: Ambulatory Visit | Attending: Internal Medicine | Admitting: Internal Medicine

## 2022-03-31 DIAGNOSIS — M810 Age-related osteoporosis without current pathological fracture: Secondary | ICD-10-CM | POA: Diagnosis present

## 2022-03-31 MED ORDER — DENOSUMAB 60 MG/ML ~~LOC~~ SOSY
PREFILLED_SYRINGE | SUBCUTANEOUS | Status: AC
  Administered 2022-03-31: 60 mg via SUBCUTANEOUS
  Filled 2022-03-31: qty 1

## 2022-04-07 ENCOUNTER — Ambulatory Visit (HOSPITAL_COMMUNITY): Payer: Medicare Other | Attending: Interventional Cardiology

## 2022-04-07 DIAGNOSIS — R079 Chest pain, unspecified: Secondary | ICD-10-CM | POA: Diagnosis present

## 2022-04-07 LAB — ECHOCARDIOGRAM COMPLETE
Area-P 1/2: 4.41 cm2
S' Lateral: 2.6 cm

## 2022-05-24 ENCOUNTER — Telehealth: Payer: Self-pay

## 2022-05-24 DIAGNOSIS — Z7901 Long term (current) use of anticoagulants: Secondary | ICD-10-CM

## 2022-05-24 DIAGNOSIS — I480 Paroxysmal atrial fibrillation: Secondary | ICD-10-CM

## 2022-05-24 DIAGNOSIS — Z01818 Encounter for other preprocedural examination: Secondary | ICD-10-CM

## 2022-05-24 NOTE — Telephone Encounter (Signed)
Pt will have labs drawn at visit with Dr. Irish Lack on 06/07/22

## 2022-05-24 NOTE — Telephone Encounter (Signed)
   Patient Name: [name] [name]  DOB: 05/27/1951 MRN: [id number]  Primary Cardiologist: Larae Grooms, MD  Chart reviewed as part of pre-operative protocol coverage. Per pharmacy review, she is overdue for labs including CBC BMP. Will route to call back team for assistance in scheduling.   Loel Dubonnet, NP 05/24/2022, 4:14 PM

## 2022-05-24 NOTE — Telephone Encounter (Signed)
Patient with diagnosis of A Fib on Eliquis for anticoagulation.    Procedure: Lt Reverse Shoulder Arthroplasty  Date of procedure: 07/14/22   CHA2DS2-VASc Score = 8  This indicates a 10.8% annual risk of stroke. The patient's score is based upon: CHF History: 1 HTN History: 1 Diabetes History: 1 Stroke History: 2 Vascular Disease History: 1 Age Score: 1 Gender Score: 1   CrCl overdue Platelet count overdue  Needs updated CBC and BMP before clearance can be completed  **This guidance is not considered finalized until pre-operative APP has relayed final recommendations.**

## 2022-05-24 NOTE — Telephone Encounter (Signed)
   Pre-operative Risk Assessment    Patient Name: [name] [name]  DOB: 18-Oct-1950 MRN: [id number]     Request for Surgical Clearance    Procedure:  Lt Reverse Shoulder Arthroplasty   Date of Surgery:  Clearance 07/14/22                                 Surgeon:  Dr. Justice Britain  Surgeon's Group or Practice Name:  [facility]  Phone number:  [phone] Fax number:  [phone]   Type of Clearance Requested:   - Pharmacy:  Hold Apixaban (Eliquis)     Type of Anesthesia:  General    Additional requests/questions:    Oneal Grout   05/24/2022, 3:13 PM

## 2022-05-24 NOTE — Telephone Encounter (Signed)
Please comment on Eliquis prior to shoulder arthroplasty 07/14/22.  TY!

## 2022-05-25 NOTE — Telephone Encounter (Signed)
Can we please call to see if patient is willing to have labs prior to 06/07/22 appointment? That way pharmacy team can provide guidance on [facility] prior to visit with Dr. Irish Lack. If patient declines, may do labs 06/07/22 instead.   Loel Dubonnet, NP

## 2022-05-25 NOTE — Addendum Note (Signed)
Addended by: Michae Kava on: 05/25/2022 10:57 AM   Modules accepted: Orders

## 2022-05-25 NOTE — Telephone Encounter (Addendum)
Pt agreeable to come in 06/03/22 for lab work , so that Dr. Irish Lack may have results when he see's the pt. I will make lab appt 06/03/22.   Pt will see Dr. Irish Lack 06/07/22 for pre op clearance for surgery in 07/2022.

## 2022-05-31 NOTE — Telephone Encounter (Signed)
If liver and lipids not drawn in the past year by PMD, would also add those tests to the upcoming labs.

## 2022-06-01 NOTE — Addendum Note (Signed)
Addended by: Stephani Police on: 06/01/2022 08:27 AM   Modules accepted: Orders

## 2022-06-01 NOTE — Telephone Encounter (Addendum)
Labs added to her lab appt.

## 2022-06-03 ENCOUNTER — Other Ambulatory Visit: Payer: Medicare Other

## 2022-06-06 NOTE — Progress Notes (Unsigned)
Cardiology Office Note   Date:  06/07/2022   ID:  [name], [name] October 30, 1950, MRN [id number]  PCP:  Velna Hatchet, MD    No chief complaint on file.  Coronary artery calcification  Wt Readings from Last 3 Encounters:  06/07/22 161 lb 12.8 oz (73.4 kg)  03/17/22 158 lb 3.2 oz (71.8 kg)  12/15/21 161 lb 6.4 oz (73.2 kg)       History of Present Illness: [name] [name] is a 71 y.o. female  H/o Takotsubo cardiomyopathy.     Prior records from Dr. Curt Bears show: "She has a history significant for paroxysmal atrial fibrillation, type of supra cardiomyopathy, hyperlipidemia.  Her ejection fraction has since normalized from a low of 15 to 20%.  She was admitted to the hospital and was noted to have atrial fibrillation.  She is status post ablation 08/03/2016."   2019 monitor showed: "Sinus rhythm with sinus tachycardia.   Patient triggers were associated with sinus rhythm. Short runs of SVT, maximum 20 beats without symptoms"   She had more SHOB noted in 4/22.  Echo in 5/22 showed: "Left ventricular ejection fraction by 3D volume is 67 %. The left  ventricle has normal function. The left ventricle has no regional wall  motion abnormalities. Left ventricular diastolic parameters were normal.   2. Right ventricular systolic function is normal. The right ventricular  size is normal. There is normal pulmonary artery systolic pressure.   3. The mitral valve is normal in structure. Trivial mitral valve  regurgitation. No evidence of mitral stenosis.   4. The aortic valve is normal in structure. Aortic valve regurgitation is  not visualized. No aortic stenosis is present.   5. The inferior vena cava is normal in size with greater than 50%  respiratory variability, suggesting right atrial pressure of 3 mmHg. "    7/22:  CTA of coronaries only showed mild CAD.        She has had some intermittent chest tightness over the past month in July 2023.  It is typically worse with  emotional stress.  The heat has decreased her activity level.   ECG changes noted in 2023.  Echocardiogram showed: "Left ventricular ejection fraction, by estimation, is 60 to 65%. The  left ventricle has normal function. The left ventricle has no regional  wall motion abnormalities. Left ventricular diastolic parameters were  normal.   2. Right ventricular systolic function is normal. The right ventricular  size is normal.   3. The mitral valve is normal in structure. No evidence of mitral valve  regurgitation. No evidence of mitral stenosis.   4. The aortic valve is normal in structure. Aortic valve regurgitation is  not visualized. No aortic stenosis is present.   5. The inferior vena cava is normal in size with greater than 50%  respiratory variability, suggesting right atrial pressure of 3 mmHg. "  She needs a left shoulder replacement.   She has had some Shortness of breath, which is usual for her at this time of the year due to allergies. Improved with Mucinex.    Past Medical History:  Diagnosis Date   Anemia    hx of years ago    Anxiety    Aortic valve disorders    Arthritis    Asthma    Barrett esophagus    Bipolar disorder (HCC)    CHF (congestive heart failure) (HCC)    Chronic bronchitis (HCC)    Complication of anesthesia  COPD (chronic obstructive pulmonary disease) ([place])    "just have a touch" (08/03/2016)   Depression    Esophageal reflux    Esophageal stricture    Family history of adverse reaction to anesthesia    brother had nausea and vomiting, mother has had nausea   Fibromyalgia    "hands, knees, elbows; back; think it's in my hips" (08/03/2016)   GAD (generalized anxiety disorder)    GERD (gastroesophageal reflux disease)    Hiatal hernia    History of blood transfusion 1970s; 01/08/2013   "low blood count"   History of kidney stones 01/08/2013   past hx.   History of nuclear stress test 01/2016   low risk study   History of stomach ulcers     "some have bleed"   Hyperlipemia    Hypertension 12/07/2012   Insomnia, unspecified    Myocardial infarction [facility]) 01/08/2013   '06-Chest pain-no stent-dx. MI-stress related   Osteoporosis    Personal history of colonic polyps 09/2010   TUBULAR ADENOMAS (X3); NEGATIVE FOR HIGH GRADE DYSPLASIA OR MALIGNANCY.   Pneumonia    "several times" (08/03/2016)   PONV (postoperative nausea and vomiting)    Stroke ([place])    "2 or 3"; remains with some right sided weaknes (08/03/2016)   Takotsubo syndrome    Tremor, essential 03/20/2017   Type II diabetes mellitus ([place])    Urinary frequency    AND INCONTINENCE    Past Surgical History:  Procedure Laterality Date   ANTERIOR CERVICAL DECOMP/DISCECTOMY FUSION N/A 01/31/2019   Procedure: ANTERIOR CERVICAL DECOMPRESSION/DISCECTOMY FUSION C5-6;  Surgeon: Melina Schools, MD;  Location: [facility];  Service: Orthopedics;  Laterality: N/A;  3 hrs   ATRIAL FIBRILLATION ABLATION  08/03/2016   CARDIAC CATHETERIZATION     normal per Dr Acie Fredrickson in note dated 02/06/12    CARDIAC CATHETERIZATION     "I've had 2 or 3" (08/03/2016)   CARPAL TUNNEL RELEASE Right    CATARACT EXTRACTION W/ INTRAOCULAR LENS  IMPLANT, BILATERAL     COLONOSCOPY W/ BIOPSIES AND POLYPECTOMY     DILATION AND CURETTAGE OF UTERUS     S/P miscarriage   ELECTROPHYSIOLOGIC STUDY N/A 08/03/2016   Procedure: Atrial Fibrillation Ablation;  Surgeon: Will Meredith Leeds, MD;  Location: [facility];  Service: Cardiovascular;  Laterality: N/A;   ESOPHAGEAL DILATION  01-08-13   2 yrs ago   HEEL SPUR SURGERY Left 2005   KNEE ARTHROSCOPY  02/28/2012   Procedure: ARTHROSCOPY KNEE;  Surgeon: Tobi Bastos, MD;  Location: [facility];  Service: Orthopedics;  Laterality: Left;   LAPAROSCOPIC CHOLECYSTECTOMY  03/2002   REVERSE SHOULDER ARTHROPLASTY Right 10/10/2019   Procedure: REVERSE SHOULDER ARTHROPLASTY;  Surgeon: Justice Britain, MD;  Location: [facility];  Service: Orthopedics;  Laterality: Right;  115mn    SHOULDER OPEN ROTATOR CUFF REPAIR Right 2011   TONSILLECTOMY AND ADENOIDECTOMY  1965   TOTAL KNEE ARTHROPLASTY Left 01/17/2013   Procedure: LEFT TOTAL KNEE ARTHROPLASTY;  Surgeon: RTobi Bastos MD;  Location: [facility];  Service: Orthopedics;  Laterality: Left;   TOTAL KNEE ARTHROPLASTY Right 06/19/2014   Procedure: RIGHT TOTAL KNEE ARTHROPLASTY;  Surgeon: RTobi Bastos MD;  Location: [facility];  Service: Orthopedics;  Laterality: Right;   TUBAL LIGATION     VAGINAL HYSTERECTOMY     "partial"   WRIST FLEXION TENDON TENOTOMIES AND PROXIMAL CORPECTOMY W/ WRIST ARTHRODESIS&ILIAC CREST BONE GRAFT       Current Outpatient Medications  Medication Sig Dispense Refill  albuterol (PROVENTIL) (2.5 MG/3ML) 0.083% nebulizer solution Take 3 mLs (2.5 mg total) by nebulization every 6 (six) hours as needed for wheezing. DX  491.9, J44.9 360 mL 12   ALPRAZolam (XANAX) 1 MG tablet Take 1 mg by mouth at bedtime.      atorvastatin (LIPITOR) 80 MG tablet Take 80 mg by mouth at bedtime.      azelastine (ASTELIN) 0.1 % nasal spray Place 1 spray into both nostrils at bedtime.      beta carotene 25000 UNIT capsule Take 25,000 Units by mouth daily.     cholecalciferol (VITAMIN D) 1000 UNITS tablet Take 1,000 Units by mouth daily.      denosumab (PROLIA) 60 MG/ML SOSY injection Inject 60 mg into the skin every 6 (six) months.     dexlansoprazole (DEXILANT) 60 MG capsule Take 60 mg by mouth daily.     docusate sodium (COLACE) 100 MG capsule Take 100 mg by mouth 2 (two) times daily.     ELIQUIS 5 MG TABS tablet TAKE 1 TABLET BY MOUTH TWICE DAILY 60 tablet 5   EPINEPHrine (EPIPEN 2-PAK) 0.3 mg/0.3 mL IJ SOAJ injection USE AS DIRECTED 2 each 12   Flaxseed, Linseed, (FLAXSEED OIL) 1200 MG CAPS Take 1,200 mg by mouth at bedtime.     furosemide (LASIX) 20 MG tablet TAKE 1 TABLET EVERY DAY 90 tablet 3   glipiZIDE (GLUCOTROL) 5 MG tablet Take 5 mg by mouth 2 (two) times daily before a meal.     isosorbide mononitrate  (IMDUR) 30 MG 24 hr tablet Take 1 tablet (30 mg total) by mouth daily. 90 tablet 3   losartan (COZAAR) 25 MG tablet Take 0.5 tablets (12.5 mg total) by mouth at bedtime. 90 tablet 3   montelukast (SINGULAIR) 10 MG tablet Take 1 tablet (10 mg total) by mouth at bedtime. 30 tablet 11   mupirocin ointment (BACTROBAN) 2 % Apply 2 application topically 2 (two) times daily as needed.     PARoxetine (PAXIL) 40 MG tablet Take 40 mg by mouth every morning.     primidone (MYSOLINE) 50 MG tablet Take 50 mg by mouth at bedtime. Take 50 mg by mouth at bedtime.     PROAIR HFA 108 (90 Base) MCG/ACT inhaler INHALE TWO PUFFS INTO THE LUNGS EVERY 6 HOURS AS NEEDED FOR WHEEZING OR SHORTNESS OF BREATH 8.5 g 11   vitamin B-12 (CYANOCOBALAMIN) 1000 MCG tablet Take 1,000 mcg by mouth every evening.      No current facility-administered medications for this visit.   Facility-Administered Medications Ordered in Other Visits  Medication Dose Route Frequency Provider Last Rate Last Admin   tranexamic acid (CYKLOKAPRON) 2,000 mg in sodium chloride 0.9 % 50 mL Topical Application  8,341 mg Topical Once [place], [employer], PA-C        Allergies:   Bee venom, Ceclor [cefaclor], Penicillins, Pneumococcal vaccines, Codeine, Cortisone, and Boniva [ibandronate sodium]    Social History:  The patient  reports that she quit smoking about 2 years ago. Her smoking use included cigarettes. She has a 25.00 pack-year smoking history. She quit smokeless tobacco use about 10 years ago.  Her smokeless tobacco use included snuff and chew. She reports current alcohol use. She reports that she does not use drugs.   Family History:  The patient's family history includes Breast cancer in her maternal aunt, maternal grandmother, and mother; Colon cancer (age of onset: 17) in her brother; Colon polyps in her brother; Diabetes in her maternal grandfather; Heart  attack in her father; Heart disease in an other family member; Heart failure in her  brother and father; Kidney disease in an other family member; Stroke in her mother; Transient ischemic attack in her mother.    ROS:  Please see the history of present illness.   Otherwise, review of systems are positive for shortness of breath, nasal drainage.   All other systems are reviewed and negative.    PHYSICAL EXAM: VS:  BP 118/64   Pulse 64   Ht 5' (1.524 m)   Wt 161 lb 12.8 oz (73.4 kg)   SpO2 95%   BMI 31.60 kg/m  , BMI Body mass index is 31.6 kg/m. GEN: Well nourished, well developed, in no acute distress HEENT: normal Neck: no JVD, carotid bruits, or masses Cardiac: RRR; no murmurs, rubs, or gallops,no edema  Respiratory:  clear to auscultation bilaterally, normal work of breathing GI: soft, nontender, nondistended, + BS MS: no deformity or atrophy Skin: warm and dry, no rash Neuro:  Strength and sensation are intact Psych: euthymic mood, full affect   EKG:   The ekg ordered today demonstrates NSR, LAD , no ST segment changes   Recent Labs: No results found for requested labs within last 365 days.   Lipid Panel    Component Value Date/Time   CHOL 127 02/25/2016 0313   CHOL 129 12/07/2012 1445   TRIG 173 (H) 02/25/2016 0313   TRIG 147 12/07/2012 1445   HDL 25 (L) 02/25/2016 0313   HDL 40 12/07/2012 1445   CHOLHDL 5.1 02/25/2016 0313   VLDL 35 02/25/2016 0313   LDLCALC 67 02/25/2016 0313   LDLCALC 60 12/07/2012 1445     Other studies Reviewed: Additional studies/ records that were reviewed today with results demonstrating: labs reviewed, A1c 7.7 HDL 39 LDL 65 triglycerides 81.   ASSESSMENT AND PLAN:  Coronary artery calcification: No angina on medical therapy. She walks quite a bit when the weather is good. No problems completing 4 METs.  No further cardiac testing needed before surgery. Atrial fibrillation: Status post ablation.  No recent AFib sx.   Anticoagulated: Tolerating Eliquis.  Will check with PharmD as to when to stop eliquis prior to  surgery. Hypertension: The current medical regimen is effective;  continue present plan and medications.    Current medicines are reviewed at length with the patient today.  The patient concerns regarding her medicines were addressed.  The following changes have been made:  No change  Labs/ tests ordered today include:   Orders Placed This Encounter  Procedures   EKG 12-Lead    Recommend 150 minutes/week of aerobic exercise Low fat, low carb, high fiber diet recommended  Disposition:   FU in 1 year   Signed, Larae Grooms, MD  06/07/2022 3:58 PM    [facility] [address], [place], [place]  [address] Phone: [phone]; Fax: [phone]

## 2022-06-07 ENCOUNTER — Encounter: Payer: Self-pay | Admitting: Interventional Cardiology

## 2022-06-07 ENCOUNTER — Ambulatory Visit: Payer: Medicare Other | Attending: Interventional Cardiology | Admitting: Interventional Cardiology

## 2022-06-07 VITALS — BP 118/64 | HR 64 | Ht 60.0 in | Wt 161.8 lb

## 2022-06-07 DIAGNOSIS — I2510 Atherosclerotic heart disease of native coronary artery without angina pectoris: Secondary | ICD-10-CM | POA: Diagnosis not present

## 2022-06-07 DIAGNOSIS — I5181 Takotsubo syndrome: Secondary | ICD-10-CM

## 2022-06-07 DIAGNOSIS — I480 Paroxysmal atrial fibrillation: Secondary | ICD-10-CM

## 2022-06-07 DIAGNOSIS — Z7901 Long term (current) use of anticoagulants: Secondary | ICD-10-CM | POA: Diagnosis not present

## 2022-06-07 DIAGNOSIS — Z01810 Encounter for preprocedural cardiovascular examination: Secondary | ICD-10-CM

## 2022-06-07 DIAGNOSIS — I2584 Coronary atherosclerosis due to calcified coronary lesion: Secondary | ICD-10-CM

## 2022-06-07 NOTE — Patient Instructions (Signed)
Medication Instructions:  Your physician recommends that you continue on your current medications as directed. Please refer to the Current Medication list given to you today.  *If you need a refill on your cardiac medications before your next appointment, please call your pharmacy*   Lab Work: none If you have labs (blood work) drawn today and your tests are completely normal, you will receive your results only by: MyChart Message (if you have MyChart) OR A paper copy in the mail If you have any lab test that is abnormal or we need to change your treatment, we will call you to review the results.   Testing/Procedures: none   Follow-Up: At [facility], you and your health needs are our priority.  As part of our continuing mission to provide you with exceptional heart care, we have created designated Provider Care Teams.  These Care Teams include your primary Cardiologist (physician) and Advanced Practice Providers (APPs -  Physician Assistants and Nurse Practitioners) who all work together to provide you with the care you need, when you need it.  We recommend signing up for the patient portal called "MyChart".  Sign up information is provided on this After Visit Summary.  MyChart is used to connect with patients for Virtual Visits (Telemedicine).  Patients are able to view lab/test results, encounter notes, upcoming appointments, etc.  Non-urgent messages can be sent to your provider as well.   To learn more about what you can do with MyChart, go to [email or web address].    Your next appointment:   12 month(s)  The format for your next appointment:   In Person  Provider:   Jayadeep Varanasi, MD     Other Instructions    Important Information About Sugar       

## 2022-06-07 NOTE — Telephone Encounter (Signed)
Per [facility] patient had BMP and CBC done in August 2023.  I called [facility] and they will fax lab results to our office

## 2022-06-07 NOTE — Telephone Encounter (Signed)
Did patient have her labs drawn today?

## 2022-06-08 NOTE — Telephone Encounter (Signed)
Patient can hold Eliquis for 3 days before procedure. Will not need bridging with Lovenox.

## 2022-06-08 NOTE — Telephone Encounter (Signed)
I have received lab results on pt from PCP.   LABS DRAWN 03/16/22  CREATININE: 0.7 eGFR 83.0 NA 139 K+ 4.0 CA+ 9.1 BUN 13 WBC 6.40 RBC 3.9 HGB 12.9 HCT 35.5 PLT 181

## 2022-06-08 NOTE — Telephone Encounter (Signed)
I called PCP office and left message if lab results specifically BMET, CBC could be faxed to our office, fax [phone] attn: pre op team. We are needing labs in order to complete pre op clearance for the pt.

## 2022-06-08 NOTE — Telephone Encounter (Signed)
Labs in [place] do not include a platelet count or a serum creatinine

## 2022-06-08 NOTE — Telephone Encounter (Signed)
Patient with diagnosis of A Fib on Eliquis for anticoagulation.     Procedure: Lt Reverse Shoulder Arthroplasty  Date of procedure: 07/14/22     CHA2DS2-VASc Score = 8  This indicates a 10.8% annual risk of stroke. The patient's score is based upon: CHF History: 1 HTN History: 1 Diabetes History: 1 Stroke History: 2 Vascular Disease History: 1 Age Score: 1 Gender Score: 1   CrCl 68 mL/min using adjusted body weight Platelet count 181K

## 2022-06-09 NOTE — Telephone Encounter (Signed)
I s/w the pt and she has been made aware she has been cleared and she can hold her Eliquis x 3 days prior to her procedure. Pt thanked me for the help. I assured the pt that we faxed over the notes giving clearance and medication recommendations.

## 2022-06-09 NOTE — Telephone Encounter (Signed)
   Primary Cardiologist: Larae Grooms, MD  Chart reviewed as part of pre-operative protocol coverage. Given past medical history and time since last visit, based on ACC/AHA guidelines, [name] [name] would be at acceptable risk for the planned procedure without further cardiovascular testing.   Patient was advised that if she develops new symptoms prior to surgery to contact our office to arrange a follow-up appointment. She verbalized understanding.  I will route this recommendation to the requesting party via Epic fax function and remove from pre-op pool.  Per office protocol, she may hold Eliquis 3 days prior to procedure.  She will not need bridging with Lovenox.  She should resume Eliquis as soon as hemodynamically stable following procedure.  Please call with questions.  [name], NP-C  06/09/2022, 7:50 AM [address] Office [phone] Fax [phone]

## 2022-06-30 NOTE — Progress Notes (Addendum)
Anesthesia Review:  PCP: DR Velna Hatchet  lab of hgba1c on chart dated 07/08/22- 7.8  Cardiologist : Irish [facility]- [place] 06/07/22  Chest x-ray : EKG : 06/07/22  Echo : 04/07/22  CT Cors- 02/05/21  Stress test: 2019  Monitor- 2019  Cardiac Cath :  Activity level: can do a flight of stairs without difficutly  Sleep Study/ CPAP : none  Fasting Blood Sugar :      / Checks Blood Sugar -- times a day:   Blood Thinner/ Instructions /Last Dose: ASA / Instructions/ Last Dose :   Eliquis - stop 2 days prior to surgery per pt  DM- type2- checks glucose 2-3 times daily per pt  Hgba1c- 07/05/22-  8.4 - routed to DR Lennette Bihari supple on 07/06/22.  PRior to preop appt on 07/05/22 pt stated she ate ice cream sandwich.  Glucose 256 at preop .   Pt reports at preop that right ear is swollen.  Has appt with PCP on 07/08/22.  Right ear swollen started on 07/02/22 per pt.

## 2022-07-04 NOTE — Patient Instructions (Signed)
SURGICAL WAITING ROOM VISITATION Patients having surgery or a procedure may have no more than 2 support people in the waiting area - these visitors may rotate.   Children under the age of 74 must have an adult with them who is not the patient. If the patient needs to stay at the hospital during part of their recovery, the visitor guidelines for inpatient rooms apply. Pre-op nurse will coordinate an appropriate time for 1 support person to accompany patient in pre-op.  This support person may not rotate.    Please refer to the [facility] website for the visitor guidelines for Inpatients (after your surgery is over and you are in a regular room).       Your procedure is scheduled on 07/14/2022     Report to [facility] Main Entrance    Report to admitting at   1000AM   Call this number if you have problems the morning of surgery [phone]   Do not eat food :After Midnight.   After Midnight you may have the following liquids until ___ 0930___ AM  DAY OF SURGERY  Water Non-Citrus Juices (without pulp, NO RED) Carbonated Beverages Black Coffee (NO MILK/CREAM OR CREAMERS, sugar ok)  Clear Tea (NO MILK/CREAM OR CREAMERS, sugar ok) regular and decaf                             Plain Jell-O (NO RED)                                           Fruit ices (not with fruit pulp, NO RED)                                     Popsicles (NO RED)                                                               Sports drinks like Gatorade (NO RED)              .        The day of surgery:  Drink ONE (1) Pre-Surgery Clear Ensure or G2 at   0930AM ( have completed by )  the morning of surgery. Drink in one sitting. Do not sip.  This drink was given to you during your hospital  pre-op appointment visit. Nothing else to drink after completing the  Pre-Surgery Clear Ensure or G2.          If you have questions, please contact your surgeon's office.     Oral Hygiene is also important to  reduce your risk of infection.                                    Remember - BRUSH YOUR TEETH THE MORNING OF SURGERY WITH YOUR REGULAR TOOTHPASTE  DENTURES WILL BE REMOVED PRIOR TO SURGERY PLEASE DO NOT APPLY "Poly grip" OR ADHESIVES!!!   Do NOT smoke after Midnight   Take these  medicines the morning of surgery with A SIP OF WATER:   nebulizer if needed, dexilant, inhalers as usual and bring, imdur, paxil   DO NOT TAKE ANY ORAL DIABETIC MEDICATIONS DAY OF YOUR SURGERY  Bring CPAP mask and tubing day of surgery.                              You may not have any metal on your body including hair pins, jewelry, and body piercing             Do not wear make-up, lotions, powders, perfumes/cologne, or deodorant  Do not wear nail polish including gel and S&S, artificial/acrylic nails, or any other type of covering on natural nails including finger and toenails. If you have artificial nails, gel coating, etc. that needs to be removed by a nail salon please have this removed prior to surgery or surgery may need to be canceled/ delayed if the surgeon/ anesthesia feels like they are unable to be safely monitored.   Do not shave  48 hours prior to surgery.               Men may shave face and neck.   Do not bring valuables to the hospital. [place].   Contacts, glasses, dentures or bridgework may not be worn into surgery.   Bring small overnight bag day of surgery.   DO NOT [place]. PHARMACY WILL DISPENSE MEDICATIONS LISTED ON YOUR MEDICATION LIST TO YOU DURING YOUR ADMISSION [place]!    Patients discharged on the day of surgery will not be allowed to drive home.  Someone NEEDS to stay with you for the first 24 hours after anesthesia.   Special Instructions: Bring a copy of your healthcare power of attorney and living will documents the day of surgery if you haven't scanned them before.               Please read over the following fact sheets you were given: IF [place] [phone]   If you received a COVID test during your pre-op visit  it is requested that you wear a mask when out in public, stay away from anyone that may not be feeling well and notify your surgeon if you develop symptoms. If you test positive for Covid or have been in contact with anyone that has tested positive in the last 10 days please notify you surgeon.    [place] - Preparing for Surgery Before surgery, you can play an important role.  Because skin is not sterile, your skin needs to be as free of germs as possible.  You can reduce the number of germs on your skin by washing with CHG (chlorahexidine gluconate) soap before surgery.  CHG is an antiseptic cleaner which kills germs and bonds with the skin to continue killing germs even after washing. Please DO NOT use if you have an allergy to CHG or antibacterial soaps.  If your skin becomes reddened/irritated stop using the CHG and inform your nurse when you arrive at Short Stay. Do not shave (including legs and underarms) for at least 48 hours prior to the first CHG shower.  You may shave your face/neck. Please follow these instructions carefully:  1.  Shower with CHG Soap the  night before surgery and the  morning of Surgery.  2.  If you choose to wash your hair, wash your hair first as usual with your  normal  shampoo.  3.  After you shampoo, rinse your hair and body thoroughly to remove the  shampoo.                           4.  Use CHG as you would any other liquid soap.  You can apply chg directly  to the skin and wash                       Gently with a scrungie or clean washcloth.  5.  Apply the CHG Soap to your body ONLY FROM THE NECK DOWN.   Do not use on face/ open                           Wound or open sores. Avoid contact with eyes, ears mouth and genitals (private parts).                       Wash  face,  Genitals (private parts) with your normal soap.             6.  Wash thoroughly, paying special attention to the area where your surgery  will be performed.  7.  Thoroughly rinse your body with warm water from the neck down.  8.  DO NOT shower/wash with your normal soap after using and rinsing off  the CHG Soap.                9.  Pat yourself dry with a clean towel.            10.  Wear clean pajamas.            11.  Place clean sheets on your bed the night of your first shower and do not  sleep with pets. Day of Surgery : Do not apply any lotions/deodorants the morning of surgery.  Please wear clean clothes to the hospital/surgery center.  FAILURE TO FOLLOW THESE INSTRUCTIONS MAY RESULT IN THE CANCELLATION OF YOUR SURGERY PATIENT SIGNATURE_________________________________  NURSE SIGNATURE__________________________________  [place]- Preparing for Total Shoulder Arthroplasty    Before surgery, you can play an important role. Because skin is not sterile, your skin needs to be as free of germs as possible. You can reduce the number of germs on your skin by using the following products. Benzoyl Peroxide Gel Reduces the number of germs present on the skin Applied twice a day to shoulder area starting two days before surgery    ==================================================================  Please follow these instructions carefully:  BENZOYL PEROXIDE 5% GEL  Please do not use if you have an allergy to benzoyl peroxide.   If your skin becomes reddened/irritated stop using the benzoyl peroxide.  Starting two days before surgery, apply as follows: Apply benzoyl peroxide in the morning and at night. Apply after taking a shower. If you are not taking a shower clean entire shoulder front, back, and side along with the armpit with a clean wet washcloth.  Place a quarter-sized dollop on your shoulder and rub in thoroughly, making sure to cover the front, back, and side of your  shoulder, along with the armpit.   2 days before ____ AM   ____ PM  1 day before ____ AM   ____ PM                         Do this twice a day for two days.  (Last application is the night before surgery, AFTER using the CHG soap as described below).  Do NOT apply benzoyl peroxide gel on the day of surgery.  ________________________________________________________________________

## 2022-07-05 ENCOUNTER — Encounter (HOSPITAL_COMMUNITY)
Admission: RE | Admit: 2022-07-05 | Discharge: 2022-07-05 | Disposition: A | Discharge status: Home / Self Care | Payer: Medicare Other | Source: Ambulatory Visit | Attending: Orthopedic Surgery | Admitting: Orthopedic Surgery

## 2022-07-05 ENCOUNTER — Encounter (HOSPITAL_COMMUNITY): Payer: Self-pay

## 2022-07-05 ENCOUNTER — Other Ambulatory Visit: Payer: Self-pay

## 2022-07-05 VITALS — BP 134/78 | HR 66 | Temp 98.3°F | Resp 16 | Ht 60.0 in | Wt 157.0 lb

## 2022-07-05 DIAGNOSIS — Z01818 Encounter for other preprocedural examination: Secondary | ICD-10-CM

## 2022-07-05 DIAGNOSIS — Z8673 Personal history of transient ischemic attack (TIA), and cerebral infarction without residual deficits: Secondary | ICD-10-CM | POA: Diagnosis not present

## 2022-07-05 DIAGNOSIS — I480 Paroxysmal atrial fibrillation: Secondary | ICD-10-CM | POA: Insufficient documentation

## 2022-07-05 DIAGNOSIS — M75102 Unspecified rotator cuff tear or rupture of left shoulder, not specified as traumatic: Secondary | ICD-10-CM | POA: Diagnosis not present

## 2022-07-05 DIAGNOSIS — Z01812 Encounter for preprocedural laboratory examination: Secondary | ICD-10-CM | POA: Insufficient documentation

## 2022-07-05 DIAGNOSIS — Z87891 Personal history of nicotine dependence: Secondary | ICD-10-CM | POA: Diagnosis not present

## 2022-07-05 DIAGNOSIS — E119 Type 2 diabetes mellitus without complications: Secondary | ICD-10-CM | POA: Diagnosis not present

## 2022-07-05 DIAGNOSIS — I110 Hypertensive heart disease with heart failure: Secondary | ICD-10-CM | POA: Diagnosis not present

## 2022-07-05 DIAGNOSIS — J449 Chronic obstructive pulmonary disease, unspecified: Secondary | ICD-10-CM | POA: Insufficient documentation

## 2022-07-05 DIAGNOSIS — I2510 Atherosclerotic heart disease of native coronary artery without angina pectoris: Secondary | ICD-10-CM | POA: Insufficient documentation

## 2022-07-05 HISTORY — DX: Dyspnea, unspecified: R06.00

## 2022-07-05 LAB — BASIC METABOLIC PANEL
Anion gap: 10 (ref 5–15)
BUN: 18 mg/dL (ref 8–23)
CO2: 23 mmol/L (ref 22–32)
Calcium: 9 mg/dL (ref 8.9–10.3)
Chloride: 104 mmol/L (ref 98–111)
Creatinine, Ser: 0.62 mg/dL (ref 0.44–1.00)
GFR, Estimated: >60 mL/min (ref >60)
Glucose, Bld: 261 mg/dL — ABNORMAL HIGH (ref 70–99)
Potassium: 3.6 mmol/L (ref 3.5–5.1)
Sodium: 137 mmol/L (ref 135–145)

## 2022-07-05 LAB — CBC
HCT: 38 % (ref 36.0–46.0)
Hemoglobin: 12.2 g/dL (ref 12.0–15.0)
MCH: 30.7 pg (ref 26.0–34.0)
MCHC: 32.1 g/dL (ref 30.0–36.0)
MCV: 95.5 fL (ref 80.0–100.0)
Platelets: 178 10*3/uL (ref 150–400)
RBC: 3.98 MIL/uL (ref 3.87–5.11)
RDW: 13.4 % (ref 11.5–15.5)
WBC: 6.4 10*3/uL (ref 4.0–10.5)
nRBC: 0 % (ref 0.0–0.2)

## 2022-07-05 LAB — GLUCOSE, CAPILLARY: Glucose-Capillary: 256 mg/dL — ABNORMAL HIGH (ref 70–99)

## 2022-07-05 LAB — SURGICAL PCR SCREEN
MRSA, PCR: NEGATIVE
Staphylococcus aureus: POSITIVE — AB

## 2022-07-06 LAB — HEMOGLOBIN A1C
Hgb A1c MFr Bld: 8.4 % — ABNORMAL HIGH (ref 4.8–5.6)
Mean Plasma Glucose: 194 mg/dL

## 2022-07-07 NOTE — Progress Notes (Signed)
Anesthesia Chart Review   Case: [id number] Date/Time: 07/14/22 1215   Procedure: REVERSE SHOULDER ARTHROPLASTY (Left: Shoulder) - 183mn   Anesthesia type: General   Pre-op diagnosis: Left shoulder rotator cuff tear arthropathy   [facility] 06 / [facility]   Surgeons: SJustice Britain MD       DISCUSSION:71 y.o. former smoker with h/o HTN, CHF, CAD, PAF, COPD, DM II, stroke, left shoulder rotator cuff tear scheduled for above procedure 07/14/2022 with Dr. KJustice Britain   Pt last seen by cardiology 06/07/2022. Per OV note, "No angina on medical therapy. She walks quite a bit when the weather is good. No problems completing 4 METs.  No further cardiac testing needed before surgery."  Pt advised to hold Eliquis 3 days prior to surgery. She reports to PAT nurse she will hold 2 days prior to surgery.  Discussed with Dr. SSusie Cassetteoffice.   A1C 8.4, discussed with Dr. SSusie Cassetteoffice.  VS: BP 134/78   Pulse 66   Temp 36.8 C (Oral)   Resp 16   Ht 5' (1.524 m)   Wt 71.2 kg   SpO2 98%   BMI 30.66 kg/m   PROVIDERS: HVelna Hatchet MD is PCP   VLarae Grooms MD is Cardiologist  LABS:  forwarded to surgeon and PCP  (all labs ordered are listed, but only abnormal results are displayed)  Labs Reviewed  SURGICAL PCR SCREEN - Abnormal; Notable for the following components:      Result Value   Staphylococcus aureus POSITIVE (*)    All other components within normal limits  BASIC METABOLIC PANEL - Abnormal; Notable for the following components:   Glucose, Bld 261 (*)    All other components within normal limits  HEMOGLOBIN A1C - Abnormal; Notable for the following components:   Hgb A1c MFr Bld 8.4 (*)    All other components within normal limits  GLUCOSE, CAPILLARY - Abnormal; Notable for the following components:   Glucose-Capillary 256 (*)    All other components within normal limits  CBC     IMAGES:   EKG:   CV: Echo 04/07/2022 1. Left ventricular ejection fraction,  by estimation, is 60 to 65%. The  left ventricle has normal function. The left ventricle has no regional  wall motion abnormalities. Left ventricular diastolic parameters were  normal.   2. Right ventricular systolic function is normal. The right ventricular  size is normal.   3. The mitral valve is normal in structure. No evidence of mitral valve  regurgitation. No evidence of mitral stenosis.   4. The aortic valve is normal in structure. Aortic valve regurgitation is  not visualized. No aortic stenosis is present.   5. The inferior vena cava is normal in size with greater than 50%  respiratory variability, suggesting right atrial pressure of 3 mmHg.   Myocardial Perfusion 05/25/2018 Nuclear stress EF: 67%. Normal perfusion This is a low risk study. Past Medical History:  Diagnosis Date   Anemia    hx of years ago    Anxiety    Aortic valve disorders    Arthritis    Asthma    Barrett esophagus    Bipolar disorder (HCC)    CHF (congestive heart failure) (HCC)    Chronic bronchitis (HCC)    COPD [place] obstructive [place] disease[place]    "just have a touch" (08/03/2016)   Depression    Dyspnea    sob with exertion   Dysrhythmia    Esophageal reflux  Esophageal stricture    GAD (generalized anxiety disorder)    GERD (gastroesophageal reflux disease)    Heart murmur    Hiatal hernia    History of blood transfusion 1970s; 01/08/2013   "low blood count"   History of nuclear stress test 01/2016   low risk study   History of stomach ulcers    "some have bleed"   Hyperlipemia    Hypertension 12/07/2012   Insomnia, unspecified    Myocardial infarction ([place]) 01/08/2013   '06-Chest pain-no stent-dx. MI-stress related   Osteoporosis    Personal history of colonic polyps 09/2010   TUBULAR ADENOMAS (X3); NEGATIVE FOR HIGH GRADE DYSPLASIA OR MALIGNANCY.   Pneumonia    "several times" (08/03/2016)   Stroke [facility])    "2 or 3"; remains with some right sided weaknes  (08/03/2016)   Takotsubo syndrome    Tremor, essential 03/20/2017   Type II diabetes mellitus ([place])    Urinary frequency    AND INCONTINENCE    Past Surgical History:  Procedure Laterality Date   ANTERIOR CERVICAL DECOMP/DISCECTOMY FUSION N/A 01/31/2019   Procedure: ANTERIOR CERVICAL DECOMPRESSION/DISCECTOMY FUSION C5-6;  Surgeon: Melina Schools, MD;  Location: [place];  Service: Orthopedics;  Laterality: N/A;  3 hrs   ATRIAL FIBRILLATION ABLATION  08/03/2016   CARDIAC CATHETERIZATION     normal per Dr Acie Fredrickson in note dated 02/06/12    CARDIAC CATHETERIZATION     "I've had 2 or 3" (08/03/2016)   CARPAL TUNNEL RELEASE Right    CATARACT EXTRACTION W/ INTRAOCULAR LENS  IMPLANT, BILATERAL     COLONOSCOPY W/ BIOPSIES AND POLYPECTOMY     DILATION AND CURETTAGE OF UTERUS     S/P miscarriage   ELECTROPHYSIOLOGIC STUDY N/A 08/03/2016   Procedure: Atrial Fibrillation Ablation;  Surgeon: Will Meredith Leeds, MD;  Location: [place] [facility];  Service: Cardiovascular;  Laterality: N/A;   ESOPHAGEAL DILATION  01-08-13   2 yrs ago   HEEL SPUR SURGERY Left 2005   KNEE ARTHROSCOPY  02/28/2012   Procedure: ARTHROSCOPY KNEE;  Surgeon: Tobi Bastos, MD;  [facility];  Service: Orthopedics;  Laterality: Left;   LAPAROSCOPIC CHOLECYSTECTOMY  03/2002   REVERSE SHOULDER ARTHROPLASTY Right 10/10/2019   Procedure: REVERSE SHOULDER ARTHROPLASTY;  Surgeon: Justice Britain, MD;  [facility];  Service: Orthopedics;  Laterality: Right;  176mn   SHOULDER OPEN ROTATOR CUFF REPAIR Right 2011   TONSILLECTOMY AND ADENOIDECTOMY  1965   TOTAL KNEE ARTHROPLASTY Left 01/17/2013   Procedure: LEFT TOTAL KNEE ARTHROPLASTY;  Surgeon: RTobi Bastos MD;  [facility];  Service: Orthopedics;  Laterality: Left;   TOTAL KNEE ARTHROPLASTY Right 06/19/2014   Procedure: RIGHT TOTAL KNEE ARTHROPLASTY;  Surgeon: RTobi Bastos MD;  [facility];  Service: Orthopedics;  Laterality: Right;   TUBAL LIGATION     VAGINAL  HYSTERECTOMY     "partial"   WRIST FLEXION TENDON TENOTOMIES AND PROXIMAL CORPECTOMY W/ WRIST ARTHRODESIS&ILIAC CREST BONE GRAFT      MEDICATIONS:  acetaminophen (TYLENOL) 500 MG tablet   albuterol (PROVENTIL) (2.5 MG/3ML) 0.083% nebulizer solution   ALPRAZolam (XANAX) 1 MG tablet   atorvastatin (LIPITOR) 80 MG tablet   azelastine (ASTELIN) 0.1 % nasal spray   beta carotene 25000 UNIT capsule   cholecalciferol (VITAMIN D) 1000 UNITS tablet   cyclobenzaprine (FLEXERIL) 10 MG tablet   denosumab (PROLIA) 60 MG/ML SOSY injection   dexlansoprazole (DEXILANT) 60 MG capsule   docusate sodium (COLACE) 250 MG capsule  ELIQUIS 5 MG TABS tablet   EPINEPHrine (EPIPEN 2-PAK) 0.3 mg/0.3 mL IJ SOAJ injection   Flaxseed, Linseed, (FLAXSEED OIL) 1200 MG CAPS   furosemide (LASIX) 20 MG tablet   glipiZIDE (GLUCOTROL) 5 MG tablet   isosorbide mononitrate (IMDUR) 30 MG 24 hr tablet   losartan (COZAAR) 25 MG tablet   montelukast (SINGULAIR) 10 MG tablet   Multiple Vitamin (MULTIVITAMIN WITH MINERALS) TABS tablet   mupirocin ointment (BACTROBAN) 2 %   PARoxetine (PAXIL) 40 MG tablet   primidone (MYSOLINE) 50 MG tablet   PROAIR HFA 108 (90 Base) MCG/ACT inhaler   vitamin B-12 (CYANOCOBALAMIN) 1000 MCG tablet   No current facility-administered medications for this encounter.    tranexamic acid (CYKLOKAPRON) 2,000 mg in sodium chloride 0.9 % 50 mL Topical Application     [place] Kuang Ward, PA-C [facility]Surgical [facility] [phone]

## 2022-07-14 ENCOUNTER — Ambulatory Visit (HOSPITAL_COMMUNITY)
Admission: RE | Admit: 2022-07-14 | Discharge: 2022-07-14 | Disposition: A | Discharge status: Home / Self Care | Payer: Medicare Other | Source: Ambulatory Visit | Attending: Orthopedic Surgery | Admitting: Orthopedic Surgery

## 2022-07-14 ENCOUNTER — Ambulatory Visit (HOSPITAL_COMMUNITY): Payer: Medicare Other | Admitting: Physician Assistant

## 2022-07-14 ENCOUNTER — Ambulatory Visit (HOSPITAL_BASED_OUTPATIENT_CLINIC_OR_DEPARTMENT_OTHER): Payer: Medicare Other | Admitting: Anesthesiology

## 2022-07-14 ENCOUNTER — Encounter (HOSPITAL_COMMUNITY): Payer: Self-pay | Admitting: Orthopedic Surgery

## 2022-07-14 ENCOUNTER — Other Ambulatory Visit: Payer: Self-pay

## 2022-07-14 ENCOUNTER — Encounter (HOSPITAL_COMMUNITY)
Admission: RE | Disposition: A | Discharge status: Home / Self Care | Payer: Self-pay | Source: Ambulatory Visit | Attending: Orthopedic Surgery

## 2022-07-14 DIAGNOSIS — K449 Diaphragmatic hernia without obstruction or gangrene: Secondary | ICD-10-CM | POA: Insufficient documentation

## 2022-07-14 DIAGNOSIS — J449 Chronic obstructive pulmonary disease, unspecified: Secondary | ICD-10-CM | POA: Diagnosis not present

## 2022-07-14 DIAGNOSIS — I509 Heart failure, unspecified: Secondary | ICD-10-CM | POA: Diagnosis not present

## 2022-07-14 DIAGNOSIS — E119 Type 2 diabetes mellitus without complications: Secondary | ICD-10-CM | POA: Diagnosis not present

## 2022-07-14 DIAGNOSIS — M12812 Other specific arthropathies, not elsewhere classified, left shoulder: Secondary | ICD-10-CM

## 2022-07-14 DIAGNOSIS — I2510 Atherosclerotic heart disease of native coronary artery without angina pectoris: Secondary | ICD-10-CM | POA: Diagnosis not present

## 2022-07-14 DIAGNOSIS — Z7984 Long term (current) use of oral hypoglycemic drugs: Secondary | ICD-10-CM | POA: Diagnosis not present

## 2022-07-14 DIAGNOSIS — M75102 Unspecified rotator cuff tear or rupture of left shoulder, not specified as traumatic: Secondary | ICD-10-CM | POA: Diagnosis not present

## 2022-07-14 DIAGNOSIS — I110 Hypertensive heart disease with heart failure: Secondary | ICD-10-CM | POA: Diagnosis not present

## 2022-07-14 DIAGNOSIS — Z87891 Personal history of nicotine dependence: Secondary | ICD-10-CM | POA: Diagnosis not present

## 2022-07-14 DIAGNOSIS — E118 Type 2 diabetes mellitus with unspecified complications: Secondary | ICD-10-CM | POA: Insufficient documentation

## 2022-07-14 DIAGNOSIS — K279 Peptic ulcer, site unspecified, unspecified as acute or chronic, without hemorrhage or perforation: Secondary | ICD-10-CM | POA: Diagnosis not present

## 2022-07-14 DIAGNOSIS — F411 Generalized anxiety disorder: Secondary | ICD-10-CM | POA: Diagnosis not present

## 2022-07-14 DIAGNOSIS — M19012 Primary osteoarthritis, left shoulder: Secondary | ICD-10-CM | POA: Diagnosis present

## 2022-07-14 DIAGNOSIS — K219 Gastro-esophageal reflux disease without esophagitis: Secondary | ICD-10-CM | POA: Insufficient documentation

## 2022-07-14 DIAGNOSIS — F319 Bipolar disorder, unspecified: Secondary | ICD-10-CM | POA: Insufficient documentation

## 2022-07-14 DIAGNOSIS — Z01818 Encounter for other preprocedural examination: Secondary | ICD-10-CM

## 2022-07-14 DIAGNOSIS — I4891 Unspecified atrial fibrillation: Secondary | ICD-10-CM | POA: Diagnosis not present

## 2022-07-14 HISTORY — PX: REVERSE SHOULDER ARTHROPLASTY: SHX5054

## 2022-07-14 LAB — GLUCOSE, CAPILLARY: Glucose-Capillary: 237 mg/dL — ABNORMAL HIGH (ref 70–99)

## 2022-07-14 SURGERY — ARTHROPLASTY, SHOULDER, TOTAL, REVERSE
Anesthesia: General | Site: Shoulder | Laterality: Left

## 2022-07-14 MED ORDER — OXYCODONE-ACETAMINOPHEN 5-325 MG PO TABS: 1.0000 | ORAL_TABLET | ORAL | 0 refills | Status: DC | PRN

## 2022-07-14 MED ORDER — PHENYLEPHRINE HCL-NACL 20-0.9 MG/250ML-% IV SOLN
INTRAVENOUS | Status: AC
  Filled 2022-07-14: qty 250

## 2022-07-14 MED ORDER — PROPOFOL 10 MG/ML IV BOLUS
INTRAVENOUS | Status: AC
  Filled 2022-07-14: qty 20

## 2022-07-14 MED ORDER — OXYCODONE HCL 5 MG PO TABS
5.0000 mg | ORAL_TABLET | Freq: Once | ORAL | Status: AC | PRN
  Administered 2022-07-14: 5 mg via ORAL

## 2022-07-14 MED ORDER — LIDOCAINE HCL (PF) 2 % IJ SOLN
INTRAMUSCULAR | Status: AC
  Filled 2022-07-14: qty 5

## 2022-07-14 MED ORDER — PHENYLEPHRINE 80 MCG/ML (10ML) SYRINGE FOR IV PUSH (FOR BLOOD PRESSURE SUPPORT)
PREFILLED_SYRINGE | INTRAVENOUS | Status: DC | PRN
  Administered 2022-07-14: 80 ug via INTRAVENOUS

## 2022-07-14 MED ORDER — FENTANYL CITRATE PF 50 MCG/ML IJ SOSY
25.0000 ug | PREFILLED_SYRINGE | INTRAMUSCULAR | Status: DC | PRN
  Administered 2022-07-14 (×2): 50 ug via INTRAVENOUS

## 2022-07-14 MED ORDER — CYCLOBENZAPRINE HCL 10 MG PO TABS: 10.0000 mg | ORAL_TABLET | Freq: Every evening | ORAL | 1 refills | Status: AC | PRN

## 2022-07-14 MED ORDER — SUGAMMADEX SODIUM 200 MG/2ML IV SOLN
INTRAVENOUS | Status: DC | PRN
  Administered 2022-07-14: 150 mg via INTRAVENOUS

## 2022-07-14 MED ORDER — PHENYLEPHRINE HCL-NACL 20-0.9 MG/250ML-% IV SOLN
INTRAVENOUS | Status: DC | PRN
  Administered 2022-07-14: 25 ug/min via INTRAVENOUS

## 2022-07-14 MED ORDER — TRANEXAMIC ACID-NACL 1000-0.7 MG/100ML-% IV SOLN
1000.0000 mg | INTRAVENOUS | Status: AC
  Administered 2022-07-14: 1000 mg via INTRAVENOUS
  Filled 2022-07-14: qty 100

## 2022-07-14 MED ORDER — ONDANSETRON HCL 4 MG/2ML IJ SOLN: 4.0000 mg | Freq: Once | INTRAMUSCULAR | Status: DC | PRN

## 2022-07-14 MED ORDER — 0.9 % SODIUM CHLORIDE (POUR BTL) OPTIME
TOPICAL | Status: DC | PRN
  Administered 2022-07-14: 1000 mL

## 2022-07-14 MED ORDER — VANCOMYCIN HCL 1000 MG IV SOLR
INTRAVENOUS | Status: AC
  Filled 2022-07-14: qty 20

## 2022-07-14 MED ORDER — BUPIVACAINE LIPOSOME 1.3 % IJ SUSP
INTRAMUSCULAR | Status: DC | PRN
  Administered 2022-07-14: 10 mL via PERINEURAL

## 2022-07-14 MED ORDER — ROCURONIUM BROMIDE 10 MG/ML (PF) SYRINGE
PREFILLED_SYRINGE | INTRAVENOUS | Status: DC | PRN
  Administered 2022-07-14: 60 mg via INTRAVENOUS

## 2022-07-14 MED ORDER — BUPIVACAINE HCL (PF) 0.5 % IJ SOLN
INTRAMUSCULAR | Status: DC | PRN
  Administered 2022-07-14: 15 mL via PERINEURAL

## 2022-07-14 MED ORDER — ROCURONIUM BROMIDE 10 MG/ML (PF) SYRINGE
PREFILLED_SYRINGE | INTRAVENOUS | Status: AC
  Filled 2022-07-14: qty 10

## 2022-07-14 MED ORDER — DEXAMETHASONE SODIUM PHOSPHATE 10 MG/ML IJ SOLN
INTRAMUSCULAR | Status: DC | PRN
  Administered 2022-07-14: 5 mg via INTRAVENOUS

## 2022-07-14 MED ORDER — LACTATED RINGERS IV BOLUS
250.0000 mL | Freq: Once | INTRAVENOUS | Status: AC
  Administered 2022-07-14: 250 mL via INTRAVENOUS

## 2022-07-14 MED ORDER — ONDANSETRON HCL 4 MG/2ML IJ SOLN
INTRAMUSCULAR | Status: DC | PRN
  Administered 2022-07-14: 4 mg via INTRAVENOUS

## 2022-07-14 MED ORDER — MIDAZOLAM HCL 2 MG/2ML IJ SOLN
1.0000 mg | INTRAMUSCULAR | Status: DC
  Administered 2022-07-14: 1 mg via INTRAVENOUS
  Filled 2022-07-14: qty 2

## 2022-07-14 MED ORDER — CHLORHEXIDINE GLUCONATE 0.12 % MT SOLN
15.0000 mL | Freq: Once | OROMUCOSAL | Status: AC
  Administered 2022-07-14: 15 mL via OROMUCOSAL

## 2022-07-14 MED ORDER — PROPOFOL 10 MG/ML IV BOLUS
INTRAVENOUS | Status: DC | PRN
  Administered 2022-07-14: 120 mg via INTRAVENOUS
  Administered 2022-07-14: 20 mg via INTRAVENOUS

## 2022-07-14 MED ORDER — VANCOMYCIN HCL IN DEXTROSE 1-5 GM/200ML-% IV SOLN
1000.0000 mg | INTRAVENOUS | Status: AC
  Administered 2022-07-14: 1000 mg via INTRAVENOUS
  Filled 2022-07-14: qty 200

## 2022-07-14 MED ORDER — LACTATED RINGERS IV SOLN: INTRAVENOUS | Status: DC

## 2022-07-14 MED ORDER — FENTANYL CITRATE PF 50 MCG/ML IJ SOSY
PREFILLED_SYRINGE | INTRAMUSCULAR | Status: AC
  Filled 2022-07-14: qty 2

## 2022-07-14 MED ORDER — AMISULPRIDE (ANTIEMETIC) 5 MG/2ML IV SOLN: 10.0000 mg | Freq: Once | INTRAVENOUS | Status: DC | PRN

## 2022-07-14 MED ORDER — FENTANYL CITRATE PF 50 MCG/ML IJ SOSY
50.0000 ug | PREFILLED_SYRINGE | INTRAMUSCULAR | Status: DC
  Administered 2022-07-14: 50 ug via INTRAVENOUS
  Filled 2022-07-14: qty 2

## 2022-07-14 MED ORDER — ACETAMINOPHEN 500 MG PO TABS
1000.0000 mg | ORAL_TABLET | Freq: Once | ORAL | Status: AC
  Administered 2022-07-14: 1000 mg via ORAL
  Filled 2022-07-14: qty 2

## 2022-07-14 MED ORDER — OXYCODONE HCL 5 MG PO TABS
ORAL_TABLET | ORAL | Status: AC
  Filled 2022-07-14: qty 1

## 2022-07-14 MED ORDER — ORAL CARE MOUTH RINSE: 15.0000 mL | Freq: Once | OROMUCOSAL | Status: AC

## 2022-07-14 MED ORDER — ONDANSETRON HCL 4 MG PO TABS: 4.0000 mg | ORAL_TABLET | Freq: Three times a day (TID) | ORAL | 0 refills | Status: DC | PRN

## 2022-07-14 MED ORDER — LACTATED RINGERS IV BOLUS
500.0000 mL | Freq: Once | INTRAVENOUS | Status: AC
  Administered 2022-07-14: 500 mL via INTRAVENOUS

## 2022-07-14 MED ORDER — LIDOCAINE HCL (CARDIAC) PF 100 MG/5ML IV SOSY
PREFILLED_SYRINGE | INTRAVENOUS | Status: DC | PRN
  Administered 2022-07-14: 40 mg via INTRAVENOUS

## 2022-07-14 MED ORDER — OXYCODONE HCL 5 MG/5ML PO SOLN: 5.0000 mg | Freq: Once | ORAL | Status: AC | PRN

## 2022-07-14 MED ORDER — STERILE WATER FOR IRRIGATION IR SOLN
Status: DC | PRN
  Administered 2022-07-14: 2000 mL

## 2022-07-14 SURGICAL SUPPLY — 69 items
ADH SKN CLS APL DERMABOND .7 (GAUZE/BANDAGES/DRESSINGS) ×1
AID PSTN UNV HD RSTRNT DISP (MISCELLANEOUS) ×1
BAG COUNTER SPONGE SURGICOUNT (BAG) IMPLANT
BAG SPEC THK2 15X12 ZIP CLS (MISCELLANEOUS) ×1
BAG SPNG CNTER NS LX DISP (BAG)
BAG ZIPLOCK 12X15 (MISCELLANEOUS) ×1 IMPLANT
BLADE SAW SGTL 83.5X18.5 (BLADE) ×1 IMPLANT
BNDG CMPR 5X4 CHSV STRCH STRL (GAUZE/BANDAGES/DRESSINGS) ×1
BNDG COHESIVE 4X5 TAN STRL LF (GAUZE/BANDAGES/DRESSINGS) ×1 IMPLANT
BSPLAT GLND +2X24 MDLR (Joint) ×1 IMPLANT
COOLER ICEMAN CLASSIC (MISCELLANEOUS) ×1 IMPLANT
COVER BACK TABLE 60X90IN (DRAPES) ×1 IMPLANT
COVER SURGICAL LIGHT HANDLE (MISCELLANEOUS) ×1 IMPLANT
CUP SUT UNIV REVERS 36 NEUTRAL (Cup) IMPLANT
DERMABOND ADVANCED .7 DNX12 (GAUZE/BANDAGES/DRESSINGS) ×1 IMPLANT
DRAPE ORTHO SPLIT 77X108 STRL (DRAPES) ×2
DRAPE SHEET LG 3/4 BI-LAMINATE (DRAPES) ×1 IMPLANT
DRAPE SURG 17X11 SM STRL (DRAPES) ×1 IMPLANT
DRAPE SURG ORHT 6 SPLT 77X108 (DRAPES) ×2 IMPLANT
DRAPE TOP 10253 STERILE (DRAPES) ×1 IMPLANT
DRAPE U-SHAPE 47X51 STRL (DRAPES) ×1 IMPLANT
DRESSING AQUACEL AG SP 3.5X6 (GAUZE/BANDAGES/DRESSINGS) ×1 IMPLANT
DRSG AQUACEL AG ADV 3.5X 6 (GAUZE/BANDAGES/DRESSINGS) IMPLANT
DRSG AQUACEL AG ADV 3.5X10 (GAUZE/BANDAGES/DRESSINGS) IMPLANT
DRSG AQUACEL AG SP 3.5X6 (GAUZE/BANDAGES/DRESSINGS) ×1
DURAPREP 26ML APPLICATOR (WOUND CARE) ×1 IMPLANT
ELECT BLADE TIP CTD 4 INCH (ELECTRODE) ×1 IMPLANT
ELECT PENCIL ROCKER SW 15FT (MISCELLANEOUS) ×1 IMPLANT
ELECT REM PT RETURN 15FT ADLT (MISCELLANEOUS) ×1 IMPLANT
FACESHIELD WRAPAROUND (MASK) ×5 IMPLANT
FACESHIELD WRAPAROUND OR TEAM (MASK) ×5 IMPLANT
FIBERTAPE CERCLAGE TLINK SUT (SUTURE) IMPLANT
GLENOID UNI REV MOD 24 +2 LAT (Joint) IMPLANT
GLENOSPHERE 36 +4 LAT/24 (Joint) IMPLANT
GLOVE BIO SURGEON STRL SZ7.5 (GLOVE) ×1 IMPLANT
GLOVE BIO SURGEON STRL SZ8 (GLOVE) ×1 IMPLANT
GLOVE SS BIOGEL STRL SZ 7 (GLOVE) ×1 IMPLANT
GLOVE SS BIOGEL STRL SZ 7.5 (GLOVE) ×1 IMPLANT
GOWN STRL SURGICAL XL XLNG (GOWN DISPOSABLE) ×2 IMPLANT
KIT BASIN OR (CUSTOM PROCEDURE TRAY) ×1 IMPLANT
KIT TURNOVER KIT A (KITS) IMPLANT
LINER HUMERAL 36 +3MM SM (Shoulder) IMPLANT
MANIFOLD NEPTUNE II (INSTRUMENTS) ×1 IMPLANT
NDL TAPERED W/ NITINOL LOOP (MISCELLANEOUS) ×1 IMPLANT
NEEDLE TAPERED W/ NITINOL LOOP (MISCELLANEOUS) ×1 IMPLANT
NS IRRIG 1000ML POUR BTL (IV SOLUTION) ×1 IMPLANT
PACK SHOULDER (CUSTOM PROCEDURE TRAY) ×1 IMPLANT
PAD ARMBOARD 7.5X6 YLW CONV (MISCELLANEOUS) ×1 IMPLANT
PAD COLD SHLDR WRAP-ON (PAD) ×1 IMPLANT
PIN NITINOL TARGETER 2.8 (PIN) IMPLANT
PIN SET MODULAR GLENOID SYSTEM (PIN) IMPLANT
RESTRAINT HEAD UNIVERSAL NS (MISCELLANEOUS) ×1 IMPLANT
SCREW CENTRAL MODULAR 25 (Screw) IMPLANT
SCREW PERI LOCK 5.5X16 (Screw) IMPLANT
SCREW PERI LOCK 5.5X32 (Screw) IMPLANT
SCREW PERIPHERAL 5.5X20 LOCK (Screw) IMPLANT
SLING ARM FOAM STRAP LRG (SOFTGOODS) IMPLANT
SLING ARM FOAM STRAP MED (SOFTGOODS) IMPLANT
STEM HUMERAL MOD SZ 5 135 DEG (Stem) IMPLANT
SUT MNCRL AB 3-0 PS2 18 (SUTURE) ×1 IMPLANT
SUT MON AB 2-0 CT1 36 (SUTURE) ×1 IMPLANT
SUT VIC AB 1 CT1 36 (SUTURE) ×1 IMPLANT
SUTURE TAPE 1.3 40 TPR END (SUTURE) ×2 IMPLANT
SUTURETAPE 1.3 40 TPR END (SUTURE) ×2
TOWEL OR 17X26 10 PK STRL BLUE (TOWEL DISPOSABLE) ×1 IMPLANT
TOWEL OR NON WOVEN STRL DISP B (DISPOSABLE) ×1 IMPLANT
TUBE SUCTION HIGH CAP CLEAR NV (SUCTIONS) ×1 IMPLANT
TUBING CONNECTING 10 (TUBING) IMPLANT
WATER STERILE IRR 1000ML POUR (IV SOLUTION) ×2 IMPLANT

## 2022-07-14 NOTE — H&P (Signed)
[name] [name]    Chief Complaint: Left shoulder rotator cuff tear arthropathy HPI: The patient is a 71 y.o. female well-known to our practice after a previous right shoulder reverse arthroplasty that she has done very well with.  She presents with chronic and progressively increasing left shoulder pain related to severe osteoarthritis and significant rotator cuff dysfunction.  Due to her increasing function limitations and failure to respond to prolonged attempts at conservative management, she is brought to the operating room at this time for planned left shoulder reverse arthroplasty.  Past Medical History:  Diagnosis Date   Anemia    hx of years ago    Anxiety    Aortic valve disorders    Arthritis    Asthma    Barrett esophagus    Bipolar disorder (HCC)    CHF (congestive heart failure) (HCC)    Chronic bronchitis (HCC)    COPD (chronic obstructive pulmonary disease) ([place])    "just have a touch" (08/03/2016)   Depression    Dyspnea    sob with exertion   Dysrhythmia    Esophageal reflux    Esophageal stricture    GAD (generalized anxiety disorder)    GERD (gastroesophageal reflux disease)    Heart murmur    Hiatal hernia    History of blood transfusion 1970s; 01/08/2013   "low blood count"   History of nuclear stress test 01/2016   low risk study   History of stomach ulcers    "some have bleed"   Hyperlipemia    Hypertension 12/07/2012   Insomnia, unspecified    Myocardial infarction ([place]) 01/08/2013   '06-Chest pain-no stent-dx. MI-stress related   Osteoporosis    Personal history of colonic polyps 09/2010   TUBULAR ADENOMAS (X3); NEGATIVE FOR HIGH GRADE DYSPLASIA OR MALIGNANCY.   Pneumonia    "several times" (08/03/2016)   Stroke [facility])    "2 or 3"; remains with some right sided weaknes (08/03/2016)   Takotsubo syndrome    Tremor, essential 03/20/2017   Type II diabetes mellitus ([place])    Urinary frequency    AND INCONTINENCE      Past Surgical History:   Procedure Laterality Date   ANTERIOR CERVICAL DECOMP/DISCECTOMY FUSION N/A 01/31/2019   Procedure: ANTERIOR CERVICAL DECOMPRESSION/DISCECTOMY FUSION C5-6;  Surgeon: Melina Schools, MD;  Location: [place];  Service: Orthopedics;  Laterality: N/A;  3 hrs   ATRIAL FIBRILLATION ABLATION  08/03/2016   CARDIAC CATHETERIZATION     normal per Dr Acie Fredrickson in note dated 02/06/12    CARDIAC CATHETERIZATION     "I've had 2 or 3" (08/03/2016)   CARPAL TUNNEL RELEASE Right    CATARACT EXTRACTION W/ INTRAOCULAR LENS  IMPLANT, BILATERAL     COLONOSCOPY W/ BIOPSIES AND POLYPECTOMY     DILATION AND CURETTAGE OF UTERUS     S/P miscarriage   ELECTROPHYSIOLOGIC STUDY N/A 08/03/2016   Procedure: Atrial Fibrillation Ablation;  Surgeon: Will Meredith Leeds, MD;  Location: [place] [facility];  Service: Cardiovascular;  Laterality: N/A;   ESOPHAGEAL DILATION  01-08-13   2 yrs ago   HEEL SPUR SURGERY Left 2005   KNEE ARTHROSCOPY  02/28/2012   Procedure: ARTHROSCOPY KNEE;  Surgeon: Tobi Bastos, MD;  [facility];  Service: Orthopedics;  Laterality: Left;   LAPAROSCOPIC CHOLECYSTECTOMY  03/2002   REVERSE SHOULDER ARTHROPLASTY Right 10/10/2019   Procedure: REVERSE SHOULDER ARTHROPLASTY;  Surgeon: Justice Britain, MD;  [facility];  Service: Orthopedics;  Laterality: Right;  170mn  SHOULDER OPEN ROTATOR CUFF REPAIR Right 2011   TONSILLECTOMY AND ADENOIDECTOMY  1965   TOTAL KNEE ARTHROPLASTY Left 01/17/2013   Procedure: LEFT TOTAL KNEE ARTHROPLASTY;  Surgeon: Tobi Bastos, MD;  [facility];  Service: Orthopedics;  Laterality: Left;   TOTAL KNEE ARTHROPLASTY Right 06/19/2014   Procedure: RIGHT TOTAL KNEE ARTHROPLASTY;  Surgeon: Tobi Bastos, MD;  [facility];  Service: Orthopedics;  Laterality: Right;   TUBAL LIGATION     VAGINAL HYSTERECTOMY     "partial"   WRIST FLEXION TENDON TENOTOMIES AND PROXIMAL CORPECTOMY W/ WRIST ARTHRODESIS&ILIAC CREST BONE GRAFT      Family History  Problem Relation  Age of Onset   Breast cancer Mother    Stroke Mother    Transient ischemic attack Mother    Colon cancer Brother 22   Colon polyps Brother    Heart failure Brother    Heart attack Father    Heart failure Father    Diabetes Maternal Grandfather    Kidney disease Other        Both sides of family   Heart disease Other        Both sides of family   Breast cancer Maternal Aunt    Breast cancer Maternal Grandmother     Social History:  reports that she quit smoking about 2 years ago. Her smoking use included cigarettes. She has a 25.00 pack-year smoking history. She quit smokeless tobacco use about 10 years ago.  Her smokeless tobacco use included snuff and chew. She reports current alcohol use. She reports that she does not use drugs.  BMI: Estimated body mass index is 30.66 kg/m as calculated from the following:   Height as of this encounter: 5' (1.524 m).   Weight as of this encounter: 71.2 kg.  Lab Results  Component Value Date   ALBUMIN 4.0 03/25/2018   Diabetes:   Patient has a diagnosis of diabetes,  Lab Results  Component Value Date   HGBA1C 8.4 (H) 07/05/2022   Smoking Status:       Medications Prior to Admission  Medication Sig Dispense Refill   acetaminophen (TYLENOL) 500 MG tablet Take 500-1,000 mg by mouth every 6 (six) hours as needed (pain).     albuterol (PROVENTIL) (2.5 MG/3ML) 0.083% nebulizer solution Take 3 mLs (2.5 mg total) by nebulization every 6 (six) hours as needed for wheezing. DX  491.9, J44.9 360 mL 12   ALPRAZolam (XANAX) 1 MG tablet Take 1 mg by mouth in the morning, at noon, and at bedtime.     atorvastatin (LIPITOR) 80 MG tablet Take 80 mg by mouth at bedtime.      azelastine (ASTELIN) 0.1 % nasal spray Place 1 spray into both nostrils at bedtime as needed for allergies.     beta carotene 25000 UNIT capsule Take 25,000 Units by mouth 3 (three) times a week. At night     cholecalciferol (VITAMIN D) 1000 UNITS tablet Take 1,000 Units by mouth  every evening.     cyclobenzaprine (FLEXERIL) 10 MG tablet Take 10 mg by mouth at bedtime as needed (pain).     dexlansoprazole (DEXILANT) 60 MG capsule Take 60 mg by mouth daily before breakfast.     docusate sodium (COLACE) 250 MG capsule Take 250 mg by mouth in the morning, at noon, and at bedtime.     ELIQUIS 5 MG TABS tablet TAKE 1 TABLET BY MOUTH TWICE DAILY 60 tablet 5   Flaxseed, Linseed, (FLAXSEED OIL) 1200  MG CAPS Take 1,200 mg by mouth at bedtime.     furosemide (LASIX) 20 MG tablet TAKE 1 TABLET EVERY DAY 90 tablet 3   glipiZIDE (GLUCOTROL) 5 MG tablet Take 5 mg by mouth 2 (two) times daily before a meal.     isosorbide mononitrate (IMDUR) 30 MG 24 hr tablet Take 1 tablet (30 mg total) by mouth daily. (Patient taking differently: Take 30 mg by mouth every evening.) 90 tablet 3   losartan (COZAAR) 25 MG tablet Take 0.5 tablets (12.5 mg total) by mouth at bedtime. 90 tablet 3   montelukast (SINGULAIR) 10 MG tablet Take 10 mg by mouth in the morning.     Multiple Vitamin (MULTIVITAMIN WITH MINERALS) TABS tablet Take 1 tablet by mouth every evening. Centrum Silver     mupirocin ointment (BACTROBAN) 2 % Apply 2 application  topically 2 (two) times daily as needed (wounds).     PARoxetine (PAXIL) 40 MG tablet Take 40 mg by mouth every morning.     primidone (MYSOLINE) 50 MG tablet Take 50 mg by mouth at bedtime.     PROAIR HFA 108 (90 Base) MCG/ACT inhaler INHALE TWO PUFFS INTO THE LUNGS EVERY 6 HOURS AS NEEDED FOR WHEEZING OR SHORTNESS OF BREATH 8.5 g 11   vitamin B-12 (CYANOCOBALAMIN) 1000 MCG tablet Take 1,000 mcg by mouth every evening.      denosumab (PROLIA) 60 MG/ML SOSY injection Inject 60 mg into the skin every 6 (six) months.     EPINEPHrine (EPIPEN 2-PAK) 0.3 mg/0.3 mL IJ SOAJ injection USE AS DIRECTED 2 each 12     Physical Exam: Left shoulder demonstrates painful and guarded motion is noted at her recent office visits.  She has globally decreased strength secondary to pain  and rotator cuff dysfunction.  She is otherwise neurovascularly intact in the left upper extremity.  Imaging studies confirm severe osteoarthritis as well as significant rotator cuff dysfunction.  Vitals  Temp:  [97.7 F (36.5 C)] 97.7 F (36.5 C) (12/14 1010) Pulse Rate:  [66] 66 (12/14 1010) Resp:  [11] 11 (12/14 1010) BP: (115)/(60) 115/60 (12/14 1010) SpO2:  [95 %] 95 % (12/14 1010) Weight:  [71.2 kg] 71.2 kg (12/14 1014)  Assessment/Plan  Impression: Left shoulder rotator cuff tear arthropathy  Plan of Action: Procedure(s): REVERSE SHOULDER ARTHROPLASTY  Jayshun Galentine M Yasseen Salls 07/14/2022, 11:18 AM Contact # [phone]

## 2022-07-14 NOTE — Transfer of Care (Signed)
Immediate Anesthesia Transfer of Care Note  Patient: [name] [name]  Procedure(s) Performed: REVERSE SHOULDER ARTHROPLASTY (Left: Shoulder)  Patient Location: PACU  Anesthesia Type:GA combined with regional for post-op pain  Level of Consciousness: awake, alert , oriented, drowsy, and patient cooperative  Airway & Oxygen Therapy: Patient Spontanous Breathing and Patient connected to face mask oxygen  Post-op Assessment: Report given to RN and Post -op Vital signs reviewed and stable  Post vital signs: Reviewed and stable  Last Vitals:  Vitals Value Taken Time  BP 143/76 07/14/22 1355  Temp 36.5 C 07/14/22 1356  Pulse 77 07/14/22 1357  Resp 16 07/14/22 1357  SpO2 100 % 07/14/22 1357  Vitals shown include unvalidated device data.  Last Pain:  Vitals:   07/14/22 1155  TempSrc:   PainSc: 0-No pain         Complications: No notable events documented.

## 2022-07-14 NOTE — Anesthesia Procedure Notes (Signed)
Anesthesia Regional Block: Interscalene brachial plexus block   Pre-Anesthetic Checklist: , timeout performed,  Correct Patient, Correct Site, Correct Laterality,  Correct Procedure, Correct Position, site marked,  Risks and benefits discussed,  Surgical consent,  Pre-op evaluation,  At surgeon's request and post-op pain management  Laterality: Left  Prep: chloraprep       Needles:  Injection technique: Single-shot  Needle Type: Echogenic Stimulator Needle     Needle Length: 10cm  Needle Gauge: 20     Additional Needles:   Procedures:,,,, ultrasound used (permanent image in chart),,    Narrative:  Start time: 07/14/2022 11:45 AM End time: 07/14/2022 11:49 AM Injection made incrementally with aspirations every 5 mL.  Performed by: Personally  Anesthesiologist: Lidia Collum, MD  Additional Notes: Standard monitors applied. Skin prepped. Good needle visualization with ultrasound. Injection made in 5cc increments with no resistance to injection. Patient tolerated the procedure well.

## 2022-07-14 NOTE — Anesthesia Preprocedure Evaluation (Addendum)
Anesthesia Evaluation  Patient identified by MRN, date of birth, ID band Patient awake    Reviewed: Allergy & Precautions, NPO status , Patient's Chart, lab work & pertinent test results  History of Anesthesia Complications Negative for: history of anesthetic complications  Airway Mallampati: II  TM Distance: >3 FB Neck ROM: Full    Dental  (+) Edentulous Upper, Edentulous Lower, Dental Advisory Given   Pulmonary COPD, former smoker   Pulmonary exam normal        Cardiovascular Exercise Tolerance: Good hypertension, + CAD (07/22 Coronary CTA mild nonobstructive CAD) and +CHF  Normal cardiovascular exam+ dysrhythmias Atrial Fibrillation   H/o Takotsubo cardiomyopathy- recovered from low of 15-20%  Echo 04/07/22: EF 60-65%, valves unremarkable  Stress test 05/25/18: E 67%, normal perfusion, low risk   Neuro/Psych   Anxiety Depression Bipolar Disorder   CVA, Residual Symptoms    GI/Hepatic Neg liver ROS, hiatal hernia, PUD,GERD  ,,  Endo/Other  diabetes, Type 2    Renal/GU negative Renal ROS  negative genitourinary   Musculoskeletal negative musculoskeletal ROS (+)    Abdominal   Peds  Hematology negative hematology ROS (+)   Anesthesia Other Findings   Reproductive/Obstetrics                             Anesthesia Physical Anesthesia Plan  ASA: 3  Anesthesia Plan: General   Post-op Pain Management: Regional block* and Tylenol PO (pre-op)*   Induction: Intravenous  PONV Risk Score and Plan: 3 and Ondansetron, Dexamethasone, Treatment may vary due to age or medical condition and Midazolam  Airway Management Planned: Oral ETT  Additional Equipment: None  Intra-op Plan:   Post-operative Plan: Extubation in OR  Informed Consent: I have reviewed the patients History and Physical, chart, labs and discussed the procedure including the risks, benefits and alternatives for the proposed  anesthesia with the patient or authorized representative who has indicated his/her understanding and acceptance.     Dental advisory given  Plan Discussed with:   Anesthesia Plan Comments:        Anesthesia Quick Evaluation

## 2022-07-14 NOTE — Op Note (Signed)
07/14/2022  1:41 PM  PATIENT:   [name] [name]  71 y.o. female  PRE-OPERATIVE DIAGNOSIS:  Left shoulder rotator cuff tear arthropathy  POST-OPERATIVE DIAGNOSIS: Same  PROCEDURE: Left shoulder reverse arthroplasty lysing a cementless size 5 modular Arthrex stem with a neutral metaphysis, +3 polyethylene insert, 36/+4 glenosphere and a small/+2 baseplate  SURGEON:  Addalee Kavanagh, Metta Clines M.D.  ASSISTANTS: Jenetta Loges PA-C  Jenetta Loges, PA-C was utilized as an Environmental consultant throughout this case, essential for help with positioning the patient, positioning extremity, tissue manipulation, implantation of the prosthesis, suture management, wound closure, and intraoperative decision-making.  ANESTHESIA:   General endotracheal and interscalene block with Exparel  EBL: 150 cc  SPECIMEN: None  Drains: None   PATIENT DISPOSITION:  PACU - hemodynamically stable.    PLAN OF CARE: Discharge to home after PACU  Brief history:  Patient is a 71 year old female with chronic progressive increasing left shoulder pain related to severe rotator cuff tear arthropathy.  Due to her increasing functional medications and failure to respond to prolonged attempts of conservative management, she is brought to the operating this time for planned left shoulder reverse arthroplasty.  Preoperatively, I counseled the patient regarding treatment options and risks versus benefits thereof.  Possible surgical complications were all reviewed including potential for bleeding, infection, neurovascular injury, persistent pain, loss of motion, anesthetic complication, failure of the implant, and possible need for additional surgery. They understand and accept and agrees with our planned procedure.   Procedure detail:  After undergoing routine preop evaluation patient received prophylactic antibiotics and interscalene block with Exparel was established in the holding area by the anesthesia department.  Subsequently placed  supine on the operating table and underwent the smooth induction of a general endotracheal anesthesia.  Placed in the beachchair position and appropriately padded and protected.  The left shoulder girdle region was sterilely prepped and draped in standard fashion.  Timeout was called.  A deltopectoral approach to the left shoulder was made an approximately 8 cm incision.  Skin flaps were elevated dissection carried deep in the deltopectoral interval was then developed from proximal to distal with the vein taken laterally.  The conjoined tendon was mobilized and retracted medially.  The long head biceps tendon was tenodesed to tear for border the pectoralis major tendon with proximal segment unroofed and excised.  The remnant of the rotator cuff superiorly was split the base of the coracoid and there was just a very thin vestigial remnant of the subscapularis anteriorly and so this was divided and tagged but did not appear functional and we did not anticipate repairing the subscap.  Capsular attachments were then divided the anterior and inferior margins of the humeral neck and humeral head was then delivered through the wound.  An extra medullary guide was then used to help find a place femoral head resection which we performed with an oscillating saw at approximately 20 degrees of retroversion.  A metal cap was then placed at the proximal humeral surface after remove the marginal osteophytes.  The glenoid was then exposed and a circumferential labral resection was completed.  A guidepin was then directed into the center of the glenoid and the glenoid was then reamed with the central followed by the peripheral reamer to stable subchondral bone bed.  Preparation completed the central drill and tap for a 25 mm lag screw.  Our baseplate was then assembled and inserted with vancomycin powder applied to the threads of the lag screw.  Excellent purchase was achieved.  The peripheral locking screws were all then placed  using standard technique with excellent fixation.  A 36/+4 glenosphere was then impacted onto the baseplate and the central locking screw was placed.  We then returned our attention back to the humeral metaphysis where the canal was opened and broached up to a size modular 5 for the very tight canal.  A new drill bit The reaming guide was then used.  A trial implant was placed and we did not achieve an appropriate reduction so we went back and we broached setting the implant slightly deeper and ultimately we did achieve reduction with this positioning.  At this point our final implant was then assembled and as we are attempting trial reduction we did notice a cortical violation of the manage medial calcar with subsequent instability of the implant.  With this finding we passed a fiber cerclage around the humeral metaphysis with a limb passed through the eyelet on the medial column of her implant and the cerclage was then provisionally tightened the implant was terminally seated in the cerclage tape was then terminally tightened which allowed excellent fixation and seating.  At this point we opted to utilize a standard +3 poly-.  This was then impacted in the implant.  The shoulder was then reduced which showed good motion stability and soft tissue balance on the chart satisfaction.  The joint was copiously irrigated.  We confirmed that the subscapularis was vestigial and atrophied and we did not repair the subscap.  The deltopectoral interval was reapproximated with a series of figure-of-eight and with Vicryl sutures.  2-0 Monocryl used to close the subcu layer and intracuticular 3-0 Monocryl used to close the skin followed by Dermabond and Aquacel dressing.  The left arm was then placed into a sling and the patient was awakened, extubated, and taken to the recovery in stable condition.  Marin Shutter MD   Contact # [phone]

## 2022-07-14 NOTE — Anesthesia Procedure Notes (Signed)
Procedure Name: Intubation Date/Time: 07/14/2022 12:08 PM  Performed by: Raenette Rover, CRNAPre-anesthesia Checklist: Patient identified, Emergency Drugs available, Suction available and Patient being monitored Patient Re-evaluated:Patient Re-evaluated prior to induction Oxygen Delivery Method: Circle system utilized Preoxygenation: Pre-oxygenation with 100% oxygen Induction Type: IV induction Ventilation: Mask ventilation without difficulty Laryngoscope Size: Mac and 3 Grade View: Grade I Tube type: Oral Tube size: 7.0 mm Number of attempts: 1 Airway Equipment and Method: Stylet Placement Confirmation: ETT inserted through vocal cords under direct vision, positive ETCO2 and breath sounds checked- equal and bilateral Secured at: 19 cm Tube secured with: Tape Dental Injury: Teeth and Oropharynx as per pre-operative assessment

## 2022-07-14 NOTE — Anesthesia Postprocedure Evaluation (Signed)
Anesthesia Post Note  Patient: [name] [name]  Procedure(s) Performed: REVERSE SHOULDER ARTHROPLASTY (Left: Shoulder)     Patient location during evaluation: PACU Anesthesia Type: General Level of consciousness: awake and alert Pain management: pain level controlled Vital Signs Assessment: post-procedure vital signs reviewed and stable Respiratory status: spontaneous breathing, nonlabored ventilation and respiratory function stable Cardiovascular status: blood pressure returned to baseline and stable Postop Assessment: no apparent nausea or vomiting Anesthetic complications: no   No notable events documented.  Last Vitals:  Vitals:   07/14/22 1500 07/14/22 1544  BP: (!) 144/90 (!) 110/56  Pulse: 66 60  Resp: 14   Temp: 36.5 C   SpO2: 95% 96%    Last Pain:  Vitals:   07/14/22 1544  TempSrc:   PainSc: 0-No pain                 Lidia Collum

## 2022-07-14 NOTE — Discharge Instructions (Signed)

## 2022-07-14 NOTE — Progress Notes (Signed)
Updated Dr. Kerin Perna of glucose 237.   No orders at this time.

## 2022-07-14 NOTE — Evaluation (Signed)
Occupational Therapy Evaluation Patient Details Name: [name] [name] MRN: [id number] DOB: Dec 02, 1950 Today's Date: 07/14/2022   History of Present Illness [name] [name] is a 71 yr old female who is s/p a L shoulder reverse arthroplasty on 07-14-2022, due to L shoulder rotator cuff tear arthropathy.   Clinical Impression   Patient is s/p shoulder replacement without functional use of left upper extremity, secondary to effects of surgery, interscalene block,  and shoulder precautions. Therapist provided education and instruction to patient and her mother with regards to ROM/exercises, post-op precautions, UE positioning, donning upper extremity clothing, and recommendations for bathing while maintaining shoulder precautions, use of ice for pain and edema management and correctly donning/doffing sling. Patient and her mother verbalized understanding and demonstrated understanding as needed. Patient needed assistance to donn shirt, underwear, pants, socks and shoes, with instruction on compensatory strategies to perform ADLs. Patient to follow up with MD for further therapy needs.        Recommendations for follow up therapy are one component of a multi-disciplinary discharge planning process, led by the attending physician.  Recommendations may be updated based on patient status, additional functional criteria and insurance authorization.   Follow Up Recommendations  Follow physician's recommendations for discharge plan and follow up therapies     Assistance Recommended at Discharge Intermittent Supervision/Assistance  Patient can return home with the following Help with stairs or ramp for entrance;A little help with bathing/dressing/bathroom;Assistance with cooking/housework;Assist for transportation    Functional Status Assessment  Patient has had a recent decline in their functional status and demonstrates the ability to make significant improvements in function in a reasonable and  predictable amount of time.  Equipment Recommendations  Tub/shower seat       Precautions / Restrictions Precautions Precautions: Shoulder Shoulder Interventions: Shoulder sling/immobilizer Precaution Comments: If sitting in controlled environment, ok to come out of sling to give neck a break. Please sleep in it to protect until follow up in office.     OK to use operative arm for feeding, hygiene and ADLs.   Ok to instruct Pendulums and lap slides as exercises. Ok to use operative arm within the following parameters for ADL purposes     New ROM (8/18)   Ok for PROM, AAROM, AROM within pain tolerance and within the following ROM   ER 20   ABD 45   FE 60 Required Braces or Orthoses: Sling Restrictions Weight Bearing Restrictions: Yes LUE Weight Bearing: Non weight bearing      Mobility Bed Mobility    General bed mobility comments: pt was received seated in the bedside chair    Transfers Overall transfer level: Needs assistance   Transfers: Sit to/from Stand Sit to Stand: Supervision              ADL either performed or assessed with clinical judgement      Vision Baseline Vision/History: 1 Wears glasses Patient Visual Report: No change from baseline              Pertinent Vitals/Pain Pain Assessment Pain Assessment: 0-10 Pain Score: 3  Pain Location: L shoulder Pain Intervention(s):  (sling adjusted, ice pad applied)     Hand Dominance  (She reported being ambidextrous)      Communication Communication Communication: No difficulties   Cognition Arousal/Alertness: Awake/alert Behavior During Therapy: WFL for tasks assessed/performed Overall Cognitive Status: Within Functional Limits for tasks assessed          General Comments: Oriented x4, able to follow commands without  difficulty           Shoulder Instructions Shoulder Instructions Donning/doffing shirt without moving shoulder: Moderate assistance Donning/doffing sling/immobilizer: Moderate  assistance Correct positioning of sling/immobilizer: Moderate assistance Pendulum exercises (written home exercise program): Patient able to independently direct caregiver ROM for elbow, wrist and digits of operated UE: Patient able to independently direct caregiver Sling wearing schedule (on at all times/off for ADL's): Patient able to independently direct caregiver Proper positioning of operated UE when showering: Patient able to independently direct caregiver Positioning of UE while sleeping: Patient able to independently direct caregiver    Home Living Family/patient expects to be discharged to:: Private residence Living Arrangements: Alone Available Help at Discharge: Family;Available PRN/intermittently Type of Home: House Home Access: Stairs to enter [employer] of Steps: 2 Entrance Stairs-Rails: None Home Layout: One level     Bathroom Shower/Tub: Tub/shower unit         Home Equipment: Kasandra Knudsen - single point   Additional Comments: She plans to discharge to her mother's for a couple days before returning home alone.      Prior Functioning/Environment Prior Level of Function : Independent/Modified Independent             Mobility Comments: Use of a cane most of the time for ambulation . ADLs Comments: Independetn with ADLs, driving, cooking, and cleaning.        OT Problem List: Decreased range of motion;Impaired UE functional use            OT Frequency:  N/A       AM-PAC OT "6 Clicks" Daily Activity     Outcome Measure Help from another person eating meals?: None Help from another person taking care of personal grooming?: None Help from another person toileting, which includes using toliet, bedpan, or urinal?: A Little Help from another person bathing (including washing, rinsing, drying)?: A Little Help from another person to put on and taking off regular upper body clothing?: A Little Help from another person to put on and taking off regular  lower body clothing?: A Little 6 Click Score: 20   End of Session Nurse Communication: Other (comment) (shoulder instruction completed)  Activity Tolerance: Patient tolerated treatment well Patient left: with family/visitor present  OT Visit Diagnosis: Muscle weakness (generalized) (M62.81)                Time: 7741-2878 OT Time Calculation (min): 36 min Charges:  OT General Charges $OT Visit: 1 Visit OT Evaluation $OT Eval Low Complexity: 1 Low OT Treatments $Self Care/Home Management : 8-22 mins    Leota Sauers, OTR/L 07/14/2022, 4:43 PM

## 2022-07-20 ENCOUNTER — Encounter (HOSPITAL_COMMUNITY): Payer: Self-pay | Admitting: Orthopedic Surgery

## 2022-10-11 ENCOUNTER — Other Ambulatory Visit (HOSPITAL_COMMUNITY): Payer: Self-pay | Admitting: *Deleted

## 2022-10-13 ENCOUNTER — Ambulatory Visit (HOSPITAL_COMMUNITY)
Admission: RE | Admit: 2022-10-13 | Discharge: 2022-10-13 | Disposition: A | Discharge status: Home / Self Care | Payer: 59 | Source: Ambulatory Visit | Attending: Internal Medicine | Admitting: Internal Medicine

## 2022-10-13 DIAGNOSIS — M810 Age-related osteoporosis without current pathological fracture: Secondary | ICD-10-CM | POA: Diagnosis present

## 2022-10-13 MED ORDER — DENOSUMAB 60 MG/ML ~~LOC~~ SOSY
60.0000 mg | PREFILLED_SYRINGE | Freq: Once | SUBCUTANEOUS | Status: AC
  Administered 2022-10-13: 60 mg via SUBCUTANEOUS

## 2022-10-13 MED ORDER — DENOSUMAB 60 MG/ML ~~LOC~~ SOSY
PREFILLED_SYRINGE | SUBCUTANEOUS | Status: AC
  Filled 2022-10-13: qty 1

## 2022-12-07 ENCOUNTER — Other Ambulatory Visit: Payer: Self-pay | Admitting: Physician Assistant

## 2023-01-05 ENCOUNTER — Other Ambulatory Visit: Payer: Self-pay | Admitting: Cardiology

## 2023-02-07 ENCOUNTER — Other Ambulatory Visit: Payer: Self-pay | Admitting: Cardiology

## 2023-02-24 ENCOUNTER — Other Ambulatory Visit: Payer: Self-pay | Admitting: Cardiology

## 2023-03-28 ENCOUNTER — Other Ambulatory Visit: Payer: Self-pay | Admitting: Internal Medicine

## 2023-03-28 DIAGNOSIS — F17200 Nicotine dependence, unspecified, uncomplicated: Secondary | ICD-10-CM

## 2023-03-28 DIAGNOSIS — Z0000 Encounter for general adult medical examination without abnormal findings: Secondary | ICD-10-CM

## 2023-03-29 ENCOUNTER — Encounter: Payer: Self-pay | Admitting: Internal Medicine

## 2023-04-05 ENCOUNTER — Encounter: Payer: Self-pay | Admitting: Neurology

## 2023-04-13 ENCOUNTER — Other Ambulatory Visit: Payer: Self-pay | Admitting: Cardiology

## 2023-04-17 ENCOUNTER — Other Ambulatory Visit (HOSPITAL_COMMUNITY): Payer: Self-pay | Admitting: *Deleted

## 2023-04-18 ENCOUNTER — Ambulatory Visit (HOSPITAL_COMMUNITY)
Admission: RE | Admit: 2023-04-18 | Discharge: 2023-04-18 | Disposition: A | Discharge status: Home / Self Care | Payer: Medicare Other | Source: Ambulatory Visit | Attending: Internal Medicine | Admitting: Internal Medicine

## 2023-04-18 DIAGNOSIS — M810 Age-related osteoporosis without current pathological fracture: Secondary | ICD-10-CM | POA: Insufficient documentation

## 2023-04-18 MED ORDER — DENOSUMAB 60 MG/ML ~~LOC~~ SOSY
PREFILLED_SYRINGE | SUBCUTANEOUS | Status: AC
  Administered 2023-04-18: 60 mg via SUBCUTANEOUS
  Filled 2023-04-18: qty 1

## 2023-04-18 MED ORDER — DENOSUMAB 60 MG/ML ~~LOC~~ SOSY: 60.0000 mg | PREFILLED_SYRINGE | Freq: Once | SUBCUTANEOUS | Status: AC

## 2023-04-24 ENCOUNTER — Ambulatory Visit: Payer: 59 | Admitting: Neurology

## 2023-04-24 NOTE — Progress Notes (Deleted)
Assessment/Plan:   ***Tremor  -Discontinue primidone.  It interacts with her Eliquis  Subjective:   [name] [name] was seen today in the movement disorders clinic for neurologic consultation at the request of [name], MD.  The consultation is for the evaluation of resting tremor.  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  Notes indicate that she is currently on 50 mg of primidone daily.  Tremor: {yes no:314532}   How long has it been going on? ***  At rest or with activation?  ***  When is it noted the most?  ***  Fam hx of tremor?  {yes [id number]}  Located where?  ***  Affected by caffeine:  {yes no:314532}  Affected by alcohol:  {yes no:314532}  Affected by stress:  {yes no:314532}  Affected by fatigue:  {yes no:314532}  Spills soup if on spoon:  {yes no:314532}  Spills glass of liquid if full:  {yes no:314532}  Affects ADL's (tying shoes, brushing teeth, etc):  {yes no:314532}  Tremor inducing meds:  {yes no:314532}  Other Specific Symptoms:  Voice: *** Sleep: ***  Vivid Dreams:  {yes no:314532}  Acting out dreams:  {yes no:314532} Wet Pillows: {yes no:314532} Postural symptoms:  {yes no:314532}  Falls?  {yes no:314532} Bradykinesia symptoms: {parkinson brady:18041} Loss of smell:  {yes no:314532} Loss of taste:  {yes no:314532} Urinary Incontinence:  {yes no:314532} Difficulty Swallowing:  {yes no:314532} Handwriting, micrographia: {yes no:314532} Trouble with ADL's:  {yes no:314532}  Trouble buttoning clothing: {yes no:314532} Depression:  {yes no:314532} Memory changes:  {yes no:314532} Hallucinations:  {yes no:314532}  visual distortions: {yes no:314532} N/V:  {yes no:314532} Lightheaded:  {yes no:314532}  Syncope: {yes no:314532} Diplopia:  {yes no:314532} Dyskinesia:  {yes no:314532}  Last CT of the brain was  November, 2021 after a fall and was unremarkable intracranially.  PREVIOUS MEDICATIONS: {Parkinson's RX:18200}  ALLERGIES:   Allergies  Allergen Reactions   Bee Venom Anaphylaxis   Ceclor [Cefaclor] Other (See Comments)    Reaction=burning all over   Penicillins Anaphylaxis    "CLOSES OFF MY BREATHING" Has patient had a PCN reaction causing immediate rash, facial/tongue/throat swelling, SOB or lightheadedness with hypotension: Yes Has patient had a PCN reaction causing severe rash involving mucus membranes or skin necrosis: No Has patient had a PCN reaction that required hospitalization No Has patient had a PCN reaction occurring within the last 10 years: No If all of the above answers are "NO", then may proceed with Cephalosporin use.    Pneumococcal Vaccines Other (See Comments)    PP-23 vaccine; had BIG local red reaction with heat.    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, J44.9   ALPRAZolam (XANAX) 1 mg, Oral, 3 times daily   atorvastatin (LIPITOR) 80 mg, Oral, Daily at bedtime   azelastine (ASTELIN) 0.1 % nasal spray 1 spray, Each Nare, At bedtime PRN   beta carotene 25,000 Units, Oral, 3 times weekly, At night   cholecalciferol (VITAMIN D) 1,000 Units, Oral, Every evening   cyanocobalamin (VITAMIN B12) 1,000 mcg, Oral, Every  evening   cyclobenzaprine (FLEXERIL) 10 mg, Oral, At bedtime PRN   Dexilant 60 mg, Oral, Daily before breakfast   docusate sodium (COLACE) 250 mg, Oral, 3 times daily   ELIQUIS 5 MG TABS tablet TAKE 1 TABLET BY MOUTH TWICE DAILY   EPINEPHrine (EPIPEN 2-PAK) 0.3 mg/0.3 mL IJ SOAJ injection USE AS DIRECTED   Flaxseed Oil 1,200 mg, Oral, Daily  at bedtime   furosemide (LASIX) 20 mg, Oral, Daily   glipiZIDE (GLUCOTROL) 5 mg, Oral, 2 times daily before meals   isosorbide mononitrate (IMDUR) 30 mg, Oral, Daily, Please call [phone] to schedule an overdue appointment with Dr. Loman Brooklyn for future refills. Thank you final attempt.   losartan (COZAAR) 12.5 mg, Oral, Daily at bedtime   montelukast (SINGULAIR) 10 mg, Oral, Every morning   Multiple Vitamin (MULTIVITAMIN WITH MINERALS) TABS tablet 1 tablet, Oral, Every evening, Centrum Silver   mupirocin ointment (BACTROBAN) 2 % 2 application , Topical, 2 times daily PRN   ondansetron (ZOFRAN) 4 mg, Oral, Every 8 hours PRN   oxyCODONE-acetaminophen (PERCOCET) 5-325 MG tablet 1 tablet, Oral, Every 4 hours PRN   PARoxetine (PAXIL) 40 mg, Oral, Every morning   primidone (MYSOLINE) 50 mg, Oral, Daily at bedtime   PROAIR HFA 108 (90 Base) MCG/ACT inhaler INHALE TWO PUFFS INTO THE LUNGS EVERY 6 HOURS AS NEEDED FOR WHEEZING OR SHORTNESS OF BREATH   Prolia 60 mg, Subcutaneous, Every 6 months    Objective:   PHYSICAL EXAMINATION:    VITALS:  There were no vitals filed for this visit.  GEN:  The patient appears stated age and is in NAD. HEENT:  Normocephalic, atraumatic.  The mucous membranes are moist. The superficial temporal arteries are without ropiness or tenderness. CV:  RRR Lungs:  CTAB Neck/HEME:  There are no carotid bruits bilaterally.  Neurological examination:  Orientation: The patient is alert and oriented x3.  Cranial nerves: There is good facial symmetry.  Extraocular muscles are intact. The visual fields are full to confrontational testing. The speech is fluent and clear. Soft palate rises symmetrically and there is no tongue deviation. Hearing is intact to conversational tone. Sensation: Sensation is intact to light touch throughout (facial, trunk, extremities). Vibration is intact at the bilateral big toe. There is no extinction with double simultaneous stimulation.   Motor: Strength is 5/5 in the bilateral upper and lower extremities.   Shoulder shrug is equal and symmetric.  There is no pronator drift. Deep tendon reflexes: Deep tendon reflexes are 2/4 at the bilateral biceps, triceps, brachioradialis, patella and achilles. Plantar responses are downgoing bilaterally.  Movement examination: Tone: There is ***tone in the bilateral upper extremities.  The tone in the lower extremities is ***.  Abnormal movements: *** Coordination:  There is *** decremation with RAM's, *** Gait and Station: The patient has *** difficulty arising out of a deep-seated chair without the use of the hands. The patient's stride length is ***.  The patient has a *** pull test.     I have reviewed and interpreted the following labs independently   Chemistry      Component Value Date/Time   NA 137 07/05/2022 1431   NA 138 02/02/2021 1411   K 3.6 07/05/2022 1431   CL 104 07/05/2022 1431   CO2 23 07/05/2022 1431   BUN 18 07/05/2022 1431   BUN 10 02/02/2021 1411   CREATININE 0.62 07/05/2022 1431   CREATININE 0.63 12/07/2012 1445      Component Value Date/Time  CALCIUM 9.0 07/05/2022 1431   ALKPHOS 73 03/25/2018 0440   AST 20 03/25/2018 0440   ALT 21 03/25/2018 0440   BILITOT 0.4 03/25/2018 0440      Lab Results  Component Value Date   TSH 6.970 (H) 11/23/2020   Lab Results  Component Value Date   WBC 6.4 07/05/2022   HGB 12.2 07/05/2022   HCT 38.0 07/05/2022   MCV 95.5 07/05/2022   PLT 178 07/05/2022   She had more recent lab work done at primary care.  Sodium was 139, potassium 3.8, chloride 105, CO2 24, AST 26, ALT 28, BUN 17, white blood cell 6.2, hemoglobin 13.7, hematocrit 41.7, platelets 192.  Ferritin 30.9.  TSH 4.13.  Hemoglobin A1c 6.7.   Total time spent on today's visit was ***greater than 60 minutes, including both face-to-face time and nonface-to-face time.  Time included that spent on review of records (prior notes available to me/labs/imaging if  pertinent), discussing treatment and goals, answering patient's questions and coordinating care.  Cc:  [name], MD

## 2023-04-26 ENCOUNTER — Encounter: Payer: Self-pay | Admitting: Neurology

## 2023-04-26 ENCOUNTER — Ambulatory Visit: Payer: Medicare Other | Admitting: Neurology

## 2023-05-11 ENCOUNTER — Other Ambulatory Visit: Payer: Self-pay | Admitting: Interventional Cardiology

## 2023-05-17 ENCOUNTER — Ambulatory Visit: Payer: Medicare Other | Admitting: Neurology

## 2023-05-26 NOTE — Progress Notes (Unsigned)
Assessment/Plan:   ***Tremor  -Discontinue primidone.  It interacts with her Eliquis  Subjective:   [name] [name] was seen today in the movement disorders clinic for neurologic consultation at the request of [name], MD.  The consultation is for the evaluation of resting tremor.  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  Notes indicate that she is currently on 50 mg of primidone daily.  Tremor: {yes no:314532}   How long has it been going on? ***  At rest or with activation?  ***  When is it noted the most?  ***  Fam hx of tremor?  {yes [id number]}  Located where?  ***  Affected by caffeine:  {yes no:314532}  Affected by alcohol:  {yes no:314532}  Affected by stress:  {yes no:314532}  Affected by fatigue:  {yes no:314532}  Spills soup if on spoon:  {yes no:314532}  Spills glass of liquid if full:  {yes no:314532}  Affects ADL's (tying shoes, brushing teeth, etc):  {yes no:314532}  Tremor inducing meds:  {yes no:314532}  Other Specific Symptoms:  Voice: *** Sleep: ***  Vivid Dreams:  {yes no:314532}  Acting out dreams:  {yes no:314532} Wet Pillows: {yes no:314532} Postural symptoms:  {yes no:314532}  Falls?  {yes no:314532} Bradykinesia symptoms: {parkinson brady:18041} Loss of smell:  {yes no:314532} Loss of taste:  {yes no:314532} Urinary Incontinence:  {yes no:314532} Difficulty Swallowing:  {yes no:314532} Handwriting, micrographia: {yes no:314532} Trouble with ADL's:  {yes no:314532}  Trouble buttoning clothing: {yes no:314532} Depression:  {yes no:314532} Memory changes:  {yes no:314532} Hallucinations:  {yes no:314532}  visual distortions: {yes no:314532} N/V:  {yes no:314532} Lightheaded:  {yes no:314532}  Syncope: {yes no:314532} Diplopia:  {yes no:314532} Dyskinesia:  {yes no:314532}  Last CT of the brain was  November, 2021 after a fall and was unremarkable intracranially.  PREVIOUS MEDICATIONS: {Parkinson's RX:18200}  ALLERGIES:   Allergies  Allergen Reactions   Bee Venom Anaphylaxis   Ceclor [Cefaclor] Other (See Comments)    Reaction=burning all over   Penicillins Anaphylaxis    "CLOSES OFF MY BREATHING" Has patient had a PCN reaction causing immediate rash, facial/tongue/throat swelling, SOB or lightheadedness with hypotension: Yes Has patient had a PCN reaction causing severe rash involving mucus membranes or skin necrosis: No Has patient had a PCN reaction that required hospitalization No Has patient had a PCN reaction occurring within the last 10 years: No If all of the above answers are "NO", then may proceed with Cephalosporin use.    Pneumococcal Vaccines Other (See Comments)    PP-23 vaccine; had BIG local red reaction with heat.    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, J44.9   ALPRAZolam (XANAX) 1 mg, Oral, 3 times daily   atorvastatin (LIPITOR) 80 mg, Oral, Daily at bedtime   azelastine (ASTELIN) 0.1 % nasal spray 1 spray, Each Nare, At bedtime PRN   beta carotene 25,000 Units, Oral, 3 times weekly, At night   cholecalciferol (VITAMIN D) 1,000 Units, Oral, Every evening   cyanocobalamin (VITAMIN B12) 1,000 mcg, Oral, Every  evening   cyclobenzaprine (FLEXERIL) 10 mg, Oral, At bedtime PRN   Dexilant 60 mg, Oral, Daily before breakfast   docusate sodium (COLACE) 250 mg, Oral, 3 times daily   ELIQUIS 5 MG TABS tablet TAKE 1 TABLET BY MOUTH TWICE DAILY   EPINEPHrine (EPIPEN 2-PAK) 0.3 mg/0.3 mL IJ SOAJ injection USE AS DIRECTED   Flaxseed Oil 1,200 mg, Oral, Daily  at bedtime   furosemide (LASIX) 20 mg, Oral, Daily   glipiZIDE (GLUCOTROL) 5 mg, Oral, 2 times daily before meals   isosorbide mononitrate (IMDUR) 30 mg, Oral, Daily, Please call [phone] to schedule an overdue appointment with Dr. Loman Brooklyn for future refills. Thank you final attempt.   losartan (COZAAR) 12.5 mg, Oral, Daily at bedtime   montelukast (SINGULAIR) 10 mg, Oral, Every morning   Multiple Vitamin (MULTIVITAMIN WITH MINERALS) TABS tablet 1 tablet, Oral, Every evening, Centrum Silver   mupirocin ointment (BACTROBAN) 2 % 2 application , Topical, 2 times daily PRN   ondansetron (ZOFRAN) 4 mg, Oral, Every 8 hours PRN   oxyCODONE-acetaminophen (PERCOCET) 5-325 MG tablet 1 tablet, Oral, Every 4 hours PRN   PARoxetine (PAXIL) 40 mg, Oral, Every morning   primidone (MYSOLINE) 50 mg, Oral, Daily at bedtime   PROAIR HFA 108 (90 Base) MCG/ACT inhaler INHALE TWO PUFFS INTO THE LUNGS EVERY 6 HOURS AS NEEDED FOR WHEEZING OR SHORTNESS OF BREATH   Prolia 60 mg, Subcutaneous, Every 6 months    Objective:   PHYSICAL EXAMINATION:    VITALS:  There were no vitals filed for this visit.  GEN:  The patient appears stated age and is in NAD. HEENT:  Normocephalic, atraumatic.  The mucous membranes are moist. The superficial temporal arteries are without ropiness or tenderness. CV:  RRR Lungs:  CTAB Neck/HEME:  There are no carotid bruits bilaterally.  Neurological examination:  Orientation: The patient is alert and oriented x3.  Cranial nerves: There is good facial symmetry.  Extraocular muscles are intact. The visual fields are full to confrontational testing. The speech is fluent and clear. Soft palate rises symmetrically and there is no tongue deviation. Hearing is intact to conversational tone. Sensation: Sensation is intact to light touch throughout (facial, trunk, extremities). Vibration is intact at the bilateral big toe. There is no extinction with double simultaneous stimulation.   Motor: Strength is 5/5 in the bilateral upper and lower extremities.   Shoulder shrug is equal and symmetric.  There is no pronator drift. Deep tendon reflexes: Deep tendon reflexes are 2/4 at the bilateral biceps, triceps, brachioradialis, patella and achilles. Plantar responses are downgoing bilaterally.  Movement examination: Tone: There is ***tone in the bilateral upper extremities.  The tone in the lower extremities is ***.  Abnormal movements: *** Coordination:  There is *** decremation with RAM's, *** Gait and Station: The patient has *** difficulty arising out of a deep-seated chair without the use of the hands. The patient's stride length is ***.  The patient has a *** pull test.     I have reviewed and interpreted the following labs independently   Chemistry      Component Value Date/Time   NA 137 07/05/2022 1431   NA 138 02/02/2021 1411   K 3.6 07/05/2022 1431   CL 104 07/05/2022 1431   CO2 23 07/05/2022 1431   BUN 18 07/05/2022 1431   BUN 10 02/02/2021 1411   CREATININE 0.62 07/05/2022 1431   CREATININE 0.63 12/07/2012 1445      Component Value Date/Time  CALCIUM 9.0 07/05/2022 1431   ALKPHOS 73 03/25/2018 0440   AST 20 03/25/2018 0440   ALT 21 03/25/2018 0440   BILITOT 0.4 03/25/2018 0440      Lab Results  Component Value Date   TSH 6.970 (H) 11/23/2020   Lab Results  Component Value Date   WBC 6.4 07/05/2022   HGB 12.2 07/05/2022   HCT 38.0 07/05/2022   MCV 95.5 07/05/2022   PLT 178 07/05/2022   She had more recent lab work done at primary care.  Sodium was 139, potassium 3.8, chloride 105, CO2 24, AST 26, ALT 28, BUN 17, white blood cell 6.2, hemoglobin 13.7, hematocrit 41.7, platelets 192.  Ferritin 30.9.  TSH 4.13.  Hemoglobin A1c 6.7.   Total time spent on today's visit was ***greater than 60 minutes, including both face-to-face time and nonface-to-face time.  Time included that spent on review of records (prior notes available to me/labs/imaging if  pertinent), discussing treatment and goals, answering patient's questions and coordinating care.  Cc:  [name], MD

## 2023-06-01 ENCOUNTER — Encounter: Payer: Self-pay | Admitting: Neurology

## 2023-06-01 ENCOUNTER — Ambulatory Visit: Payer: Medicare Other | Admitting: Neurology

## 2023-06-01 VITALS — BP 122/78 | HR 73 | Ht 61.0 in | Wt 146.4 lb

## 2023-06-01 DIAGNOSIS — I480 Paroxysmal atrial fibrillation: Secondary | ICD-10-CM | POA: Diagnosis not present

## 2023-06-01 DIAGNOSIS — G250 Essential tremor: Secondary | ICD-10-CM

## 2023-06-01 NOTE — Patient Instructions (Signed)
STOP primidone We discussed surgical interventions We discussed weighted gloves, spoons and forks We discussed Berniece Andreas Trio  The physicians and staff at [facility] are committed to providing excellent care. You may receive a survey requesting feedback about your experience at our office. We strive to receive "very good" responses to the survey questions. If you feel that your experience would prevent you from giving the office a "very good " response, please contact our office to try to remedy the situation. We may be reached at [phone]. Thank you for taking the time out of your busy day to complete the survey.

## 2023-06-07 NOTE — Progress Notes (Deleted)
Cardiology Office Note Date:  06/07/2023  Patient ID:  [name] [name], [name] [name] 02-Oct-1950, MRN [id number] PCP:  [name], MD  Electrophysiologist: Dr. Elberta Fortis    Chief Complaint:  *** annual visit  History of Present Illness: [name] [name] is a 72 y.o. female with history of COPD, HTN, HLD, DM, CVA, essential tremor, GERD, Takotsubo CM with recovered EF, AFib   She come sin today to be seen for Dr. Elberta Fortis, last seen by him 11/23/20, c/o fatigue, SOB, planned for labs, update her echo  Echo looked OK, LVEF 67%, no WMA, no significant VHD  She saw Dr. Eldridge Dace July 2022, seems a work in visit for DOD with CP, planned for coronary CTa  CT with mild NOD, over-read also looked ok   I saw her 12/15/21:  She is doing well Lives independently, cares for her home, yard by herself. She reports SOB/DOE since childhood, born with asthma, all her life has had to pace herself. NO changes in her exertional capacity When working hard "like digging holes in the yard" she is aware of her heart beat feels fast but wil slow or rest and it settles very quickly.  Otherwise no cardiac concerns, or awareness. No near syncope or syncope. No rest SOB No bleeding or signs of bleeding No changes were made  She Dr. Eldridge Dace again 03/17/22 for some chest discomfort, prior CTa coronaries reassuring, suspect 2/2 stress. Slight change to her EKG and planned to update her echo  Normal LV/RV/valvular function   *** symptoms, AF burden *** eliquis, dose, bleeding, labs  AFib/AAD Hx Diagnosed post-op knee surgery, complicated by flash edema 2015 Multaq 2017 failed to maintain SR Flecainide Nov 2017 > stopped post ablation (seems 2/2 icLBBB) PVI ablation 08/03/2016 Nodal blockers limited 2/2 bradycardia   Past Medical History:  Diagnosis Date   Anemia    hx of years ago    Anxiety    Aortic valve disorders    Arthritis    Asthma    Barrett esophagus    Bipolar disorder (HCC)    CHF  (congestive heart failure) (HCC)    Chronic bronchitis (HCC)    COPD (chronic obstructive pulmonary disease) (HCC)    "just have a touch" (08/03/2016)   Depression    Dyspnea    sob with exertion   Dysrhythmia    Esophageal reflux    Esophageal stricture    GAD (generalized anxiety disorder)    GERD (gastroesophageal reflux disease)    Heart murmur    Hiatal hernia    History of blood transfusion 1970s; 01/08/2013   "low blood count"   History of nuclear stress test 01/2016   low risk study   History of stomach ulcers    "some have bleed"   Hyperlipemia    Hypertension 12/07/2012   Insomnia, unspecified    Myocardial infarction (HCC) 01/08/2013   '06-Chest pain-no stent-dx. MI-stress related   Osteoporosis    Personal history of colonic polyps 09/2010   TUBULAR ADENOMAS (X3); NEGATIVE FOR HIGH GRADE DYSPLASIA OR MALIGNANCY.   Pneumonia    "several times" (08/03/2016)   Stroke [facility])    "2 or 3"; remains with some right sided weaknes (08/03/2016)   Takotsubo syndrome    Tremor, essential 03/20/2017   Type II diabetes mellitus (HCC)    Urinary frequency    AND INCONTINENCE    Past Surgical History:  Procedure Laterality Date   ANTERIOR CERVICAL DECOMP/DISCECTOMY FUSION N/A 01/31/2019   Procedure:  ANTERIOR CERVICAL DECOMPRESSION/DISCECTOMY FUSION C5-6;  Surgeon: Venita Lick, MD;  Location: [facility] OR;  Service: Orthopedics;  Laterality: N/A;  3 hrs   ATRIAL FIBRILLATION ABLATION  08/03/2016   CARDIAC CATHETERIZATION     normal per Dr Elease Hashimoto in note dated 02/06/12    CARDIAC CATHETERIZATION     "I've had 2 or 3" (08/03/2016)   CARPAL TUNNEL RELEASE Right    CATARACT EXTRACTION W/ INTRAOCULAR LENS  IMPLANT, BILATERAL     COLONOSCOPY W/ BIOPSIES AND POLYPECTOMY     DILATION AND CURETTAGE OF UTERUS     S/P miscarriage   ELECTROPHYSIOLOGIC STUDY N/A 08/03/2016   Procedure: Atrial Fibrillation Ablation;  Surgeon: Will Jorja Loa, MD;  Location: [facility];  Service:  Cardiovascular;  Laterality: N/A;   ESOPHAGEAL DILATION  01-08-13   2 yrs ago   HEEL SPUR SURGERY Left 2005   KNEE ARTHROSCOPY  02/28/2012   Procedure: ARTHROSCOPY KNEE;  Surgeon: Jacki Cones, MD;  Location: [facility] ORS;  Service: Orthopedics;  Laterality: Left;   LAPAROSCOPIC CHOLECYSTECTOMY  03/2002   REVERSE SHOULDER ARTHROPLASTY Right 10/10/2019   Procedure: REVERSE SHOULDER ARTHROPLASTY;  Surgeon: Francena Hanly, MD;  Location: [facility];  Service: Orthopedics;  Laterality: Right;    REVERSE SHOULDER ARTHROPLASTY Left 07/14/2022   Procedure: REVERSE SHOULDER ARTHROPLASTY;  Surgeon: Francena Hanly, MD;  Location: [facility];  Service: Orthopedics;  Laterality: Left;    SHOULDER OPEN ROTATOR CUFF REPAIR Right 2011   TONSILLECTOMY AND ADENOIDECTOMY  1965   TOTAL KNEE ARTHROPLASTY Left 01/17/2013   Procedure: LEFT TOTAL KNEE ARTHROPLASTY;  Surgeon: Jacki Cones, MD;  Location: [facility];  Service: Orthopedics;  Laterality: Left;   TOTAL KNEE ARTHROPLASTY Right 06/19/2014   Procedure: RIGHT TOTAL KNEE ARTHROPLASTY;  Surgeon: Jacki Cones, MD;  Location: [facility];  Service: Orthopedics;  Laterality: Right;   TUBAL LIGATION     VAGINAL HYSTERECTOMY     "partial"   WRIST FLEXION TENDON TENOTOMIES AND PROXIMAL CORPECTOMY W/ WRIST ARTHRODESIS&ILIAC CREST BONE GRAFT      Current Outpatient Medications  Medication Sig Dispense Refill   acetaminophen (TYLENOL) 500 MG tablet Take 500-1,000 mg by mouth every 6 (six) hours as needed (pain).     albuterol (PROVENTIL) (2.5 MG/3ML) 0.083% nebulizer solution Take 3 mLs (2.5 mg total) by nebulization every 6 (six) hours as needed for wheezing. DX  491.9, J44.9 360 mL 12   ALPRAZolam (XANAX) 1 MG tablet Take 1 mg by mouth in the morning, at noon, and at bedtime.     atorvastatin (LIPITOR) 80 MG tablet Take 80 mg by mouth at bedtime.      azelastine (ASTELIN) 0.1 % nasal spray Place 1 spray into both nostrils at bedtime as needed for allergies.      beta carotene 16109 UNIT capsule Take 25,000 Units by mouth 3 (three) times a week. At night     cholecalciferol (VITAMIN D) 1000 UNITS tablet Take 1,000 Units by mouth every evening.     cyclobenzaprine (FLEXERIL) 10 MG tablet Take 1 tablet (10 mg total) by mouth at bedtime as needed (pain). (Patient not taking: Reported on 06/01/2023) 30 tablet 1   denosumab (PROLIA) 60 MG/ML SOSY injection Inject 60 mg into the skin every 6 (six) months.     dexlansoprazole (DEXILANT) 60 MG capsule Take 60 mg by mouth daily before breakfast.     docusate sodium (COLACE) 250 MG capsule Take 250 mg by mouth in the morning, at noon,  and at bedtime.     ELIQUIS 5 MG TABS tablet TAKE 1 TABLET BY MOUTH TWICE DAILY 60 tablet 5   EPINEPHrine (EPIPEN 2-PAK) 0.3 mg/0.3 mL IJ SOAJ injection USE AS DIRECTED 2 each 12   Flaxseed, Linseed, (FLAXSEED OIL) 1200 MG CAPS Take 1,200 mg by mouth at bedtime.     furosemide (LASIX) 20 MG tablet TAKE ONE TABLET ONCE DAILY 90 tablet 0   glipiZIDE (GLUCOTROL) 5 MG tablet Take 5 mg by mouth 2 (two) times daily before a meal.     isosorbide mononitrate (IMDUR) 30 MG 24 hr tablet Take 1 tablet (30 mg total) by mouth daily. Please call [phone] to schedule an overdue appointment with Dr. Loman Brooklyn for future refills. Thank you final attempt. 15 tablet 0   losartan (COZAAR) 25 MG tablet TAKE 1/2 TABLET AT BEDTIME 90 tablet 0   montelukast (SINGULAIR) 10 MG tablet Take 10 mg by mouth in the morning.     Multiple Vitamin (MULTIVITAMIN WITH MINERALS) TABS tablet Take 1 tablet by mouth every evening. Centrum Silver     mupirocin ointment (BACTROBAN) 2 % Apply 2 application  topically 2 (two) times daily as needed (wounds).     PROAIR HFA 108 (90 Base) MCG/ACT inhaler INHALE TWO PUFFS INTO THE LUNGS EVERY 6 HOURS AS NEEDED FOR WHEEZING OR SHORTNESS OF BREATH 8.5 g 11   vitamin B-12 (CYANOCOBALAMIN) 1000 MCG tablet Take 1,000 mcg by mouth every evening.      No current  facility-administered medications for this visit.   Facility-Administered Medications Ordered in Other Visits  Medication Dose Route Frequency Provider Last Rate Last Admin   tranexamic acid (CYKLOKAPRON) 2,000 mg in sodium chloride 0.9 % 50 mL Topical Application  2,000 mg Topical Once [place], [facility], PA-C        Allergies:   Bee venom, Ceclor [cefaclor], Penicillins, Pneumococcal vaccines, Codeine, Cortisone, and Boniva [ibandronate sodium]   Social History:  The patient  reports that she quit smoking about 3 years ago. Her smoking use included cigarettes. She started smoking about 53 years ago. She has a 25 pack-year smoking history. She quit smokeless tobacco use about 11 years ago.  Her smokeless tobacco use included snuff and chew. She reports current alcohol use. She reports that she does not use drugs.   Family History:  The patient's family history includes Breast cancer in her maternal aunt, maternal grandmother, and mother; Colon cancer (age of onset: 61) in her brother; Colon polyps in her brother; Diabetes in her maternal grandfather; Heart attack in her father; Heart disease in an other family member; Heart failure in her brother and father; Kidney disease in an other family member; Other in her brother and sister; Parkinson's disease in her paternal uncle; Stroke in her mother; Transient ischemic attack in her mother.  ROS:  Please see the history of present illness.    All other systems are reviewed and otherwise negative.   PHYSICAL EXAM:  VS:  There were no vitals taken for this visit. BMI: There is no height or weight on file to calculate BMI. Well nourished, well developed, in no acute distress HEENT: normocephalic, atraumatic Neck: no JVD, carotid bruits or masses Cardiac:  *** RRR; no significant murmurs, no rubs, or gallops Lungs: *** CTA b/l, no wheezing, rhonchi or rales Abd: soft, nontender MS: no deformity or atrophy Ext: *** no edema Skin: warm and dry, no  rash Neuro:  No gross deficits appreciated Psych: euthymic mood, full affect  EKG:  done today and reviewed by myself ***    04/07/22: TTE 1. Left ventricular ejection fraction, by estimation, is 60 to 65%. The  left ventricle has normal function. The left ventricle has no regional  wall motion abnormalities. Left ventricular diastolic parameters were  normal.   2. Right ventricular systolic function is normal. The right ventricular  size is normal.   3. The mitral valve is normal in structure. No evidence of mitral valve  regurgitation. No evidence of mitral stenosis.   4. The aortic valve is normal in structure. Aortic valve regurgitation is  not visualized. No aortic stenosis is present.   5. The inferior vena cava is normal in size with greater than 50%  respiratory variability, suggesting right atrial pressure of 3 mmHg.    02/05/2021: Coronary CT IMPRESSION: 1. 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 2. Coronary calcium score of 419. This was 92nd percentile for age and sex matched control 3. Normal coronary origin with right dominance. 4. Dilated main pulmonary artery to 27 mm, suggestive of pulmonary hypertension 5. No evidence of pulmonary vein stenosis given prior ablation 6. Aortic atherosclerosis.     12/07/2020: TTE  1. Left ventricular ejection fraction by 3D volume is 67 %. The left  ventricle has normal function. The left ventricle has no regional wall  motion abnormalities. Left ventricular diastolic parameters were normal.   2. Right ventricular systolic function is normal. The right ventricular  size is normal. There is normal pulmonary artery systolic pressure.   3. The mitral valve is normal in structure. Trivial mitral valve  regurgitation. No evidence of mitral stenosis.   4. The aortic valve is normal in structure. Aortic valve regurgitation is  not visualized. No aortic stenosis is present.   5. The inferior vena cava is normal in  size with greater than 50%  respiratory variability, suggesting right atrial pressure of 3 mmHg.    05/25/2018; stress myoview Nuclear stress EF: 67%. Normal perfusion This is a low risk study.    08/03/2016: EPS/Ablation CONCLUSIONS: 1. Sinus rhythm upon presentation.   2. Successful electrical isolation and anatomical encircling of all four pulmonary veins with radiofrequency current. 3. No inducible arrhythmias following ablation both on and off of dobutamine 4. No early apparent complications  TTE 02/22/16  Review of the above records today demonstrates:  - Left ventricle: The cavity size was normal. Systolic function was   normal. The estimated ejection fraction was in the range of 55%   to 60%. Left ventricular diastolic function parameters were   normal.   02/23/16 SPECT The left ventricular ejection fraction is normal (55-65%). Nuclear stress EF: 60%. There was no ST segment deviation noted during stress. No T wave inversion was noted during stress. Defect 1: There is a small defect of moderate severity present in the apical anterior and apex location. Consistent with breast attenuation and unchanged from 2015. The study is normal. This is a low risk study.  Recent Labs: 07/05/2022: BUN 18; Creatinine, Ser 0.62; Hemoglobin 12.2; Platelets 178; Potassium 3.6; Sodium 137  No results found for requested labs within last 365 days.   CrCl cannot be calculated (Patient's most recent lab result is older than the maximum 21 days allowed.).   Wt Readings from Last 3 Encounters:  06/01/23 146 lb 6.4 oz (66.4 kg)  07/14/22 157 lb (71.2 kg)  07/05/22 157 lb (71.2 kg)     Other studies reviewed: Additional studies/records reviewed today include: summarized  above  ASSESSMENT AND PLAN:  Paroxysmal Afib CHA2DS2Vasc is 5, on Eliquis, *** appropriately dosed *** Minimal if any burden by symptoms  HTN *** Looks ok  Takotsubo CM *** EF remains recovered by last echo *** No  symptoms to suggest any clinical change  4. CP No obstructive CAD *** Not an ongoing complaint *** Has her labs/lipids managed by her PMD Deferred to PMD and gen cardiology team    Disposition: *** F/u with [place] annually, sooner if needed  Current medicines are reviewed at length with the patient today.  The patient did not have any concerns regarding medicines.  Norma Fredrickson, PA-C 06/07/2023 7:44 AM     [facility] [address] [place] [address] [phone] (office)  [phone] (fax)

## 2023-06-08 ENCOUNTER — Ambulatory Visit: Payer: Medicare Other | Admitting: Physician Assistant

## 2023-06-19 ENCOUNTER — Other Ambulatory Visit: Payer: Self-pay

## 2023-06-19 ENCOUNTER — Emergency Department (HOSPITAL_BASED_OUTPATIENT_CLINIC_OR_DEPARTMENT_OTHER)
Admission: EM | Admit: 2023-06-19 | Discharge: 2023-06-20 | Disposition: A | Discharge status: Home / Self Care | Payer: Medicare Other | Attending: Emergency Medicine | Admitting: Emergency Medicine

## 2023-06-19 ENCOUNTER — Emergency Department (HOSPITAL_BASED_OUTPATIENT_CLINIC_OR_DEPARTMENT_OTHER): Payer: Medicare Other

## 2023-06-19 DIAGNOSIS — W19XXXA Unspecified fall, initial encounter: Secondary | ICD-10-CM | POA: Insufficient documentation

## 2023-06-19 DIAGNOSIS — S3991XA Unspecified injury of abdomen, initial encounter: Secondary | ICD-10-CM | POA: Diagnosis not present

## 2023-06-19 DIAGNOSIS — S0990XA Unspecified injury of head, initial encounter: Secondary | ICD-10-CM | POA: Diagnosis not present

## 2023-06-19 DIAGNOSIS — Z7901 Long term (current) use of anticoagulants: Secondary | ICD-10-CM | POA: Insufficient documentation

## 2023-06-19 DIAGNOSIS — S79912A Unspecified injury of left hip, initial encounter: Secondary | ICD-10-CM | POA: Insufficient documentation

## 2023-06-19 LAB — COMPREHENSIVE METABOLIC PANEL
ALT: 19 U/L (ref 0–44)
AST: 17 U/L (ref 15–41)
Albumin: 4.4 g/dL (ref 3.5–5.0)
Alkaline Phosphatase: 58 U/L (ref 38–126)
Anion gap: 9 (ref 5–15)
BUN: 19 mg/dL (ref 8–23)
CO2: 28 mmol/L (ref 22–32)
Calcium: 10.2 mg/dL (ref 8.9–10.3)
Chloride: 103 mmol/L (ref 98–111)
Creatinine, Ser: 0.7 mg/dL (ref 0.44–1.00)
GFR, Estimated: >60 mL/min (ref >60)
Glucose, Bld: 157 mg/dL — ABNORMAL HIGH (ref 70–99)
Potassium: 3.6 mmol/L (ref 3.5–5.1)
Sodium: 140 mmol/L (ref 135–145)
Total Bilirubin: 0.2 mg/dL (ref <1.2)
Total Protein: 6.9 g/dL (ref 6.5–8.1)

## 2023-06-19 LAB — CBC WITH DIFFERENTIAL/PLATELET
Abs Immature Granulocytes: 0.01 10*3/uL (ref 0.00–0.07)
Basophils Absolute: 0 10*3/uL (ref 0.0–0.1)
Basophils Relative: 0 %
Eosinophils Absolute: 0.2 10*3/uL (ref 0.0–0.5)
Eosinophils Relative: 3 %
HCT: 38.6 % (ref 36.0–46.0)
Hemoglobin: 12.9 g/dL (ref 12.0–15.0)
Immature Granulocytes: 0 %
Lymphocytes Relative: 30 %
Lymphs Abs: 1.5 10*3/uL (ref 0.7–4.0)
MCH: 30.1 pg (ref 26.0–34.0)
MCHC: 33.4 g/dL (ref 30.0–36.0)
MCV: 90.2 fL (ref 80.0–100.0)
Monocytes Absolute: 0.6 10*3/uL (ref 0.1–1.0)
Monocytes Relative: 11 %
Neutro Abs: 2.9 10*3/uL (ref 1.7–7.7)
Neutrophils Relative %: 56 %
Platelets: 176 10*3/uL (ref 150–400)
RBC: 4.28 MIL/uL (ref 3.87–5.11)
RDW: 14.6 % (ref 11.5–15.5)
WBC: 5.2 10*3/uL (ref 4.0–10.5)
nRBC: 0 % (ref 0.0–0.2)

## 2023-06-19 LAB — PROTIME-INR
INR: 1 (ref 0.8–1.2)
Prothrombin Time: 13.2 s (ref 11.4–15.2)

## 2023-06-19 MED ORDER — FENTANYL CITRATE PF 50 MCG/ML IJ SOSY
25.0000 ug | PREFILLED_SYRINGE | Freq: Once | INTRAMUSCULAR | Status: AC
Start: 2023-06-19 — End: 2023-06-19
  Administered 2023-06-19: 25 ug via INTRAVENOUS
  Filled 2023-06-19: qty 1

## 2023-06-19 NOTE — ED Notes (Signed)
Pt transported to CT via stretcher.  

## 2023-06-19 NOTE — ED Triage Notes (Signed)
C/o of left hit pain from mechanical fall on Friday night. Was seen at [facility] today and was called and told she had a "bad crack in R hip".

## 2023-06-19 NOTE — ED Provider Notes (Signed)
[place] EMERGENCY DEPARTMENT AT [facility] Provider Note   CSN: [id number] Arrival date & time: 06/19/23  2223     History {Add pertinent medical, surgical, social history, OB history to HPI:1} Chief Complaint  Patient presents with   Hip Pain    [name] [name] is a 72 y.o. female.  Patient to ED for evaluation of left hip pain after fall 3 days ago. She reports her only injury involves the left hip which is causing significant pain. She denies hitting her head. No neck pain, chest/abdominal pain, or other extremity injury. She is anticoagulated on Eliquis. Seen at Urgent Care today who called her at home with report of hip fracture.   The history is provided by the patient and a relative. No language interpreter was used.  Hip Pain       Home Medications Prior to Admission medications   Medication Sig Start Date End Date Taking? Authorizing Provider  acetaminophen (TYLENOL) 500 MG tablet Take 500-1,000 mg by mouth every 6 (six) hours as needed (pain).    [provider]  albuterol (PROVENTIL) (2.5 MG/3ML) 0.083% nebulizer solution Take 3 mLs (2.5 mg total) by nebulization every 6 (six) hours as needed for wheezing. DX  491.9, J44.9 01/24/19   Jetty Duhamel D, MD  ALPRAZolam Prudy Feeler) 1 MG tablet Take 1 mg by mouth in the morning, at noon, and at bedtime.    [provider]  atorvastatin (LIPITOR) 80 MG tablet Take 80 mg by mouth at bedtime.     [provider]  azelastine (ASTELIN) 0.1 % nasal spray Place 1 spray into both nostrils at bedtime as needed for allergies.    [provider]  beta carotene 62130 UNIT capsule Take 25,000 Units by mouth 3 (three) times a week. At night    [provider]  cholecalciferol (VITAMIN D) 1000 UNITS tablet Take 1,000 Units by mouth every evening.    [provider]  cyclobenzaprine (FLEXERIL) 10 MG tablet Take 1 tablet (10 mg total) by mouth at bedtime as needed  (pain). Patient not taking: Reported on 06/01/2023 07/14/22   Shuford, French Ana, PA-C  denosumab (PROLIA) 60 MG/ML SOSY injection Inject 60 mg into the skin every 6 (six) months. 02/24/22   [provider]  dexlansoprazole (DEXILANT) 60 MG capsule Take 60 mg by mouth daily before breakfast.    [provider]  docusate sodium (COLACE) 250 MG capsule Take 250 mg by mouth in the morning, at noon, and at bedtime.    [provider]  ELIQUIS 5 MG TABS tablet TAKE 1 TABLET BY MOUTH TWICE DAILY 07/27/21   Corky Crafts, MD  EPINEPHrine (EPIPEN 2-PAK) 0.3 mg/0.3 mL IJ SOAJ injection USE AS DIRECTED 01/24/19   Young, Clinton D, MD  Flaxseed, Linseed, (FLAXSEED OIL) 1200 MG CAPS Take 1,200 mg by mouth at bedtime.    [provider]  furosemide (LASIX) 20 MG tablet TAKE ONE TABLET ONCE DAILY 04/13/23   Corky Crafts, MD  glipiZIDE (GLUCOTROL) 5 MG tablet Take 5 mg by mouth 2 (two) times daily before a meal.    [provider]  isosorbide mononitrate (IMDUR) 30 MG 24 hr tablet Take 1 tablet (30 mg total) by mouth daily. Please call [phone] to schedule an overdue appointment with Dr. Loman Brooklyn for future refills. Thank you final attempt. 02/24/23   Regan Lemming, MD  losartan (COZAAR) 25 MG tablet TAKE 1/2 TABLET AT BEDTIME 05/11/23   Lance Muss S,  MD  montelukast (SINGULAIR) 10 MG tablet Take 10 mg by mouth in the morning.    [provider]  Multiple Vitamin (MULTIVITAMIN WITH MINERALS) TABS tablet Take 1 tablet by mouth every evening. Centrum Silver    [provider]  mupirocin ointment (BACTROBAN) 2 % Apply 2 application  topically 2 (two) times daily as needed (wounds). 01/06/21   [provider]  PROAIR HFA 108 (90 Base) MCG/ACT inhaler INHALE TWO PUFFS INTO THE LUNGS EVERY 6 HOURS AS NEEDED FOR WHEEZING OR SHORTNESS OF BREATH 12/14/17   Young, Joni Fears D, MD  vitamin B-12 (CYANOCOBALAMIN) 1000 MCG tablet  Take 1,000 mcg by mouth every evening.     [provider]      Allergies    Bee venom, Ceclor [cefaclor], Penicillins, Pneumococcal vaccines, Codeine, Cortisone, and Boniva [ibandronate sodium]    Review of Systems   Review of Systems  Physical Exam Updated Vital Signs BP (!) 193/79 (BP Location: Right Arm)   Pulse 74   Temp 98.3 F (36.8 C)   Resp 20   Ht 5\' 1"  (1.549 m)   Wt 66.4 kg   SpO2 99%   BMI 27.66 kg/m  Physical Exam Vitals and nursing note reviewed.  Constitutional:      Appearance: Normal appearance.  HENT:     Head: Normocephalic and atraumatic.     Mouth/Throat:     Mouth: Mucous membranes are moist.  Cardiovascular:     Rate and Rhythm: Normal rate and regular rhythm.  Pulmonary:     Effort: Pulmonary effort is normal.     Breath sounds: No wheezing or rhonchi.  Abdominal:     General: There is no distension.     Palpations: Abdomen is soft.     Tenderness: There is no abdominal tenderness.  Musculoskeletal:     Cervical back: Normal range of motion and neck supple.     Comments: Left  Neurological:     Mental Status: She is alert.     ED Results / Procedures / Treatments   Labs (all labs ordered are listed, but only abnormal results are displayed) Labs Reviewed  CBC WITH DIFFERENTIAL/PLATELET  COMPREHENSIVE METABOLIC PANEL  PROTIME-INR    EKG None  Radiology No results found.  Procedures Procedures  {Document cardiac monitor, telemetry assessment procedure when appropriate:1}  Medications Ordered in ED Medications  fentaNYL (SUBLIMAZE) injection 25 mcg (has no administration in time range)    ED Course/ Medical Decision Making/ A&P   {   Click here for ABCD2, HEART and other calculatorsREFRESH Note before signing :1}                              Medical Decision Making Amount and/or Complexity of Data Reviewed Labs: ordered. Radiology: ordered.  Risk Prescription drug management.   ***  {Document critical  care time when appropriate:1} {Document review of labs and clinical decision tools ie heart score, Chads2Vasc2 etc:1}  {Document your independent review of radiology images, and any outside records:1} {Document your discussion with family members, caretakers, and with consultants:1} {Document social determinants of health affecting pt's care:1} {Document your decision making why or why not admission, treatments were needed:1} Final Clinical Impression(s) / ED Diagnoses Final diagnoses:  None    Rx / DC Orders ED Discharge Orders     None

## 2023-06-20 ENCOUNTER — Emergency Department (HOSPITAL_BASED_OUTPATIENT_CLINIC_OR_DEPARTMENT_OTHER): Payer: Medicare Other

## 2023-06-20 MED ORDER — FENTANYL CITRATE PF 50 MCG/ML IJ SOSY
50.0000 ug | PREFILLED_SYRINGE | Freq: Once | INTRAMUSCULAR | Status: AC
  Administered 2023-06-20: 50 ug via INTRAVENOUS
  Filled 2023-06-20: qty 1

## 2023-06-20 NOTE — ED Notes (Signed)
Pt ambulates in hallway with steady gait, SBA.

## 2023-06-20 NOTE — ED Provider Notes (Signed)
  Physical Exam  BP 139/80   Pulse (!) 59   Temp 98 F (36.7 C) (Oral)   Resp 15   Ht 5\' 1"  (1.549 m)   Wt 66.4 kg   SpO2 100%   BMI 27.66 kg/m   Physical Exam  Procedures  Procedures  ED Course / MDM   Clinical Course as of 06/20/23 0254  Tue Jun 20, 2023  0035 Patient to ED with concern for left hip fracture per Urgent Care report today. Unclear if there is a nondisplaced fracture on xray or what appears to be overlying trochanter. Pending radiology read. CT hip ordered by Dr. Karene Fry. Pending at time of sign out at end of shift.  [SU]    Clinical Course User Index [SU] Elpidio Anis, PA-C   Medical Decision Making Amount and/or Complexity of Data Reviewed Labs: ordered. Radiology: ordered.  Risk Prescription drug management.   72 year old female presenting to the emergency department after a fall, complaining of back pain, pelvis pain and hip pain.  Had been told that she had a hip fracture based on outpatient imaging.  CT imaging performed of the hip negative for acute fracture or dislocation.  Given the location of the patient's pain, considered lower lumbar spine fracture versus pelvic fracture.  Will obtain CT of the pelvis and lumbar spine and reassess.  CT imaging of the head have been performed as the patient was anticoagulated on Eliquis and was unremarkable.  CT Pelvis:Neg CT Lumbar Spine: Neg  The patient was ambulatory in the emergency department, no evidence of fracture or dislocation seen on extensive CT imaging.  Patient advised Tylenol for pain control, outpatient follow-up with her primary care provider.       Ernie Avena, MD 06/20/23 [phone]

## 2023-06-20 NOTE — ED Notes (Signed)
Pt transported to CT via stretcher.  

## 2023-06-20 NOTE — Discharge Instructions (Signed)
Was no evidence of fracture or dislocation seen on your CT imaging.  Recommend you follow-up outpatient with your primary care provider.  Take Tylenol for pain control.

## 2023-06-27 NOTE — Progress Notes (Unsigned)
Electrophysiology Office Note:   Date:  06/28/2023  ID:  [name] [name], DOB Dec 24, 1950, MRN [id number]  Primary Cardiologist: Lance Muss, MD Electrophysiologist: Regan Lemming, MD      History of Present Illness:   [name] [name] is a 72 y.o. female with history of COPD, HTN, HLD, DM, CVA, essential tremor, GERD, Takotsubo CM with recovered EF, AFib s/p ablation  Echo looked OK, LVEF 67%, no WMA, no significant VHD  CT with mild NOD, over-read also looked ok  She Dr. Eldridge Dace again 03/17/22 for some chest discomfort, prior CTa coronaries reassuring, suspect 2/2 stress. Slight change to her EKG and planned to update her echo  Normal LV/RV/valvular function   Seen in ED last week after fall. Was putting up curtains and lost her balance. ""Bruised" her tail bone but otherwise doing well.   Overall she is feeling well. Has rare palpitations but is overall happy with control. No bleeding issues on OAC. No syncope, edema, or CP. Has mild SOB with moderate or more exertion.    EP Information / Studies Reviewed:    EKG is ordered today. Personal review as below.  EKG Interpretation Date/Time:  Wednesday June 28 2023 11:29:32 EST Ventricular Rate:  63 PR Interval:  140 QRS Duration:  96 QT Interval:  404 QTC Calculation: 413 R Axis:   -9  Text Interpretation: Normal sinus rhythm Septal infarct , age undetermined When compared with ECG of 19-Jun-2023 23:41, PREVIOUS ECG IS PRESENT Confirmed by Maxine Glenn [phone]) on 06/28/2023 11:54:26 AM EKG Interpretation Date/Time:  Wednesday June 28 2023 11:29:32 EST Ventricular Rate:  63 PR Interval:  140 QRS Duration:  96 QT Interval:  404 QTC Calculation: 413 R Axis:   -9  Text Interpretation: Normal sinus rhythm Septal infarct , age undetermined When compared with ECG of 19-Jun-2023 23:41, PREVIOUS ECG IS PRESENT Confirmed by Maxine Glenn [id number]) on 06/28/2023 11:54:26 AM    AFib/AAD  Hx Diagnosed post-op knee surgery, complicated by flash edema 2015 Multaq 2017 failed to maintain SR Flecainide Nov 2017 > stopped post ablation (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 2. Coronary calcium score of 419. This was 92nd percentile for age and sex matched control 3. Normal coronary origin with right dominance. 4. Dilated main pulmonary artery to 27 mm, suggestive of pulmonary hypertension 5. No evidence of pulmonary vein stenosis given prior ablation 6. Aortic atherosclerosis.   Echo 12/07/2020:  LVEF 67%, normal RV, trivial MR   05/25/2018; stress myoview Nuclear stress EF: 67%. Normal perfusion This is a low risk study.  08/03/2016: EPS/Ablation CONCLUSIONS: 1. Sinus rhythm upon presentation.   2. Successful electrical isolation and anatomical encircling of all four pulmonary veins with radiofrequency current. 3. No inducible arrhythmias following ablation both on and off of dobutamine 4. No early apparent complications  TTE 02/22/16  Review of the above records today demonstrates:  - Left ventricle: The cavity size was normal. Systolic function was   normal. The estimated ejection fraction was in the range of 55%   to 60%. Left ventricular diastolic function parameters were   normal.   02/23/16 SPECT The left ventricular ejection fraction is normal (55-65%). Nuclear stress EF: 60%. There was no ST segment deviation noted during stress. No T wave inversion was noted during stress. Defect 1: There is a  small defect of moderate severity present in the apical anterior and apex location. Consistent with breast attenuation and unchanged from 2015. The study is normal. This is a low risk study.  Physical Exam:   VS:  BP 132/64   Pulse 64   Ht 5\' 1"  (1.549 m)   Wt 140 lb (63.5 kg)   SpO2  96%   BMI 26.45 kg/m    Wt Readings from Last 3 Encounters:  06/28/23 140 lb (63.5 kg)  06/19/23 146 lb 6.2 oz (66.4 kg)  06/01/23 146 lb 6.4 oz (66.4 kg)     GEN: Well nourished, well developed in no acute distress NECK: No JVD; No carotid bruits CARDIAC: Regular rate and rhythm, no murmurs, rubs, gallops RESPIRATORY:  Clear to auscultation without rales, wheezing or rhonchi  ABDOMEN: Soft, non-tender, non-distended EXTREMITIES:  No edema; No deformity   ASSESSMENT AND PLAN:    Paroxysmal Afib Recent EKG with NSR S/p ablation 08/2016 Continue CHA2DS2Vasc is 5 on Eliquis 5 mg BID Rare palpitations, happy with current control  Takotsubo CM EF remains recovered by prior echo No symptoms to suggest any clinical change  HTN Stable on current regimen   Chest pain, atypical No obstructive CAD by prior work up Not a current complaint.  Lipids per PCP and gen cards  Follow up with Dr. Elberta Fortis in 12 months  Baldwin Crown" [place], [place]  06/28/2023 11:55 AM

## 2023-06-28 ENCOUNTER — Encounter: Payer: Self-pay | Admitting: Student

## 2023-06-28 ENCOUNTER — Ambulatory Visit: Payer: Medicare Other | Attending: Student | Admitting: Student

## 2023-06-28 VITALS — BP 132/64 | HR 64 | Ht 61.0 in | Wt 140.0 lb

## 2023-06-28 DIAGNOSIS — I480 Paroxysmal atrial fibrillation: Secondary | ICD-10-CM | POA: Diagnosis not present

## 2023-06-28 DIAGNOSIS — I5181 Takotsubo syndrome: Secondary | ICD-10-CM

## 2023-06-28 DIAGNOSIS — I10 Essential (primary) hypertension: Secondary | ICD-10-CM

## 2023-06-28 NOTE — Patient Instructions (Signed)
Medication Instructions:  Your physician recommends that you continue on your current medications as directed. Please refer to the Current Medication list given to you today.  *If you need a refill on your cardiac medications before your next appointment, please call your pharmacy*  Lab Work: None If you have labs (blood work) drawn today and your tests are completely normal, you will receive your results only by: MyChart Message (if you have MyChart) OR A paper copy in the mail If you have any lab test that is abnormal or we need to change your treatment, we will call you to review the results.  Follow-Up: At [facility], [place], you and your health needs are our priority.  As part of our continuing mission to provide you with exceptional heart care, we have created designated Provider Care Teams.  These Care Teams include your primary Cardiologist (physician) and Advanced Practice Providers (APPs -  Physician Assistants and Nurse Practitioners) who all work together to provide you with the care you need, when you need it.  We recommend signing up for the patient portal called "MyChart".  Sign up information is provided on this After Visit Summary.  MyChart is used to connect with patients for Virtual Visits (Telemedicine).  Patients are able to view lab/test results, encounter notes, upcoming appointments, etc.  Non-urgent messages can be sent to your provider as well.   To learn more about what you can do with MyChart, go to [email or web address].    Your next appointment:   1 year(s)  Provider:   Loman Brooklyn, MD

## 2023-07-14 ENCOUNTER — Other Ambulatory Visit: Payer: Self-pay | Admitting: Interventional Cardiology

## 2023-08-21 ENCOUNTER — Other Ambulatory Visit: Payer: Self-pay | Admitting: Cardiology

## 2023-09-29 ENCOUNTER — Ambulatory Visit: Payer: Medicare Other | Admitting: Cardiology

## 2023-10-13 ENCOUNTER — Other Ambulatory Visit (HOSPITAL_COMMUNITY): Payer: Self-pay

## 2023-10-16 ENCOUNTER — Ambulatory Visit (HOSPITAL_COMMUNITY)
Admission: RE | Admit: 2023-10-16 | Discharge: 2023-10-16 | Disposition: A | Discharge status: Home / Self Care | Source: Ambulatory Visit | Attending: Internal Medicine | Admitting: Internal Medicine

## 2023-10-16 DIAGNOSIS — M810 Age-related osteoporosis without current pathological fracture: Secondary | ICD-10-CM | POA: Diagnosis present

## 2023-10-16 MED ORDER — DENOSUMAB 60 MG/ML ~~LOC~~ SOSY
PREFILLED_SYRINGE | SUBCUTANEOUS | Status: AC
  Filled 2023-10-16: qty 1

## 2023-10-16 MED ORDER — DENOSUMAB 60 MG/ML ~~LOC~~ SOSY
60.0000 mg | PREFILLED_SYRINGE | Freq: Once | SUBCUTANEOUS | Status: AC
  Administered 2023-10-16: 60 mg via SUBCUTANEOUS

## 2023-11-10 ENCOUNTER — Other Ambulatory Visit: Payer: Self-pay | Admitting: Interventional Cardiology

## 2024-01-12 ENCOUNTER — Ambulatory Visit: Attending: Emergency Medicine | Admitting: Emergency Medicine

## 2024-01-12 ENCOUNTER — Encounter: Payer: Self-pay | Admitting: Emergency Medicine

## 2024-01-12 VITALS — BP 120/80 | HR 66 | Ht 61.0 in | Wt 135.0 lb

## 2024-01-12 DIAGNOSIS — I10 Essential (primary) hypertension: Secondary | ICD-10-CM

## 2024-01-12 DIAGNOSIS — I2510 Atherosclerotic heart disease of native coronary artery without angina pectoris: Secondary | ICD-10-CM

## 2024-01-12 DIAGNOSIS — I5181 Takotsubo syndrome: Secondary | ICD-10-CM

## 2024-01-12 DIAGNOSIS — I480 Paroxysmal atrial fibrillation: Secondary | ICD-10-CM | POA: Diagnosis not present

## 2024-01-12 DIAGNOSIS — R079 Chest pain, unspecified: Secondary | ICD-10-CM | POA: Diagnosis not present

## 2024-01-12 DIAGNOSIS — E785 Hyperlipidemia, unspecified: Secondary | ICD-10-CM

## 2024-01-12 MED ORDER — METOPROLOL TARTRATE 100 MG PO TABS: ORAL_TABLET | ORAL | 0 refills | Status: DC

## 2024-01-12 NOTE — Patient Instructions (Addendum)
 Medication Instructions:  TAKE METOPROLOL  TARTRATE 100 MG (1 TABLET) 2 HOURS BEFORE CTA SCAN.   Lab Work: Nutritional therapist AND CBC TO BE DONE TODAY.   Testing/Procedures:   Your cardiac CT will be scheduled at one of the below locations:   Jeralene Mom. [facility] and V[facility] Tower [address]  [place], [place] [address] Opening November 27, 2023   If scheduled at the Heart and Vascular Tower at [employer], please enter the parking lot using the [address] street entrance and use the FREE valet service at the patient drop-off area. Enter the buidling and check-in with registration on the main floor.  Please follow these instructions carefully (unless otherwise directed):  An IV will be required for this test and Nitroglycerin  will be given.   On the Night Before the Test: Be sure to Drink plenty of water . Do not consume any caffeinated/decaffeinated beverages or chocolate 12 hours prior to your test. Do not take any antihistamines 12 hours prior to your test.  On the Day of the Test: Drink plenty of water  until 1 hour prior to the test. Do not eat any food 1 hour prior to test. You may take your regular medications prior to the test.  Take metoprolol  (Lopressor ) two hours prior to test. If you take Furosemide /Hydrochlorothiazide/Spironolactone/Chlorthalidone, please HOLD on the morning of the test. Patients who wear a continuous glucose monitor MUST remove the device prior to scanning. FEMALES- please wear underwire-free bra if available, avoid dresses & tight clothing       After the Test: Drink plenty of water . After receiving IV contrast, you may experience a mild flushed feeling. This is normal. On occasion, you may experience a mild rash up to 24 hours after the test. This is not dangerous. If this occurs, you can take Benadryl  25 mg, Zyrtec, Claritin, or Allegra and increase your fluid intake. (Patients taking Tikosyn should avoid Benadryl , and may take Zyrtec, Claritin, or  Allegra) If you experience trouble breathing, this can be serious. If it is severe call 911 IMMEDIATELY. If it is mild, please call our office.  We will call to schedule your test 2-4 weeks out understanding that some insurance companies will need an authorization prior to the service being performed.   For more information and frequently asked questions, please visit our website : [email or web address]  For non-scheduling related questions, please contact the cardiac imaging nurse navigator should you have any questions/concerns: Cardiac Imaging Nurse Navigators Direct Office Dial: [phone]   For scheduling needs, including cancellations and rescheduling, please call [place], [phone].   Follow-Up: At [facility], you and your health needs are our priority.  As part of our continuing mission to provide you with exceptional heart care, our providers are all part of one team.  This team includes your primary Cardiologist (physician) and Advanced Practice Providers or APPs (Physician Assistants and Nurse Practitioners) who all work together to provide you with the care you need, when you need it.  Your next appointment:   6 WEEKS POST TESTING  Provider:   MADISON FOUNTAIN, DNP  We recommend signing up for the patient portal called MyChart.  Sign up information is provided on this After Visit Summary.  MyChart is used to connect with patients for Virtual Visits (Telemedicine).  Patients are able to view lab/test results, encounter notes, upcoming appointments, etc.  Non-urgent messages can be sent to your provider as well.   To learn more about what you can do with MyChart, go to [email or web address].

## 2024-01-12 NOTE — Progress Notes (Unsigned)
 Cardiology Office Note:    Date:  01/12/2024  ID:  [name] [name], DOB March 30, 1951, MRN [id number] PCP: Barnetta Liberty, MD  [facility] Providers Cardiologist:  Avery Bodo, MD Electrophysiologist:  Will Cortland Ding, MD { Click to update primary MD,subspecialty MD or APP then REFRESH:1}    {Click to Open Review  :1}   Patient Profile:       Chief Complaint: Acute visit for chest pains History of Present Illness:  [name] [name] is a 73 y.o. female with visit-pertinent history of tachycardia cardiomyopathy, coronary artery calcification, atrial fibrillation, hypertension, COPD, hyperlipidemia, type 2 diabetes, CVA, GERD  She developed acute systolic congestive heart failure following her leg surgery in Nov, 2015. 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  Her EF was low as 15-20%. She has improved significantly and now her EF has normallized   She is status post atrial fibrillation ablation on 08/03/2016.  Lexiscan  Myoview  05/2018 was normal low risk study.  Coronary CTA 01/2021 with coronary calcium  score 419 (92nd percentile).  Echocardiogram 04/2022 with LVEF 60 to 65%, no RWMA, normal diastolic parameters, RV function size normal, no valvular abnormalities.  Last seen in clinic on 06/28/2023 by EP.  She is maintaining NSR.  No medication changes were made.  She has a follow back up with EP in 12 months.   Discussed the use of AI scribe software for clinical note transcription with the patient, who gave verbal consent to proceed.  Today patient presents office by herself.  She arrives office with complaints of chest pain.  She began experiencing chest pain last month while mowing the yard. The pain was severe, located in the middle of her chest, and radiated to her back. It was accompanied by shortness of breath and cold sweats. The pain improved with rest and cessation of exertion. Yesterday, she experienced another episode of chest  pain while push mowing, which was less severe. The pain was described as a tightness radiating to her back, accompanied by shortness of breath, particularly in high humidity.  The pain lasted several minutes until she rested.  She lives alone but maintains an active lifestyle, engaging in activities such as mowing, pressure washing, quilting, crocheting, and participating in church events. She has a supportive family network, with her brother living nearby and her mother a short distance away.   She denies any dyspnea on exertion, orthopnea, PND, leg swelling, syncope, presyncope, lightheadedness, dizziness.  Review of systems:  Please see the history of present illness. All other systems are reviewed and otherwise negative.      Studies Reviewed:    EKG Interpretation Date/Time:  Friday January 12 2024 14:37:38 EDT Ventricular Rate:  66 PR Interval:  138 QRS Duration:  100 QT Interval:  438 QTC Calculation: 459 R Axis:   -36  Text Interpretation: Normal sinus rhythm Left axis deviation Moderate voltage criteria for LVH, may be normal variant ( R in aVL , Cornell product ) Septal infarct (cited on or before 28-Jun-2023) When compared with ECG of 28-Jun-2023 11:29, No significant change was found Confirmed by Palmer Bobo [id number]) on 01/12/2024 2:49:01 PM    Echocardiogram 04/07/2022 1. Left ventricular ejection fraction, by estimation, is 60 to 65%. The  left ventricle has normal function. The left ventricle has no regional  wall motion abnormalities. Left ventricular diastolic parameters were  normal.   2. Right ventricular systolic function is normal. The right ventricular  size is normal.   3. The mitral  valve is normal in structure. No evidence of mitral valve  regurgitation. No evidence of mitral stenosis.   4. The aortic valve is normal in structure. Aortic valve regurgitation is  not visualized. No aortic stenosis is present.   5. The inferior vena cava is normal in size with  greater than 50%  respiratory variability, suggesting right atrial pressure of 3 mmHg.   Coronary CTA 02/05/2021 1. 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   2. Coronary calcium  score of 419. This was 92nd percentile for age and sex matched control.   3. Normal coronary origin with right dominance.   4. Dilated main pulmonary artery to 27 mm, suggestive of pulmonary hypertension.   5. No evidence of pulmonary vein stenosis given prior ablation   6. Aortic atherosclerosis.  ZIO 05/25/2018 Sinus rhythm with sinus tachycardia.   Patient triggers were associated with sinus rhythm. Short runs of SVT, maximum 20 beats without symptoms Risk Assessment/Calculations:    CHA2DS2-VASc Score = 8  {Confirm score is correct.  If not, click here to update score.  REFRESH note.  :1} This indicates a 10.8% annual risk of stroke. The patient's score is based upon: CHF History: 1 HTN History: 1 Diabetes History: 1 Stroke History: 2 Vascular Disease History: 1 Age Score: 1 Gender Score: 1   {This patient has a significant risk of stroke if diagnosed with atrial fibrillation.  Please consider VKA or DOAC agent for anticoagulation if the bleeding risk is acceptable.   You can also use the SmartPhrase .[email or web address] for documentation.   :[id number]}          Physical Exam:   VS:  BP 120/80 (BP Location: Right Arm, Patient Position: Sitting, Cuff Size: Normal)   Pulse 66   Ht 5' 1 (1.549 m)   Wt 135 lb (61.2 kg)   BMI 25.51 kg/m    Wt Readings from Last 3 Encounters:  01/12/24 135 lb (61.2 kg)  06/28/23 140 lb (63.5 kg)  06/19/23 146 lb 6.2 oz (66.4 kg)    GEN: Well nourished, well developed in no acute distress NECK: No JVD; No carotid bruits CARDIAC: RRR, no murmurs, rubs, gallops RESPIRATORY:  Clear to auscultation without rales, wheezing or rhonchi  ABDOMEN: Soft, non-tender, non-distended EXTREMITIES:  No edema; No acute deformity      Assessment and Plan:   Coronary artery disease Chest pain Coronary CTA 01/2021 with coronary calcium  score of 419 (92nd percentile) with minimal to mild nonobstructive mixed CAD EKG today without acute ischemic changes and no significant change since prior - She has had 2 episodes of exertional chest pains over the past month while push mowing the yard that resolved with rest.  Episodes are located in the middle of her chest and radiated to her back and described as a tightness.  Associated with brief shortness of breath and diaphoresis - Plan for coronary CTA to evaluate for obstructive CAD.  Metoprolol  100 mg to be given prior to scan - BMET and CBC today - On OAC so no ASA - Continue atorvastatin  80 mg daily, losartan  25 mg daily, and Imdur  30 mg daily  Paroxysmal atrial fibrillation S/p ablation 08/2016 EKG today shows patient is maintaining NSR - Denies any palpitations or tachycardia - Continue Eliquis  5 mg twice daily, no bleeding concerns - Managed by EP  Hypertension Blood pressure today is excellent at 120/80 - Continue current antihypertensive regimen  Takotsubo syndrome She developed diffuse coronary spasm in Takotsubo syndrome following her  knee surgery in 06/2014.  Her EF at that time was as low as 15%. Most recent echocardiogram 04/2022 with LVEF 60 to 65%, no RWMA - Today she is euvolemic and well compensated on exam  Hyperlipidemia, LDL goal <70 LDL 64 on 03/2023 and well-controlled Has been managed by PCP - Continue atorvastatin  80 mg daily     Dispo:  Return in about 6 weeks (around 02/23/2024).  Signed, Ava Boatman, NP

## 2024-01-13 LAB — BASIC METABOLIC PANEL WITH GFR
BUN/Creatinine Ratio: 27 (ref 12–28)
BUN: 19 mg/dL (ref 8–27)
CO2: 21 mmol/L (ref 20–29)
Calcium: 9.4 mg/dL (ref 8.7–10.3)
Chloride: 103 mmol/L (ref 96–106)
Creatinine, Ser: 0.7 mg/dL (ref 0.57–1.00)
Glucose: 121 mg/dL — ABNORMAL HIGH (ref 70–99)
Potassium: 3.4 mmol/L — ABNORMAL LOW (ref 3.5–5.2)
Sodium: 143 mmol/L (ref 134–144)
eGFR: 92 mL/min/{1.73_m2} (ref >59)

## 2024-01-13 LAB — CBC
Hematocrit: 41 % (ref 34.0–46.6)
Hemoglobin: 13.5 g/dL (ref 11.1–15.9)
MCH: 31 pg (ref 26.6–33.0)
MCHC: 32.9 g/dL (ref 31.5–35.7)
MCV: 94 fL (ref 79–97)
Platelets: 204 10*3/uL (ref 150–450)
RBC: 4.35 x10E6/uL (ref 3.77–5.28)
RDW: 12.8 % (ref 11.7–15.4)
WBC: 6.6 10*3/uL (ref 3.4–10.8)

## 2024-01-14 ENCOUNTER — Ambulatory Visit: Payer: Self-pay | Admitting: Emergency Medicine

## 2024-01-14 DIAGNOSIS — I10 Essential (primary) hypertension: Secondary | ICD-10-CM

## 2024-01-14 DIAGNOSIS — E785 Hyperlipidemia, unspecified: Secondary | ICD-10-CM

## 2024-01-16 MED ORDER — POTASSIUM CHLORIDE ER 10 MEQ PO TBCR
10.0000 meq | EXTENDED_RELEASE_TABLET | Freq: Every day | ORAL | 3 refills | Status: AC
Start: 2024-01-16 — End: 2024-04-15

## 2024-01-29 ENCOUNTER — Telehealth (HOSPITAL_COMMUNITY): Payer: Self-pay | Admitting: *Deleted

## 2024-01-29 NOTE — Telephone Encounter (Signed)
 Attempted to call patient regarding upcoming cardiac CT appointment. Left message on voicemail with name and callback number  Larey Brick RN Navigator Cardiac Imaging [facility] [phone] Office [phone] Cell

## 2024-01-29 NOTE — Telephone Encounter (Signed)
 Reaching out to patient to offer assistance regarding upcoming cardiac imaging study; pt verbalizes understanding of appt date/time, parking situation and where to check in, pre-test NPO status and medications ordered, and verified current allergies; name and call back number provided for further questions should they arise Johney Frame RN Navigator Cardiac Imaging [facility] [phone] office [phone] cell

## 2024-01-30 ENCOUNTER — Ambulatory Visit (HOSPITAL_COMMUNITY)
Admission: RE | Admit: 2024-01-30 | Discharge: 2024-01-30 | Disposition: A | Discharge status: Home / Self Care | Source: Ambulatory Visit | Attending: Emergency Medicine | Admitting: Emergency Medicine

## 2024-01-30 DIAGNOSIS — I2510 Atherosclerotic heart disease of native coronary artery without angina pectoris: Secondary | ICD-10-CM

## 2024-01-30 DIAGNOSIS — R079 Chest pain, unspecified: Secondary | ICD-10-CM | POA: Insufficient documentation

## 2024-01-30 MED ORDER — IOHEXOL 350 MG/ML SOLN
100.0000 mL | Freq: Once | INTRAVENOUS | Status: AC | PRN
  Administered 2024-01-30: 100 mL via INTRAVENOUS

## 2024-01-30 MED ORDER — NITROGLYCERIN 0.4 MG SL SUBL
0.8000 mg | SUBLINGUAL_TABLET | Freq: Once | SUBLINGUAL | Status: AC
  Administered 2024-01-30: 0.8 mg via SUBLINGUAL

## 2024-01-30 MED ORDER — DILTIAZEM HCL 25 MG/5ML IV SOLN: 10.0000 mg | INTRAVENOUS | Status: DC | PRN

## 2024-01-30 MED ORDER — METOPROLOL TARTRATE 5 MG/5ML IV SOLN: 10.0000 mg | Freq: Once | INTRAVENOUS | Status: DC | PRN

## 2024-01-31 ENCOUNTER — Ambulatory Visit (HOSPITAL_BASED_OUTPATIENT_CLINIC_OR_DEPARTMENT_OTHER): Payer: Self-pay | Admitting: Internal Medicine

## 2024-01-31 ENCOUNTER — Other Ambulatory Visit (HOSPITAL_BASED_OUTPATIENT_CLINIC_OR_DEPARTMENT_OTHER): Payer: Self-pay | Admitting: Internal Medicine

## 2024-01-31 ENCOUNTER — Ambulatory Visit (HOSPITAL_COMMUNITY)
Admission: RE | Admit: 2024-01-31 | Discharge: 2024-01-31 | Disposition: A | Discharge status: Home / Self Care | Source: Ambulatory Visit | Attending: Internal Medicine | Admitting: Internal Medicine

## 2024-01-31 DIAGNOSIS — R931 Abnormal findings on diagnostic imaging of heart and coronary circulation: Secondary | ICD-10-CM

## 2024-01-31 NOTE — Progress Notes (Signed)
CT sent for FFR 

## 2024-02-08 LAB — HEPATIC FUNCTION PANEL
ALT: 21 IU/L (ref 0–32)
AST: 18 IU/L (ref 0–40)
Albumin: 4.4 g/dL (ref 3.8–4.8)
Alkaline Phosphatase: 75 IU/L (ref 44–121)
Bilirubin Total: 0.3 mg/dL (ref 0.0–1.2)
Bilirubin, Direct: 0.12 mg/dL (ref 0.00–0.40)
Total Protein: 6.3 g/dL (ref 6.0–8.5)

## 2024-02-08 LAB — LIPID PANEL
Chol/HDL Ratio: 2.7 ratio (ref 0.0–4.4)
Cholesterol, Total: 118 mg/dL (ref 100–199)
HDL: 43 mg/dL (ref >39)
LDL Chol Calc (NIH): 55 mg/dL (ref 0–99)
Triglycerides: 112 mg/dL (ref 0–149)
VLDL Cholesterol Cal: 20 mg/dL (ref 5–40)

## 2024-03-01 ENCOUNTER — Ambulatory Visit: Attending: Emergency Medicine | Admitting: Emergency Medicine

## 2024-03-01 ENCOUNTER — Encounter: Payer: Self-pay | Admitting: Emergency Medicine

## 2024-03-01 VITALS — BP 110/60 | HR 68 | Ht 61.0 in | Wt 135.0 lb

## 2024-03-01 DIAGNOSIS — I5181 Takotsubo syndrome: Secondary | ICD-10-CM

## 2024-03-01 DIAGNOSIS — E785 Hyperlipidemia, unspecified: Secondary | ICD-10-CM

## 2024-03-01 DIAGNOSIS — I2510 Atherosclerotic heart disease of native coronary artery without angina pectoris: Secondary | ICD-10-CM | POA: Diagnosis not present

## 2024-03-01 DIAGNOSIS — I480 Paroxysmal atrial fibrillation: Secondary | ICD-10-CM | POA: Diagnosis not present

## 2024-03-01 DIAGNOSIS — I10 Essential (primary) hypertension: Secondary | ICD-10-CM | POA: Diagnosis not present

## 2024-03-01 NOTE — Progress Notes (Addendum)
 Cardiology Office Note:    Date:  03/01/2024  ID:  [name] [name], DOB May 03, 1951, MRN [id number] PCP: Larnell Hamilton, MD  [facility] Providers Cardiologist:  Oneil Parchment, MD Electrophysiologist:  Soyla Gladis Norton, MD       Patient Profile:       Chief Complaint: 20-month follow-up History of Present Illness:  [name] [name] is a 73 y.o. female with visit-pertinent history of tachycardia cardiomyopathy, coronary artery calcification, atrial fibrillation, hypertension, COPD, hyperlipidemia, type 2 diabetes, CVA, GERD   She developed acute systolic congestive heart failure following her leg surgery in Nov, 2015. 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  Her EF was low as 15-20%. She has improved significantly and now her EF has normallized    She is status post atrial fibrillation ablation on 08/03/2016.   Lexiscan  Myoview  05/2018 was normal low risk study.  Coronary CTA 01/2021 with coronary calcium  score 419 (92nd percentile).  Echocardiogram 04/2022 with LVEF 60 to 65%, no RWMA, normal diastolic parameters, RV function size normal, no valvular abnormalities.   Seen in clinic on 06/28/2023 by EP.  She is maintaining NSR.  No medication changes were made.  She has a follow back up with EP in 12 months.  She was last seen in office on 01/12/2024.  She reported experiencing 2 episodes of chest pains while mowing the yard.  The pain was severe, located in the middle of the chest and radiated to her back.  It was accompanied by shortness of breath and diaphoresis.  The pain did improve with rest and cessation of exertion.  Coronary CTA was ordered and completed on 01/30/2024 showing mild to positive moderate mixed nonobstructive CAD with coronary calcium  score 629 (93rd percentile) and total plaque volume of 692 of which 141 (20%) is calcified plaque and 551 (80%) is noncalcified plaque.  FFR did not show any significant discrete stenosis.   Discussed  the use of AI scribe software for clinical note transcription with the patient, who gave verbal consent to proceed.  Today patient is doing well overall.  She reports she has had 2 more episodes of chest pains while mowing her yard.  This is a total of 4 episodes over the past several months.  She denies any chest pains on exertion when she is inside or doing activities not in the heat.  She tells me she does feel like she is overworking herself in the heat.  During these episodes she may feel her heart racing but for a short period without associated lightheadedness, dizziness, syncope or presyncope.  She does reports she has chronic shortness of breath however this has been stable and nonprogressive.  She is a long-term smoker and has smoked since her teens and recently started vaping within the past 2 years to quit smoking cigarettes.   Review of systems:  Please see the history of present illness. All other systems are reviewed and otherwise negative.      Studies Reviewed:    EKG Interpretation Date/Time:  Friday March 01 2024 15:10:59 EDT Ventricular Rate:  68 PR Interval:    QRS Duration:  92 QT Interval:  408 QTC Calculation: 433 R Axis:   -39  Text Interpretation: Normal sinus rhythm with PAC Left axis deviation Incomplete right bundle branch block Minimal voltage criteria for LVH, may be normal variant ( Cornell product ) Anteroseptal infarct (cited on or before 28-Jun-2023) Confirmed by Rana Dixon [phone]) on 03/01/2024 5:02:33 PM  Coronary CTA 01/31/2024 IMPRESSION: 1. Mild to possibly moderate mixed probably non-obstructive CAD, CADRADS = 3. CT FFR will be performed and reported separately.   2. Normal coronary origin with right dominance.   3. Coronary artery calcium  score is 629, which places the patient in the 93rd percentile for age/race and sex-matched controls (MESA).   4. Total plaque volume 692 mm3, of which 141 mm3 (20%) is calcified plaque and 52mm3 (80%)  is non-calcified plaque. TPV is severe.   5. Mildly dilated main pulmonary artery to 29 mm, suggestive of possible pulmonary hypertension.   6. Aortic atherosclerosis.   7. Aggressive risk factor modification with lipid lowering to target LDL <55 is recommended, consider inflammatory evaluation and possible anti-inflammatory therapies.  1. CT FFR analysis did not show any significant discrete stenosis (<0.75).  Echocardiogram 04/07/2022 1. Left ventricular ejection fraction, by estimation, is 60 to 65%. The  left ventricle has normal function. The left ventricle has no regional  wall motion abnormalities. Left ventricular diastolic parameters were  normal.   2. Right ventricular systolic function is normal. The right ventricular  size is normal.   3. The mitral valve is normal in structure. No evidence of mitral valve  regurgitation. No evidence of mitral stenosis.   4. The aortic valve is normal in structure. Aortic valve regurgitation is  not visualized. No aortic stenosis is present.   5. The inferior vena cava is normal in size with greater than 50%  respiratory variability, suggesting right atrial pressure of 3 mmHg.  Risk Assessment/Calculations:    CHA2DS2-VASc Score = 8   This indicates a 10.8% annual risk of stroke. The patient's score is based upon: CHF History: 1 HTN History: 1 Diabetes History: 1 Stroke History: 2 Vascular Disease History: 1 Age Score: 1 Gender Score: 1             Physical Exam:   VS:  BP 110/60 (BP Location: Left Arm, Patient Position: Sitting, Cuff Size: Normal)   Pulse 68   Ht 5' 1 (1.549 m)   Wt 135 lb (61.2 kg)   SpO2 97%   BMI 25.51 kg/m    Wt Readings from Last 3 Encounters:  03/01/24 135 lb (61.2 kg)  01/12/24 135 lb (61.2 kg)  06/28/23 140 lb (63.5 kg)    GEN: Well nourished, well developed in no acute distress NECK: No JVD; No carotid bruits CARDIAC: RRR, no murmurs, rubs, gallops RESPIRATORY:  Clear to auscultation  without rales, wheezing or rhonchi  ABDOMEN: Soft, non-tender, non-distended EXTREMITIES:  No edema; No acute deformity      Assessment and Plan:  Coronary artery disease Coronary CTA 01/2024 showing CAC of 629 (93rd percentile) and TPV 692 of which 141 (20%) is calcified plaque and 551 (80%) is noncalcified plaque.  FFR analysis did not show any significant discrete stenosis EKG today without acute ischemic changes - She has had 4 episodes of exertional chest pains over the past several months while push mowing her yard that resolved with rest.  Episodes associated with brief episode of tachycardia and dyspnea.  She describes these as a tightness  - She feels as she is overworking herself in the heat.  She reports when she takes it easy she does not experience any chest pains - No exertional chest pains in climate controlled settings.  History of significant tobacco use and recently quit 3 years ago and picked up vaping - After long discussion regarding possible further evaluations patient has politely deferred further  testing.  Considered increasing her Imdur  however she prefers to monitor her symptoms for now - On OAC so no ASA - Continue atorvastatin  80 mg daily, losartan  25 mg daily, and Imdur  30 mg daily - BMET today   Paroxysmal atrial fibrillation S/p ablation 08/2016 EKG today shows NSR with PAC - She continues to maintain NSR - Denies any palpitations or chest fluttering - Continue Eliquis  5 mg twice daily, no bleeding concerns - Managed by EP   Hypertension Blood pressure today 110/60 and well-controlled - Continue current antihypertensive regimen   Takotsubo syndrome She developed diffuse coronary spasm 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  Her EF at that time was as low as 15%. Most recent echocardiogram 04/2022 with LVEF 60 to 65%, no RWMA - Today she appears euvolemic and well compensated on exam   Hyperlipidemia, LDL goal <55 LDL  55 on 01/2024 and controlled - Continue atorvastatin  80 mg daily - Work on quitting vaping      Dispo:  Return in about 3 months to reassess chest pains.  She would like to establish with new cardiologist.  I will have her establish with Dr. Jeffrie for  Signed, Lum LITTIE Louis, NP

## 2024-03-01 NOTE — Addendum Note (Signed)
 Addended by: BILLY CAMELIA CROME on: 03/01/2024 05:06 PM   Modules accepted: Orders

## 2024-03-01 NOTE — Patient Instructions (Addendum)
 Medication Instructions:  NO CHANGES  Lab Work: NONE TODAY  Testing/Procedures: NONE  Follow-Up: At [facility], you and your health needs are our priority.  As part of our continuing mission to provide you with exceptional heart care, our providers are all part of one team.  This team includes your primary Cardiologist (physician) and Advanced Practice Providers or APPs (Physician Assistants and Nurse Practitioners) who all work together to provide you with the care you need, when you need it.  Your next appointment:   3 MONTHS  Provider:   DR. JEFFRIE, MD (AS NEW PATIENT, FORMER [facility] PATIENT)  We recommend signing up for the patient portal called MyChart.  Sign up information is provided on this After Visit Summary.  MyChart is used to connect with patients for Virtual Visits (Telemedicine).  Patients are able to view lab/test results, encounter notes, upcoming appointments, etc.  Non-urgent messages can be sent to your provider as well.   To learn more about what you can do with MyChart, go to [email or web address].

## 2024-03-04 NOTE — Progress Notes (Signed)
 Order(s) created erroneously. Erroneous order ID: [id number]  Order moved by: CHART CORRECTION ANALYST SEVEN, IDENTITY  Order move date/time: 03/04/2024 12:42 PM  Source Patient: [id number]  Source Contact: 03/01/2024  Destination Patient: [id number]  Destination Contact: 01/11/2023

## 2024-04-17 ENCOUNTER — Other Ambulatory Visit: Payer: Self-pay | Admitting: Internal Medicine

## 2024-04-17 ENCOUNTER — Telehealth (HOSPITAL_COMMUNITY): Payer: Self-pay

## 2024-04-17 ENCOUNTER — Other Ambulatory Visit (HOSPITAL_COMMUNITY): Payer: Self-pay | Admitting: Internal Medicine

## 2024-04-17 DIAGNOSIS — F17200 Nicotine dependence, unspecified, uncomplicated: Secondary | ICD-10-CM

## 2024-04-17 DIAGNOSIS — M810 Age-related osteoporosis without current pathological fracture: Secondary | ICD-10-CM | POA: Insufficient documentation

## 2024-04-17 NOTE — Telephone Encounter (Signed)
 Auth Submission: APPROVED Site of care: Site of care: [facility] Payer: [facility] Medicare Medication & CPT/J Code(s) submitted: Prolia  (Denosumab ) [id number] Diagnosis Code: M81.0 Route of submission (phone, fax, portal): portal Phone # Fax # Auth type: Buy/Bill HB Units/visits requested: 60mg  q21months x 2 doses Reference number: [id number] Approval from: 04/17/24 to 04/17/25

## 2024-04-25 ENCOUNTER — Ambulatory Visit
Admission: RE | Admit: 2024-04-25 | Discharge: 2024-04-25 | Disposition: A | Discharge status: Home / Self Care | Source: Ambulatory Visit | Attending: Internal Medicine | Admitting: Internal Medicine

## 2024-04-25 DIAGNOSIS — F17200 Nicotine dependence, unspecified, uncomplicated: Secondary | ICD-10-CM

## 2024-05-16 ENCOUNTER — Ambulatory Visit (HOSPITAL_COMMUNITY)
Admission: RE | Admit: 2024-05-16 | Discharge: 2024-05-16 | Disposition: A | Discharge status: Home / Self Care | Source: Ambulatory Visit | Attending: Internal Medicine | Admitting: Internal Medicine

## 2024-05-16 VITALS — BP 120/75 | HR 58 | Temp 97.2°F | Resp 17

## 2024-05-16 DIAGNOSIS — M810 Age-related osteoporosis without current pathological fracture: Secondary | ICD-10-CM | POA: Diagnosis present

## 2024-05-16 MED ORDER — DENOSUMAB 60 MG/ML ~~LOC~~ SOSY
60.0000 mg | PREFILLED_SYRINGE | Freq: Once | SUBCUTANEOUS | Status: AC
  Administered 2024-05-16: 60 mg via SUBCUTANEOUS

## 2024-05-16 MED ORDER — DENOSUMAB 60 MG/ML ~~LOC~~ SOSY
PREFILLED_SYRINGE | SUBCUTANEOUS | Status: AC
  Filled 2024-05-16: qty 1

## 2024-05-18 ENCOUNTER — Other Ambulatory Visit: Payer: Self-pay | Admitting: Cardiology

## 2024-06-11 ENCOUNTER — Encounter: Payer: Self-pay | Admitting: Cardiology

## 2024-06-11 ENCOUNTER — Ambulatory Visit: Attending: Cardiology | Admitting: Cardiology

## 2024-06-11 VITALS — BP 136/55 | HR 65 | Ht 61.0 in | Wt 132.0 lb

## 2024-06-11 DIAGNOSIS — I5181 Takotsubo syndrome: Secondary | ICD-10-CM

## 2024-06-11 DIAGNOSIS — G459 Transient cerebral ischemic attack, unspecified: Secondary | ICD-10-CM

## 2024-06-11 DIAGNOSIS — I480 Paroxysmal atrial fibrillation: Secondary | ICD-10-CM | POA: Diagnosis not present

## 2024-06-11 DIAGNOSIS — I2510 Atherosclerotic heart disease of native coronary artery without angina pectoris: Secondary | ICD-10-CM

## 2024-06-11 DIAGNOSIS — I10 Essential (primary) hypertension: Secondary | ICD-10-CM | POA: Diagnosis not present

## 2024-06-11 DIAGNOSIS — E785 Hyperlipidemia, unspecified: Secondary | ICD-10-CM

## 2024-06-11 NOTE — Patient Instructions (Signed)
 Medication Instructions:  The current medical regimen is effective;  continue present plan and medications.  *If you need a refill on your cardiac medications before your next appointment, please call your pharmacy*  Follow-Up: At [facility], you and your health needs are our priority.  As part of our continuing mission to provide you with exceptional heart care, our providers are all part of one team.  This team includes your primary Cardiologist (physician) and Advanced Practice Providers or APPs (Physician Assistants and Nurse Practitioners) who all work together to provide you with the care you need, when you need it.  Your next appointment:   1 year(s)  Provider:   One of our Advanced Practice Providers (APPs): Morse Clause, PA-C  Lamarr Satterfield, NP Miriam Shams, NP  Olivia Pavy, PA-C Josefa Beauvais, NP  Leontine Salen, PA-C Orren Fabry, PA-C  [place], PA-C Ernest Dick, NP  Damien Braver, NP Jon Hails, PA-C  Waddell Donath, PA-C    Dayna Dunn, PA-C  Scott Weaver, PA-C Lum Louis, NP Katlyn West, NP Callie Goodrich, PA-C  Xika Zhao, NP Sheng Haley, PA-C    Kathleen Johnson, PA-C       We recommend signing up for the patient portal called MyChart.  Sign up information is provided on this After Visit Summary.  MyChart is used to connect with patients for Virtual Visits (Telemedicine).  Patients are able to view lab/test results, encounter notes, upcoming appointments, etc.  Non-urgent messages can be sent to your provider as well.   To learn more about what you can do with MyChart, go to [email or web address].

## 2024-06-11 NOTE — Progress Notes (Deleted)
  Cardiology Office Note:  .   Date:  06/11/2024  ID:  [name] [name], DOB Jan 22, 1951, MRN [id number] PCP: Larnell Hamilton, MD  [facility] Providers Cardiologist:  Oneil Parchment, MD Electrophysiologist:  Will Gladis Norton, MD { Click to update primary MD,subspecialty MD or APP then REFRESH:1}   History of Present Illness: .   [name] [name] is a 73 y.o. female Discussed the use of AI scribe software for clinical note transcription with the patient, who gave verbal consent to proceed.  History of Present Illness       ROS: ***  Studies Reviewed: .        Results  Risk Assessment/Calculations:   {Does this patient have ATRIAL FIBRILLATION?[phone]}         Physical Exam:   VS:  BP (!) 136/55   Pulse 65   Ht 5' 1 (1.549 m)   Wt 132 lb (59.9 kg)   SpO2 98%   BMI 24.94 kg/m    Wt Readings from Last 3 Encounters:  06/11/24 132 lb (59.9 kg)  03/01/24 135 lb (61.2 kg)  01/12/24 135 lb (61.2 kg)    GEN: Well nourished, well developed in no acute distress NECK: No JVD; No carotid bruits CARDIAC: ***RRR, no murmurs, no rubs, no gallops RESPIRATORY:  Clear to auscultation without rales, wheezing or rhonchi  ABDOMEN: Soft, non-tender, non-distended EXTREMITIES:  No edema; No deformity   ASSESSMENT AND PLAN: .    Assessment and Plan Assessment & Plan        {Are you ordering a CV Procedure (e.g. stress test, cath, DCCV, TEE, etc)?   Press F2        :[id number]}  Dispo: ***  Signed, Oneil Parchment, MD

## 2024-06-11 NOTE — Progress Notes (Signed)
 Cardiology Office Note:  .   Date:  06/11/2024  ID:  [name] [name], DOB 05-Mar-1951, MRN [id number] PCP: Larnell Hamilton, MD  [facility] Providers Cardiologist:  Oneil Parchment, MD Electrophysiologist:  Will Gladis Norton, MD    History of Present Illness: .   [name] [name] is a 73 y.o. female Discussed the use of AI scribe  History of Present Illness [name] [name] is a 73 year old female with coronary artery disease who presents for follow-up.  She has a history of coronary artery disease with a coronary calcium  score of 629, placing her in the 93rd percentile as of July 2025. She experiences chest pain during physical exertion, such as mowing, which necessitates stopping the activity. She is currently on atorvastatin  80 mg daily, with an LDL of 55 in July 2025.  She has paroxysmal atrial fibrillation and underwent ablation in 2018. She is on Eliquis  and experiences episodes where her heart rate increases rapidly, causing her to sit down to avoid falling. No bleeding issues are reported.  She has a history of Takotsubo syndrome following knee surgery in November 2015, with an EF as low as 15%.  Her hypertension is managed with her current medication regimen, including losartan  25 mg.  Her diabetes is managed with an A1c of 7.5.  She has a history of multiple joint replacements, including two shoulder replacements, two knee replacements, and a neck replacement, which has caused issues with metal detectors.  She is trying to quit vaping.      ROS: No CP  Studies Reviewed: .        Results LABS LDL: 55 (01/2024) Creatinine: 0.7 HbA1c: 7.5  RADIOLOGY Coronary calcium  score: 629, 93rd percentile (02/18/2024)  DIAGNOSTIC FFR analysis: No discrete significant flow-limiting disease Echocardiogram: EF 60-65%, good systolic function (04/2022) Risk Assessment/Calculations:    CHA2DS2-VASc Score = 8   This indicates a 10.8% annual risk of stroke. The  patient's score is based upon: CHF History: 1 HTN History: 1 Diabetes History: 1 Stroke History: 2 Vascular Disease History: 1 Age Score: 1 Gender Score: 1            Physical Exam:   VS:  BP (!) 136/55   Pulse 65   Ht 5' 1 (1.549 m)   Wt 132 lb (59.9 kg)   SpO2 98%   BMI 24.94 kg/m    Wt Readings from Last 3 Encounters:  06/11/24 132 lb (59.9 kg)  03/01/24 135 lb (61.2 kg)  01/12/24 135 lb (61.2 kg)    GEN: Well nourished, well developed in no acute distress NECK: No JVD; No carotid bruits CARDIAC: RRR, no murmurs, no rubs, no gallops RESPIRATORY:  Clear to auscultation without rales, wheezing or rhonchi  ABDOMEN: Soft, non-tender, non-distended EXTREMITIES:  No edema; No deformity   ASSESSMENT AND PLAN: .    Assessment and Plan Assessment & Plan Coronary artery disease without angina pectoris Coronary calcium  score of 629, 93rd percentile, indicating significant calcified plaque. FFR analysis showed no discrete significant flow-limiting disease. LDL was 55 in July 2025, indicating good cholesterol control. No major chest pains reported, though some discomfort when mowing. - Continue atorvastatin  80 mg daily - Encouraged diet and exercise  Paroxysmal atrial fibrillation, status post ablation Status post ablation in 2018. Currently on Eliquis  for stroke prevention. No bleeding reported. EKG showed normal sinus rhythm with PACs. - Continue Eliquis  for stroke prevention  Essential hypertension Hypertension is well-controlled on current regimen. - Continue losartan  25  mg daily  Type 2 diabetes mellitus without complications A1c was 7.5, indicating reasonable control. - Continue current diabetes management  History of Takotsubo syndrome Following knee surgery in 2015. EF was as low as 15% at that time. Most recent echo in 2023 showed EF of 60-65%, indicating recovery. - Continue monitoring cardiac function  Nicotine dependence (vaping) Trying to quit vaping. -  Encouraged cessation of vaping         Dispo: 1 yr APP  Signed, Oneil Parchment, MD

## 2024-07-02 ENCOUNTER — Other Ambulatory Visit: Payer: Self-pay | Admitting: Interventional Cardiology

## 2024-07-05 ENCOUNTER — Telehealth: Payer: Self-pay | Admitting: Cardiology

## 2024-07-05 MED ORDER — FUROSEMIDE 20 MG PO TABS: 20.0000 mg | ORAL_TABLET | Freq: Every day | ORAL | 3 refills | Status: AC

## 2024-07-05 NOTE — Telephone Encounter (Signed)
*  STAT* If patient is at the pharmacy, call can be transferred to refill team.   1. Which medications need to be refilled? (please list name of each medication and dose if known)   furosemide  (LASIX ) 20 MG tablet     4. Which pharmacy/location (including street and city if local pharmacy) is medication to be sent to?  [facility] - [place] - [address]     5. Do they need a 30 day or 90 day supply? 90

## 2024-07-05 NOTE — Telephone Encounter (Signed)
 Refill sent

## 2024-08-20 ENCOUNTER — Other Ambulatory Visit: Payer: Self-pay | Admitting: Cardiology

## 2024-08-20 ENCOUNTER — Other Ambulatory Visit: Payer: Self-pay | Admitting: Student

## 2024-11-15 ENCOUNTER — Encounter (HOSPITAL_COMMUNITY)
